# Patient Record
Sex: Female | Born: 1945
Health system: Southern US, Community
[De-identification: ages and names within clinical notes are randomized; demographics above are authoritative.]

## PROBLEM LIST (undated history)

## (undated) DIAGNOSIS — G8929 Other chronic pain: Secondary | ICD-10-CM

## (undated) DIAGNOSIS — N189 Chronic kidney disease, unspecified: Secondary | ICD-10-CM

## (undated) DIAGNOSIS — R42 Dizziness and giddiness: Secondary | ICD-10-CM

## (undated) DIAGNOSIS — K635 Polyp of colon: Secondary | ICD-10-CM

## (undated) DIAGNOSIS — K08109 Complete loss of teeth, unspecified cause, unspecified class: Secondary | ICD-10-CM

## (undated) DIAGNOSIS — Z8619 Personal history of other infectious and parasitic diseases: Secondary | ICD-10-CM

## (undated) DIAGNOSIS — M1990 Unspecified osteoarthritis, unspecified site: Secondary | ICD-10-CM

## (undated) DIAGNOSIS — M5440 Lumbago with sciatica, unspecified side: Secondary | ICD-10-CM

## (undated) DIAGNOSIS — Z87440 Personal history of urinary (tract) infections: Secondary | ICD-10-CM

## (undated) DIAGNOSIS — H9190 Unspecified hearing loss, unspecified ear: Secondary | ICD-10-CM

## (undated) DIAGNOSIS — E785 Hyperlipidemia, unspecified: Secondary | ICD-10-CM

## (undated) DIAGNOSIS — F1910 Other psychoactive substance abuse, uncomplicated: Secondary | ICD-10-CM

## (undated) DIAGNOSIS — Z972 Presence of dental prosthetic device (complete) (partial): Secondary | ICD-10-CM

## (undated) DIAGNOSIS — H269 Unspecified cataract: Secondary | ICD-10-CM

## (undated) DIAGNOSIS — IMO0002 Reserved for concepts with insufficient information to code with codable children: Secondary | ICD-10-CM

## (undated) DIAGNOSIS — K5909 Other constipation: Secondary | ICD-10-CM

## (undated) DIAGNOSIS — Z8719 Personal history of other diseases of the digestive system: Secondary | ICD-10-CM

## (undated) DIAGNOSIS — T7840XA Allergy, unspecified, initial encounter: Secondary | ICD-10-CM

## (undated) DIAGNOSIS — I10 Essential (primary) hypertension: Secondary | ICD-10-CM

## (undated) DIAGNOSIS — C801 Malignant (primary) neoplasm, unspecified: Secondary | ICD-10-CM

## (undated) DIAGNOSIS — N135 Crossing vessel and stricture of ureter without hydronephrosis: Secondary | ICD-10-CM

## (undated) DIAGNOSIS — Z8542 Personal history of malignant neoplasm of other parts of uterus: Secondary | ICD-10-CM

## (undated) DIAGNOSIS — Z973 Presence of spectacles and contact lenses: Secondary | ICD-10-CM

## (undated) HISTORY — PX: OTHER SURGICAL HISTORY: SHX169

## (undated) HISTORY — DX: Hyperlipidemia, unspecified: E78.5

## (undated) HISTORY — PX: LUMBAR SPINE SURGERY: SHX701

## (undated) HISTORY — PX: TOTAL KNEE ARTHROPLASTY: SHX125

## (undated) HISTORY — DX: Unspecified cataract: H26.9

## (undated) HISTORY — PX: ANTERIOR LUMBAR FUSION: SHX1170

## (undated) HISTORY — PX: ABDOMINAL HYSTERECTOMY: SHX81

## (undated) HISTORY — PX: KNEE ARTHROSCOPY: SUR90

## (undated) HISTORY — DX: Other psychoactive substance abuse, uncomplicated: F19.10

## (undated) HISTORY — PX: FOOT SURGERY: SHX648

## (undated) HISTORY — DX: Essential (primary) hypertension: I10

## (undated) HISTORY — DX: Reserved for concepts with insufficient information to code with codable children: IMO0002

## (undated) HISTORY — PX: EYE SURGERY: SHX253

## (undated) HISTORY — PX: CYSTOSCOPY WITH RETROGRADE PYELOGRAM, URETEROSCOPY AND STENT PLACEMENT: SHX5789

## (undated) HISTORY — PX: JOINT REPLACEMENT: SHX530

## (undated) HISTORY — DX: Unspecified osteoarthritis, unspecified site: M19.90

## (undated) HISTORY — PX: BACK SURGERY: SHX140

## (undated) HISTORY — PX: COLONOSCOPY: SHX174

---
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
## 1998-02-06 ENCOUNTER — Ambulatory Visit (HOSPITAL_COMMUNITY): Admission: RE | Admit: 1998-02-06 | Discharge: 1998-02-06 | Payer: Self-pay | Admitting: Orthopedic Surgery

## 1999-09-16 ENCOUNTER — Other Ambulatory Visit: Admission: RE | Admit: 1999-09-16 | Discharge: 1999-09-16 | Payer: Self-pay | Admitting: Family Medicine

## 2000-09-29 ENCOUNTER — Other Ambulatory Visit: Admission: RE | Admit: 2000-09-29 | Discharge: 2000-09-29 | Payer: Self-pay | Admitting: Family Medicine

## 2001-05-09 HISTORY — PX: BUNIONECTOMY: SHX129

## 2001-09-06 ENCOUNTER — Other Ambulatory Visit: Admission: RE | Admit: 2001-09-06 | Discharge: 2001-09-06 | Payer: Self-pay | Admitting: Dermatology

## 2001-10-09 ENCOUNTER — Other Ambulatory Visit: Admission: RE | Admit: 2001-10-09 | Discharge: 2001-10-09 | Payer: Self-pay | Admitting: Family Medicine

## 2001-12-06 ENCOUNTER — Ambulatory Visit (HOSPITAL_BASED_OUTPATIENT_CLINIC_OR_DEPARTMENT_OTHER): Admission: RE | Admit: 2001-12-06 | Discharge: 2001-12-06 | Payer: Self-pay | Admitting: Urology

## 2002-11-05 ENCOUNTER — Other Ambulatory Visit: Admission: RE | Admit: 2002-11-05 | Discharge: 2002-11-05 | Payer: Self-pay | Admitting: Family Medicine

## 2003-12-16 ENCOUNTER — Other Ambulatory Visit: Admission: RE | Admit: 2003-12-16 | Discharge: 2003-12-16 | Payer: Self-pay | Admitting: Family Medicine

## 2005-03-01 ENCOUNTER — Other Ambulatory Visit: Admission: RE | Admit: 2005-03-01 | Discharge: 2005-03-01 | Payer: Self-pay | Admitting: Family Medicine

## 2005-05-09 HISTORY — PX: CATARACT EXTRACTION W/ INTRAOCULAR LENS  IMPLANT, BILATERAL: SHX1307

## 2006-03-24 ENCOUNTER — Other Ambulatory Visit: Admission: RE | Admit: 2006-03-24 | Discharge: 2006-03-24 | Payer: Self-pay | Admitting: Family Medicine

## 2007-04-27 ENCOUNTER — Ambulatory Visit (HOSPITAL_BASED_OUTPATIENT_CLINIC_OR_DEPARTMENT_OTHER): Admission: RE | Admit: 2007-04-27 | Discharge: 2007-04-27 | Payer: Self-pay | Admitting: Orthopedic Surgery

## 2007-05-10 HISTORY — PX: TOTAL HIP ARTHROPLASTY: SHX124

## 2007-05-15 ENCOUNTER — Encounter: Admission: RE | Admit: 2007-05-15 | Discharge: 2007-05-22 | Payer: Self-pay | Admitting: Orthopedic Surgery

## 2008-01-28 ENCOUNTER — Ambulatory Visit: Payer: Self-pay | Admitting: *Deleted

## 2008-01-28 ENCOUNTER — Inpatient Hospital Stay (HOSPITAL_COMMUNITY): Admission: RE | Admit: 2008-01-28 | Discharge: 2008-01-31 | Payer: Self-pay | Admitting: Neurological Surgery

## 2008-02-09 ENCOUNTER — Inpatient Hospital Stay (HOSPITAL_COMMUNITY): Admission: EM | Admit: 2008-02-09 | Discharge: 2008-02-12 | Payer: Self-pay | Admitting: Emergency Medicine

## 2008-02-18 ENCOUNTER — Encounter: Payer: Self-pay | Admitting: Emergency Medicine

## 2008-02-18 ENCOUNTER — Inpatient Hospital Stay (HOSPITAL_COMMUNITY): Admission: AD | Admit: 2008-02-18 | Discharge: 2008-02-21 | Payer: Self-pay | Admitting: Urology

## 2008-03-21 ENCOUNTER — Ambulatory Visit (HOSPITAL_COMMUNITY): Admission: RE | Admit: 2008-03-21 | Discharge: 2008-03-21 | Payer: Self-pay | Admitting: Urology

## 2008-07-21 ENCOUNTER — Ambulatory Visit: Payer: Self-pay | Admitting: Gastroenterology

## 2008-07-30 ENCOUNTER — Ambulatory Visit: Payer: Self-pay | Admitting: Gastroenterology

## 2008-07-30 ENCOUNTER — Encounter: Payer: Self-pay | Admitting: Gastroenterology

## 2008-08-01 ENCOUNTER — Encounter: Payer: Self-pay | Admitting: Gastroenterology

## 2008-09-12 ENCOUNTER — Ambulatory Visit (HOSPITAL_COMMUNITY): Admission: AD | Admit: 2008-09-12 | Discharge: 2008-09-12 | Payer: Self-pay | Admitting: Urology

## 2008-12-04 ENCOUNTER — Ambulatory Visit (HOSPITAL_COMMUNITY): Admission: RE | Admit: 2008-12-04 | Discharge: 2008-12-04 | Payer: Self-pay | Admitting: Urology

## 2008-12-29 ENCOUNTER — Ambulatory Visit (HOSPITAL_BASED_OUTPATIENT_CLINIC_OR_DEPARTMENT_OTHER): Admission: RE | Admit: 2008-12-29 | Discharge: 2008-12-29 | Payer: Self-pay | Admitting: Urology

## 2009-03-11 ENCOUNTER — Inpatient Hospital Stay (HOSPITAL_COMMUNITY): Admission: RE | Admit: 2009-03-11 | Discharge: 2009-03-15 | Payer: Self-pay | Admitting: Urology

## 2009-06-03 ENCOUNTER — Ambulatory Visit (HOSPITAL_COMMUNITY): Admission: RE | Admit: 2009-06-03 | Discharge: 2009-06-03 | Payer: Self-pay | Admitting: Urology

## 2009-06-22 ENCOUNTER — Emergency Department (HOSPITAL_COMMUNITY): Admission: EM | Admit: 2009-06-22 | Discharge: 2009-06-22 | Payer: Self-pay | Admitting: Emergency Medicine

## 2009-09-15 ENCOUNTER — Inpatient Hospital Stay (HOSPITAL_COMMUNITY): Admission: RE | Admit: 2009-09-15 | Discharge: 2009-09-18 | Payer: Self-pay | Admitting: Orthopedic Surgery

## 2009-09-23 ENCOUNTER — Encounter: Admission: RE | Admit: 2009-09-23 | Discharge: 2009-12-22 | Payer: Self-pay | Admitting: Orthopedic Surgery

## 2009-10-05 ENCOUNTER — Emergency Department (HOSPITAL_COMMUNITY): Admission: EM | Admit: 2009-10-05 | Discharge: 2009-10-05 | Payer: Self-pay | Admitting: Emergency Medicine

## 2009-12-10 ENCOUNTER — Inpatient Hospital Stay (HOSPITAL_COMMUNITY): Admission: RE | Admit: 2009-12-10 | Discharge: 2009-12-15 | Payer: Self-pay | Admitting: Orthopedic Surgery

## 2010-04-06 ENCOUNTER — Ambulatory Visit (HOSPITAL_COMMUNITY)
Admission: RE | Admit: 2010-04-06 | Discharge: 2010-04-06 | Payer: Self-pay | Source: Home / Self Care | Admitting: Urology

## 2010-05-09 DIAGNOSIS — Z8542 Personal history of malignant neoplasm of other parts of uterus: Secondary | ICD-10-CM

## 2010-05-09 HISTORY — DX: Personal history of malignant neoplasm of other parts of uterus: Z85.42

## 2010-06-08 NOTE — Procedures (Signed)
Summary: Colonoscopy   Colonoscopy  Procedure date:  07/30/2008  Findings:      Location:  [facility].    Procedures Next Due Date:    Colonoscopy: 08/2018  COLONOSCOPY PROCEDURE REPORT  PATIENT:  [name], [name]  MR#:  [id number] BIRTHDATE:   Jan 20, 1946, 62 yrs. old   GENDER:   female  ENDOSCOPIST:   Vania Rea. Jarold Motto, MD, [facility] Referred by: Rudi Heap, M.D.  PROCEDURE DATE:  07/30/2008 PROCEDURE:  Colonoscopy with snare polypectomy ASA CLASS:   Class II INDICATIONS: history of polyps   MEDICATIONS:    Fentanyl 50 mcg IV, Versed 7 mg IV  DESCRIPTION OF PROCEDURE:   After the risks benefits and alternatives of the procedure were thoroughly explained, informed consent was obtained.  Digital rectal exam was performed and revealed no abnormalities.   The LB PCF-H180AL [id number] endoscope was introduced through the anus and advanced to the cecum, which was identified by both the appendix and ileocecal valve, without limitations.  The quality of the prep was excellent, using MoviPrep.  The instrument was then slowly withdrawn as the colon was fully examined. <<PROCEDUREIMAGES>>    <<OLD IMAGES>>  FINDINGS:  A diminutive polyp was found in the sigmoid colon. Multiple hyperplastic 1mm nodules in rectum c/w hyperplastic nodules. Polyp was snared without cautery. Retrieval was successful. snare polyp  This was otherwise a normal examination.   PERIANAL PARILLAE NOTED.  The scope was then withdrawn from the patient and the procedure completed.  COMPLICATIONS:   None  ENDOSCOPIC IMPRESSION:  1) Diminutive polyp in the sigmoid colon  2) Otherwise normal examination RECOMMENDATIONS:  1) Repeat colonoscopy in 5 years if polyp adenomatous; otherwise 10 years  REPEAT EXAM:   No   _______________________________ Vania Rea. Jarold Motto, MD, [facility]  CC: Rudi Heap, MD        REPORT OF SURGICAL PATHOLOGY   Case #: [id number] Patient Name: [name], [name]. Office Chart Number:  [id number]   MRN: [id number] Pathologist: Alden Server A. Delila Spence, MD DOB/Age  Sep 21, 1945 (Age: 37)    Gender: F Date Taken:  07/30/2008 Date Received: 07/30/2008   FINAL DIAGNOSIS   ***MICROSCOPIC EXAMINATION AND DIAGNOSIS***   SIGMOID COLON, POLYP:  HYPERPLASTIC POLYP.  NO ADENOMATOUS CHANGE OR MALIGNANCY IDENTIFIED.   gdt Date Reported:  07/31/2008     Alden Server A. Delila Spence, MD *** Electronically Signed Out By EAA ***      August 01, 2008 MRN: [id number]    ZEINA AKKERMAN [address] [place], [place]  [address]    Dear Ms. Hornbrook,  I am pleased to inform you that the colon polyp(s) removed during your recent colonoscopy was (were) found to be benign (no cancer detected) upon pathologic examination.  I recommend you have a repeat colonoscopy examination in 10 years to look for recurrent polyps, as having colon polyps increases your risk for having recurrent polyps or even colon cancer in the future.  Should you develop new or worsening symptoms of abdominal pain, bowel habit changes or bleeding from the rectum or bowels, please schedule an evaluation with either your primary care physician or with me.  Additional information/recommendations:  __ No further action with gastroenterology is needed at this time. Please      follow-up with your primary care physician for your other healthcare      needs.  __ Please call [phone] to schedule a return visit to review your      situation.  __ Please keep your follow-up visit as already  scheduled.  _x_ Continue treatment plan as outlined the day of your exam.  Please call us if you are having persistent problems or have questions about your condition that have not been fully answered at this time.  Sincerely,  Mardella Layman MD [facility]  This letter has been electronically signed by your physician.   This report was created from the original endoscopy report, which was reviewed and signed by the  above listed endoscopist.

## 2010-06-21 ENCOUNTER — Encounter (HOSPITAL_BASED_OUTPATIENT_CLINIC_OR_DEPARTMENT_OTHER)
Admission: RE | Admit: 2010-06-21 | Discharge: 2010-06-21 | Disposition: A | Discharge status: Home / Self Care | Payer: MEDICARE | Source: Ambulatory Visit | Attending: Orthopedic Surgery | Admitting: Orthopedic Surgery

## 2010-06-21 DIAGNOSIS — Z01812 Encounter for preprocedural laboratory examination: Secondary | ICD-10-CM | POA: Insufficient documentation

## 2010-06-21 LAB — BASIC METABOLIC PANEL
GFR calc non Af Amer: >60 mL/min (ref >60)
Potassium: 4.8 mEq/L (ref 3.5–5.1)
Sodium: 142 mEq/L (ref 135–145)

## 2010-06-23 ENCOUNTER — Ambulatory Visit (HOSPITAL_BASED_OUTPATIENT_CLINIC_OR_DEPARTMENT_OTHER)
Admission: RE | Admit: 2010-06-23 | Discharge: 2010-06-23 | Disposition: A | Discharge status: Home / Self Care | Payer: Medicare Other | Source: Ambulatory Visit | Attending: Orthopedic Surgery | Admitting: Orthopedic Surgery

## 2010-06-23 DIAGNOSIS — M6240 Contracture of muscle, unspecified site: Secondary | ICD-10-CM | POA: Insufficient documentation

## 2010-06-23 DIAGNOSIS — M19079 Primary osteoarthritis, unspecified ankle and foot: Secondary | ICD-10-CM | POA: Insufficient documentation

## 2010-06-23 DIAGNOSIS — I10 Essential (primary) hypertension: Secondary | ICD-10-CM | POA: Insufficient documentation

## 2010-06-23 DIAGNOSIS — Z01812 Encounter for preprocedural laboratory examination: Secondary | ICD-10-CM | POA: Insufficient documentation

## 2010-07-23 LAB — PROTIME-INR
INR: 1.13 (ref 0.00–1.49)
Prothrombin Time: 14.7 seconds (ref 11.6–15.2)
Prothrombin Time: 15.3 seconds — ABNORMAL HIGH (ref 11.6–15.2)
Prothrombin Time: 22.2 seconds — ABNORMAL HIGH (ref 11.6–15.2)
Prothrombin Time: 22.6 seconds — ABNORMAL HIGH (ref 11.6–15.2)

## 2010-07-23 LAB — CBC
MCH: 32.1 pg (ref 26.0–34.0)
MCH: 32.3 pg (ref 26.0–34.0)
MCH: 32.6 pg (ref 26.0–34.0)
MCHC: 34.4 g/dL (ref 30.0–36.0)
MCHC: 34.5 g/dL (ref 30.0–36.0)
MCHC: 35.1 g/dL (ref 30.0–36.0)
Platelets: 245 10*3/uL (ref 150–400)
Platelets: 249 10*3/uL (ref 150–400)
Platelets: 256 10*3/uL (ref 150–400)
RBC: 2.87 MIL/uL — ABNORMAL LOW (ref 3.87–5.11)
RBC: 2.93 MIL/uL — ABNORMAL LOW (ref 3.87–5.11)

## 2010-07-23 LAB — POCT I-STAT 4, (NA,K, GLUC, HGB,HCT)
Hemoglobin: 13.6 g/dL (ref 12.0–15.0)
Potassium: 3.9 mEq/L (ref 3.5–5.1)

## 2010-07-23 LAB — TYPE AND SCREEN: ABO/RH(D): AB POS

## 2010-07-24 LAB — BASIC METABOLIC PANEL
CO2: 29 mEq/L (ref 19–32)
GFR calc non Af Amer: >60 mL/min (ref >60)
Glucose, Bld: 112 mg/dL — ABNORMAL HIGH (ref 70–99)
Potassium: 3.9 mEq/L (ref 3.5–5.1)
Sodium: 140 mEq/L (ref 135–145)

## 2010-07-25 NOTE — Op Note (Signed)
NAMESALLIE, STARON NO.:  [id number]  MEDICAL RECORD NO.:  [id number]          PATIENT TYPE:  OUT  LOCATION:  XRAY                         FACILITY:  [facility]  PHYSICIAN:  Leonides Grills, M.D.     DATE OF BIRTH:  02-05-1946  DATE OF PROCEDURE:  06/23/2010 DATE OF DISCHARGE:  04/06/2010                              OPERATIVE REPORT   PREOPERATIVE DIAGNOSES: 1. 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 2. Left second tarsometatarsal joint arthritis. 3. Left high gastroc.  POSTOPERATIVE DIAGNOSES: 1. 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 2. Left second tarsometatarsal joint arthritis.  OPERATIONS: 1. Left first tarsometatarsal joint fusion with osteotomy. 2. Left second tarsometatarsal fusion without osteotomy. 3. Left gastroc slide.  ANESTHESIA:  Popliteal block with sedation under ultrasound guidance.  SURGEON:  Leonides Grills, MD  ASSISTANT:  Richardean Canal, PA-C.  TOURNIQUET TIME:  An hour.  COMPLICATIONS:  None.  DISPOSITION:  Stable to PR.  IMPLANT:  Synthes 3.5 mm stainless steel screw x2.  INDICATIONS:  A 65 year old female with longstanding left medial midfoot pain that was interfering with her life to the point where she could not do what she wants to do despite conservative management.  She was consented for the above procedure.  All risks of infection, vessel, injury, nonunion, malunion, hardware irritation, hardware failure, persistent pain, worse pain, prolonged recovery, stiffness, arthritis, wound healing problems, DVT, PE, and possibility that we may need to diffuse the left third TMT joint if there was arthritis evident on tangential x-rays intraoperatively were all explained.  Questions were encouraged and answered.  OPERATION:  The patient was brought to the operating room, placed in supine position.  After adequate general anesthesia was administered  as well as Ancef 1 g IV piggyback.  Left lower extremity was then prepped and draped in a sterile manner over a proximally placed thigh tourniquet.  We started the procedure with longitudinal incision over the medial aspect gastrocnemius muscle tendinous junction.  Dissection was carried down through the skin.  Hemostasis was obtained.  Fascia was opened line with the incision.  Conjoined region was then developed including the gastroc and soleus.  Soft tissue was elevated off the posterior aspect of the gastrocnemius.  Sural nerve was identified, protected posteriorly throughout the case.  Gastrocnemius then released with curved Mayo scissors.  This had an excellent release, tight gastroc.  The area was copiously irrigated with normal saline.  Subcu was closed with 3-0 Vicryl.  Skin was closed 4-0 with Monocryl subcuticular stitch.  Steri-Strips were applied.  Limb was then gravity exsanguinated.  Tourniquet was elevated to 290 mmHg.  A longitudinal incision midline between EHL and EHB was then made.  Dissection was carried down through skin.  Hemostasis was obtained.  EHL and EHB were protected within their respective sheaths.  Soft tissue was elevated off the dorsal aspect of first and second TMT joints.  Both joints were entered.  For the first TMT joint due to the amount of metatarsal primus varus, we then performed a closing wedge osteotomy through the first  TMT joint.  By using a sagittal saw, we osteotomized the distal aspect of the medial cuneiform and the base of first metatarsal.  We then took a small sliver off the lateral aspect of the base of first metatarsal as well.  Once this was done, we then reduced the first TMT joint and then fixed this with a 2.0-mm K-wire.  This held the joint in excellent position.  We then entered the second TMT joint, and again this was bone- on-bone arthritis.  We then removed the remaining cartilage with a curved quarter-inch osteotome as well  as a curette and synovectomy rongeur.  Once this was completely denuded of cartilage and soft tissue, the soft tissue and the remaining cartilage was removed, and multiple 2- mm drill holes were placed on either side of the joint.  We did not osteotomize the second TMT joint.  We also obtained a tangential x-ray of the third TMT joint, and found that there was still excellent cartilage and only a small spur dorsally.  We chose not to do a separate incision and fusion of this joint at this time.  We then used a bur and created a notch at the base of first metatarsal.  We then placed a 3.5- mm fully-threaded cortical lag screw using a 3.5 and 2.5 mm drill hole respectively.  This had an excellent compression and maintenance of the proper position of the first TMT joint.  Then with a two-point reduction clamp, we then reduced the second TMT joint by compressing this and then through a separate incision over the medial cuneiform, a 3.5-mm fully- threaded cortical set screw was placed using a 2.5-mm drill hole respectively.  This had excellent purchase and maintenance of the compression across the second TMT joint.  Stress strain relieving local bone graft obtained throughout the case.  It was then placed in the first and second TMT joint from the drill bits obtained throughout the case.  We obtained stress x-rays; AP, lateral, and oblique planes showed no gross motion fixation, proposition, and excellent alignment as well. Tourniquet was deflated.  Hemostasis was obtained.  There was no pulsatile bleeding.  Subcu was closed with 3-0 Vicryl.  Skin was closed with 4-0 nylon.  At the end of the case, there was slight extension of the great toe MTP joint, but when comparing the contralateral side, it had the exact same appearance.  When we did place pressure on the plantar aspect of foot, the toe corrected beautifully.  This is symmetric to the contralateral side.  Sterile dressing was  applied. Modified Jones dressing was applied with the ankle in neutral dorsiflexion.  The patient was stable to the PR.     Leonides Grills, M.D.     PB/MEDQ  D:  06/23/2010  T:  06/24/2010  Job:  [id number]  Electronically Signed by Leonides Grills M.D. on 07/25/2010 08:30:55 AM

## 2010-07-27 LAB — CBC
HCT: 28.2 % — ABNORMAL LOW (ref 36.0–46.0)
HCT: 32.8 % — ABNORMAL LOW (ref 36.0–46.0)
Hemoglobin: 9.5 g/dL — ABNORMAL LOW (ref 12.0–15.0)
Hemoglobin: 9.6 g/dL — ABNORMAL LOW (ref 12.0–15.0)
Platelets: 253 10*3/uL (ref 150–400)
RBC: 2.94 MIL/uL — ABNORMAL LOW (ref 3.87–5.11)
RBC: 2.95 MIL/uL — ABNORMAL LOW (ref 3.87–5.11)
WBC: 10.9 10*3/uL — ABNORMAL HIGH (ref 4.0–10.5)
WBC: 11.5 10*3/uL — ABNORMAL HIGH (ref 4.0–10.5)
WBC: 12.3 10*3/uL — ABNORMAL HIGH (ref 4.0–10.5)

## 2010-07-27 LAB — BASIC METABOLIC PANEL
BUN: 8 mg/dL (ref 6–23)
CO2: 27 mEq/L (ref 19–32)
CO2: 29 mEq/L (ref 19–32)
Calcium: 8.3 mg/dL — ABNORMAL LOW (ref 8.4–10.5)
Calcium: 8.4 mg/dL (ref 8.4–10.5)
Calcium: 9.5 mg/dL (ref 8.4–10.5)
Chloride: 102 mEq/L (ref 96–112)
GFR calc Af Amer: >60 mL/min (ref >60)
GFR calc Af Amer: >60 mL/min (ref >60)
GFR calc non Af Amer: >60 mL/min (ref >60)
GFR calc non Af Amer: >60 mL/min (ref >60)
Potassium: 3.7 mEq/L (ref 3.5–5.1)
Sodium: 138 mEq/L (ref 135–145)
Sodium: 138 mEq/L (ref 135–145)

## 2010-07-27 LAB — PROTIME-INR
INR: 1.11 (ref 0.00–1.49)
INR: 1.13 (ref 0.00–1.49)
INR: 1.45 (ref 0.00–1.49)
INR: 1.6 — ABNORMAL HIGH (ref 0.00–1.49)
Prothrombin Time: 14.2 seconds (ref 11.6–15.2)

## 2010-07-27 LAB — APTT: aPTT: 27 seconds (ref 24–37)

## 2010-07-28 LAB — CBC
HCT: 39.5 % (ref 36.0–46.0)
MCV: 91.3 fL (ref 78.0–100.0)
RBC: 4.33 MIL/uL (ref 3.87–5.11)
WBC: 15.9 10*3/uL — ABNORMAL HIGH (ref 4.0–10.5)

## 2010-07-28 LAB — URINALYSIS, ROUTINE W REFLEX MICROSCOPIC
Glucose, UA: NEGATIVE mg/dL
pH: 6.5 (ref 5.0–8.0)

## 2010-07-28 LAB — BASIC METABOLIC PANEL
Chloride: 104 mEq/L (ref 96–112)
GFR calc Af Amer: >60 mL/min (ref >60)
Potassium: 3.8 mEq/L (ref 3.5–5.1)

## 2010-07-28 LAB — DIFFERENTIAL
Eosinophils Absolute: 0 10*3/uL (ref 0.0–0.7)
Eosinophils Relative: 0 % (ref 0–5)
Lymphs Abs: 1.2 10*3/uL (ref 0.7–4.0)
Monocytes Relative: 3 % (ref 3–12)
Neutrophils Relative %: 90 % — ABNORMAL HIGH (ref 43–77)

## 2010-07-28 LAB — URINE CULTURE

## 2010-07-28 LAB — URINE MICROSCOPIC-ADD ON

## 2010-08-11 LAB — BASIC METABOLIC PANEL
BUN: 6 mg/dL (ref 6–23)
BUN: 7 mg/dL (ref 6–23)
CO2: 30 mEq/L (ref 19–32)
Chloride: 103 mEq/L (ref 96–112)
Chloride: 105 mEq/L (ref 96–112)
Creatinine, Ser: 0.68 mg/dL (ref 0.4–1.2)
GFR calc non Af Amer: >60 mL/min (ref >60)
Glucose, Bld: 128 mg/dL — ABNORMAL HIGH (ref 70–99)
Glucose, Bld: 143 mg/dL — ABNORMAL HIGH (ref 70–99)
Potassium: 4.1 mEq/L (ref 3.5–5.1)

## 2010-08-11 LAB — URINE CULTURE: Special Requests: NEGATIVE

## 2010-08-11 LAB — ABO/RH: ABO/RH(D): AB POS

## 2010-08-11 LAB — HEMOGLOBIN AND HEMATOCRIT, BLOOD
HCT: 30 % — ABNORMAL LOW (ref 36.0–46.0)
HCT: 31.6 % — ABNORMAL LOW (ref 36.0–46.0)
Hemoglobin: 10.3 g/dL — ABNORMAL LOW (ref 12.0–15.0)
Hemoglobin: 14.3 g/dL (ref 12.0–15.0)

## 2010-08-11 LAB — TYPE AND SCREEN: Antibody Screen: NEGATIVE

## 2010-08-12 LAB — BASIC METABOLIC PANEL
Calcium: 9.1 mg/dL (ref 8.4–10.5)
Creatinine, Ser: 0.71 mg/dL (ref 0.4–1.2)
GFR calc Af Amer: >60 mL/min (ref >60)
GFR calc non Af Amer: >60 mL/min (ref >60)
Sodium: 140 mEq/L (ref 135–145)

## 2010-08-12 LAB — CBC
Hemoglobin: 14.4 g/dL (ref 12.0–15.0)
RBC: 4.35 MIL/uL (ref 3.87–5.11)

## 2010-08-14 LAB — POCT HEMOGLOBIN-HEMACUE: Hemoglobin: 13.8 g/dL (ref 12.0–15.0)

## 2010-08-24 ENCOUNTER — Ambulatory Visit: Payer: MEDICARE | Admitting: Physical Therapy

## 2010-08-27 ENCOUNTER — Ambulatory Visit: Payer: Medicare Other | Attending: Orthopedic Surgery | Admitting: Physical Therapy

## 2010-08-27 DIAGNOSIS — M25676 Stiffness of unspecified foot, not elsewhere classified: Secondary | ICD-10-CM | POA: Insufficient documentation

## 2010-08-27 DIAGNOSIS — M25579 Pain in unspecified ankle and joints of unspecified foot: Secondary | ICD-10-CM | POA: Insufficient documentation

## 2010-08-27 DIAGNOSIS — R269 Unspecified abnormalities of gait and mobility: Secondary | ICD-10-CM | POA: Insufficient documentation

## 2010-08-27 DIAGNOSIS — M25673 Stiffness of unspecified ankle, not elsewhere classified: Secondary | ICD-10-CM | POA: Insufficient documentation

## 2010-08-27 DIAGNOSIS — R5381 Other malaise: Secondary | ICD-10-CM | POA: Insufficient documentation

## 2010-08-27 DIAGNOSIS — IMO0001 Reserved for inherently not codable concepts without codable children: Secondary | ICD-10-CM | POA: Insufficient documentation

## 2010-08-31 ENCOUNTER — Ambulatory Visit: Payer: Medicare Other | Admitting: Physical Therapy

## 2010-09-02 ENCOUNTER — Ambulatory Visit: Payer: Medicare Other | Admitting: Physical Therapy

## 2010-09-08 ENCOUNTER — Ambulatory Visit: Payer: Medicare Other | Attending: Orthopedic Surgery | Admitting: Physical Therapy

## 2010-09-08 DIAGNOSIS — M25579 Pain in unspecified ankle and joints of unspecified foot: Secondary | ICD-10-CM | POA: Insufficient documentation

## 2010-09-08 DIAGNOSIS — R269 Unspecified abnormalities of gait and mobility: Secondary | ICD-10-CM | POA: Insufficient documentation

## 2010-09-08 DIAGNOSIS — IMO0001 Reserved for inherently not codable concepts without codable children: Secondary | ICD-10-CM | POA: Insufficient documentation

## 2010-09-08 DIAGNOSIS — M25676 Stiffness of unspecified foot, not elsewhere classified: Secondary | ICD-10-CM | POA: Insufficient documentation

## 2010-09-08 DIAGNOSIS — R5381 Other malaise: Secondary | ICD-10-CM | POA: Insufficient documentation

## 2010-09-08 DIAGNOSIS — M25673 Stiffness of unspecified ankle, not elsewhere classified: Secondary | ICD-10-CM | POA: Insufficient documentation

## 2010-09-10 ENCOUNTER — Ambulatory Visit: Payer: Medicare Other | Admitting: *Deleted

## 2010-09-14 ENCOUNTER — Ambulatory Visit: Payer: Medicare Other | Admitting: *Deleted

## 2010-09-20 ENCOUNTER — Ambulatory Visit: Payer: Medicare Other | Admitting: Physical Therapy

## 2010-09-21 NOTE — Op Note (Signed)
[name], [name]:  [id number]   MEDICAL RECORD NO.:  [id number]          PATIENT TYPE:  INP   LOCATION:  3108                         FACILITY:  [facility]   PHYSICIAN:  Balinda Quails, M.D.    DATE OF BIRTH:  [birth year]   DATE OF PROCEDURE:  01/28/2008  DATE OF DISCHARGE:                               OPERATIVE REPORT   SURGEON:  Balinda Quails, MD   CO-SURGEON:  Stefani Dama, MD   ASSISTANT:  Larina Earthly, MD.   ANESTHETIC:  General endotracheal.   ANESTHESIOLOGIST:  Bedelia Person, MD   PREOPERATIVE DIAGNOSIS:  L2-L3, L3-L4 degenerative disk disease.   POSTOPERATIVE DIAGNOSIS:  L2-L3, L3-L4 degenerative disk disease.   PROCEDURE:  L2-L3, L3-L4 anterior lumbar interbody fusion (left).   CLINICAL NOTE:  [name] is a 65 year old female scheduled at  this time to undergo 2-level at leftward L2-L3 and L3-L4.  The patient  was seen and evaluated preoperatively in the holding area.  Dorsalis  pedis pulses 2+ bilaterally.  No history of DVT or pulmonary embolus.  No contraindications to surgery.   Details of the operative procedures were reviewed with the patient.  Potential risks were also reviewed.  These include but are not limited  to bleeding, vessel injury, limb ischemia, ureter injury, nerve injury,  DVT, pulmonary embolus, incisional hernia, or other major complications.   OPERATIVE PROCEDURE:  The patient was brought to the operating room in  stable hemodynamic condition.  Placed under general endotracheal  anesthesia.  Foley catheter, central venous catheter, and arterial line  in place.   Pulse oximetry was placed on the left foot.   A longitudinal skin incision was made at the [facility] site in the left  midabdomen along the paramedian line.  Dissection was carried through  the subcutaneous tissue with electrocautery.  The left anterior rectus  sheath was incised longitudinally.  The rectus muscle was mobilized  medially.  The  posterior rectus sheath was incised laterally and the  retroperitoneal space was entered.  The peritoneum pushed off the  posterior rectus sheath, which was incised longitudinally.  Small rents  were made in the peritoneum, which were closed with running 3-0 Vicryl  suture.   The peritoneal contents were rotated anteriorly and the left ureter was  rotated with the peritoneal contents.  The left psoas muscle and  genitofemoral nerve were preserved.   The L3-L4 disk was verified with fluoroscopy and a spinal needle.   The L4 and L3 segmental vessels were ligated with clips and divided.  The left common iliac vein was somewhat stuck to the previous posterior  fusion at L4-L5.  This was mobilized off the L4 vertebra and retracted  medially and the iliolumbar vein was ligated with clips and divided.  The left L5 nerve root was identified and preserved.   The vessels were rotated medially and the L3 segmental vessels were  divided along with the L2 segmental vessels.  This allowed full  mobilization of the common iliac artery and terminal aorta, which were  mobilized to the right.  The left common iliac  vein was also mobilized  to the right.   The L2-L3 and L3-L4 disks were fully exposed from left to right with  blunt dissection.   Using a [employer] retractor system, reverse lip blades were then  placed on the lateral margins of L2-L3 bilaterally.  Malleable  retractors were placed inferiorly and superiorly.  Dr. Danielle Dess then  completed L2-L3 on the left.   The retractors were then replaced in a similar fashion to expose L3-L4.  All L2-L4 on the left completed.   Upon completion of the 2 level on left, all retractors were removed.  X-  ray was obtained to make sure there were no retained instruments or  sponges.   Pulse oximetry returned to normal on the left foot.   The anterior rectus sheath was then closed with running 0 Vicryl suture.  Subcutaneous tissue was closed with 2  layers of running 2-0 Vicryl  suture and 1 layer of running 3-0 Vicryl suture.  Skin was closed with 4-  0 Monocryl.  Dermabond was applied.   The patient tolerated the procedure well.  No apparent complications.  Transferred to the recovery room in stable condition.      Balinda Quails, M.D.  Electronically Signed     PGH/MEDQ  D:  01/28/2008  T:  01/29/2008  Job:  [id number]

## 2010-09-21 NOTE — Op Note (Signed)
NAMEEVONA, WESTRA             ACCOUNT NO.:  [id number]   MEDICAL RECORD NO.:  [id number]          PATIENT TYPE:  INP   LOCATION:  3108                         FACILITY:  [facility]   PHYSICIAN:  Stefani Dama, M.D.  DATE OF BIRTH:  [birth year]   DATE OF PROCEDURE:  01/28/2008  DATE OF DISCHARGE:                               OPERATIVE REPORT   PREOPERATIVE DIAGNOSIS:  Lumbar spondylosis and stenosis L2-3 and L3-4,  status post arthrodesis L4-L5.   POSTOPERATIVE DIAGNOSIS:  Lumbar spondylosis and stenosis L2-3 and L3-4  status post arthrodesis L4-L5.   PROCEDURE:  Anterior lumbar decompression, L2-3 and L3-4 arthrodesis  with allograft and structural PEEK spacer and plate fixation.   SURGEON:  Stefani Dama, MD   APPROACH AND CLOSURE:  1. Balinda Quails, MD  2. Larina Earthly, MD   INDICATIONS:  [name] is a 65 year old individual who has had  significant difficulties with lumbar spondylitic disease.  Twelve years  ago, she underwent surgical decompression, arthrodesis at the level of  L4-L5 with a Ray cage technique.  She did well until the last year or  two where she started developing increasing symptoms of lumbar spinal  stenosis and radiculopathy.  She was advised regarding surgical  decompression, arthrodesis and is now being undertaken at the L2-3 and  L3-4 levels.   PROCEDURE:  The patient was brought to the operating room supine on the  stretcher.  After smooth induction of general endotracheal anesthesia,  her abdomen prepped with alcohol and DuraPrep and draped in sterile  fashion.  Fluoroscopically, L2-3 and L3-4 were localized on the surface  of the skin.  A vertical incision was made by Dr. Madilyn Fireman on the left side  of the midline.  Once the exposure was obtained in the retroperitoneal  space with retractors being placed at L2-L3, I performed an anterior  diskectomy opening anterior longitudinal ligament and removing the disk  space with series of  curettes and rongeurs, eventually removing all the  cartilaginous endplate from side to side and front to back.  In the  lateral recesses posteriorly, there was noted to be some substantial  disk protrusion.  This was cleaned in the subligamentous fashion.  Central portion of the ligament was noted to be calcified and this was  taken up with 2 mm Kerrison punch after carefully drilling the posterior  edge.  Some epidural venous bleeding was encountered and this was  controlled with some bipolar cautery and some small pledgets of Gelfoam  soaked in thrombin which were later irrigated away.  However, we did  need to leave some pledgets over the epidural veins in this area as  there was continued bleeding.  Once the space was decompressed and the  endplates were secured, the interspace was sized for appropriate size  spacer and it was felt that a 14 mm, 30 x 30 mm size spacer with 6  degrees lordosis would fit well into this interval.  This was then  filled with a combination of Vitoss bone pack which was soaked in the  patient's bone marrow that was obtained from  the endplate drilling.  This was also mixed with a half of a piece of a extra small infuse.  The  spacer was placed into the interspace under fluoroscopic guidance and  then a medium sized nail was placed into the upper portion of the spacer  and a small size nail was placed into this lower portion of spacer.  Attention was then turned to L3-L4 where similar decompression was then  undertaken at the L3-L4 space.  Here, there was noted to be substantial  subligamentous disk herniation on the left side.  The ligament was taken  up and 2-3 mm Kerrison punch was used then to clear the inferior  endplates.  Some calcium was done to the posterior longitudinal  ligament.  Once this area was decompressed, hemostasis was obtained in a  similar fashion and then the interspace was sized for a similar size  spacer measuring 30 x 33 mm in size  with 6 degrees lordosis, 14 mm in  height.  Small nail was placed into the superior endplate and the medium-  sized nail was placed in the inferior endplate.  Once this was secured,  hemostasis in the soft tissues was checked.  Retractors were removed.  Final radiographs were obtained and the procedure was then closed by Dr.  Madilyn Fireman.      Stefani Dama, M.D.  Electronically Signed     HJE/MEDQ  D:  01/28/2008  T:  01/29/2008  Job:  [id number]

## 2010-09-21 NOTE — Discharge Summary (Signed)
NAMEFLOREEN, TEEGARDEN             ACCOUNT NO.:  [id number]   MEDICAL RECORD NO.:  [id number]          PATIENT TYPE:  INP   LOCATION:  1410                         FACILITY[facility]   PHYSICIAN:  Excell Seltzer. Annabell Howells, M.D.    DATE OF BIRTH:  June 13, 1945   DATE OF ADMISSION:  02/18/2008  DATE OF DISCHARGE:  02/21/2008                               DISCHARGE SUMMARY   Briefly, Ms. Hollinsworth is a 65 year old white female who had a left  anterior diskectomy on February 05, 2008 by Dr. Madilyn Fireman and Dr. Danielle Dess.  She had presented to the emergency room twice with left lower quadrant  pain and has had CT scans on October 3 and October 12 that demonstrated  a fluid collection in the left retroperitoneum at the surgical level and  left hydronephrosis.  On her visit on October 12, she was found to be  febrile with a urinary tract infection.  It was felt that she would  require admission for drainage of her obstructed kidney with concerns  for possible ureteral injury.   PAST MEDICAL HISTORY:  Pertinent for arthritis, recent back surgery and  a prior sling procedure in 2005.   ADMISSION MEDICATIONS:  Colace, Percocet and valium.   ALLERGIES:  No drug allergies.   For additional details from the history and physical, please see the  attached office note.   ACCESSORY CLINICAL INFORMATION:  The CT results are noted above.  She  has moderate left hydronephrosis with fluid collection in the left  retroperitoneum at the surgical level with concern for possible ureteral  injury.  Her admission labs demonstrated a white count of 11,900,  hemoglobin 12.3.  Urinalysis with large leukocyte esterase and nitrite  negative, large blood, too numerous to count white cells, too numerous  to count red cells, many bacteria.  Basic metabolic profile was  remarkable only for a mild elevation in glucose of 167 __________.  Blood cultures were negative __________.  Urine culture grew an E. coli  sensitive to all attempted  antibiotics with the exception of ampicillin.   HOSPITAL COURSE:  On the day of admission, the patient was started on IV  Zosyn and was set up for percutaneous drainage of the left kidney that  was done in interventional radiology.  She spiked a temperature to  102.6.  Vancomycin was added to her antibiotic coverage at that point as  her culture sensitivity is not back.  On October 13, she was taken back  to the radiology where she underwent CT aspiration of her left  retroperitoneal fluid collection which returned 140 cc of clear fluid.  This was sent for culture and creatinine.  The creatinine was 0.6,  consistent with serum and the body fluid culture was negative.   On 10/14, the culture returned with sensitivities and her T-max is down  to 100.  She was changed to p.o. Cipro and the Zosyn and vancomycin were  stopped.  She was also sent back to interventional radiology where a  percutaneous tube was replaced with an antegrade stent.   On October 15, she was afebrile, had no further  flank pain and was felt  to be ready for discharge.  She did have a repeat CBC on October 14 that  demonstrated declining white count to 5.3, her hemoglobin was 10.5.  Comprehensive metabolic profile on that day revealed normal electrolytes  with a glucose of 120.  Her total protein was 5.5, albumin was 2.8,  calcium was 8.3, which were slightly low.   She was discharged home with a final diagnosis of a retroperitoneal  lymphocele with left ureteral obstruction and pyelonephritis.  There  were no complications during her admission.   DISCHARGE MEDICATIONS:  1. Cipro 500 mg p.o. b.i.d. #14.  2. She has Percocet and valium at home.   She will follow up with me in one week for reevaluation and will have a  repeat CT scan performed in 3-4 weeks with possible stent removal if the  lymphocele remains decompressed.   DISPOSITION:  Home.   PROGNOSIS:  Good.   CONDITION:  Improved.      Excell Seltzer.  Annabell Howells, M.D.  Electronically Signed     JJW/MEDQ  D:  02/21/2008  T:  02/21/2008  Job:  [id number]   cc:   Stefani Dama, M.D.  Fax: [phone]   P. Liliane Bade, M.D.  [address]  [place]  [place] [address]   Ernestina Penna, M.D.  Fax: [phone]

## 2010-09-21 NOTE — H&P (Signed)
NAMECLEMENCE, [name]             ACCOUNT NO.:  [id number]   MEDICAL RECORD NO.:  [id number]          PATIENT TYPE:  INP   LOCATION:  1824                         FACILITY:  [facility]   PHYSICIAN:  Coletta Memos, M.D.     DATE OF BIRTH:  1945-11-21   DATE OF ADMISSION:  02/09/2008  DATE OF DISCHARGE:                              HISTORY & PHYSICAL   ADMITTING DIAGNOSES:  1. Left flank pain.  2. Ileus.   INDICATIONS:  [name] is a 65 year old woman who is a patient of Dr.  Barnett Abu, my partner, who performed an anterior L2-3, L3-4  arthrodesis on January 28, 2008.  She had an anterior retroperitoneal  approach performed by Dr. Madilyn Fireman.  She says that she had been doing well  after discharge on January 31, 2008, until earlier this week.  She  started having pain may be Tuesday by her account, in the left side.  It  would wax and wane, but over the course of the week became more severe.  The pain is not necessarily persistent but enough that she came to the  emergency room at approximately 3 o'clock on the morning of admission.  She did not contact Dr. Danielle Dess.  She did not call our office.  She has  been taking Percocet and Valium.   She has not noticed the pain worsening with bowel movements.  Her bowel  and bladder function have been normal.  She did have an episode of  emesis prior to admission on Friday night.  She has not had a fever.  She says the last time she did vomit was at 1700 hours on February 08, 2008.  She could take Phenergan at 0145 hours on February 09, 2008.   [name] related her pain to be 10/10 at its worst.  York Spaniel it was  aggravated by movement.  She had been given IV Dilaudid prior to my  arrival in the emergency room.  The vascular surgeons have also been  contacted and have performed a consultation.   She currently is taking Phenergan, Valium, and Percocet.   She has no known drug allergies.   She has undergone a cystoscopy for urethral polyps.   She has had a right  knee arthroscopy for meniscal tear and pain and she has undergone a  posterior lumbar fusion at L4-5 with Ray cages, and she has had an ALIF  done at L2-3, L3-4.   Past medical history includes back pain and urethral polyps.   PHYSICAL EXAMINATION:  VITAL SIGNS:  Temperature 98.5, pulse 73,  respiratory rate 18, and blood pressure is 135/57.  GENERAL:  She is alert, oriented x4, and answering all questions  appropriately, lying in a stretcher in the emergency room.  NEUROLOGIC:  5/5 strength in the upper and lower extremities.  Pupils  are equal, round, and reactive to light.  She has symmetric facies and  symmetric facial movements.  Hearing is intact to voice.  Tongue  protrusion in midline.  Her uvula elevates in midline.  Shoulder shrug  is normal.  She has intact light touch.  She  also has intact  proprioception.  Muscle tone, bulk, and coordination appear to be  normal.  I did not have Ms. Pescador walk.  LUNGS:  Clear.  ABDOMEN:  Soft, mildly tender on the left side.  SKIN:  The wound is clean and dry.  No signs of infection.  EXTREMITIES:  No clubbing, cyanosis, or edema.  Pulses are strong in the  upper and lower extremities.   CT of the abdomen and pelvis was ordered.  Her hardware is in place.  She does have free fluid, retroperitoneal space on the left side along  with dilated loops of bowel.   After speaking with Dr. Darrick Penna, he felt it best that Ms. Kiernan be  admitted for IV fluids for hydration.  We will keep her n.p.o.  We will  just move her along slowly with her diet.  She will be able to take her  medications.  She actually appears to be more comfortable after the  Dilaudid.           ______________________________  Coletta Memos, M.D.     KC/MEDQ  D:  02/09/2008  T:  02/09/2008  Job:  [id number]

## 2010-09-21 NOTE — Op Note (Signed)
[name], [name]             ACCOUNT NO.:  [id number]   MEDICAL RECORD NO.:  [id number]          PATIENT TYPE:  AMB   LOCATION:  NESC                         FACILITY:  [facility]   PHYSICIAN:  Heloise Purpura, MD      DATE OF BIRTH:  [birth year]   DATE OF PROCEDURE:  12/29/2008  DATE OF DISCHARGE:                               OPERATIVE REPORT   PREOPERATIVE DIAGNOSES:  1. Left flank pain.  2. Left hydronephrosis with probable ureteral stricture.   POSTOPERATIVE DIAGNOSES:  1. Left flank pain.  2. Left ureteral stricture.   SURGEON:  Heloise Purpura, MD.   PROCEDURES:  1. Cystoscopy.  2. Left retrograde pyelography with interpretation.  3. Left ureteroscopy.  4. Balloon dilation of left ureteral stricture.  5. Left ureteral stent placement (8 x 24).   ANESTHESIA:  General.   COMPLICATIONS:  None.   INDICATIONS:  [name] is a 65 year old female who is status post a  prior anterior lumbar decompression that was performed via an anterior  retroperitoneal approach.  She presented postoperatively with  hydronephrosis and fever and was found to have a fluid collection as  well as hydronephrosis.  She was evaluated and was felt that she did  have left ureteral obstruction as a result of what appeared to be a  lymphocele.  She was initially managed with stent placement and this was  subsequently removed.  However, she did have recurrent pyelonephritis  following stent removal requiring further evaluation.  She recently  underwent a renogram which demonstrated preserved renal function and no  severe obstruction of the left kidney.  Based on the fact that she had  been asymptomatic, it was decided to proceed conservatively with  observation.  However, she recently developed recurrent left-sided flank  pain.  She therefore presents for endoscopic evaluation and possible  treatment today.  The potential risks, complications, and alternative  treatment options associated with the  above procedures were discussed in  detail and informed consent was obtained.   DESCRIPTION OF PROCEDURE:  The patient was taken to the operating room  and a general anesthetic was administered.  She was given preoperative  antibiotics, placed in the dorsal lithotomy position, and prepped and  draped in the usual sterile fashion.  Next, preoperative time-out was  performed.  Cystourethroscopy was then performed which demonstrated a  normal urethra and bladder without evidence of any bladder tumors,  stones, or other mucosal pathology.  The left ureteral orifice was  identified in its normal anatomic position and was cannulated with a 6-  [place] ureteral catheter.  Omnipaque contrast was then injected which  filled out a normal mid and distal ureter.  In the proximal ureter at  the level of L4, she was noted to have what appeared to be a possible  short ureteral stricture.  There were no filling defects within the  renal collecting system, although she did have some blunting of her  calyces.  A 0.038 Sensor guidewire was then advanced up into the renal  pelvis under fluoroscopic guidance and this passed without difficulty.  The digital flexible ureteroscope was then  advanced up to the mid ureter  without difficulty.  At the junction of L4-L5, there was noted to be  some resistance and under direct vision, there was noted to be an area  that was slightly narrowed.  The ureter also had an appearance in this  area of being possibly mildly ischemic.  Contrast was injected through  the ureteroscope and this confirmed what appeared to be a very short 1  cm ureteral stricture at the level of L4.  Therefore was decided to  proceed with balloon dilation.  Based on the fact the patient did not  appear to have any evidence for malignancy and had a known factor for  the cause of her stricture, it was decided not to proceed with a brush  biopsy at this point.  The Sensor guidewire was advanced up into  the  renal pelvis and a 15-French 4 cm ureteral balloon dilator was advanced  over the wire across the strictured area without difficulty.  It was  then inflated and all waist was seen to be removed from the balloon as  it was dilated.  This was left inflated for 5 minutes and then deflated  and removed.  Reinspection with the ureteroscope revealed that the  ureteroscope could be advanced past the stricture without difficulty.  At this point.  The entire renal collecting system was examined.  There  was no evidence of any tumors or other pathology.  The wire was then  replaced as the ureteroscope was removed.  The ureter was again examined  and there was no evidence of any other abnormal findings.  The wire was  then back loaded over cystoscope and an 8 x 24 double-J ureteral stent  was advanced over the wire using Seldinger technique and positioned  appropriately within the renal pelvis as well as in the bladder.  The  wire was then removed with good curl noted in the renal pelvis as well  as in the bladder.  The patient's bladder was emptied.  The procedure  was ended.  She tolerated the procedure well without complications.  She  was able to be transferred to the recovery unit after being awakened  from anesthesia.      Heloise Purpura, MD  Electronically Signed     LB/MEDQ  D:  12/29/2008  T:  12/29/2008  Job:  [id number]

## 2010-09-21 NOTE — Op Note (Signed)
NAMECRISTLE, JARED             ACCOUNT NO.:  [id number]   MEDICAL RECORD NO.:  [id number]          PATIENT TYPE:  AMB   LOCATION:  DAY                          FACILITY:  [facility]   PHYSICIAN:  Excell Seltzer. Annabell Howells, M.D.    DATE OF BIRTH:  [birth year]   DATE OF PROCEDURE:  09/12/2008  DATE OF DISCHARGE:  09/12/2008                               OPERATIVE REPORT   PROCEDURE:  1. Cystoscopy.  2. Left retrograde pyelogram with interpretation.  3. Insertion of left double-J stent.   PREOPERATIVE DIAGNOSIS:  Left hydronephrosis with febrile urinary tract  infection and ureteral stricture.   POSTOPERATIVE DIAGNOSIS:  Left hydronephrosis with febrile urinary tract  infection and ureteral stricture.   SURGEON:  Excell Seltzer. Annabell Howells, M.D.   ANESTHESIA:  General.   DRAINS:  6-French 24 cm double-J stent.   COMPLICATIONS:  None.   INDICATIONS:  Mrs. Foskey is a 65 year old white female who several  months ago underwent an anterior diskectomy through a left  retroperitoneal approach.  Postoperatively she developed a lymphocele  that resulted in the left ureteral obstruction structure requiring  stenting and percutaneous drainage of the lymphocele.  She returned to  the office today with a recent urinary tract infection with left flank  pain and fevers to 102.  She had been placed on Cipro.  Her irritative  voiding symptoms had resolved.  Her flank pain and fever not completely  resolved and a repeat renal ultrasound in the office followed by CT scan  revealed moderate hydronephrosis.  It was felt that stenting was  indicated in the face of infection to further her treatment.   PROCEDURE:  The patient was taken operating room where general  anesthetic was induced.  She was given Cipro.  She was placed in  lithotomy position.  Her perineum and genitalia were prepped with  Betadine solution and she was draped in the usual sterile fashion.  Time-  out was performed.   Cystoscopy was performed using  a 22-French scope and a 12-degree lens  examination revealed a normal urethra.  The bladder wall had mild  trabeculation.  No tumors or stones were noted.  The ureteral orifices  were unremarkable.   Left ureteral orifice was cannulated with a 5-French open-end catheter  and contrast was instilled.  This revealed a normal distal and mid  ureter.  However, in the proximal ureter there was a long area of  narrowing that did not allow the efflux of contrast into a moderately  dilated collecting system.   After completion of retrograde pyelogram which revealed the proximal  stricturing, the opening catheter was advanced to the kidney.  A  hydronephrotic drip was noted.  A guidewire was then passed through the  opening catheter to the kidney and the opening catheter was removed.  A  6-French 24 cm double-J stent was inserted over the wire without  difficulty under fluoroscopic guidance.  The wire was removed leaving  good coil in the kidney and  good coil in the bladder.  The bladder was drained.  The scope was  removed.  The patient was taken  down from lithotomy position.  Her  anesthetic was reversed and she was moved to the recovery room in stable  condition.  There no complications.      Excell Seltzer. Annabell Howells, M.D.  Electronically Signed     JJW/MEDQ  D:  09/14/2008  T:  09/14/2008  Job:  [id number]   cc:   Stefani Dama, M.D.  Fax: [phone]   P. Liliane Bade, M.D.  [address]  [place]  [place] [address]

## 2010-09-21 NOTE — Op Note (Signed)
NAMEMARIAH, GERSTENBERGER             ACCOUNT NO.:  [id number]   MEDICAL RECORD NO.:  [id number]          PATIENT TYPE:  AMB   LOCATION:  NESC                         FACILITY:  [facility]   PHYSICIAN:  Marlowe Kays, M.D.  DATE OF BIRTH:  05/16/1945   DATE OF PROCEDURE:  04/27/2007  DATE OF DISCHARGE:                               OPERATIVE REPORT   PREOPERATIVE DIAGNOSIS:  1. Osteoarthritis.  2. Torn lateral meniscus, right knee.   POSTOPERATIVE DIAGNOSIS:  1. Osteoarthritis.  2. Torn medial and lateral menisci, right knee.   OPERATION:  Right knee arthroscopy with  1. Partial medial and lateral meniscectomy.  2. Shaving of medial and lateral femoral condyles.  3. Shaving of patella.   SURGEON:  Marlowe Kays, M.D.   ASSISTANT:  Nurse   ANESTHESIA:  General.   PATHOLOGY AND JUSTIFICATION FOR PROCEDURE:  Because of pain and swelling  in her right knee, she had an MRI performed which demonstrated  osteoarthritis and a torn lateral meniscus.  Accordingly, she is here  today for the above mentioned surgery.   DESCRIPTION OF PROCEDURE:  Satisfactory general anesthesia, Ace wrap and  knee support to left lower extremity, pneumatic tourniquet applied to  right lower extremity with the leg wrapped out non-sterilely with an  Esmarch, and tourniquet inflated to 300 mmHg.  Then, applied a thigh  stabilizer and the right leg was prepped from stabilizer to ankle with  DuraPrep and draped in a sterile field.  Superomedial saline inflow.  First, through an anterolateral portal, the medial compartment of the  knee joint was evaluated.  She had a flap type tear of the anterior  third of the medial meniscus which I pictured and debrided down with a  3.5 shaver.  She also had grade 2-3 out of 4 wear over most of the  medial femoral condyle which I shaved down until smooth, as well.  Posteriorly, her medial meniscus appeared to be normal on probing.  Next, looking up in the medial gutter and  suprapatellar area, her  patella had a good bit of wear which I shaved down with the 3.5 shaver  with pre and post films being taken.  I then reversed portals.  The ACL  was intact.  She had tears of both the anterior third and a good bit of  the inner body of the lateral meniscus which I pictured and shaved down  until smooth and, also, shaved down the chondromalacia of the lateral  femoral condyle.  I then irrigated her knee joint until clear.  All  fluid possible was removed.  I closed the two anterior portals with 4-0  nylon and then injected 20 mL of 0.5% Marcaine with adrenalin and 4 mg  morphine  through the inflow apparatus which I removed and then closed this portal  with 4-0 nylon, as well.  Betadine, Adaptic, dry, sterile dressing were  applied.  The tourniquet was released.  She tolerated the procedure  well.  At the time of this dictation, she was on her way to the recovery  room in satisfactory condition with no complications.  ______________________________  Marlowe Kays, M.D.     JA/MEDQ  D:  04/27/2007  T:  04/28/2007  Job:  [id number]

## 2010-09-21 NOTE — Discharge Summary (Signed)
NAMEBREHANNA, Whitney             ACCOUNT NO.:  [id number]   MEDICAL RECORD NO.:  [id number]          PATIENT TYPE:  INP   LOCATION:  3014                         FACILITY:  [facility]   PHYSICIAN:  Stefani Dama, M.D.  DATE OF BIRTH:  09-22-1945   DATE OF ADMISSION:  01/28/2008  DATE OF DISCHARGE:  01/31/2008                               DISCHARGE SUMMARY   ADMITTING DIAGNOSES:  Lumbar spondylosis and degenerative disk disease  L2-3 and L3-4 with radiculopathy.   DISCHARGE DIAGNOSES:  Lumbar spondylosis and degenerative disk disease  L2-3 and L3-4 with radiculopathy.   PROCEDURES:  Anterior lumbar interbody fusion and decompression with  allograft, PEEK spacers, and plate stabilization L2-3 and L3-4.   BRIEF HISTORY:  The patient is a 65 year old female with a several-year  history of increasing low back pain and radiculopathy, failed  conservative care and elected to proceed with surgical intervention for  her lumbar spondylosis at L2-3 and L3-4.   HOSPITAL COURSE:  The patient tolerated the procedure under general  anesthesia on January 28, 2008.  She underwent an anterior lumbar  interbody fusion L2-3 and L3-4, stabilized in recovery room, placed on  the floor, placed on PCA pump for pain control, placed on Lovenox  prophylaxis against DVT.  Her diet was advanced slowly due to her  abdominal approach during the surgery.  Started with physical therapy  and occupational therapy.  First day postoperatively, the patient was  doing well.  Vital signs were stable, hemoglobin was stable, and BMET  within normal limits.  Her central line and arterial line were  discontinued.  She was transferred out of the neurosurgical ICU to 3000.  She continued to make progress with physical therapy and occupational  therapy, was ready for discharge home on January 31, 2008, eating  well, voiding well, positive bowel movement prior to discharge.  Vital  signs were stable.   DISCHARGE  CONDITION:  Stable and improved.   DISCHARGE INSTRUCTIONS:  Discharge home.  Given prescription for,  1. Percocet 1-2 p.o. q.4-6 h. p.r.n. pain.  2. Valium 5 mg 1 p.o. q.6-8 h. p.r.n. muscle spasm.  3. Lovenox 40 mg subcu daily x7 doses.   Her IV was discontinued prior to discharge home.  She is to follow up  with Dr. Danielle Dess in 3 weeks.  [phone], call for an appointment.  Lovenox  teaching prior to discharge home, back precautions instructed the  patient prior to discharge home.  Contact our office prior to follow up  if there are any questions or concerns.      [name].      Stefani Dama, M.D.  Electronically Signed    SCI/MEDQ  D:  01/31/2008  T:  01/31/2008  Job:  [id number]

## 2010-09-24 ENCOUNTER — Ambulatory Visit: Payer: Medicare Other | Admitting: Physical Therapy

## 2010-09-24 NOTE — Op Note (Signed)
TNAMEDave, Mannes.:  [id number]   MEDICAL RECORD NO.:  [id number]                    PATIENT TYPE:   LOCATION:                                       FACILITY:   PHYSICIAN:  Excell Seltzer. Annabell Howells, M.D.                 DATE OF BIRTH:   DATE OF PROCEDURE:  12/06/2001  DATE OF DISCHARGE:                                 OPERATIVE REPORT   PROCEDURE:  1. Cystoscopy and fulguration of urethral polyps.  2. SPARC sling.  3. Anterior repair.   PREOPERATIVE DIAGNOSIS:  Urethral polyps, stress urinary incontinence, and  cystocele.   POSTOPERATIVE DIAGNOSIS:  Urethral polyps, stress urinary incontinence, and  cystocele.   SURGEON:  Excell Seltzer. Annabell Howells, M.D.   ANESTHESIA:  General.   DRAIN:  Foley catheter.   COMPLICATIONS:  None.   INDICATIONS FOR PROCEDURE:  The patient is a 65 year old white female who  initially was sent for microhematuria.  Her hematuria evaluation revealed  some friable benign-appearing polyps in the proximal urethra. She was also  complaining of stress incontinence and was noted to have severe stress  incontinence on exam as well as a cystocele.  She has elected to proceed  with cysto, fulguration of polyps, SPARC sling, and anterior repair.   FINDINGS AND PROCEDURE:  The patient was given p.o. Tequin.  She was taken  to the operating room.  A general anesthetic was induced.  Her mons was  shaved.  She was placed in the lithotomy position.  Her perineum and  genitalia were prepped with Betadine solution.  She was draped in the usual  sterile fashion.  Cystoscopy was performed using the 22 [place] scope, 12-  degree lens, and a Bugbee electrode.  The polyps in the proximal urethra,  which were quite vascular, were lightly fulgurated without difficulty.  Once  this had been completed, the cystoscope was removed and a Foley catheter was  inserted.  The anterior vaginal wall was infiltrated with about 8 cc of 1%  Lidocaine with  epinephrine.  Two incisions were made just lateral to the  midline over the pubis, measuring approximately 1.5 cm in length.  The fat  was spread to the fascia under these incisions.  A moist sponge was then  placed over these incisions.  The anterior vaginal wall incision was then  made from the mid urethra back to just distal to the cervix and the vaginal  mucosa was elevated off the pubourethral and pubovesical fascia and up to  detachment to the sidewall.  Once the cystocele had been exposed it was  reduced with three horizontal mattress #2 Vicryl stitches with good result.  At this point the [facility] trocars were passed.  The right was passed first  through the right abdominal incision; the tip was passed down until it was  on top of the pubis.  The trocar was then walked behind the pubis until it  could be felt by a finger in the vaginal incision.  The finger was also used  to push the urethra to the contralateral side out of the way.  The trocar  was then brought into the vaginal incision through the pubourethral fascia.  This was then repeated on the left.  At this point, cystoscopy was performed  with the 22 [place] scope and 70-degree lenses.  The bladder was filled to  fullness and inspection revealed no evidence of bladder or urethral injury  from the trocars.  The bladder was then drained.  The [facility] mesh was then  snapped to the trocars and drawn up into the abdominal incision in  appropriate position.  The redundant ends of the sling material were cut off  with heavy scissors and the sheath was then removed from each side.  Once  the sheath was removed the position and the tension of the mesh was  evaluated and was felt to be appropriate.  At this point repeat cystoscopy  was performed and no evidence of sheath intrusion into the bladder or  urethra was noted.  Pressure on the bladder produced a small but somewhat  deviated urinary stream indicating appropriate tension.  The  Foley catheter  was then replaced, the bladder was drained.  Once again, the tension of the  mesh material was checked using a fine right-angle clamp which passed easily  beneath the material.  At this point the redundant vaginal mucosa was  excised and the anterior vaginal wall was closed with a running locked 2-0  Vicryl stitch.  The abdominal incisions were cleaned, dried, prepped with  tincture of Benzoin, and closed with Steri-Strips.  A 2-inch iodoform  vaginal pack was placed.  The Foley was placed to straight drainage.  The  patient was taken down from lithotomy position, her anesthetic was reversed,  and she was moved to the recovery room in stable condition.  There were no  complications.                                                Excell Seltzer. Annabell Howells, M.D.    JJW/MEDQ  D:  12/06/2001  T:  12/12/2001  Job:  [id number]   cc:   Ernestina Penna, M.D.

## 2010-09-24 NOTE — Discharge Summary (Signed)
[name], [name]             ACCOUNT NO.:  [id number]   MEDICAL RECORD NO.:  [id number]          PATIENT TYPE:  INP   LOCATION:  3039                         FACILITY:  [facility]   PHYSICIAN:  Stefani Dama, M.D.  DATE OF BIRTH:  01-06-1946   DATE OF ADMISSION:  02/09/2008  DATE OF DISCHARGE:  02/12/2008                               DISCHARGE SUMMARY   ADMITTING DIAGNOSES:  1. Left flank pain.  2. Postoperative ileus.  3. Status post anterior lumbar interbody fusion and decompression L2-      3, L3-4 on January 28, 2008, by Dr. Danielle Dess with retroperitoneal      approach by Dr. Madilyn Fireman.   SECONDARY DIAGNOSIS:  Postoperative ileus.   HOSPITAL COURSE:  The patient was admitted on February 09, 2008, about 12  days after she underwent anterior lumbar interbody fusion and  decompression on January 28, 2008, for severe left flank pain and  ileus.  Dr. Ezequiel Essex was on-call, and he admitted her, ordered a CT of the  abdomen and pelvis, and she was admitted for IV fluids, hydration, and  pain control.  She was placed on a Dilaudid PCA pump and this helped her  pain level tremendously.  First day post-hospitalization, the patient  was feeling much better, starting to ambulate, neurovascularly intact,  abdomen was soft, positive bowel sounds, started with clear liquids.  Second day post-hospitalization, positive flatus, no nausea or vomiting,  Dulcolax suppository was given, advancement of her diet, and increase of  her activity.  She was ready for discharge home on February 12, 2008,  eating well, voiding well, positive bowel movements, marked decrease in  her pain.  Her postop CT scan did show some fluid collection but no  obvious problems to require any repeat surgical intervention.  She was  ready for discharge home.   DISCHARGE CONDITION:  Stable, improved.   DISCHARGE INSTRUCTIONS:  Discharged home on a regular diet, still to  advance her diet slowly.  Given a prescription for  Percocet and Valium  for pain control.  Continue with a comprehensive bowel regimen, Colace,  Dulcolax suppositories, continue with mobilization.  Follow up with Dr.  Danielle Dess in 2 weeks.  Contact our office prior to follow up if she has any  question or concerns.      [name].      Stefani Dama, M.D.  Electronically Signed    SCI/MEDQ  D:  04/10/2008  T:  04/10/2008  Job:  [id number]

## 2010-09-29 ENCOUNTER — Ambulatory Visit: Payer: Medicare Other | Admitting: Physical Therapy

## 2010-10-01 ENCOUNTER — Encounter: Payer: Medicare Other | Admitting: Physical Therapy

## 2010-10-05 ENCOUNTER — Ambulatory Visit: Payer: Medicare Other | Admitting: Physical Therapy

## 2010-10-06 ENCOUNTER — Ambulatory Visit: Payer: Medicare Other | Attending: Gynecologic Oncology | Admitting: Gynecologic Oncology

## 2010-10-06 DIAGNOSIS — Z96649 Presence of unspecified artificial hip joint: Secondary | ICD-10-CM | POA: Insufficient documentation

## 2010-10-06 DIAGNOSIS — Z79899 Other long term (current) drug therapy: Secondary | ICD-10-CM | POA: Insufficient documentation

## 2010-10-06 DIAGNOSIS — Z96659 Presence of unspecified artificial knee joint: Secondary | ICD-10-CM | POA: Insufficient documentation

## 2010-10-06 DIAGNOSIS — Z800 Family history of malignant neoplasm of digestive organs: Secondary | ICD-10-CM | POA: Insufficient documentation

## 2010-10-06 DIAGNOSIS — I10 Essential (primary) hypertension: Secondary | ICD-10-CM | POA: Insufficient documentation

## 2010-10-06 DIAGNOSIS — C549 Malignant neoplasm of corpus uteri, unspecified: Secondary | ICD-10-CM | POA: Insufficient documentation

## 2010-10-07 NOTE — Consult Note (Signed)
NAMEJAVEN, [name]             ACCOUNT NO.:  [id number]  MEDICAL RECORD NO.:  [id number]           PATIENT TYPE:  O  LOCATION:  GYN                          FACILITY:  [facility]  PHYSICIAN:  Vidal Lampkins A. Duard Brady, MD    DATE OF BIRTH:  [birth year]  DATE OF CONSULTATION:  10/06/2010 DATE OF DISCHARGE:                                CONSULTATION   REFERRING PHYSICIAN:  Ernestina Penna, MD.  HISTORY OF PRESENT ILLNESS:  The patient is seen today in consultation request of Dr. Ralph Dowdy.  [name] is a 65 year old gravida 3, para 3 who is menopausal since the age of 65.  She did try hormone replacement therapy for about 2 months but had some bleeding and therefore stopped it.  She started having some postmenopausal bleeding in January of this past year, it has been light spotting, last about 1 to 2 days, it occurs every month and it has been quite predictable just like menstrual cycle. She was seen by Dr. Ralph Dowdy and underwent an endometrial biopsy that revealed a grade 1 endometrioid adenocarcinoma.  She was subsequently referred to [place] secondary to her weight and multiple comorbidities.  She otherwise has no complaints.  She comes accompanied by her granddaughter Marchelle Folks who is her caretaker.  REVIEW OF SYSTEMS:  She denies any change in bowel or bladder habits. She did have a sling placed by Dr. Annabell Howells in 2003 that helped with her stress urinary incontinence.  She is not really sure what her endurance is but she is recovering fairly consistently for some type of procedure, most recently she had left foot surgery.  Prior to that it was a total hip replacement in September of 2011, total knee replacement in May 2011.  She states her endurance is low because of her orthopedic and back surgeries and not because of any cardiovascular symptoms.  She denies any chest pain, shortness of breath, nausea, vomiting, fevers, chills, unintentional weight loss or weight gain.  She denies any headaches, visual  changes.  PAST MEDICAL HISTORY:  Significant for hypertension, morbid obesity.  PAST SURGICAL HISTORY:  She had back surgery in 1989 and 1999, most recently 2009.  The surgery in 2009 was via an abdominal approach.  She had left kidney surgery by Dr. Laverle Patter in 2010.  She had a sling in 2003. She had left foot surgery in February of 2012 at which point they rebuilt her instep and fused at the bone.  She had right bunion and hammertoe surgery on the right side.  She had a right total knee replacement in May of 2011, a right total hip replacement in September of 2011, bilateral cataract surgery in 2003.  MEDICATIONS: 1. Vitamin D. 2. Gabapentin 300 mg q.h.s. 3. Hydrochlorothiazide lisinopril 20/12.5 one q.a.m. 4. Meloxicam 15 mg daily. 5. Nitrofurantoin 15 mg q.h.s. 6. Amitriptyline 20 mg q.h.s.  ALLERGIES:  ZOFRAN which causes nausea.  CASHEWS cause hives.  FAMILY HISTORY:  Her mother had diabetes.  Her father had diabetes.  She has a sister who had colon cancer at the age of 58, was metastatic, it recurred to the liver.  She had diabetes.  She has daughter with epilepsy and seizures and another sister with diabetes.  HEALTH MAINTENANCE:  She denies use of tobacco or alcohol.  She is widowed, she has been for 10 years.  She is up-to-date on her mammograms a colonoscopy.  PHYSICAL EXAMINATION:  VITAL SIGNS:  Weight 222 pounds, height 5 feet 1, BMI 42, blood pressure 118/72, pulse 86, respirations 18, temperature 97.1 GENERAL:  Well-nourished, well-developed female, in no acute distress. NECK:  Supple.  There is no lymphadenopathy, no thyromegaly. LUNGS:  Clear to auscultation bilaterally. CARDIOVASCULAR EXAM:  Regular rate and rhythm. ABDOMEN:  Morbidly obese with significant pannus.  She has well-healed surgical incisions.  There is no evidence of incisional hernias.  She has a supraumbilical midline incision.  She has a left-sided incision over her flank.  There is no  palpable mass or hepatosplenomegaly but exam is limited by habitus.  Groins are negative for adenopathy. EXTREMITIES:  There is no edema.  She has a TED hose on her left leg. She has a well-healed incision over her right knee. PELVIC:  External genitalia is within normal limits.  The vagina is well epithelialized.  The cervix is visualized.  There is no visible lesions. Bimanual examination, corpus is slightly globular.  There are no adnexal masses.  Exam is limited by habitus.  There is an adequate pubic arch.  ASSESSMENT:  This is a 65 year old with a grade 1, clinical stage I endometrioid adenocarcinoma is referred to [place] secondary to morbid obesity and multiple medical comorbidities.  I did discuss with her and her granddaughter that we would proceed with hysterectomy.  I do think it is worth trying to do this laparoscopically/robotically though the fact that Dr. Laverle Patter had it converted to an open procedure secondary to adhesive disease somewhat concerning but secondary to her obesity I think it would be beneficial from morbidity standpoint to avoid midline incision, therefore we try it.  Discussed with her if we were able to do it robotically, she will not require  extended DVT prophylaxis but ifshe has a laparotomy she will require extended DVT prophylaxis which she is familiar to doing from orthopedic surgeries.  We discussed the risks of the procedure including bleeding, injury to surrounding organs, thromboembolic disease, need for vertical midline incision.  Her questions were elicited to her satisfaction.  We will plan on surgery for November 02, 2010.  She has my card as well as that of [place].  She will call us if she has any questions.     Shraga Custard A. Duard Brady, MD     PAG/MEDQ  D:  10/06/2010  T:  10/06/2010  Job:  [id number]  cc:   Ernestina Penna, MD Fax: [phone]  Ernestina Penna, M.D. Fax: [phone]  Telford Nab, R.N. [address] [place], [place] [address]  Heloise Purpura, MD Fax: [phone]  Electronically Signed by Cleda Mccreedy MD on 10/07/2010 09:02:54 AM

## 2010-10-11 ENCOUNTER — Ambulatory Visit: Payer: Medicare Other | Attending: Orthopedic Surgery | Admitting: Physical Therapy

## 2010-10-11 DIAGNOSIS — M25579 Pain in unspecified ankle and joints of unspecified foot: Secondary | ICD-10-CM | POA: Insufficient documentation

## 2010-10-11 DIAGNOSIS — IMO0001 Reserved for inherently not codable concepts without codable children: Secondary | ICD-10-CM | POA: Insufficient documentation

## 2010-10-11 DIAGNOSIS — M25676 Stiffness of unspecified foot, not elsewhere classified: Secondary | ICD-10-CM | POA: Insufficient documentation

## 2010-10-11 DIAGNOSIS — R269 Unspecified abnormalities of gait and mobility: Secondary | ICD-10-CM | POA: Insufficient documentation

## 2010-10-11 DIAGNOSIS — M25673 Stiffness of unspecified ankle, not elsewhere classified: Secondary | ICD-10-CM | POA: Insufficient documentation

## 2010-10-11 DIAGNOSIS — R5381 Other malaise: Secondary | ICD-10-CM | POA: Insufficient documentation

## 2010-10-15 ENCOUNTER — Ambulatory Visit: Payer: Medicare Other | Admitting: *Deleted

## 2010-10-18 ENCOUNTER — Ambulatory Visit: Payer: Medicare Other | Admitting: Physical Therapy

## 2010-10-20 ENCOUNTER — Ambulatory Visit: Payer: Medicare Other | Admitting: Physical Therapy

## 2010-10-29 ENCOUNTER — Other Ambulatory Visit: Payer: Self-pay | Admitting: Obstetrics & Gynecology

## 2010-10-29 ENCOUNTER — Encounter (HOSPITAL_COMMUNITY): Payer: Medicare Other

## 2010-10-29 ENCOUNTER — Other Ambulatory Visit: Payer: Self-pay | Admitting: Gynecologic Oncology

## 2010-10-29 LAB — COMPREHENSIVE METABOLIC PANEL
ALT: 19 U/L (ref 0–35)
AST: 19 U/L (ref 0–37)
CO2: 27 mEq/L (ref 19–32)
Calcium: 10 mg/dL (ref 8.4–10.5)
Chloride: 101 mEq/L (ref 96–112)
GFR calc Af Amer: >60 mL/min (ref >60)
GFR calc non Af Amer: >60 mL/min (ref >60)
Glucose, Bld: 82 mg/dL (ref 70–99)
Sodium: 139 mEq/L (ref 135–145)
Total Bilirubin: 0.6 mg/dL (ref 0.3–1.2)

## 2010-10-29 LAB — CBC
Hemoglobin: 14.7 g/dL (ref 12.0–15.0)
MCH: 31.7 pg (ref 26.0–34.0)
MCV: 91.6 fL (ref 78.0–100.0)
Platelets: 309 10*3/uL (ref 150–400)
RBC: 4.63 MIL/uL (ref 3.87–5.11)
WBC: 10.7 10*3/uL — ABNORMAL HIGH (ref 4.0–10.5)

## 2010-10-29 LAB — DIFFERENTIAL
Basophils Relative: 0 % (ref 0–1)
Lymphs Abs: 3.8 10*3/uL (ref 0.7–4.0)
Monocytes Relative: 8 % (ref 3–12)
Neutro Abs: 5.8 10*3/uL (ref 1.7–7.7)
Neutrophils Relative %: 54 % (ref 43–77)

## 2010-10-31 LAB — MRSA CULTURE

## 2010-11-02 ENCOUNTER — Other Ambulatory Visit: Payer: Self-pay | Admitting: Gynecologic Oncology

## 2010-11-02 ENCOUNTER — Inpatient Hospital Stay (HOSPITAL_COMMUNITY)
Admission: RE | Admit: 2010-11-02 | Discharge: 2010-11-04 | DRG: 741 | Disposition: A | Discharge status: Home / Self Care | Payer: Medicare Other | Source: Ambulatory Visit | Attending: Obstetrics & Gynecology | Admitting: Obstetrics & Gynecology

## 2010-11-02 DIAGNOSIS — K660 Peritoneal adhesions (postprocedural) (postinfection): Secondary | ICD-10-CM | POA: Diagnosis present

## 2010-11-02 DIAGNOSIS — R7309 Other abnormal glucose: Secondary | ICD-10-CM | POA: Diagnosis not present

## 2010-11-02 DIAGNOSIS — C549 Malignant neoplasm of corpus uteri, unspecified: Principal | ICD-10-CM | POA: Diagnosis present

## 2010-11-02 DIAGNOSIS — K432 Incisional hernia without obstruction or gangrene: Secondary | ICD-10-CM | POA: Diagnosis present

## 2010-11-02 DIAGNOSIS — Z01812 Encounter for preprocedural laboratory examination: Secondary | ICD-10-CM

## 2010-11-02 DIAGNOSIS — D259 Leiomyoma of uterus, unspecified: Secondary | ICD-10-CM | POA: Diagnosis present

## 2010-11-02 DIAGNOSIS — I10 Essential (primary) hypertension: Secondary | ICD-10-CM | POA: Diagnosis present

## 2010-11-02 HISTORY — PX: INCISIONAL HERNIA REPAIR: SHX193

## 2010-11-02 LAB — SAMPLE TO BLOOD BANK

## 2010-11-03 LAB — CBC
HCT: 36.6 % (ref 36.0–46.0)
Hemoglobin: 12.2 g/dL (ref 12.0–15.0)
MCV: 92.2 fL (ref 78.0–100.0)
RBC: 3.97 MIL/uL (ref 3.87–5.11)
WBC: 15.6 10*3/uL — ABNORMAL HIGH (ref 4.0–10.5)

## 2010-11-03 LAB — BASIC METABOLIC PANEL
BUN: 6 mg/dL (ref 6–23)
CO2: 26 mEq/L (ref 19–32)
Chloride: 100 mEq/L (ref 96–112)
Creatinine, Ser: 0.5 mg/dL (ref 0.50–1.10)
GFR calc Af Amer: >60 mL/min (ref >60)
Glucose, Bld: 196 mg/dL — ABNORMAL HIGH (ref 70–99)
Potassium: 4.3 mEq/L (ref 3.5–5.1)

## 2010-11-05 NOTE — Op Note (Signed)
NAMECLEO, SANTUCCI             ACCOUNT NO.:  [id number]  MEDICAL RECORD NO.:  [id number]  LOCATION:  1526                         FACILITY:  [facility]  PHYSICIAN:  Sawyer Kahan A. Duard Brady, MD    DATE OF BIRTH:  03/24/1946  DATE OF PROCEDURE:  11/02/2010 DATE OF DISCHARGE:                              OPERATIVE REPORT   PREOPERATIVE DIAGNOSIS:  Grade 1 endometrioid adenocarcinoma.  POSTOPERATIVE DIAGNOSES:  Grade 1 endometrioid adenocarcinoma, and incisional hernia.  PROCEDURES: 1. Diagnostic laparoscopy. 2. Total abdominal hysterectomy, bilateral salpingo-oophorectomy. 3. Lysis of adhesions for 20 minutes. 4. Incisional hernia repair.  SURGEONS:  Harli Engelken A. Duard Brady, MD and Roseanna Rainbow, M.D.  ASSISTANT:  Telford Nab, R.N.  ANESTHESIA:  General.  ESTIMATED BLOOD LOSS:  50 mL.  URINE OUTPUT:  175 mL.  IV FLUIDS:  2300 mL of crystalloid, 500 Hextend.  DISPOSITION OF SPECIMEN:  To pathology.  COMPLICATIONS:  None.  OPERATIVE FINDINGS:  Included significant adhesive disease of the omentum and anterior abdominal wall, small bowel to anterior abdominal wall.  Globular fibroid uterus.  Significant intra-abdominal obesity.  A 3-cm incisional hernia in the area of prior left-sided paramedian incision.  Frozen section revealed minimal myometrial invasion.  The patient was identified in preoperative holding area as self.  Risks and benefits of the procedure were discussed with the patient.  Informed consent was signed on the chart.  The patient understood the risk of laparotomy secondary to presumed adhesive disease.  PROCEDURE NOTE:  The patient was taken to the operating room, placed in supine position.  Arms were tucked to her side with all appropriate precautions.  A general anesthesia was then induced.  She was then placed in dorsal lithotomy position with appropriate precautions.  First time-out was performed to confirm the patient, the procedure, antibiotic and  allergy status.  The perineum was closed with Betadine solution. Sterile speculum was placed in the vagina.  The cervix was grasped with single-tooth tenaculum.  It was dilated without difficulty and a ZUMI with a medium KOH ring was placed.  The patient's abdomen was then prepped in usual fashion with 2 ChloraPrep sticks.  After waiting the allotted time, the patient was then draped.  A second time-out was performed to confirm the patient, the procedure, antibiotic, and allergy status; OG tube placement to suction.  A 5-mm incision was made 2 cm below the costal margin midclavicular line on the left side after anesthetizing the skin was 0.25% plain Marcaine.  Using a 5-mm Optiview, intraperitoneal cavity placement was confirmed.  The abdomen was then insufflated with CO2 gas.  At this point, it was very obvious that there was significant adhesive disease and no other port sites were going to be able to be placed.  Decision was then made to proceed with a laparotomy.  The trocar was then removed, the skin was closed using a 4- 0 Vicryl.  The.  The patient's left arm shoulder block was then removed and the arm was untucked.  A paramedian incision in the area of her paramedian incision was made with the knife.  The old scar was cut out. We dissected down to the underlying fascia using Bovie cautery.  The fascia  was identified and scored.  The fascial incision was extended superiorly and inferiorly.  The hernia was identified.  The fascial edges were then grasped with Kocher clamps for approximately 20 minutes. We dissected the omentum and small bowel off the anterior abdominal wall.  The hernia sac was then excised and the omentum was brought out of that area.  At this point, it was noted that we had to extend the incision inferiorly for visualization secondary to morbid obesity.  This was done with visualization of underlying peritoneal cavity.  The Bookwalter self-retaining retractor was  then placed on the bed with appropriate cautions.  The shoulder blade that could hold the sidewall was used.  This was the medium.  At this point and all points during the case, there was no significant pressure in the psoas bellies.  The patient was placed in Trendelenburg position.  The rectosigmoid colon was packed with moist laparotomy sponges out of the pelvic field.  There was some adhesive disease of the cecum to the right pelvic sidewall. This was taken down carefully.  The large bowel was then packed out of the way and allowed exposure to the pelvis.  The ZUMI and the KOH ring were then removed without difficulty.  The round ligament on the patient's left side was transected with monopolar cautery and the posterior leaflet of broad ligament was then opened. There was significant retroperitoneal fibrosis.  We were not able to palpate the identify the ureter retroperitoneally but transperitoneally it was palpated.  A window was made very high up on the IP and the level of the cornua and extended inferiorly.  The IP pedicle was inspected and the ureter was not within the IP pedicle.  It was clamped x2, transected and suture ligated.  The bladder flap was then created on the patient's left side.  The uterine vessels were skeletonized.  The uterine vessels were grasped with a curved mattress and transected and suture ligated. On the patient's right side, the round ligament was transected and the anterior and posterior leaflets of broad ligament were opened.  The bladder flap was completed anteriorly.  The posterior leaflet of the broad ligament was opened.  There was no retroperitoneal fibrosis on her right side.  The ureter was identified without difficulty in the retroperitoneal space.  A window was made between the IP and the ureter. The IP was clamped x2, transected, and suture ligated.  The uterine vessels were then skeletonized on her right side similarly taken with a curved  mattress and clamped, transected, and suture ligated.  We continued down the cardinal ligament without difficulty reaching the cervicovaginal junction.  We came across under the cervix to the vagina with curved mattress and clamp.  It was then transected.  The pedicles was suture ligated and held with a hemostat.  The vaginal cuff was closed using figure-of-eight sutures of 0 Vicryl.  There was noted to be adequate hemostasis.  The uterus was sent for frozen section. Paravesical and pararectal spaces were opened on the patient's right side.  There was some difficulty secondary to morbid obesity and limitations related to that.  We were about to begin to the pelvic lymph node dissection at this point.  Frozen section returned as minimal myometrial invasion and the nodal dissection was aborted.  Pedicles were all inspected and noted to be hemostatic.  Sutures were cut.  The Bookwalter retractor was then removed and all laparotomy packs were removed.  The pedicles were all inspected and noted to  be hemostatic. The abdomen and pelvis were copiously irrigated.  The fascia superiorly was grasped with Kocher clamps.  Ensuring that there was no adhesive disease and adequate pedicles for fascial closure, lysis of adhesions was continued to remove the omentum from the anterior abdominal wall.  The fascia was closed using mass closure of running #1 PDS from the superior and inferior aspects.  The hernia was repaired primarily.  The subcu tissues were irrigated and noted to be hemostatic. The skin was closed using staples.  All instrument, needle, laparotomy and Ray-Tec counts were correct x2. The patient tolerated the procedure well, was taken to the recovery room in stable condition.     Roquel Burgin A. Duard Brady, MD     PAG/MEDQ  D:  11/02/2010  T:  11/02/2010  Job:  [id number]  cc:   Ernestina Penna, MD Fax: [phone]  Ernestina Penna, M.D. Fax: [phone]  Telford Nab, R.N. [address] [place], [place] [address]  Heloise Purpura, MD Fax: [phone]  Roseanna Rainbow, M.D. Fax: [phone]  Electronically Signed by Cleda Mccreedy MD on 11/05/2010 07:41:48 AM

## 2010-11-12 NOTE — Discharge Summary (Signed)
  [name], DAPOLITO NO.:  [id number]  MEDICAL RECORD NO.:  [id number]  LOCATION:  1526                         FACILITY:  [facility]  PHYSICIAN:  Roseanna Rainbow, M.D.DATE OF BIRTH:  1945-10-17  DATE OF ADMISSION:  11/02/2010 DATE OF DISCHARGE:  11/04/2010                              DISCHARGE SUMMARY   CHIEF COMPLAINT:  The patient is a 65 year old with an endometrial adenocarcinoma who presents for operative management, please see the dictated history and physical for further details.  HOSPITAL COURSE:  The patient was admitted and underwent a diagnostic laparoscopy, total abdominal hysterectomy, bilateral salpingo- oophorectomy, lysis of adhesions, and an incisional hernia repair, please see the dictated operative summary for findings.  On postoperative day #1, a hemoglobin was 12.2 and a serum glucose was 196. Lovenox was started for thromboembolic prophylaxis.  Hemoglobin A1c was sent and was pending at the time of discharge.  An average serum glucose was 126.  She was tolerating a regular diet on the day of discharge.  DISCHARGE DIAGNOSIS:  Endometrial carcinoma, stage IA, leiomyoma incisional hernia.  PROCEDURES:  Diagnostic laparoscopy, total abdominal hysterectomy, bilateral salpingo-oophorectomy, lysis of adhesions, and incisional hernia repair.  CONDITION:  Stable.  DIET:  Regular.  ACTIVITY:  Pelvic rest, progressive activity.  DISCHARGE MEDICATIONS: 1. Lovenox 40 mg subcu to complete a 28-day course. 2. Percocet 5/325, 1-2 tablets every 6 hours as needed. 3. Amitriptyline. 4. Gabapentin. 5. Lisinopril/hydrochlorothiazide. 6. Meloxicam. 7. Vitamin D3.  DISPOSITION:  The patient was to follow up in the GYN oncology office.     Roseanna Rainbow, M.D.     LAJ/MEDQ  D:  11/11/2010  T:  11/11/2010  Job:  [id number]  cc:   Telford Nab, R.N. [address] [place], [place] [address]  Ernestina Penna, MD Fax:  [phone]  Ernestina Penna, M.D. Fax: [phone]  Heloise Purpura, MD Fax: [phone]  Electronically Signed by Antionette Char M.D. on 11/12/2010 03:43:01 PM

## 2010-12-29 ENCOUNTER — Ambulatory Visit: Payer: Medicare Other | Attending: Gynecologic Oncology | Admitting: Gynecologic Oncology

## 2010-12-29 DIAGNOSIS — C549 Malignant neoplasm of corpus uteri, unspecified: Secondary | ICD-10-CM | POA: Insufficient documentation

## 2010-12-29 DIAGNOSIS — Z9079 Acquired absence of other genital organ(s): Secondary | ICD-10-CM | POA: Insufficient documentation

## 2010-12-29 DIAGNOSIS — Z90710 Acquired absence of both cervix and uterus: Secondary | ICD-10-CM | POA: Insufficient documentation

## 2011-01-05 NOTE — Consult Note (Signed)
  [name], [name]             ACCOUNT NO.:  [id number]  MEDICAL RECORD NO.:  [id number]  LOCATION:  GYN                          FACILITY:  [facility]  PHYSICIAN:  Paola A. Duard Brady, MD    DATE OF BIRTH:  1945-10-09  DATE OF CONSULTATION:  12/29/2010 DATE OF DISCHARGE:                                CONSULTATION   [name] is a very pleasant 65 year old with postmenopausal bleeding. She was seen by Dr. Ralph Dowdy, had an endometrial biopsy performed that revealed a grade 1 endometrioid adenocarcinoma.  She was referred to [place]. After discussion of risks and benefits, she opted for surgery and on November 02, 2010 underwent diagnostic laparoscopy conversion to TAH-BSO, lysis of adhesions for 20 minutes, and incisional hernia repair.  OPERATIVE FINDINGS:  Included significant adhesive disease of the omentum and anterior abdominal wall, small bowel to the anterior abdominal wall, and a globular fibroid uterus.  She had a 3-cm incisional hernia.  Frozen section revealed minimal myometrial invasion. Fortunately, final pathology also revealed minimal myometrial invasion. She had a grade 1 endometrioid adenocarcinoma with focal superficial myometrial invasion of 0.1 cm with a myometrium of 1.8 cm or approximately 7%.  She had a 5-cm tumor.  No lymphovascular space involvement.  She comes in today for her postoperative check.  She did very well postoperatively and has no complaints.  She has no pain.  Her energy level is increasing.  She has had normal return of bowel function.  She does think that since her surgery she has little bit more difficulty with urinary urgency.  She has had a prior sling.  She is always needed to wear a pad even after her sling, but she thinks the leakage is a little bit worse.  She is following up with her urologist as scheduled in November 2012.  PHYSICAL EXAMINATION:  VITAL SIGNS:  Weight 214 pounds, which is down from 222 pounds preoperatively; height 5 feet; blood  pressure 122/80; pulse 68; respirations 18; temperature 97.8. GENERAL:  A well-nourished, well-developed, female in no acute distress. ABDOMEN:  Shows a well-healed surgical incision.  Incision is intact. There is no incisional hernia.  Abdomen is soft and nontender. PELVIC:  External genitalia is within normal limits.  The vagina is atrophic.  The vaginal cuff is visualized.  It is healed well as intact. It is nontender.  There is no masses, nodularity, tenderness, or fluctuance.  ASSESSMENT:  A 65 year old with stage IA, grade 1 endometrioid adenocarcinoma, who requires no postoperative adjuvant therapy.  PLAN:  Return to see [place] in 6 months.  She will require Pap smear on a yearly basis and that can be done at her 1-year anniversary.  After this next visit, we will begin alternating appointments with Dr. Ralph Dowdy.     [name] A. Duard Brady, MD     PAG/MEDQ  D:  12/29/2010  T:  12/29/2010  Job:  [id number]  cc:   Ernestina Penna, MD Fax: [phone]  Ernestina Penna, M.D. Fax: [phone]  Telford Nab, R.N. [address] [place], [place] [address]  Heloise Purpura, MD Fax: [phone] Electronically Signed by Cleda Mccreedy MD on 01/05/2011 01:43:14 PM

## 2011-02-07 LAB — CBC
Hemoglobin: 11.6 — ABNORMAL LOW
MCHC: 34.1
MCHC: 34.3
MCHC: 33.6
MCHC: 34.1
MCV: 94.9
MCV: 94.4
Platelets: 318
RDW: 12.5
RDW: 12.9
RDW: 12.7
RDW: 13

## 2011-02-07 LAB — POCT I-STAT 7, (LYTES, BLD GAS, ICA,H+H)
HCT: 29 — ABNORMAL LOW
Hemoglobin: 9.9 — ABNORMAL LOW
Potassium: 4.4
Sodium: 139
pH, Arterial: 7.411 — ABNORMAL HIGH

## 2011-02-07 LAB — URINE CULTURE: Special Requests: NEGATIVE

## 2011-02-07 LAB — ABO/RH: ABO/RH(D): AB POS

## 2011-02-07 LAB — COMPREHENSIVE METABOLIC PANEL
ALT: 13
AST: 19
Albumin: 2.8 — ABNORMAL LOW
BUN: 6
BUN: 7
Calcium: 8.3 — ABNORMAL LOW
Calcium: 8.9
Creatinine, Ser: 0.67
Creatinine, Ser: 0.68
GFR calc Af Amer: >60
Glucose, Bld: 123 — ABNORMAL HIGH
Sodium: 139
Total Protein: 6.3

## 2011-02-07 LAB — TYPE AND SCREEN
ABO/RH(D): AB POS
Antibody Screen: NEGATIVE

## 2011-02-07 LAB — BASIC METABOLIC PANEL
BUN: 7
BUN: 7
CO2: 27
CO2: 28
Calcium: 7.5 — ABNORMAL LOW
Calcium: 9.3
Chloride: 103
Creatinine, Ser: 0.55
Creatinine, Ser: 0.59
Glucose, Bld: 127 — ABNORMAL HIGH
Glucose, Bld: 169 — ABNORMAL HIGH

## 2011-02-07 LAB — CULTURE, BLOOD (ROUTINE X 2)
Culture: NO GROWTH
Culture: NO GROWTH

## 2011-02-07 LAB — BODY FLUID CULTURE
Culture: NO GROWTH
Gram Stain: NONE SEEN

## 2011-02-08 LAB — URINALYSIS, ROUTINE W REFLEX MICROSCOPIC
Bilirubin Urine: NEGATIVE
Hgb urine dipstick: NEGATIVE
Nitrite: NEGATIVE
Nitrite: NEGATIVE
Protein, ur: 100 — AB
Specific Gravity, Urine: 1.013
Specific Gravity, Urine: 1.02
Urobilinogen, UA: 0.2
pH: 6.5

## 2011-02-08 LAB — DIFFERENTIAL
Basophils Absolute: 0
Basophils Relative: 0
Eosinophils Absolute: 0.1
Eosinophils Relative: 2
Lymphocytes Relative: 14
Lymphs Abs: 1.7
Monocytes Relative: 6
Monocytes Relative: 6
Neutro Abs: 10.5 — ABNORMAL HIGH
Neutrophils Relative %: 77
Neutrophils Relative %: 82 — ABNORMAL HIGH

## 2011-02-08 LAB — BASIC METABOLIC PANEL
BUN: 10
Chloride: 103
GFR calc Af Amer: >60
GFR calc non Af Amer: >60
Potassium: 5.1

## 2011-02-08 LAB — URINE MICROSCOPIC-ADD ON

## 2011-02-08 LAB — POCT I-STAT, CHEM 8
BUN: 8
Calcium, Ion: 1.1 — ABNORMAL LOW
Creatinine, Ser: 0.8
Hemoglobin: 12.9
Sodium: 137
TCO2: 29

## 2011-02-08 LAB — CBC
HCT: 35.7 — ABNORMAL LOW
MCHC: 34.2
MCV: 92.7
MCV: 94.5
Platelets: 545 — ABNORMAL HIGH
Platelets: 658 — ABNORMAL HIGH
RBC: 3.86 — ABNORMAL LOW
RBC: 3.98
RDW: 13.3
WBC: 11.9 — ABNORMAL HIGH

## 2011-02-08 LAB — URINE CULTURE: Colony Count: NO GROWTH

## 2011-02-11 LAB — HEMOGLOBIN AND HEMATOCRIT, BLOOD: HCT: 37.3

## 2011-02-11 LAB — POCT HEMOGLOBIN-HEMACUE
Hemoglobin: 8.1 — ABNORMAL LOW
Operator id: 280881

## 2011-02-25 ENCOUNTER — Encounter (HOSPITAL_BASED_OUTPATIENT_CLINIC_OR_DEPARTMENT_OTHER)
Admission: RE | Admit: 2011-02-25 | Discharge: 2011-02-25 | Disposition: A | Discharge status: Home / Self Care | Payer: Medicare Other | Source: Ambulatory Visit | Attending: Orthopedic Surgery | Admitting: Orthopedic Surgery

## 2011-02-25 LAB — BASIC METABOLIC PANEL
Calcium: 9.7 mg/dL (ref 8.4–10.5)
GFR calc non Af Amer: >90 mL/min (ref >90)
Potassium: 4 mEq/L (ref 3.5–5.1)
Sodium: 139 mEq/L (ref 135–145)

## 2011-03-02 ENCOUNTER — Ambulatory Visit (HOSPITAL_BASED_OUTPATIENT_CLINIC_OR_DEPARTMENT_OTHER)
Admission: RE | Admit: 2011-03-02 | Discharge: 2011-03-02 | Disposition: A | Discharge status: Home / Self Care | Payer: Medicare Other | Source: Ambulatory Visit | Attending: Orthopedic Surgery | Admitting: Orthopedic Surgery

## 2011-03-02 DIAGNOSIS — IMO0002 Reserved for concepts with insufficient information to code with codable children: Secondary | ICD-10-CM | POA: Insufficient documentation

## 2011-03-02 DIAGNOSIS — E669 Obesity, unspecified: Secondary | ICD-10-CM | POA: Insufficient documentation

## 2011-03-02 DIAGNOSIS — M24573 Contracture, unspecified ankle: Secondary | ICD-10-CM | POA: Insufficient documentation

## 2011-03-02 DIAGNOSIS — I10 Essential (primary) hypertension: Secondary | ICD-10-CM | POA: Insufficient documentation

## 2011-03-02 DIAGNOSIS — Z01812 Encounter for preprocedural laboratory examination: Secondary | ICD-10-CM | POA: Insufficient documentation

## 2011-03-02 DIAGNOSIS — Y838 Other surgical procedures as the cause of abnormal reaction of the patient, or of later complication, without mention of misadventure at the time of the procedure: Secondary | ICD-10-CM | POA: Insufficient documentation

## 2011-03-02 DIAGNOSIS — M6240 Contracture of muscle, unspecified site: Secondary | ICD-10-CM | POA: Insufficient documentation

## 2011-03-02 LAB — POCT HEMOGLOBIN-HEMACUE: Hemoglobin: 14.9 g/dL (ref 12.0–15.0)

## 2011-03-28 ENCOUNTER — Other Ambulatory Visit (HOSPITAL_COMMUNITY): Payer: Self-pay | Admitting: Urology

## 2011-03-28 DIAGNOSIS — N135 Crossing vessel and stricture of ureter without hydronephrosis: Secondary | ICD-10-CM

## 2011-05-05 ENCOUNTER — Ambulatory Visit: Payer: Medicare Other | Attending: Orthopedic Surgery | Admitting: Physical Therapy

## 2011-05-05 DIAGNOSIS — IMO0001 Reserved for inherently not codable concepts without codable children: Secondary | ICD-10-CM | POA: Insufficient documentation

## 2011-05-05 DIAGNOSIS — R262 Difficulty in walking, not elsewhere classified: Secondary | ICD-10-CM | POA: Insufficient documentation

## 2011-05-05 DIAGNOSIS — R5381 Other malaise: Secondary | ICD-10-CM | POA: Insufficient documentation

## 2011-05-05 DIAGNOSIS — M25579 Pain in unspecified ankle and joints of unspecified foot: Secondary | ICD-10-CM | POA: Insufficient documentation

## 2011-05-09 ENCOUNTER — Ambulatory Visit: Payer: Medicare Other | Admitting: Physical Therapy

## 2011-05-12 ENCOUNTER — Ambulatory Visit: Payer: Medicare Other | Attending: Orthopedic Surgery | Admitting: *Deleted

## 2011-05-12 DIAGNOSIS — M25579 Pain in unspecified ankle and joints of unspecified foot: Secondary | ICD-10-CM | POA: Insufficient documentation

## 2011-05-12 DIAGNOSIS — R262 Difficulty in walking, not elsewhere classified: Secondary | ICD-10-CM | POA: Insufficient documentation

## 2011-05-12 DIAGNOSIS — R5381 Other malaise: Secondary | ICD-10-CM | POA: Insufficient documentation

## 2011-05-12 DIAGNOSIS — IMO0001 Reserved for inherently not codable concepts without codable children: Secondary | ICD-10-CM | POA: Insufficient documentation

## 2011-05-16 ENCOUNTER — Ambulatory Visit: Payer: Medicare Other | Admitting: Physical Therapy

## 2011-05-18 ENCOUNTER — Ambulatory Visit: Payer: Medicare Other | Admitting: Physical Therapy

## 2011-05-19 ENCOUNTER — Ambulatory Visit: Payer: Medicare Other | Admitting: Physical Therapy

## 2011-05-23 ENCOUNTER — Ambulatory Visit: Payer: Medicare Other | Admitting: Physical Therapy

## 2011-05-25 ENCOUNTER — Ambulatory Visit: Payer: Medicare Other | Admitting: Physical Therapy

## 2011-05-27 ENCOUNTER — Encounter: Payer: Medicare Other | Admitting: Physical Therapy

## 2011-05-30 ENCOUNTER — Encounter: Payer: Medicare Other | Admitting: Physical Therapy

## 2011-06-01 ENCOUNTER — Encounter: Payer: Medicare Other | Admitting: Physical Therapy

## 2011-06-03 ENCOUNTER — Encounter: Payer: Medicare Other | Admitting: Physical Therapy

## 2011-06-06 ENCOUNTER — Encounter (HOSPITAL_COMMUNITY)
Admission: RE | Admit: 2011-06-06 | Discharge: 2011-06-06 | Disposition: A | Discharge status: Home / Self Care | Payer: Medicare Other | Source: Ambulatory Visit | Attending: Urology | Admitting: Urology

## 2011-06-06 DIAGNOSIS — N35919 Unspecified urethral stricture, male, unspecified site: Secondary | ICD-10-CM | POA: Insufficient documentation

## 2011-06-06 MED ORDER — FUROSEMIDE 10 MG/ML IJ SOLN
40.0000 mg | Freq: Once | INTRAMUSCULAR | Status: DC
  Filled 2011-06-06: qty 4

## 2011-06-06 MED ORDER — TECHNETIUM TC 99M MERTIATIDE
16.0000 | Freq: Once | INTRAVENOUS | Status: AC | PRN
  Administered 2011-06-06: 16 via INTRAVENOUS

## 2011-06-14 ENCOUNTER — Encounter: Payer: Self-pay | Admitting: Gynecologic Oncology

## 2011-06-15 ENCOUNTER — Ambulatory Visit: Payer: Medicare Other | Attending: Gynecologic Oncology | Admitting: Gynecologic Oncology

## 2011-06-15 ENCOUNTER — Encounter: Payer: Self-pay | Admitting: Gynecologic Oncology

## 2011-06-15 VITALS — BP 148/78 | HR 70 | Temp 97.4°F | Resp 18 | Ht 60.0 in | Wt 215.3 lb

## 2011-06-15 DIAGNOSIS — IMO0002 Reserved for concepts with insufficient information to code with codable children: Secondary | ICD-10-CM

## 2011-06-15 DIAGNOSIS — Z96659 Presence of unspecified artificial knee joint: Secondary | ICD-10-CM | POA: Insufficient documentation

## 2011-06-15 DIAGNOSIS — Z800 Family history of malignant neoplasm of digestive organs: Secondary | ICD-10-CM | POA: Insufficient documentation

## 2011-06-15 DIAGNOSIS — Z85828 Personal history of other malignant neoplasm of skin: Secondary | ICD-10-CM | POA: Insufficient documentation

## 2011-06-15 DIAGNOSIS — C549 Malignant neoplasm of corpus uteri, unspecified: Secondary | ICD-10-CM | POA: Insufficient documentation

## 2011-06-15 DIAGNOSIS — Z79899 Other long term (current) drug therapy: Secondary | ICD-10-CM | POA: Insufficient documentation

## 2011-06-15 DIAGNOSIS — I10 Essential (primary) hypertension: Secondary | ICD-10-CM | POA: Insufficient documentation

## 2011-06-15 DIAGNOSIS — Z96649 Presence of unspecified artificial hip joint: Secondary | ICD-10-CM | POA: Insufficient documentation

## 2011-06-15 DIAGNOSIS — C541 Malignant neoplasm of endometrium: Secondary | ICD-10-CM

## 2011-06-15 DIAGNOSIS — Z8542 Personal history of malignant neoplasm of other parts of uterus: Secondary | ICD-10-CM | POA: Insufficient documentation

## 2011-06-15 NOTE — Patient Instructions (Signed)
Return to clinic in 6 months.

## 2011-06-15 NOTE — Progress Notes (Signed)
Consult Note: Gyn-Onc  [name] 66 y.o. female  CC:  Chief Complaint  Patient presents with  . Endometrioid Adenocarcinoma    Follow up    HPI:[name] is a very pleasant 66 year old with postmenopausal bleeding. She was seen by Dr. Ralph Dowdy, had an endometrial biopsy performed that revealed a grade 1 endometrioid adenocarcinoma. She was referred to [place].  After discussion of risks and benefits, she opted for surgery and on November 02, 2010 underwent diagnostic laparoscopy conversion to TAH-BSO,  lysis of adhesions for 20 minutes, and incisional hernia repair.   OPERATIVE FINDINGS: Included significant adhesive disease of the omentum and anterior abdominal wall, small bowel to the anterior abdominal wall, and a globular fibroid uterus. She had a 3-cm incisional hernia. Frozen section revealed minimal myometrial invasion. Fortunately, final pathology also revealed minimal myometrial invasion. She had a grade 1 endometrioid adenocarcinoma with focal superficial myometrial invasion of 0.1 cm with a myometrium of 1.8 cm or approximately 7%. She had a 5-cm tumor. No lymphovascular space  Involvement.   Interval History:  She comes in today for followup. Gynecologic stable she has no complaints. She is a skin cancer removed from her face last week. She still awaiting pathology. In addition, she had foot surgery about 4 months ago. She was unable to ambulate at all for 2 months, she had to wear a boot for 2 months. She's currently in physical therapy. She is pleased that she's only gained a pound and she is essentially been immobile for a 4 month period of time,  Review of Systems: 10 point review of systems is negative. She denies any abdominal or pelvic pain, vaginal bleeding, change in her bowel bladder habits.  She denies any chest pain, shortness of breath, nausea, vomiting, fevers, chills, headaches, or visual changes.  Current Meds:  Outpatient Encounter Prescriptions as of 06/15/2011    Medication Sig Dispense Refill  . AMITRIPTYLINE HCL PO Take 20 mg by mouth at bedtime.      . gabapentin (NEURONTIN) 300 MG capsule Take 300 mg by mouth at bedtime.      Marland Kitchen lisinopril-hydrochlorothiazide (PRINZIDE,ZESTORETIC) 20-12.5 MG per tablet Take 1 tablet by mouth daily.      Marland Kitchen VITAMIN D, ERGOCALCIFEROL, PO Take 2,000 mg by mouth daily.       . meloxicam (MOBIC) 15 MG tablet Take 15 mg by mouth daily.      Marland Kitchen NITROFURANTOIN PO Take 15 mg by mouth at bedtime.        Allergy:  Allergies  Allergen Reactions  . Cashew Nut Oil Hives  . Zofran Nausea Only    Social Hx:   History   Social History  . Marital Status: Widowed    Spouse Name: N/A    Number of Children: N/A  . Years of Education: N/A   Occupational History  . Not on file.   Social History Main Topics  . Smoking status: Never Smoker   . Smokeless tobacco: Not on file  . Alcohol Use: No  . Drug Use: No  . Sexually Active:    Other Topics Concern  . Not on file   Social History Narrative  . No narrative on file    Past Surgical Hx:  Past Surgical History  Procedure Date  . Abdominal hysterectomy 11/02/10    TAH, BSO, lysis of adhesions  . Incisional hernia repair 11/02/10  . Back surgery 1989, 1999, 2009  . Kidney surgery 2010    Left kidney  . Bladder  suspension 2003  . Foot surgery 06/2010    left foot surgery  . Joint replacement 09/2009    Right total knee  . Total hip arthroplasty 01/2010    Right total hip  . Cataract extraction, bilateral 2003    Past Medical Hx:  Past Medical History  Diagnosis Date  . Endometrioid adenocarcinoma   . Hypertension   . Morbid obesity     Family Hx:  Family History  Problem Relation Age of Onset  . Diabetes Mother   . Diabetes Father   . Colon cancer Sister   . Diabetes Sister     Vitals:  Blood pressure 148/78, pulse 70, temperature 97.4 F (36.3 C), temperature source Oral, resp. rate 18, height 5' (1.524 m), weight 215 lb 4.8 oz (97.659  kg).  Physical Exam: Well-nourished well-developed female in no acute distress.  HEENT: Well-healing lesions on left lower chin and right upper nose.  Neck: Supple no lymphadenopathy no thyromegaly.  Lungs: Clear to auscultation bilaterally.  Cardiovascular exam: Regular rate and rhythm.  Abdomen: Obese well-healed vertical midline incision. No appreciable hernia. No masses or nodularity. Nontender.   Extremities: Mild edema left lower extremity.  Pelvic: External genitalia within normal limits. The vagina is atrophic. Vaginal cuff is visualized. No visible lesions. Bimanual examination reveals no masses or nodularity rectal confirms to Assessment/Plan: ASSESSMENT: A 66 year old with stage IA, grade 1 endometrioid adenocarcinoma, who requires no postoperative adjuvant therapy.  PLAN: Return to see [place] in 6 months. She will require Pap smear on a yearly basis and that can be done at her 1-year anniversary.  Kelseigh Diver A., MD 06/15/2011, 10:55 AM

## 2011-12-28 ENCOUNTER — Encounter: Payer: Self-pay | Admitting: Gynecologic Oncology

## 2011-12-28 ENCOUNTER — Other Ambulatory Visit (HOSPITAL_COMMUNITY)
Admission: RE | Admit: 2011-12-28 | Discharge: 2011-12-28 | Disposition: A | Discharge status: Home / Self Care | Payer: Medicare Other | Source: Ambulatory Visit | Attending: Gynecologic Oncology | Admitting: Gynecologic Oncology

## 2011-12-28 ENCOUNTER — Ambulatory Visit: Payer: Medicare Other | Attending: Gynecologic Oncology | Admitting: Gynecologic Oncology

## 2011-12-28 VITALS — BP 130/64 | HR 62 | Temp 98.1°F | Resp 16 | Ht 60.0 in | Wt 211.4 lb

## 2011-12-28 DIAGNOSIS — Z85828 Personal history of other malignant neoplasm of skin: Secondary | ICD-10-CM | POA: Insufficient documentation

## 2011-12-28 DIAGNOSIS — C541 Malignant neoplasm of endometrium: Secondary | ICD-10-CM

## 2011-12-28 DIAGNOSIS — C549 Malignant neoplasm of corpus uteri, unspecified: Secondary | ICD-10-CM | POA: Insufficient documentation

## 2011-12-28 DIAGNOSIS — Z9079 Acquired absence of other genital organ(s): Secondary | ICD-10-CM | POA: Insufficient documentation

## 2011-12-28 DIAGNOSIS — I10 Essential (primary) hypertension: Secondary | ICD-10-CM | POA: Insufficient documentation

## 2011-12-28 DIAGNOSIS — Z96659 Presence of unspecified artificial knee joint: Secondary | ICD-10-CM | POA: Insufficient documentation

## 2011-12-28 DIAGNOSIS — Z124 Encounter for screening for malignant neoplasm of cervix: Secondary | ICD-10-CM | POA: Insufficient documentation

## 2011-12-28 DIAGNOSIS — Z90710 Acquired absence of both cervix and uterus: Secondary | ICD-10-CM | POA: Insufficient documentation

## 2011-12-28 DIAGNOSIS — Z96649 Presence of unspecified artificial hip joint: Secondary | ICD-10-CM | POA: Insufficient documentation

## 2011-12-28 DIAGNOSIS — Z79899 Other long term (current) drug therapy: Secondary | ICD-10-CM | POA: Insufficient documentation

## 2011-12-28 NOTE — Patient Instructions (Signed)
RTC 6 months

## 2011-12-28 NOTE — Progress Notes (Signed)
Consult Note: Gyn-Onc  [name] 66 y.o. female  CC:  Chief Complaint  Patient presents with  . Endometrioid Adenocarcinoma    Follow up    HPI:[name] is a very pleasant 67 year old with postmenopausal bleeding. She was seen by Dr. Ralph Dowdy, had an endometrial biopsy performed that revealed a grade 1 endometrioid adenocarcinoma. She was referred to [place]. After discussion of risks and benefits, she opted for surgery and on November 02, 2010 underwent diagnostic laparoscopy conversion to TAH-BSO,  lysis of adhesions for 20 minutes, and incisional hernia repair.   OPERATIVE FINDINGS: Included significant adhesive disease of the omentum and anterior abdominal wall, small bowel to the anterior abdominal wall, and a globular fibroid uterus. She had a 3-cm incisional hernia. Frozen section revealed minimal myometrial invasion. Fortunately, final pathology also revealed minimal myometrial invasion. She had a grade 1 endometrioid adenocarcinoma with focal superficial myometrial invasion of 0.1 cm with a myometrium of 1.8 cm or approximately 7%. She had a 5-cm tumor. No lymphovascular space Involvement.   Interval History:  She comes in today for followup. From a gynecologic standpoint she has been stable she has no complaints. She had a skin cancer removed from her face 2/13 and has not had any further issues with that. She has lost some weight and has been more active due to recovering from her orthopedic issues.  She is feeling very well. She had a MMG in 2/13 that was normal.  There is no interval change in her medical history, family history or medications.  Review of Systems: 10 point review of systems is negative. She denies any abdominal or pelvic pain, vaginal bleeding, change in her bowel bladder habits.  She denies any chest pain, shortness of breath, nausea, vomiting, fevers, chills, headaches, or visual changes.  Current Meds:  Outpatient Encounter Prescriptions as of 06/15/2011    Medication Sig Dispense Refill  . AMITRIPTYLINE HCL PO Take 20 mg by mouth at bedtime.      . gabapentin (NEURONTIN) 300 MG capsule Take 300 mg by mouth at bedtime.      Marland Kitchen lisinopril-hydrochlorothiazide (PRINZIDE,ZESTORETIC) 20-12.5 MG per tablet Take 1 tablet by mouth daily.      Marland Kitchen VITAMIN D, ERGOCALCIFEROL, PO Take 2,000 mg by mouth daily.       . meloxicam (MOBIC) 15 MG tablet Take 15 mg by mouth daily.      Marland Kitchen NITROFURANTOIN PO Take 15 mg by mouth at bedtime.        Allergy:  Allergies  Allergen Reactions  . Cashew Nut Oil Hives  . Zofran Nausea Only    Social Hx:   History   Social History  . Marital Status: Widowed    Spouse Name: N/A    Number of Children: N/A  . Years of Education: N/A   Occupational History  . Not on file.   Social History Main Topics  . Smoking status: Never Smoker   . Smokeless tobacco: Not on file  . Alcohol Use: No  . Drug Use: No  . Sexually Active:    Other Topics Concern  . Not on file   Social History Narrative  . No narrative on file    Past Surgical Hx:  Past Surgical History  Procedure Date  . Abdominal hysterectomy 11/02/10    TAH, BSO, lysis of adhesions  . Incisional hernia repair 11/02/10  . Back surgery 1989, 1999, 2009  . Kidney surgery 2010    Left kidney  . Bladder suspension 2003  .  Foot surgery 06/2010    left foot surgery  . Joint replacement 09/2009    Right total knee  . Total hip arthroplasty 01/2010    Right total hip  . Cataract extraction, bilateral 2003    Past Medical Hx:  Past Medical History  Diagnosis Date  . Endometrioid adenocarcinoma   . Hypertension   . Morbid obesity     Family Hx:  Family History  Problem Relation Age of Onset  . Diabetes Mother   . Diabetes Father   . Colon cancer Sister   . Diabetes Sister     Vitals:  Blood pressure 130/64, pulse 62, temperature 98.1, temperature source Oral, resp. rate 16, height 5' (1.524 m), weight 211  Physical Exam: Well-nourished  well-developed female in no acute distress.  HEENT:No lesions  Neck: Supple no lymphadenopathy no thyromegaly.  Lungs: Clear to auscultation bilaterally.  Cardiovascular exam: Regular rate and rhythm.  Abdomen: Obese well-healed vertical midline incision. No appreciable hernia. No masses or nodularity. Nontender.  Extremities: Mild edema left lower extremity.  Pelvic: External genitalia within normal limits. The vagina is atrophic. Vaginal cuff is visualized. No visible lesions. Pap smear submitted without difficulty. Bimanual examination reveals no masses or nodularity. Rectal confirms.  Assessment/Plan: ASSESSMENT: A 66 year old with stage IA, grade 1 endometrioid adenocarcinoma, who requires no postoperative adjuvant therapy.  PLAN: Return to see [place] in 6 months. I will follow up on the results of her pap smear from today.  Marcia Hartwell A., MD 06/15/2011, 10:55 AM

## 2012-01-03 ENCOUNTER — Telehealth: Payer: Self-pay | Admitting: Gynecologic Oncology

## 2012-01-03 NOTE — Telephone Encounter (Signed)
Pt notified about pap results: negative.  No questions or concerns voiced. 

## 2012-04-26 NOTE — ED Notes (Signed)
Pt. Reports nausea and vomiting since 6pm today.. Pt. Reports she has vomited approx. 4 times.  Pt. Reports last vomiting at 2225 tonight.  No distress noted in Pt.

## 2012-04-27 ENCOUNTER — Emergency Department (HOSPITAL_BASED_OUTPATIENT_CLINIC_OR_DEPARTMENT_OTHER): Payer: Medicare Other

## 2012-04-27 ENCOUNTER — Encounter (HOSPITAL_COMMUNITY): Payer: Self-pay | Admitting: Surgery

## 2012-04-27 ENCOUNTER — Inpatient Hospital Stay (HOSPITAL_BASED_OUTPATIENT_CLINIC_OR_DEPARTMENT_OTHER)
Admission: EM | Admit: 2012-04-27 | Discharge: 2012-05-01 | DRG: 390 | Disposition: A | Discharge status: Home / Self Care | Payer: Medicare Other | Attending: Surgery | Admitting: Surgery

## 2012-04-27 DIAGNOSIS — M546 Pain in thoracic spine: Secondary | ICD-10-CM | POA: Diagnosis present

## 2012-04-27 DIAGNOSIS — E876 Hypokalemia: Secondary | ICD-10-CM | POA: Diagnosis present

## 2012-04-27 DIAGNOSIS — K635 Polyp of colon: Secondary | ICD-10-CM

## 2012-04-27 DIAGNOSIS — E669 Obesity, unspecified: Secondary | ICD-10-CM | POA: Diagnosis present

## 2012-04-27 DIAGNOSIS — K432 Incisional hernia without obstruction or gangrene: Secondary | ICD-10-CM | POA: Diagnosis present

## 2012-04-27 DIAGNOSIS — I10 Essential (primary) hypertension: Secondary | ICD-10-CM | POA: Diagnosis present

## 2012-04-27 DIAGNOSIS — G8929 Other chronic pain: Secondary | ICD-10-CM | POA: Diagnosis present

## 2012-04-27 DIAGNOSIS — R109 Unspecified abdominal pain: Secondary | ICD-10-CM

## 2012-04-27 DIAGNOSIS — K56609 Unspecified intestinal obstruction, unspecified as to partial versus complete obstruction: Secondary | ICD-10-CM

## 2012-04-27 DIAGNOSIS — Z8542 Personal history of malignant neoplasm of other parts of uterus: Secondary | ICD-10-CM

## 2012-04-27 DIAGNOSIS — D126 Benign neoplasm of colon, unspecified: Secondary | ICD-10-CM | POA: Diagnosis present

## 2012-04-27 DIAGNOSIS — R112 Nausea with vomiting, unspecified: Secondary | ICD-10-CM

## 2012-04-27 LAB — CREATININE, SERUM
Creatinine, Ser: 0.6 mg/dL (ref 0.50–1.10)
GFR calc Af Amer: >90 mL/min (ref >90)
GFR calc non Af Amer: >90 mL/min (ref >90)

## 2012-04-27 LAB — CBC WITH DIFFERENTIAL/PLATELET
Basophils Relative: 0 % (ref 0–1)
Hemoglobin: 15.9 g/dL — ABNORMAL HIGH (ref 12.0–15.0)
Lymphs Abs: 3.4 10*3/uL (ref 0.7–4.0)
Monocytes Relative: 7 % (ref 3–12)
Neutro Abs: 12.1 10*3/uL — ABNORMAL HIGH (ref 1.7–7.7)
Neutrophils Relative %: 72 % (ref 43–77)
RBC: 4.95 MIL/uL (ref 3.87–5.11)
WBC: 16.7 10*3/uL — ABNORMAL HIGH (ref 4.0–10.5)

## 2012-04-27 LAB — COMPREHENSIVE METABOLIC PANEL
ALT: 19 U/L (ref 0–35)
Albumin: 4.5 g/dL (ref 3.5–5.2)
Alkaline Phosphatase: 57 U/L (ref 39–117)
BUN: 13 mg/dL (ref 6–23)
Chloride: 95 mEq/L — ABNORMAL LOW (ref 96–112)
Glucose, Bld: 146 mg/dL — ABNORMAL HIGH (ref 70–99)
Potassium: 3.5 mEq/L (ref 3.5–5.1)
Sodium: 139 mEq/L (ref 135–145)
Total Bilirubin: 1.1 mg/dL (ref 0.3–1.2)

## 2012-04-27 LAB — CBC
HCT: 40.4 % (ref 36.0–46.0)
Hemoglobin: 14 g/dL (ref 12.0–15.0)
MCHC: 34.7 g/dL (ref 30.0–36.0)
RDW: 12.7 % (ref 11.5–15.5)
WBC: 13.7 10*3/uL — ABNORMAL HIGH (ref 4.0–10.5)

## 2012-04-27 LAB — URINALYSIS, ROUTINE W REFLEX MICROSCOPIC
Leukocytes, UA: NEGATIVE
Nitrite: NEGATIVE
Specific Gravity, Urine: 1.022 (ref 1.005–1.030)
pH: 7.5 (ref 5.0–8.0)

## 2012-04-27 LAB — LIPASE, BLOOD: Lipase: 47 U/L (ref 11–59)

## 2012-04-27 MED ORDER — MAGIC MOUTHWASH
15.0000 mL | Freq: Four times a day (QID) | ORAL | Status: DC | PRN
  Filled 2012-04-27: qty 15

## 2012-04-27 MED ORDER — MENTHOL 3 MG MT LOZG
1.0000 | LOZENGE | OROMUCOSAL | Status: DC | PRN
  Filled 2012-04-27: qty 9

## 2012-04-27 MED ORDER — ONDANSETRON HCL 4 MG/2ML IJ SOLN: 4.0000 mg | Freq: Four times a day (QID) | INTRAMUSCULAR | Status: DC | PRN

## 2012-04-27 MED ORDER — ACETAMINOPHEN 650 MG RE SUPP: 650.0000 mg | Freq: Four times a day (QID) | RECTAL | Status: DC | PRN

## 2012-04-27 MED ORDER — FENTANYL CITRATE 0.05 MG/ML IJ SOLN
25.0000 ug | INTRAMUSCULAR | Status: DC | PRN
  Administered 2012-04-27 – 2012-04-28 (×5): 50 ug via INTRAVENOUS
  Administered 2012-04-29 – 2012-04-30 (×2): 25 ug via INTRAVENOUS
  Filled 2012-04-27 (×7): qty 2

## 2012-04-27 MED ORDER — IOHEXOL 300 MG/ML  SOLN
50.0000 mL | Freq: Once | INTRAMUSCULAR | Status: AC | PRN
  Administered 2012-04-27: 50 mL via ORAL

## 2012-04-27 MED ORDER — LIP MEDEX EX OINT
1.0000 "application " | TOPICAL_OINTMENT | Freq: Two times a day (BID) | CUTANEOUS | Status: DC
  Administered 2012-04-27 – 2012-05-01 (×8): 1 via TOPICAL
  Filled 2012-04-27 (×3): qty 7

## 2012-04-27 MED ORDER — PHENOL 1.4 % MT LIQD
2.0000 | OROMUCOSAL | Status: DC | PRN
  Filled 2012-04-27: qty 177

## 2012-04-27 MED ORDER — PROMETHAZINE HCL 25 MG RE SUPP
25.0000 mg | Freq: Four times a day (QID) | RECTAL | Status: DC | PRN
  Filled 2012-04-27: qty 1

## 2012-04-27 MED ORDER — LACTATED RINGERS IV BOLUS (SEPSIS)
1000.0000 mL | Freq: Once | INTRAVENOUS | Status: AC
  Administered 2012-04-27: 1000 mL via INTRAVENOUS

## 2012-04-27 MED ORDER — PROMETHAZINE HCL 25 MG/ML IJ SOLN
12.5000 mg | Freq: Four times a day (QID) | INTRAMUSCULAR | Status: DC | PRN
  Administered 2012-04-27 (×2): 25 mg via INTRAVENOUS
  Administered 2012-04-28: 12.5 mg via INTRAVENOUS
  Filled 2012-04-27 (×4): qty 1

## 2012-04-27 MED ORDER — BISACODYL 10 MG RE SUPP: 10.0000 mg | Freq: Two times a day (BID) | RECTAL | Status: DC | PRN

## 2012-04-27 MED ORDER — MORPHINE SULFATE 4 MG/ML IJ SOLN
4.0000 mg | Freq: Once | INTRAMUSCULAR | Status: AC
  Administered 2012-04-27: 4 mg via INTRAVENOUS
  Filled 2012-04-27: qty 1

## 2012-04-27 MED ORDER — METOCLOPRAMIDE HCL 5 MG/ML IJ SOLN: 5.0000 mg | Freq: Four times a day (QID) | INTRAMUSCULAR | Status: DC | PRN

## 2012-04-27 MED ORDER — MORPHINE SULFATE 4 MG/ML IJ SOLN
4.0000 mg | Freq: Once | INTRAMUSCULAR | Status: AC
  Administered 2012-04-27: 4 mg via INTRAVENOUS

## 2012-04-27 MED ORDER — SODIUM CHLORIDE 0.9 % IV BOLUS (SEPSIS)
500.0000 mL | Freq: Once | INTRAVENOUS | Status: AC
  Administered 2012-04-27: 01:00:00 via INTRAVENOUS

## 2012-04-27 MED ORDER — LACTATED RINGERS IV BOLUS (SEPSIS): 1000.0000 mL | Freq: Three times a day (TID) | INTRAVENOUS | Status: AC | PRN

## 2012-04-27 MED ORDER — HEPARIN SODIUM (PORCINE) 5000 UNIT/ML IJ SOLN
5000.0000 [IU] | Freq: Three times a day (TID) | INTRAMUSCULAR | Status: DC
Start: 2012-04-27 — End: 2012-05-01
  Administered 2012-04-27 – 2012-05-01 (×12): 5000 [IU] via SUBCUTANEOUS
  Filled 2012-04-27 (×16): qty 1

## 2012-04-27 MED ORDER — ALUM & MAG HYDROXIDE-SIMETH 200-200-20 MG/5ML PO SUSP: 30.0000 mL | Freq: Four times a day (QID) | ORAL | Status: DC | PRN

## 2012-04-27 MED ORDER — KCL IN DEXTROSE-NACL 40-5-0.9 MEQ/L-%-% IV SOLN
INTRAVENOUS | Status: DC
  Administered 2012-04-27 (×3): via INTRAVENOUS
  Administered 2012-04-28: 125 mL/h via INTRAVENOUS
  Administered 2012-04-28 – 2012-05-01 (×7): via INTRAVENOUS
  Filled 2012-04-27 (×15): qty 1000

## 2012-04-27 MED ORDER — ACETAMINOPHEN 10 MG/ML IV SOLN
1000.0000 mg | Freq: Four times a day (QID) | INTRAVENOUS | Status: AC | PRN
  Filled 2012-04-27: qty 100

## 2012-04-27 MED ORDER — IOHEXOL 300 MG/ML  SOLN
100.0000 mL | Freq: Once | INTRAMUSCULAR | Status: AC | PRN
  Administered 2012-04-27: 100 mL via INTRAVENOUS

## 2012-04-27 MED ORDER — MORPHINE SULFATE 4 MG/ML IJ SOLN
INTRAMUSCULAR | Status: AC
  Filled 2012-04-27: qty 1

## 2012-04-27 MED ORDER — PROMETHAZINE HCL 25 MG/ML IJ SOLN
12.5000 mg | Freq: Once | INTRAMUSCULAR | Status: AC
  Administered 2012-04-27: 12.5 mg via INTRAVENOUS
  Filled 2012-04-27: qty 1

## 2012-04-27 MED ORDER — DIPHENHYDRAMINE HCL 50 MG/ML IJ SOLN: 12.5000 mg | Freq: Four times a day (QID) | INTRAMUSCULAR | Status: DC | PRN

## 2012-04-27 NOTE — Progress Notes (Signed)
INITIAL NUTRITION ASSESSMENT  Pt meets criteria for severe MALNUTRITION in the context of chronic illness as evidenced by <75% estimated energy intake in the past few months with 11.1% weight loss in the past 3 months per pt report.  DOCUMENTATION CODES Per approved criteria  -Severe malnutrition in the context of chronic illness -Obesity Unspecified   INTERVENTION: - Diet advancement per MD - Will monitor  NUTRITION DIAGNOSIS: Inadequate oral intake related to small bowel obstruction as evidenced by CT of abdomen/pelvis on 04/27/12.   Goal: Advance diet as tolerated to regular diet.   Monitor:  Weights, labs, diet advancement, BM  Reason for Assessment: Nutrition risk   66 y.o. female  Admitting Dx: SBO (small bowel obstruction)  ASSESSMENT: Pt admitted with nausea, vomiting, and abdominal pain that started last night. Pt reports before then she was eating 2-3 meals/day. Pt reports 24 pound unintended weight loss in the past 3 months r/t getting new teeth that have not fit properly so she has been eating less. Pt with NGT in place and denies any nausea. Pt with output so far today. Pt reports last BM was Tuesday PTA. Pt with bloating. Pt with history of constipation. CT of abdomen/pelvis this morning showed small bowel obstruction.   Height: Ht Readings from Last 1 Encounters:  04/27/12 5' (1.524 m)    Weight: Wt Readings from Last 1 Encounters:  04/27/12 192 lb (87.091 kg)    Ideal Body Weight: 100 lb  % Ideal Body Weight: 192  Wt Readings from Last 10 Encounters:  04/27/12 192 lb (87.091 kg)  12/28/11 211 lb 6.4 oz (95.89 kg)  06/15/11 215 lb 4.8 oz (97.659 kg)    Usual Body Weight: 216 lb  % Usual Body Weight: 89  BMI:  Body mass index is 37.50 kg/(m^2).  Estimated Nutritional Needs: Kcal: 1350-1600 Protein: 45-55g Fluid: 1.3-1.6L   Diet Order: Clear Liquid  EDUCATION NEEDS: -No education needs identified at this time   Intake/Output  Summary (Last 24 hours) at 04/27/12 1121 Last data filed at 04/27/12 1000  Gross per 24 hour  Intake      0 ml  Output      0 ml  Net      0 ml    Last BM: 12/17 per pt  Labs:   Lab 04/27/12 0815 04/27/12 0051  NA -- 139  K -- 3.5  CL -- 95*  CO2 -- 31  BUN -- 13  CREATININE 0.60 0.70  CALCIUM -- 10.3  MG -- --  PHOS -- --  GLUCOSE -- 146*    CBG (last 3)  No results found for this basename: GLUCAP:3 in the last 72 hours  Scheduled Meds:   . heparin  5,000 Units Subcutaneous Q8H  . lip balm  1 application Topical BID    Continuous Infusions:   . dextrose 5 % and 0.9 % NaCl with KCl 40 mEq/L 125 mL/hr at 04/27/12 1610    Past Medical History  Diagnosis Date  . Endometrioid adenocarcinoma   . Hypertension   . Obesity (BMI 30-39.9) 04/27/2012    Past Surgical History  Procedure Date  . Abdominal hysterectomy 11/02/10    TAH, BSO, lysis of adhesions for endometrial cancer  . Incisional hernia repair 11/02/10  . Back surgery 1989, 1999, 2009    anterior L2-3, L3-4 arthrodesis  . Kidney surgery 2010    Left kidney  . Bladder suspension 2003  . Foot surgery 06/2010    left foot  surgery  . Joint replacement 09/2009    Right total knee  . Total hip arthroplasty 01/2010    Right total hip  . Cataract extraction, bilateral [address] [place], [place] [phone] Pager [phone] After Hours Pager

## 2012-04-27 NOTE — ED Notes (Signed)
ZOX:[id number]<AV> Expected date:04/27/12<BR> Expected time: 2:58 AM<BR> Means of arrival:Ambulance<BR> Comments:<BR> sbo from [facility]

## 2012-04-27 NOTE — ED Provider Notes (Signed)
Patient sent from [facility] to be seen by Dr. Michaell Cowing for small bowel obstruction. Her nausea and pain have been relieved by medications and nasogastric tube. She is in no acute distress. Her abdomen is soft and minimally tender at this time. Dr. gross has been contacted.  Hanley Seamen, MD 04/27/12 [phone]

## 2012-04-27 NOTE — H&P (Signed)
[name]  July 31, 1945 [id number]   This patient is a 66 y.o.female who presents today for surgical evaluation at the request of Dr. Leanne Chang, [facility] ED.   Reason for evaluation: Nausea vomiting abdominal pain.  Probable bowel obstruction  Pleasant obese female with prior abdominal surgeries.  Had endometrial cancer that required hysterectomy and bilateral salpingo-oophorectomy June 2012.  No post adjuvant chemotherapy needed.  Last seen in August with normal Pap smear and no new surprises.Struggles with intermittent constipation.  This month, has had episodes of crampy abdominal pain and nausea vomiting.  At least three times.  Last event was most intense.  Throwing up constantly.  No flatus or bowel movements.  Became more intense.  Her daughter, whom I have operated on, was concerned and brought her into the emergency room.  No history of prior bowel obstructions or admissions for nausea or vomiting.No personal nor family history of GI/colon cancer, inflammatory bowel disease, irritable bowel syndrome, allergy such as Celiac Sprue, dietary/dairy problems, colitis, ulcers nor gastritis.  No recent sick contacts/gastroenteritis.  No travel outside the country.  No changes in diet.Has had endoscopies with normal colonoscopies in the past by Dr. Jarold Motto with [facility] gastro[facility]ology.  Has a lot of chronic pain with numerous back surgeries.  Controlled with neuropathic medicines at this point.  The patient can walk about 10 minutes with a cane without difficulty.  Workup was concerning for bowel obstruction with transition zone in the pelvis.  Surgical consultation was requested.   Past Medical History  Diagnosis Date  . Endometrioid adenocarcinoma   . Hypertension   . Morbid obesity     Past Surgical History  Procedure Date  . Abdominal hysterectomy 11/02/10    TAH, BSO, lysis of adhesions  . Incisional hernia repair 11/02/10  . Back surgery 1989, 1999, 2009  . Kidney surgery 2010     Left kidney  . Bladder suspension 2003  . Foot surgery 06/2010    left foot surgery  . Joint replacement 09/2009    Right total knee  . Total hip arthroplasty 01/2010    Right total hip  . Cataract extraction, bilateral 2003    History   Social History  . Marital Status: Widowed    Spouse Name: N/A    Number of Children: N/A  . Years of Education: N/A   Occupational History  . Not on file.   Social History Main Topics  . Smoking status: Never Smoker   . Smokeless tobacco: Not on file  . Alcohol Use: No  . Drug Use: No  . Sexually Active:    Other Topics Concern  . Not on file   Social History Narrative  . No narrative on file    Family History  Problem Relation Age of Onset  . Diabetes Mother   . Diabetes Father   . Colon cancer Sister   . Diabetes Sister     Current Facility-Administered Medications  Medication Dose Route Frequency Provider Last Rate Last Dose  . acetaminophen (OFIRMEV) IV 1,000 mg  1,000 mg Intravenous Q6H PRN Ardeth Sportsman, MD      . acetaminophen (TYLENOL) suppository 650 mg  650 mg Rectal Q6H PRN Ardeth Sportsman, MD      . alum & mag hydroxide-simeth (MAALOX/MYLANTA) 200-200-20 MG/5ML suspension 30 mL  30 mL Oral Q6H PRN Ardeth Sportsman, MD      . bisacodyl (DULCOLAX) suppository 10 mg  10 mg Rectal Q12H PRN Ardeth Sportsman, MD      .  diphenhydrAMINE (BENADRYL) injection 12.5-25 mg  12.5-25 mg Intravenous Q6H PRN Ardeth Sportsman, MD      . fentaNYL (SUBLIMAZE) injection 25-50 mcg  25-50 mcg Intravenous Q1H PRN Ardeth Sportsman, MD      . lactated ringers bolus 1,000 mL  1,000 mL Intravenous Once Ardeth Sportsman, MD      . lactated ringers bolus 1,000 mL  1,000 mL Intravenous Q8H PRN Ardeth Sportsman, MD      . lip balm (CARMEX) ointment 1 application  1 application Topical BID Ardeth Sportsman, MD      . magic mouthwash  15 mL Oral QID PRN Ardeth Sportsman, MD      . menthol-cetylpyridinium (CEPACOL) lozenge 3 mg  1 lozenge Oral PRN Ardeth Sportsman, MD      . metoCLOPramide (REGLAN) injection 5-10 mg  5-10 mg Intravenous Q6H PRN Ardeth Sportsman, MD      . ondansetron (ZOFRAN) injection 4-8 mg  4-8 mg Intravenous Q6H PRN Ardeth Sportsman, MD      . phenol (CHLORASEPTIC) mouth spray 2 spray  2 spray Mouth/Throat PRN Ardeth Sportsman, MD      . promethazine (PHENERGAN) injection 12.5-25 mg  12.5-25 mg Intravenous Q6H PRN Ardeth Sportsman, MD      . promethazine (PHENERGAN) suppository 25 mg  25 mg Rectal Q6H PRN Ardeth Sportsman, MD       Current Outpatient Prescriptions  Medication Sig Dispense Refill  . amitriptyline (ELAVIL) 25 MG tablet Take 25 mg by mouth at bedtime.      . gabapentin (NEURONTIN) 300 MG capsule Take 300 mg by mouth at bedtime.      Marland Kitchen lisinopril-hydrochlorothiazide (PRINZIDE,ZESTORETIC) 20-12.5 MG per tablet Take 1 tablet by mouth every morning.       . meloxicam (MOBIC) 15 MG tablet Take 15 mg by mouth every morning.       . simvastatin (ZOCOR) 10 MG tablet Take 10 mg by mouth at bedtime.         Allergies  Allergen Reactions  . Cashew Nut Oil Hives  . Zofran Hives and Nausea Only    ROS: Constitutional:  No fevers, chills, sweats.  Weight loss 20 lbs this past month with poor PO intake Eyes:  No vision changes, No discharge HENT:  No sore throats, nasal drainage Lymph: No neck swelling, No bruising easily Pulmonary:  No cough, productive sputum CV: No orthopnea, PND  Patient walks 10 minutes for about 1/2 miles without difficulty.  No exertional chest/neck/shoulder/arm pain. GI: No personal nor family history of GI/colon cancer, inflammatory bowel disease, irritable bowel syndrome, allergy such as Celiac Sprue, dietary/dairy problems, colitis, ulcers nor gastritis.  No recent sick contacts/gastroenteritis.  No travel outside the country.  No changes in diet. Renal: No UTIs, No hematuria Genital:  No drainage, bleeding, masses Musculoskeletal: No severe joint pain.  Good ROM major joints Skin:  No sores  or lesions.  No rashes Heme/Lymph:  No easy bleeding.  No swollen lymph nodes Neuro: No focal weakness/numbness.  No seizures Psych: No suicidal ideation.  No hallucinations  BP 131/62  Pulse 63  Temp 98.7 F (37.1 C) (Oral)  Resp 14  Ht 5' (1.524 m)  Wt 192 lb (87.091 kg)  BMI 37.50 kg/m2  SpO2 92%  Physical Exam: General: Pt awake/alert/oriented x4 in no major acute distress Eyes: PERRL, normal EOM. Sclera nonicteric Neuro: CN II-XII intact w/o focal sensory/motor deficits. Lymph: No head/neck/groin lymphadenopathy  Psych:  No delerium/psychosis/paranoia HENT: Normocephalic, Mucus membranes moist.  No thrush.  NGT in place Neck: Supple, No tracheal deviation Chest: No pain.  Good respiratory excursion. CV:  Pulses intact.  Regular rhythm Abdomen: Soft, Obese with clean panniculus.  Mildly distended.  Mildly tender LLQ - no peritonitis.  Much better after NGT placed per pt.  No incarcerated hernias. Ext:  SCDs BLE.  No significant edema.  No cyanosis Skin: No petechiae / purpurea.  No major sores Musculoskeletal: No severe joint pain.  Good ROM major joints   Results:   Labs: Results for orders placed during the hospital encounter of 04/27/12 (from the past 48 hour(s))  URINALYSIS, ROUTINE W REFLEX MICROSCOPIC     Status: Abnormal   Collection Time   04/27/12 12:07 AM      Component Value Range Comment   Color, Urine YELLOW  YELLOW    APPearance CLEAR  CLEAR    Specific Gravity, Urine 1.022  1.005 - 1.030    pH 7.5  5.0 - 8.0    Glucose, UA NEGATIVE  NEGATIVE mg/dL    Hgb urine dipstick NEGATIVE  NEGATIVE    Bilirubin Urine NEGATIVE  NEGATIVE    Ketones, ur 15 (*) NEGATIVE mg/dL    Protein, ur NEGATIVE  NEGATIVE mg/dL    Urobilinogen, UA 1.0  0.0 - 1.0 mg/dL    Nitrite NEGATIVE  NEGATIVE    Leukocytes, UA NEGATIVE  NEGATIVE MICROSCOPIC NOT DONE ON URINES WITH NEGATIVE PROTEIN, BLOOD, LEUKOCYTES, NITRITE, OR GLUCOSE <1000 mg/dL.  CBC WITH DIFFERENTIAL     Status:  Abnormal   Collection Time   04/27/12 12:51 AM      Component Value Range Comment   WBC 16.7 (*) 4.0 - 10.5 K/uL    RBC 4.95  3.87 - 5.11 MIL/uL    Hemoglobin 15.9 (*) 12.0 - 15.0 g/dL    HCT 40.9  81.1 - 91.4 %    MCV 90.9  78.0 - 100.0 fL    MCH 32.1  26.0 - 34.0 pg    MCHC 35.3  30.0 - 36.0 g/dL    RDW 78.2  95.6 - 21.3 %    Platelets 341  150 - 400 K/uL    Neutrophils Relative 72  43 - 77 %    Neutro Abs 12.1 (*) 1.7 - 7.7 K/uL    Lymphocytes Relative 20  12 - 46 %    Lymphs Abs 3.4  0.7 - 4.0 K/uL    Monocytes Relative 7  3 - 12 %    Monocytes Absolute 1.2 (*) 0.1 - 1.0 K/uL    Eosinophils Relative 0  0 - 5 %    Eosinophils Absolute 0.0  0.0 - 0.7 K/uL    Basophils Relative 0  0 - 1 %    Basophils Absolute 0.0  0.0 - 0.1 K/uL   COMPREHENSIVE METABOLIC PANEL     Status: Abnormal   Collection Time   04/27/12 12:51 AM      Component Value Range Comment   Sodium 139  135 - 145 mEq/L    Potassium 3.5  3.5 - 5.1 mEq/L    Chloride 95 (*) 96 - 112 mEq/L    CO2 31  19 - 32 mEq/L    Glucose, Bld 146 (*) 70 - 99 mg/dL    BUN 13  6 - 23 mg/dL    Creatinine, Ser 0.86  0.50 - 1.10 mg/dL    Calcium 57.8  8.4 - 10.5  mg/dL    Total Protein 7.8  6.0 - 8.3 g/dL    Albumin 4.5  3.5 - 5.2 g/dL    AST 15  0 - 37 U/L    ALT 19  0 - 35 U/L    Alkaline Phosphatase 57  39 - 117 U/L    Total Bilirubin 1.1  0.3 - 1.2 mg/dL    GFR calc non Af Amer 88 (*) >90 mL/min    GFR calc Af Amer >90  >90 mL/min   LIPASE, BLOOD     Status: Normal   Collection Time   04/27/12 12:51 AM      Component Value Range Comment   Lipase 47  11 - 59 U/L     Imaging / Studies: Dg Chest 1 View  04/27/2012  *RADIOLOGY REPORT*  Clinical Data: Nausea/vomiting  CHEST - 1 VIEW  Comparison: 03/05/2009  Findings: Lungs are essentially clear. No pleural effusion or pneumothorax.  Cardiomediastinal silhouette is within normal limits.  IMPRESSION: No evidence of acute cardiopulmonary disease.   Original Report  Authenticated By: Charline Bills, M.D.    Ct Abdomen Pelvis W Contrast  04/27/2012  *RADIOLOGY REPORT*  Clinical Data: Left lower quadrant pain  CT ABDOMEN AND PELVIS WITH CONTRAST  Technique:  Multidetector CT imaging of the abdomen and pelvis was performed following the standard protocol during bolus administration of intravenous contrast.  Contrast: 50mL OMNIPAQUE IOHEXOL 300 MG/ML  SOLN, OMNIPAQUE IOHEXOL 300 MG/ML  SOLN  Comparison: 12/08/2008  Findings: Mild dependent atelectasis.  Heart size within normal limits.  Mild aortic valve calcification and coronary artery calcification.  Low attenuation of the liver can be seen with fatty infiltration. Focal hypoattenuation adjacent to the falciform ligament may reflect an area of more focal fat.  Unremarkable spleen, pancreas, adrenal glands.  Lobular renal contours.  Left renal pelvis fullness is similar to prior.  No hydroureter.  Symmetric renal enhancement.  Colon is decompressed as are distal small bowel loops.  Appendix within normal limits.  There are dilated loops of small bowel in the lower abdomen and pelvis. Measuring up to 3.4 cm.  Transition point left lower quadrant.  Small amount of fluid into the left pericolic gutter. No free intraperitoneal air. Fat containing periumbilical/incisional hernia is along the anterior abdominal wall.  Thin-walled bladder.  Absent uterus.  Trace free fluid within the pelvis.  No adnexal mass.  There is scattered atherosclerotic calcification of the aorta and its branches. No aneurysmal dilatation.  Right hip arthroplasty.  Lumbar hardware.  Multilevel degenerative change.  IMPRESSION: Small bowel obstruction pattern with dilated small bowel loops up to 3.4 cm. Transition point left lower quadrant.  Small amount of intraperitoneal fluid may be reactive.  Recommend surgical consultation.   Original Report Authenticated By: Jearld Lesch, M.D.     Medications / Allergies: per  chart  Antibiotics: Anti-infectives    None      Assessment  Priscila Bean Barra  66 y.o. female       Problem List:  Principal Problem:  *SBO (small bowel obstruction) Active Problems:  Incisional hernia  Hypertension  Obesity (BMI 30-39.9)  Chronic pain  Hypokalemia  Colon polyp, hyperplastic   SBO most likely due to adhesions  Plan:  Admit  IV fluid resuscitation  Correct hypokalemia  Nasogastric tube decompression  We will follow and see she does.  Hopefully this can resolve nonoperatively.  I concerned that may fail given her history of worsening nausea vomiting over the past  month.  There is no evidence of ischemia or pneumatosis or perforation on CT scan.  Her exam is not concerning for surgical emergency.  She is not in shock.  Thickened is reasonable to observe closely and see if she will resolve nonoperatively.  At this recommendation the patient and her daughter.  Questions were answered.  They expressed understanding and appreciation.  Control hypertension.  When necessary beta blocker for now  Follow chronic pain which may be more a challenge without oral meds  -VTE prophylaxis- SCDs, etc -mobilize as tolerated to help recovery    Ardeth Sportsman, M.D., F.A.C.S. Gastrointestinal and Minimally Invasive Surgery [facility] [address], [address] [place], [place] [address] [phone] Main / Paging [phone] Voice Mail   04/27/2012  CARE TEAM:  PCP: Rudi Heap, MD  Outpatient Care Team: Patient Care Team: Ernestina Penna, MD as PCP - General (Family Medicine)  Inpatient Treatment Team: Treatment Team: Attending Provider: April Smitty Cords, MD; Registered Nurse: Heber [place], RN; Technician: Richardean Chimera, EMT; Consulting Physician: Bishop Limbo, MD

## 2012-04-27 NOTE — ED Notes (Signed)
Report given to [facility] and Geophysicist/field seismologist at [employer].  Discussed POC with pt

## 2012-04-27 NOTE — ED Provider Notes (Signed)
History     CSN: [id number]  Arrival date & time 04/26/12  2351   First MD Initiated Contact with Patient 04/27/12 0014      Chief Complaint  Patient presents with  . Emesis    (Consider location/radiation/quality/duration/timing/severity/associated sxs/prior treatment) Patient is a 66 y.o. female presenting with vomiting and abdominal pain. The history is provided by the patient.  Emesis  This is a new problem. The current episode started 6 to 12 hours ago. The problem occurs 2 to 4 times per day. The problem has not changed since onset.The emesis has an appearance of stomach contents. There has been no fever. Associated symptoms include abdominal pain. Pertinent negatives include no chills, no diarrhea, no fever and no sweats.  Abdominal Pain The primary symptoms of the illness include abdominal pain, nausea and vomiting. The primary symptoms of the illness do not include fever or diarrhea. The current episode started 6 to 12 hours ago. The onset of the illness was sudden. The problem has not changed since onset. The abdominal pain began 6 to 12 hours ago. The pain came on suddenly. The abdominal pain has been unchanged since its onset. The abdominal pain is located in the LLQ and LUQ. The abdominal pain does not radiate. The severity of the abdominal pain is 10/10. The abdominal pain is relieved by nothing. The abdominal pain is exacerbated by vomiting.  Nausea began yesterday.  The vomiting began yesterday. Vomiting occurs 2 to 5 times per day.  The patient has not had a change in bowel habit. Risk factors for an acute abdominal problem include being elderly and a history of abdominal surgery. Additional symptoms associated with the illness include constipation. Symptoms associated with the illness do not include chills or anorexia. Significant associated medical issues do not include inflammatory bowel disease.    Past Medical History  Diagnosis Date  . Endometrioid adenocarcinoma    . Hypertension   . Morbid obesity     Past Surgical History  Procedure Date  . Abdominal hysterectomy 11/02/10    TAH, BSO, lysis of adhesions  . Incisional hernia repair 11/02/10  . Back surgery 1989, 1999, 2009  . Kidney surgery 2010    Left kidney  . Bladder suspension 2003  . Foot surgery 06/2010    left foot surgery  . Joint replacement 09/2009    Right total knee  . Total hip arthroplasty 01/2010    Right total hip  . Cataract extraction, bilateral 2003    Family History  Problem Relation Age of Onset  . Diabetes Mother   . Diabetes Father   . Colon cancer Sister   . Diabetes Sister     History  Substance Use Topics  . Smoking status: Never Smoker   . Smokeless tobacco: Not on file  . Alcohol Use: No    OB History    Grav Para Term Preterm Abortions TAB SAB Ect Mult Living                  Review of Systems  Constitutional: Negative for fever and chills.  Gastrointestinal: Positive for nausea, vomiting, abdominal pain and constipation. Negative for diarrhea and anorexia.  All other systems reviewed and are negative.    Allergies  Cashew nut oil and Zofran  Home Medications   Current Outpatient Rx  Name  Route  Sig  Dispense  Refill  . AMITRIPTYLINE HCL PO   Oral   Take 20 mg by mouth at bedtime.         Marland Kitchen  GABAPENTIN 300 MG PO CAPS   Oral   Take 300 mg by mouth at bedtime.         Marland Kitchen LISINOPRIL-HYDROCHLOROTHIAZIDE 20-12.5 MG PO TABS   Oral   Take 1 tablet by mouth daily.         . MELOXICAM 15 MG PO TABS   Oral   Take 15 mg by mouth daily.         Marland Kitchen NITROFURANTOIN PO   Oral   Take 15 mg by mouth at bedtime.         Marland Kitchen VITAMIN D (ERGOCALCIFEROL) PO   Oral   Take 2,000 mg by mouth daily.            BP 156/74  Pulse 65  Temp 98.9 F (37.2 C) (Oral)  Resp 18  Ht 5' (1.524 m)  Wt 192 lb (87.091 kg)  BMI 37.50 kg/m2  SpO2 99%  Physical Exam  Constitutional: She is oriented to person, place, and time. She appears  well-developed and well-nourished. No distress.  HENT:  Head: Normocephalic and atraumatic.  Mouth/Throat: Oropharynx is clear and moist.  Eyes: Conjunctivae normal are normal. Pupils are equal, round, and reactive to light.  Neck: Normal range of motion. Neck supple.  Cardiovascular: Normal rate and regular rhythm.   Pulmonary/Chest: Effort normal and breath sounds normal. She has no wheezes. She has no rales.  Abdominal: Soft. She exhibits no fluid wave and no mass. Bowel sounds are decreased. There is tenderness. There is no rebound.  Musculoskeletal: Normal range of motion.  Neurological: She is alert and oriented to person, place, and time.  Skin: Skin is warm and dry.  Psychiatric: She has a normal mood and affect.    ED Course  Procedures (including critical care time)  Labs Reviewed  URINALYSIS, ROUTINE W REFLEX MICROSCOPIC - Abnormal; Notable for the following:    Ketones, ur 15 (*)     All other components within normal limits  CBC WITH DIFFERENTIAL - Abnormal; Notable for the following:    WBC 16.7 (*)     Hemoglobin 15.9 (*)     Neutro Abs 12.1 (*)     Monocytes Absolute 1.2 (*)     All other components within normal limits  COMPREHENSIVE METABOLIC PANEL  LIPASE, BLOOD   No results found.   No diagnosis found.    MDM  NGT for SBO  NPO  Will redose pain medication  Case d/w Dr. Michaell Cowing of general surgery, please transfer to the [facility] ED for evaluation by [facility]       Chantell Kunkler K Aviyon Hocevar-Rasch, MD 04/27/12 [phone]

## 2012-04-27 NOTE — ED Notes (Signed)
Patient received from [facility], with 20g Left AC with NS infusing, NG tube to Left nare.

## 2012-04-27 NOTE — ED Notes (Signed)
Dr. Michaell Cowing notified of pt's arrival from [facility]

## 2012-04-27 NOTE — ED Notes (Signed)
Report given to [employer] on 5w

## 2012-04-28 NOTE — Progress Notes (Signed)
Subjective: Feels better. No flatus. Minimal belly pain  Objective: Vital signs in last 24 hours: Temp:  [97.9 F (36.6 C)-98.4 F (36.9 C)] 97.9 F (36.6 C) (12/21 0615) Pulse Rate:  [52-63] 61  (12/21 0615) Resp:  [16-20] 18  (12/21 0615) BP: (134-163)/(72-92) 153/77 mmHg (12/21 0615) SpO2:  [93 %-97 %] 94 % (12/21 0615) Weight:  [192 lb (87.091 kg)] 192 lb (87.091 kg) (12/20 0755)    Intake/Output from previous day: 12/20 0701 - 12/21 0700 In: 2695.8 [I.V.:2695.8] Out: 1600 [Urine:1050; Emesis/NG output:550] Intake/Output this shift:    Alert, nad Obese, soft, nontender. No bowel sounds Ng - clear/red  Lab Results:   Basename 04/27/12 0815 04/27/12 0051  WBC 13.7* 16.7*  HGB 14.0 15.9*  HCT 40.4 45.0  PLT 322 341   BMET  Basename 04/27/12 0815 04/27/12 0051  NA -- 139  K -- 3.5  CL -- 95*  CO2 -- 31  GLUCOSE -- 146*  BUN -- 13  CREATININE 0.60 0.70  CALCIUM -- 10.3   PT/INR No results found for this basename: LABPROT:2,INR:2 in the last 72 hours ABG No results found for this basename: PHART:2,PCO2:2,PO2:2,HCO3:2 in the last 72 hours  Studies/Results: Dg Chest 1 View  04/27/2012  *RADIOLOGY REPORT*  Clinical Data: Nausea/vomiting  CHEST - 1 VIEW  Comparison: 03/05/2009  Findings: Lungs are essentially clear. No pleural effusion or pneumothorax.  Cardiomediastinal silhouette is within normal limits.  IMPRESSION: No evidence of acute cardiopulmonary disease.   Original Report Authenticated By: Charline Bills, M.D.    Ct Abdomen Pelvis W Contrast  04/27/2012  *RADIOLOGY REPORT*  Clinical Data: Left lower quadrant pain  CT ABDOMEN AND PELVIS WITH CONTRAST  Technique:  Multidetector CT imaging of the abdomen and pelvis was performed following the standard protocol during bolus administration of intravenous contrast.  Contrast: 50mL OMNIPAQUE IOHEXOL 300 MG/ML  SOLN, OMNIPAQUE IOHEXOL 300 MG/ML  SOLN  Comparison: 12/08/2008  Findings: Mild dependent  atelectasis.  Heart size within normal limits.  Mild aortic valve calcification and coronary artery calcification.  Low attenuation of the liver can be seen with fatty infiltration. Focal hypoattenuation adjacent to the falciform ligament may reflect an area of more focal fat.  Unremarkable spleen, pancreas, adrenal glands.  Lobular renal contours.  Left renal pelvis fullness is similar to prior.  No hydroureter.  Symmetric renal enhancement.  Colon is decompressed as are distal small bowel loops.  Appendix within normal limits.  There are dilated loops of small bowel in the lower abdomen and pelvis. Measuring up to 3.4 cm.  Transition point left lower quadrant.  Small amount of fluid into the left pericolic gutter. No free intraperitoneal air. Fat containing periumbilical/incisional hernia is along the anterior abdominal wall.  Thin-walled bladder.  Absent uterus.  Trace free fluid within the pelvis.  No adnexal mass.  There is scattered atherosclerotic calcification of the aorta and its branches. No aneurysmal dilatation.  Right hip arthroplasty.  Lumbar hardware.  Multilevel degenerative change.  IMPRESSION: Small bowel obstruction pattern with dilated small bowel loops up to 3.4 cm. Transition point left lower quadrant.  Small amount of intraperitoneal fluid may be reactive.  Recommend surgical consultation.   Original Report Authenticated By: Jearld Lesch, M.D.     Anti-infectives: Anti-infectives    None      Assessment/Plan: pSBO  Majority of NG output is what pt has been drinking  Still obstipated. Would leave NG today, can take sips Repeat abd xrays in AM to  evaluate bowel gas pattern; hopefully can take ng out in am Ambulate   LOS: 1 day    Atilano Ina 04/28/2012

## 2012-04-29 ENCOUNTER — Inpatient Hospital Stay (HOSPITAL_COMMUNITY): Payer: Medicare Other

## 2012-04-29 LAB — BASIC METABOLIC PANEL
BUN: 4 mg/dL — ABNORMAL LOW (ref 6–23)
Chloride: 104 mEq/L (ref 96–112)
GFR calc Af Amer: >90 mL/min (ref >90)
GFR calc non Af Amer: 89 mL/min — ABNORMAL LOW (ref >90)
Potassium: 4.3 mEq/L (ref 3.5–5.1)
Sodium: 138 mEq/L (ref 135–145)

## 2012-04-29 LAB — CBC
Hemoglobin: 13.9 g/dL (ref 12.0–15.0)
MCH: 31.4 pg (ref 26.0–34.0)
Platelets: 284 10*3/uL (ref 150–400)
RBC: 4.42 MIL/uL (ref 3.87–5.11)

## 2012-04-29 LAB — MAGNESIUM: Magnesium: 2 mg/dL (ref 1.5–2.5)

## 2012-04-29 MED ORDER — LISINOPRIL-HYDROCHLOROTHIAZIDE 20-12.5 MG PO TABS: 1.0000 | ORAL_TABLET | Freq: Every morning | ORAL | Status: DC

## 2012-04-29 MED ORDER — LISINOPRIL 20 MG PO TABS
20.0000 mg | ORAL_TABLET | Freq: Every day | ORAL | Status: DC
  Administered 2012-04-29 – 2012-05-01 (×3): 20 mg via ORAL
  Filled 2012-04-29 (×4): qty 1

## 2012-04-29 MED ORDER — HYDROCHLOROTHIAZIDE 12.5 MG PO CAPS
12.5000 mg | ORAL_CAPSULE | Freq: Every day | ORAL | Status: DC
  Administered 2012-04-29 – 2012-05-01 (×2): 12.5 mg via ORAL
  Filled 2012-04-29 (×4): qty 1

## 2012-04-29 NOTE — Progress Notes (Signed)
  Subjective: Feels good. No flatus. No n/v. Ng still in on LIWS but been drinking clears. No abd pain except on occasion  Objective: Vital signs in last 24 hours: Temp:  [97.9 F (36.6 C)-98.1 F (36.7 C)] 97.9 F (36.6 C) (12/22 0559) Pulse Rate:  [55-60] 55  (12/22 0559) Resp:  [18-20] 18  (12/22 0559) BP: (131-146)/(55-87) 146/78 mmHg (12/22 0559) SpO2:  [94 %-98 %] 96 % (12/22 0559) Last BM Date: 04/24/12  Intake/Output from previous day: 12/21 0701 - 12/22 0700 In: 3720 [P.O.:720; I.V.:3000] Out: 3700 [Urine:3050; Emesis/NG output:650] Intake/Output this shift: Total I/O In: -  Out: 550 [Urine:550]  Alert, nad cta b/l Reg Soft, obese, hypoBS. nontender No edema  Lab Results:   Basename 04/27/12 0815 04/27/12 0051  WBC 13.7* 16.7*  HGB 14.0 15.9*  HCT 40.4 45.0  PLT 322 341   BMET  Basename 04/27/12 0815 04/27/12 0051  NA -- 139  K -- 3.5  CL -- 95*  CO2 -- 31  GLUCOSE -- 146*  BUN -- 13  CREATININE 0.60 0.70  CALCIUM -- 10.3   PT/INR No results found for this basename: LABPROT:2,INR:2 in the last 72 hours ABG No results found for this basename: PHART:2,PCO2:2,PO2:2,HCO3:2 in the last 72 hours  Studies/Results: No results found.  Anti-infectives: Anti-infectives    None      Assessment/Plan: HTN - blood pressure ok VTE prophylaxis - cont subcu heparin pSBO - reviewed plain xray from today. Sort of gasless abdomen. Air in colon. Contrast now in colon. Believe pSBO is slowly getting better. No fever, no tachy, no abd pain. Will cont non-operative mgmt for now. Will clamp NG tube intermittently today and see how pt does.  F/E/N - check labs  Mary Sella. Andrey Campanile, MD, FACS General, Bariatric, & Minimally Invasive Surgery [facility], [place]   LOS: 2 days    Atilano Ina 04/29/2012

## 2012-04-30 ENCOUNTER — Inpatient Hospital Stay (HOSPITAL_COMMUNITY): Payer: Medicare Other

## 2012-04-30 LAB — BASIC METABOLIC PANEL
CO2: 24 mEq/L (ref 19–32)
Calcium: 8.5 mg/dL (ref 8.4–10.5)
GFR calc non Af Amer: 89 mL/min — ABNORMAL LOW (ref >90)
Glucose, Bld: 142 mg/dL — ABNORMAL HIGH (ref 70–99)
Potassium: 4.2 mEq/L (ref 3.5–5.1)
Sodium: 137 mEq/L (ref 135–145)

## 2012-04-30 LAB — CBC
Hemoglobin: 13 g/dL (ref 12.0–15.0)
MCV: 93.5 fL (ref 78.0–100.0)
Platelets: 254 10*3/uL (ref 150–400)
RBC: 4.14 MIL/uL (ref 3.87–5.11)
WBC: 8.3 10*3/uL (ref 4.0–10.5)

## 2012-04-30 MED ORDER — GABAPENTIN 300 MG PO CAPS
300.0000 mg | ORAL_CAPSULE | Freq: Every day | ORAL | Status: DC
  Administered 2012-04-30: 300 mg via ORAL
  Filled 2012-04-30 (×2): qty 1

## 2012-04-30 MED ORDER — MELOXICAM 15 MG PO TABS
15.0000 mg | ORAL_TABLET | Freq: Every morning | ORAL | Status: DC
Start: 2012-04-30 — End: 2012-05-01
  Administered 2012-04-30 – 2012-05-01 (×2): 15 mg via ORAL
  Filled 2012-04-30 (×2): qty 1

## 2012-04-30 MED ORDER — AMITRIPTYLINE HCL 25 MG PO TABS
25.0000 mg | ORAL_TABLET | Freq: Every day | ORAL | Status: DC
  Administered 2012-04-30: 25 mg via ORAL
  Filled 2012-04-30 (×2): qty 1

## 2012-04-30 MED ORDER — BISACODYL 10 MG RE SUPP
10.0000 mg | Freq: Once | RECTAL | Status: AC
  Administered 2012-04-30: 10 mg via RECTAL
  Filled 2012-04-30: qty 1

## 2012-04-30 NOTE — Progress Notes (Signed)
Subjective: Abdomen is soft, and few BS, some flatus.  Tolerating clears, NG clamped 4 hours and drain one, 50 ml recorded last time she went back on suction.  Objective: Vital signs in last 24 hours: Temp:  [97.5 F (36.4 C)-98.7 F (37.1 C)] 97.5 F (36.4 C) 16-May-2023 0626) Pulse Rate:  [54-59] 54  2023/05/16 0626) Resp:  [16-20] 16  May 16, 2023 0626) BP: (101-150)/(58-81) 101/65 mmHg 16-May-2023 0626) SpO2:  [95 %-97 %] 95 % May 16, 2023 0626) Last BM Date: 04/24/12  600 ml PO recorded Clear liquid diet, no stools recorded, afebrile, VSS, labs OK, film today shows no obstruction, contrast in decompressed colon  Intake/Output from previous day: 12/22 0701 May 16, 2023 0700 In: 3100 [P.O.:600; I.V.:2500] Out: 4000 [Urine:3650; Emesis/NG output:350] Intake/Output this shift:    General appearance: alert, cooperative and no distress GI: soft, non-tender; bowel sounds present; no masses,  no organomegaly and moves well.  Lab Results:   Basename 2012/05/15 0418 04/29/12 0828  WBC 8.3 9.3  HGB 13.0 13.9  HCT 38.7 41.4  PLT 254 284    BMET  Basename 05-15-2012 0418 04/29/12 0828  NA 137 138  K 4.2 4.3  CL 105 104  CO2 24 26  GLUCOSE 142* 102*  BUN 4* 4*  CREATININE 0.67 0.66  CALCIUM 8.5 8.8   PT/INR No results found for this basename: LABPROT:2,INR:2 in the last 72 hours   Lab 04/27/12 0051  AST 15  ALT 19  ALKPHOS 57  BILITOT 1.1  PROT 7.8  ALBUMIN 4.5     Lipase     Component Value Date/Time   LIPASE 47 04/27/2012 0051     Studies/Results: Dg Abd 2 Views  05-15-12  *RADIOLOGY REPORT*  Clinical Data: Partial small bowel obstruction, follow-up  ABDOMEN - 2 VIEW  Comparison: Abdomen films of 04/29/2012  Findings: Supine and erect views of the abdomen show no bowel obstruction.  No free air is seen.  An NG tube is noted with the tip in the distal antrum of the stomach.  Some contrast is noted in the decompressed colon and air is seen to the rectum.  A right total hip replacement  is noted and there is ray cage fusion at L5- S1.  IMPRESSION: No evidence of small bowel obstruction.  NG tube tip in distal antrum of the stomach.   Original Report Authenticated By: Dwyane Dee, M.D.    Dg Abd 2 Views  04/29/2012  *RADIOLOGY REPORT*  Clinical Data: Follow up small bowel obstruction  ABDOMEN - 2 VIEW  Comparison: CT scan 04/27/2012  Findings: NG tube in place is noted.  There is nonspecific nonobstructive bowel gas pattern.  Contrast material from recent CT scan noted in proximal colon.  No free abdominal air.  IMPRESSION:  NG tube in place.  Nonspecific nonobstructive bowel gas pattern. Contrast material from recent CT scan noted in proximal colon.   Original Report Authenticated By: Natasha Mead, M.D.     Medications:    . heparin  5,000 Units Subcutaneous Q8H  . hydrochlorothiazide  12.5 mg Oral Daily  . lip balm  1 application Topical BID  . lisinopril  20 mg Oral Daily    Assessment/Plan SBO Endometrioid adenocarcinoma; s/p TAH,BSO, lysis of adhesions, hernia repair.  11/02/11  Hypertension Body mass index is 37.5   Plan:  Pull NG, try some full liquids, and dulcolax supp. Restarting home meds too.  LOS: 3 days    JENNINGS,WILLARD May 15, 2012  Agree with above. NGT out, but still  has some left sided abdominal pain. Note:  I took a cyst off her abdomen a long time ago.  Ovidio Kin, MD, [facility] Pager: [phone] Office phone:  [phone]

## 2012-05-01 NOTE — Progress Notes (Signed)
Patient ID: [name], female   DOB: 1945/10/25, 66 y.o.   MRN: [id number]    Subjective: Pt feels well.  No nausea. Tolerating full liquids without difficulty.  Says she still hasn't passed flatus, but had a BM yesterday.  Objective: Vital signs in last 24 hours: Temp:  [97.8 F (36.6 C)-98.8 F (37.1 C)] 97.8 F (36.6 C) (12/24 0535) Pulse Rate:  [54-60] 60  (12/24 0535) Resp:  [18-20] 20  (12/24 0535) BP: (123-146)/(74-81) 129/81 mmHg (12/24 0535) SpO2:  [94 %-96 %] 94 % (12/24 0535) Last BM Date: 05-10-2012  Intake/Output from previous day: May 11, 2023 0701 - 12/24 0700 In: 3800 [P.O.:300; I.V.:3500] Out: 2250 [Urine:2250] Intake/Output this shift:    PE: Abd: soft, NT, ND, +BS  Lab Results:   [facility] 05/10/2012 0418 04/29/12 0828  WBC 8.3 9.3  HGB 13.0 13.9  HCT 38.7 41.4  PLT 254 284   BMET  Basename May 10, 2012 0418 04/29/12 0828  NA 137 138  K 4.2 4.3  CL 105 104  CO2 24 26  GLUCOSE 142* 102*  BUN 4* 4*  CREATININE 0.67 0.66  CALCIUM 8.5 8.8   PT/INR No results found for this basename: LABPROT:2,INR:2 in the last 72 hours CMP     Component Value Date/Time   NA 137 2012/05/10 0418   K 4.2 05/10/12 0418   CL 105 05/10/2012 0418   CO2 24 05/10/12 0418   GLUCOSE 142* 05-10-12 0418   BUN 4* 2012/05/10 0418   CREATININE 0.67 10-May-2012 0418   CALCIUM 8.5 2012-05-10 0418   PROT 7.8 04/27/2012 0051   ALBUMIN 4.5 04/27/2012 0051   AST 15 04/27/2012 0051   ALT 19 04/27/2012 0051   ALKPHOS 57 04/27/2012 0051   BILITOT 1.1 04/27/2012 0051   GFRNONAA 89* 05-10-12 0418   GFRAA >90 05-10-12 0418   Lipase     Component Value Date/Time   LIPASE 47 04/27/2012 0051       Studies/Results: Dg Abd 2 Views  May 10, 2012  *RADIOLOGY REPORT*  Clinical Data: Partial small bowel obstruction, follow-up  ABDOMEN - 2 VIEW  Comparison: Abdomen films of 04/29/2012  Findings: Supine and erect views of the abdomen show no bowel obstruction.  No free air is  seen.  An NG tube is noted with the tip in the distal antrum of the stomach.  Some contrast is noted in the decompressed colon and air is seen to the rectum.  A right total hip replacement is noted and there is ray cage fusion at L5- S1.  IMPRESSION: No evidence of small bowel obstruction.  NG tube tip in distal antrum of the stomach.   Original Report Authenticated By: Dwyane Dee, M.D.     Anti-infectives: Anti-infectives    None       Assessment/Plan  1. SBO, resolved  Plan: 1. Will advance diet today.  If tolerates then suspect can go home later this afternoon.   LOS: 4 days    OSBORNE,KELLY E 05/01/2012, 9:27 AM Pager: [phone]  Has done well and wants to go home. Discharge home today.  Ovidio Kin, MD, [facility] Pager: [phone] Office phone:  [phone]

## 2012-05-14 NOTE — Discharge Summary (Signed)
Patient ID: [name] MRN: [id number] DOB/AGE: 68-Mar-1947 68 y.o.  Admit date: 04/27/2012 Discharge date: [id number]  Procedures: none  Consults: None  Reason for Admission: Pleasant obese female with prior abdominal surgeries. Had endometrial cancer that required hysterectomy and bilateral salpingo-oophorectomy June 2012. No post adjuvant chemotherapy needed. Last seen in August with normal Pap smear and no new surprises.Struggles with intermittent constipation. This month, has had episodes of crampy abdominal pain and nausea vomiting. At least three times. Last event was most intense. Throwing up constantly. No flatus or bowel movements. Became more intense. Her daughter, whom I have operated on, was concerned and brought her into the emergency room.   No history of prior bowel obstructions or admissions for nausea or vomiting.No personal nor family history of GI/colon cancer, inflammatory bowel disease, irritable bowel syndrome, allergy such as Celiac Sprue, dietary/dairy problems, colitis, ulcers nor gastritis. No recent sick contacts/gastroenteritis. No travel outside the country. No changes in diet.Has had endoscopies with normal colonoscopies in the past by Dr. Jarold Motto with [facility] gastroenterology. Has a lot of chronic pain with numerous back surgeries. Controlled with neuropathic medicines at this point. The patient can walk about 10 minutes with a cane without difficulty.  Workup was concerning for bowel obstruction with transition zone in the pelvis. Surgical consultation was requested.  Admission Diagnoses:  1. SBO 2. HTN  Hospital Course: The patient was admitted and an NGT was placed for decompression.  Her tube was able to be clamped on HD# 2, as she was improving.  She tolerated this and her NGT was able to be removed the following day.  Her diet was then able to be advanced as tolerated.  On HD#4, she was stable for dc home.  Discharge Diagnoses:  Principal Problem:  *SBO (small bowel obstruction) Active Problems:  Incisional hernia  Hypertension  Obesity (BMI 30-39.9)  Chronic pain  Hypokalemia  Colon polyp, hyperplastic   Discharge Medications:   Medication List     As of 05/14/2012  1:58 PM    TAKE these medications         amitriptyline 25 MG tablet   Commonly known as: ELAVIL   Take 25 mg by mouth at bedtime.      gabapentin 300 MG capsule   Commonly known as: NEURONTIN   Take 300 mg by mouth at bedtime.      lisinopril-hydrochlorothiazide 20-12.5 MG per tablet   Commonly known as: PRINZIDE,ZESTORETIC   Take 1 tablet by mouth every morning.      meloxicam 15 MG tablet   Commonly known as: MOBIC   Take 15 mg by mouth every morning.      simvastatin 10 MG tablet   Commonly known as: ZOCOR   Take 10 mg by mouth at bedtime.        Discharge Instructions:     Follow-up Information    Follow up with Rudi Heap, MD. (As needed)    Contact information:   [address] [place] [address] [phone]          Signed: Letha Cape 05/14/2012, 1:58 PM  Agree with above.  Ovidio Kin, M.D. [facility] [phone]

## 2012-05-28 ENCOUNTER — Encounter (INDEPENDENT_AMBULATORY_CARE_PROVIDER_SITE_OTHER): Payer: Medicare Other | Admitting: Surgery

## 2012-06-06 ENCOUNTER — Encounter (INDEPENDENT_AMBULATORY_CARE_PROVIDER_SITE_OTHER): Payer: Medicare Other | Admitting: Surgery

## 2012-06-11 ENCOUNTER — Other Ambulatory Visit (HOSPITAL_COMMUNITY): Payer: Self-pay | Admitting: Urology

## 2012-06-11 DIAGNOSIS — N135 Crossing vessel and stricture of ureter without hydronephrosis: Secondary | ICD-10-CM

## 2012-06-13 ENCOUNTER — Ambulatory Visit: Payer: Medicare Other | Admitting: Gynecologic Oncology

## 2012-06-20 ENCOUNTER — Encounter (INDEPENDENT_AMBULATORY_CARE_PROVIDER_SITE_OTHER): Payer: Self-pay | Admitting: Surgery

## 2012-06-20 ENCOUNTER — Ambulatory Visit (INDEPENDENT_AMBULATORY_CARE_PROVIDER_SITE_OTHER): Payer: Medicare Other | Admitting: Surgery

## 2012-06-20 VITALS — BP 132/86 | HR 76 | Temp 98.2°F | Resp 18 | Ht 60.0 in | Wt 204.2 lb

## 2012-06-20 DIAGNOSIS — K56609 Unspecified intestinal obstruction, unspecified as to partial versus complete obstruction: Secondary | ICD-10-CM

## 2012-06-20 DIAGNOSIS — K682 Retroperitoneal fibrosis: Secondary | ICD-10-CM

## 2012-06-20 DIAGNOSIS — N135 Crossing vessel and stricture of ureter without hydronephrosis: Secondary | ICD-10-CM

## 2012-06-20 DIAGNOSIS — Z8542 Personal history of malignant neoplasm of other parts of uterus: Secondary | ICD-10-CM

## 2012-06-20 DIAGNOSIS — K432 Incisional hernia without obstruction or gangrene: Secondary | ICD-10-CM

## 2012-06-20 HISTORY — DX: Retroperitoneal fibrosis: K68.2

## 2012-06-20 HISTORY — DX: Crossing vessel and stricture of ureter without hydronephrosis: N13.5

## 2012-06-20 MED ORDER — METRONIDAZOLE 500 MG PO TABS: 500.0000 mg | ORAL_TABLET | ORAL | Status: DC

## 2012-06-20 MED ORDER — NEOMYCIN SULFATE 500 MG PO TABS: 1000.0000 mg | ORAL_TABLET | ORAL | Status: DC

## 2012-06-20 NOTE — Patient Instructions (Addendum)
See the Handout(s) we gave you.  Consider surgery to lyse adhesions/scar tissue causing recurrent bowel obstructions.  Also fix recurrent hernia near incision around bellybutton..  Please call our office at [phone] if you wish to schedule surgery or if you have further questions / concerns.   Small Bowel Obstruction A small bowel obstruction is a blockage (obstruction) of the small intestine (small bowel). The small bowel is a long, slender tube that connects the stomach to the colon. Its job is to absorb nutrients from the fluids and foods you consume into the bloodstream.  CAUSES  There are many causes of intestinal blockage. The most common ones include:  Hernias. This is a more common cause in children than adults.  Inflammatory bowel disease (enteritis and colitis).  Twisting of the bowel (volvulus).  Tumors.  Scar tissue (adhesions) from previous surgery or radiation treatment.  Recent surgery. This may cause an acute small bowel obstruction called an ileus. SYMPTOMS   Abdominal pain. This may be dull cramps or sharp pain. It may occur in one area or may be present in the entire abdomen. Pain can range from mild to severe, depending on the degree of obstruction.  Nausea and vomiting. Vomit may be greenish or yellow bile color.  Distended or swollen stomach. Abdominal bloating is a common symptom.  Constipation.  Lack of passing gas.  Frequent belching.  Diarrhea. This may occur if runny stool is able to leak around the obstruction. DIAGNOSIS  Your caregiver can usually diagnose small bowel obstruction by taking a history, doing a physical exam, and taking X-rays. If the cause is unclear, a CT scan (computerized tomography) of your abdomen and pelvis may be needed. TREATMENT  Treatment of the blockage depends on the cause and how bad the problem is.   Sometimes, the obstruction improves with bed rest and intravenous (IV) fluids.  Resting the bowel is very  important. This means following a simple diet. Sometimes, a clear liquid diet may be required for several days.  Sometimes, a small tube (nasogastric tube) is placed into the stomach to decompress the bowel. When the bowel is blocked, it usually swells up like a balloon filled with air and fluids. Decompression means that the air and fluids are removed by suction through that tube. This can help with pain, discomfort, and nausea. It can also help the obstruction resolve faster.  Surgery may be required if other treatments do not work. Bowel obstruction from a hernia may require early surgery and can be an emergency procedure. Adhesions that cause frequent or severe obstructions may also require surgery. HOME CARE INSTRUCTIONS If your bowel obstruction is only partial or incomplete, you may be allowed to go home.  Get plenty of rest.  Follow your diet as directed by your caregiver.  Only consume clear liquids until your condition improves.  Avoid solid foods as instructed. SEEK IMMEDIATE MEDICAL CARE IF:  You have increased pain or cramping.  You vomit blood.  You have uncontrolled vomiting or nausea.  You cannot drink fluids due to vomiting or pain.  You develop confusion.  You begin feeling very dry or thirsty (dehydrated).  You have severe bloating.  You have chills.  You have a fever.  You feel extremely weak or you faint. MAKE SURE YOU:  Understand these instructions.  Will watch your condition.  Will get help right away if you are not doing well or get worse. Document Released: 07/12/2005 Document Revised: 07/18/2011 Document Reviewed: 07/09/2010 [employer] Patient Information  [address], [place].  Hernia A hernia occurs when an internal organ pushes out through a weak spot in the abdominal wall. Hernias most commonly occur in the groin and around the navel. Hernias often can be pushed back into place (reduced). Most hernias tend to get worse over time. Some  abdominal hernias can get stuck in the opening (irreducible or incarcerated hernia) and cannot be reduced. An irreducible abdominal hernia which is tightly squeezed into the opening is at risk for impaired blood supply (strangulated hernia). A strangulated hernia is a medical emergency. Because of the risk for an irreducible or strangulated hernia, surgery may be recommended to repair a hernia. CAUSES   Heavy lifting.  Prolonged coughing.  Straining to have a bowel movement.  A cut (incision) made during an abdominal surgery. HOME CARE INSTRUCTIONS   Bed rest is not required. You may continue your normal activities.  Avoid lifting more than 10 pounds (4.5 kg) or straining.  Cough gently. If you are a smoker it is best to stop. Even the best hernia repair can break down with the continual strain of coughing. Even if you do not have your hernia repaired, a cough will continue to aggravate the problem.  Do not wear anything tight over your hernia. Do not try to keep it in with an outside bandage or truss. These can damage abdominal contents if they are trapped within the hernia sac.  Eat a normal diet.  Avoid constipation. Straining over long periods of time will increase hernia size and encourage breakdown of repairs. If you cannot do this with diet alone, stool softeners may be used. SEEK IMMEDIATE MEDICAL CARE IF:   You have a fever.  You develop increasing abdominal pain.  You feel nauseous or vomit.  Your hernia is stuck outside the abdomen, looks discolored, feels hard, or is tender.  You have any changes in your bowel habits or in the hernia that are unusual for you.  You have increased pain or swelling around the hernia.  You cannot push the hernia back in place by applying gentle pressure while lying down. MAKE SURE YOU:   Understand these instructions.  Will watch your condition.  Will get help right away if you are not doing well or get worse. Document Released:  04/25/2005 Document Revised: 07/18/2011 Document Reviewed: 12/13/2007 [facility] Patient Information 2013 [place], [place].  GETTING TO GOOD BOWEL HEALTH. Irregular bowel habits such as constipation and diarrhea can lead to many problems over time.  Having one soft bowel movement a day is the most important way to prevent further problems.  The anorectal canal is designed to handle stretching and feces to safely manage our ability to get rid of solid waste (feces, poop, stool) out of our body.  BUT, hard constipated stools can act like ripping concrete bricks and diarrhea can be a burning fire to this very sensitive area of our body, causing inflamed hemorrhoids, anal fissures, increasing risk is perirectal abscesses, abdominal pain/bloating, an making irritable bowel worse.     The goal: ONE SOFT BOWEL MOVEMENT A DAY!  To have soft, regular bowel movements:    Drink at least 8 tall glasses of water a day.     Take plenty of fiber.  Fiber is the undigested part of plant food that passes into the colon, acting s "natures broom" to encourage bowel motility and movement.  Fiber can absorb and hold large amounts of water. This results in a larger, bulkier stool, which is soft and  easier to pass. Work gradually over several weeks up to 6 servings a day of fiber (25g a day even more if needed) in the form of: o Vegetables -- Root (potatoes, carrots, turnips), leafy green (lettuce, salad greens, celery, spinach), or cooked high residue (cabbage, broccoli, etc) o Fruit -- Fresh (unpeeled skin & pulp), Dried (prunes, apricots, cherries, etc ),  or stewed ( applesauce)  o Whole grain breads, pasta, etc (whole wheat)  o Bran cereals    Bulking Agents -- This type of water-retaining fiber generally is easily obtained each day by one of the following:  o Psyllium bran -- The psyllium plant is remarkable because its ground seeds can retain so much water. This product is available as Metamucil, Konsyl, Effersyllium, Per  Diem Fiber, or the less expensive generic preparation in drug and health food stores. Although labeled a laxative, it really is not a laxative.  o Methylcellulose -- This is another fiber derived from wood which also retains water. It is available as Citrucel. o Polyethylene Glycol - and "artificial" fiber commonly called Miralax or Glycolax.  It is helpful for people with gassy or bloated feelings with regular fiber o Flax Seed - a less gassy fiber than psyllium   No reading or other relaxing activity while on the toilet. If bowel movements take longer than 5 minutes, you are too constipated   AVOID CONSTIPATION.  High fiber and water intake usually takes care of this.  Sometimes a laxative is needed to stimulate more frequent bowel movements, but    Laxatives are not a good long-term solution as it can wear the colon out. o Osmotics (Milk of Magnesia, Fleets phosphosoda, Magnesium citrate, MiraLax, GoLytely) are safer than  o Stimulants (Senokot, Castor Oil, Dulcolax, Ex Lax)    o Do not take laxatives for more than 7days in a row.    IF SEVERELY CONSTIPATED, try a Bowel Retraining Program: o Do not use laxatives.  o Eat a diet high in roughage, such as bran cereals and leafy vegetables.  o Drink six (6) ounces of prune or apricot juice each morning.  o Eat two (2) large servings of stewed fruit each day.  o Take one (1) heaping tablespoon of a psyllium-based bulking agent twice a day. Use sugar-free sweetener when possible to avoid excessive calories.  o Eat a normal breakfast.  o Set aside 15 minutes after breakfast to sit on the toilet, but do not strain to have a bowel movement.  o If you do not have a bowel movement by the third day, use an enema and repeat the above steps.    Controlling diarrhea o Switch to liquids and simpler foods for a few days to avoid stressing your intestines further. o Avoid dairy products (especially milk & ice cream) for a short time.  The intestines often can  lose the ability to digest lactose when stressed. o Avoid foods that cause gassiness or bloating.  Typical foods include beans and other legumes, cabbage, broccoli, and dairy foods.  Every person has some sensitivity to other foods, so listen to our body and avoid those foods that trigger problems for you. o Adding fiber (Citrucel, Metamucil, psyllium, Miralax) gradually can help thicken stools by absorbing excess fluid and retrain the intestines to act more normally.  Slowly increase the dose over a few weeks.  Too much fiber too soon can backfire and cause cramping & bloating. o Probiotics (such as active yogurt, Align, etc) may help repopulate the intestines  and colon with normal bacteria and calm down a sensitive digestive tract.  Most studies show it to be of mild help, though, and such products can be costly. o Medicines:   Bismuth subsalicylate (ex. Kayopectate, Pepto Bismol) every 30 minutes for up to 6 doses can help control diarrhea.  Avoid if pregnant.   Loperamide (Immodium) can slow down diarrhea.  Start with two tablets (4mg  total) first and then try one tablet every 6 hours.  Avoid if you are having fevers or severe pain.  If you are not better or start feeling worse, stop all medicines and call your doctor for advice o Call your doctor if you are getting worse or not better.  Sometimes further testing (cultures, endoscopy, X-ray studies, bloodwork, etc) may be needed to help diagnose and treat the cause of the diarrhea. o

## 2012-06-20 NOTE — Progress Notes (Signed)
Subjective:     Patient ID: [name], female   DOB: 10/30/1945, 67 y.o.   MRN: [id number]  HPI  [name]  05/03/1946 [id number]  Patient Care Team: Donald W Moore, MD as PCP - General (Family Medicine) Paola A. Gehrig, MD as Consulting Physician (Gynecologic Oncology) David R Patterson, MD as Consulting Physician (Gastroenterology) Les Borden, MD as Consulting Physician (Urology)  This patient is a 67 y.o.female who presents today for surgical evaluation at the request of self.   Reason for visit: Persistent episodes of nausea vomiting pain.  Recent admission for bowel obstruction.  Pleasant morbidly obese female.  Has had endometrial cancer excise with hysterectomy and salpingo-oophorectomy.  Also had chronic left pyelonephritis requiring a lap converted to open the ureteral lysis and patching of chronic ureteral abscess.  Eventually recovered.  A few months ago has had episodes of intermittent nausea vomiting and cramping.  Severe enough to be admitted to [facility] around Christmas.  CT scan suspicious for bowel structure with classic transition point in the left flank.  It improved when home.  Has had a few more episodes, one almost severe enough to come to the emergency room.  She was concerned and wished to be seen.  She struggles with chronic constipation.  Uses stool softeners as a laxative to have a bowel movement once or twice a week.  Has had hip surgery and arthritis but can walk 15 minutes without much difficulty using a cane for balance.  No evidence of recurrence of endometrial cancer followed by Dr. Gerhig with [facility].  No history of infections.  Patient Active Problem List  Diagnosis  . History of endometrial cancer s/p TAH BSO June2013  . Incisional hernia  . SBO (small bowel obstruction)  . Hypertension  . Obesity (BMI 30-39.9)  . Chronic pain  . Hypokalemia  . Colon polyp, hyperplastic    Past Medical History  Diagnosis Date  . Endometrioid  adenocarcinoma   . Hypertension   . Obesity (BMI 30-39.9) 04/27/2012  . Arthritis   . Hyperlipidemia   . Ureteral adhesion, left 2010    s/p ureterolysis & stenting Dr. Borden    Past Surgical History  Procedure Laterality Date  . Abdominal hysterectomy  11/02/10    TAH, BSO, lysis of adhesions for endometrial cancer  . Incisional hernia repair  11/02/10  . Back surgery  1989, 1999, 2009    anterior L2-3, L3-4 arthrodesis  . Kidney surgery  2010    Left kidney  . Bladder suspension  2003  . Foot surgery  06/2010    left foot surgery  . Joint replacement  09/2009    Right total knee  . Total hip arthroplasty  01/2010    Right total hip  . Cataract extraction, bilateral  2003  . Total knee arthroplasty      right  . Ureterolysis  2010    lap/open left ureterolysis & omental flap, ureter stent    History   Social History  . Marital Status: Widowed    Spouse Name: N/A    Number of Children: N/A  . Years of Education: N/A   Occupational History  . Not on file.   Social History Main Topics  . Smoking status: Never Smoker   . Smokeless tobacco: Not on file  . Alcohol Use: No  . Drug Use: No  . Sexually Active: No   Other Topics Concern  . Not on file   Social History Narrative  . No   narrative on file    Family History  Problem Relation Age of Onset  . Diabetes Mother   . Cancer Mother     lung  . Diabetes Father   . Cancer Father     liver  . Colon cancer Sister   . Diabetes Sister   . Cancer Sister     colon    Current Outpatient Prescriptions  Medication Sig Dispense Refill  . amitriptyline (ELAVIL) 25 MG tablet Take 25 mg by mouth at bedtime.      . gabapentin (NEURONTIN) 300 MG capsule Take 300 mg by mouth at bedtime.      . lisinopril-hydrochlorothiazide (PRINZIDE,ZESTORETIC) 20-12.5 MG per tablet Take 1 tablet by mouth every morning.       . meloxicam (MOBIC) 15 MG tablet Take 15 mg by mouth every morning.       . simvastatin (ZOCOR) 10 MG tablet  Take 10 mg by mouth at bedtime.       No current facility-administered medications for this visit.     Allergies  Allergen Reactions  . Cashew Nut Oil Hives  . Zofran Hives and Nausea Only    BP 132/86  Pulse 76  Temp(Src) 98.2 F (36.8 C) (Temporal)  Resp 18  Ht 5' (1.524 m)  Wt 204 lb 3.2 oz (92.625 kg)  BMI 39.88 kg/m2  No results found.   Review of Systems  Constitutional: Negative for fever, chills, diaphoresis, appetite change and fatigue.  HENT: Negative for ear pain, sore throat, trouble swallowing, neck pain and ear discharge.   Eyes: Negative for photophobia, discharge and visual disturbance.  Respiratory: Negative for cough, choking, chest tightness and shortness of breath.   Cardiovascular: Negative for chest pain and palpitations.  Gastrointestinal: Positive for nausea, vomiting, abdominal pain, constipation and abdominal distention. Negative for diarrhea, blood in stool, anal bleeding and rectal pain.  Genitourinary: Negative for dysuria, urgency, frequency, decreased urine volume, difficulty urinating and pelvic pain.  Musculoskeletal: Negative for myalgias and gait problem.  Skin: Negative for color change, pallor and rash.  Neurological: Negative for dizziness, speech difficulty, weakness and numbness.  Hematological: Negative for adenopathy.  Psychiatric/Behavioral: Negative for confusion and agitation. The patient is not nervous/anxious.        Objective:   Physical Exam  Constitutional: She is oriented to person, place, and time. She appears well-developed and well-nourished. No distress.  HENT:  Head: Normocephalic.  Mouth/Throat: Oropharynx is clear and moist. No oropharyngeal exudate.  Eyes: Conjunctivae and EOM are normal. Pupils are equal, round, and reactive to light. No scleral icterus.  Neck: Normal range of motion. Neck supple. No tracheal deviation present.  Cardiovascular: Normal rate, regular rhythm and intact distal pulses.     Pulmonary/Chest: Effort normal and breath sounds normal. No respiratory distress. She exhibits no tenderness.  Abdominal: Soft. Bowel sounds are normal. She exhibits no distension and no mass. There is no tenderness. There is no rigidity, no guarding, no CVA tenderness, no tenderness at McBurney's point and negative Murphy's sign. A hernia is present. Hernia confirmed positive in the ventral area. Hernia confirmed negative in the right inguinal area and confirmed negative in the left inguinal area.    Genitourinary: No vaginal discharge found.  Musculoskeletal: Normal range of motion. She exhibits no tenderness.  Lymphadenopathy:    She has no cervical adenopathy.       Right: No inguinal adenopathy present.       Left: No inguinal adenopathy present.  Neurological: She is alert   and oriented to person, place, and time. No cranial nerve deficit. She exhibits normal muscle tone. Coordination normal.  Skin: Skin is warm and dry. No rash noted. She is not diaphoretic. No erythema.  Psychiatric: She has a normal mood and affect. Her speech is normal and behavior is normal. Judgment and thought content normal. Cognition and memory are normal.  Very pleasant and affable.  Laughs a lot       Assessment:     Recurrent episodes of nausea vomiting and abdominal pain with documented bowel obstruction the past.  Concerning for persistent adhesion disease.  History of endometrial cancer status post hysterectomy with no evidence of recurrence on CT scan.  History of left ureteral abscess/pyelonephritis in status post repair.  No evidence of abscess.  Ventral wall incisional hernia periumbilical.  Not massive.  No evidence of hernias elsewhere.     Plan:     I think that she would benefit from surgery to address her persistent area of obstruction.  It started out laparoscopically.  Possible conversion to open.  There was concern of retroperitoneal fibrosis but I have a feeling that was related to her  chronic infection and is not as much of an issue.  While and there, I would repair her incisional hernias periumbilical he.  She her daughter are very interested in proceeding with surgery since she starting had three or four episodes in only a few months, one requiring admission.  The anatomy & physiology of the digestive tract was discussed.  The pathophysiology of intestinal obstruction was discussed.  Natural history risks without surgery was discussed.   I feel the patient has failed non-operative therapies.  The risks of no intervention will lead to serious problems such as necrosis, perforation, dehydration, etc. that outweigh the operative risks; therefore, I recommended abdominal exploration to diagnose & treat the source of the problem.  Laparoscopic & open techniques were discussed.   I expressed a good likelihood that surgery will treat the problem.  The anatomy & physiology of the abdominal wall was discussed.  The pathophysiology of hernias was discussed.  Natural history risks without surgery including progeressive enlargement, pain, incarceration & strangulation was discussed.   Contributors to complications such as smoking, obesity, diabetes, prior surgery, etc were discussed.   I feel the risks of no intervention will lead to serious problems that outweigh the operative risks; therefore, I recommended surgery to reduce and repair the hernia.  I explained laparoscopic techniques with possible need for an open approach.  I noted the probable use of mesh to patch and/or buttress the hernia repair.  Risks such as bleeding, infection, abscess, need for further treatment, heart attack, death, and other risks were discussed.  I noted a good likelihood this will help address the problem.   Goals of post-operative recovery were discussed as well.  Possibility that this will not correct all symptoms was explained.  I stressed the importance of low-impact activity, aggressive pain control, avoiding  constipation, & not pushing through pain to minimize risk of post-operative chronic pain or injury. Possibility of reherniation especially with smoking, obesity, diabetes, immunosuppression, and other health conditions was discussed.  We will work to minimize complications.     An educational handout further explaining the pathology & treatment options was given as well.  Questions were answered.  The patient expresses understanding & wishes to proceed with surgery.        

## 2012-06-21 ENCOUNTER — Ambulatory Visit: Payer: Medicare Other | Admitting: Gynecologic Oncology

## 2012-06-27 ENCOUNTER — Encounter: Payer: Self-pay | Admitting: Gynecologic Oncology

## 2012-06-27 ENCOUNTER — Ambulatory Visit: Payer: Medicare Other | Attending: Gynecologic Oncology | Admitting: Gynecologic Oncology

## 2012-06-27 VITALS — BP 142/70 | HR 80 | Temp 98.6°F | Resp 20 | Ht 60.0 in | Wt 198.5 lb

## 2012-06-27 DIAGNOSIS — E785 Hyperlipidemia, unspecified: Secondary | ICD-10-CM | POA: Insufficient documentation

## 2012-06-27 DIAGNOSIS — K1370 Unspecified lesions of oral mucosa: Secondary | ICD-10-CM | POA: Insufficient documentation

## 2012-06-27 DIAGNOSIS — C541 Malignant neoplasm of endometrium: Secondary | ICD-10-CM

## 2012-06-27 DIAGNOSIS — I10 Essential (primary) hypertension: Secondary | ICD-10-CM | POA: Insufficient documentation

## 2012-06-27 DIAGNOSIS — K1379 Other lesions of oral mucosa: Secondary | ICD-10-CM

## 2012-06-27 DIAGNOSIS — Z79899 Other long term (current) drug therapy: Secondary | ICD-10-CM | POA: Insufficient documentation

## 2012-06-27 DIAGNOSIS — C549 Malignant neoplasm of corpus uteri, unspecified: Secondary | ICD-10-CM | POA: Insufficient documentation

## 2012-06-27 DIAGNOSIS — Z90710 Acquired absence of both cervix and uterus: Secondary | ICD-10-CM | POA: Insufficient documentation

## 2012-06-27 MED ORDER — MAGIC MOUTHWASH: 15.0000 mL | Freq: Three times a day (TID) | ORAL | Status: DC

## 2012-06-27 NOTE — Progress Notes (Signed)
Follow Up Note: Gyn-Onc  [name] 67 y.o. female  CC:  Chief Complaint  Patient presents with  . Endometrial cancer    Follow up    HPI:  [name] is a very pleasant 67 year old who initially presented with postmenopausal bleeding. She was seen by Dr. Ralph Dowdy and an endometrial biopsy was performed that revealed a grade 1 endometrioid adenocarcinoma. She was referred to GYN Oncology.  After discussion of risks and benefits, she opted for surgery and on November 02, 2010 underwent diagnostic laparoscopy conversion to TAH-BSO, lysis of adhesions for 20 minutes, and incisional hernia repair.  OPERATIVE FINDINGS: Included significant adhesive disease of the omentum and anterior abdominal wall, small bowel to the anterior abdominal wall, and a globular fibroid uterus. She had a 3-cm incisional hernia. Frozen section revealed minimal myometrial invasion. Fortunately, final pathology also revealed minimal myometrial invasion. She had a grade 1 endometrioid adenocarcinoma with focal superficial myometrial invasion of 0.1 cm with a myometrium of 1.8 cm or approximately 7%. She had a 5-cm tumor. No lymphovascular space Involvement.   Interval History:  She presents for continued follow up.  She reports that she has "been through a lot in the past several months."  She reports that she was recently admitted to the hospital for 4 days in December 2013 for abdominal pain, nausea, and persistent emesis.  It was determined that she had a SBO and she had an NG tube for 3 days.  She reports one episode of nausea and vomiting two weeks after discharge that she did not seek medical attention for and that resolved after one day.  She reports that she is scheduled to have an exploratory laparotomy with possible hernia repair by Dr. Gordy Savers on July 19, 2012.  She further reports that her "left hip went out."  In the past, it was  recommended that she have bilateral hip replacements but she ended up having the right hip replaced since "it was worse."  She denies falling and is able to bear weight on the left hip.  She uses a walking stick for support and she states that she is planning to see Dr. Leslee Home in the future for possible replacement of the left hip.  She thinks that washing dishes has exacerbated her left hip symptoms.  She reports intermittent mouth soreness on the right side of her tongue and the roof of her mouth.  She states that the soreness begins intermittently and lasts two days.  She has had two episodes of this and states that her uvula looks bright red during these times.  She uses oragel to the sore on the right tongue and mouthwash during the episodes of soreness.  She continues her weight loss efforts with her weight down to 198.5 lbs today from 211.4 lbs in August of 2013.  When asked about her family, she reports her son in law has leukemia and is currently at [facility].    Review of Systems 10 point review of systems is negative. She denies any abdominal or pelvic pain, vaginal bleeding, change in her bowel or bladder habits.  She denies any chest pain, shortness of breath, nausea, vomiting, fevers, chills, headaches, or visual changes.  Current Meds:  Outpatient Encounter Prescriptions as of 06/27/2012  Medication Sig Dispense Refill  . amitriptyline (ELAVIL) 25 MG tablet Take 25 mg by mouth at bedtime.      . gabapentin (NEURONTIN) 300 MG capsule Take 300 mg by mouth at bedtime.      Marland Kitchen  lisinopril-hydrochlorothiazide (PRINZIDE,ZESTORETIC) 20-12.5 MG per tablet Take 1 tablet by mouth every morning.       . meloxicam (MOBIC) 15 MG tablet Take 15 mg by mouth every morning.       . metroNIDAZOLE (FLAGYL) 500 MG tablet Take 1 tablet (500 mg total) by mouth as directed. Take 2 pills (=1000mg ) by mouth at 1pm, 3pm, and 10pm the day before your colorectal operation  6 tablet  0  . neomycin  (MYCIFRADIN) 500 MG tablet Take 2 tablets (1,000 mg total) by mouth as directed. Take 2 pills (=1000mg ) by mouth at 1pm, 3pm, and 10pm the day before your colorectal operation  6 tablet  0  . simvastatin (ZOCOR) 10 MG tablet Take 10 mg by mouth at bedtime.       No facility-administered encounter medications on file as of 06/27/2012.    Allergy:  Allergies  Allergen Reactions  . Cashew Nut Oil Hives  . Zofran Hives and Nausea Only    Social Hx:   History   Social History  . Marital Status: Widowed    Spouse Name: N/A    Number of Children: N/A  . Years of Education: N/A   Occupational History  . Not on file.   Social History Main Topics  . Smoking status: Never Smoker   . Smokeless tobacco: Not on file  . Alcohol Use: No  . Drug Use: No  . Sexually Active: No   Other Topics Concern  . Not on file   Social History Narrative  . No narrative on file    Past Surgical Hx:  Past Surgical History  Procedure Laterality Date  . Abdominal hysterectomy  11/02/10    TAH, BSO, lysis of adhesions for endometrial cancer  . Incisional hernia repair  11/02/10  . Back surgery  1989, 1999, 2009    anterior L2-3, L3-4 arthrodesis  . Kidney surgery  2010    Left kidney  . Bladder suspension  2003  . Foot surgery  06/2010    left foot surgery  . Joint replacement  09/2009    Right total knee  . Total hip arthroplasty  01/2010    Right total hip  . Cataract extraction, bilateral  2003  . Total knee arthroplasty      right  . Ureterolysis  2010    lap/open left ureterolysis & omental flap, ureter stent    Past Medical Hx:  Past Medical History  Diagnosis Date  . Endometrioid adenocarcinoma   . Hypertension   . Obesity (BMI 30-39.9) 04/27/2012  . Arthritis   . Hyperlipidemia   . Ureteral adhesion, left 2010    s/p ureterolysis & stenting Dr. Laverle Patter    Family Hx:  Family History  Problem Relation Age of Onset  . Diabetes Mother   . Cancer Mother     lung  . Diabetes  Father   . Cancer Father     liver  . Colon cancer Sister   . Diabetes Sister   . Cancer Sister     colon    Vitals:  Blood pressure 142/70, pulse 80, temperature 98.6 F (37 C), resp. rate 20, height 5' (1.524 m), weight 198 lb 8 oz (90.039 kg).  Physical Exam:  General: Well developed, well nourished female in no acute distress. Alert and oriented x 3.  Using walking stick for support.  Head/ Neck: Oropharynx with light pink sore noted to the right lateral tongue.  Sclerae anicteric.  Supple without any enlargements.  Lymph node survey: No cervical, supraclavicular, or inguinal adenopathy.  Cardiovascular: Regular rate and rhythm. S1 and S2 normal.  Lungs: Clear to auscultation bilaterally. No wheezes/crackles/rhonchi noted.  Skin: No rashes or lesions present. Back: No CVA tenderness.  Abdomen: Abdomen soft, non-tender and obese.  Active bowel sounds in all quadrants. No evidence of a fluid wave or abdominal masses.  No distension, tenderness, rigidity, or guarding.  Ventral midline incision well healed with herniation present.  Genitourinary:    Vulva/vagina: Normal external female genitalia. No lesions.    Urethra: No lesions or masses    Vagina: Atrophic without any lesions. No palpable masses. No vaginal bleeding or drainage noted.  Rectal: Good tone, no masses no cul de sac nodularity.  Extremities: No bilateral cyanosis or clubbing.  Mild, non-pitting edema noted with the left ankle.   Assessment/Plan:  [name] is a 67 year old with stage IA, grade 1 endometrioid adenocarcinoma, who requires no postoperative adjuvant therapy.  She has been without evidence of recurrence since 2012.  She is to follow up with Dr. Duard Brady in 6 months with a pap at that time.  The upcoming surgery with Dr. Michaell Cowing discussed with the patient and Dr. Duard Brady including surgical intervention vs. expectant management.  She verbalized understanding of her options.  She is instructed to call the office  for any questions or concerns.   CROSS, MELISSA DEAL, NP 06/27/2012, 10:34 AM

## 2012-06-27 NOTE — Patient Instructions (Addendum)
Use the magic mouthwash three times daily as needed for mouth soreness.  Plan to follow up in 6 months with Dr. Duard Brady with a pap smear at that time.    Thank you for coming to see me today.  I appreciate your confidence in choosing [facility] for your medical care.  If you have any questions about your visit today, please call our office and we will get back to you as soon as possible.  Warner Mccreedy, NP Gynecologic Oncology

## 2012-07-06 ENCOUNTER — Encounter (HOSPITAL_COMMUNITY): Payer: Self-pay | Admitting: Pharmacy Technician

## 2012-07-10 NOTE — Patient Instructions (Addendum)
20 [name]  07/10/2012   Your procedure is scheduled on:  3-13 -2014  Report to [facility] at     0930   AM.  Call this number if you have problems the morning of surgery: [phone]  Or Presurgical Testing [phone]([name])   Remember: Follow any bowel prep instructions per MD office.    Do not eat food:After Midnight.  Drink clear 2 glasses Clear liquid at 0600 AM- then nothing after.  Take these medicines the morning of surgery with A SIP OF WATER: none- do not take lisinopril Am of .   Do not wear jewelry, make-up or nail polish.  Do not wear lotions, powders, or perfumes. You may wear deodorant.  Do not shave 12 hours prior to first CHG shower(legs and under arms).(face and neck okay.)  Do not bring valuables to the hospital.  Contacts, dentures or bridgework,body piercing,  may not be worn into surgery.  Leave suitcase in the car. After surgery it may be brought to your room.  For patients admitted to the hospital, checkout time is 11:00 AM the day of discharge.   Patients discharged the day of surgery will not be allowed to drive home. Must have responsible person with you x 24 hours once discharged.  Name and phone number of your driver: [name] [phone]  Special Instructions: CHG(Chlorhedine 4%-"Hibiclens","Betasept","Aplicare") Shower Use Special Wash: see special instructions.(avoid face and genitals)   Please read over the following fact sheets that you were given: MRSA Information.    Failure to follow these instructions may result in Cancellation of your surgery.   Patient signature_______________________________________________________

## 2012-07-11 ENCOUNTER — Encounter (HOSPITAL_COMMUNITY)
Admission: RE | Admit: 2012-07-11 | Discharge: 2012-07-11 | Disposition: A | Discharge status: Home / Self Care | Payer: Medicare Other | Source: Ambulatory Visit | Attending: Surgery | Admitting: Surgery

## 2012-07-11 ENCOUNTER — Encounter (HOSPITAL_COMMUNITY): Payer: Self-pay

## 2012-07-11 ENCOUNTER — Other Ambulatory Visit: Payer: Self-pay

## 2012-07-11 LAB — CBC
HCT: 42.7 % (ref 36.0–46.0)
Hemoglobin: 14.5 g/dL (ref 12.0–15.0)
MCV: 93.2 fL (ref 78.0–100.0)
WBC: 9.8 10*3/uL (ref 4.0–10.5)

## 2012-07-11 LAB — BASIC METABOLIC PANEL
CO2: 26 mEq/L (ref 19–32)
Chloride: 98 mEq/L (ref 96–112)
GFR calc Af Amer: >90 mL/min (ref >90)
Potassium: 3.7 mEq/L (ref 3.5–5.1)

## 2012-07-11 LAB — SURGICAL PCR SCREEN: Staphylococcus aureus: POSITIVE — AB

## 2012-07-11 NOTE — Pre-Procedure Instructions (Addendum)
07-11-12 1 view CXR 12'13 -Epic. EKG done today. 07-12-12 0825 Patient notified of Positive PCR screen -Staph aureus- will use Mupirocin as directed. W. Kennon Portela

## 2012-07-16 ENCOUNTER — Encounter (HOSPITAL_COMMUNITY): Payer: Self-pay

## 2012-07-16 ENCOUNTER — Encounter (HOSPITAL_COMMUNITY)
Admission: RE | Admit: 2012-07-16 | Discharge: 2012-07-16 | Disposition: A | Discharge status: Home / Self Care | Payer: Medicare Other | Source: Ambulatory Visit | Attending: Urology | Admitting: Urology

## 2012-07-16 DIAGNOSIS — N135 Crossing vessel and stricture of ureter without hydronephrosis: Secondary | ICD-10-CM | POA: Insufficient documentation

## 2012-07-16 MED ORDER — FUROSEMIDE 10 MG/ML IJ SOLN
40.0000 mg | Freq: Once | INTRAMUSCULAR | Status: AC
  Administered 2012-07-16: 40 mg via INTRAVENOUS
  Filled 2012-07-16: qty 4

## 2012-07-16 MED ORDER — TECHNETIUM TC 99M MERTIATIDE
15.0000 | Freq: Once | INTRAVENOUS | Status: AC | PRN
  Administered 2012-07-16: 15 via INTRAVENOUS

## 2012-07-19 ENCOUNTER — Inpatient Hospital Stay (HOSPITAL_COMMUNITY)
Admission: RE | Admit: 2012-07-19 | Discharge: 2012-07-23 | DRG: 331 | Disposition: A | Discharge status: Home / Self Care | Payer: Medicare Other | Source: Ambulatory Visit | Attending: Surgery | Admitting: Surgery

## 2012-07-19 ENCOUNTER — Ambulatory Visit (HOSPITAL_COMMUNITY): Payer: Medicare Other | Admitting: Certified Registered Nurse Anesthetist

## 2012-07-19 ENCOUNTER — Encounter (HOSPITAL_COMMUNITY): Payer: Self-pay | Admitting: Certified Registered Nurse Anesthetist

## 2012-07-19 ENCOUNTER — Encounter (HOSPITAL_COMMUNITY)
Admission: RE | Disposition: A | Discharge status: Home / Self Care | Payer: Self-pay | Source: Ambulatory Visit | Attending: Surgery

## 2012-07-19 ENCOUNTER — Encounter (HOSPITAL_COMMUNITY): Payer: Self-pay | Admitting: *Deleted

## 2012-07-19 DIAGNOSIS — K56609 Unspecified intestinal obstruction, unspecified as to partial versus complete obstruction: Secondary | ICD-10-CM | POA: Diagnosis present

## 2012-07-19 DIAGNOSIS — K660 Peritoneal adhesions (postprocedural) (postinfection): Secondary | ICD-10-CM | POA: Diagnosis present

## 2012-07-19 DIAGNOSIS — I10 Essential (primary) hypertension: Secondary | ICD-10-CM | POA: Diagnosis present

## 2012-07-19 DIAGNOSIS — R112 Nausea with vomiting, unspecified: Secondary | ICD-10-CM | POA: Diagnosis present

## 2012-07-19 DIAGNOSIS — K439 Ventral hernia without obstruction or gangrene: Secondary | ICD-10-CM

## 2012-07-19 DIAGNOSIS — K430 Incisional hernia with obstruction, without gangrene: Principal | ICD-10-CM | POA: Diagnosis present

## 2012-07-19 DIAGNOSIS — K5900 Constipation, unspecified: Secondary | ICD-10-CM | POA: Diagnosis present

## 2012-07-19 DIAGNOSIS — G8929 Other chronic pain: Secondary | ICD-10-CM | POA: Diagnosis present

## 2012-07-19 DIAGNOSIS — Z8542 Personal history of malignant neoplasm of other parts of uterus: Secondary | ICD-10-CM

## 2012-07-19 DIAGNOSIS — Z5331 Laparoscopic surgical procedure converted to open procedure: Secondary | ICD-10-CM

## 2012-07-19 DIAGNOSIS — K6389 Other specified diseases of intestine: Secondary | ICD-10-CM

## 2012-07-19 DIAGNOSIS — E669 Obesity, unspecified: Secondary | ICD-10-CM | POA: Diagnosis present

## 2012-07-19 DIAGNOSIS — K432 Incisional hernia without obstruction or gangrene: Secondary | ICD-10-CM | POA: Diagnosis present

## 2012-07-19 HISTORY — PX: VENTRAL HERNIA REPAIR: SHX424

## 2012-07-19 HISTORY — DX: Personal history of malignant neoplasm of other parts of uterus: Z85.42

## 2012-07-19 HISTORY — PX: HERNIA REPAIR: SHX51

## 2012-07-19 HISTORY — DX: Crossing vessel and stricture of ureter without hydronephrosis: N13.5

## 2012-07-19 LAB — CREATININE, SERUM
Creatinine, Ser: 0.64 mg/dL (ref 0.50–1.10)
GFR calc Af Amer: >90 mL/min (ref >90)
GFR calc non Af Amer: >90 mL/min (ref >90)

## 2012-07-19 LAB — CBC
Hemoglobin: 13.2 g/dL (ref 12.0–15.0)
MCH: 32.2 pg (ref 26.0–34.0)
RBC: 4.1 MIL/uL (ref 3.87–5.11)
WBC: 16.8 10*3/uL — ABNORMAL HIGH (ref 4.0–10.5)

## 2012-07-19 SURGERY — REPAIR, HERNIA, VENTRAL, LAPAROSCOPIC
Anesthesia: General | Site: Abdomen | Wound class: Contaminated

## 2012-07-19 MED ORDER — HYDROMORPHONE HCL PF 1 MG/ML IJ SOLN
INTRAMUSCULAR | Status: AC
  Filled 2012-07-19: qty 1

## 2012-07-19 MED ORDER — BUPIVACAINE-EPINEPHRINE 0.25% -1:200000 IJ SOLN
INTRAMUSCULAR | Status: DC | PRN
  Administered 2012-07-19: 90 mL

## 2012-07-19 MED ORDER — ACETAMINOPHEN 10 MG/ML IV SOLN
INTRAVENOUS | Status: DC | PRN
  Administered 2012-07-19: 1000 mg via INTRAVENOUS

## 2012-07-19 MED ORDER — SIMVASTATIN 10 MG PO TABS
10.0000 mg | ORAL_TABLET | Freq: Every day | ORAL | Status: DC
  Administered 2012-07-19 – 2012-07-22 (×4): 10 mg via ORAL
  Filled 2012-07-19 (×5): qty 1

## 2012-07-19 MED ORDER — METOPROLOL TARTRATE 12.5 MG HALF TABLET
12.5000 mg | ORAL_TABLET | Freq: Two times a day (BID) | ORAL | Status: DC | PRN
  Filled 2012-07-19: qty 1

## 2012-07-19 MED ORDER — ALUM & MAG HYDROXIDE-SIMETH 200-200-20 MG/5ML PO SUSP: 30.0000 mL | Freq: Four times a day (QID) | ORAL | Status: DC | PRN

## 2012-07-19 MED ORDER — SODIUM CHLORIDE 0.9 % IV SOLN: 250.0000 mL | INTRAVENOUS | Status: DC | PRN

## 2012-07-19 MED ORDER — SODIUM CHLORIDE 0.9 % IJ SOLN
3.0000 mL | Freq: Two times a day (BID) | INTRAMUSCULAR | Status: DC
  Administered 2012-07-20 – 2012-07-22 (×3): 3 mL via INTRAVENOUS

## 2012-07-19 MED ORDER — ROCURONIUM BROMIDE 100 MG/10ML IV SOLN
INTRAVENOUS | Status: DC | PRN
  Administered 2012-07-19: 10 mg via INTRAVENOUS
  Administered 2012-07-19: 45 mg via INTRAVENOUS
  Administered 2012-07-19: 5 mg via INTRAVENOUS
  Administered 2012-07-19: 10 mg via INTRAVENOUS

## 2012-07-19 MED ORDER — CHLORHEXIDINE GLUCONATE 4 % EX LIQD: 1.0000 "application " | Freq: Once | CUTANEOUS | Status: DC

## 2012-07-19 MED ORDER — METRONIDAZOLE IN NACL 5-0.79 MG/ML-% IV SOLN
500.0000 mg | Freq: Once | INTRAVENOUS | Status: AC
  Administered 2012-07-19: 500 mg via INTRAVENOUS

## 2012-07-19 MED ORDER — CEFAZOLIN SODIUM-DEXTROSE 2-3 GM-% IV SOLR
INTRAVENOUS | Status: AC
  Filled 2012-07-19: qty 50

## 2012-07-19 MED ORDER — EPHEDRINE SULFATE 50 MG/ML IJ SOLN
INTRAMUSCULAR | Status: DC | PRN
  Administered 2012-07-19: 10 mg via INTRAVENOUS
  Administered 2012-07-19: 5 mg via INTRAVENOUS

## 2012-07-19 MED ORDER — NEOSTIGMINE METHYLSULFATE 1 MG/ML IJ SOLN
INTRAMUSCULAR | Status: DC | PRN
  Administered 2012-07-19: 5 mg via INTRAVENOUS

## 2012-07-19 MED ORDER — HEPARIN SODIUM (PORCINE) 5000 UNIT/ML IJ SOLN
5000.0000 [IU] | Freq: Three times a day (TID) | INTRAMUSCULAR | Status: DC
  Administered 2012-07-20 – 2012-07-23 (×10): 5000 [IU] via SUBCUTANEOUS
  Filled 2012-07-19 (×13): qty 1

## 2012-07-19 MED ORDER — LACTATED RINGERS IV BOLUS (SEPSIS): 1000.0000 mL | Freq: Three times a day (TID) | INTRAVENOUS | Status: AC | PRN

## 2012-07-19 MED ORDER — PROMETHAZINE HCL 25 MG/ML IJ SOLN
INTRAMUSCULAR | Status: AC
  Filled 2012-07-19: qty 1

## 2012-07-19 MED ORDER — PSYLLIUM 95 % PO PACK
1.0000 | PACK | Freq: Two times a day (BID) | ORAL | Status: DC
  Administered 2012-07-20 – 2012-07-22 (×6): 1 via ORAL
  Filled 2012-07-19 (×9): qty 1

## 2012-07-19 MED ORDER — MAGIC MOUTHWASH
15.0000 mL | Freq: Four times a day (QID) | ORAL | Status: DC | PRN
  Filled 2012-07-19: qty 15

## 2012-07-19 MED ORDER — MELOXICAM 15 MG PO TABS
15.0000 mg | ORAL_TABLET | Freq: Every morning | ORAL | Status: DC
  Administered 2012-07-20 – 2012-07-22 (×3): 15 mg via ORAL
  Filled 2012-07-19 (×4): qty 1

## 2012-07-19 MED ORDER — FENTANYL CITRATE 0.05 MG/ML IJ SOLN
INTRAMUSCULAR | Status: DC | PRN
  Administered 2012-07-19 (×3): 50 ug via INTRAVENOUS
  Administered 2012-07-19: 100 ug via INTRAVENOUS

## 2012-07-19 MED ORDER — SACCHAROMYCES BOULARDII 250 MG PO CAPS
250.0000 mg | ORAL_CAPSULE | Freq: Two times a day (BID) | ORAL | Status: DC
  Administered 2012-07-19 – 2012-07-22 (×7): 250 mg via ORAL
  Filled 2012-07-19 (×9): qty 1

## 2012-07-19 MED ORDER — HYDROMORPHONE HCL PF 1 MG/ML IJ SOLN
0.5000 mg | INTRAMUSCULAR | Status: DC | PRN
  Administered 2012-07-19 – 2012-07-22 (×11): 1 mg via INTRAVENOUS
  Filled 2012-07-19 (×11): qty 1

## 2012-07-19 MED ORDER — LIDOCAINE HCL (CARDIAC) 20 MG/ML IV SOLN
INTRAVENOUS | Status: DC | PRN
  Administered 2012-07-19: 50 mg via INTRAVENOUS

## 2012-07-19 MED ORDER — LACTATED RINGERS IV SOLN: INTRAVENOUS | Status: DC

## 2012-07-19 MED ORDER — SUCCINYLCHOLINE CHLORIDE 20 MG/ML IJ SOLN
INTRAMUSCULAR | Status: DC | PRN
  Administered 2012-07-19: 100 mg via INTRAVENOUS

## 2012-07-19 MED ORDER — BUPIVACAINE-EPINEPHRINE 0.25% -1:200000 IJ SOLN
INTRAMUSCULAR | Status: AC
  Filled 2012-07-19: qty 2

## 2012-07-19 MED ORDER — HYDROMORPHONE HCL PF 1 MG/ML IJ SOLN
0.2500 mg | INTRAMUSCULAR | Status: DC | PRN
  Administered 2012-07-19 (×4): 0.5 mg via INTRAVENOUS

## 2012-07-19 MED ORDER — PROMETHAZINE HCL 25 MG/ML IJ SOLN
6.2500 mg | INTRAMUSCULAR | Status: AC | PRN
  Administered 2012-07-19 (×2): 6.25 mg via INTRAVENOUS

## 2012-07-19 MED ORDER — GABAPENTIN 300 MG PO CAPS
300.0000 mg | ORAL_CAPSULE | Freq: Every day | ORAL | Status: DC
  Administered 2012-07-19 – 2012-07-22 (×4): 300 mg via ORAL
  Filled 2012-07-19 (×5): qty 1

## 2012-07-19 MED ORDER — HYDROCHLOROTHIAZIDE 12.5 MG PO CAPS
12.5000 mg | ORAL_CAPSULE | Freq: Every day | ORAL | Status: DC
  Administered 2012-07-20 – 2012-07-21 (×2): 12.5 mg via ORAL
  Filled 2012-07-19 (×4): qty 1

## 2012-07-19 MED ORDER — ACETAMINOPHEN 500 MG PO TABS
1000.0000 mg | ORAL_TABLET | Freq: Three times a day (TID) | ORAL | Status: DC
  Administered 2012-07-19 – 2012-07-22 (×10): 1000 mg via ORAL
  Filled 2012-07-19 (×2): qty 2
  Filled 2012-07-19: qty 1
  Filled 2012-07-19 (×12): qty 2

## 2012-07-19 MED ORDER — KETOROLAC TROMETHAMINE 30 MG/ML IJ SOLN
INTRAMUSCULAR | Status: DC | PRN
  Administered 2012-07-19: 30 mg via INTRAVENOUS

## 2012-07-19 MED ORDER — LIP MEDEX EX OINT
1.0000 "application " | TOPICAL_OINTMENT | Freq: Two times a day (BID) | CUTANEOUS | Status: DC
  Administered 2012-07-19 – 2012-07-22 (×6): 1 via TOPICAL
  Filled 2012-07-19: qty 7

## 2012-07-19 MED ORDER — CEFAZOLIN SODIUM-DEXTROSE 2-3 GM-% IV SOLR
2.0000 g | INTRAVENOUS | Status: AC
  Administered 2012-07-19: 2 g via INTRAVENOUS

## 2012-07-19 MED ORDER — STERILE WATER FOR IRRIGATION IR SOLN
Status: DC | PRN
  Administered 2012-07-19: 1500 mL via INTRAVESICAL

## 2012-07-19 MED ORDER — AMITRIPTYLINE HCL 25 MG PO TABS
25.0000 mg | ORAL_TABLET | Freq: Every day | ORAL | Status: DC
  Administered 2012-07-19 – 2012-07-22 (×4): 25 mg via ORAL
  Filled 2012-07-19 (×5): qty 1

## 2012-07-19 MED ORDER — METRONIDAZOLE IN NACL 5-0.79 MG/ML-% IV SOLN
INTRAVENOUS | Status: DC | PRN
  Administered 2012-07-19: 500 mg via INTRAVENOUS

## 2012-07-19 MED ORDER — ACETAMINOPHEN 10 MG/ML IV SOLN
INTRAVENOUS | Status: AC
  Filled 2012-07-19: qty 100

## 2012-07-19 MED ORDER — GLYCOPYRROLATE 0.2 MG/ML IJ SOLN
INTRAMUSCULAR | Status: DC | PRN
  Administered 2012-07-19: .8 mg via INTRAVENOUS

## 2012-07-19 MED ORDER — LACTATED RINGERS IV SOLN
INTRAVENOUS | Status: DC | PRN
  Administered 2012-07-19 (×3): via INTRAVENOUS

## 2012-07-19 MED ORDER — SODIUM CHLORIDE 0.9 % IJ SOLN: 3.0000 mL | INTRAMUSCULAR | Status: DC | PRN

## 2012-07-19 MED ORDER — LISINOPRIL-HYDROCHLOROTHIAZIDE 20-12.5 MG PO TABS: 1.0000 | ORAL_TABLET | Freq: Every morning | ORAL | Status: DC

## 2012-07-19 MED ORDER — PROPOFOL 10 MG/ML IV BOLUS
INTRAVENOUS | Status: DC | PRN
  Administered 2012-07-19: 160 mg via INTRAVENOUS

## 2012-07-19 MED ORDER — MIDAZOLAM HCL 5 MG/5ML IJ SOLN
INTRAMUSCULAR | Status: DC | PRN
  Administered 2012-07-19 (×2): 1 mg via INTRAVENOUS

## 2012-07-19 MED ORDER — OXYCODONE HCL 5 MG PO TABS: 5.0000 mg | ORAL_TABLET | ORAL | Status: DC | PRN

## 2012-07-19 MED ORDER — METRONIDAZOLE IN NACL 5-0.79 MG/ML-% IV SOLN
INTRAVENOUS | Status: AC
  Filled 2012-07-19: qty 100

## 2012-07-19 MED ORDER — DIPHENHYDRAMINE HCL 50 MG/ML IJ SOLN
12.5000 mg | Freq: Four times a day (QID) | INTRAMUSCULAR | Status: DC | PRN
  Administered 2012-07-19 – 2012-07-20 (×2): 25 mg via INTRAVENOUS
  Filled 2012-07-19 (×2): qty 1

## 2012-07-19 MED ORDER — LISINOPRIL 20 MG PO TABS
20.0000 mg | ORAL_TABLET | Freq: Every day | ORAL | Status: DC
  Administered 2012-07-20 – 2012-07-21 (×2): 20 mg via ORAL
  Filled 2012-07-19 (×4): qty 1

## 2012-07-19 MED ORDER — ONDANSETRON HCL 4 MG/2ML IJ SOLN
4.0000 mg | Freq: Four times a day (QID) | INTRAMUSCULAR | Status: DC | PRN
  Administered 2012-07-20: 4 mg via INTRAVENOUS
  Filled 2012-07-19: qty 2

## 2012-07-19 MED ORDER — LACTATED RINGERS IV SOLN
INTRAVENOUS | Status: DC
  Administered 2012-07-19: 1000 mL via INTRAVENOUS
  Administered 2012-07-20 – 2012-07-21 (×2): via INTRAVENOUS

## 2012-07-19 MED ORDER — PROMETHAZINE HCL 25 MG/ML IJ SOLN: 12.5000 mg | Freq: Four times a day (QID) | INTRAMUSCULAR | Status: DC | PRN

## 2012-07-19 SURGICAL SUPPLY — 68 items
APPLIER CLIP 5 13 M/L LIGAMAX5 (MISCELLANEOUS)
APR CLP MED LRG 5 ANG JAW (MISCELLANEOUS)
BINDER ABD UNIV 12 45-62 (WOUND CARE) ×1 IMPLANT
BINDER ABDOMINAL 46IN 62IN (WOUND CARE) ×3
BLADE HEX COATED 2.75 (ELECTRODE) ×2 IMPLANT
CANISTER SUCTION 2500CC (MISCELLANEOUS) ×3 IMPLANT
CATH KIT ON-Q SILVERSOAK 7.5 (CATHETERS) IMPLANT
CATH KIT ON-Q SILVERSOAK 7.5IN (CATHETERS) IMPLANT
CELLS DAT CNTRL 66122 CELL SVR (MISCELLANEOUS) ×2 IMPLANT
CLIP APPLIE 5 13 M/L LIGAMAX5 (MISCELLANEOUS) IMPLANT
CLOTH BEACON ORANGE TIMEOUT ST (SAFETY) ×3 IMPLANT
COVER MAYO STAND STRL (DRAPES) ×4 IMPLANT
DECANTER SPIKE VIAL GLASS SM (MISCELLANEOUS) ×3 IMPLANT
DEVICE SECURE STRAP 25 ABSORB (INSTRUMENTS) IMPLANT
DEVICE TROCAR PUNCTURE CLOSURE (ENDOMECHANICALS) ×2 IMPLANT
DRAPE LAPAROSCOPIC ABDOMINAL (DRAPES) ×3 IMPLANT
DRSG TEGADERM 2-3/8X2-3/4 SM (GAUZE/BANDAGES/DRESSINGS) ×3 IMPLANT
ELECT REM PT RETURN 9FT ADLT (ELECTROSURGICAL) ×3
ELECTRODE REM PT RTRN 9FT ADLT (ELECTROSURGICAL) ×2 IMPLANT
FILTER SMOKE EVAC LAPAROSHD (FILTER) IMPLANT
GAUZE SPONGE 2X2 8PLY STRL LF (GAUZE/BANDAGES/DRESSINGS) IMPLANT
GLOVE ECLIPSE 8.0 STRL XLNG CF (GLOVE) ×3 IMPLANT
GLOVE INDICATOR 8.0 STRL GRN (GLOVE) ×6 IMPLANT
GOWN STRL NON-REIN LRG LVL3 (GOWN DISPOSABLE) ×1 IMPLANT
GOWN STRL REIN XL XLG (GOWN DISPOSABLE) ×10 IMPLANT
HAND ACTIVATED (MISCELLANEOUS) ×2 IMPLANT
IV LACTATED RINGERS 1000ML (IV SOLUTION) ×2 IMPLANT
KIT BASIN OR (CUSTOM PROCEDURE TRAY) ×3 IMPLANT
MARKER SKIN DUAL TIP RULER LAB (MISCELLANEOUS) ×3 IMPLANT
NDL SPNL 22GX3.5 QUINCKE BK (NEEDLE) IMPLANT
NEEDLE SPNL 22GX3.5 QUINCKE BK (NEEDLE) ×3 IMPLANT
NS IRRIG 1000ML POUR BTL (IV SOLUTION) ×3 IMPLANT
PENCIL BUTTON HOLSTER BLD 10FT (ELECTRODE) ×2 IMPLANT
RELOAD PROXIMATE 75MM BLUE (ENDOMECHANICALS) ×3 IMPLANT
RELOAD STAPLE 75 3.8 BLU REG (ENDOMECHANICALS) IMPLANT
RETRACTOR WND ALEXIS 18 MED (MISCELLANEOUS) IMPLANT
RTRCTR WOUND ALEXIS 18CM MED (MISCELLANEOUS) ×3
SCISSORS LAP 5X35 DISP (ENDOMECHANICALS) ×3 IMPLANT
SET IRRIG TUBING LAPAROSCOPIC (IRRIGATION / IRRIGATOR) ×2 IMPLANT
SLEEVE XCEL OPT CAN 5 100 (ENDOMECHANICALS) ×8 IMPLANT
SLEEVE Z-THREAD 5X100MM (TROCAR) ×2 IMPLANT
SPONGE GAUZE 2X2 STER 10/PKG (GAUZE/BANDAGES/DRESSINGS)
SPONGE LAP 18X18 X RAY DECT (DISPOSABLE) ×2 IMPLANT
STAPLER GUN LINEAR PROX 60 (STAPLE) ×2 IMPLANT
STAPLER PROXIMATE 75MM BLUE (STAPLE) ×2 IMPLANT
STRIP CLOSURE SKIN 1/2X4 (GAUZE/BANDAGES/DRESSINGS) IMPLANT
SUCTION POOLE TIP (SUCTIONS) ×2 IMPLANT
SUT MNCRL AB 4-0 PS2 18 (SUTURE) ×3 IMPLANT
SUT PDS AB 1 CTX 36 (SUTURE) ×8 IMPLANT
SUT PROLENE 1 CT 1 30 (SUTURE) ×12 IMPLANT
SUT SILK 2 0 (SUTURE) ×3
SUT SILK 2 0 SH CR/8 (SUTURE) ×4 IMPLANT
SUT SILK 2-0 18XBRD TIE 12 (SUTURE) ×1 IMPLANT
SUT SILK 3 0 (SUTURE) ×3
SUT SILK 3 0 SH 30 (SUTURE) ×4 IMPLANT
SUT SILK 3-0 18XBRD TIE 12 (SUTURE) ×1 IMPLANT
SUT VIC AB 2-0 UR6 27 (SUTURE) IMPLANT
TOWEL OR 17X26 10 PK STRL BLUE (TOWEL DISPOSABLE) ×3 IMPLANT
TRAY FOLEY CATH 14FRSI W/METER (CATHETERS) ×2 IMPLANT
TRAY LAP CHOLE (CUSTOM PROCEDURE TRAY) ×3 IMPLANT
TROCAR BLADELESS OPT 5 100 (ENDOMECHANICALS) ×2 IMPLANT
TROCAR XCEL BLADELESS 5X75MML (TROCAR) ×3 IMPLANT
TROCAR Z-THREAD FIOS 11X100 BL (TROCAR) ×1 IMPLANT
TROCAR Z-THREAD FIOS 5X100MM (TROCAR) ×1 IMPLANT
TUBING INSUFFLATION 10FT LAP (TUBING) ×3 IMPLANT
TUNNELER SHEATH ON-Q 16GX12 DP (PAIN MANAGEMENT) ×1 IMPLANT
YANKAUER SUCT BULB TIP 10FT TU (MISCELLANEOUS) ×2 IMPLANT
YANKAUER SUCT BULB TIP NO VENT (SUCTIONS) ×2 IMPLANT

## 2012-07-19 NOTE — Interval H&P Note (Signed)
History and Physical Interval Note:  07/19/2012 11:30 AM  [name]  has presented today for surgery, with the diagnosis of recurrent SBO reccurent ventral wall hernia  The various methods of treatment have been discussed with the patient and family. After consideration of risks, benefits and other options for treatment, the patient has consented to  Procedure(s): LAPAROSCOPIC LYSIS OF ADHESIONS, VENTRAL WALL HERNIA REPAIR WITH MESH (N/A) INSERTION OF MESH (N/A) as a surgical intervention .  The patient's history has been reviewed, patient examined, no change in status, stable for surgery.  I have reviewed the patient's chart and labs.  Questions were answered to the patient's satisfaction.     GROSS,STEVEN C.

## 2012-07-19 NOTE — Transfer of Care (Signed)
Immediate Anesthesia Transfer of Care Note  Patient: [name]  Procedure(s) Performed: Procedure(s): LAPAROSCOPIC LYSIS OF ADHESIONS, SMALL BOWEL RESECTION, SEROSAL REPAIR, PRIMARY VENTRAL HERNIA REPAIR (N/A)  Patient Location: PACU  Anesthesia Type:General  Level of Consciousness: awake, alert  and oriented  Airway & Oxygen Therapy: Patient Spontanous Breathing and Patient connected to face mask oxygen  Post-op Assessment: Report given to PACU RN and Post -op Vital signs reviewed and stable  Post vital signs: Reviewed and stable  Complications: No apparent anesthesia complications

## 2012-07-19 NOTE — Anesthesia Preprocedure Evaluation (Signed)
Anesthesia Evaluation  Patient identified by MRN, date of birth, ID band Patient awake    Reviewed: Allergy & Precautions, H&P , NPO status , Patient's Chart, lab work & pertinent test results  Airway Mallampati: II TM Distance: >3 FB Neck ROM: Full    Dental  (+) Teeth Intact   Pulmonary neg pulmonary ROS,  breath sounds clear to auscultation  Pulmonary exam normal       Cardiovascular hypertension, Rhythm:Regular Rate:Normal     Neuro/Psych Chronic pain negative neurological ROS  negative psych ROS   GI/Hepatic negative GI ROS, Neg liver ROS,   Endo/Other  Morbid obesity  Renal/GU negative Renal ROS   Hx of endometrial cancer    Musculoskeletal negative musculoskeletal ROS (+)   Abdominal   Peds  Hematology negative hematology ROS (+)   Anesthesia Other Findings   Reproductive/Obstetrics negative OB ROS                           Anesthesia Physical Anesthesia Plan  ASA: II  Anesthesia Plan: General   Post-op Pain Management:    Induction: Intravenous  Airway Management Planned: Oral ETT  Additional Equipment:   Intra-op Plan:   Post-operative Plan: Extubation in OR  Informed Consent: I have reviewed the patients History and Physical, chart, labs and discussed the procedure including the risks, benefits and alternatives for the proposed anesthesia with the patient or authorized representative who has indicated his/her understanding and acceptance.   Dental advisory given  Plan Discussed with: CRNA  Anesthesia Plan Comments:         Anesthesia Quick Evaluation

## 2012-07-19 NOTE — Op Note (Signed)
07/19/2012  3:00 PM  PATIENT:  [name]  67 y.o. female  Patient Care Team: Ernestina Penna, MD as PCP - General (Family Medicine) Bernita Buffy. Duard Brady, MD as Consulting Physician (Gynecologic Oncology) Mardella Layman, MD as Consulting Physician (Gastroenterology) Crecencio Mc, MD as Consulting Physician (Urology)  PRE-OPERATIVE DIAGNOSIS:  recurrent SBO reccurent ventral wall hernia  POST-OPERATIVE DIAGNOSIS:    Jejunal mass / adhesion recurrent ventral wall hernia  PROCEDURE:  Procedure(s): LAPAROSCOPIC LYSIS OF ADHESIONS JEJUNAL SMALL BOWEL RESECTION COLON SEROSAL REPAIR PRIMARY VENTRAL HERNIA REPAIR  SURGEON:  Surgeon(s): Ardeth Sportsman, MD  ASSISTANT: Romie Levee   ANESTHESIA:   local and general  EBL:  Total I/O In: 2000 [I.V.:2000] Out: 150 [Urine:50; Blood:100]  Delay start of Pharmacological VTE agent (>24hrs) due to surgical blood loss or risk of bleeding:  no  DRAINS: none   SPECIMEN:  Source of Specimen:  Jejunum   DISPOSITION OF SPECIMEN:  PATHOLOGY  COUNTS:  YES  PLAN OF CARE: Admit to inpatient   PATIENT DISPOSITION:  PACU - hemodynamically stable.  INDICATION:  Pleasant morbidly obese female with intermittent bouts of nausea vomiting.  Having to go to emergency room.  History prior abdominal surgeries including anterior spinal repair, ureter lysis with ureter repair, hysterectomy.  Also with recurrent periumbilical ventral incisional hernia.  I recommended surgery to address her issues of recurrent partial obstructions and hernia:  The anatomy & physiology of the digestive tract was discussed.  The pathophysiology of perforation was discussed.  Differential diagnosis such as perforated ulcer or colon, etc was discussed.   Natural history risks without surgery were discussed.  I recommended abdominal exploration to diagnose & treat the source of the problem.  Laparoscopic & open techniques were discussed.   Risks such as bleeding, infection,  abscess, leak, reoperation, bowel resection, possible ostomy, hernia, heart attack, death, and other risks were discussed.   The risks of no intervention will lead to serious problems including death.   I expressed a good likelihood that surgery will address the problem.    Goals of post-operative recovery were discussed as well.  We will work to minimize complications.   Questions were answered.  The patient expressed understanding & wishes to proceed with surgery.      OR FINDINGS: She is moderately density lesions of greater omentum and colon to the anterior abdominal wall.  She had no interloop adhesions of small bowel.  In her distal jejunum, she did have a densely folded region x 10cm.  A stellate scar is well.  Suspicious for possible early carcinoid vs. severe adhesion vs. Trapped Meckel's diverticulum.    Type of repair - primary repair with internal retention suture.  No mesh  (choices - primary suture, mesh, or component)   DESCRIPTION:   Informed consent was confirmed. The patient underwent general anaesthesia without difficulty. The patient was positioned appropriately. VTE prevention in place. The patient's abdomen was clipped, prepped, & draped in a sterile fashion. Surgical timeout confirmed our plan.  The patient was positioned in reverse Trendelenburg. Abdominal entry was gained using optical entry technique in the right upper abdomen. Entry was clean. I induced carbon dioxide insufflation. Camera inspection revealed no injury. Extra ports were carefully placed under direct laparoscopic visualization.   I could see A moderate volume of adhesions centrally towards the left upper quadrant.  We did laparoscopic lysis of adhesions to expose the entire anterior abdominal wall.  I initially used and focused cold scissors And the  avascular areas.  However, there were some more denser thick band that required ultrasonic dissection with a harmonic scalpel.  I made sure hemostasis was good.   Inspecting the area we noticed a serosa tear in the distal transverse colon.  We repaired it with interrupted silk stitches to good result.  We were able to easily locate the ileocecal region.  We ran the small bowel proximally towards the ligament of Treitz.  There were no interloop adhesions.  Near the junction of the jejunum and ileum, there was a dense S-shaped type fold/mass of the small intestine.  This seemed to be the only abnormality.  Rest of the colon seemed well decompressed.  The bowel is not dilated.  We could see periumbilical ventral hernias.  One supraumbilical 3 x 3 cm.  It incarcerated with omentum.  A smaller umbilical one 2 x 2 centimeters.  Also with omentum within it.  This had been released down.  Since that was the only abnormality along the digestive tract, we decided to see if we could open it up.  We did cold lysis of adhesions with scissors to release some of the folds in the mass.  However it became apparent that there was a stellate-shaped scar on the antimesenteric side.  Mesentery densely adherent as well.  Could not seen planes well.  We were skeptical that this was going to resolve with just lysis of adhesions only.  We therefore decided to proceed with more definitive exploration.    We placed a wound protector through the hernias, connecting them together for a 6 cm fascial defect.  We eviscerated the small bowel.  We could fail some scar and see a stellate pattern.  We decided to resect that jejunal section.  We did a side-to-side stapled anastomosis using a 75 GIA stapler in stapled off the common staple defect using a TA 60.  15cm removed.  Mesentery were resected with clamps and ties and mesenteric defect closed using interrupted silk stitches.  The anastomosis had good patency.  The exposed TA staple line had been tucked under silk stitches.  We returned the bowel the abdomen.  We did copious irrigation of over 2 L.  Hemostasis was good.  We changed gloves.  We  clamped off the wound protector.  We did diagnostic laparoscopy.  They will more irrigation.  Serosal repair intact on the colon.  Anastomosis fine.  No other maladies.  Evacuated carbon dioxide.  We decided to primarily repair the ventral hernia since that a bowel resection.  Not a good idea for permanent mesh.  I released some subcutaneous tissues off the anterior fascia.  I reapproximated the hernia defect using #1 PDS in a running fashion.  Also placed a couple of large #1 PDS internal retention stitches as well.  We closed skin using 4 Monocryl centrally with a couple Betadine soaked umbilical tape wicks at the ventral wall hernia closure.  Steri-Strips applied.  The port sites closed with sterile dressings.  Patient being extubated.  She will need to stay at least overnight, probably several days.

## 2012-07-19 NOTE — H&P (View-Only) (Signed)
Subjective:     Patient ID: [name], female   DOB: June 20, 1945, 67 y.o.   MRN: [id number]  HPI  [name]  16-May-1945 [id number]  Patient Care Team: Ernestina Penna, MD as PCP - General (Family Medicine) Bernita Buffy. Duard Brady, MD as Consulting Physician (Gynecologic Oncology) Mardella Layman, MD as Consulting Physician (Gastroenterology) Crecencio Mc, MD as Consulting Physician (Urology)  This patient is a 67 y.o.female who presents today for surgical evaluation at the request of self.   Reason for visit: Persistent episodes of nausea vomiting pain.  Recent admission for bowel obstruction.  Pleasant morbidly obese female.  Has had endometrial cancer excise with hysterectomy and salpingo-oophorectomy.  Also had chronic left pyelonephritis requiring a lap converted to open the ureteral lysis and patching of chronic ureteral abscess.  Eventually recovered.  A few months ago has had episodes of intermittent nausea vomiting and cramping.  Severe enough to be admitted to [facility] around Christmas.  CT scan suspicious for bowel structure with classic transition point in the left flank.  It improved when home.  Has had a few more episodes, one almost severe enough to come to the emergency room.  She was concerned and wished to be seen.  She struggles with chronic constipation.  Uses stool softeners as a laxative to have a bowel movement once or twice a week.  Has had hip surgery and arthritis but can walk 15 minutes without much difficulty using a cane for balance.  No evidence of recurrence of endometrial cancer followed by Dr. Lester [place] with [facility].  No history of infections.  Patient Active Problem List  Diagnosis  . History of endometrial cancer s/p TAH BSO June2013  . Incisional hernia  . SBO (small bowel obstruction)  . Hypertension  . Obesity (BMI 30-39.9)  . Chronic pain  . Hypokalemia  . Colon polyp, hyperplastic    Past Medical History  Diagnosis Date  . Endometrioid  adenocarcinoma   . Hypertension   . Obesity (BMI 30-39.9) 04/27/2012  . Arthritis   . Hyperlipidemia   . Ureteral adhesion, left 2010    s/p ureterolysis & stenting Dr. Laverle Patter    Past Surgical History  Procedure Laterality Date  . Abdominal hysterectomy  11/02/10    TAH, BSO, lysis of adhesions for endometrial cancer  . Incisional hernia repair  11/02/10  . Back surgery  1989, 1999, 2009    anterior L2-3, L3-4 arthrodesis  . Kidney surgery  2010    Left kidney  . Bladder suspension  2003  . Foot surgery  06/2010    left foot surgery  . Joint replacement  09/2009    Right total knee  . Total hip arthroplasty  01/2010    Right total hip  . Cataract extraction, bilateral  2003  . Total knee arthroplasty      right  . Ureterolysis  2010    lap/open left ureterolysis & omental flap, ureter stent    History   Social History  . Marital Status: Widowed    Spouse Name: N/A    Number of Children: N/A  . Years of Education: N/A   Occupational History  . Not on file.   Social History Main Topics  . Smoking status: Never Smoker   . Smokeless tobacco: Not on file  . Alcohol Use: No  . Drug Use: No  . Sexually Active: No   Other Topics Concern  . Not on file   Social History Narrative  . No  narrative on file    Family History  Problem Relation Age of Onset  . Diabetes Mother   . Cancer Mother     lung  . Diabetes Father   . Cancer Father     liver  . Colon cancer Sister   . Diabetes Sister   . Cancer Sister     colon    Current Outpatient Prescriptions  Medication Sig Dispense Refill  . amitriptyline (ELAVIL) 25 MG tablet Take 25 mg by mouth at bedtime.      . gabapentin (NEURONTIN) 300 MG capsule Take 300 mg by mouth at bedtime.      Marland Kitchen lisinopril-hydrochlorothiazide (PRINZIDE,ZESTORETIC) 20-12.5 MG per tablet Take 1 tablet by mouth every morning.       . meloxicam (MOBIC) 15 MG tablet Take 15 mg by mouth every morning.       . simvastatin (ZOCOR) 10 MG tablet  Take 10 mg by mouth at bedtime.       No current facility-administered medications for this visit.     Allergies  Allergen Reactions  . Cashew Nut Oil Hives  . Zofran Hives and Nausea Only    BP 132/86  Pulse 76  Temp(Src) 98.2 F (36.8 C) (Temporal)  Resp 18  Ht 5' (1.524 m)  Wt 204 lb 3.2 oz (92.625 kg)  BMI 39.88 kg/m2  No results found.   Review of Systems  Constitutional: Negative for fever, chills, diaphoresis, appetite change and fatigue.  HENT: Negative for ear pain, sore throat, trouble swallowing, neck pain and ear discharge.   Eyes: Negative for photophobia, discharge and visual disturbance.  Respiratory: Negative for cough, choking, chest tightness and shortness of breath.   Cardiovascular: Negative for chest pain and palpitations.  Gastrointestinal: Positive for nausea, vomiting, abdominal pain, constipation and abdominal distention. Negative for diarrhea, blood in stool, anal bleeding and rectal pain.  Genitourinary: Negative for dysuria, urgency, frequency, decreased urine volume, difficulty urinating and pelvic pain.  Musculoskeletal: Negative for myalgias and gait problem.  Skin: Negative for color change, pallor and rash.  Neurological: Negative for dizziness, speech difficulty, weakness and numbness.  Hematological: Negative for adenopathy.  Psychiatric/Behavioral: Negative for confusion and agitation. The patient is not nervous/anxious.        Objective:   Physical Exam  Constitutional: She is oriented to person, place, and time. She appears well-developed and well-nourished. No distress.  HENT:  Head: Normocephalic.  Mouth/Throat: Oropharynx is clear and moist. No oropharyngeal exudate.  Eyes: Conjunctivae and EOM are normal. Pupils are equal, round, and reactive to light. No scleral icterus.  Neck: Normal range of motion. Neck supple. No tracheal deviation present.  Cardiovascular: Normal rate, regular rhythm and intact distal pulses.     Pulmonary/Chest: Effort normal and breath sounds normal. No respiratory distress. She exhibits no tenderness.  Abdominal: Soft. Bowel sounds are normal. She exhibits no distension and no mass. There is no tenderness. There is no rigidity, no guarding, no CVA tenderness, no tenderness at McBurney's point and negative Murphy's sign. A hernia is present. Hernia confirmed positive in the ventral area. Hernia confirmed negative in the right inguinal area and confirmed negative in the left inguinal area.    Genitourinary: No vaginal discharge found.  Musculoskeletal: Normal range of motion. She exhibits no tenderness.  Lymphadenopathy:    She has no cervical adenopathy.       Right: No inguinal adenopathy present.       Left: No inguinal adenopathy present.  Neurological: She is alert  and oriented to person, place, and time. No cranial nerve deficit. She exhibits normal muscle tone. Coordination normal.  Skin: Skin is warm and dry. No rash noted. She is not diaphoretic. No erythema.  Psychiatric: She has a normal mood and affect. Her speech is normal and behavior is normal. Judgment and thought content normal. Cognition and memory are normal.  Very pleasant and affable.  Laughs a lot       Assessment:     Recurrent episodes of nausea vomiting and abdominal pain with documented bowel obstruction the past.  Concerning for persistent adhesion disease.  History of endometrial cancer status post hysterectomy with no evidence of recurrence on CT scan.  History of left ureteral abscess/pyelonephritis in status post repair.  No evidence of abscess.  Ventral wall incisional hernia periumbilical.  Not massive.  No evidence of hernias elsewhere.     Plan:     I think that she would benefit from surgery to address her persistent area of obstruction.  It started out laparoscopically.  Possible conversion to open.  There was concern of retroperitoneal fibrosis but I have a feeling that was related to her  chronic infection and is not as much of an issue.  While and there, I would repair her incisional hernias periumbilical he.  She her daughter are very interested in proceeding with surgery since she starting had three or four episodes in only a few months, one requiring admission.  The anatomy & physiology of the digestive tract was discussed.  The pathophysiology of intestinal obstruction was discussed.  Natural history risks without surgery was discussed.   I feel the patient has failed non-operative therapies.  The risks of no intervention will lead to serious problems such as necrosis, perforation, dehydration, etc. that outweigh the operative risks; therefore, I recommended abdominal exploration to diagnose & treat the source of the problem.  Laparoscopic & open techniques were discussed.   I expressed a good likelihood that surgery will treat the problem.  The anatomy & physiology of the abdominal wall was discussed.  The pathophysiology of hernias was discussed.  Natural history risks without surgery including progeressive enlargement, pain, incarceration & strangulation was discussed.   Contributors to complications such as smoking, obesity, diabetes, prior surgery, etc were discussed.   I feel the risks of no intervention will lead to serious problems that outweigh the operative risks; therefore, I recommended surgery to reduce and repair the hernia.  I explained laparoscopic techniques with possible need for an open approach.  I noted the probable use of mesh to patch and/or buttress the hernia repair.  Risks such as bleeding, infection, abscess, need for further treatment, heart attack, death, and other risks were discussed.  I noted a good likelihood this will help address the problem.   Goals of post-operative recovery were discussed as well.  Possibility that this will not correct all symptoms was explained.  I stressed the importance of low-impact activity, aggressive pain control, avoiding  constipation, & not pushing through pain to minimize risk of post-operative chronic pain or injury. Possibility of reherniation especially with smoking, obesity, diabetes, immunosuppression, and other health conditions was discussed.  We will work to minimize complications.     An educational handout further explaining the pathology & treatment options was given as well.  Questions were answered.  The patient expresses understanding & wishes to proceed with surgery.

## 2012-07-19 NOTE — Anesthesia Postprocedure Evaluation (Signed)
  Anesthesia Post-op Note  Patient: [name]  Procedure(s) Performed: Procedure(s) (LRB): LAPAROSCOPIC LYSIS OF ADHESIONS, SMALL BOWEL RESECTION, SEROSAL REPAIR, PRIMARY VENTRAL HERNIA REPAIR (N/A)  Patient Location: PACU  Anesthesia Type: General  Level of Consciousness: awake and alert   Airway and Oxygen Therapy: Patient Spontanous Breathing  Post-op Pain: mild  Post-op Assessment: Post-op Vital signs reviewed, Patient's Cardiovascular Status Stable, Respiratory Function Stable, Patent Airway and No signs of Nausea or vomiting  Last Vitals:  Filed Vitals:   07/19/12 1510  BP: 147/72  Pulse: 72  Temp: 36.8 C  Resp: 17    Post-op Vital Signs: stable   Complications: No apparent anesthesia complications

## 2012-07-20 ENCOUNTER — Encounter (HOSPITAL_COMMUNITY): Payer: Self-pay | Admitting: Surgery

## 2012-07-20 LAB — CBC
HCT: 37 % (ref 36.0–46.0)
MCV: 93.2 fL (ref 78.0–100.0)
Platelets: 257 10*3/uL (ref 150–400)
RBC: 3.97 MIL/uL (ref 3.87–5.11)
WBC: 10.2 10*3/uL (ref 4.0–10.5)

## 2012-07-20 LAB — BASIC METABOLIC PANEL
Calcium: 8.3 mg/dL — ABNORMAL LOW (ref 8.4–10.5)
GFR calc Af Amer: >90 mL/min (ref >90)
GFR calc non Af Amer: >90 mL/min (ref >90)
Potassium: 3.3 mEq/L — ABNORMAL LOW (ref 3.5–5.1)
Sodium: 136 mEq/L (ref 135–145)

## 2012-07-20 NOTE — Progress Notes (Signed)
[name] [id number] March 20, 1946  CARE TEAM:  PCP: Rudi Heap, MD  Outpatient Care Team: Patient Care Team: Ernestina Penna, MD as PCP - General (Family Medicine) Bernita Buffy. Duard Brady, MD as Consulting Physician (Gynecologic Oncology) Mardella Layman, MD as Consulting Physician (Gastroenterology) Crecencio Mc, MD as Consulting Physician (Urology)  Inpatient Treatment Team: Treatment Team: Attending Varick Keys: Ardeth Sportsman, MD; Registered Nurse: Leonie Douglas, RN   Subjective:  Mild nausea Pain less than before surgery per pt Walked in hallways Foley out  Objective:  Vital signs:  Filed Vitals:   07/19/12 1830 07/19/12 1930 07/19/12 2208 07/20/12 0218  BP: 128/57 138/65 109/67 110/69  Pulse: 81 79 77 68  Temp: 98 F (36.7 C) 98.1 F (36.7 C) 97.8 F (36.6 C) 98 F (36.7 C)  TempSrc: Oral Oral Oral Oral  Resp: 20 18 18 18   Height:      Weight:      SpO2: 96% 97% 97% 99%    Last BM Date: 07/19/12  Intake/Output   Yesterday:  03/13 0701 - 03/14 0700 In: 4287.5 [P.O.:600; I.V.:3687.5] Out: 990 [Urine:890; Blood:100] This shift:  Total I/O In: 1197.5 [P.O.:600; I.V.:597.5] Out: 750 [Urine:750]  Bowel function:  Flatus: n  BM: n  Drain: n/a  Physical Exam:  General: Pt awake/alert/oriented x4 in no acute distress Eyes: PERRL, normal EOM.  Sclera clear.  No icterus Neuro: CN II-XII intact w/o focal sensory/motor deficits. Lymph: No head/neck/groin lymphadenopathy Psych:  No delerium/psychosis/paranoia HENT: Normocephalic, Mucus membranes moist.  No thrush Neck: Supple, No tracheal deviation Chest: No chest wall pain w good excursion CV:  Pulses intact.  Regular rhythm MS: Normal AROM mjr joints.  No obvious deformity Abdomen: Soft.  Nondistended.  Obese.  Mildly tender at incisions only.  No incarcerated hernias.  Dressings clean Ext:  SCDs BLE.  No mjr edema.  No cyanosis Skin: No petechiae / purpurae]  Results:   Labs: Results for  orders placed during the hospital encounter of 07/19/12 (from the past 48 hour(s))  CBC     Status: Abnormal   Collection Time    07/19/12  5:01 PM      Result Value Range   WBC 16.8 (*) 4.0 - 10.5 K/uL   RBC 4.10  3.87 - 5.11 MIL/uL   Hemoglobin 13.2  12.0 - 15.0 g/dL   HCT 30.8  65.7 - 84.6 %   MCV 93.2  78.0 - 100.0 fL   MCH 32.2  26.0 - 34.0 pg   MCHC 34.6  30.0 - 36.0 g/dL   RDW 96.2  95.2 - 84.1 %   Platelets 266  150 - 400 K/uL  CREATININE, SERUM     Status: None   Collection Time    07/19/12  5:01 PM      Result Value Range   Creatinine, Ser 0.64  0.50 - 1.10 mg/dL   GFR calc non Af Amer >90  >90 mL/min   GFR calc Af Amer >90  >90 mL/min   Comment:            The eGFR has been calculated     using the CKD EPI equation.     This calculation has not been     validated in all clinical     situations.     eGFR's persistently     <90 mL/min signify     possible Chronic Kidney Disease.  BASIC METABOLIC PANEL     Status: Abnormal   Collection  Time    07/20/12  4:45 AM      Result Value Range   Sodium 136  135 - 145 mEq/L   Potassium 3.3 (*) 3.5 - 5.1 mEq/L   Chloride 99  96 - 112 mEq/L   CO2 31  19 - 32 mEq/L   Glucose, Bld 117 (*) 70 - 99 mg/dL   BUN 8  6 - 23 mg/dL   Creatinine, Ser 1.61  0.50 - 1.10 mg/dL   Calcium 8.3 (*) 8.4 - 10.5 mg/dL   GFR calc non Af Amer >90  >90 mL/min   GFR calc Af Amer >90  >90 mL/min   Comment:            The eGFR has been calculated     using the CKD EPI equation.     This calculation has not been     validated in all clinical     situations.     eGFR's persistently     <90 mL/min signify     possible Chronic Kidney Disease.  CBC     Status: None   Collection Time    07/20/12  4:45 AM      Result Value Range   WBC 10.2  4.0 - 10.5 K/uL   RBC 3.97  3.87 - 5.11 MIL/uL   Hemoglobin 12.6  12.0 - 15.0 g/dL   HCT 09.6  04.5 - 40.9 %   MCV 93.2  78.0 - 100.0 fL   MCH 31.7  26.0 - 34.0 pg   MCHC 34.1  30.0 - 36.0 g/dL   RDW  81.1  91.4 - 78.2 %   Platelets 257  150 - 400 K/uL    Imaging / Studies: No results found.  Medications / Allergies: per chart  Antibiotics: Anti-infectives   Start     Dose/Rate Route Frequency Ordered Stop   07/19/12 1405  metroNIDAZOLE (FLAGYL) IVPB 500 mg     500 mg 100 mL/hr over 60 Minutes Intravenous  Once 07/19/12 1413 07/19/12 1405   07/19/12 0948  ceFAZolin (ANCEF) IVPB 2 g/50 mL premix    Comments:  Dose decreased to 2g per P&T policy for weight < 120 kg.   2 g 100 mL/hr over 30 Minutes Intravenous 60 min pre-op 07/19/12 0948 07/19/12 1224      Problem List:   Principal Problem:   Mass of in fold of jejunum s/p SB resection 07/19/2012 Active Problems:   Recurrent ventral incisional hernia s/p primary repair 07/19/2012   SBO (small bowel obstruction) - recurrent   Hypertension   Obesity (BMI 30-39.9)   Chronic pain   Assessment  [name]  67 y.o. female  1 Day Post-Op  Procedure(s): LAPAROSCOPIC LYSIS OF ADHESIONS, SMALL BOWEL RESECTION, SEROSAL REPAIR, PRIMARY VENTRAL HERNIA REPAIR  Stabilizing with ileus  Plan:  -liquids.  Adv to solids post nausea -check labs -HTN controlled  -chronic pain regimen -bowel regimen -VTE prophylaxis- SCDs, etc -mobilize as tolerated to help recovery  D/C patient from hospital when patient meets criteria:  Tolerating oral intake well Ambulating in walkways Adequate pain control without IV medications Urinating  Having flatus   Ardeth Sportsman, M.D., F.A.C.S. Gastrointestinal and Minimally Invasive Surgery [facility] [address] [place], [place] [address] [phone] Main / Paging [phone] Voice Mail   07/20/2012

## 2012-07-21 NOTE — Progress Notes (Signed)
2 Days Post-Op  Subjective: Pt states she did well yesterday. Had some mild nausea around noon - none since then. No flatus. Just ordered solids for lunch. Pain ok. Some discomfort in LLQ  Objective: Vital signs in last 24 hours: Temp:  [98 F (36.7 C)-98.3 F (36.8 C)] 98.2 F (36.8 C) (03/15 0555) Pulse Rate:  [62-93] 93 (03/15 1027) Resp:  [16-18] 16 (03/15 0555) BP: (99-143)/(61-82) 99/65 mmHg (03/15 1027) SpO2:  [93 %-97 %] 93 % (03/15 0555) Last BM Date: 07/19/12  Intake/Output from previous day: 03/14 0701 - 03/15 0700 In: 1420 [P.O.:240; I.V.:1180] Out: 2200 [Urine:2200] Intake/Output this shift: Total I/O In: 120 [P.O.:120] Out: 350 [Urine:350]  Alert, nad cta Reg Soft, obese. Incision c/d/i. hypoBS  Lab Results:   Recent Labs  07/19/12 1701 07/20/12 0445  WBC 16.8* 10.2  HGB 13.2 12.6  HCT 38.2 37.0  PLT 266 257   BMET  Recent Labs  07/19/12 1701 07/20/12 0445  NA  --  136  K  --  3.3*  CL  --  99  CO2  --  31  GLUCOSE  --  117*  BUN  --  8  CREATININE 0.64 0.64  CALCIUM  --  8.3*   PT/INR No results found for this basename: LABPROT, INR,  in the last 72 hours ABG No results found for this basename: PHART, PCO2, PO2, HCO3,  in the last 72 hours  Studies/Results: No results found.  Anti-infectives: Anti-infectives   Start     Dose/Rate Route Frequency Ordered Stop   07/19/12 1405  metroNIDAZOLE (FLAGYL) IVPB 500 mg     500 mg 100 mL/hr over 60 Minutes Intravenous  Once 07/19/12 1413 07/19/12 1405   07/19/12 0948  ceFAZolin (ANCEF) IVPB 2 g/50 mL premix    Comments:  Dose decreased to 2g per P&T policy for weight < 120 kg.   2 g 100 mL/hr over 30 Minutes Intravenous 60 min pre-op 07/19/12 0948 07/19/12 1224      Assessment/Plan: s/p Procedure(s): LAPAROSCOPIC LYSIS OF ADHESIONS, SMALL BOWEL RESECTION, SEROSAL REPAIR, PRIMARY VENTRAL HERNIA REPAIR (N/A)  Advised pt to take it easy with solids. If get nauseous she was advised to  back off po Ambulate, IS Repeat BMET in am to f/u hypokalemia  Mary Sella. Andrey Campanile, MD, FACS General, Bariatric, & Minimally Invasive Surgery [facility], [place]   LOS: 2 days    Atilano Ina 07/21/2012

## 2012-07-22 LAB — BASIC METABOLIC PANEL
CO2: 31 mEq/L (ref 19–32)
Chloride: 101 mEq/L (ref 96–112)
Creatinine, Ser: 0.58 mg/dL (ref 0.50–1.10)
Potassium: 3.4 mEq/L — ABNORMAL LOW (ref 3.5–5.1)

## 2012-07-22 LAB — MAGNESIUM: Magnesium: 1.9 mg/dL (ref 1.5–2.5)

## 2012-07-22 MED ORDER — HYDROCODONE-ACETAMINOPHEN 5-325 MG PO TABS
1.0000 | ORAL_TABLET | ORAL | Status: DC | PRN
  Administered 2012-07-23: 2 via ORAL
  Filled 2012-07-22: qty 2

## 2012-07-22 NOTE — Progress Notes (Signed)
General Surgery Note  LOS: 3 days  POD -   3 Days Post-Op  Assessment/Plan: 1.  LAPAROSCOPIC LYSIS OF ADHESIONS, SMALL BOWEL RESECTION, SEROSAL REPAIR, PRIMARY VENTRAL HERNIA REPAIR - 07/19/2012 - S. Gross  Looks good.  Tolerating reg diet.  No BM yet - will keep at least one more day.  2. DVT prophylaxis - SQ Heparin 3.  Endometrioid adenocarcinoma; s/p TAH,BSO, lysis of adhesions, hernia repair. 11/02/11  4.  Hypertension  5.  Body mass index is 37.5  Subjective:  Feels good.  Minimal pain.  Some flatus, but no BM. Objective:   Filed Vitals:   07/22/12 0433  BP: 116/61  Pulse: 63  Temp: 98.1 F (36.7 C)  Resp:      Intake/Output from previous day:  03/15 0701 - 03/16 0700 In: 1269 [P.O.:540; I.V.:729] Out: 1600 [Urine:1600]  Intake/Output this shift:      Physical Exam:   General: WN obese WF who is alert and oriented.    HEENT: Normal. Pupils equal. .   Lungs: Clear   Abdomen: Soft.  Has bowel sounds.   Wound: Clean.   Lab Results:    Recent Labs  07/19/12 1701 07/20/12 0445  WBC 16.8* 10.2  HGB 13.2 12.6  HCT 38.2 37.0  PLT 266 257    BMET   Recent Labs  07/20/12 0445 07/22/12 0420  NA 136 138  K 3.3* 3.4*  CL 99 101  CO2 31 31  GLUCOSE 117* 104*  BUN 8 7  CREATININE 0.64 0.58  CALCIUM 8.3* 8.7    PT/INR  No results found for this basename: LABPROT, INR,  in the last 72 hours  ABG  No results found for this basename: PHART, PCO2, PO2, HCO3,  in the last 72 hours   Studies/Results:  No results found.   Anti-infectives:   Anti-infectives   Start     Dose/Rate Route Frequency Ordered Stop   07/19/12 1405  metroNIDAZOLE (FLAGYL) IVPB 500 mg     500 mg 100 mL/hr over 60 Minutes Intravenous  Once 07/19/12 1413 07/19/12 1405   07/19/12 0948  ceFAZolin (ANCEF) IVPB 2 g/50 mL premix    Comments:  Dose decreased to 2g per P&T policy for weight < 120 kg.   2 g 100 mL/hr over 30 Minutes Intravenous 60 min pre-op 07/19/12 0948 07/19/12 1224      Ovidio Kin, MD, FACS Pager: [phone],   [facility] Office: [phone] 07/22/2012

## 2012-07-23 ENCOUNTER — Other Ambulatory Visit (HOSPITAL_COMMUNITY): Payer: Medicare Other

## 2012-07-23 NOTE — Care Management Note (Signed)
    Page 1 of 1   07/23/2012     8:35:54 AM   CARE MANAGEMENT NOTE 07/23/2012  Patient:  [name], [name]   Account Number:  [id number]  Date Initiated:  07/20/2012  Documentation initiated by:  Lorenda Ishihara  Subjective/Objective Assessment:   67 yo female admitted s/p lysis of adhesions, SBR, hernia repair. PTA lived at home with family.     Action/Plan:   Home when stable, active with [facility]   Anticipated DC Date:  07/24/2012   Anticipated DC Plan:  HOME W HOME HEALTH SERVICES      DC Planning Services  CM consult      [facility] Choice  Resumption Of Svcs/PTA Provider   Choice offered to / List presented to:             Status of service:  Completed, signed off Medicare Important Message given?   (If response is "NO", the following Medicare IM given date fields will be blank) Date Medicare IM given:   Date Additional Medicare IM given:    Discharge Disposition:  HOME W HOME HEALTH SERVICES  Per UR Regulation:  Reviewed for med. necessity/level of care/duration of stay  If discussed at Long Length of Stay Meetings, dates discussed:    Comments:

## 2012-07-23 NOTE — Discharge Summary (Signed)
Physician Discharge Summary  Patient ID: [name] MRN: [id number] DOB/AGE: [birth year] 67 y.o.  Admit date: 07/19/2012 Discharge date: 07/23/2012  Admission Diagnoses: Principal Problem:   Mass of in fold of jejunum s/p SB resection 07/19/2012 Active Problems:   Recurrent ventral incisional hernia s/p primary repair 07/19/2012   SBO (small bowel obstruction) - recurrent   Hypertension   Obesity (BMI 30-39.9)   Chronic pain  Discharge Diagnoses:  Principal Problem:   Mass of in fold of jejunum s/p SB resection 07/19/2012 Active Problems:   Recurrent ventral incisional hernia s/p primary repair 07/19/2012   SBO (small bowel obstruction) - recurrent   Hypertension   Obesity (BMI 30-39.9)   Chronic pain   Discharged Condition: good  Hospital Course:  The patient underwent surgery on the day of admission.   Postoperatively, the patient mobilized and advanced to a solid diet gradually.  Pain was well-controlled and transitioned off IV medications.    By the time of discharge, the patient was walking well the hallways, eating food well, having flatus.  Pain was-controlled on an oral regimen.  Based on meeting DC criteria and recovering well, I felt it was safe for the patient to be discharged home with close followup.  Instructions were discussed in detail.  They are written as well.     Consults: None  Significant Diagnostic Studies:   POST-OPERATIVE DIAGNOSIS:  Jejunal mass / adhesion  recurrent ventral wall hernia   PROCEDURE: Procedure(s):  LAPAROSCOPIC LYSIS OF ADHESIONS  JEJUNAL SMALL BOWEL RESECTION  COLON SEROSAL REPAIR  PRIMARY VENTRAL HERNIA REPAIR      Discharge Exam: Blood pressure 134/59, pulse 64, temperature 98.3 F (36.8 C), temperature source Oral, resp. rate 18, height 5\' 1"  (1.549 m), weight 200 lb (90.719 kg), SpO2 95.00%.  General: Pt awake/alert/oriented x4 in no major acute distress Eyes: PERRL, normal EOM. Sclera nonicteric Neuro: CN  II-XII intact w/o focal sensory/motor deficits. Lymph: No head/neck/groin lymphadenopathy Psych:  No delerium/psychosis/paranoia HENT: Normocephalic, Mucus membranes moist.  No thrush Neck: Supple, No tracheal deviation Chest: No pain.  Good respiratory excursion. CV:  Pulses intact.  Regular rhythm MS: Normal AROM mjr joints.  No obvious deformity Abdomen: Soft, Nondistended.  Obese.  Dressings/incisions c/d/i/.  Nontender.  No incarcerated hernias. Ext:  SCDs BLE.  No significant edema.  No cyanosis Skin: No petechiae / purpurae   Disposition: 01-Home or Self Care  Discharge Orders   Future Appointments Provider Department Dept Phone   08/01/2012 10:00 AM Ardeth Sportsman, MD [facility], [place] [phone]   Future Orders Complete By Expires     Call MD for:  extreme fatigue  As directed     Call MD for:  hives  As directed     Call MD for:  persistant nausea and vomiting  As directed     Call MD for:  redness, tenderness, or signs of infection (pain, swelling, redness, odor or green/yellow discharge around incision site)  As directed     Call MD for:  severe uncontrolled pain  As directed     Call MD for:  As directed     Comments:      Temperature > 101.83F    Diet - low sodium heart healthy  As directed     Discharge instructions  As directed     Comments:      Please see discharge instruction sheets.  Also refer to handout given an office.  Please call our office if you have  any questions or concerns [phone]    Discharge wound care:  As directed     Comments:      Remove all surgical dressings before you leave the hospital.  You do not need to replace dressings over the closed incisions unless you feel more comfortable with a Band-Aid covering it.   Shower/wash over your incisions/body every day   Please call our office [phone] if you have further questions.    Driving Restrictions  As directed     Comments:      No driving until off narcotics and can  safely swerve away without pain during an emergency    Increase activity slowly  As directed     Comments:      Walk an hour a day.  Use 20-30 minute walks.  When you can walk 30 minutes without difficulty, increase to low impact/moderate activities such as biking, jogging, swimming, sexual activity..  Eventually can increase to unrestricted activity when not feeling pain.  If you feel pain: STOP!Marland Kitchen   Let pain protect you from overdoing it.  Use ice/heat/over-the-counter pain medications to help minimize his soreness.  Use pain prescriptions as needed to remain active.  It is better to take extra pain medications and be more active than to stay bedridden to avoid all pain medications.    Lifting restrictions  As directed     Comments:      Avoid heavy lifting initially.  Do not push through pain.  You have no specific weight limit.  Coughing and sneezing or four more stressful to your incision than any lifting you will do. Pain will protect you from injury.  Therefore, avoid intense activity until off all narcotic pain medications.  Coughing and sneezing or four more stressful to your incision than any lifting he will do.    May shower / Bathe  As directed     May walk up steps  As directed     Sexual Activity Restrictions  As directed     Comments:      Sexual activity as tolerated.  Do not push through pain.  Pain will protect you from injury.    Walk with assistance  As directed     Comments:      Walk over an hour a day.  May use a walker/cane/companion to help with balance and stamina.        Medication List    STOP taking these medications       neomycin 500 MG tablet  Commonly known as:  MYCIFRADIN      TAKE these medications       amitriptyline 25 MG tablet  Commonly known as:  ELAVIL  Take 25 mg by mouth at bedtime.     b complex vitamins tablet  Take 1 tablet by mouth daily.     docusate sodium 100 MG capsule  Commonly known as:  COLACE  Take 100 mg by mouth at bedtime.      gabapentin 300 MG capsule  Commonly known as:  NEURONTIN  Take 300 mg by mouth at bedtime.     lisinopril-hydrochlorothiazide 20-12.5 MG per tablet  Commonly known as:  PRINZIDE,ZESTORETIC  Take 1 tablet by mouth every morning.     meloxicam 15 MG tablet  Commonly known as:  MOBIC  Take 15 mg by mouth every morning.     oxyCODONE 5 MG immediate release tablet  Commonly known as:  Oxy IR/ROXICODONE  Take 1-2 tablets (5-10 mg  total) by mouth every 4 (four) hours as needed for pain.     simvastatin 10 MG tablet  Commonly known as:  ZOCOR  Take 10 mg by mouth at bedtime.           Follow-up Information   Follow up with Shields Pautz C., MD. Schedule an appointment as soon as possible for a visit in 3 weeks.   Contact information:   [address] [place] [address] [phone]       Signed: Ardeth Sportsman. 07/23/2012, 6:53 AM

## 2012-07-24 ENCOUNTER — Telehealth (INDEPENDENT_AMBULATORY_CARE_PROVIDER_SITE_OTHER): Payer: Self-pay

## 2012-07-24 NOTE — Telephone Encounter (Signed)
LM w/pt's daughter that pt's path report is good news with no cancer just adhesives in the intestine per Dr Michaell Cowing.

## 2012-08-01 ENCOUNTER — Ambulatory Visit (INDEPENDENT_AMBULATORY_CARE_PROVIDER_SITE_OTHER): Payer: Medicare Other | Admitting: Surgery

## 2012-08-01 ENCOUNTER — Encounter (INDEPENDENT_AMBULATORY_CARE_PROVIDER_SITE_OTHER): Payer: Self-pay | Admitting: Surgery

## 2012-08-01 VITALS — BP 134/78 | HR 92 | Temp 98.4°F | Resp 16 | Ht 61.0 in | Wt 194.6 lb

## 2012-08-01 DIAGNOSIS — K432 Incisional hernia without obstruction or gangrene: Secondary | ICD-10-CM

## 2012-08-01 DIAGNOSIS — E669 Obesity, unspecified: Secondary | ICD-10-CM

## 2012-08-01 DIAGNOSIS — K6389 Other specified diseases of intestine: Secondary | ICD-10-CM

## 2012-08-01 DIAGNOSIS — K56609 Unspecified intestinal obstruction, unspecified as to partial versus complete obstruction: Secondary | ICD-10-CM

## 2012-08-01 NOTE — Progress Notes (Signed)
Subjective:     Patient ID: BURMA KETCHER, female   DOB: 1945/06/24, 67 y.o.   MRN: [id number]  HPI  MAGARET JUSTO  03-21-1946 [id number]  Patient Care Team: Ernestina Penna, MD as PCP - General (Family Medicine) Bernita Buffy. Duard Brady, MD as Consulting Physician (Gynecologic Oncology) Mardella Layman, MD as Consulting Physician (Gastroenterology) Crecencio Mc, MD as Consulting Physician (Urology)  This patient is a 67 y.o.female who presents today for surgical evaluation   POST-OPERATIVE DIAGNOSIS:  Jejunal mass / adhesion  recurrent ventral wall hernia   PROCEDURE: 07/19/2012 LAPAROSCOPIC LYSIS OF ADHESIONS  JEJUNAL SMALL BOWEL RESECTION  COLON SEROSAL REPAIR  PRIMARY VENTRAL HERNIA REPAIR  SURGEON: Surgeon(s):  Ardeth Sportsman, MD  Diagnosis Small intestine, resection SMALL BOWEL WITH SEROSAL ADHESIONS AND HEMORRHAGE, NO ATYPIA OR MALIGNANCY. RESECTION MARGINS VIABLE.  The patient comes in today with her family feeling relatively well.  Some episodes of nausea but no emesis.  Moving bowels okay.  Using prune juice and stool softeners.  No fevers or chills.  Appetite fair.  Eating okay.  Energy level gradually coming back  Patient Active Problem List  Diagnosis  . History of endometrial cancer s/p TAH BSO June2013  . Recurrent ventral incisional hernia s/p primary repair 07/19/2012  . SBO (small bowel obstruction) - recurrent  . Hypertension  . Obesity (BMI 30-39.9)  . Chronic pain  . Hypokalemia  . Mass of in fold of jejunum s/p SB resection 07/19/2012    Past Medical History  Diagnosis Date  . Endometrioid adenocarcinoma   . Hypertension   . Obesity (BMI 30-39.9) 04/27/2012  . Arthritis   . Hyperlipidemia   . Ureteral adhesion, left 2010    s/p ureterolysis & stenting Dr. Dolores Lory now  . 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 Colon polyp, hyperplastic 04/27/2012    Colonoscopy 2010.  No adenomatous  polyps   . History of endometrial cancer s/p TAH BSO June2013 06/15/2011    Grade 1 endometrioid adenocarcinoma with focal superficial myometrial invasion of 0.1 cm with a myometrium of 1.8 cm or approximately 7%. She had a 5-cm tumor. No lymphovascular space Involvement.      Past Surgical History  Procedure Laterality Date  . Abdominal hysterectomy  11/02/10    TAH, BSO, lysis of adhesions for endometrial cancer  . Incisional hernia repair  11/02/10  . Back surgery  1989, 1999, 2009    anterior L2-3, L3-4 arthrodesis  . Kidney surgery  2010    Left kidney  . Bladder suspension  2003  . Foot surgery  06/2010    left foot surgery  . Joint replacement  09/2009    Right total knee  . Total hip arthroplasty  01/2010    Right total hip  . Cataract extraction, bilateral  2003  . Total knee arthroplasty      right  . Ureterolysis  2010    lap/open left ureterolysis & omental flap, ureter stent  . Ventral hernia repair N/A 07/19/2012    Procedure: LAPAROSCOPIC LYSIS OF ADHESIONS, SMALL BOWEL RESECTION, SEROSAL REPAIR, PRIMARY VENTRAL HERNIA REPAIR;  Surgeon: Ardeth Sportsman, MD;  Location: [facility];  Service: General;  Laterality: N/A;  . Hernia repair  07/19/12    lap VWH repair    History   Social History  . Marital Status: Widowed    Spouse Name: N/A    Number of Children: N/A  . Years of Education: N/A  Occupational History  . Not on file.   Social History Main Topics  . Smoking status: Never Smoker   . Smokeless tobacco: Not on file  . Alcohol Use: No  . Drug Use: No  . Sexually Active: No   Other Topics Concern  . Not on file   Social History Narrative  . No narrative on file    Family History  Problem Relation Age of Onset  . Diabetes Mother   . Cancer Mother     lung  . Diabetes Father   . Cancer Father     liver  . Colon cancer Sister   . Diabetes Sister   . Cancer Sister     colon    Current Outpatient Prescriptions  Medication Sig Dispense Refill  .  amitriptyline (ELAVIL) 25 MG tablet Take 25 mg by mouth at bedtime.      Marland Kitchen b complex vitamins tablet Take 1 tablet by mouth daily.      Marland Kitchen docusate sodium (COLACE) 100 MG capsule Take 100 mg by mouth at bedtime.      . gabapentin (NEURONTIN) 300 MG capsule Take 300 mg by mouth at bedtime.      Marland Kitchen lisinopril-hydrochlorothiazide (PRINZIDE,ZESTORETIC) 20-12.5 MG per tablet Take 1 tablet by mouth every morning.       . meloxicam (MOBIC) 15 MG tablet Take 15 mg by mouth every morning.       Marland Kitchen oxyCODONE (OXY IR/ROXICODONE) 5 MG immediate release tablet Take 1-2 tablets (5-10 mg total) by mouth every 4 (four) hours as needed for pain.  50 tablet  0  . simvastatin (ZOCOR) 10 MG tablet Take 10 mg by mouth at bedtime.       No current facility-administered medications for this visit.     Allergies  Allergen Reactions  . Cashew Nut Oil Hives  . Zofran Hives and Nausea Only    BP 134/78  Pulse 92  Temp(Src) 98.4 F (36.9 C) (Temporal)  Resp 16  Ht 5\' 1"  (1.549 m)  Wt 194 lb 9.6 oz (88.27 kg)  BMI 36.79 kg/m2  SpO2 97%  Nm Renal Imaging Flow W/pharm  07/16/2012  *RADIOLOGY REPORT*  Clinical Data: Left renal stricture/ureteral stricture.  NUCLEAR MEDICINE RENAL SCINTIANGIOGRAPHY WITH FLOW AND FUNCTION AND PHARMACOLOGIC AUGMENTATION  Technique:  Radionuclide angiographic and sequential renal images were obtained after intravenous injection of radiopharmaceutical. Imaging was continued during slow intravenous injection of Lasix approximately 20-30 minutes after the start of the examination.  Radiopharmaceutical: 15.0 mCi MAG III  Comparison: Renal scan 05/29/2011  Findings:  Flow:  Prompt symmetric flow to the left and right kidney  Renogram:  Left kidney:  There is prompt cortical uptake.  Some delay in excretion into the left renal collecting system.  There is incomplete clearance of counts from the left renal pelvis following Lasix administration. Moderate postvoid residual.  Right Kidney:  Normal  uptake.  Prompt excretion in the collecting system.  Near complete clearance of counts from the collecting system following Lasix.  Differential:  Left kidney:  43.1% (previous 42.8%)  Right Kidney:  56.9% (previously 57.2%  T1/2 post Lasix:  Left kidney = 30 minutes Right kidney = 9 minutes  IMPRESSION: 1.  Mild obstructive hydronephrosis of the left kidney. 2.  Mild post  void residual in the left. 3.  Findings similar to comparison exam.   Original Report Authenticated By: Genevive Bi, M.D.      Review of Systems  Constitutional: Negative for fever, chills and  diaphoresis.  HENT: Negative for ear pain, sore throat and trouble swallowing.   Eyes: Negative for photophobia and visual disturbance.  Respiratory: Negative for cough and choking.   Cardiovascular: Negative for chest pain and palpitations.  Gastrointestinal: Positive for nausea. Negative for vomiting, abdominal pain, diarrhea, constipation, blood in stool, abdominal distention, anal bleeding and rectal pain.  Genitourinary: Negative for dysuria, frequency and difficulty urinating.  Musculoskeletal: Negative for myalgias and gait problem.  Skin: Negative for color change, pallor and rash.  Neurological: Negative for dizziness, speech difficulty, weakness and numbness.  Hematological: Negative for adenopathy.  Psychiatric/Behavioral: Negative for confusion and agitation. The patient is not nervous/anxious.        Objective:   Physical Exam  Constitutional: She is oriented to person, place, and time. She appears well-developed and well-nourished. No distress.  HENT:  Head: Normocephalic.  Mouth/Throat: Oropharynx is clear and moist. No oropharyngeal exudate.  Eyes: Conjunctivae and EOM are normal. Pupils are equal, round, and reactive to light. No scleral icterus.  Neck: Normal range of motion. No tracheal deviation present.  Cardiovascular: Normal rate and intact distal pulses.   Pulmonary/Chest: Effort normal. No  respiratory distress. She exhibits no tenderness.  Abdominal: Soft. She exhibits no distension. There is no tenderness. There is no rigidity, no guarding and no CVA tenderness. No hernia. Hernia confirmed negative in the ventral area, confirmed negative in the right inguinal area and confirmed negative in the left inguinal area.    Incisions clean with normal healing ridges.  No hernias  Genitourinary: No vaginal discharge found.  Musculoskeletal: Normal range of motion. She exhibits no tenderness.  Lymphadenopathy:       Right: No inguinal adenopathy present.       Left: No inguinal adenopathy present.  Neurological: She is alert and oriented to person, place, and time. No cranial nerve deficit. She exhibits normal muscle tone. Coordination normal.  Skin: Skin is warm and dry. No rash noted. She is not diaphoretic.  Psychiatric: She has a normal mood and affect. Her behavior is normal.  Smiling, pleasant       Assessment:     Less than two weeks out from laparoscopic lysis of adhesions and small bowel resection for chronic strictured segment of intestine with intermittent abdominal pain, recovering OK  Status post primary repair of recurrent incisional hernia.     Plan:     Increase activity as tolerated to regular activity.  Do not push through pain.  Diet as tolerated. Bowel regimen to avoid problems.  Such a high fiber diet with fiber regimen.  Overall, I think she is doing quite well for only two weeks out.  She is in good spirits.  I am hopeful that For her mild nausea and pains should gradually fade away with time.  Return to clinic 3 weeks.   Instructions discussed.  Followup with primary care physician for other health issues as would normally be done.  Questions answered.  The patient expressed understanding and appreciation

## 2012-08-01 NOTE — Patient Instructions (Signed)
GETTING TO GOOD BOWEL HEALTH. Irregular bowel habits such as constipation and diarrhea can lead to many problems over time.  Having one soft bowel movement a day is the most important way to prevent further problems.  The anorectal canal is designed to handle stretching and feces to safely manage our ability to get rid of solid waste (feces, poop, stool) out of our body.  BUT, hard constipated stools can act like ripping concrete bricks and diarrhea can be a burning fire to this very sensitive area of our body, causing inflamed hemorrhoids, anal fissures, increasing risk is perirectal abscesses, abdominal pain/bloating, an making irritable bowel worse.     The goal: ONE SOFT BOWEL MOVEMENT A DAY!  To have soft, regular bowel movements:    Drink at least 8 tall glasses of water a day.     Take plenty of fiber.  Fiber is the undigested part of plant food that passes into the colon, acting s "natures broom" to encourage bowel motility and movement.  Fiber can absorb and hold large amounts of water. This results in a larger, bulkier stool, which is soft and easier to pass. Work gradually over several weeks up to 6 servings a day of fiber (25g a day even more if needed) in the form of: o Vegetables -- Root (potatoes, carrots, turnips), leafy green (lettuce, salad greens, celery, spinach), or cooked high residue (cabbage, broccoli, etc) o Fruit -- Fresh (unpeeled skin & pulp), Dried (prunes, apricots, cherries, etc ),  or stewed ( applesauce)  o Whole grain breads, pasta, etc (whole wheat)  o Bran cereals    Bulking Agents -- This type of water-retaining fiber generally is easily obtained each day by one of the following:  o Psyllium bran -- The psyllium plant is remarkable because its ground seeds can retain so much water. This product is available as Metamucil, Konsyl, Effersyllium, Per Diem Fiber, or the less expensive generic preparation in drug and health food stores. Although labeled a laxative, it really  is not a laxative.  o Methylcellulose -- This is another fiber derived from wood which also retains water. It is available as Citrucel. o Polyethylene Glycol - and "artificial" fiber commonly called Miralax or Glycolax.  It is helpful for people with gassy or bloated feelings with regular fiber o Flax Seed - a less gassy fiber than psyllium   No reading or other relaxing activity while on the toilet. If bowel movements take longer than 5 minutes, you are too constipated   AVOID CONSTIPATION.  High fiber and water intake usually takes care of this.  Sometimes a laxative is needed to stimulate more frequent bowel movements, but    Laxatives are not a good long-term solution as it can wear the colon out. o Osmotics (Milk of Magnesia, Fleets phosphosoda, Magnesium citrate, MiraLax, GoLytely) are safer than  o Stimulants (Senokot, Castor Oil, Dulcolax, Ex Lax)    o Do not take laxatives for more than 7days in a row.    IF SEVERELY CONSTIPATED, try a Bowel Retraining Program: o Do not use laxatives.  o Eat a diet high in roughage, such as bran cereals and leafy vegetables.  o Drink six (6) ounces of prune or apricot juice each morning.  o Eat two (2) large servings of stewed fruit each day.  o Take one (1) heaping tablespoon of a psyllium-based bulking agent twice a day. Use sugar-free sweetener when possible to avoid excessive calories.  o Eat a normal breakfast.  o   Set aside 15 minutes after breakfast to sit on the toilet, but do not strain to have a bowel movement.  o If you do not have a bowel movement by the third day, use an enema and repeat the above steps.    Controlling diarrhea o Switch to liquids and simpler foods for a few days to avoid stressing your intestines further. o Avoid dairy products (especially milk & ice cream) for a short time.  The intestines often can lose the ability to digest lactose when stressed. o Avoid foods that cause gassiness or bloating.  Typical foods include  beans and other legumes, cabbage, broccoli, and dairy foods.  Every person has some sensitivity to other foods, so listen to our body and avoid those foods that trigger problems for you. o Adding fiber (Citrucel, Metamucil, psyllium, Miralax) gradually can help thicken stools by absorbing excess fluid and retrain the intestines to act more normally.  Slowly increase the dose over a few weeks.  Too much fiber too soon can backfire and cause cramping & bloating. o Probiotics (such as active yogurt, Align, etc) may help repopulate the intestines and colon with normal bacteria and calm down a sensitive digestive tract.  Most studies show it to be of mild help, though, and such products can be costly. o Medicines:   Bismuth subsalicylate (ex. Kayopectate, Pepto Bismol) every 30 minutes for up to 6 doses can help control diarrhea.  Avoid if pregnant.   Loperamide (Immodium) can slow down diarrhea.  Start with two tablets (4mg total) first and then try one tablet every 6 hours.  Avoid if you are having fevers or severe pain.  If you are not better or start feeling worse, stop all medicines and call your doctor for advice o Call your doctor if you are getting worse or not better.  Sometimes further testing (cultures, endoscopy, X-ray studies, bloodwork, etc) may be needed to help diagnose and treat the cause of the diarrhea.   Managing Pain  Pain after surgery or related to activity is often due to strain/injury to muscle, tendon, nerves and/or incisions.  This pain is usually short-term and will improve in a few months.   Many people find it helpful to do the following things TOGETHER to help speed the process of healing and to get back to regular activity more quickly:  1. Avoid heavy physical activity a.  no lifting greater than 20 pounds b. Do not "push through" the pain.  Listen to your body and avoid positions and maneuvers than reproduce the pain c. Walking is okay as tolerated, but go slowly and  stop when getting sore.  d. Remember: If it hurts to do it, then don't do it! 2. Take Anti-inflammatory medication  a. Take with food/snack around the clock for 1-2 weeks i. This helps the muscle and nerve tissues become less irritable and calm down faster b. Choose ONE of the following over-the-counter medications: i. Naproxen 220mg tabs (ex. Aleve) 1-2 pills twice a day  ii. Ibuprofen 200mg tabs (ex. Advil, Motrin) 3-4 pills with every meal and just before bedtime iii. Acetaminophen 500mg tabs (Tylenol) 1-2 pills with every meal and just before bedtime 3. Use a Heating pad or Ice/Cold Pack a. 4-6 times a day b. May use warm bath/hottub  or showers 4. Try Gentle Massage and/or Stretching  a. at the area of pain many times a day b. stop if you feel pain - do not overdo it  Try these steps together to   help you body heal faster and avoid making things get worse.  Doing just one of these things may not be enough.    If you are not getting better after two weeks or are noticing you are getting worse, contact our office for further advice; we may need to re-evaluate you & see what other things we can do to help.   

## 2012-08-10 ENCOUNTER — Encounter: Payer: Self-pay | Admitting: Family Medicine

## 2012-08-10 ENCOUNTER — Ambulatory Visit (INDEPENDENT_AMBULATORY_CARE_PROVIDER_SITE_OTHER): Payer: Medicare Other | Admitting: Family Medicine

## 2012-08-10 VITALS — BP 120/61 | HR 63 | Temp 98.7°F | Ht 62.0 in | Wt 195.6 lb

## 2012-08-10 DIAGNOSIS — I10 Essential (primary) hypertension: Secondary | ICD-10-CM

## 2012-08-10 DIAGNOSIS — G8929 Other chronic pain: Secondary | ICD-10-CM

## 2012-08-10 DIAGNOSIS — Z8542 Personal history of malignant neoplasm of other parts of uterus: Secondary | ICD-10-CM

## 2012-08-10 DIAGNOSIS — E669 Obesity, unspecified: Secondary | ICD-10-CM

## 2012-08-10 DIAGNOSIS — E785 Hyperlipidemia, unspecified: Secondary | ICD-10-CM | POA: Insufficient documentation

## 2012-08-10 DIAGNOSIS — K56609 Unspecified intestinal obstruction, unspecified as to partial versus complete obstruction: Secondary | ICD-10-CM

## 2012-08-10 DIAGNOSIS — K432 Incisional hernia without obstruction or gangrene: Secondary | ICD-10-CM

## 2012-08-10 LAB — HEPATIC FUNCTION PANEL
ALT: 13 U/L (ref 0–35)
AST: 13 U/L (ref 0–37)
Albumin: 4.6 g/dL (ref 3.5–5.2)
Alkaline Phosphatase: 49 U/L (ref 39–117)
Bilirubin, Direct: 0.1 mg/dL (ref 0.0–0.3)
Indirect Bilirubin: 0.4 mg/dL (ref 0.0–0.9)
Total Bilirubin: 0.5 mg/dL (ref 0.3–1.2)
Total Protein: 7.1 g/dL (ref 6.0–8.3)

## 2012-08-10 LAB — BASIC METABOLIC PANEL WITH GFR
BUN: 11 mg/dL (ref 6–23)
CO2: 32 mEq/L (ref 19–32)
Calcium: 9.8 mg/dL (ref 8.4–10.5)
Chloride: 102 mEq/L (ref 96–112)
Creat: 0.71 mg/dL (ref 0.50–1.10)
GFR, Est African American: >89 mL/min
GFR, Est Non African American: 88 mL/min
Glucose, Bld: 99 mg/dL (ref 70–99)
Potassium: 4.2 mEq/L (ref 3.5–5.3)
Sodium: 141 mEq/L (ref 135–145)

## 2012-08-10 MED ORDER — GABAPENTIN 300 MG PO CAPS: 300.0000 mg | ORAL_CAPSULE | Freq: Every day | ORAL | Status: DC

## 2012-08-10 MED ORDER — MELOXICAM 15 MG PO TABS: 15.0000 mg | ORAL_TABLET | Freq: Every morning | ORAL | Status: DC

## 2012-08-10 MED ORDER — LISINOPRIL-HYDROCHLOROTHIAZIDE 20-12.5 MG PO TABS: 1.0000 | ORAL_TABLET | Freq: Every morning | ORAL | Status: DC

## 2012-08-10 MED ORDER — AMITRIPTYLINE HCL 25 MG PO TABS: 25.0000 mg | ORAL_TABLET | Freq: Every day | ORAL | Status: DC

## 2012-08-10 MED ORDER — SIMVASTATIN 10 MG PO TABS: 10.0000 mg | ORAL_TABLET | Freq: Every day | ORAL | Status: DC

## 2012-08-10 NOTE — Assessment & Plan Note (Signed)
Doing well,  Had hernia repair last month.

## 2012-08-10 NOTE — Assessment & Plan Note (Signed)
Postop doing well

## 2012-08-10 NOTE — Assessment & Plan Note (Signed)
Will give info in AVS on nutrition and exercise.

## 2012-08-10 NOTE — Patient Instructions (Signed)
Hypertension As your heart beats, it forces blood through your arteries. This force is your blood pressure. If the pressure is too high, it is called hypertension (HTN) or high blood pressure. HTN is dangerous because you may have it and not know it. High blood pressure may mean that your heart has to work harder to pump blood. Your arteries may be narrow or stiff. The extra work puts you at risk for heart disease, stroke, and other problems.  Blood pressure consists of two numbers, a higher number over a lower, 110/72, for example. It is stated as "110 over 72." The ideal is below 120 for the top number (systolic) and under 80 for the bottom (diastolic). Write down your blood pressure today. You should pay close attention to your blood pressure if you have certain conditions such as:  Heart failure.  Prior heart attack.  Diabetes  Chronic kidney disease.  Prior stroke.  Multiple risk factors for heart disease. To see if you have HTN, your blood pressure should be measured while you are seated with your arm held at the level of the heart. It should be measured at least twice. A one-time elevated blood pressure reading (especially in the Emergency Department) does not mean that you need treatment. There may be conditions in which the blood pressure is different between your right and left arms. It is important to see your caregiver soon for a recheck. Most people have essential hypertension which means that there is not a specific cause. This type of high blood pressure may be lowered by changing lifestyle factors such as:  Stress.  Smoking.  Lack of exercise.  Excessive weight.  Drug/tobacco/alcohol use.  Eating less salt. Most people do not have symptoms from high blood pressure until it has caused damage to the body. Effective treatment can often prevent, delay or reduce that damage. TREATMENT  When a cause has been identified, treatment for high blood pressure is directed at the  cause. There are a large number of medications to treat HTN. These fall into several categories, and your caregiver will help you select the medicines that are best for you. Medications may have side effects. You should review side effects with your caregiver. If your blood pressure stays high after you have made lifestyle changes or started on medicines,   Your medication(s) may need to be changed.  Other problems may need to be addressed.  Be certain you understand your prescriptions, and know how and when to take your medicine.  Be sure to follow up with your caregiver within the time frame advised (usually within two weeks) to have your blood pressure rechecked and to review your medications.  If you are taking more than one medicine to lower your blood pressure, make sure you know how and at what times they should be taken. Taking two medicines at the same time can result in blood pressure that is too low. SEEK IMMEDIATE MEDICAL CARE IF:  You develop a severe headache, blurred or changing vision, or confusion.  You have unusual weakness or numbness, or a faint feeling.  You have severe chest or abdominal pain, vomiting, or breathing problems. MAKE SURE YOU:   Understand these instructions.  Will watch your condition.  Will get help right away if you are not doing well or get worse. Document Released: 04/25/2005 Document Revised: 07/18/2011 Document Reviewed: 12/14/2007 [facility] Patient Information 2013 [place], [place]. Hypercholesterolemia High Blood Cholesterol Cholesterol is a white, waxy, fat-like protein needed by your body in  small amounts. The liver makes all the cholesterol you need. It is carried from the liver by the blood through the blood vessels. Deposits (plaque) may build up on blood vessel walls. This makes the arteries narrower and stiffer. Plaque increases the risk for heart attack and stroke. You cannot feel your cholesterol level even if it is very high. The  only way to know is by a blood test to check your lipid (fats) levels. Once you know your cholesterol levels, you should keep a record of the test results. Work with your caregiver to to keep your levels in the desired range. WHAT THE RESULTS MEAN:  Total cholesterol is a rough measure of all the cholesterol in your blood.  LDL is the so-called bad cholesterol. This is the type that deposits cholesterol in the walls of the arteries. You want this level to be low.  HDL is the good cholesterol because it cleans the arteries and carries the LDL away. You want this level to be high.  Triglycerides are fat that the body can either burn for energy or store. High levels are closely linked to heart disease. DESIRED LEVELS:  Total cholesterol below 200.  LDL below 100 for people at risk, below 70 for very high risk.  HDL above 50 is good, above 60 is best.  Triglycerides below 150. HOW TO LOWER YOUR CHOLESTEROL:  Diet.  Choose fish or white meat chicken and [place], roasted or baked. Limit fatty cuts of red meat, fried foods, and processed meats, such as sausage and lunch meat.  Eat lots of fresh fruits and vegetables. Choose whole grains, beans, pasta, potatoes and cereals.  Use only small amounts of olive, corn or canola oils. Avoid butter, mayonnaise, shortening or palm kernel oils. Avoid foods with trans-fats.  Use skim/nonfat milk and low-fat/nonfat yogurt and cheeses. Avoid whole milk, cream, ice cream, egg yolks and cheeses. Healthy desserts include angel food cake, gingersnaps, animal crackers, hard candy, popsicles, and low-fat/nonfat frozen yogurt. Avoid pastries, cakes, pies and cookies.  Exercise.  A regular program helps decrease LDL and raises HDL.  Helps with weight control.  Do things that increase your activity level like gardening, walking, or taking the stairs.  Medication.  May be prescribed by your caregiver to help lowering cholesterol and the risk for heart  disease.  You may need medicine even if your levels are normal if you have several risk factors. HOME CARE INSTRUCTIONS   Follow your diet and exercise programs as suggested by your caregiver.  Take medications as directed.  Have blood work done when your caregiver feels it is necessary. MAKE SURE YOU:   Understand these instructions.  Will watch your condition.  Will get help right away if you are not doing well or get worse. Document Released: 04/25/2005 Document Revised: 07/18/2011 Document Reviewed: 10/11/2006 [facility] Patient Information 2013 [place], [place].   Diet and Exercise discussed with patient. For nutrition information, I recommend books: Eat to Live by Dr Monico Hoar. Prevent and Reverse Heart Disease by Dr Suzzette Righter.  Exercise recommendations are:  If unable to walk, then the patient can exercise in a chair 3 times a day. By flapping arms like a bird gently and raising legs outwards to the front.  If ambulatory, the patient can go for walks for 30 minutes 3 times a week. Then increase the intensity and duration as tolerated. Goal is to try to attain exercise frequency to 5 times a week. Best to perform resistance exercises 2 days  a week and cardio type exercises 3 days per week.

## 2012-08-10 NOTE — Assessment & Plan Note (Signed)
Repaired last month. Doing well.

## 2012-08-10 NOTE — Assessment & Plan Note (Signed)
BP stable. Well control.

## 2012-08-10 NOTE — Progress Notes (Signed)
Patient ID: [name], female   DOB: 1945/10/14, 67 y.o.   MRN: [id number] SUBJECTIVE:   HPI: Patient is here for follow up of hypertension, hypercholesterolemia, Obesity, and chronic pain: deniesHeadache;deniesChest Pain;deniesweakness;deniesShortness of Breath or Orthopnea;deniesVisual changes;deniespalpitations;deniescough;deniespedal edema;deniessymptoms of TIA or stroke; admits toCompliance with medications. deniesProblems with medications.   PMH/PSH: reviewed/updated in Epic  SH/FH: reviewed/updated in Epic  Allergies: reviewed/updated in Epic  Medications: reviewed/updated in Epic  Immunizations: reviewed/updated in Epic  ROS: No new complaints today   OBJECTIVE:     APPEARANCE:  Short stature, obese Patient in no acute distress.The patient appeared well nourished and normally developed. Acyanotic.  Waist: not performed  VITAL SIGNS:BP 120/61  Pulse 63  Temp(Src) 98.7 F (37.1 C) (Oral)  Ht 5\' 2"  (1.575 m)  Wt 195 lb 9.6 oz (88.724 kg)  BMI 35.77 kg/m2   SKIN: warm and  Dry without overt rashes,& tattoos. Surgical  Scars of abdomen. Midline abdominal scar.  HEAD and Neck: without JVD, Normal No scleral icterus. Carotids: no bruits.  CHEST & LUNGS: Clear  CVS: Reveals the PMI to be normally located. Regular rhythm, First and Second Heart sounds are normal, and absence of murmurs, rubs or gallops.  ABDOMEN:  Benign,, no organomegaly, no masses, no Abdominal Aortic enlargement. No Guarding , no rebound. No Bruits.  RECTAL:N/A  GU:N/A  EXTREMETIES: nonedematous. Both Femoral and Pedal pulses are normal.  MUSCULOSKELETAL:  Spine: decreased ROM Ambulates with a cane and has a limp.  NEUROLOGIC: oriented to time,place and person; nonfocal.   ASSESSMENT:  History of endometrial cancer s/p TAH BSO [phone] Doing well,  Had hernia repair last month.  SBO (small bowel obstruction) - recurrent Post op doing well.  Recurrent ventral  incisional hernia s/p primary repair 07/19/2012 Repaired last month. Doing well.  Hypertension BP stable. Well control.  HLD (hyperlipidemia) Tolerating the simvastatin.  Chronic pain Stable on meloxicam  Obesity (BMI 30-39.9) Will give info in AVS on nutrition and exercise.    PLAN:  Orders Placed This Encounter  Procedures  . BASIC METABOLIC PANEL WITH GFR  . Hepatic function panel  . NMR Lipoprofile with Lipids   No results found for this or any previous visit (from the past 24 hour(s)).  Meds ordered this encounter  Medications  . amitriptyline (ELAVIL) 25 MG tablet    Sig: Take 1 tablet (25 mg total) by mouth at bedtime.    Dispense:  90 tablet    Refill:  3  . gabapentin (NEURONTIN) 300 MG capsule    Sig: Take 1 capsule (300 mg total) by mouth at bedtime.    Dispense:  90 capsule    Refill:  3  . lisinopril-hydrochlorothiazide (PRINZIDE,ZESTORETIC) 20-12.5 MG per tablet    Sig: Take 1 tablet by mouth every morning.    Dispense:  90 tablet    Refill:  3  . meloxicam (MOBIC) 15 MG tablet    Sig: Take 1 tablet (15 mg total) by mouth every morning.    Dispense:  90 tablet    Refill:  3  . simvastatin (ZOCOR) 10 MG tablet    Sig: Take 1 tablet (10 mg total) by mouth at bedtime.    Dispense:  90 tablet    Refill:  3  Diet and Exercise discussed with patient. For nutrition information, I recommend books: Eat to Live by Dr Monico Hoar. Prevent and Reverse Heart Disease by Dr Suzzette Righter.  Exercise recommendations are:  If unable to walk, then the patient  can exercise in a chair 3 times a day. By flapping arms like a bird gently and raising legs outwards to the front.  If ambulatory, the patient can go for walks for 30 minutes 3 times a week. Then increase the intensity and duration as tolerated. Goal is to try to attain exercise frequency to 5 times a week. Best to perform resistance exercises 2 days a week and cardio type exercises 3 days per  week.  RTC 3 months. Await labs.  Sanela Evola P. [place]esto Charon, M.D.

## 2012-08-10 NOTE — Assessment & Plan Note (Signed)
Stable on meloxicam  

## 2012-08-10 NOTE — Assessment & Plan Note (Signed)
Tolerating the simvastatin.

## 2012-08-13 LAB — NMR LIPOPROFILE WITH LIPIDS
Cholesterol, Total: 206 mg/dL — ABNORMAL HIGH (ref <200)
HDL Particle Number: 30 umol/L — ABNORMAL LOW (ref >=30.5)
HDL Size: 9 nm — ABNORMAL LOW (ref >=9.2)
HDL-C: 47 mg/dL (ref >=40)
LDL (calc): 120 mg/dL — ABNORMAL HIGH (ref <100)
LDL Particle Number: 1531 nmol/L — ABNORMAL HIGH (ref <1000)
LDL Size: 20.7 nm (ref >20.5)
LP-IR Score: 63 — ABNORMAL HIGH (ref <=45)
Large HDL-P: 6.4 umol/L (ref >=4.8)
Large VLDL-P: 10.2 nmol/L — ABNORMAL HIGH (ref <=2.7)
Small LDL Particle Number: 595 nmol/L — ABNORMAL HIGH (ref <=527)
Triglycerides: 195 mg/dL — ABNORMAL HIGH (ref <150)
VLDL Size: 52.1 nm — ABNORMAL HIGH (ref <=46.6)

## 2012-08-15 ENCOUNTER — Other Ambulatory Visit: Payer: Self-pay | Admitting: Family Medicine

## 2012-08-15 DIAGNOSIS — E785 Hyperlipidemia, unspecified: Secondary | ICD-10-CM

## 2012-08-15 MED ORDER — SIMVASTATIN 20 MG PO TABS: 20.0000 mg | ORAL_TABLET | Freq: Every day | ORAL | Status: DC

## 2012-08-15 NOTE — Progress Notes (Signed)
Quick Note:  Labs abnormal. Lipids are not at goal. We'll need to increase his simvastatin. We'll order in Epic. Increase simvastatin to 20 mg daily ______

## 2012-08-16 ENCOUNTER — Telehealth: Payer: Self-pay | Admitting: Family Medicine

## 2012-08-16 NOTE — Telephone Encounter (Signed)
LAB RESULTS GIVEN  

## 2012-08-16 NOTE — Progress Notes (Signed)
PT AWARE OF LAB RESULTS 

## 2012-08-29 ENCOUNTER — Encounter (INDEPENDENT_AMBULATORY_CARE_PROVIDER_SITE_OTHER): Payer: Self-pay | Admitting: Surgery

## 2012-08-29 ENCOUNTER — Ambulatory Visit (INDEPENDENT_AMBULATORY_CARE_PROVIDER_SITE_OTHER): Payer: Medicare Other | Admitting: Surgery

## 2012-08-29 VITALS — BP 132/80 | HR 78 | Resp 18 | Ht 61.0 in | Wt 198.0 lb

## 2012-08-29 DIAGNOSIS — K432 Incisional hernia without obstruction or gangrene: Secondary | ICD-10-CM

## 2012-08-29 DIAGNOSIS — K56609 Unspecified intestinal obstruction, unspecified as to partial versus complete obstruction: Secondary | ICD-10-CM

## 2012-08-29 DIAGNOSIS — K6389 Other specified diseases of intestine: Secondary | ICD-10-CM

## 2012-08-29 NOTE — Progress Notes (Signed)
Subjective:     Patient ID: [name], female   DOB: November 03, 1945, 67 y.o.   MRN: [id number]  HPI   [name]  12-04-1945 [id number]  Patient Care Team: Ernestina Penna, MD as PCP - General (Family Medicine) Bernita Buffy. Duard Brady, MD as Consulting Physician (Gynecologic Oncology) Mardella Layman, MD as Consulting Physician (Gastroenterology) Crecencio Mc, MD as Consulting Physician (Urology)  This patient is a 67 y.o.female who presents today for surgical evaluation s/p surgery 07/19/2012  POST-OPERATIVE DIAGNOSIS:  Jejunal mass / adhesion  recurrent ventral wall hernia   PROCEDURE: 07/19/2012 LAPAROSCOPIC LYSIS OF ADHESIONS  JEJUNAL SMALL BOWEL RESECTION  COLON SEROSAL REPAIR  PRIMARY VENTRAL HERNIA REPAIR  SURGEON: Surgeon(s):  Ardeth Sportsman, MD  Diagnosis Small intestine, resection SMALL BOWEL WITH SEROSAL ADHESIONS AND HEMORRHAGE, NO ATYPIA OR MALIGNANCY. RESECTION MARGINS VIABLE.  The patient comes in today with her daughter feeling better.  No nausea but no emesis.  Moving bowels well.  Using prunes & prune juice.  No fevers or chills.  Appetite better.  Energy level good  Patient Active Problem List  Diagnosis  . History of endometrial cancer s/p TAH BSO June2013  . Recurrent ventral incisional hernia s/p primary repair 07/19/2012  . SBO (small bowel obstruction) - recurrent  . Hypertension  . Obesity (BMI 30-39.9)  . Chronic pain  . Hypokalemia  . Mass of in fold of jejunum s/p SB resection 07/19/2012  . HLD (hyperlipidemia)    Past Medical History  Diagnosis Date  . Endometrioid adenocarcinoma   . Hypertension   . Obesity (BMI 30-39.9) 04/27/2012  . Arthritis   . Hyperlipidemia   . Ureteral adhesion, left 2010    s/p ureterolysis & stenting Dr. Dolores Lory now  . 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 Colon polyp, hyperplastic 04/27/2012    Colonoscopy 2010.  No adenomatous polyps   .  History of endometrial cancer s/p TAH BSO June2013 06/15/2011    Grade 1 endometrioid adenocarcinoma with focal superficial myometrial invasion of 0.1 cm with a myometrium of 1.8 cm or approximately 7%. She had a 5-cm tumor. No lymphovascular space Involvement.    . Mass of in fold of jejunum s/p SB resection 07/19/2012 07/20/2012    Past Surgical History  Procedure Laterality Date  . Abdominal hysterectomy  11/02/10    TAH, BSO, lysis of adhesions for endometrial cancer  . Incisional hernia repair  11/02/10  . Back surgery  1989, 1999, 2009    anterior L2-3, L3-4 arthrodesis  . Kidney surgery  2010    Left kidney  . Bladder suspension  2003  . Foot surgery  06/2010    left foot surgery  . Joint replacement  09/2009    Right total knee  . Total hip arthroplasty  01/2010    Right total hip  . Cataract extraction, bilateral  2003  . Total knee arthroplasty      right  . Ureterolysis  2010    lap/open left ureterolysis & omental flap, ureter stent  . Ventral hernia repair N/A 07/19/2012    Procedure: LAPAROSCOPIC LYSIS OF ADHESIONS, SMALL BOWEL RESECTION, SEROSAL REPAIR, PRIMARY VENTRAL HERNIA REPAIR;  Surgeon: Ardeth Sportsman, MD;  Location: [facility];  Service: General;  Laterality: N/A;  . Hernia repair  07/19/12    lap VWH repair    History   Social History  . Marital Status: Widowed    Spouse Name: N/A  Number of Children: N/A  . Years of Education: N/A   Occupational History  . Not on file.   Social History Main Topics  . Smoking status: Never Smoker   . Smokeless tobacco: Not on file  . Alcohol Use: No  . Drug Use: No  . Sexually Active: No   Other Topics Concern  . Not on file   Social History Narrative  . No narrative on file    Family History  Problem Relation Age of Onset  . Diabetes Mother   . Cancer Mother     lung  . Diabetes Father   . Cancer Father     liver  . Colon cancer Sister   . Diabetes Sister   . Cancer Sister     colon    Current  Outpatient Prescriptions  Medication Sig Dispense Refill  . amitriptyline (ELAVIL) 25 MG tablet Take 1 tablet (25 mg total) by mouth at bedtime.  90 tablet  3  . b complex vitamins tablet Take 1 tablet by mouth daily.      Marland Kitchen docusate sodium (COLACE) 100 MG capsule Take 100 mg by mouth at bedtime.      . gabapentin (NEURONTIN) 300 MG capsule Take 1 capsule (300 mg total) by mouth at bedtime.  90 capsule  3  . lisinopril-hydrochlorothiazide (PRINZIDE,ZESTORETIC) 20-12.5 MG per tablet Take 1 tablet by mouth every morning.  90 tablet  3  . meloxicam (MOBIC) 15 MG tablet Take 1 tablet (15 mg total) by mouth every morning.  90 tablet  3  . simvastatin (ZOCOR) 20 MG tablet Take 1 tablet (20 mg total) by mouth at bedtime.  90 tablet  3   No current facility-administered medications for this visit.     Allergies  Allergen Reactions  . Cashew Nut Oil Hives  . Zofran Hives and Nausea Only    BP 132/80  Pulse 78  Resp 18  Ht 5\' 1"  (1.549 m)  Wt 198 lb (89.812 kg)  BMI 37.43 kg/m2  Nm Renal Imaging Flow W/pharm  07/16/2012  *RADIOLOGY REPORT*  Clinical Data: Left renal stricture/ureteral stricture.  NUCLEAR MEDICINE RENAL SCINTIANGIOGRAPHY WITH FLOW AND FUNCTION AND PHARMACOLOGIC AUGMENTATION  Technique:  Radionuclide angiographic and sequential renal images were obtained after intravenous injection of radiopharmaceutical. Imaging was continued during slow intravenous injection of Lasix approximately 20-30 minutes after the start of the examination.  Radiopharmaceutical: 15.0 mCi MAG III  Comparison: Renal scan 05/29/2011  Findings:  Flow:  Prompt symmetric flow to the left and right kidney  Renogram:  Left kidney:  There is prompt cortical uptake.  Some delay in excretion into the left renal collecting system.  There is incomplete clearance of counts from the left renal pelvis following Lasix administration. Moderate postvoid residual.  Right Kidney:  Normal uptake.  Prompt excretion in the collecting  system.  Near complete clearance of counts from the collecting system following Lasix.  Differential:  Left kidney:  43.1% (previous 42.8%)  Right Kidney:  56.9% (previously 57.2%  T1/2 post Lasix:  Left kidney = 30 minutes Right kidney = 9 minutes  IMPRESSION: 1.  Mild obstructive hydronephrosis of the left kidney. 2.  Mild post  void residual in the left. 3.  Findings similar to comparison exam.   Original Report Authenticated By: Genevive Bi, M.D.      Review of Systems  Constitutional: Negative for fever, chills and diaphoresis.  HENT: Negative for ear pain, sore throat and trouble swallowing.   Eyes: Negative  for photophobia and visual disturbance.  Respiratory: Negative for cough and choking.   Cardiovascular: Negative for chest pain and palpitations.  Gastrointestinal: Positive for nausea. Negative for vomiting, abdominal pain, diarrhea, constipation, blood in stool, abdominal distention, anal bleeding and rectal pain.  Genitourinary: Negative for dysuria, frequency and difficulty urinating.  Musculoskeletal: Negative for myalgias and gait problem.  Skin: Negative for color change, pallor and rash.  Neurological: Negative for dizziness, speech difficulty, weakness and numbness.  Hematological: Negative for adenopathy.  Psychiatric/Behavioral: Negative for confusion and agitation. The patient is not nervous/anxious.        Objective:   Physical Exam  Constitutional: She is oriented to person, place, and time. She appears well-developed and well-nourished. No distress.  HENT:  Head: Normocephalic.  Mouth/Throat: Oropharynx is clear and moist. No oropharyngeal exudate.  Eyes: Conjunctivae and EOM are normal. Pupils are equal, round, and reactive to light. No scleral icterus.  Neck: Normal range of motion. No tracheal deviation present.  Cardiovascular: Normal rate and intact distal pulses.   Pulmonary/Chest: Effort normal. No respiratory distress. She exhibits no tenderness.    Abdominal: Soft. She exhibits no distension. There is no tenderness. There is no rigidity, no guarding and no CVA tenderness. No hernia. Hernia confirmed negative in the ventral area, confirmed negative in the right inguinal area and confirmed negative in the left inguinal area.    Obese but soft.  Incisions clean with normal healing ridges.  No hernias  Genitourinary: No vaginal discharge found.  Musculoskeletal: Normal range of motion. She exhibits no tenderness.  Walks w a cane  Lymphadenopathy:       Right: No inguinal adenopathy present.       Left: No inguinal adenopathy present.  Neurological: She is alert and oriented to person, place, and time. No cranial nerve deficit. She exhibits normal muscle tone. Coordination normal.  Skin: Skin is warm and dry. No rash noted. She is not diaphoretic.  Psychiatric: She has a normal mood and affect. Her behavior is normal.  Smiling, pleasant       Assessment:     5weeks out from laparoscopic lysis of adhesions and small bowel resection for chronic strictured segment of intestine with intermittent abdominal pain, recovering well  Status post primary repair of recurrent incisional hernia - no evid of recurrence    Plan:     Increase activity as tolerated to regular activity.  Do not push through pain.  Diet as tolerated. Bowel regimen to avoid problems.   High fiber diet with fiber regimen.  I did note she is at risk for recurrent incisional hernia given as only a primary repair.  We discussed signs and symptoms to watch out for such as bulging and pain.  Weight loss will help minimize recurrence.  She understands.  Return to clinic PRN.  Instructions discussed.  Followup with primary care physician for other health issues as would normally be done.  Questions answered.  The patient expressed understanding and appreciation

## 2012-08-29 NOTE — Patient Instructions (Signed)
High-Fiber Diet Fiber is found in fruits, vegetables, and grains. A high-fiber diet encourages the addition of more whole grains, legumes, fruits, and vegetables in your diet. The recommended amount of fiber for adult males is 38 g per day. For adult females, it is 25 g per day. Pregnant and lactating women should get 28 g of fiber per day. If you have a digestive or bowel problem, ask your caregiver for advice before adding high-fiber foods to your diet. Eat a variety of high-fiber foods instead of only a select few type of foods.  PURPOSE  To increase stool bulk.  To make bowel movements more regular to prevent constipation.  To lower cholesterol.  To prevent overeating. WHEN IS THIS DIET USED?  It may be used if you have constipation and hemorrhoids.  It may be used if you have uncomplicated diverticulosis (intestine condition) and irritable bowel syndrome.  It may be used if you need help with weight management.  It may be used if you want to add it to your diet as a protective measure against atherosclerosis, diabetes, and cancer. SOURCES OF FIBER  Whole-grain breads and cereals.  Fruits, such as apples, oranges, bananas, berries, prunes, and pears.  Vegetables, such as green peas, carrots, sweet potatoes, beets, broccoli, cabbage, spinach, and artichokes.  Legumes, such split peas, soy, lentils.  Almonds. FIBER CONTENT IN FOODS Starches and Grains / Dietary Fiber (g)  Cheerios, 1 cup / 3 g  Corn Flakes cereal, 1 cup / 0.7 g  Rice crispy treat cereal, 1 cup / 0.3 g  Instant oatmeal (cooked),  cup / 2 g  Frosted wheat cereal, 1 cup / 5.1 g  Brown, long-grain rice (cooked), 1 cup / 3.5 g  White, long-grain rice (cooked), 1 cup / 0.6 g  Enriched macaroni (cooked), 1 cup / 2.5 g Legumes / Dietary Fiber (g)  Baked beans (canned, plain, or vegetarian),  cup / 5.2 g  Kidney beans (canned),  cup / 6.8 g  Pinto beans (cooked),  cup / 5.5 g Breads and Crackers  / Dietary Fiber (g)  Plain or honey graham crackers, 2 squares / 0.7 g  Saltine crackers, 3 squares / 0.3 g  Plain, salted pretzels, 10 pieces / 1.8 g  Whole-wheat bread, 1 slice / 1.9 g  White bread, 1 slice / 0.7 g  Raisin bread, 1 slice / 1.2 g  Plain bagel, 3 oz / 2 g  Flour tortilla, 1 oz / 0.9 g  Corn tortilla, 1 small / 1.5 g  Hamburger or hotdog bun, 1 small / 0.9 g Fruits / Dietary Fiber (g)  Apple with skin, 1 medium / 4.4 g  Sweetened applesauce,  cup / 1.5 g  Banana,  medium / 1.5 g  Grapes, 10 grapes / 0.4 g  Orange, 1 small / 2.3 g  Raisin, 1.5 oz / 1.6 g  Melon, 1 cup / 1.4 g Vegetables / Dietary Fiber (g)  Green beans (canned),  cup / 1.3 g  Carrots (cooked),  cup / 2.3 g  Broccoli (cooked),  cup / 2.8 g  Peas (cooked),  cup / 4.4 g  Mashed potatoes,  cup / 1.6 g  Lettuce, 1 cup / 0.5 g  Corn (canned),  cup / 1.6 g  Tomato,  cup / 1.1 g Document Released: 04/25/2005 Document Revised: 10/25/2011 Document Reviewed: 07/28/2011 [facility] Patient Information 2013 [place], [place].  Exercise to Lose Weight Exercise and a healthy diet may help you lose weight. Your  doctor may suggest specific exercises. EXERCISE IDEAS AND TIPS  Choose low-cost things you enjoy doing, such as walking, bicycling, or exercising to workout videos.  Take stairs instead of the elevator.  Walk during your lunch break.  Park your car further away from work or school.  Go to a gym or an exercise class.  Start with 5 to 10 minutes of exercise each day. Build up to 30 minutes of exercise 4 to 6 days a week.  Wear shoes with good support and comfortable clothes.  Stretch before and after working out.  Work out until you breathe harder and your heart beats faster.  Drink extra water when you exercise.  Do not do so much that you hurt yourself, feel dizzy, or get very short of breath. Exercises that burn about 150 calories:  Running 1  miles in 15  minutes.  Playing volleyball for 45 to 60 minutes.  Washing and waxing a car for 45 to 60 minutes.  Playing touch football for 45 minutes.  Walking 1  miles in 35 minutes.  Pushing a stroller 1  miles in 30 minutes.  Playing basketball for 30 minutes.  Raking leaves for 30 minutes.  Bicycling 5 miles in 30 minutes.  Walking 2 miles in 30 minutes.  Dancing for 30 minutes.  Shoveling snow for 15 minutes.  Swimming laps for 20 minutes.  Walking up stairs for 15 minutes.  Bicycling 4 miles in 15 minutes.  Gardening for 30 to 45 minutes.  Jumping rope for 15 minutes.  Washing windows or floors for 45 to 60 minutes. Document Released: 05/28/2010 Document Revised: 07/18/2011 Document Reviewed: 05/28/2010 [facility] Patient Information 2013 [place], [place].

## 2012-10-04 ENCOUNTER — Telehealth: Payer: Self-pay | Admitting: Family Medicine

## 2012-10-04 NOTE — Telephone Encounter (Signed)
Pt stated she had a mammogram 2 wks ago at the [facility]  Advised pt to call them to see if she needs to sch another mammogram as we do not have the last mammogram in her records. Requested for pt to have them send [place] a copy of recent mammogram.

## 2012-10-04 NOTE — Telephone Encounter (Signed)
Spoke with pt she stated received letter from [facility] center saying it was time to "redo mammogram"  Informed pt she could call the number they provided and sch appt. Pt verbalized understanding and to call us prn.

## 2012-10-05 ENCOUNTER — Telehealth: Payer: Self-pay | Admitting: Family Medicine

## 2012-10-08 ENCOUNTER — Telehealth: Payer: Self-pay | Admitting: Family Medicine

## 2012-10-08 DIAGNOSIS — R928 Other abnormal and inconclusive findings on diagnostic imaging of breast: Secondary | ICD-10-CM

## 2012-10-08 NOTE — Telephone Encounter (Signed)
Spoke with pt --she wants to do the additional imaging at the breast center in [place]  Does not want to drive to [place].   See mammogram 10-05-12

## 2012-10-08 NOTE — Telephone Encounter (Signed)
Spoke with pt and according to last mammogram performed on 10-05-12 letter sent to pt and additional imaging recommended. Advised pt to notify the "[facility]" . Pt verbalized understanding.

## 2012-10-11 ENCOUNTER — Other Ambulatory Visit: Payer: Self-pay | Admitting: Family Medicine

## 2012-10-11 ENCOUNTER — Telehealth: Payer: Self-pay | Admitting: Family Medicine

## 2012-10-11 DIAGNOSIS — R922 Inconclusive mammogram: Secondary | ICD-10-CM

## 2012-10-11 DIAGNOSIS — N63 Unspecified lump in unspecified breast: Secondary | ICD-10-CM

## 2012-10-16 NOTE — Telephone Encounter (Signed)
Error

## 2012-10-25 ENCOUNTER — Ambulatory Visit
Admission: RE | Admit: 2012-10-25 | Discharge: 2012-10-25 | Disposition: A | Discharge status: Home / Self Care | Payer: Medicare Other | Source: Ambulatory Visit | Attending: Family Medicine | Admitting: Family Medicine

## 2012-10-25 ENCOUNTER — Other Ambulatory Visit: Payer: Self-pay | Admitting: Neurological Surgery

## 2012-10-25 ENCOUNTER — Other Ambulatory Visit (HOSPITAL_COMMUNITY): Payer: Self-pay | Admitting: Neurological Surgery

## 2012-10-25 DIAGNOSIS — R922 Inconclusive mammogram: Secondary | ICD-10-CM

## 2012-10-25 DIAGNOSIS — N63 Unspecified lump in unspecified breast: Secondary | ICD-10-CM

## 2012-10-25 DIAGNOSIS — M48061 Spinal stenosis, lumbar region without neurogenic claudication: Secondary | ICD-10-CM

## 2012-10-26 ENCOUNTER — Encounter (HOSPITAL_COMMUNITY): Payer: Self-pay | Admitting: Pharmacy Technician

## 2012-10-31 ENCOUNTER — Ambulatory Visit (HOSPITAL_COMMUNITY)
Admission: RE | Admit: 2012-10-31 | Discharge: 2012-10-31 | Disposition: A | Discharge status: Home / Self Care | Payer: Medicare Other | Source: Ambulatory Visit | Attending: Neurological Surgery | Admitting: Neurological Surgery

## 2012-10-31 VITALS — BP 141/72 | HR 57 | Temp 98.1°F | Resp 20 | Ht 61.0 in | Wt 200.0 lb

## 2012-10-31 DIAGNOSIS — I700 Atherosclerosis of aorta: Secondary | ICD-10-CM | POA: Insufficient documentation

## 2012-10-31 DIAGNOSIS — Z981 Arthrodesis status: Secondary | ICD-10-CM | POA: Insufficient documentation

## 2012-10-31 DIAGNOSIS — M48061 Spinal stenosis, lumbar region without neurogenic claudication: Secondary | ICD-10-CM

## 2012-10-31 DIAGNOSIS — M5126 Other intervertebral disc displacement, lumbar region: Secondary | ICD-10-CM | POA: Insufficient documentation

## 2012-10-31 MED ORDER — DIAZEPAM 5 MG PO TABS
ORAL_TABLET | ORAL | Status: AC
  Filled 2012-10-31: qty 2

## 2012-10-31 MED ORDER — DIAZEPAM 5 MG PO TABS
10.0000 mg | ORAL_TABLET | Freq: Once | ORAL | Status: AC
  Administered 2012-10-31: 10 mg via ORAL

## 2012-10-31 MED ORDER — IOHEXOL 180 MG/ML  SOLN
20.0000 mL | Freq: Once | INTRAMUSCULAR | Status: AC | PRN
  Administered 2012-10-31: 20 mL via INTRATHECAL

## 2012-10-31 MED ORDER — ONDANSETRON HCL 4 MG/2ML IJ SOLN: 4.0000 mg | Freq: Four times a day (QID) | INTRAMUSCULAR | Status: DC | PRN

## 2012-10-31 MED ORDER — DIAZEPAM 5 MG PO TABS: 5.0000 mg | ORAL_TABLET | Freq: Once | ORAL | Status: DC

## 2012-10-31 MED ORDER — HYDROCODONE-ACETAMINOPHEN 5-325 MG PO TABS: 1.0000 | ORAL_TABLET | ORAL | Status: DC | PRN

## 2012-10-31 NOTE — Procedures (Signed)
Mrs. [name] is a 67 year old individual who a number of years ago underwent a two-level anterior lumbar interbody arthrodesis. She does have further spondylosis developed in her spine and now has symptoms of back pain and neurogenic claudication. A new myelogram is being performed to see if she has stenosis that is worsening and in need of surgical relief.  Pre op Dx: Lumbar spondylosis and stenosis status post anterior lumbar interbody arthrodesis L3-4 L4-5 Post op Dx: Lumbar spondylosis and stenosis status post anterior lumbar interbody arthrodesis L3-4 L4-5 Procedure: Lumbar myelogram Surgeon: Juliene Kirsh Puncture level: L3-L4 Fluid color: Clear colorless Injection: 15 cc iohexol 180 Findings: Diffuse spondylosis with mild stenosis above and below previous arthrodesis.

## 2012-11-02 ENCOUNTER — Emergency Department (HOSPITAL_COMMUNITY): Payer: Medicare Other

## 2012-11-02 ENCOUNTER — Encounter (HOSPITAL_COMMUNITY): Payer: Self-pay | Admitting: *Deleted

## 2012-11-02 ENCOUNTER — Emergency Department (HOSPITAL_COMMUNITY)
Admission: EM | Admit: 2012-11-02 | Discharge: 2012-11-02 | Disposition: A | Discharge status: Home / Self Care | Payer: Medicare Other | Attending: Emergency Medicine | Admitting: Emergency Medicine

## 2012-11-02 DIAGNOSIS — Z87440 Personal history of urinary (tract) infections: Secondary | ICD-10-CM | POA: Insufficient documentation

## 2012-11-02 DIAGNOSIS — K1379 Other lesions of oral mucosa: Secondary | ICD-10-CM

## 2012-11-02 DIAGNOSIS — R300 Dysuria: Secondary | ICD-10-CM | POA: Insufficient documentation

## 2012-11-02 DIAGNOSIS — E669 Obesity, unspecified: Secondary | ICD-10-CM | POA: Insufficient documentation

## 2012-11-02 DIAGNOSIS — Z8739 Personal history of other diseases of the musculoskeletal system and connective tissue: Secondary | ICD-10-CM | POA: Insufficient documentation

## 2012-11-02 DIAGNOSIS — R109 Unspecified abdominal pain: Secondary | ICD-10-CM

## 2012-11-02 DIAGNOSIS — Z862 Personal history of diseases of the blood and blood-forming organs and certain disorders involving the immune mechanism: Secondary | ICD-10-CM | POA: Insufficient documentation

## 2012-11-02 DIAGNOSIS — I10 Essential (primary) hypertension: Secondary | ICD-10-CM | POA: Insufficient documentation

## 2012-11-02 DIAGNOSIS — Z8639 Personal history of other endocrine, nutritional and metabolic disease: Secondary | ICD-10-CM | POA: Insufficient documentation

## 2012-11-02 DIAGNOSIS — K1370 Unspecified lesions of oral mucosa: Secondary | ICD-10-CM | POA: Insufficient documentation

## 2012-11-02 DIAGNOSIS — Z8742 Personal history of other diseases of the female genital tract: Secondary | ICD-10-CM | POA: Insufficient documentation

## 2012-11-02 DIAGNOSIS — Z8542 Personal history of malignant neoplasm of other parts of uterus: Secondary | ICD-10-CM | POA: Insufficient documentation

## 2012-11-02 DIAGNOSIS — Z85038 Personal history of other malignant neoplasm of large intestine: Secondary | ICD-10-CM | POA: Insufficient documentation

## 2012-11-02 DIAGNOSIS — Z8719 Personal history of other diseases of the digestive system: Secondary | ICD-10-CM | POA: Insufficient documentation

## 2012-11-02 DIAGNOSIS — Z860100 Personal history of colon polyps, unspecified: Secondary | ICD-10-CM | POA: Insufficient documentation

## 2012-11-02 DIAGNOSIS — Z90710 Acquired absence of both cervix and uterus: Secondary | ICD-10-CM | POA: Insufficient documentation

## 2012-11-02 LAB — URINALYSIS, ROUTINE W REFLEX MICROSCOPIC
Bilirubin Urine: NEGATIVE
Glucose, UA: NEGATIVE mg/dL
Hgb urine dipstick: NEGATIVE
Ketones, ur: NEGATIVE mg/dL
Leukocytes, UA: NEGATIVE
Nitrite: NEGATIVE
Protein, ur: NEGATIVE mg/dL
Specific Gravity, Urine: 1.01 (ref 1.005–1.030)
Urobilinogen, UA: 0.2 mg/dL (ref 0.0–1.0)
pH: 6 (ref 5.0–8.0)

## 2012-11-02 LAB — CBC WITH DIFFERENTIAL/PLATELET
Basophils Absolute: 0 10*3/uL (ref 0.0–0.1)
Basophils Relative: 0 % (ref 0–1)
Eosinophils Absolute: 0.1 10*3/uL (ref 0.0–0.7)
Eosinophils Relative: 1 % (ref 0–5)
HCT: 39.3 % (ref 36.0–46.0)
Hemoglobin: 13.5 g/dL (ref 12.0–15.0)
Lymphocytes Relative: 33 % (ref 12–46)
Lymphs Abs: 3.1 10*3/uL (ref 0.7–4.0)
MCH: 31.6 pg (ref 26.0–34.0)
MCHC: 34.4 g/dL (ref 30.0–36.0)
MCV: 92 fL (ref 78.0–100.0)
Monocytes Absolute: 0.8 10*3/uL (ref 0.1–1.0)
Monocytes Relative: 8 % (ref 3–12)
Neutro Abs: 5.5 10*3/uL (ref 1.7–7.7)
Neutrophils Relative %: 58 % (ref 43–77)
Platelets: 368 10*3/uL (ref 150–400)
RBC: 4.27 MIL/uL (ref 3.87–5.11)
RDW: 13.4 % (ref 11.5–15.5)
WBC: 9.5 10*3/uL (ref 4.0–10.5)

## 2012-11-02 LAB — COMPREHENSIVE METABOLIC PANEL
ALT: 63 U/L — ABNORMAL HIGH (ref 0–35)
AST: 15 U/L (ref 0–37)
Albumin: 3.3 g/dL — ABNORMAL LOW (ref 3.5–5.2)
Alkaline Phosphatase: 174 U/L — ABNORMAL HIGH (ref 39–117)
BUN: 11 mg/dL (ref 6–23)
CO2: 28 mEq/L (ref 19–32)
Calcium: 9.7 mg/dL (ref 8.4–10.5)
Chloride: 101 mEq/L (ref 96–112)
Creatinine, Ser: 0.83 mg/dL (ref 0.50–1.10)
GFR calc Af Amer: 83 mL/min — ABNORMAL LOW (ref >90)
GFR calc non Af Amer: 71 mL/min — ABNORMAL LOW (ref >90)
Glucose, Bld: 91 mg/dL (ref 70–99)
Potassium: 3.8 mEq/L (ref 3.5–5.1)
Sodium: 138 mEq/L (ref 135–145)
Total Bilirubin: 0.4 mg/dL (ref 0.3–1.2)
Total Protein: 7.3 g/dL (ref 6.0–8.3)

## 2012-11-02 LAB — LIPASE, BLOOD: Lipase: 59 U/L (ref 11–59)

## 2012-11-02 MED ORDER — LIDOCAINE VISCOUS 2 % MT SOLN
10.0000 mL | Freq: Once | OROMUCOSAL | Status: AC
  Administered 2012-11-02: 10 mL via OROMUCOSAL
  Filled 2012-11-02: qty 10

## 2012-11-02 MED ORDER — IOHEXOL 300 MG/ML  SOLN
125.0000 mL | Freq: Once | INTRAMUSCULAR | Status: AC | PRN
  Administered 2012-11-02: 125 mL via INTRAVENOUS

## 2012-11-02 MED ORDER — SODIUM CHLORIDE 0.9 % IV BOLUS (SEPSIS)
1000.0000 mL | Freq: Once | INTRAVENOUS | Status: AC
  Administered 2012-11-02: 1000 mL via INTRAVENOUS

## 2012-11-02 MED ORDER — MORPHINE SULFATE 4 MG/ML IJ SOLN
4.0000 mg | Freq: Once | INTRAMUSCULAR | Status: AC
  Administered 2012-11-02: 4 mg via INTRAVENOUS
  Filled 2012-11-02: qty 1

## 2012-11-02 MED ORDER — IOHEXOL 300 MG/ML  SOLN
50.0000 mL | Freq: Once | INTRAMUSCULAR | Status: AC | PRN
  Administered 2012-11-02: 50 mL via ORAL

## 2012-11-02 MED ORDER — TRAMADOL HCL 50 MG PO TABS: 50.0000 mg | ORAL_TABLET | Freq: Four times a day (QID) | ORAL | Status: DC | PRN

## 2012-11-02 NOTE — ED Notes (Signed)
Three people have attempted to gain IV access on pt and have all been unsuccessful. MD notifed IV team has been paged.

## 2012-11-02 NOTE — ED Provider Notes (Signed)
I was asked to place US guided IV. Procedure note below.   Angiocath insertion Performed by: Chaney Malling  Consent: Verbal consent obtained. Risks and benefits: risks, benefits and alternatives were discussed Time out: Immediately prior to procedure a "time out" was called to verify the correct patient, procedure, equipment, support staff and site/side marked as required.  Preparation: Patient was prepped and draped in the usual sterile fashion.  Vein Location: L brachial  Ultrasound Guided  Gauge: 20 gauge long angiocath   Normal blood return and flush without difficulty Patient tolerance: Patient tolerated the procedure well with no immediate complications.     Richardean Canal, MD 11/02/12 1145

## 2012-11-02 NOTE — ED Provider Notes (Signed)
History    67 year old female with left-sided abdominal pain. Gradual onset about a week ago. This was associated with some dysuria. She was prescribed a course of Bactrim for reported urinary tract infection. She finished a three-day course on Monday.Two days after she started, she noticed some sores in her mouth. She's had several new sores on her face and mouth since then. Very painful and will occasionally bleed. No other new exposures aside from the trimethoprim-sulfamethoxazole. She does not think she's ever been on this medication before. No fevers or chills. The sores are painful prohibiting her from wearing her dentures. She reports being able to eat and drink adequately though. Her urinary symptoms have resolved, but she states that her abdominal pain has persisted without any appreciable change.  CSN: [id number] Arrival date & time 11/02/12  0808  First MD Initiated Contact with Patient 11/02/12 [phone]     Chief Complaint  Patient presents with  . Abdominal Pain  . Flank Pain   (Consider location/radiation/quality/duration/timing/severity/associated sxs/prior Treatment) HPI Past Medical History  Diagnosis Date  . Endometrioid adenocarcinoma   . Hypertension   . Obesity (BMI 30-39.9) 04/27/2012  . Arthritis   . Hyperlipidemia   . Ureteral adhesion, left 2010    s/p ureterolysis & stenting Dr. Dolores Lory now  . 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 Colon polyp, hyperplastic 04/27/2012    Colonoscopy 2010.  No adenomatous polyps   . History of endometrial cancer s/p TAH BSO June2013 06/15/2011    Grade 1 endometrioid adenocarcinoma with focal superficial myometrial invasion of 0.1 cm with a myometrium of 1.8 cm or approximately 7%. She had a 5-cm tumor. No lymphovascular space Involvement.    . Mass of in fold of jejunum s/p SB resection 07/19/2012 07/20/2012   Past Surgical History  Procedure Laterality Date  .  Abdominal hysterectomy  11/02/10    TAH, BSO, lysis of adhesions for endometrial cancer  . Incisional hernia repair  11/02/10  . Back surgery  1989, 1999, 2009    anterior L2-3, L3-4 arthrodesis  . Kidney surgery  2010    Left kidney  . Bladder suspension  2003  . Foot surgery  06/2010    left foot surgery  . Joint replacement  09/2009    Right total knee  . Total hip arthroplasty  01/2010    Right total hip  . Cataract extraction, bilateral  2003  . Total knee arthroplasty      right  . Ureterolysis  2010    lap/open left ureterolysis & omental flap, ureter stent  . Ventral hernia repair N/A 07/19/2012    Procedure: LAPAROSCOPIC LYSIS OF ADHESIONS, SMALL BOWEL RESECTION, SEROSAL REPAIR, PRIMARY VENTRAL HERNIA REPAIR;  Surgeon: Ardeth Sportsman, MD;  Location: [facility];  Service: General;  Laterality: N/A;  . Hernia repair  07/19/12    lap VWH repair   Family History  Problem Relation Age of Onset  . Diabetes Mother   . Cancer Mother     lung  . Diabetes Father   . Cancer Father     liver  . Colon cancer Sister   . Diabetes Sister   . Cancer Sister     colon   History  Substance Use Topics  . Smoking status: Never Smoker   . Smokeless tobacco: Not on file  . Alcohol Use: No   OB History   Grav Para Term Preterm Abortions TAB SAB Ect Mult Living  Review of Systems  All systems reviewed and negative, other than as noted in HPI.   Allergies  Cashew nut oil; Sulfamethoxazole; and Zofran  Home Medications   Current Outpatient Rx  Name  Route  Sig  Dispense  Refill  . amitriptyline (ELAVIL) 25 MG tablet   Oral   Take 1 tablet (25 mg total) by mouth at bedtime.   90 tablet   3   . docusate sodium (COLACE) 100 MG capsule   Oral   Take 100 mg by mouth at bedtime.         . gabapentin (NEURONTIN) 300 MG capsule   Oral   Take 1 capsule (300 mg total) by mouth at bedtime.   90 capsule   3   . lisinopril-hydrochlorothiazide (PRINZIDE,ZESTORETIC)  20-12.5 MG per tablet   Oral   Take 1 tablet by mouth every morning.   90 tablet   3   . meloxicam (MOBIC) 15 MG tablet   Oral   Take 1 tablet (15 mg total) by mouth every morning.   90 tablet   3   . simvastatin (ZOCOR) 20 MG tablet   Oral   Take 1 tablet (20 mg total) by mouth at bedtime.   90 tablet   3    BP 150/79  Pulse 66  Temp(Src) 97.6 F (36.4 C) (Oral)  Resp 16  SpO2 100% Physical Exam  Nursing note and vitals reviewed. Constitutional: She appears well-developed and well-nourished. No distress.  HENT:  Head: Normocephalic.  Mouth/Throat: No oropharyngeal exudate.  Several sores of differing morphology to face/mouth. Nonspecific cracked/scabbed lesions on upper and lower lips. Small areas of denudation mucosal surface of lower lip, along mandibular/maxillary ridges and anterior hard palate. Minimal bleeding noted from some areas. Erythematous macule to lower chin as swell. Posterior pharynx clear. Handling secretions.   Eyes: Conjunctivae are normal. Pupils are equal, round, and reactive to light. Right eye exhibits no discharge. Left eye exhibits no discharge.  Neck: Normal range of motion. Neck supple.  Cardiovascular: Normal rate, regular rhythm and normal heart sounds.  Exam reveals no gallop and no friction rub.   No murmur heard. Pulmonary/Chest: Effort normal and breath sounds normal. No stridor. No respiratory distress.  Abdominal: Soft. She exhibits no distension. There is tenderness.  Mild left-sided abdominal tenderness without rebound or guarding. No distention.  Genitourinary:  No costovertebral angle tenderness.  Musculoskeletal: She exhibits no edema and no tenderness.  Lymphadenopathy:    She has no cervical adenopathy.  Neurological: She is alert.  Skin: Skin is warm and dry.  Skin lesions as noted on HENT. No other rash noted, specifically palms/soles or ocular involvement.   Psychiatric: She has a normal mood and affect. Her behavior is  normal. Thought content normal.    ED Course  Procedures (including critical care time) Labs Reviewed  COMPREHENSIVE METABOLIC PANEL - Abnormal; Notable for the following:    Albumin 3.3 (*)    ALT 63 (*)    Alkaline Phosphatase 174 (*)    GFR calc non Af Amer 71 (*)    GFR calc Af Amer 83 (*)    All other components within normal limits  CBC WITH DIFFERENTIAL  URINALYSIS, ROUTINE W REFLEX MICROSCOPIC  LIPASE, BLOOD   Dg Chest 2 View  11/02/2012   *RADIOLOGY REPORT*  Clinical Data: Lower chest pain  CHEST - 2 VIEW  Comparison: April 27, 2012.  Findings: Cardiomediastinal silhouette appears normal.  No acute pulmonary disease is noted.  Bony thorax is intact.  IMPRESSION: No acute cardiopulmonary abnormality seen.   Original Report Authenticated By: Lupita Raider.,  M.D.   1. Left sided abdominal pain   2. Mouth sores     MDM  67 year old female with oral and facial sores. Concerning for possible Stevens-Johnson syndrome. Clinically relatively mild. Luckily only a three-day course of Bactrim. She has been off this medication for several days now as well. Although symptoms concerning, only scattered lesions noted. Clinically well hydrated. Not wearing dentures because of location of some of the sores, but still seems to be obtaining adequate nutrition at this time.  Will further w/u abdominal pain.   CT w/ no acute abnormality. W/u fairly unremarkable. Plan symptomatic tx at this time. Pt given strict return precautions in terms of both abdominal pain and also oral/facial sores. Outpt FU.  Raeford Razor, MD 11/03/12 1121

## 2012-11-02 NOTE — ED Notes (Signed)
IV team at bedside 

## 2012-11-02 NOTE — ED Notes (Signed)
Pt states that she has been having left sided pain and went to see Dr.Bordon her urologist her prescribed her SMZ/TMP 800-160 for a kidney infection. After taking the medication for two days she started to develop blisters and sores in her mouth and around the outside of her mouth and lips. She is still experiencing the ABD pain at this time. She has not taken anything else at home to try to relieve the pain. Alert and oriented x4, VSS ambulatory, NAD.

## 2012-11-02 NOTE — ED Notes (Signed)
Patient transported to CT 

## 2012-11-13 ENCOUNTER — Encounter: Payer: Self-pay | Admitting: Family Medicine

## 2012-11-13 ENCOUNTER — Ambulatory Visit (INDEPENDENT_AMBULATORY_CARE_PROVIDER_SITE_OTHER): Payer: Medicare Other | Admitting: Family Medicine

## 2012-11-13 VITALS — BP 133/71 | HR 67 | Temp 97.7°F | Wt 196.2 lb

## 2012-11-13 DIAGNOSIS — I10 Essential (primary) hypertension: Secondary | ICD-10-CM

## 2012-11-13 DIAGNOSIS — G8929 Other chronic pain: Secondary | ICD-10-CM

## 2012-11-13 DIAGNOSIS — R635 Abnormal weight gain: Secondary | ICD-10-CM

## 2012-11-13 DIAGNOSIS — E669 Obesity, unspecified: Secondary | ICD-10-CM

## 2012-11-13 DIAGNOSIS — Z8542 Personal history of malignant neoplasm of other parts of uterus: Secondary | ICD-10-CM

## 2012-11-13 DIAGNOSIS — E785 Hyperlipidemia, unspecified: Secondary | ICD-10-CM

## 2012-11-13 DIAGNOSIS — G4762 Sleep related leg cramps: Secondary | ICD-10-CM | POA: Insufficient documentation

## 2012-11-13 LAB — COMPLETE METABOLIC PANEL WITH GFR
ALT: 16 U/L (ref 0–35)
AST: 13 U/L (ref 0–37)
Albumin: 3.9 g/dL (ref 3.5–5.2)
Alkaline Phosphatase: 68 U/L (ref 39–117)
BUN: 12 mg/dL (ref 6–23)
CO2: 30 mEq/L (ref 19–32)
Calcium: 9.6 mg/dL (ref 8.4–10.5)
Chloride: 101 mEq/L (ref 96–112)
Creat: 0.73 mg/dL (ref 0.50–1.10)
GFR, Est African American: >89 mL/min
GFR, Est Non African American: 85 mL/min
Glucose, Bld: 106 mg/dL — ABNORMAL HIGH (ref 70–99)
Potassium: 5 mEq/L (ref 3.5–5.3)
Sodium: 138 mEq/L (ref 135–145)
Total Bilirubin: 0.6 mg/dL (ref 0.3–1.2)
Total Protein: 6.3 g/dL (ref 6.0–8.3)

## 2012-11-13 NOTE — Patient Instructions (Addendum)
      Dr Crestina Strike's Recommendations  Diet and Exercise discussed with patient.  For nutrition information, I recommend books:  1).Eat to Live by Dr Joel Fuhrman. 2).Prevent and Reverse Heart Disease by Dr Caldwell Esselstyn. 3) Dr Neal Barnard's Book:  Program to Reverse Diabetes  Exercise recommendations are:  If unable to walk, then the patient can exercise in a chair 3 times a day. By flapping arms like a bird gently and raising legs outwards to the front.  If ambulatory, the patient can go for walks for 30 minutes 3 times a week. Then increase the intensity and duration as tolerated.  Goal is to try to attain exercise frequency to 5 times a week.  If applicable: Best to perform resistance exercises (machines or weights) 2 days a week and cardio type exercises 3 days per week.  

## 2012-11-13 NOTE — Progress Notes (Signed)
Patient ID: [name], female   DOB: 1946-03-07, 67 y.o.   MRN: [id number] SUBJECTIVE: CC: Chief Complaint  Patient presents with  . Follow-up    3 month follow up  refill simvstatin 90 w/refills feet and legs cramping    HPI: Patient is here for follow up of hyperlipidemia/htn/obesity: denies Headache;denies Chest Pain;denies weakness;denies Shortness of Breath and orthopnea;denies Visual changes;denies palpitations;denies cough;denies pedal edema;denies symptoms of TIA or stroke;deniesClaudication symptoms. admits to Compliance with medications; denies Problems with medications.  Having nocturnal cramps and has to get up and then it goes away.  Past Medical History  Diagnosis Date  . Endometrioid adenocarcinoma   . Hypertension   . Obesity (BMI 30-39.9) 04/27/2012  . Arthritis   . Hyperlipidemia   . Ureteral adhesion, left 2010    s/p ureterolysis & stenting Dr. Dolores Lory now  . 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 Colon polyp, hyperplastic 04/27/2012    Colonoscopy 2010.  No adenomatous polyps   . History of endometrial cancer s/p TAH BSO June2013 06/15/2011    Grade 1 endometrioid adenocarcinoma with focal superficial myometrial invasion of 0.1 cm with a myometrium of 1.8 cm or approximately 7%. She had a 5-cm tumor. No lymphovascular space Involvement.    . Mass of in fold of jejunum s/p SB resection 07/19/2012 07/20/2012   Past Surgical History  Procedure Laterality Date  . Abdominal hysterectomy  11/02/10    TAH, BSO, lysis of adhesions for endometrial cancer  . Incisional hernia repair  11/02/10  . Back surgery  1989, 1999, 2009    anterior L2-3, L3-4 arthrodesis  . Kidney surgery  2010    Left kidney  . Bladder suspension  2003  . Foot surgery  06/2010    left foot surgery  . Joint replacement  09/2009    Right total knee  . Total hip arthroplasty  01/2010    Right total hip  . Cataract extraction,  bilateral  2003  . Total knee arthroplasty      right  . Ureterolysis  2010    lap/open left ureterolysis & omental flap, ureter stent  . Ventral hernia repair N/A 07/19/2012    Procedure: LAPAROSCOPIC LYSIS OF ADHESIONS, SMALL BOWEL RESECTION, SEROSAL REPAIR, PRIMARY VENTRAL HERNIA REPAIR;  Surgeon: Ardeth Sportsman, MD;  Location: [facility];  Service: General;  Laterality: N/A;  . Hernia repair  07/19/12    lap VWH repair   History   Social History  . Marital Status: Widowed    Spouse Name: N/A    Number of Children: N/A  . Years of Education: N/A   Occupational History  . Not on file.   Social History Main Topics  . Smoking status: Never Smoker   . Smokeless tobacco: Not on file  . Alcohol Use: No  . Drug Use: No  . Sexually Active: No   Other Topics Concern  . Not on file   Social History Narrative  . No narrative on file   Family History  Problem Relation Age of Onset  . Diabetes Mother   . Cancer Mother     lung  . Diabetes Father   . Cancer Father     liver  . Colon cancer Sister   . Diabetes Sister   . Cancer Sister     colon   Current Outpatient Prescriptions on File Prior to Visit  Medication Sig Dispense Refill  . amitriptyline (ELAVIL) 25 MG tablet  Take 1 tablet (25 mg total) by mouth at bedtime.  90 tablet  3  . docusate sodium (COLACE) 100 MG capsule Take 100 mg by mouth at bedtime.      . gabapentin (NEURONTIN) 300 MG capsule Take 1 capsule (300 mg total) by mouth at bedtime.  90 capsule  3  . lisinopril-hydrochlorothiazide (PRINZIDE,ZESTORETIC) 20-12.5 MG per tablet Take 1 tablet by mouth every morning.  90 tablet  3  . meloxicam (MOBIC) 15 MG tablet Take 1 tablet (15 mg total) by mouth every morning.  90 tablet  3  . simvastatin (ZOCOR) 20 MG tablet Take 1 tablet (20 mg total) by mouth at bedtime.  90 tablet  3  . traMADol (ULTRAM) 50 MG tablet Take 1 tablet (50 mg total) by mouth every 6 (six) hours as needed for pain.  15 tablet  0   No current  facility-administered medications on file prior to visit.   Allergies  Allergen Reactions  . Cashew Nut Oil Hives  . Sulfamethoxazole     Mouth sores  . Zofran Hives and Nausea Only   Immunization History  Administered Date(s) Administered  . Pneumococcal Polysaccharide 05/09/2010  . Tdap 03/09/2012  . Zoster 05/09/2010   Prior to Admission medications   Medication Sig Start Date End Date Taking? Authorizing Provider  amitriptyline (ELAVIL) 25 MG tablet Take 1 tablet (25 mg total) by mouth at bedtime. 08/10/12  Yes Ileana Ladd, MD  docusate sodium (COLACE) 100 MG capsule Take 100 mg by mouth at bedtime.   Yes Historical Provider, MD  fish oil-omega-3 fatty acids 1000 MG capsule Take 1 g by mouth daily.   Yes Historical Provider, MD  gabapentin (NEURONTIN) 300 MG capsule Take 1 capsule (300 mg total) by mouth at bedtime. 08/10/12  Yes Ileana Ladd, MD  lisinopril-hydrochlorothiazide (PRINZIDE,ZESTORETIC) 20-12.5 MG per tablet Take 1 tablet by mouth every morning. 08/10/12  Yes Ileana Ladd, MD  meloxicam (MOBIC) 15 MG tablet Take 1 tablet (15 mg total) by mouth every morning. 08/10/12  Yes Ileana Ladd, MD  simvastatin (ZOCOR) 20 MG tablet Take 1 tablet (20 mg total) by mouth at bedtime. 08/15/12  Yes Ileana Ladd, MD  traMADol (ULTRAM) 50 MG tablet Take 1 tablet (50 mg total) by mouth every 6 (six) hours as needed for pain. 11/02/12   Raeford Razor, MD     ROS: As above in the HPI. All other systems are stable or negative.  OBJECTIVE: APPEARANCE:  Patient in no acute distress.The patient appeared well nourished and normally developed. Acyanotic. Waist: VITAL SIGNS:BP 133/71  Pulse 67  Temp(Src) 97.7 F (36.5 C) (Oral)  Wt 196 lb 3.2 oz (88.996 kg)  BMI 37.09 kg/m2 WF obese  SKIN: warm and  Dry without overt rashes, tattoos and scars  HEAD and Neck: without JVD, Head and scalp: normal Eyes:No scleral icterus. Fundi normal, eye movements normal. Ears: Auricle normal,  canal normal, Tympanic membranes normal, insufflation normal. Nose: normal Throat: normal Neck & thyroid: normal  CHEST & LUNGS: Chest wall: normal Lungs: Clear  CVS: Reveals the PMI to be normally located. Regular rhythm, First and Second Heart sounds are normal,  absence of murmurs, rubs or gallops. Peripheral vasculature: Radial pulses: normal Dorsal pedis pulses: normal Posterior pulses: normal  ABDOMEN:  Appearance: Obese Benign, no organomegaly, no masses, no Abdominal Aortic enlargement. No Guarding , no rebound. No Bruits. Bowel sounds: normal  RECTAL: N/A GU: N/A  EXTREMETIES: nonedematous. Pulses 1+ in DP.  NEUROLOGIC: oriented to time,place and person; nonfocal. Strength is normal Sensory is normal  ASSESSMENT:  Obesity (BMI 30-39.9)  Hypertension - Plan: COMPLETE METABOLIC PANEL WITH GFR  HLD (hyperlipidemia) - Plan: COMPLETE METABOLIC PANEL WITH GFR, NMR Lipoprofile with Lipids  History of endometrial cancer s/p TAH BSO [id number]  Chronic pain  Nocturnal leg cramps   PLAN: Trial of mustard in the  Evening to see if this will alleviate the cramps.  Orders Placed This Encounter  Procedures  . COMPLETE METABOLIC PANEL WITH GFR  . NMR Lipoprofile with Lipids   Meds ordered this encounter  Medications  . fish oil-omega-3 fatty acids 1000 MG capsule    Sig: Take 1 g by mouth daily.        Dr Woodroe Mode Recommendations  Diet and Exercise discussed with patient.  For nutrition information, I recommend books:  1).Eat to Live by Dr Monico Hoar. 2).Prevent and Reverse Heart Disease by Dr Suzzette Righter. 3) Dr Katherina Right Book:  Program to Reverse Diabetes  Exercise recommendations are:  If unable to walk, then the patient can exercise in a chair 3 times a day. By flapping arms like a bird gently and raising legs outwards to the front.  If ambulatory, the patient can go for walks for 30 minutes 3 times a week. Then increase the  intensity and duration as tolerated.  Goal is to try to attain exercise frequency to 5 times a week.  If applicable: Best to perform resistance exercises (machines or weights) 2 days a week and cardio type exercises 3 days per week.  Return in about 3 months (around 02/13/2013) for Recheck medical problems.  Charlesia Canaday P. Modesto Charon, M.D.

## 2012-11-14 LAB — NMR LIPOPROFILE WITH LIPIDS
Cholesterol, Total: 154 mg/dL (ref <200)
HDL Particle Number: 36.3 umol/L (ref >=30.5)
HDL Size: 9.5 nm (ref >=9.2)
HDL-C: 57 mg/dL (ref >=40)
LDL (calc): 80 mg/dL (ref <100)
LDL Particle Number: 1011 nmol/L — ABNORMAL HIGH (ref <1000)
LDL Size: 20.7 nm (ref >20.5)
LP-IR Score: 35 (ref <=45)
Large HDL-P: 12 umol/L (ref >=4.8)
Large VLDL-P: 2.4 nmol/L (ref <=2.7)
Small LDL Particle Number: 333 nmol/L (ref <=527)
Triglycerides: 86 mg/dL (ref <150)
VLDL Size: 49.4 nm — ABNORMAL HIGH (ref <=46.6)

## 2012-11-18 NOTE — Progress Notes (Signed)
Quick Note:  Lab result at goal. No change in Medications for now. No Change in plans and follow up. ______ 

## 2012-11-20 ENCOUNTER — Telehealth: Payer: Self-pay | Admitting: Family Medicine

## 2012-11-20 NOTE — Telephone Encounter (Signed)
Pt aware of lab results 

## 2012-11-23 ENCOUNTER — Other Ambulatory Visit: Payer: Self-pay | Admitting: Neurological Surgery

## 2012-12-04 ENCOUNTER — Encounter (HOSPITAL_COMMUNITY): Payer: Self-pay | Admitting: Pharmacy Technician

## 2012-12-06 NOTE — Pre-Procedure Instructions (Signed)
[name]  12/06/2012   Your procedure is scheduled on:  12-17-2012   Monday   Report to [facility] at 8:45 AM.  Call this number if you have problems the morning of surgery: [phone]   Remember:   Do not eat food or drink liquids after midnight.    Take these medicines the morning of surgery with A SIP OF WATER: none              Discontinue Aspirin,coumadin,plavix and all herbal supplements      Do not wear jewelry, make-up or nail polish.  Do not wear lotions, powders, or perfumes. You may not wear deodorant.  Do not shave 48 hours prior to surgery.   Do not bring valuables to the hospital.  [facility] is not responsible  for any belongings or valuables.  Contacts, dentures or bridgework may not be worn into surgery.  Leave suitcase in the car. After surgery it may be brought to your room.   For patients admitted to the hospital, checkout time is 11:00 AM the day of discharge.   Patients discharged the day of surgery will not be allowed to drive home.     Special Instructions: Shower using CHG 2 nights before surgery and the night before surgery.  If you shower the day of surgery use CHG.  Use special wash - you have one bottle of CHG for all showers.  You should use approximately 1/3 of the bottle for each shower.   Please read over the following fact sheets that you were given: Pain Booklet, Coughing and Deep Breathing, Blood Transfusion Information and Surgical Site Infection Prevention

## 2012-12-07 ENCOUNTER — Encounter (HOSPITAL_COMMUNITY): Payer: Self-pay

## 2012-12-07 ENCOUNTER — Encounter (HOSPITAL_COMMUNITY)
Admission: RE | Admit: 2012-12-07 | Discharge: 2012-12-07 | Disposition: A | Discharge status: Home / Self Care | Payer: Medicare Other | Source: Ambulatory Visit | Attending: Neurological Surgery | Admitting: Neurological Surgery

## 2012-12-07 DIAGNOSIS — Z01812 Encounter for preprocedural laboratory examination: Secondary | ICD-10-CM | POA: Insufficient documentation

## 2012-12-07 LAB — BASIC METABOLIC PANEL
CO2: 28 mEq/L (ref 19–32)
Calcium: 9.3 mg/dL (ref 8.4–10.5)
Glucose, Bld: 121 mg/dL — ABNORMAL HIGH (ref 70–99)
Potassium: 4.2 mEq/L (ref 3.5–5.1)
Sodium: 140 mEq/L (ref 135–145)

## 2012-12-07 LAB — CBC
HCT: 39.8 % (ref 36.0–46.0)
Hemoglobin: 13.8 g/dL (ref 12.0–15.0)
MCH: 32 pg (ref 26.0–34.0)
MCV: 92.3 fL (ref 78.0–100.0)
RBC: 4.31 MIL/uL (ref 3.87–5.11)

## 2012-12-07 NOTE — Pre-Procedure Instructions (Signed)
[name]  12/07/2012   Your procedure is scheduled on:  12-17-2012   Monday   Report to [facility] Stay [facility] at 8:45 AM.  Call this number if you have problems the morning of surgery: [phone]   Remember:   Do not eat food or drink liquids after midnight.    Take these medicines the morning of surgery with A SIP OF WATER: none              Discontinue Aspirin,coumadin,plavix and all herbal supplements including fish oil and meloxicam on 12/12/12      Do not wear jewelry, make-up or nail polish.  Do not wear lotions, powders, or perfumes. You may not wear deodorant.  Do not shave 48 hours prior to surgery.   Do not bring valuables to the hospital.  [facility] is not responsible  for any belongings or valuables.  Contacts, dentures or bridgework may not be worn into surgery.  Leave suitcase in the car. After surgery it may be brought to your room.   For patients admitted to the hospital, checkout time is 11:00 AM the day of discharge.   Patients discharged the day of surgery will not be allowed to drive home.     Special Instructions: Shower using CHG 2 nights before surgery and the night before surgery.  If you shower the day of surgery use CHG.  Use special wash - you have one bottle of CHG for all showers.  You should use approximately 1/3 of the bottle for each shower.   Please read over the following fact sheets that you were given: Pain Booklet, Coughing and Deep Breathing, Blood Transfusion Information and Surgical Site Infection Prevention

## 2012-12-10 NOTE — Progress Notes (Signed)
Anesthesia chart review: Patient is a 67 year old female scheduled for L3-4, L4-5 laminectomy/foraminotomy, possible posterior segmental fixation and fusion, L3-L5 on 12/17/2012 by Dr. Danielle Dess. History includes obesity, nonsmoker, hypertension, hyperlipidemia, endometrial cancer status post TAH/BSO 10/2010, LOA with jejunal small bowel resection, colon serosal repair, and primary ventral hernia repair for recurrent small bowel obstruction secondary to ventral hernia 07/19/2012, right THA '11, right TKR '11, L2-4 anterior lumbar decompression '09, left double J-stent/ureteral stent for left hydronephrosis with UTI/ureteral stricture '10. I believe her PCP is Dr. Leodis Sias with [facility].  EKG on 07/11/12 showed NSR, minimal voltage criteria for LVH, cannot rule out anterior infarct (age undetermined).  I was not felt significantly changed when compared to her prior EKG on 09/10/09.  CXR on 11/02/12 showed no acute cardiopulmonary abnormality seen.  Preoperative labs noted.  Patient tolerated a surgical procedure earlier this year.  She will be evaluated by her assigned anesthesiologist on the day of surgery.  If no acute changes then I would anticipate that she could proceed as planned.  Velna Ochs [facility] Stay [facility]/Anesthesiology Phone [phone] 12/10/2012 10:21 AM

## 2012-12-16 MED ORDER — CEFAZOLIN SODIUM-DEXTROSE 2-3 GM-% IV SOLR
2.0000 g | INTRAVENOUS | Status: AC
  Administered 2012-12-17: 2 g via INTRAVENOUS
  Filled 2012-12-16: qty 50

## 2012-12-17 ENCOUNTER — Observation Stay (HOSPITAL_COMMUNITY)
Admission: RE | Admit: 2012-12-17 | Discharge: 2012-12-19 | Disposition: A | Discharge status: Home / Self Care | Payer: Medicare Other | Source: Ambulatory Visit | Attending: Neurological Surgery | Admitting: Neurological Surgery

## 2012-12-17 ENCOUNTER — Ambulatory Visit (HOSPITAL_COMMUNITY): Payer: Medicare Other

## 2012-12-17 ENCOUNTER — Encounter (HOSPITAL_COMMUNITY)
Admission: RE | Disposition: A | Discharge status: Home / Self Care | Payer: Self-pay | Source: Ambulatory Visit | Attending: Neurological Surgery

## 2012-12-17 ENCOUNTER — Encounter (HOSPITAL_COMMUNITY): Payer: Self-pay | Admitting: *Deleted

## 2012-12-17 ENCOUNTER — Ambulatory Visit (HOSPITAL_COMMUNITY): Payer: Medicare Other | Admitting: Certified Registered"

## 2012-12-17 ENCOUNTER — Encounter (HOSPITAL_COMMUNITY): Payer: Self-pay | Admitting: Vascular Surgery

## 2012-12-17 DIAGNOSIS — S32009A Unspecified fracture of unspecified lumbar vertebra, initial encounter for closed fracture: Secondary | ICD-10-CM

## 2012-12-17 DIAGNOSIS — M48061 Spinal stenosis, lumbar region without neurogenic claudication: Secondary | ICD-10-CM | POA: Insufficient documentation

## 2012-12-17 DIAGNOSIS — T84498A Other mechanical complication of other internal orthopedic devices, implants and grafts, initial encounter: Principal | ICD-10-CM | POA: Insufficient documentation

## 2012-12-17 DIAGNOSIS — Y832 Surgical operation with anastomosis, bypass or graft as the cause of abnormal reaction of the patient, or of later complication, without mention of misadventure at the time of the procedure: Secondary | ICD-10-CM | POA: Insufficient documentation

## 2012-12-17 DIAGNOSIS — Z981 Arthrodesis status: Secondary | ICD-10-CM | POA: Insufficient documentation

## 2012-12-17 DIAGNOSIS — Z79899 Other long term (current) drug therapy: Secondary | ICD-10-CM | POA: Insufficient documentation

## 2012-12-17 DIAGNOSIS — S32009K Unspecified fracture of unspecified lumbar vertebra, subsequent encounter for fracture with nonunion: Secondary | ICD-10-CM

## 2012-12-17 DIAGNOSIS — I10 Essential (primary) hypertension: Secondary | ICD-10-CM | POA: Insufficient documentation

## 2012-12-17 HISTORY — PX: POSTERIOR LUMBAR FUSION: SHX6036

## 2012-12-17 HISTORY — PX: BACK SURGERY: SHX140

## 2012-12-17 SURGERY — POSTERIOR LUMBAR FUSION 2 LEVEL
Anesthesia: General | Site: Spine Lumbar | Wound class: Clean

## 2012-12-17 MED ORDER — DEXTROSE 5 % IV SOLN
500.0000 mg | INTRAVENOUS | Status: DC
  Filled 2012-12-17: qty 5

## 2012-12-17 MED ORDER — LISINOPRIL-HYDROCHLOROTHIAZIDE 20-12.5 MG PO TABS: 1.0000 | ORAL_TABLET | Freq: Every morning | ORAL | Status: DC

## 2012-12-17 MED ORDER — SODIUM CHLORIDE 0.9 % IJ SOLN
3.0000 mL | Freq: Two times a day (BID) | INTRAMUSCULAR | Status: DC
  Administered 2012-12-17 – 2012-12-18 (×2): 3 mL via INTRAVENOUS

## 2012-12-17 MED ORDER — PHENYLEPHRINE HCL 10 MG/ML IJ SOLN
10.0000 mg | INTRAVENOUS | Status: DC | PRN
  Administered 2012-12-17: 25 ug/min via INTRAVENOUS

## 2012-12-17 MED ORDER — SODIUM CHLORIDE 0.9 % IR SOLN
Status: DC | PRN
  Administered 2012-12-17: 12:00:00

## 2012-12-17 MED ORDER — GABAPENTIN 300 MG PO CAPS
300.0000 mg | ORAL_CAPSULE | Freq: Every day | ORAL | Status: DC
  Administered 2012-12-17 – 2012-12-18 (×2): 300 mg via ORAL
  Filled 2012-12-17 (×3): qty 1

## 2012-12-17 MED ORDER — PROMETHAZINE HCL 25 MG/ML IJ SOLN
6.2500 mg | INTRAMUSCULAR | Status: AC | PRN
  Administered 2012-12-17 (×2): 6.25 mg via INTRAVENOUS

## 2012-12-17 MED ORDER — OXYCODONE HCL 5 MG PO TABS
5.0000 mg | ORAL_TABLET | Freq: Once | ORAL | Status: AC | PRN
  Administered 2012-12-17: 5 mg via ORAL

## 2012-12-17 MED ORDER — OXYCODONE HCL 5 MG/5ML PO SOLN: 5.0000 mg | Freq: Once | ORAL | Status: AC | PRN

## 2012-12-17 MED ORDER — DEXTROSE 5 % IV SOLN
500.0000 mg | Freq: Four times a day (QID) | INTRAVENOUS | Status: DC | PRN
  Filled 2012-12-17: qty 5

## 2012-12-17 MED ORDER — FENTANYL CITRATE 0.05 MG/ML IJ SOLN
INTRAMUSCULAR | Status: DC | PRN
  Administered 2012-12-17 (×2): 50 ug via INTRAVENOUS
  Administered 2012-12-17: 150 ug via INTRAVENOUS
  Administered 2012-12-17: 50 ug via INTRAVENOUS

## 2012-12-17 MED ORDER — 0.9 % SODIUM CHLORIDE (POUR BTL) OPTIME
TOPICAL | Status: DC | PRN
  Administered 2012-12-17: 1000 mL

## 2012-12-17 MED ORDER — ROCURONIUM BROMIDE 100 MG/10ML IV SOLN
INTRAVENOUS | Status: DC | PRN
  Administered 2012-12-17 (×2): 10 mg via INTRAVENOUS
  Administered 2012-12-17: 50 mg via INTRAVENOUS

## 2012-12-17 MED ORDER — PROMETHAZINE HCL 25 MG/ML IJ SOLN
INTRAMUSCULAR | Status: AC
  Filled 2012-12-17: qty 1

## 2012-12-17 MED ORDER — SODIUM CHLORIDE 0.9 % IJ SOLN: 3.0000 mL | INTRAMUSCULAR | Status: DC | PRN

## 2012-12-17 MED ORDER — LIDOCAINE-EPINEPHRINE 1 %-1:100000 IJ SOLN
INTRAMUSCULAR | Status: DC | PRN
  Administered 2012-12-17: 5 mL

## 2012-12-17 MED ORDER — MORPHINE SULFATE 2 MG/ML IJ SOLN
1.0000 mg | INTRAMUSCULAR | Status: DC | PRN
Start: 2012-12-17 — End: 2012-12-19

## 2012-12-17 MED ORDER — ACETAMINOPHEN 325 MG PO TABS: 650.0000 mg | ORAL_TABLET | ORAL | Status: DC | PRN

## 2012-12-17 MED ORDER — LIDOCAINE HCL (CARDIAC) 20 MG/ML IV SOLN
INTRAVENOUS | Status: DC | PRN
  Administered 2012-12-17: 60 mg via INTRAVENOUS

## 2012-12-17 MED ORDER — POLYETHYLENE GLYCOL 3350 17 G PO PACK
17.0000 g | PACK | Freq: Every day | ORAL | Status: DC | PRN
  Filled 2012-12-17: qty 1

## 2012-12-17 MED ORDER — OXYCODONE HCL 5 MG PO TABS
ORAL_TABLET | ORAL | Status: AC
  Filled 2012-12-17: qty 1

## 2012-12-17 MED ORDER — PHENYLEPHRINE HCL 10 MG/ML IJ SOLN
INTRAMUSCULAR | Status: DC | PRN
  Administered 2012-12-17 (×3): 40 ug via INTRAVENOUS
  Administered 2012-12-17: 80 ug via INTRAVENOUS

## 2012-12-17 MED ORDER — PROMETHAZINE HCL 25 MG/ML IJ SOLN
INTRAMUSCULAR | Status: AC
  Administered 2012-12-17: 6.25 mg via INTRAVENOUS
  Filled 2012-12-17: qty 1

## 2012-12-17 MED ORDER — MENTHOL 3 MG MT LOZG: 1.0000 | LOZENGE | OROMUCOSAL | Status: DC | PRN

## 2012-12-17 MED ORDER — PHENOL 1.4 % MT LIQD: 1.0000 | OROMUCOSAL | Status: DC | PRN

## 2012-12-17 MED ORDER — DOCUSATE SODIUM 100 MG PO CAPS
100.0000 mg | ORAL_CAPSULE | Freq: Two times a day (BID) | ORAL | Status: DC
  Administered 2012-12-17 – 2012-12-18 (×3): 100 mg via ORAL
  Filled 2012-12-17 (×3): qty 1

## 2012-12-17 MED ORDER — SENNA 8.6 MG PO TABS
1.0000 | ORAL_TABLET | Freq: Two times a day (BID) | ORAL | Status: DC
  Administered 2012-12-17 – 2012-12-18 (×3): 8.6 mg via ORAL
  Filled 2012-12-17 (×5): qty 1

## 2012-12-17 MED ORDER — AMITRIPTYLINE HCL 25 MG PO TABS
25.0000 mg | ORAL_TABLET | Freq: Every day | ORAL | Status: DC
  Administered 2012-12-17 – 2012-12-18 (×2): 25 mg via ORAL
  Filled 2012-12-17 (×3): qty 1

## 2012-12-17 MED ORDER — PROPOFOL 10 MG/ML IV BOLUS
INTRAVENOUS | Status: DC | PRN
  Administered 2012-12-17: 180 mg via INTRAVENOUS

## 2012-12-17 MED ORDER — HYDROMORPHONE HCL PF 1 MG/ML IJ SOLN
INTRAMUSCULAR | Status: AC
  Filled 2012-12-17: qty 1

## 2012-12-17 MED ORDER — LISINOPRIL 20 MG PO TABS
20.0000 mg | ORAL_TABLET | Freq: Every day | ORAL | Status: DC
  Administered 2012-12-18: 20 mg via ORAL
  Filled 2012-12-17 (×2): qty 1

## 2012-12-17 MED ORDER — EPHEDRINE SULFATE 50 MG/ML IJ SOLN
INTRAMUSCULAR | Status: DC | PRN
  Administered 2012-12-17 (×2): 10 mg via INTRAVENOUS
  Administered 2012-12-17 (×3): 5 mg via INTRAVENOUS

## 2012-12-17 MED ORDER — ARTIFICIAL TEARS OP OINT
TOPICAL_OINTMENT | OPHTHALMIC | Status: DC | PRN
  Administered 2012-12-17: 1 via OPHTHALMIC

## 2012-12-17 MED ORDER — SODIUM CHLORIDE 0.9 % IV SOLN: INTRAVENOUS | Status: DC

## 2012-12-17 MED ORDER — SODIUM CHLORIDE 0.9 % IV SOLN
INTRAVENOUS | Status: AC
  Filled 2012-12-17: qty 500

## 2012-12-17 MED ORDER — SODIUM CHLORIDE 0.9 % IV SOLN: 250.0000 mL | INTRAVENOUS | Status: DC

## 2012-12-17 MED ORDER — MAGNESIUM CITRATE PO SOLN
1.0000 | Freq: Once | ORAL | Status: AC | PRN
  Filled 2012-12-17: qty 296

## 2012-12-17 MED ORDER — THROMBIN 20000 UNITS EX SOLR
CUTANEOUS | Status: DC | PRN
  Administered 2012-12-17: 12:00:00 via TOPICAL

## 2012-12-17 MED ORDER — BUPIVACAINE HCL (PF) 0.5 % IJ SOLN
INTRAMUSCULAR | Status: DC | PRN
  Administered 2012-12-17: 5 mL

## 2012-12-17 MED ORDER — CEFAZOLIN SODIUM 1-5 GM-% IV SOLN
1.0000 g | Freq: Three times a day (TID) | INTRAVENOUS | Status: AC
  Administered 2012-12-17 – 2012-12-18 (×2): 1 g via INTRAVENOUS
  Filled 2012-12-17 (×2): qty 50

## 2012-12-17 MED ORDER — GLYCOPYRROLATE 0.2 MG/ML IJ SOLN
INTRAMUSCULAR | Status: DC | PRN
  Administered 2012-12-17: 0.2 mg via INTRAVENOUS
  Administered 2012-12-17: 0.6 mg via INTRAVENOUS

## 2012-12-17 MED ORDER — METHOCARBAMOL 500 MG PO TABS
500.0000 mg | ORAL_TABLET | Freq: Four times a day (QID) | ORAL | Status: DC | PRN
  Administered 2012-12-19 (×2): 500 mg via ORAL
  Filled 2012-12-17 (×2): qty 1

## 2012-12-17 MED ORDER — ONDANSETRON HCL 4 MG/2ML IJ SOLN: 4.0000 mg | INTRAMUSCULAR | Status: DC | PRN

## 2012-12-17 MED ORDER — SIMVASTATIN 20 MG PO TABS
20.0000 mg | ORAL_TABLET | Freq: Every day | ORAL | Status: DC
Start: 2012-12-17 — End: 2012-12-19
  Administered 2012-12-17 – 2012-12-18 (×2): 20 mg via ORAL
  Filled 2012-12-17 (×3): qty 1

## 2012-12-17 MED ORDER — OXYCODONE-ACETAMINOPHEN 5-325 MG PO TABS
1.0000 | ORAL_TABLET | ORAL | Status: DC | PRN
  Administered 2012-12-17 – 2012-12-19 (×7): 2 via ORAL
  Filled 2012-12-17 (×7): qty 2

## 2012-12-17 MED ORDER — ACETAMINOPHEN 650 MG RE SUPP: 650.0000 mg | RECTAL | Status: DC | PRN

## 2012-12-17 MED ORDER — HYDROCHLOROTHIAZIDE 12.5 MG PO CAPS
12.5000 mg | ORAL_CAPSULE | Freq: Every day | ORAL | Status: DC
  Administered 2012-12-18: 12.5 mg via ORAL
  Filled 2012-12-17 (×2): qty 1

## 2012-12-17 MED ORDER — BACITRACIN 50000 UNITS IM SOLR
INTRAMUSCULAR | Status: AC
  Filled 2012-12-17: qty 1

## 2012-12-17 MED ORDER — NEOSTIGMINE METHYLSULFATE 1 MG/ML IJ SOLN
INTRAMUSCULAR | Status: DC | PRN
  Administered 2012-12-17: 4 mg via INTRAVENOUS

## 2012-12-17 MED ORDER — HYDROMORPHONE HCL PF 1 MG/ML IJ SOLN
0.2500 mg | INTRAMUSCULAR | Status: DC | PRN
  Administered 2012-12-17 (×4): 0.5 mg via INTRAVENOUS

## 2012-12-17 MED ORDER — DEXAMETHASONE SODIUM PHOSPHATE 10 MG/ML IJ SOLN
INTRAMUSCULAR | Status: DC | PRN
  Administered 2012-12-17: 10 mg via INTRAVENOUS

## 2012-12-17 MED ORDER — SORBITOL 70 % SOLN
30.0000 mL | Freq: Every day | Status: DC | PRN
  Filled 2012-12-17: qty 30

## 2012-12-17 MED ORDER — MIDAZOLAM HCL 5 MG/5ML IJ SOLN
INTRAMUSCULAR | Status: DC | PRN
  Administered 2012-12-17 (×2): 1 mg via INTRAVENOUS

## 2012-12-17 MED ORDER — LACTATED RINGERS IV SOLN
INTRAVENOUS | Status: DC
  Administered 2012-12-17: 10:00:00 via INTRAVENOUS

## 2012-12-17 MED ORDER — LACTATED RINGERS IV SOLN
INTRAVENOUS | Status: DC | PRN
  Administered 2012-12-17 (×3): via INTRAVENOUS

## 2012-12-17 SURGICAL SUPPLY — 70 items
ADH SKN CLS APL DERMABOND .7 (GAUZE/BANDAGES/DRESSINGS)
ADH SKN CLS LQ APL DERMABOND (GAUZE/BANDAGES/DRESSINGS) ×1
BAG DECANTER FOR FLEXI CONT (MISCELLANEOUS) ×2 IMPLANT
BLADE SURG ROTATE 9660 (MISCELLANEOUS) IMPLANT
BUR MATCHSTICK NEURO 3.0 LAGG (BURR) ×2 IMPLANT
CANISTER SUCTION 2500CC (MISCELLANEOUS) ×2 IMPLANT
CLOTH BEACON ORANGE TIMEOUT ST (SAFETY) ×2 IMPLANT
CONT SPEC 4OZ CLIKSEAL STRL BL (MISCELLANEOUS) ×4 IMPLANT
COVER BACK TABLE 24X17X13 BIG (DRAPES) IMPLANT
COVER TABLE BACK 60X90 (DRAPES) ×2 IMPLANT
DECANTER SPIKE VIAL GLASS SM (MISCELLANEOUS) ×2 IMPLANT
DERMABOND ADHESIVE PROPEN (GAUZE/BANDAGES/DRESSINGS) ×1
DERMABOND ADVANCED (GAUZE/BANDAGES/DRESSINGS)
DERMABOND ADVANCED .7 DNX12 (GAUZE/BANDAGES/DRESSINGS) ×1 IMPLANT
DERMABOND ADVANCED .7 DNX6 (GAUZE/BANDAGES/DRESSINGS) IMPLANT
DRAPE C-ARM 42X72 X-RAY (DRAPES) ×4 IMPLANT
DRAPE LAPAROTOMY 100X72X124 (DRAPES) ×2 IMPLANT
DRAPE POUCH INSTRU U-SHP 10X18 (DRAPES) ×2 IMPLANT
DRAPE PROXIMA HALF (DRAPES) ×1 IMPLANT
DRSG OPSITE POSTOP 4X10 (GAUZE/BANDAGES/DRESSINGS) ×1 IMPLANT
DURAPREP 26ML APPLICATOR (WOUND CARE) ×2 IMPLANT
ELECT REM PT RETURN 9FT ADLT (ELECTROSURGICAL) ×2
ELECTRODE REM PT RTRN 9FT ADLT (ELECTROSURGICAL) ×1 IMPLANT
GAUZE SPONGE 4X4 16PLY XRAY LF (GAUZE/BANDAGES/DRESSINGS) ×1 IMPLANT
GLOVE BIOGEL PI IND STRL 7.0 (GLOVE) IMPLANT
GLOVE BIOGEL PI IND STRL 7.5 (GLOVE) IMPLANT
GLOVE BIOGEL PI IND STRL 8.5 (GLOVE) ×2 IMPLANT
GLOVE BIOGEL PI INDICATOR 7.0 (GLOVE) ×1
GLOVE BIOGEL PI INDICATOR 7.5 (GLOVE) ×3
GLOVE BIOGEL PI INDICATOR 8.5 (GLOVE) ×2
GLOVE ECLIPSE 6.5 STRL STRAW (GLOVE) ×1 IMPLANT
GLOVE ECLIPSE 7.5 STRL STRAW (GLOVE) ×3 IMPLANT
GLOVE ECLIPSE 8.5 STRL (GLOVE) ×4 IMPLANT
GLOVE EXAM NITRILE LRG STRL (GLOVE) IMPLANT
GLOVE EXAM NITRILE MD LF STRL (GLOVE) IMPLANT
GLOVE EXAM NITRILE XL STR (GLOVE) IMPLANT
GLOVE EXAM NITRILE XS STR PU (GLOVE) IMPLANT
GLOVE SURG SS PI 7.0 STRL IVOR (GLOVE) ×3 IMPLANT
GOWN BRE IMP SLV AUR LG STRL (GOWN DISPOSABLE) ×1 IMPLANT
GOWN BRE IMP SLV AUR XL STRL (GOWN DISPOSABLE) ×5 IMPLANT
GOWN STRL REIN 2XL LVL4 (GOWN DISPOSABLE) ×2 IMPLANT
KIT BASIN OR (CUSTOM PROCEDURE TRAY) ×2 IMPLANT
KIT ROOM TURNOVER OR (KITS) ×2 IMPLANT
NDL SPNL 18GX3.5 QUINCKE PK (NEEDLE) IMPLANT
NEEDLE HYPO 22GX1.5 SAFETY (NEEDLE) ×2 IMPLANT
NEEDLE SPNL 18GX3.5 QUINCKE PK (NEEDLE) IMPLANT
NS IRRIG 1000ML POUR BTL (IV SOLUTION) ×2 IMPLANT
PACK FOAM VITOSS 10CC (Orthopedic Implant) ×1 IMPLANT
PACK LAMINECTOMY NEURO (CUSTOM PROCEDURE TRAY) ×2 IMPLANT
PAD ARMBOARD 7.5X6 YLW CONV (MISCELLANEOUS) ×8 IMPLANT
PATTIES SURGICAL .5 X1 (DISPOSABLE) ×1 IMPLANT
ROD TI 5.5MM 6CM (Rod) ×2 IMPLANT
SCREW 35MM (Screw) ×4 IMPLANT
SCREW 40MM (Screw) ×2 IMPLANT
SCREW SET SPINAL STD HEXALOBE (Screw) ×6 IMPLANT
SPONGE GAUZE 4X4 12PLY (GAUZE/BANDAGES/DRESSINGS) ×1 IMPLANT
SPONGE LAP 4X18 X RAY DECT (DISPOSABLE) IMPLANT
SPONGE SURGIFOAM ABS GEL 100 (HEMOSTASIS) ×2 IMPLANT
SUT VIC AB 1 CT1 18XBRD ANBCTR (SUTURE) ×1 IMPLANT
SUT VIC AB 1 CT1 8-18 (SUTURE) ×4
SUT VIC AB 2-0 CP2 18 (SUTURE) ×3 IMPLANT
SUT VIC AB 3-0 SH 8-18 (SUTURE) ×2 IMPLANT
SYR 20ML ECCENTRIC (SYRINGE) ×2 IMPLANT
SYR 3ML LL SCALE MARK (SYRINGE) ×8 IMPLANT
SYR 5ML LL (SYRINGE) ×1 IMPLANT
TOWEL OR 17X24 6PK STRL BLUE (TOWEL DISPOSABLE) ×2 IMPLANT
TOWEL OR 17X26 10 PK STRL BLUE (TOWEL DISPOSABLE) ×2 IMPLANT
TRAP SPECIMEN MUCOUS 40CC (MISCELLANEOUS) ×2 IMPLANT
TRAY FOLEY CATH 14FRSI W/METER (CATHETERS) ×2 IMPLANT
WATER STERILE IRR 1000ML POUR (IV SOLUTION) ×2 IMPLANT

## 2012-12-17 NOTE — Op Note (Signed)
Date of surgery: 12/17/2012 Preoperative diagnosis: Lumbar stenosis L3-4 L4-5 status post anterior lumbar interbody arthrodesis with pseudoarthrosis L3-4 and L4-5, lumbar radiculopathy, status post arthrodesis L5-S1 Postoperative diagnosis: Lumbar stenosis L3-4 L4-5 status post anterior lumbar interbody arthrodesis with pseudoarthrosis L3-4 L4-5, lumbar radiculopathy, status post arthrodesis L5-S1 Procedure: Laminectomy L3-L4 and partial L5, decompression of L3-L4 and L5 nerve roots from lateral recess stenosis, posterior lateral arthrodesis with autograft and allograft L3-L5 segmental fixation with pedicle screws L3 L4-L5. Surgeon: Barnett Abu Assistant: Barbaraann Barthel Anesthesia: Gen. endotracheal Indications: The patient is a 67 year old individual is had a previous posterior interbody arthrodesis using a ray cage technique she tolerated that well but then developed adjacent level disease at L3-4 and L4-5. She underwent anterior interbody arthrodesis however she showed poor signs of any healing of these interbody grafts. She's developed some additional spondylitic stenosis in the lateral recesses particularly affecting the L3-L4 and L5 nerve roots she's been advised regarding surgical decompression posteriorly via laminectomy and foraminotomies and then posterior lateral arthrodesis with segmental fixation from L3-L5.  Procedure: Patient was brought to the operating room supine on a stretcher. After the smooth induction of general endotracheal anesthesia she was turned prone. The back was prepped with alcohol and DuraPrep. Is draped sterilely. A midline incision was created in the lumbar spine just above her previous laminectomy incision. This was carried down to the lumbar dorsal fascia, this was opened on either side of the midline. Localizing radiographs identified the spinous processes of L3 and L4 positively. Then by dissecting over the transverse processes as able to ascertain that there was gross  motion in the interspaces of L3-4 and L4-5. This confirmed the presence of a pseudoarthrosis at each of these levels. Then by dissecting over the facet joints the intertransverse spaces at L3-4 and L4-5 were identified and a transverse processes of L3-L4 and L5 were decorticated these were packed off.  Laminectomy was then performed removing the entire laminar arch of L4 and removing most of the laminar arch of L3 including the entirety of the inferior facet of L3. Thick and redundant yellow ligament was encountered under this. This causes significant stenosis for the exiting L3 nerve root superiorly. This was dissected with a combination of a high-speed drill and 2 and 3 mm Kerrison punches. Entire path of the L3 nerve root into the intertransverse space was decompressed. L4 nerve root was similarly encountered with significant stenosis and again a decompression using combination of 2 and 3 mm punches and a high-speed drill was used. Next the L5 nerve roots were encountered and these had significant epidural fibrosis in addition to the bony stenosis at the top of the foramen. This was decompressed in a similar fashion. Once all decompressions were completed Gelfoam patties were placed along the epidural space to maintain hemostasis and also to protect the paths of the nerve roots. With the intertransverse spaces being decorticated I used the autograft from the laminectomy mixed with Vitoss packed into the intertransverse spaces.  Pedicle screws were then placed in L3-L4 and L5 using fluoroscopic guidance. 6.5 x 40 mm screws were placed in L3, 6.5 x 35 mm screws were placed in L4 and L5. Precontoured rods were then placed between the screw caps and tightened into a neutral construct. Final radiographs are obtained and identified good placement of the screws and straight alignment in the neutral position of the lumbar spine with a adequate lordosis.  The wound was then closed with #1 Vicryl in the lumbar dorsal  fascia  2-0 Vicryl subcutaneous tissues and 3-0 Vicryl subcuticularly. Prior to closure the paths of the L3 the L4 and L5 nerve roots read sounded and found to be free and clear Gelfoam was again laid over them. 20 cc of half percent Marcaine was injected into the paraspinous fascia. Dermabond was placed on the skin. A 300 cc blood loss was encountered. No Cell Saver blood was returned to the patient. She tolerated procedure well returned to recovery room in stable condition.

## 2012-12-17 NOTE — Progress Notes (Signed)
Patient ID: [name], female   DOB: 1945-05-25, 67 y.o.   MRN: [id number] Feels well. Incision is clean and dry. Motor function is good. Has not been out of bed yet. Ear to mobilize.

## 2012-12-17 NOTE — Anesthesia Procedure Notes (Signed)
Procedure Name: Intubation Date/Time: 12/17/2012 11:03 AM Performed by: Ellin Goodie Pre-anesthesia Checklist: Patient identified, Emergency Drugs available, Suction available, Patient being monitored and Timeout performed Patient Re-evaluated:Patient Re-evaluated prior to inductionOxygen Delivery Method: Circle system utilized Preoxygenation: Pre-oxygenation with 100% oxygen Intubation Type: IV induction Ventilation: Mask ventilation without difficulty Laryngoscope Size: Mac and 3 Grade View: Grade I Tube type: Oral Tube size: 7.5 mm Number of attempts: 1 Airway Equipment and Method: Stylet Placement Confirmation: ETT inserted through vocal cords under direct vision,  positive ETCO2 and breath sounds checked- equal and bilateral Secured at: 22 cm Tube secured with: Tape Dental Injury: Teeth and Oropharynx as per pre-operative assessment  Comments: Easy atraumatic induction and intubation with MAC 3 blade.  Dr. Krista Blue verified placement of ETT.  Carlynn Herald,  CRNA

## 2012-12-17 NOTE — H&P (Addendum)
[name] is an 67 y.o. female.   Chief Complaint: Back and leg pain. HPI: 67 year old individual who in the late 1990s underwent decompression and fusion at L5-S1 secondary to severe spondylitic stenosis. She has transitional anatomy with a fully formed S1-S2 segment. The patient subsequently developed problems with spondylitic stenosis and degenerative changes at L3-4 and L4-5 above her fusion. She underwent an anterior lumbar interbody arthrodesis approximately 5 years ago. For the past year or more she's had increasing back pain and leg pain. A recent myelogram demonstrates that the patient likely has pseudoarthrosis at L3-4 L4-5 and she has developed increasing stenosis particularly at L4-5 but to a significant degree and lateral recesses at L3-L4. She is now admitted to undergo surgical revision and decompression posteriorly exploration of the fusion of a pseudoarthrosis is noted then she undergoes additional fixation.  Past Medical History  Diagnosis Date  . Endometrioid adenocarcinoma   . Hypertension   . Obesity (BMI 30-39.9) 04/27/2012  . Arthritis   . Hyperlipidemia   . Ureteral adhesion, left 2010    s/p ureterolysis & stenting Dr. Dolores Lory now  . 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 Colon polyp, hyperplastic 04/27/2012    Colonoscopy 2010.  No adenomatous polyps   . History of endometrial cancer s/p TAH BSO June2013 06/15/2011    Grade 1 endometrioid adenocarcinoma with focal superficial myometrial invasion of 0.1 cm with a myometrium of 1.8 cm or approximately 7%. She had a 5-cm tumor. No lymphovascular space Involvement.    . Mass of in fold of jejunum s/p SB resection 07/19/2012 07/20/2012  . UTI (urinary tract infection)     Past Surgical History  Procedure Laterality Date  . Abdominal hysterectomy  11/02/10    TAH, BSO, lysis of adhesions for endometrial cancer  . Incisional hernia repair  11/02/10  . Back  surgery  1989, 1999, 2009    anterior L2-3, L3-4 arthrodesis  . Kidney surgery  2010    Left kidney  . Bladder suspension  2003  . Foot surgery  06/2010    left foot surgery  . Joint replacement  09/2009    Right total knee  . Total hip arthroplasty  01/2010    Right total hip  . Cataract extraction, bilateral  2003  . Total knee arthroplasty      right  . Ureterolysis  2010    lap/open left ureterolysis & omental flap, ureter stent  . Ventral hernia repair N/A 07/19/2012    Procedure: LAPAROSCOPIC LYSIS OF ADHESIONS, SMALL BOWEL RESECTION, SEROSAL REPAIR, PRIMARY VENTRAL HERNIA REPAIR;  Surgeon: Ardeth Sportsman, MD;  Location: [facility];  Service: General;  Laterality: N/A;  . Hernia repair  07/19/12    lap VWH repair  . Eye surgery      Family History  Problem Relation Age of Onset  . Diabetes Mother   . Cancer Mother     lung  . Diabetes Father   . Cancer Father     liver  . Colon cancer Sister   . Diabetes Sister   . Cancer Sister     colon   Social History:  reports that she has never smoked. She does not have any smokeless tobacco history on file. She reports that she does not drink alcohol or use illicit drugs.  Allergies:  Allergies  Allergen Reactions  . Cashew Nut Oil Hives  . Sulfamethoxazole     Mouth sores  . Zofran  Hives and Nausea Only    No prescriptions prior to admission    No results found for this or any previous visit (from the past 48 hour(s)). No results found.  Review of Systems  Constitutional: Positive for malaise/fatigue.  HENT: Negative.   Eyes: Negative.   Respiratory: Negative.   Cardiovascular: Negative.   Gastrointestinal: Negative.   Genitourinary: Negative.        History of a dilated ureter on the left status post previous anterior decompression and arthrodesis L3-4 L4-5  Musculoskeletal: Positive for back pain.  Skin: Negative.   Neurological: Positive for tingling, focal weakness and weakness.  Psychiatric/Behavioral:  Negative.     There were no vitals taken for this visit. Physical Exam  Constitutional: She is oriented to person, place, and time. She appears well-developed and well-nourished.  Obese  HENT:  Head: Normocephalic and atraumatic.  Eyes: EOM are normal. Pupils are equal, round, and reactive to light.  Neck: Normal range of motion. Neck supple.  Cardiovascular: Normal rate.   Respiratory: Breath sounds normal.  GI: Soft. Bowel sounds are normal.  Musculoskeletal:  Significant centralized low back pain to palpation and percussion and inspection.  Neurological: She is alert and oriented to person, place, and time.  Reflexes in patella and Achilles both. Trace reflexes in biceps and triceps. Cranial nerve examination is within the limits of normal.  Skin: Skin is warm and dry.  Psychiatric: She has a normal mood and affect. Her behavior is normal. Judgment and thought content normal.     Assessment/Plan  A recent lumbar myelogram demonstrates that though the interbody grafts are in good position at L3-4 and L4-5, she has developed progressive spondylotic stenosis in the lateral recesses at L3-4 and L4-5.  L4-5 appears to be much more involved particularly on the left side than on the right side and L3-4.  Nonetheless, all these levels are involved.  [name] has been plagued by some significant radicular pain, but mostly centralized back pain.  This leads me to believe that perhaps her arthrodesis is not as solid as is presumed on the basis of plain x-rays and even the CT scan would suggest that she has formed solid bony fusion. Again transitional anatomy with a fully formed S1-S2 segment in the previous interbody arthrodesis using a ray cage technique L5-S1 is demonstrated.  IMPRESSION/PLAN:    I am concerned that because of the nature of her back pain and the nature of these bone spurs that her fusion may not be completely solid.  If that is the case and we decompress L3-4 and L4-5, we will  explore the fusion. She may ultimately need to have some posterior fixation to stabilize her back.  This would help prevent any recurrence of the foraminal stenosis, which would also help with the back pain significantly.  In that regard, I have advised [name] that we should schedule her for posterior lumbar decompression via laminotomies and foraminotomies at L3-4 and L4-5 and possibly segmental fixation at L3-L5 with posterolateral arthrodesis also. She is admitted for surgery.  [name] 12/17/2012, 6:47 AM

## 2012-12-17 NOTE — Transfer of Care (Signed)
Immediate Anesthesia Transfer of Care Note  Patient: [name]  Procedure(s) Performed: Procedure(s): Laminectomy Lumbar three-four,partial Lumbar five,Decompression lumbar three-four,Lumbar five nerve roots;Posterolateral Arthrodesis segmental fixation Lumbar three-Lumbar five. (N/A)  Patient Location: PACU  Anesthesia Type:General  Level of Consciousness: awake, alert  and oriented  Airway & Oxygen Therapy: Patient connected to face mask oxygen  Post-op Assessment: Report given to PACU RN  Post vital signs: stable  Complications: No apparent anesthesia complications

## 2012-12-17 NOTE — Anesthesia Preprocedure Evaluation (Addendum)
Anesthesia Evaluation  Patient identified by MRN, date of birth, ID band Patient awake    Reviewed: Allergy & Precautions, H&P , NPO status , Patient's Chart, lab work & pertinent test results, reviewed documented beta blocker date and time   Airway Mallampati: II TM Distance: >3 FB Neck ROM: Full    Dental  (+) Edentulous Upper, Edentulous Lower and Dental Advisory Given   Pulmonary neg pulmonary ROS,  breath sounds clear to auscultation        Cardiovascular hypertension, Pt. on medications Rhythm:Regular     Neuro/Psych negative neurological ROS     GI/Hepatic negative GI ROS, Neg liver ROS,   Endo/Other  negative endocrine ROSMorbid obesity  Renal/GU negative Renal ROS     Musculoskeletal negative musculoskeletal ROS (+)   Abdominal (+)  Abdomen: soft. Bowel sounds: normal.  Peds  Hematology negative hematology ROS (+)   Anesthesia Other Findings   Reproductive/Obstetrics negative OB ROS                        Anesthesia Physical Anesthesia Plan  ASA: III  Anesthesia Plan: General   Post-op Pain Management:    Induction: Intravenous  Airway Management Planned: Oral ETT  Additional Equipment:   Intra-op Plan:   Post-operative Plan: Extubation in OR  Informed Consent: I have reviewed the patients History and Physical, chart, labs and discussed the procedure including the risks, benefits and alternatives for the proposed anesthesia with the patient or authorized representative who has indicated his/her understanding and acceptance.   Dental advisory given  Plan Discussed with: CRNA, Anesthesiologist and Surgeon  Anesthesia Plan Comments:         Anesthesia Quick Evaluation

## 2012-12-17 NOTE — Preoperative (Signed)
Beta Blockers   Reason not to administer Beta Blockers:Not Applicable 

## 2012-12-18 LAB — CBC
HCT: 29.9 % — ABNORMAL LOW (ref 36.0–46.0)
MCV: 92 fL (ref 78.0–100.0)
Platelets: 279 10*3/uL (ref 150–400)
RBC: 3.25 MIL/uL — ABNORMAL LOW (ref 3.87–5.11)
RDW: 13.6 % (ref 11.5–15.5)
WBC: 14.4 10*3/uL — ABNORMAL HIGH (ref 4.0–10.5)

## 2012-12-18 LAB — BASIC METABOLIC PANEL
CO2: 26 mEq/L (ref 19–32)
Chloride: 102 mEq/L (ref 96–112)
GFR calc Af Amer: >90 mL/min (ref >90)
Potassium: 3.9 mEq/L (ref 3.5–5.1)

## 2012-12-18 MED FILL — Heparin Sodium (Porcine) Inj 1000 Unit/ML: INTRAMUSCULAR | Qty: 30 | Status: AC

## 2012-12-18 MED FILL — Sodium Chloride IV Soln 0.9%: INTRAVENOUS | Qty: 1000 | Status: AC

## 2012-12-18 NOTE — Progress Notes (Signed)
Subjective: Patient reports Moving about quite well  Objective: Vital signs in last 24 hours: Temp:  [97.6 F (36.4 C)-99 F (37.2 C)] 99 F (37.2 C) (08/12 1658) Pulse Rate:  [64-97] 74 (08/12 1658) Resp:  [16-18] 16 (08/12 1658) BP: (107-148)/(71-82) 119/78 mmHg (08/12 1658) SpO2:  [92 %-99 %] 92 % (08/12 1658)  Intake/Output from previous day: 08/11 0701 - 08/12 0700 In: 2000 [I.V.:2000] Out: 1350 [Urine:1050; Blood:300] Intake/Output this shift:    Incision is clean and dry motor function intact  Lab Results:  Recent Labs  12/18/12 0430  WBC 14.4*  HGB 10.5*  HCT 29.9*  PLT 279   BMET  Recent Labs  12/18/12 0430  NA 138  K 3.9  CL 102  CO2 26  GLUCOSE 189*  BUN 8  CREATININE 0.55  CALCIUM 8.7    Studies/Results: Dg Lumbar Spine 2-3 Views  12/17/2012   *RADIOLOGY REPORT*  Clinical Data: Two level lumbar usual.  DG C-ARM 1-60 MIN,LUMBAR SPINE - 2-3 VIEW  Technique: AP and lateral intraoperative fluoroscopic spot films.  Comparison:  10/31/2012.  Findings: Two intraoperative fluoroscopic spot films demonstrate posterior rod and pedicle screw fixation from L3-L5.  L5-S1 ray cage fusion noted.  Anterior fusion apparatus appears similar to the myelogram.  IMPRESSION: P L I F L3-L5.   Original Report Authenticated By: Andreas Newport, M.D.   Dg Lumbar Spine 2-3 Views  12/17/2012   *RADIOLOGY REPORT*  Clinical Data: L3-L4 and L4-L5 laminectomy and PLIF.  LUMBAR SPINE - 2-3 VIEW  Comparison: Lumbar myelogram CT 10/31/2012.  Findings: Prior studies demonstrate transitional lumbosacral anatomy with a partially lumbarized S1 segment.  Ray cages are present at L5-S1.  Anterior fusion has been performed at L3-L4 and L4-L5.  Cross-table lateral view labeled #1 at 1145 hours demonstrates skin spreaders posteriorly with blunt surgical instruments overlapping the L2, L3 and L4 spinous processes.  Image #2 at 1152 hours demonstrates skin spreaders posteriorly at L2-L3.  There are  blunt surgical instruments overlying the L3 and L4 spinous processes.  IMPRESSION: Intraoperative localization views as described.   Original Report Authenticated By: Carey Bullocks, M.D.   Dg C-arm 1-60 Min  12/17/2012   *RADIOLOGY REPORT*  Clinical Data: Two level lumbar usual.  DG C-ARM 1-60 MIN,LUMBAR SPINE - 2-3 VIEW  Technique: AP and lateral intraoperative fluoroscopic spot films.  Comparison:  10/31/2012.  Findings: Two intraoperative fluoroscopic spot films demonstrate posterior rod and pedicle screw fixation from L3-L5.  L5-S1 ray cage fusion noted.  Anterior fusion apparatus appears similar to the myelogram.  IMPRESSION: P L I F L3-L5.   Original Report Authenticated By: Andreas Newport, M.D.    Assessment/Plan: Stable postop  LOS: 1 day  Continue to mobilize plan discharge in a.m.   Jonanthan Bolender J 12/18/2012, 7:50 PM

## 2012-12-18 NOTE — Evaluation (Signed)
Occupational Therapy Evaluation Patient Details Name: [name] MRN: [id number] DOB: 1945-10-26 Today's Date: 12/18/2012 Time: 5621-3086 OT Time Calculation (min): 12 min  OT Assessment / Plan / Recommendation History of present illness Lumbar stenosis L3-4 L4-5 status post anterior lumbar interbody arthrodesis with pseudoarthrosis L3-4 L4-5, lumbar radiculopathy, status post arthrodesis L5-S1   Clinical Impression   Pt presents to OT s/p back surgery. All education completed regarding ADL activity post back surgery   OT Assessment  Patient does not need any further OT services;All further OT needs can be met in the next venue of care    Follow Up Recommendations  Home health OT                Precautions / Restrictions Restrictions Weight Bearing Restrictions: No       ADL  Grooming: Performed Where Assessed - Grooming: Unsupported standing Upper Body Dressing: Performed;Set up Where Assessed - Upper Body Dressing: Unsupported sitting Lower Body Dressing: Performed;Minimal assistance Where Assessed - Lower Body Dressing: Unsupported sit to stand Toilet Transfer: Performed;Supervision/safety Toilet Transfer Method: Sit to Barista: Comfort height toilet Toileting - Clothing Manipulation and Hygiene: Supervision/safety Where Assessed - Toileting Clothing Manipulation and Hygiene: Standing Transfers/Ambulation Related to ADLs: Educated pt on back precautions and ADL activity . Pt has a Sports administrator and family that will A with ADL activity        Visit Information  Last OT Received On: 12/18/12 Assistance Needed: +1 History of Present Illness: Lumbar stenosis L3-4 L4-5 status post anterior lumbar interbody arthrodesis with pseudoarthrosis L3-4 L4-5, lumbar radiculopathy, status post arthrodesis L5-S1       Prior Functioning     Home Living Family/patient expects to be discharged to:: Private residence Living Arrangements:  Children Available Help at Discharge: Family Type of Home: House Home Layout: Two level Alternate Level Stairs-Number of Steps: 17 steps Home Equipment: Walker - 2 wheels;Cane - single point Additional Comments: reacher Prior Function Level of Independence: Independent Communication Communication: No difficulties         Vision/Perception Vision - History Patient Visual Report: No change from baseline   Cognition  Cognition Arousal/Alertness: Awake/alert Behavior During Therapy: WFL for tasks assessed/performed Overall Cognitive Status: Within Functional Limits for tasks assessed    Extremity/Trunk Assessment Upper Extremity Assessment Upper Extremity Assessment: Overall WFL for tasks assessed     Mobility Bed Mobility Bed Mobility: Left Sidelying to Sit Left Sidelying to Sit: 5: Supervision;With rails;HOB flat Transfers Transfers: Sit to Stand;Stand to Sit Sit to Stand: 5: Supervision;From bed;From chair/3-in-1;From toilet;With upper extremity assist Stand to Sit: 5: Supervision;To toilet;To chair/3-in-1;With upper extremity assist           End of Session OT - End of Session Equipment Utilized During Treatment: Back brace Activity Tolerance: Patient tolerated treatment well Patient left: in chair  GO     Evalene Vath, [place] Kung 12/18/2012, 11:45 AM

## 2012-12-18 NOTE — Anesthesia Postprocedure Evaluation (Signed)
Anesthesia Post Note  Patient: [name]  Procedure(s) Performed: Procedure(s) (LRB): Laminectomy Lumbar three-four,partial Lumbar five,Decompression lumbar three-four,Lumbar five nerve roots;Posterolateral Arthrodesis segmental fixation Lumbar three-Lumbar five. (N/A)  Anesthesia type: general  Patient location: PACU  Post pain: Pain level controlled  Post assessment: Patient's Cardiovascular Status Stable  Post vital signs: Reviewed and stable  Level of consciousness: sedated  Complications: No apparent anesthesia complications

## 2012-12-18 NOTE — Evaluation (Signed)
Physical Therapy Evaluation Patient Details Name: [name] MRN: [id number] DOB: 09/28/1945 Today's Date: 12/18/2012 Time: 4098-1191 PT Time Calculation (min): 27 min  PT Assessment / Plan / Recommendation History of Present Illness  Lumbar stenosis L3-4 L4-5 status post anterior lumbar interbody arthrodesis with pseudoarthrosis L3-4 L4-5, lumbar radiculopathy, status post arthrodesis L5-S1  Clinical Impression  Pt very motivated to return to PLOF and is very nearly at baseline.  Pt indicates multiple back surgeries in the past and is able to verbalize back precautions and demo safe technique for mobility.  No further PT needs at this time.  Will sign off.      PT Assessment  Patent does not need any further PT services    Follow Up Recommendations  No PT follow up;Supervision - Intermittent    Does the patient have the potential to tolerate intense rehabilitation      Barriers to Discharge        Equipment Recommendations  None recommended by PT    Recommendations for Other Services     Frequency      Precautions / Restrictions Precautions Precautions: Back Precaution Booklet Issued: Yes (comment) Precaution Comments: pt has had previous back surgeries and able to verbalize back precautions.   Required Braces or Orthoses: Spinal Brace Spinal Brace: Lumbar corset;Applied in sitting position Restrictions Weight Bearing Restrictions: No   Pertinent Vitals/Pain "Not bad"      Mobility  Bed Mobility Bed Mobility: Rolling Right;Right Sidelying to Sit;Sitting - Scoot to Edge of Bed Rolling Right: 6: Modified independent (Device/Increase time) Right Sidelying to Sit: 6: Modified independent (Device/Increase time) Left Sidelying to Sit: 5: Supervision;With rails;HOB flat Sitting - Scoot to Edge of Bed: 6: Modified independent (Device/Increase time) Details for Bed Mobility Assistance: pt demos good technique.   Transfers Transfers: Sit to Stand;Stand to Sit Sit to  Stand: 6: Modified independent (Device/Increase time);With upper extremity assist;From bed Stand to Sit: 6: Modified independent (Device/Increase time);With upper extremity assist;To bed Details for Transfer Assistance: demos good technique.   Ambulation/Gait Ambulation/Gait Assistance: 5: Supervision Ambulation Distance (Feet): 500 Feet Assistive device: Straight cane Ambulation/Gait Assistance Details: pt with leg length discrepancy and normally wears built up L shoe, otherwise moving well.   Gait Pattern: Step-through pattern;Decreased stride length Stairs: No Wheelchair Mobility Wheelchair Mobility: No    Exercises     PT Diagnosis:    PT Problem List:   PT Treatment Interventions:       PT Goals(Current goals can be found in the care plan section) Acute Rehab PT Goals Patient Stated Goal: Home  Visit Information  Last PT Received On: 12/18/12 Assistance Needed: +1 History of Present Illness: Lumbar stenosis L3-4 L4-5 status post anterior lumbar interbody arthrodesis with pseudoarthrosis L3-4 L4-5, lumbar radiculopathy, status post arthrodesis L5-S1       Prior Functioning  Home Living Family/patient expects to be discharged to:: Private residence Living Arrangements: Children Available Help at Discharge: Family;Available 24 hours/day Type of Home: House Home Access: Ramped entrance Home Layout: Two level;Able to live on main level with bedroom/bathroom Alternate Level Stairs-Number of Steps: 17 steps Home Equipment: Walker - 2 wheels;Cane - single point Additional Comments: reacher Prior Function Level of Independence: Independent Communication Communication: No difficulties    Cognition  Cognition Arousal/Alertness: Awake/alert Behavior During Therapy: WFL for tasks assessed/performed Overall Cognitive Status: Within Functional Limits for tasks assessed    Extremity/Trunk Assessment Upper Extremity Assessment Upper Extremity Assessment: Defer to OT  evaluation Lower Extremity Assessment Lower Extremity  Assessment: Overall WFL for tasks assessed Cervical / Trunk Assessment Cervical / Trunk Assessment: Normal   Balance Balance Balance Assessed: No  End of Session PT - End of Session Equipment Utilized During Treatment: Back brace Activity Tolerance: Patient tolerated treatment well Patient left: in bed;with call bell/phone within reach Nurse Communication: Mobility status  GP     Sunny Schlein, [place] [phone] 12/18/2012, 2:06 PM

## 2012-12-18 NOTE — Progress Notes (Signed)
Orthopedic Tech Progress Note Patient Details:  [name] [birth year] [id number] Brace completed by [employer] vendor Patient ID: [name], female   DOB: [birth year], 67 y.o.   MRN: [id number]   Jennye Moccasin 12/18/2012, 7:16 PM

## 2012-12-18 NOTE — Progress Notes (Signed)
Orthopedic Tech Progress Note Patient Details:  [name] Apr 21, 1946 [id number] Brace order placed with [employer] Patient ID: [name], female   DOB: [birth year], 67 y.o.   MRN: [id number]   [name] 12/18/2012, 9:23 AM

## 2012-12-19 MED ORDER — METHOCARBAMOL 500 MG PO TABS: 500.0000 mg | ORAL_TABLET | Freq: Four times a day (QID) | ORAL | Status: DC | PRN

## 2012-12-19 MED ORDER — OXYCODONE-ACETAMINOPHEN 5-325 MG PO TABS: 1.0000 | ORAL_TABLET | ORAL | Status: DC | PRN

## 2012-12-19 NOTE — Progress Notes (Signed)
Pt doing well. Pt given D/C instructions with Rx's, verbal understanding of teaching given. All questions were answered prior to D/C. Pt D/C'd home via wheelchair @ 1055 per MD order. Rema Fendt, RN

## 2012-12-19 NOTE — Discharge Summary (Signed)
Physician Discharge Summary  Patient ID: [name] MRN: [id number] DOB/AGE: Feb 02, 1946 67 y.o.  Admit date: 12/17/2012 Discharge date: 12/19/2012  Admission Diagnoses: Pseudoarthrosis of anterior lumbar interbody arthrodesis L3-4 L4-5, lumbar stenosis, lumbar radiculopathy  Discharge Diagnoses:  Pseudoarthrosis of anterior lumbar interbody arthrodesis L3-4 L4-5, lumbar stenosis, lumbar radiculopathy Principal Problem:   Pseudoarthrosis L3- L5   Discharged Condition: good  Hospital Course: Patient was omitted to undergo posterior surgical decompression and arthrodesis L3-L5. She tolerated the surgery well  Consults: None  Significant Diagnostic Studies: None  Treatments: surgery: Posterior decompression L3-L5 via laminectomy segmental fixation L3-L5 posterior lateral arthrodesis with local autograft and allograft  Discharge Exam: Blood pressure 110/65, pulse 94, temperature 98.9 F (37.2 C), temperature source Oral, resp. rate 18, SpO2 93.00%. Incision is clean and dry motor function is intact in lower extreme  Disposition: 01-Home or Self Care  Discharge Orders   Future Appointments Provider Department Dept Phone   02/14/2013 10:00 AM Ileana Ladd, MD [facility] [phone]   02/14/2013 10:30 AM Paola A. Duard Brady, MD [facility] GYNECOLOGICAL ON[facility] [phone]   Future Orders Complete By Expires   Call MD for:  redness, tenderness, or signs of infection (pain, swelling, redness, odor or green/yellow discharge around incision site)  As directed    Call MD for:  severe uncontrolled pain  As directed    Call MD for:  temperature >100.4  As directed    Diet - low sodium heart healthy  As directed    Discharge instructions  As directed    Comments:     Okay to shower. Do not apply salves or appointments to incision. No heavy lifting with the upper extremities greater than 15 pounds. May resume driving when not requiring pain  medication and patient feels comfortable with doing so.   Increase activity slowly  As directed        Medication List         amitriptyline 25 MG tablet  Commonly known as:  ELAVIL  Take 1 tablet (25 mg total) by mouth at bedtime.     docusate sodium 100 MG capsule  Commonly known as:  COLACE  Take 100 mg by mouth at bedtime.     fish oil-omega-3 fatty acids 1000 MG capsule  Take 1 g by mouth daily.     gabapentin 300 MG capsule  Commonly known as:  NEURONTIN  Take 1 capsule (300 mg total) by mouth at bedtime.     lisinopril-hydrochlorothiazide 20-12.5 MG per tablet  Commonly known as:  PRINZIDE,ZESTORETIC  Take 1 tablet by mouth every morning.     meloxicam 15 MG tablet  Commonly known as:  MOBIC  Take 1 tablet (15 mg total) by mouth every morning.     methocarbamol 500 MG tablet  Commonly known as:  ROBAXIN  Take 1 tablet (500 mg total) by mouth every 6 (six) hours as needed (Muscle spasm).     oxyCODONE-acetaminophen 5-325 MG per tablet  Commonly known as:  PERCOCET/ROXICET  Take 1-2 tablets by mouth every 4 (four) hours as needed for pain.     simvastatin 20 MG tablet  Commonly known as:  ZOCOR  Take 1 tablet (20 mg total) by mouth at bedtime.         SignedStefani Dama 12/19/2012, 8:37 AM

## 2012-12-20 NOTE — Progress Notes (Signed)
Utilization review completed. Anona Giovannini, RN, BSN. 

## 2012-12-21 NOTE — Progress Notes (Signed)
Late entry for missed g-code.     01/08/13 1405  PT G-Codes **NOT FOR INPATIENT CLASS**  Functional Assessment Tool Used Clinical Judgement  Functional Limitation Mobility: Walking and moving around  Mobility: Walking and Moving Around Current Status (W0981) [facility]  Mobility: Walking and Moving Around Goal Status [phone]) [facility]  Mobility: Walking and Moving Around Discharge Status [phone]) [facility]   Marshea Wisher, PT [phone]

## 2012-12-26 ENCOUNTER — Ambulatory Visit: Payer: Medicare Other | Admitting: Gynecologic Oncology

## 2013-02-14 ENCOUNTER — Ambulatory Visit: Payer: Medicare Other | Attending: Gynecologic Oncology | Admitting: Gynecologic Oncology

## 2013-02-14 ENCOUNTER — Ambulatory Visit: Payer: Medicare Other | Admitting: Family Medicine

## 2013-02-14 ENCOUNTER — Encounter: Payer: Self-pay | Admitting: Gynecologic Oncology

## 2013-02-14 ENCOUNTER — Other Ambulatory Visit (HOSPITAL_COMMUNITY)
Admission: RE | Admit: 2013-02-14 | Discharge: 2013-02-14 | Disposition: A | Discharge status: Home / Self Care | Payer: Medicare Other | Source: Ambulatory Visit | Attending: Gynecologic Oncology | Admitting: Gynecologic Oncology

## 2013-02-14 VITALS — BP 158/80 | HR 52 | Temp 97.7°F | Resp 18 | Ht 60.0 in | Wt 196.7 lb

## 2013-02-14 DIAGNOSIS — Z124 Encounter for screening for malignant neoplasm of cervix: Secondary | ICD-10-CM | POA: Insufficient documentation

## 2013-02-14 DIAGNOSIS — I10 Essential (primary) hypertension: Secondary | ICD-10-CM | POA: Insufficient documentation

## 2013-02-14 DIAGNOSIS — Z96659 Presence of unspecified artificial knee joint: Secondary | ICD-10-CM | POA: Insufficient documentation

## 2013-02-14 DIAGNOSIS — Z96649 Presence of unspecified artificial hip joint: Secondary | ICD-10-CM | POA: Insufficient documentation

## 2013-02-14 DIAGNOSIS — Z90710 Acquired absence of both cervix and uterus: Secondary | ICD-10-CM | POA: Insufficient documentation

## 2013-02-14 DIAGNOSIS — K5900 Constipation, unspecified: Secondary | ICD-10-CM | POA: Insufficient documentation

## 2013-02-14 DIAGNOSIS — C541 Malignant neoplasm of endometrium: Secondary | ICD-10-CM

## 2013-02-14 DIAGNOSIS — Z9079 Acquired absence of other genital organ(s): Secondary | ICD-10-CM | POA: Insufficient documentation

## 2013-02-14 DIAGNOSIS — C549 Malignant neoplasm of corpus uteri, unspecified: Secondary | ICD-10-CM | POA: Insufficient documentation

## 2013-02-14 DIAGNOSIS — E785 Hyperlipidemia, unspecified: Secondary | ICD-10-CM | POA: Insufficient documentation

## 2013-02-14 DIAGNOSIS — Z8542 Personal history of malignant neoplasm of other parts of uterus: Secondary | ICD-10-CM

## 2013-02-14 DIAGNOSIS — E669 Obesity, unspecified: Secondary | ICD-10-CM | POA: Insufficient documentation

## 2013-02-14 NOTE — Progress Notes (Signed)
Follow Up Note: Gyn-Onc  [name] 67 y.o. female  CC:  Chief Complaint  Patient presents with  . Endomtrial cancer    Follow up    HPI:  [name] is a very pleasant 67 year old who initially presented with postmenopausal bleeding. She was seen by Dr. Ralph Dowdy and an endometrial biopsy was performed that revealed a grade 1 endometrioid adenocarcinoma. She was referred to GYN Oncology.  After discussion of risks and benefits, she opted for surgery and on November 02, 2010 underwent diagnostic laparoscopy conversion to TAH-BSO, lysis of adhesions for 20 minutes, and incisional hernia repair.  OPERATIVE FINDINGS: Included significant adhesive disease of the omentum and anterior abdominal wall, small bowel to the anterior abdominal wall, and a globular fibroid uterus. She had a 3-cm incisional hernia. Frozen section revealed minimal myometrial invasion. Fortunately, final pathology also revealed minimal myometrial invasion. She had a grade 1 endometrioid adenocarcinoma with focal superficial myometrial invasion of 0.1 cm with a myometrium of 1.8 cm or approximately 7%. She had a 5-cm tumor. No lymphovascular space Involvement.   Interval History:  She presents for continued follow up.  She had surgery in March of this year with Dr. Domingo Pulse. Pathology was benign. She had significant adhesive disease and a ventral hernia that was repaired. The surgery was in March and she feels that in May she might have a new bed. She has not seen Dr. gross again. She's not sure with a recurrent hernia. She also had a back fusion done 12/17/2012. Her pain is significantly better from that perspective. Should a urinary tract infection in June. Other than taking in about antibiotics for that, she has not been on long-term antibiotics. She developed a rash under her breast about 6 weeks ago. We had given her nystatin powder in the  past that helps. She states she has been using it twice a day and has not really benefiting her now. She's also used Tremeka Helbling creams Neosporin and D. ointment without any benefit. She's not seen her primary care physician for this. She stay she's a similar area in her right groin. She denies any vaginal bleeding. She does suffer from constipation for which she takes laxatives. She is up-to-date on her mammograms and is due for a followup 6 month imaging on the left breast in December. That is at rescheduled. We have not seen her since February 2014 and I last saw her in August of 2013 at which time her exam and Pap smear were negative. She comes in today for followup.   Review of Systems  Constitutional: Denies fever. Skin: No rash, sores, jaundice, itching, or dryness.  Cardiovascular: No chest pain, shortness of breath, or edema  Pulmonary: No cough or wheeze.  Gastro Intestinal:  No nausea, vomiting, constipation, or diarrhea reported. No bright red blood per rectum or change in bowel movement.  Genitourinary: No frequency, urgency, or dysuria.  Denies vaginal bleeding and discharge.  Musculoskeletal: No myalgia, arthralgia, joint swelling or pain.  Neurologic: No weakness, numbness, or change in gait.  Psychology: No changes.   Current Meds:  Outpatient Encounter Prescriptions as of 02/14/2013  Medication Sig Dispense Refill  . amitriptyline (ELAVIL) 25 MG tablet Take 1 tablet (25 mg total) by mouth at bedtime.  90 tablet  3  . docusate sodium (COLACE) 100 MG capsule Take 100 mg by mouth at bedtime.      . fish oil-omega-3 fatty acids 1000 MG capsule Take 1 g by mouth daily.      Marland Kitchen  gabapentin (NEURONTIN) 300 MG capsule Take 1 capsule (300 mg total) by mouth at bedtime.  90 capsule  3  . HYDROcodone-acetaminophen (NORCO/VICODIN) 5-325 MG per tablet Take 1 tablet by mouth every 6 (six) hours as needed for pain. For recent back surgery (12/17/2012)      . lisinopril-hydrochlorothiazide  (PRINZIDE,ZESTORETIC) 20-12.5 MG per tablet Take 1 tablet by mouth every morning.  90 tablet  3  . meloxicam (MOBIC) 15 MG tablet Take 1 tablet (15 mg total) by mouth every morning.  90 tablet  3  . simvastatin (ZOCOR) 20 MG tablet Take 1 tablet (20 mg total) by mouth at bedtime.  90 tablet  3  . methocarbamol (ROBAXIN) 500 MG tablet Take 1 tablet (500 mg total) by mouth every 6 (six) hours as needed (Muscle spasm).  60 tablet  3  . oxyCODONE-acetaminophen (PERCOCET/ROXICET) 5-325 MG per tablet Take 1-2 tablets by mouth every 4 (four) hours as needed for pain.  60 tablet  0   No facility-administered encounter medications on file as of 02/14/2013.    Allergy:  Allergies  Allergen Reactions  . Cashew Nut Oil Hives  . Sulfamethoxazole     Mouth sores  . Zofran Hives and Nausea Only    Social Hx:   History   Social History  . Marital Status: Widowed    Spouse Name: N/A    Number of Children: N/A  . Years of Education: N/A   Occupational History  . Not on file.   Social History Main Topics  . Smoking status: Never Smoker   . Smokeless tobacco: Not on file  . Alcohol Use: No  . Drug Use: No  . Sexual Activity: No   Other Topics Concern  . Not on file   Social History Narrative  . No narrative on file    Past Surgical Hx:  Past Surgical History  Procedure Laterality Date  . Abdominal hysterectomy  11/02/10    TAH, BSO, lysis of adhesions for endometrial cancer  . Incisional hernia repair  11/02/10  . Back surgery  1989, 1999, 2009    anterior L2-3, L3-4 arthrodesis  . Kidney surgery  2010    Left kidney  . Bladder suspension  2003  . Foot surgery  06/2010    left foot surgery  . Joint replacement  09/2009    Right total knee  . Total hip arthroplasty  01/2010    Right total hip  . Cataract extraction, bilateral  2003  . Total knee arthroplasty      right  . Ureterolysis  2010    lap/open left ureterolysis & omental flap, ureter stent  . Ventral hernia repair N/A  07/19/2012    Procedure: LAPAROSCOPIC LYSIS OF ADHESIONS, SMALL BOWEL RESECTION, SEROSAL REPAIR, PRIMARY VENTRAL HERNIA REPAIR;  Surgeon: Ardeth Sportsman, MD;  Location: [facility];  Service: General;  Laterality: N/A;  . Hernia repair  07/19/12    lap VWH repair  . Eye surgery    . Back surgery  12/17/2012    Spinal fusion and repair     Past Medical Hx:  Past Medical History  Diagnosis Date  . Endometrioid adenocarcinoma   . Hypertension   . Obesity (BMI 30-39.9) 04/27/2012  . Arthritis   . Hyperlipidemia   . Ureteral adhesion, left 2010    s/p ureterolysis & stenting Dr. Dolores Lory now  . 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 Colon polyp, hyperplastic 04/27/2012  Colonoscopy 2010.  No adenomatous polyps   . History of endometrial cancer s/p TAH BSO June2013 06/15/2011    Grade 1 endometrioid adenocarcinoma with focal superficial myometrial invasion of 0.1 cm with a myometrium of 1.8 cm or approximately 7%. She had a 5-cm tumor. No lymphovascular space Involvement.    . Mass of in fold of jejunum s/p SB resection 07/19/2012 07/20/2012  . UTI (urinary tract infection)     Family Hx:  Family History  Problem Relation Age of Onset  . Diabetes Mother   . Cancer Mother     lung  . Diabetes Father   . Cancer Father     liver  . Colon cancer Sister   . Diabetes Sister   . Cancer Sister     colon    Vitals:  Blood pressure 158/80, pulse 52, temperature 97.7 F (36.5 C), temperature source Oral, resp. rate 18, height 5' (1.524 m), weight 196 lb 11.2 oz (89.223 kg).  Physical Exam:  General: Well developed, well nourished female in no acute distress. Alert and oriented x 3.  Using walking stick for support.   Head/ Neck: Oropharynx with light pink sore noted to the right lateral tongue.  Sclerae anicteric.  Supple without any enlargements.   Lymph node survey: No cervical, supraclavicular, or inguinal adenopathy.    Cardiovascular: Regular rate and rhythm.  Lungs: Clear to auscultation bilaterally.   Skin: No rashes or lesions present. Patches under bilateral breasts and in right groin consistent with Candida.  Back: No CVA tenderness.   Abdomen: Abdomen soft, non-tender and obese.  Active bowel sounds in all quadrants. No evidence of a fluid wave or abdominal masses.  No distension, tenderness, rigidity, or guarding. With Valsalva there is a prominence of the left anterior abdominal wall. It is difficult to ascertain if this is due to her diastasis and laxity versus a true hernia. There is a questionable small herniation on the left side of the incision near the umbilicus. It is difficult to determine based on the body habitus.  Genitourinary:    Vulva/vagina: Normal external female genitalia. No lesions.    Urethra: No lesions or masses    Vagina: Atrophic without any lesions. Pap smear submitted. No palpable masses. No vaginal bleeding or drainage noted.  Rectal: Good tone, no masses no cul de sac nodularity.   Extremities: No bilateral cyanosis or clubbing.  Mild, non-pitting edema noted with the left ankle.   Assessment/Plan:  [name] is a 67 year old with stage IA, grade 1 endometrioid adenocarcinoma, who requires no postoperative adjuvant therapy.  She has been without evidence of recurrence since 2012.  We'll notify her of the results of her Pap smear. She was given a prescription for Lotrisone to apply under the breasts as well as in her bilateral groins twice daily. She states she's not had diabetes and has had A1c checked in the past that have shown her to not be diabetic. She has followup with her primary care physician in a few weeks. We also encourage her to contact Dr. Michaell Cowing to schedule followup appointment for evaluation of a questionable recurrent hernia versus a diastasis She is to follow up with [place] in 6 months.  Bisma Klett A., MD 02/14/2013, 10:27 AM

## 2013-02-14 NOTE — Patient Instructions (Signed)
Return to clinic in 6 months.

## 2013-02-15 NOTE — Progress Notes (Signed)
Please check and see if this patient has an appointment with me for evaluation

## 2013-02-18 ENCOUNTER — Telehealth: Payer: Self-pay | Admitting: Gynecologic Oncology

## 2013-02-18 NOTE — Telephone Encounter (Signed)
Pt notified about pap results: negative.  No questions or concerns voiced. 

## 2013-02-21 ENCOUNTER — Ambulatory Visit (INDEPENDENT_AMBULATORY_CARE_PROVIDER_SITE_OTHER): Payer: Medicare Other

## 2013-02-21 ENCOUNTER — Ambulatory Visit (INDEPENDENT_AMBULATORY_CARE_PROVIDER_SITE_OTHER): Payer: Medicare Other | Admitting: Family Medicine

## 2013-02-21 ENCOUNTER — Encounter: Payer: Self-pay | Admitting: Family Medicine

## 2013-02-21 ENCOUNTER — Encounter (INDEPENDENT_AMBULATORY_CARE_PROVIDER_SITE_OTHER): Payer: Self-pay

## 2013-02-21 VITALS — BP 131/72 | HR 61 | Temp 97.8°F | Ht 61.0 in | Wt 196.4 lb

## 2013-02-21 DIAGNOSIS — K6389 Other specified diseases of intestine: Secondary | ICD-10-CM

## 2013-02-21 DIAGNOSIS — Z8542 Personal history of malignant neoplasm of other parts of uterus: Secondary | ICD-10-CM

## 2013-02-21 DIAGNOSIS — E669 Obesity, unspecified: Secondary | ICD-10-CM

## 2013-02-21 DIAGNOSIS — K432 Incisional hernia without obstruction or gangrene: Secondary | ICD-10-CM

## 2013-02-21 DIAGNOSIS — G8929 Other chronic pain: Secondary | ICD-10-CM

## 2013-02-21 DIAGNOSIS — S32009S Unspecified fracture of unspecified lumbar vertebra, sequela: Secondary | ICD-10-CM

## 2013-02-21 DIAGNOSIS — K635 Polyp of colon: Secondary | ICD-10-CM | POA: Insufficient documentation

## 2013-02-21 DIAGNOSIS — K56609 Unspecified intestinal obstruction, unspecified as to partial versus complete obstruction: Secondary | ICD-10-CM

## 2013-02-21 DIAGNOSIS — D126 Benign neoplasm of colon, unspecified: Secondary | ICD-10-CM

## 2013-02-21 DIAGNOSIS — S40012A Contusion of left shoulder, initial encounter: Secondary | ICD-10-CM

## 2013-02-21 DIAGNOSIS — IMO0002 Reserved for concepts with insufficient information to code with codable children: Secondary | ICD-10-CM

## 2013-02-21 DIAGNOSIS — S40029A Contusion of unspecified upper arm, initial encounter: Secondary | ICD-10-CM | POA: Insufficient documentation

## 2013-02-21 DIAGNOSIS — S40019A Contusion of unspecified shoulder, initial encounter: Secondary | ICD-10-CM

## 2013-02-21 DIAGNOSIS — K682 Retroperitoneal fibrosis: Secondary | ICD-10-CM | POA: Insufficient documentation

## 2013-02-21 DIAGNOSIS — I10 Essential (primary) hypertension: Secondary | ICD-10-CM

## 2013-02-21 DIAGNOSIS — R739 Hyperglycemia, unspecified: Secondary | ICD-10-CM

## 2013-02-21 DIAGNOSIS — G4762 Sleep related leg cramps: Secondary | ICD-10-CM

## 2013-02-21 DIAGNOSIS — N135 Crossing vessel and stricture of ureter without hydronephrosis: Secondary | ICD-10-CM

## 2013-02-21 DIAGNOSIS — E785 Hyperlipidemia, unspecified: Secondary | ICD-10-CM

## 2013-02-21 DIAGNOSIS — Z23 Encounter for immunization: Secondary | ICD-10-CM

## 2013-02-21 MED ORDER — AMITRIPTYLINE HCL 50 MG PO TABS: 50.0000 mg | ORAL_TABLET | Freq: Every day | ORAL | Status: DC

## 2013-02-21 NOTE — Progress Notes (Signed)
Patient ID: [name], female   DOB: [birth year], 67 y.o.   MRN: [id number] SUBJECTIVE: CC: Chief Complaint  Patient presents with  . Follow-up    refill amtriptyline states fell 6 weeks ago on left shoulder and verbalizes difficulty in raising arm.     HPI: Breakfast: grits, oatmeal, fried eggs and fat back Lunch: left over from supper. Supper: pinto beans BBQ ribs/stew beef, potatoes.  Patient is here for follow up of hyperlipidemia/HTN/endometrial cancer/obesity/ poor dietary habits.: denies Headache;denies Chest Pain;denies weakness;denies Shortness of Breath and orthopnea;denies Visual changes;denies palpitations;denies cough;denies pedal edema;denies symptoms of TIA or stroke;deniesClaudication symptoms. admits to Compliance with medications; denies Problems with medications.  Fell on the left shoulder 6 weeks ago. Did not have it evaluated.sore to lift the left arm. She can only raise it just above horizontal.no swelling. No neurologic  Deficits.  Bought the books on nutrition.   Past Medical History  Diagnosis Date  . Endometrioid adenocarcinoma   . Hypertension   . Obesity (BMI 30-39.9) 04/27/2012  . Arthritis   . Hyperlipidemia   . Ureteral adhesion, left 2010    s/p ureterolysis & stenting Dr. Dolores Lory now  . 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 Colon polyp, hyperplastic 04/27/2012    Colonoscopy 2010.  No adenomatous polyps   . History of endometrial cancer s/p TAH BSO June2013 06/15/2011    Grade 1 endometrioid adenocarcinoma with focal superficial myometrial invasion of 0.1 cm with a myometrium of 1.8 cm or approximately 7%. She had a 5-cm tumor. No lymphovascular space Involvement.    . Mass of in fold of jejunum s/p SB resection 07/19/2012 07/20/2012  . UTI (urinary tract infection)    Past Surgical History  Procedure Laterality Date  . Abdominal hysterectomy  11/02/10    TAH, BSO, lysis of  adhesions for endometrial cancer  . Incisional hernia repair  11/02/10  . Back surgery  1989, 1999, 2009    anterior L2-3, L3-4 arthrodesis  . Kidney surgery  2010    Left kidney  . Bladder suspension  2003  . Foot surgery  06/2010    left foot surgery  . Joint replacement  09/2009    Right total knee  . Total hip arthroplasty  01/2010    Right total hip  . Cataract extraction, bilateral  2003  . Total knee arthroplasty      right  . Ureterolysis  2010    lap/open left ureterolysis & omental flap, ureter stent  . Ventral hernia repair N/A 07/19/2012    Procedure: LAPAROSCOPIC LYSIS OF ADHESIONS, SMALL BOWEL RESECTION, SEROSAL REPAIR, PRIMARY VENTRAL HERNIA REPAIR;  Surgeon: Ardeth Sportsman, MD;  Location: [facility];  Service: General;  Laterality: N/A;  . Hernia repair  07/19/12    lap VWH repair  . Eye surgery    . Back surgery  12/17/2012    Spinal fusion and repair    History   Social History  . Marital Status: Widowed    Spouse Name: N/A    Number of Children: N/A  . Years of Education: N/A   Occupational History  . Not on file.   Social History Main Topics  . Smoking status: Never Smoker   . Smokeless tobacco: Not on file  . Alcohol Use: No  . Drug Use: No  . Sexual Activity: No   Other Topics Concern  . Not on file   Social History Narrative  . No narrative on  file   Family History  Problem Relation Age of Onset  . Diabetes Mother   . Cancer Mother     lung  . Diabetes Father   . Cancer Father     liver  . Colon cancer Sister   . Diabetes Sister   . Cancer Sister     colon   Current Outpatient Prescriptions on File Prior to Visit  Medication Sig Dispense Refill  . docusate sodium (COLACE) 100 MG capsule Take 100 mg by mouth at bedtime.      . fish oil-omega-3 fatty acids 1000 MG capsule Take 1 g by mouth daily.      Marland Kitchen gabapentin (NEURONTIN) 300 MG capsule Take 1 capsule (300 mg total) by mouth at bedtime.  90 capsule  3  . HYDROcodone-acetaminophen  (NORCO/VICODIN) 5-325 MG per tablet Take 1 tablet by mouth every 6 (six) hours as needed for pain. For recent back surgery (12/17/2012)      . lisinopril-hydrochlorothiazide (PRINZIDE,ZESTORETIC) 20-12.5 MG per tablet Take 1 tablet by mouth every morning.  90 tablet  3  . meloxicam (MOBIC) 15 MG tablet Take 1 tablet (15 mg total) by mouth every morning.  90 tablet  3  . simvastatin (ZOCOR) 20 MG tablet Take 1 tablet (20 mg total) by mouth at bedtime.  90 tablet  3   No current facility-administered medications on file prior to visit.   Allergies  Allergen Reactions  . Cashew Nut Oil Hives  . Sulfamethoxazole     Mouth sores  . Zofran Hives and Nausea Only  . Zofran [Ondansetron Hcl]    Immunization History  Administered Date(s) Administered  . Influenza,inj,Quad PF,36+ Mos 02/21/2013  . Pneumococcal Polysaccharide 05/09/2010  . Tdap 03/09/2012  . Zoster 05/09/2010   Prior to Admission medications   Medication Sig Start Date End Date Taking? Authorizing Provider  amitriptyline (ELAVIL) 25 MG tablet Take 1 tablet (25 mg total) by mouth at bedtime. 08/10/12  Yes Ileana Ladd, MD  docusate sodium (COLACE) 100 MG capsule Take 100 mg by mouth at bedtime.   Yes Historical Provider, MD  fish oil-omega-3 fatty acids 1000 MG capsule Take 1 g by mouth daily.   Yes Historical Provider, MD  gabapentin (NEURONTIN) 300 MG capsule Take 1 capsule (300 mg total) by mouth at bedtime. 08/10/12  Yes Ileana Ladd, MD  HYDROcodone-acetaminophen (NORCO/VICODIN) 5-325 MG per tablet Take 1 tablet by mouth every 6 (six) hours as needed for pain. For recent back surgery (12/17/2012)   Yes Historical Provider, MD  lisinopril-hydrochlorothiazide (PRINZIDE,ZESTORETIC) 20-12.5 MG per tablet Take 1 tablet by mouth every morning. 08/10/12  Yes Ileana Ladd, MD  meloxicam (MOBIC) 15 MG tablet Take 1 tablet (15 mg total) by mouth every morning. 08/10/12  Yes Ileana Ladd, MD  simvastatin (ZOCOR) 20 MG tablet Take 1 tablet  (20 mg total) by mouth at bedtime. 08/15/12  Yes Ileana Ladd, MD     ROS: As above in the HPI. All other systems are stable or negative.  OBJECTIVE: APPEARANCE:  Patient in no acute distress.The patient appeared well nourished.. Acyanotic. Waist: VITAL SIGNS:BP 131/72  Pulse 61  Temp(Src) 97.8 F (36.6 C) (Oral)  Ht 5\' 1"  (1.549 m)  Wt 196 lb 6.4 oz (89.086 kg)  BMI 37.13 kg/m2 Obese WF  SKIN: warm and  Dry without overt rashes, tattoos.  HEAD and Neck: without JVD, Head and scalp: normal Eyes:No scleral icterus. Fundi normal, eye movements normal. Ears: Auricle normal, canal  normal, Tympanic membranes normal, insufflation normal. Nose: normal Throat: normal Neck & thyroid: normal  CHEST & LUNGS: Chest wall: normal Lungs: Clear  CVS: Reveals the PMI to be normally located. Regular rhythm, First and Second Heart sounds are normal,  absence of murmurs, rubs or gallops. Peripheral vasculature: Radial pulses: normal Dorsal pedis pulses: normal Posterior pulses: normal  ABDOMEN:  Appearance: obese, scarred from previous abdominal surgeries. Benign, no organomegaly, no masses, no Abdominal Aortic enlargement. No Guarding , no rebound. No Bruits. Bowel sounds: normal  RECTAL: N/A GU: N/A  EXTREMETIES: nonedematous.  MUSCULOSKELETAL:  Spine: normal Joints: left shoulder abducts to 110 degrees. Pain on initiating abduction. Pain on internal rotation. No pain with external rotation. No deformity. No specific point tenderness.  NEUROLOGIC: oriented to time,place and person; nonfocal. Strength is normal Sensory is normal Reflexes are normal Cranial Nerves are normal.  ASSESSMENT: HLD (hyperlipidemia) - Plan: CMP14+EGFR, Lipid panel  Obesity (BMI 30-39.9)  Hypertension - Plan: CMP14+EGFR  Need for prophylactic vaccination and inoculation against influenza  Recurrent ventral incisional hernia s/p primary repair 07/19/2012  SBO (small bowel obstruction) -  recurrent  History of endometrial cancer s/p TAH BSO June2013  Chronic pain - Plan: amitriptyline (ELAVIL) 50 MG tablet  Pseudoarthrosis of lumbar spine, sequela  Nocturnal leg cramps  Mass of in fold of jejunum s/p SB resection 07/19/2012  Colon polyp, hyperplastic  Retroperitoneal fibrosis in setting of chronic abscess at ureter 2010  Contusion shoulder/arm, left, initial encounter - Plan: DG Shoulder Left  PLAN:  Orders Placed This Encounter  Procedures  . DG Shoulder Left    Standing Status: Future     Number of Occurrences: 1     Standing Expiration Date: 04/23/2014    Order Specific Question:  Reason for Exam (SYMPTOM  OR DIAGNOSIS REQUIRED)    Answer:  fell on shoulder ,    Order Specific Question:  Preferred imaging location?    Answer:  Internal  . CMP14+EGFR  . Lipid panel   [facility] reading (PRIMARY) by  Dr. Modesto Charon: calcific nidus at the supraspinatus tendon area. Possible supraspinatus calcific tendonitis? Await radiology reading. No fractures seen.                                 Meds ordered this encounter  Medications  . amitriptyline (ELAVIL) 50 MG tablet    Sig: Take 1 tablet (50 mg total) by mouth at bedtime.    Dispense:  90 tablet    Refill:  3   Medications Discontinued During This Encounter  Medication Reason  . methocarbamol (ROBAXIN) 500 MG tablet Completed Course  . oxyCODONE-acetaminophen (PERCOCET/ROXICET) 5-325 MG per tablet Completed Course  . amitriptyline (ELAVIL) 25 MG tablet Reorder  reinforced  Diet and exercise and need to obtain ideal weight.  Discuss the nutrition advice/recommendations. Demonstrated rotator cuff / shoulder exercises. Call if she would like to attend PT on th eleft shoulder. Ice after exercises. And icy hot/ben gays application. Discuss the doppler studies/screens that she plans to pay for and have on her carotids and aorta and legs. The recommendations that are current.  Return in about 4 months (around 06/24/2013)  for Recheck medical problems.  Ezio Wieck P. Modesto Charon, M.D.

## 2013-02-22 LAB — LIPID PANEL
Chol/HDL Ratio: 1.9 ratio units (ref 0.0–4.4)
Cholesterol, Total: 183 mg/dL (ref 100–199)
HDL: 98 mg/dL (ref >39)
LDL Calculated: 71 mg/dL (ref 0–99)
Triglycerides: 72 mg/dL (ref 0–149)
VLDL Cholesterol Cal: 14 mg/dL (ref 5–40)

## 2013-02-22 LAB — CMP14+EGFR
ALT: 11 IU/L (ref 0–32)
AST: 15 IU/L (ref 0–40)
Albumin/Globulin Ratio: 1.9 (ref 1.1–2.5)
Albumin: 4.7 g/dL (ref 3.6–4.8)
Alkaline Phosphatase: 76 IU/L (ref 39–117)
BUN/Creatinine Ratio: 20 (ref 11–26)
BUN: 16 mg/dL (ref 8–27)
CO2: 24 mmol/L (ref 18–29)
Calcium: 10.2 mg/dL (ref 8.6–10.2)
Chloride: 97 mmol/L (ref 97–108)
Creatinine, Ser: 0.8 mg/dL (ref 0.57–1.00)
GFR calc Af Amer: 88 mL/min/{1.73_m2} (ref >59)
GFR calc non Af Amer: 76 mL/min/{1.73_m2} (ref >59)
Globulin, Total: 2.5 g/dL (ref 1.5–4.5)
Glucose: 114 mg/dL — ABNORMAL HIGH (ref 65–99)
Potassium: 4.6 mmol/L (ref 3.5–5.2)
Sodium: 139 mmol/L (ref 134–144)
Total Bilirubin: 0.4 mg/dL (ref 0.0–1.2)
Total Protein: 7.2 g/dL (ref 6.0–8.5)

## 2013-02-23 NOTE — Progress Notes (Signed)
Quick Note:  Call Patient Labs abnormal: Sugar high Recommendations: Will do a HGBA1C to check for DM The rest of the labs was good. No other changes for now.   ______

## 2013-02-23 NOTE — Addendum Note (Signed)
Addended by: Ileana Ladd on: 02/23/2013 10:25 AM   Modules accepted: Orders

## 2013-02-27 ENCOUNTER — Telehealth: Payer: Self-pay | Admitting: Family Medicine

## 2013-03-05 ENCOUNTER — Ambulatory Visit (INDEPENDENT_AMBULATORY_CARE_PROVIDER_SITE_OTHER): Payer: Medicare Other | Admitting: Surgery

## 2013-03-05 ENCOUNTER — Telehealth (INDEPENDENT_AMBULATORY_CARE_PROVIDER_SITE_OTHER): Payer: Self-pay | Admitting: *Deleted

## 2013-03-05 ENCOUNTER — Encounter (INDEPENDENT_AMBULATORY_CARE_PROVIDER_SITE_OTHER): Payer: Self-pay | Admitting: Surgery

## 2013-03-05 VITALS — BP 138/92 | HR 60 | Temp 98.9°F | Resp 14 | Ht 60.0 in | Wt 196.8 lb

## 2013-03-05 DIAGNOSIS — E669 Obesity, unspecified: Secondary | ICD-10-CM

## 2013-03-05 DIAGNOSIS — K432 Incisional hernia without obstruction or gangrene: Secondary | ICD-10-CM

## 2013-03-05 NOTE — Patient Instructions (Signed)
See the Handout(s) we gave you.  Consider surgery To fix the hernia recurrent near her bellybutton.  Also do diagnostic laparoscopy to rule out a new hernia in your left upper side near your old subcostal incision .  Please call our office at [phone] if you wish to schedule surgery or if you have further questions / concerns.   Hernia A hernia occurs when an internal organ pushes out through a weak spot in the abdominal wall. Hernias most commonly occur in the groin and around the navel. Hernias often can be pushed back into place (reduced). Most hernias tend to get worse over time. Some abdominal hernias can get stuck in the opening (irreducible or incarcerated hernia) and cannot be reduced. An irreducible abdominal hernia which is tightly squeezed into the opening is at risk for impaired blood supply (strangulated hernia). A strangulated hernia is a medical emergency. Because of the risk for an irreducible or strangulated hernia, surgery may be recommended to repair a hernia. CAUSES   Heavy lifting.  Prolonged coughing.  Straining to have a bowel movement.  A cut (incision) made during an abdominal surgery. HOME CARE INSTRUCTIONS   Bed rest is not required. You may continue your normal activities.  Avoid lifting more than 10 pounds (4.5 kg) or straining.  Cough gently. If you are a smoker it is best to stop. Even the best hernia repair can break down with the continual strain of coughing. Even if you do not have your hernia repaired, a cough will continue to aggravate the problem.  Do not wear anything tight over your hernia. Do not try to keep it in with an outside bandage or truss. These can damage abdominal contents if they are trapped within the hernia sac.  Eat a normal diet.  Avoid constipation. Straining over long periods of time will increase hernia size and encourage breakdown of repairs. If you cannot do this with diet alone, stool softeners may be used. SEEK IMMEDIATE  MEDICAL CARE IF:   You have a fever.  You develop increasing abdominal pain.  You feel nauseous or vomit.  Your hernia is stuck outside the abdomen, looks discolored, feels hard, or is tender.  You have any changes in your bowel habits or in the hernia that are unusual for you.  You have increased pain or swelling around the hernia.  You cannot push the hernia back in place by applying gentle pressure while lying down. MAKE SURE YOU:   Understand these instructions.  Will watch your condition.  Will get help right away if you are not doing well or get worse. Document Released: 04/25/2005 Document Revised: 07/18/2011 Document Reviewed: 12/13/2007 [facility] Patient Information 2014 [place], [place].  HERNIA REPAIR: POST OP INSTRUCTIONS  1. DIET: Follow a light bland diet the first 24 hours after arrival home, such as soup, liquids, crackers, etc.  Be sure to include lots of fluids daily.  Avoid fast food or heavy meals as your are more likely to get nauseated.  Eat a low fat the next few days after surgery. 2. Take your usually prescribed home medications unless otherwise directed. 3. PAIN CONTROL: a. Pain is best controlled by a usual combination of three different methods TOGETHER: i. Ice/Heat ii. Over the counter pain medication iii. Prescription pain medication b. Most patients will experience some swelling and bruising around the hernia(s) such as the bellybutton, groins, or old incisions.  Ice packs or heating pads (30-60 minutes up to 6 times a day) will help.  Use ice for the first few days to help decrease swelling and bruising, then switch to heat to help relax tight/sore spots and speed recovery.  Some people prefer to use ice alone, heat alone, alternating between ice & heat.  Experiment to what works for you.  Swelling and bruising can take several weeks to resolve.   c. It is helpful to take an over-the-counter pain medication regularly for the first few weeks.  Choose one  of the following that works best for you: i. Naproxen (Aleve, etc)  Two 220mg  tabs twice a day ii. Ibuprofen (Advil, etc) Three 200mg  tabs four times a day (every meal & bedtime) iii. Acetaminophen (Tylenol, etc) 325-650mg  four times a day (every meal & bedtime) d. A  prescription for pain medication should be given to you upon discharge.  Take your pain medication as prescribed.  i. If you are having problems/concerns with the prescription medicine (does not control pain, nausea, vomiting, rash, itching, etc), please call us [phone] to see if we need to switch you to a different pain medicine that will work better for you and/or control your side effect better. ii. If you need a refill on your pain medication, please contact your pharmacy.  They will contact our office to request authorization. Prescriptions will not be filled after 5 pm or on week-ends. 4. Avoid getting constipated.  Between the surgery and the pain medications, it is common to experience some constipation.  Increasing fluid intake and taking a fiber supplement (such as Metamucil, Citrucel, FiberCon, MiraLax, etc) 1-2 times a day regularly will usually help prevent this problem from occurring.  A mild laxative (prune juice, Milk of Magnesia, MiraLax, etc) should be taken according to package directions if there are no bowel movements after 48 hours.   5. Wash / shower every day.  You may shower over the dressings as they are waterproof.   6. Remove your waterproof bandages 5 days after surgery.  You may leave the incision open to air.  You may replace a dressing/Band-Aid to cover the incision for comfort if you wish.  Continue to shower over incision(s) after the dressing is off.    7. ACTIVITIES as tolerated:   a. You may resume regular (light) daily activities beginning the next day-such as daily self-care, walking, climbing stairs-gradually increasing activities as tolerated.  If you can walk 30 minutes without difficulty,  it is safe to try more intense activity such as jogging, treadmill, bicycling, low-impact aerobics, swimming, etc. b. Save the most intensive and strenuous activity for last such as sit-ups, heavy lifting, contact sports, etc  Refrain from any heavy lifting or straining until you are off narcotics for pain control.   c. DO NOT PUSH THROUGH PAIN.  Let pain be your guide: If it hurts to do something, don't do it.  Pain is your body warning you to avoid that activity for another week until the pain goes down. d. You may drive when you are no longer taking prescription pain medication, you can comfortably wear a seatbelt, and you can safely maneuver your car and apply brakes. e. Bonita Quin may have sexual intercourse when it is comfortable.  8. FOLLOW UP in our office a. Please call [facility] at [phone] to set up an appointment to see your surgeon in the office for a follow-up appointment approximately 2-3 weeks after your surgery. b. Make sure that you call for this appointment the day you arrive home to insure a convenient appointment time.  9.  IF YOU HAVE DISABILITY OR FAMILY LEAVE FORMS, BRING THEM TO THE OFFICE FOR PROCESSING.  DO NOT GIVE THEM TO YOUR DOCTOR.  WHEN TO CALL us [phone]: 1. Poor pain control 2. Reactions / problems with new medications (rash/itching, nausea, etc)  3. Fever over 101.5 F (38.5 C) 4. Inability to urinate 5. Nausea and/or vomiting 6. Worsening swelling or bruising 7. Continued bleeding from incision. 8. Increased pain, redness, or drainage from the incision   The clinic staff is available to answer your questions during regular business hours (8:30am-5pm).  Please don't hesitate to call and ask to speak to one of our nurses for clinical concerns.   If you have a medical emergency, go to the nearest emergency room or call 911.  A surgeon from [facility] is always on call at the hospitals in [facility], [place] [address], [address], [place], [place]  [address] ?  [address] [address], [place], [place]   [address] MAIN: [phone] ? TOLL FREE: [phone] ? FAX: [phone] [email or web address]  Exercise to Stay Healthy Exercise helps you become and stay healthy. EXERCISE IDEAS AND TIPS Choose exercises that:  You enjoy.  Fit into your day. You do not need to exercise really hard to be healthy. You can do exercises at a slow or medium level and stay healthy. You can:  Stretch before and after working out.  Try yoga, Pilates, or tai chi.  Lift weights.  Walk fast, swim, jog, run, climb stairs, bicycle, dance, or rollerskate.  Take aerobic classes. Exercises that burn about 150 calories:  Running 1  miles in 15 minutes.  Playing volleyball for 45 to 60 minutes.  Washing and waxing a car for 45 to 60 minutes.  Playing touch football for 45 minutes.  Walking 1  miles in 35 minutes.  Pushing a stroller 1  miles in 30 minutes.  Playing basketball for 30 minutes.  Raking leaves for 30 minutes.  Bicycling 5 miles in 30 minutes.  Walking 2 miles in 30 minutes.  Dancing for 30 minutes.  Shoveling snow for 15 minutes.  Swimming laps for 20 minutes.  Walking up stairs for 15 minutes.  Bicycling 4 miles in 15 minutes.  Gardening for 30 to 45 minutes.  Jumping rope for 15 minutes.  Washing windows or floors for 45 to 60 minutes. Document Released: 05/28/2010 Document Revised: 07/18/2011 Document Reviewed: 05/28/2010 [facility] Patient Information 2014 [place], [place].

## 2013-03-05 NOTE — Progress Notes (Signed)
Subjective:     Patient ID: [name], female   DOB: 1946/02/03, 67 y.o.   MRN: [id number]  HPI  [facility]  10/19/1945 [id number]  Patient Care Team: Barnett Abu, MD as PCP - General (Neurosurgery) Bernita Buffy. Duard Brady, MD as Consulting Physician (Gynecologic Oncology) Mardella Layman, MD as Consulting Physician (Gastroenterology) Crecencio Mc, MD as Consulting Physician (Urology)  This patient is a 67 y.o.female who presents today for surgical evaluation at the request of Dr. Duard Brady.   Reason for visit: Possible recurrent incisional hernia  Pleasant woman.  Survivor of endometrial cancer.  Underwent laparoscopic lysis lesions and small bowel resection for chronic jejunal diverticulum/stricture.  No longer has any nausea vomiting or bowel obstruction-like symptoms.  She has been disease-free on her endometrial cancer surgery.  However, bulging was noted in the left abdomen.  Concern for recurrent hernia.  Surgical consultation requested.  She comes today with her daughter.  Eating well.  Moving her bowels well.  In good spirits.  Denies really any abdominal pain.  No fevers chills or sweats.  Having daily bowel movements.  No history of infections.  Walking with a cane.  Patient Active Problem List   Diagnosis Date Noted  . Need for prophylactic vaccination and inoculation against influenza 02/21/2013  . Colon polyp, hyperplastic 02/21/2013  . 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 Contusion shoulder/arm 02/21/2013  . Pseudoarthrosis L3- L5 12/17/2012  . Nocturnal leg cramps 11/13/2012  . HLD (hyperlipidemia) 08/10/2012  . Recurrent ventral incisional hernia s/p primary repair 07/19/2012 04/27/2012  . SBO (small bowel obstruction) - recurrent 04/27/2012  . Obesity (BMI 30-39.9) 04/27/2012  . Chronic pain 04/27/2012  . Hypokalemia 04/27/2012  . Hypertension   . History of endometrial cancer s/p TAH BSO June2013 06/15/2011     Past Medical History  Diagnosis Date  . Endometrioid adenocarcinoma   . Hypertension   . Obesity (BMI 30-39.9) 04/27/2012  . Arthritis   . Hyperlipidemia   . Ureteral adhesion, left 2010    s/p ureterolysis & stenting Dr. Dolores Lory now  . 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 Colon polyp, hyperplastic 04/27/2012    Colonoscopy 2010.  No adenomatous polyps   . History of endometrial cancer s/p TAH BSO June2013 06/15/2011    Grade 1 endometrioid adenocarcinoma with focal superficial myometrial invasion of 0.1 cm with a myometrium of 1.8 cm or approximately 7%. She had a 5-cm tumor. No lymphovascular space Involvement.    . Mass of in fold of jejunum s/p SB resection 07/19/2012 07/20/2012  . UTI (urinary tract infection)     Past Surgical History  Procedure Laterality Date  . Abdominal hysterectomy  11/02/10    TAH, BSO, lysis of adhesions for endometrial cancer  . Incisional hernia repair  11/02/10  . Back surgery  1989, 1999, 2009    anterior L2-3, L3-4 arthrodesis  . Kidney surgery  2010    Left kidney  . Bladder suspension  2003  . Foot surgery  06/2010    left foot surgery  . Joint replacement  09/2009    Right total knee  . Total hip arthroplasty  01/2010    Right total hip  . Cataract extraction, bilateral  2003  . Total knee arthroplasty      right  . Ureterolysis  2010    lap/open left ureterolysis & omental flap, ureter stent  . Ventral hernia repair N/A  07/19/2012    Procedure: LAPAROSCOPIC LYSIS OF ADHESIONS, SMALL BOWEL RESECTION, SEROSAL REPAIR, PRIMARY VENTRAL HERNIA REPAIR;  Surgeon: Ardeth Sportsman, MD;  Location: [facility];  Service: General;  Laterality: N/A;  . Hernia repair  07/19/12    lap VWH repair  . Eye surgery    . Back surgery  12/17/2012    Spinal fusion and repair     History   Social History  . Marital Status: Widowed    Spouse Name: N/A    Number of Children: N/A  . Years of  Education: N/A   Occupational History  . Not on file.   Social History Main Topics  . Smoking status: Never Smoker   . Smokeless tobacco: Not on file  . Alcohol Use: No  . Drug Use: No  . Sexual Activity: No   Other Topics Concern  . Not on file   Social History Narrative  . No narrative on file    Family History  Problem Relation Age of Onset  . Diabetes Mother   . Cancer Mother     lung  . Diabetes Father   . Cancer Father     liver  . Colon cancer Sister   . Diabetes Sister   . Cancer Sister     colon    Current Outpatient Prescriptions  Medication Sig Dispense Refill  . amitriptyline (ELAVIL) 50 MG tablet Take 1 tablet (50 mg total) by mouth at bedtime.  90 tablet  3  . docusate sodium (COLACE) 100 MG capsule Take 100 mg by mouth at bedtime.      . fish oil-omega-3 fatty acids 1000 MG capsule Take 1 g by mouth daily.      Marland Kitchen gabapentin (NEURONTIN) 300 MG capsule Take 1 capsule (300 mg total) by mouth at bedtime.  90 capsule  3  . HYDROcodone-acetaminophen (NORCO/VICODIN) 5-325 MG per tablet Take 1 tablet by mouth every 6 (six) hours as needed for pain. For recent back surgery (12/17/2012)      . lisinopril-hydrochlorothiazide (PRINZIDE,ZESTORETIC) 20-12.5 MG per tablet Take 1 tablet by mouth every morning.  90 tablet  3  . meloxicam (MOBIC) 15 MG tablet Take 1 tablet (15 mg total) by mouth every morning.  90 tablet  3  . simvastatin (ZOCOR) 20 MG tablet Take 1 tablet (20 mg total) by mouth at bedtime.  90 tablet  3   No current facility-administered medications for this visit.     Allergies  Allergen Reactions  . Cashew Nut Oil Hives  . Sulfamethoxazole     Mouth sores  . Zofran Hives and Nausea Only  . Zofran [Ondansetron Hcl]     BP 138/92  Pulse 60  Temp(Src) 98.9 F (37.2 C) (Temporal)  Resp 14  Ht 5' (1.524 m)  Wt 196 lb 12.8 oz (89.268 kg)  BMI 38.43 kg/m2  Dg Shoulder Left  02/21/2013   CLINICAL DATA:  Status post fall. Left shoulder pain.   EXAM: LEFT SHOULDER - 2+ VIEW  COMPARISON:  None.  FINDINGS: No acute bony or joint abnormality is identified. A small calcification is seen in the rotator cuff consistent with calcific tendinopathy. Moderate acromioclavicular and glenohumeral degenerative change is seen. Imaged left lung and ribs are clear.  IMPRESSION: No acute finding.  Calcific rotator cuff tendinopathy.  Acromioclavicular and glenohumeral degenerative disease.   Electronically Signed   By: Drusilla Kanner M.D.   On: 02/21/2013 09:29     Review of Systems  Constitutional: Negative for  fever, chills, diaphoresis, appetite change and fatigue.  HENT: Negative for ear discharge, ear pain, sore throat and trouble swallowing.   Eyes: Negative for photophobia, discharge and visual disturbance.  Respiratory: Negative for cough, choking, chest tightness and shortness of breath.   Cardiovascular: Negative for chest pain and palpitations.  Gastrointestinal: Negative for nausea, vomiting, abdominal pain, diarrhea, constipation, blood in stool, abdominal distention, anal bleeding and rectal pain.  Endocrine: Negative for cold intolerance and heat intolerance.  Genitourinary: Negative for dysuria, frequency and difficulty urinating.  Musculoskeletal: Negative for gait problem, myalgias and neck pain.  Skin: Negative for color change, pallor and rash.  Allergic/Immunologic: Negative for environmental allergies, food allergies and immunocompromised state.  Neurological: Negative for dizziness, speech difficulty, weakness and numbness.  Hematological: Negative for adenopathy.  Psychiatric/Behavioral: Negative for confusion and agitation. The patient is not nervous/anxious.        Objective:   Physical Exam  Constitutional: She is oriented to person, place, and time. She appears well-developed and well-nourished. No distress.  HENT:  Head: Normocephalic.  Mouth/Throat: Oropharynx is clear and moist. No oropharyngeal exudate.  Eyes:  Conjunctivae and EOM are normal. Pupils are equal, round, and reactive to light. No scleral icterus.  Neck: Normal range of motion. Neck supple. No tracheal deviation present.  Cardiovascular: Normal rate, regular rhythm and intact distal pulses.   Pulmonary/Chest: Effort normal and breath sounds normal. No stridor. No respiratory distress. She exhibits no tenderness.  Abdominal: Soft. She exhibits no distension and no mass. There is no tenderness. There is no rigidity, no rebound, no guarding, no CVA tenderness and negative Murphy's sign. A hernia is present. Hernia confirmed positive in the ventral area. Hernia confirmed negative in the right inguinal area and confirmed negative in the left inguinal area.    Genitourinary: No vaginal discharge found.  Musculoskeletal: Normal range of motion. She exhibits no tenderness.       Right elbow: She exhibits normal range of motion.       Left elbow: She exhibits normal range of motion.       Right wrist: She exhibits normal range of motion.       Left wrist: She exhibits normal range of motion.       Right hand: Normal strength noted.       Left hand: Normal strength noted.  Lymphadenopathy:       Head (right side): No posterior auricular adenopathy present.       Head (left side): No posterior auricular adenopathy present.    She has no cervical adenopathy.    She has no axillary adenopathy.       Right: No inguinal adenopathy present.       Left: No inguinal adenopathy present.  Neurological: She is alert and oriented to person, place, and time. No cranial nerve deficit. She exhibits normal muscle tone. Coordination normal.  Skin: Skin is warm and dry. No rash noted. She is not diaphoretic. No erythema.  Psychiatric: She has a normal mood and affect. Her behavior is normal. Judgment and thought content normal.       Assessment:     Recurrent periumbilical ventral hernia.  Second recurrence.  Laxity of left abdominal wall more likely to  chronic nerve damage from prior left subcostal incision.     Plan:     Standard of care would to be providing a repair of that hernia to avoid recurrence.  She is concerned about possibility of incarceration/strangulation and wishes to have repair.  This would allow me to do diagnostic laparoscopy in the left abdomen as well.  I do not see any evidence of hernia on that side was able to see her incision while over there, so I am skeptical if there is recurrence there.  I did caution her she has a risk of bowel injury pain and recurrence.  However, she tolerated the lap LOA & SB resection last time is feeling better.  Hopefully her adhesions will be less this time around.  She failed primary repair x2 periumbilically, so I do not see a way to avoid mesh.  Since no need for bowel resection and nonemergency, good likelihood that can use mesh to help decrease the chance of recurrence.  The anatomy & physiology of the abdominal wall was discussed.  The pathophysiology of hernias was discussed.  Natural history risks without surgery including progeressive enlargement, pain, incarceration & strangulation was discussed.   Contributors to complications such as smoking, obesity, diabetes, prior surgery, etc were discussed.   I feel the risks of no intervention will lead to serious problems that outweigh the operative risks; therefore, I recommended surgery to reduce and repair the hernia.  I explained laparoscopic techniques with possible need for an open approach.  I noted the probable use of mesh to patch and/or buttress the hernia repair  Risks such as bleeding, infection, abscess, need for further treatment, heart attack, death, and other risks were discussed.  I noted a good likelihood this will help address the problem.   Goals of post-operative recovery were discussed as well.  Possibility that this will not correct all symptoms was explained.  I stressed the importance of low-impact activity, aggressive pain  control, avoiding constipation, & not pushing through pain to minimize risk of post-operative chronic pain or injury. Possibility of reherniation especially with smoking, obesity, diabetes, immunosuppression, and other health conditions was discussed.  We will work to minimize complications.     An educational handout further explaining the pathology & treatment options was given as well.  Questions were answered.  The patient expresses understanding & wishes to proceed with surgery.

## 2013-03-08 ENCOUNTER — Ambulatory Visit (INDEPENDENT_AMBULATORY_CARE_PROVIDER_SITE_OTHER): Payer: Medicare Other | Admitting: *Deleted

## 2013-03-08 DIAGNOSIS — R7309 Other abnormal glucose: Secondary | ICD-10-CM

## 2013-03-08 DIAGNOSIS — M25512 Pain in left shoulder: Secondary | ICD-10-CM

## 2013-03-08 LAB — POCT GLYCOSYLATED HEMOGLOBIN (HGB A1C): Hemoglobin A1C: 5.5

## 2013-03-08 MED ORDER — ACETAMINOPHEN-CODEINE #2 300-15 MG PO TABS: 1.0000 | ORAL_TABLET | ORAL | Status: DC | PRN

## 2013-03-08 NOTE — Progress Notes (Signed)
Patient ID: [name], female   DOB: [birth year], 67 y.o.   MRN: [id number] Pt having persistent L shoulder pain Has been exercising at home and taking Mobic and using Bio freeze with no relief Refuses PT referral Per [facility] pt can try Tylenol #3 x 2 weeks  If no better will do Ortho referral

## 2013-03-08 NOTE — Progress Notes (Signed)
Quick Note:  Call patient. Labs normal. No change in plan. ______ 

## 2013-03-08 NOTE — Addendum Note (Signed)
Addended by: Orma Render F on: 03/08/2013 12:05 PM   Modules accepted: Orders

## 2013-03-25 ENCOUNTER — Other Ambulatory Visit: Payer: Self-pay | Admitting: Family Medicine

## 2013-03-25 DIAGNOSIS — N63 Unspecified lump in unspecified breast: Secondary | ICD-10-CM

## 2013-03-25 NOTE — Progress Notes (Signed)
Dr. Michaell Cowing - Please enter preop orders in Epic  - Thanks.

## 2013-03-26 ENCOUNTER — Other Ambulatory Visit (INDEPENDENT_AMBULATORY_CARE_PROVIDER_SITE_OTHER): Payer: Self-pay | Admitting: Surgery

## 2013-04-02 ENCOUNTER — Encounter (HOSPITAL_COMMUNITY): Payer: Self-pay | Admitting: Pharmacy Technician

## 2013-04-08 ENCOUNTER — Encounter (HOSPITAL_COMMUNITY): Payer: Self-pay

## 2013-04-08 ENCOUNTER — Encounter (INDEPENDENT_AMBULATORY_CARE_PROVIDER_SITE_OTHER): Payer: Self-pay

## 2013-04-08 ENCOUNTER — Encounter (HOSPITAL_COMMUNITY)
Admission: RE | Admit: 2013-04-08 | Discharge: 2013-04-08 | Disposition: A | Discharge status: Home / Self Care | Payer: Medicare Other | Source: Ambulatory Visit | Attending: Surgery | Admitting: Surgery

## 2013-04-08 DIAGNOSIS — Z01818 Encounter for other preprocedural examination: Secondary | ICD-10-CM | POA: Insufficient documentation

## 2013-04-08 DIAGNOSIS — Z01812 Encounter for preprocedural laboratory examination: Secondary | ICD-10-CM | POA: Insufficient documentation

## 2013-04-08 LAB — CBC
HCT: 38 % (ref 36.0–46.0)
Hemoglobin: 12.9 g/dL (ref 12.0–15.0)
MCH: 30.5 pg (ref 26.0–34.0)
MCHC: 33.9 g/dL (ref 30.0–36.0)
MCV: 89.8 fL (ref 78.0–100.0)
RBC: 4.23 MIL/uL (ref 3.87–5.11)
WBC: 7.6 10*3/uL (ref 4.0–10.5)

## 2013-04-08 LAB — BASIC METABOLIC PANEL
BUN: 16 mg/dL (ref 6–23)
CO2: 26 mEq/L (ref 19–32)
Calcium: 9.1 mg/dL (ref 8.4–10.5)
Creatinine, Ser: 0.67 mg/dL (ref 0.50–1.10)
GFR calc non Af Amer: 88 mL/min — ABNORMAL LOW (ref >90)
Glucose, Bld: 162 mg/dL — ABNORMAL HIGH (ref 70–99)
Sodium: 138 mEq/L (ref 135–145)

## 2013-04-08 MED ORDER — CEFAZOLIN SODIUM-DEXTROSE 2-3 GM-% IV SOLR: 2.0000 g | INTRAVENOUS | Status: DC

## 2013-04-08 NOTE — Pre-Procedure Instructions (Signed)
04-08-13 EKG 3'14/ CXR 6'14 -Epic.

## 2013-04-08 NOTE — Patient Instructions (Signed)
20 [name]  04/08/2013   Your procedure is scheduled on:   12-16--2014  Report to [facility] at  0930     AM.  Call this number if you have problems the morning of surgery: [phone]  Or Presurgical Testing [phone](Caliana Spires)   . Do not eat food:After Midnight.  .  Take these medicines the morning of surgery with A SIP OF WATER: Hydrocodone   Do not wear jewelry, make-up or nail polish.  Do not wear lotions, powders, or perfumes. You may wear deodorant.  Do not shave 12 hours prior to first CHG shower(legs and under arms).(face and neck okay.)  Do not bring valuables to the hospital.  Contacts, dentures or removable bridgework, body piercing, hair pins may not be worn into surgery.  Leave suitcase in the car. After surgery it may be brought to your room.  For patients admitted to the hospital, checkout time is 11:00 AM the day of discharge.   Patients discharged the day of surgery will not be allowed to drive home. Must have responsible person with you x 24 hours once discharged.  Name and phone number of your driver: [phone] cell [name]  Special Instructions: CHG(Chlorhedine 4%-"Hibiclens","Betasept","Aplicare") Shower Use Special Wash: see special instructions.(avoid face and genitals)   Failure to follow these instructions may result in Cancellation of your surgery.   Patient signature_______________________________________________________

## 2013-04-17 ENCOUNTER — Other Ambulatory Visit (HOSPITAL_COMMUNITY): Payer: Medicare Other

## 2013-04-22 NOTE — Progress Notes (Signed)
Message left on patient's phone to report to short stay at 0845 04/23/13 due to time change

## 2013-04-23 ENCOUNTER — Observation Stay (HOSPITAL_COMMUNITY)
Admission: RE | Admit: 2013-04-23 | Discharge: 2013-04-24 | Disposition: A | Discharge status: Home / Self Care | Payer: Medicare Other | Source: Ambulatory Visit | Attending: Surgery | Admitting: Surgery

## 2013-04-23 ENCOUNTER — Encounter (HOSPITAL_COMMUNITY): Payer: Self-pay | Admitting: *Deleted

## 2013-04-23 ENCOUNTER — Encounter (HOSPITAL_COMMUNITY): Payer: Medicare Other | Admitting: Anesthesiology

## 2013-04-23 ENCOUNTER — Ambulatory Visit (HOSPITAL_COMMUNITY): Payer: Medicare Other | Admitting: Anesthesiology

## 2013-04-23 ENCOUNTER — Encounter (HOSPITAL_COMMUNITY)
Admission: RE | Disposition: A | Discharge status: Home / Self Care | Payer: Self-pay | Source: Ambulatory Visit | Attending: Surgery

## 2013-04-23 DIAGNOSIS — K432 Incisional hernia without obstruction or gangrene: Secondary | ICD-10-CM

## 2013-04-23 DIAGNOSIS — Z981 Arthrodesis status: Secondary | ICD-10-CM | POA: Insufficient documentation

## 2013-04-23 DIAGNOSIS — S32009K Unspecified fracture of unspecified lumbar vertebra, subsequent encounter for fracture with nonunion: Secondary | ICD-10-CM

## 2013-04-23 DIAGNOSIS — Z96659 Presence of unspecified artificial knee joint: Secondary | ICD-10-CM | POA: Insufficient documentation

## 2013-04-23 DIAGNOSIS — Z90710 Acquired absence of both cervix and uterus: Secondary | ICD-10-CM | POA: Insufficient documentation

## 2013-04-23 DIAGNOSIS — K660 Peritoneal adhesions (postprocedural) (postinfection): Secondary | ICD-10-CM

## 2013-04-23 DIAGNOSIS — E669 Obesity, unspecified: Secondary | ICD-10-CM | POA: Diagnosis present

## 2013-04-23 DIAGNOSIS — G8929 Other chronic pain: Secondary | ICD-10-CM | POA: Diagnosis present

## 2013-04-23 DIAGNOSIS — Z859 Personal history of malignant neoplasm, unspecified: Secondary | ICD-10-CM | POA: Insufficient documentation

## 2013-04-23 DIAGNOSIS — G4762 Sleep related leg cramps: Secondary | ICD-10-CM | POA: Diagnosis present

## 2013-04-23 DIAGNOSIS — Z6839 Body mass index (BMI) 39.0-39.9, adult: Secondary | ICD-10-CM | POA: Insufficient documentation

## 2013-04-23 DIAGNOSIS — E785 Hyperlipidemia, unspecified: Secondary | ICD-10-CM | POA: Insufficient documentation

## 2013-04-23 DIAGNOSIS — Z96649 Presence of unspecified artificial hip joint: Secondary | ICD-10-CM | POA: Insufficient documentation

## 2013-04-23 DIAGNOSIS — R252 Cramp and spasm: Secondary | ICD-10-CM | POA: Insufficient documentation

## 2013-04-23 DIAGNOSIS — Z8542 Personal history of malignant neoplasm of other parts of uterus: Secondary | ICD-10-CM

## 2013-04-23 DIAGNOSIS — I10 Essential (primary) hypertension: Secondary | ICD-10-CM | POA: Diagnosis present

## 2013-04-23 HISTORY — PX: VENTRAL HERNIA REPAIR: SHX424

## 2013-04-23 HISTORY — PX: LAPAROSCOPIC LYSIS OF ADHESIONS: SHX5905

## 2013-04-23 HISTORY — PX: INSERTION OF MESH: SHX5868

## 2013-04-23 SURGERY — REPAIR, HERNIA, VENTRAL, LAPAROSCOPIC
Anesthesia: General | Site: Abdomen

## 2013-04-23 MED ORDER — LACTATED RINGERS IV SOLN
INTRAVENOUS | Status: DC | PRN
  Administered 2013-04-23: 10:00:00 via INTRAVENOUS

## 2013-04-23 MED ORDER — HYDROMORPHONE HCL PF 1 MG/ML IJ SOLN
INTRAMUSCULAR | Status: AC
  Filled 2013-04-23: qty 1

## 2013-04-23 MED ORDER — BUPIVACAINE 0.25 % ON-Q PUMP DUAL CATH 300 ML
300.0000 mL | INJECTION | Status: DC
  Administered 2013-04-23: 300 mL
  Filled 2013-04-23: qty 300

## 2013-04-23 MED ORDER — NEOSTIGMINE METHYLSULFATE 1 MG/ML IJ SOLN
INTRAMUSCULAR | Status: DC | PRN
  Administered 2013-04-23: 4 mg via INTRAVENOUS

## 2013-04-23 MED ORDER — ZOLPIDEM TARTRATE 5 MG PO TABS
5.0000 mg | ORAL_TABLET | Freq: Every evening | ORAL | Status: DC | PRN
  Administered 2013-04-23: 5 mg via ORAL
  Filled 2013-04-23: qty 1

## 2013-04-23 MED ORDER — GLYCOPYRROLATE 0.2 MG/ML IJ SOLN
INTRAMUSCULAR | Status: AC
  Filled 2013-04-23: qty 1

## 2013-04-23 MED ORDER — 0.9 % SODIUM CHLORIDE (POUR BTL) OPTIME
TOPICAL | Status: DC | PRN
  Administered 2013-04-23: 1000 mL

## 2013-04-23 MED ORDER — LIP MEDEX EX OINT
1.0000 "application " | TOPICAL_OINTMENT | Freq: Two times a day (BID) | CUTANEOUS | Status: DC
  Administered 2013-04-24: 1 via TOPICAL
  Filled 2013-04-23: qty 7

## 2013-04-23 MED ORDER — GLYCOPYRROLATE 0.2 MG/ML IJ SOLN
INTRAMUSCULAR | Status: DC | PRN
  Administered 2013-04-23: 0.6 mg via INTRAVENOUS

## 2013-04-23 MED ORDER — CEFAZOLIN SODIUM-DEXTROSE 2-3 GM-% IV SOLR
2.0000 g | Freq: Four times a day (QID) | INTRAVENOUS | Status: AC
  Administered 2013-04-23 – 2013-04-24 (×3): 2 g via INTRAVENOUS
  Filled 2013-04-23 (×3): qty 50

## 2013-04-23 MED ORDER — PROPOFOL 10 MG/ML IV BOLUS
INTRAVENOUS | Status: AC
  Filled 2013-04-23: qty 20

## 2013-04-23 MED ORDER — ROCURONIUM BROMIDE 100 MG/10ML IV SOLN
INTRAVENOUS | Status: DC | PRN
  Administered 2013-04-23 (×2): 20 mg via INTRAVENOUS
  Administered 2013-04-23 (×2): 10 mg via INTRAVENOUS
  Administered 2013-04-23: 50 mg via INTRAVENOUS

## 2013-04-23 MED ORDER — HYDROCODONE-ACETAMINOPHEN 10-325 MG PO TABS: 1.0000 | ORAL_TABLET | Freq: Four times a day (QID) | ORAL | Status: DC | PRN

## 2013-04-23 MED ORDER — FENTANYL CITRATE 0.05 MG/ML IJ SOLN
INTRAMUSCULAR | Status: AC
  Filled 2013-04-23: qty 2

## 2013-04-23 MED ORDER — METRONIDAZOLE IN NACL 5-0.79 MG/ML-% IV SOLN
INTRAVENOUS | Status: AC
  Filled 2013-04-23: qty 100

## 2013-04-23 MED ORDER — CHLORHEXIDINE GLUCONATE 4 % EX LIQD: 1.0000 "application " | Freq: Once | CUTANEOUS | Status: DC

## 2013-04-23 MED ORDER — FENTANYL CITRATE 0.05 MG/ML IJ SOLN
INTRAMUSCULAR | Status: AC
  Filled 2013-04-23: qty 5

## 2013-04-23 MED ORDER — STERILE WATER FOR IRRIGATION IR SOLN
Status: DC | PRN
  Administered 2013-04-23: 1500 mL

## 2013-04-23 MED ORDER — HYDROMORPHONE HCL PF 1 MG/ML IJ SOLN: 0.5000 mg | INTRAMUSCULAR | Status: DC | PRN

## 2013-04-23 MED ORDER — PROMETHAZINE HCL 25 MG/ML IJ SOLN: 6.2500 mg | INTRAMUSCULAR | Status: DC | PRN

## 2013-04-23 MED ORDER — GABAPENTIN 300 MG PO CAPS
300.0000 mg | ORAL_CAPSULE | Freq: Two times a day (BID) | ORAL | Status: DC
  Administered 2013-04-23 – 2013-04-24 (×2): 300 mg via ORAL
  Filled 2013-04-23 (×3): qty 1

## 2013-04-23 MED ORDER — GLYCOPYRROLATE 0.2 MG/ML IJ SOLN
INTRAMUSCULAR | Status: AC
  Filled 2013-04-23: qty 2

## 2013-04-23 MED ORDER — DOCUSATE SODIUM 100 MG PO CAPS
100.0000 mg | ORAL_CAPSULE | Freq: Every day | ORAL | Status: DC
  Administered 2013-04-23: 100 mg via ORAL
  Filled 2013-04-23 (×2): qty 1

## 2013-04-23 MED ORDER — MAGIC MOUTHWASH
15.0000 mL | Freq: Four times a day (QID) | ORAL | Status: DC | PRN
  Filled 2013-04-23: qty 15

## 2013-04-23 MED ORDER — FENTANYL CITRATE 0.05 MG/ML IJ SOLN: 25.0000 ug | INTRAMUSCULAR | Status: DC | PRN

## 2013-04-23 MED ORDER — MELOXICAM 15 MG PO TABS
15.0000 mg | ORAL_TABLET | Freq: Every morning | ORAL | Status: DC
  Administered 2013-04-24: 15 mg via ORAL
  Filled 2013-04-23: qty 1

## 2013-04-23 MED ORDER — BUPIVACAINE 0.25 % ON-Q PUMP DUAL CATH 300 ML
300.0000 mL | INJECTION | Status: DC
  Filled 2013-04-23: qty 300

## 2013-04-23 MED ORDER — ALUM & MAG HYDROXIDE-SIMETH 200-200-20 MG/5ML PO SUSP: 30.0000 mL | Freq: Four times a day (QID) | ORAL | Status: DC | PRN

## 2013-04-23 MED ORDER — DEXAMETHASONE SODIUM PHOSPHATE 10 MG/ML IJ SOLN
INTRAMUSCULAR | Status: DC | PRN
  Administered 2013-04-23: 5 mg via INTRAVENOUS

## 2013-04-23 MED ORDER — PROPOFOL 10 MG/ML IV BOLUS
INTRAVENOUS | Status: DC | PRN
  Administered 2013-04-23: 150 mg via INTRAVENOUS

## 2013-04-23 MED ORDER — BUPIVACAINE-EPINEPHRINE PF 0.25-1:200000 % IJ SOLN
INTRAMUSCULAR | Status: AC
  Filled 2013-04-23: qty 90

## 2013-04-23 MED ORDER — CEFAZOLIN SODIUM-DEXTROSE 2-3 GM-% IV SOLR
INTRAVENOUS | Status: AC
  Filled 2013-04-23: qty 50

## 2013-04-23 MED ORDER — CEFAZOLIN SODIUM-DEXTROSE 2-3 GM-% IV SOLR
2.0000 g | INTRAVENOUS | Status: AC
  Administered 2013-04-23: 2 g via INTRAVENOUS
  Filled 2013-04-23: qty 50

## 2013-04-23 MED ORDER — KETOROLAC TROMETHAMINE 30 MG/ML IJ SOLN
INTRAMUSCULAR | Status: AC
  Filled 2013-04-23: qty 1

## 2013-04-23 MED ORDER — DEXAMETHASONE SODIUM PHOSPHATE 10 MG/ML IJ SOLN
INTRAMUSCULAR | Status: AC
  Filled 2013-04-23: qty 1

## 2013-04-23 MED ORDER — PHENYLEPHRINE HCL 10 MG/ML IJ SOLN
INTRAMUSCULAR | Status: DC | PRN
  Administered 2013-04-23: 80 ug via INTRAVENOUS

## 2013-04-23 MED ORDER — PROMETHAZINE HCL 25 MG/ML IJ SOLN: 12.5000 mg | Freq: Four times a day (QID) | INTRAMUSCULAR | Status: DC | PRN

## 2013-04-23 MED ORDER — KETOROLAC TROMETHAMINE 30 MG/ML IJ SOLN
INTRAMUSCULAR | Status: DC | PRN
  Administered 2013-04-23: 30 mg via INTRAVENOUS

## 2013-04-23 MED ORDER — LISINOPRIL-HYDROCHLOROTHIAZIDE 20-12.5 MG PO TABS: 1.0000 | ORAL_TABLET | Freq: Every morning | ORAL | Status: DC

## 2013-04-23 MED ORDER — BISACODYL 10 MG RE SUPP: 10.0000 mg | Freq: Two times a day (BID) | RECTAL | Status: DC | PRN

## 2013-04-23 MED ORDER — HYDROMORPHONE HCL PF 1 MG/ML IJ SOLN
0.2500 mg | INTRAMUSCULAR | Status: DC | PRN
  Administered 2013-04-23 (×2): 0.5 mg via INTRAVENOUS

## 2013-04-23 MED ORDER — ROCURONIUM BROMIDE 100 MG/10ML IV SOLN
INTRAVENOUS | Status: AC
  Filled 2013-04-23: qty 1

## 2013-04-23 MED ORDER — EPHEDRINE SULFATE 50 MG/ML IJ SOLN
INTRAMUSCULAR | Status: DC | PRN
  Administered 2013-04-23 (×2): 10 mg via INTRAVENOUS

## 2013-04-23 MED ORDER — DIPHENHYDRAMINE HCL 50 MG/ML IJ SOLN: 12.5000 mg | Freq: Four times a day (QID) | INTRAMUSCULAR | Status: DC | PRN

## 2013-04-23 MED ORDER — METOPROLOL TARTRATE 1 MG/ML IV SOLN
5.0000 mg | Freq: Four times a day (QID) | INTRAVENOUS | Status: DC | PRN
  Filled 2013-04-23: qty 5

## 2013-04-23 MED ORDER — FENTANYL CITRATE 0.05 MG/ML IJ SOLN
INTRAMUSCULAR | Status: DC | PRN
  Administered 2013-04-23: 50 ug via INTRAVENOUS
  Administered 2013-04-23 (×4): 100 ug via INTRAVENOUS

## 2013-04-23 MED ORDER — HYDROCHLOROTHIAZIDE 12.5 MG PO CAPS
12.5000 mg | ORAL_CAPSULE | Freq: Every day | ORAL | Status: DC
  Administered 2013-04-24: 12.5 mg via ORAL
  Filled 2013-04-23: qty 1

## 2013-04-23 MED ORDER — LIDOCAINE HCL (CARDIAC) 20 MG/ML IV SOLN
INTRAVENOUS | Status: AC
  Filled 2013-04-23: qty 5

## 2013-04-23 MED ORDER — HYDROCODONE-ACETAMINOPHEN 10-325 MG PO TABS
1.0000 | ORAL_TABLET | Freq: Four times a day (QID) | ORAL | Status: DC | PRN
  Administered 2013-04-23 – 2013-04-24 (×2): 2 via ORAL
  Filled 2013-04-23 (×2): qty 2

## 2013-04-23 MED ORDER — LIDOCAINE HCL (PF) 2 % IJ SOLN
INTRAMUSCULAR | Status: DC | PRN
  Administered 2013-04-23: 75 mg via INTRADERMAL

## 2013-04-23 MED ORDER — PHENYLEPHRINE 40 MCG/ML (10ML) SYRINGE FOR IV PUSH (FOR BLOOD PRESSURE SUPPORT)
PREFILLED_SYRINGE | INTRAVENOUS | Status: AC
  Filled 2013-04-23: qty 10

## 2013-04-23 MED ORDER — METRONIDAZOLE IN NACL 5-0.79 MG/ML-% IV SOLN
500.0000 mg | INTRAVENOUS | Status: AC
  Administered 2013-04-23: 500 mg via INTRAVENOUS
  Filled 2013-04-23: qty 100

## 2013-04-23 MED ORDER — SIMVASTATIN 20 MG PO TABS
20.0000 mg | ORAL_TABLET | Freq: Every day | ORAL | Status: DC
  Filled 2013-04-23: qty 1

## 2013-04-23 MED ORDER — AMITRIPTYLINE HCL 50 MG PO TABS
50.0000 mg | ORAL_TABLET | Freq: Every day | ORAL | Status: DC
  Administered 2013-04-23: 50 mg via ORAL
  Filled 2013-04-23 (×2): qty 1

## 2013-04-23 MED ORDER — BUPIVACAINE-EPINEPHRINE 0.25% -1:200000 IJ SOLN
INTRAMUSCULAR | Status: DC | PRN
  Administered 2013-04-23 (×3): 30 mL

## 2013-04-23 MED ORDER — GLYCOPYRROLATE 0.2 MG/ML IJ SOLN
INTRAMUSCULAR | Status: AC
  Filled 2013-04-23: qty 3

## 2013-04-23 MED ORDER — MAGNESIUM HYDROXIDE 400 MG/5ML PO SUSP: 30.0000 mL | Freq: Two times a day (BID) | ORAL | Status: DC | PRN

## 2013-04-23 MED ORDER — HEPARIN SODIUM (PORCINE) 5000 UNIT/ML IJ SOLN
5000.0000 [IU] | Freq: Three times a day (TID) | INTRAMUSCULAR | Status: DC
  Administered 2013-04-24: 5000 [IU] via SUBCUTANEOUS
  Filled 2013-04-23 (×4): qty 1

## 2013-04-23 MED ORDER — LISINOPRIL 20 MG PO TABS
20.0000 mg | ORAL_TABLET | Freq: Every day | ORAL | Status: DC
  Administered 2013-04-24: 20 mg via ORAL
  Filled 2013-04-23: qty 1

## 2013-04-23 MED ORDER — LACTATED RINGERS IV SOLN
INTRAVENOUS | Status: DC
  Administered 2013-04-23: 18:00:00 via INTRAVENOUS

## 2013-04-23 SURGICAL SUPPLY — 47 items
APPLIER CLIP 5 13 M/L LIGAMAX5 (MISCELLANEOUS)
APR CLP MED LRG 5 ANG JAW (MISCELLANEOUS)
BINDER ABD UNIV 12 45-62 (WOUND CARE) IMPLANT
BINDER ABDOMINAL 46IN 62IN (WOUND CARE) ×2
BLADE HEX COATED 2.75 (ELECTRODE) IMPLANT
CABLE HI FREQUENCY MONOPOLAR (ELECTROSURGICAL) ×2 IMPLANT
CANISTER SUCTION 2500CC (MISCELLANEOUS) ×2 IMPLANT
CATH KIT ON Q 7.5IN SLV (PAIN MANAGEMENT) ×2 IMPLANT
CHLORAPREP W/TINT 26ML (MISCELLANEOUS) ×2 IMPLANT
CLIP APPLIE 5 13 M/L LIGAMAX5 (MISCELLANEOUS) IMPLANT
DECANTER SPIKE VIAL GLASS SM (MISCELLANEOUS) ×2 IMPLANT
DEVICE SECURE STRAP 25 ABSORB (INSTRUMENTS) ×2 IMPLANT
DEVICE TROCAR PUNCTURE CLOSURE (ENDOMECHANICALS) ×2 IMPLANT
DRAPE LAPAROSCOPIC ABDOMINAL (DRAPES) ×2 IMPLANT
DRAPE UTILITY XL STRL (DRAPES) ×2 IMPLANT
DRAPE WARM FLUID 44X44 (DRAPE) ×2 IMPLANT
DRSG TEGADERM 2-3/8X2-3/4 SM (GAUZE/BANDAGES/DRESSINGS) ×6 IMPLANT
DRSG TEGADERM 4X4.75 (GAUZE/BANDAGES/DRESSINGS) ×1 IMPLANT
ELECT REM PT RETURN 9FT ADLT (ELECTROSURGICAL) ×2
ELECTRODE REM PT RTRN 9FT ADLT (ELECTROSURGICAL) ×1 IMPLANT
GAUZE SPONGE 2X2 8PLY STRL LF (GAUZE/BANDAGES/DRESSINGS) IMPLANT
GLOVE ECLIPSE 8.0 STRL XLNG CF (GLOVE) ×2 IMPLANT
GLOVE INDICATOR 8.0 STRL GRN (GLOVE) ×2 IMPLANT
GOWN STRL REIN XL XLG (GOWN DISPOSABLE) ×4 IMPLANT
KIT BASIN OR (CUSTOM PROCEDURE TRAY) ×2 IMPLANT
MARKER SKIN DUAL TIP RULER LAB (MISCELLANEOUS) ×2 IMPLANT
MESH VENTRALIGHT ST 10X13IN (Mesh General) ×1 IMPLANT
NDL SPNL 22GX3.5 QUINCKE BK (NEEDLE) IMPLANT
NEEDLE SPNL 22GX3.5 QUINCKE BK (NEEDLE) ×2 IMPLANT
PENCIL BUTTON HOLSTER BLD 10FT (ELECTRODE) IMPLANT
SCALPEL HARMONIC ACE (MISCELLANEOUS) IMPLANT
SCISSORS LAP 5X35 DISP (ENDOMECHANICALS) ×2 IMPLANT
SET IRRIG TUBING LAPAROSCOPIC (IRRIGATION / IRRIGATOR) IMPLANT
SLEEVE XCEL OPT CAN 5 100 (ENDOMECHANICALS) ×4 IMPLANT
SPONGE GAUZE 2X2 STER 10/PKG (GAUZE/BANDAGES/DRESSINGS) ×1
STRIP CLOSURE SKIN 1/2X4 (GAUZE/BANDAGES/DRESSINGS) ×4 IMPLANT
SUT MNCRL AB 4-0 PS2 18 (SUTURE) ×2 IMPLANT
SUT PDS AB 1 CT1 27 (SUTURE) ×2 IMPLANT
SUT PROLENE 1 CT 1 30 (SUTURE) ×15 IMPLANT
TOWEL OR 17X26 10 PK STRL BLUE (TOWEL DISPOSABLE) ×2 IMPLANT
TOWEL OR NON WOVEN STRL DISP B (DISPOSABLE) ×2 IMPLANT
TRAY FOLEY CATH 14FRSI W/METER (CATHETERS) IMPLANT
TRAY LAP CHOLE (CUSTOM PROCEDURE TRAY) ×2 IMPLANT
TROCAR BLADELESS OPT 5 100 (ENDOMECHANICALS) ×2 IMPLANT
TROCAR XCEL NON-BLD 11X100MML (ENDOMECHANICALS) IMPLANT
TUBING INSUFFLATION 10FT LAP (TUBING) ×2 IMPLANT
TUNNELER SHEATH ON-Q 16GX12 DP (PAIN MANAGEMENT) ×1 IMPLANT

## 2013-04-23 NOTE — Anesthesia Preprocedure Evaluation (Addendum)
Anesthesia Evaluation  Patient identified by MRN, date of birth, ID band Patient awake    Reviewed: Allergy & Precautions, H&P , NPO status , Patient's Chart, lab work & pertinent test results  Airway Mallampati: II TM Distance: >3 FB Neck ROM: Full    Dental  (+) Edentulous Upper and Edentulous Lower   Pulmonary neg pulmonary ROS,  breath sounds clear to auscultation  Pulmonary exam normal       Cardiovascular Exercise Tolerance: Good hypertension, Pt. on medications Rhythm:Regular Rate:Normal     Neuro/Psych negative neurological ROS  negative psych ROS   GI/Hepatic negative GI ROS, Neg liver ROS,   Endo/Other  Morbid obesity  Renal/GU negative Renal ROS  negative genitourinary   Musculoskeletal negative musculoskeletal ROS (+)   Abdominal (+) + obese,   Peds negative pediatric ROS (+)  Hematology negative hematology ROS (+)   Anesthesia Other Findings   Reproductive/Obstetrics negative OB ROS                          Anesthesia Physical Anesthesia Plan  ASA: III  Anesthesia Plan: General   Post-op Pain Management:    Induction: Intravenous  Airway Management Planned: Oral ETT  Additional Equipment:   Intra-op Plan:   Post-operative Plan: Extubation in OR  Informed Consent: I have reviewed the patients History and Physical, chart, labs and discussed the procedure including the risks, benefits and alternatives for the proposed anesthesia with the patient or authorized representative who has indicated his/her understanding and acceptance.   Dental advisory given  Plan Discussed with: CRNA  Anesthesia Plan Comments:         Anesthesia Quick Evaluation

## 2013-04-23 NOTE — H&P (Signed)
[name]  [birth year] [id number]  CARE TEAM:  PCP: Rudi Heap, MD  Outpatient Care Team: Patient Care Team: Ernestina Penna, MD as PCP - General (Family Medicine) Bernita Buffy. Duard Brady, MD as Consulting Physician (Gynecologic Oncology) Mardella Layman, MD as Consulting Physician (Gastroenterology) Crecencio Mc, MD as Consulting Physician (Urology)  Inpatient Treatment Team: Treatment Team: Attending Provider: Ardeth Sportsman, MD  This patient is a 67 y.o.female who presents today for surgical evaluation at the request of Dr. Duard Brady.   Reason for visit: Possible recurrent incisional hernia   Pleasant woman. Survivor of endometrial cancer. Underwent laparoscopic lysis lesions and small bowel resection for chronic jejunal diverticulum/stricture. No longer has any nausea vomiting or bowel obstruction-like symptoms. She has been disease-free on her endometrial cancer surgery. However, bulging was noted in the left abdomen. Concern for recurrent hernia. Surgical consultation requested.   She comes today with her daughter. Eating well. Moving her bowels well. In good spirits. Denies really any abdominal pain. No fevers chills or sweats. Having daily bowel movements. No history of infections. Walking with a cane.  She felt discomfort at the hernia site and wishes surgical repair   Past Medical History  Diagnosis Date  . Endometrioid adenocarcinoma   . Hypertension   . Obesity (BMI 30-39.9) 04/27/2012  . Arthritis   . Hyperlipidemia   . Ureteral adhesion, left 2010    s/p ureterolysis & stenting Dr. Dolores Lory now  . 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 Colon polyp, hyperplastic 04/27/2012    Colonoscopy 2010.  No adenomatous polyps   . History of endometrial cancer s/p TAH BSO June2013 06/15/2011    Grade 1 endometrioid adenocarcinoma with focal superficial myometrial invasion of 0.1 cm with a myometrium of 1.8 cm or  approximately 7%. She had a 5-cm tumor. No lymphovascular space Involvement.    . Mass of in fold of jejunum s/p SB resection 07/19/2012 07/20/2012  . UTI (urinary tract infection)     Past Surgical History  Procedure Laterality Date  . Abdominal hysterectomy  11/02/10    TAH, BSO, lysis of adhesions for endometrial cancer  . Incisional hernia repair  11/02/10  . Back surgery  1989, 1999, 2009    anterior L2-3, L3-4 arthrodesis  . Kidney surgery  2010    Left kidney  . Bladder suspension  2003  . Foot surgery  06/2010    left foot surgery  . Joint replacement  09/2009    Right total knee  . Total hip arthroplasty  01/2010    Right total hip  . Cataract extraction, bilateral  2003  . Total knee arthroplasty      right  . Ureterolysis  2010    lap/open left ureterolysis & omental flap, ureter stent  . Ventral hernia repair N/A 07/19/2012    Procedure: LAPAROSCOPIC LYSIS OF ADHESIONS, SMALL BOWEL RESECTION, SEROSAL REPAIR, PRIMARY VENTRAL HERNIA REPAIR;  Surgeon: Ardeth Sportsman, MD;  Location: [facility];  Service: General;  Laterality: N/A;  . Hernia repair  07/19/12    lap VWH repair  . Eye surgery    . Back surgery  12/17/2012    Spinal fusion and repair     History   Social History  . Marital Status: Widowed    Spouse Name: N/A    Number of Children: N/A  . Years of Education: N/A   Occupational History  . Not on file.   Social History Main Topics  .  Smoking status: Never Smoker   . Smokeless tobacco: Not on file  . Alcohol Use: No  . Drug Use: No  . Sexual Activity: No   Other Topics Concern  . Not on file   Social History Narrative  . No narrative on file    Family History  Problem Relation Age of Onset  . Diabetes Mother   . Cancer Mother     lung  . Diabetes Father   . Cancer Father     liver  . Colon cancer Sister   . Diabetes Sister   . Cancer Sister     colon    Current Facility-Administered Medications  Medication Dose Route Frequency Provider  Last Rate Last Dose  . [START ON 04/24/2013] chlorhexidine (HIBICLENS) 4 % liquid 1 application  1 application Topical Once Ardeth Sportsman, MD      . metroNIDAZOLE (FLAGYL) IVPB 500 mg  500 mg Intravenous On Call to OR Ardeth Sportsman, MD         Allergies  Allergen Reactions  . Cashew Nut Oil Hives  . Sulfamethoxazole     Mouth sores  . Zofran Hives and Nausea Only    ROS: Constitutional:  No fevers, chills, sweats.  Weight stable Eyes:  No vision changes, No discharge HENT:  No sore throats, nasal drainage Lymph: No neck swelling, No bruising easily Pulmonary:  No cough, productive sputum CV: No orthopnea, PND  Patient walks 20 minutes for about 1/2 miles without difficulty.  No exertional chest/neck/shoulder/arm pain. GI: No personal nor family history of GI/colon cancer, inflammatory bowel disease, irritable bowel syndrome, allergy such as Celiac Sprue, dietary/dairy problems, colitis, ulcers nor gastritis.  No recent sick contacts/gastroenteritis.  No travel outside the country.  No changes in diet. Renal: No UTIs, No hematuria Genital:  No drainage, bleeding, masses Musculoskeletal: No severe joint pain.  Good ROM major joints Skin:  No sores or lesions.  No rashes Heme/Lymph:  No easy bleeding.  No swollen lymph nodes Neuro: No focal weakness/numbness.  No seizures Psych: No suicidal ideation.  No hallucinations  BP 146/62  Pulse 53  Temp(Src) 97.6 F (36.4 C) (Oral)  Resp 18  SpO2 100%  Physical Exam: General: Pt awake/alert/oriented x4 in no major acute distress Eyes: PERRL, normal EOM. Sclera nonicteric Neuro: CN II-XII intact w/o focal sensory/motor deficits. Lymph: No head/neck/groin lymphadenopathy Psych:  No delerium/psychosis/paranoia HENT: Normocephalic, Mucus membranes moist.  No thrush Neck: Supple, No tracheal deviation Chest: No pain.  Good respiratory excursion. CV:  Pulses intact.  Regular rhythm Abdomen: Soft, Nondistended.  Periumbilical hernia  reducible.  Mild bulging on left subcostal incision.  Most likely due to diastasis/eventration and not true hernia. Nontender.  No incarcerated hernias. Ext:  SCDs BLE.  No significant edema.  No cyanosis Skin: No petechiae / purpurea.  No major sores Musculoskeletal: No severe joint pain.  Good ROM major joints   Results:   Labs: No results found for this or any previous visit (from the past 48 hour(s)).  Imaging / Studies: No results found.  Medications / Allergies: per chart  Antibiotics: Anti-infectives   Start     Dose/Rate Route Frequency Ordered Stop   04/23/13 0900  metroNIDAZOLE (FLAGYL) IVPB 500 mg    Comments:  Pharmacy may adjust dosing strength, interval, or rate of medication as needed for optimal therapy for the patient Send with patient on call to the OR.  Anesthesia to complete antibiotic administration <9min prior to [facility].  500 mg 100 mL/hr over 60 Minutes Intravenous On call to O.R. 04/23/13 0838 04/24/13 0559      Assessment  [name]  67 y.o. female  Day of Surgery  Procedure(s): LAPAROSCOPIC exploration and repair of hernia in abdominal VENTRAL wall  HERNIA INSERTION OF MESH  Problem List:  Active Problems:   * No active hospital problems. *  Recurrent periumbilical ventral hernia. Second recurrence.   Laxity of left abdominal wall more likely to chronic nerve damage from prior left subcostal incision.   Plan:   Standard of care would to be providing a repair of that hernia to avoid recurrence. She is concerned about possibility of incarceration/strangulation and wishes to have repair. This would allow me to do diagnostic laparoscopy in the left abdomen as well. I do not see any evidence of hernia on that side was able to see her incision while over there, so I am skeptical if there is recurrence there. I did caution her she has a risk of bowel injury pain and recurrence. However, she tolerated the lap LOA & SB  resection last time is feeling better. Hopefully her adhesions will be less this time around. She failed primary repair x2 periumbilically, so I do not see a way to avoid mesh. Since no need for bowel resection and nonemergency, good likelihood that can use mesh to help decrease the chance of recurrence.   The anatomy & physiology of the abdominal wall was discussed. The pathophysiology of hernias was discussed. Natural history risks without surgery including progeressive enlargement, pain, incarceration & strangulation was discussed. Contributors to complications such as smoking, obesity, diabetes, prior surgery, etc were discussed.   I feel the risks of no intervention will lead to serious problems that outweigh the operative risks; therefore, I recommended surgery to reduce and repair the hernia. I explained laparoscopic techniques with possible need for an open approach. I noted the probable use of mesh to patch and/or buttress the hernia repair   Risks such as bleeding, infection, abscess, need for further treatment, heart attack, death, and other risks were discussed. I noted a good likelihood this will help address the problem. Goals of post-operative recovery were discussed as well. Possibility that this will not correct all symptoms was explained. I stressed the importance of low-impact activity, aggressive pain control, avoiding constipation, & not pushing through pain to minimize risk of post-operative chronic pain or injury. Possibility of reherniation especially with smoking, obesity, diabetes, immunosuppression, and other health conditions was discussed. We will work to minimize complications.   An educational handout further explaining the pathology & treatment options has been given as well. Questions were answered. The patient expresses understanding & wishes to proceed with surgery.      Ardeth Sportsman, M.D., F.A.C.S. Gastrointestinal and Minimally Invasive Surgery [facility] [address], [address] [place], [place] [address] [phone] Main / Paging   04/23/2013

## 2013-04-23 NOTE — Transfer of Care (Signed)
Immediate Anesthesia Transfer of Care Note  Patient: [name]  Procedure(s) Performed: Procedure(s): LAPAROSCOPIC exploration and repair of hernia in abdominal VENTRAL wall  HERNIA (N/A) INSERTION OF MESH (N/A) LAPAROSCOPIC LYSIS OF ADHESIONS (N/A)  Patient Location: PACU  Anesthesia Type:General  Level of Consciousness: awake, alert , oriented and patient cooperative  Airway & Oxygen Therapy: Patient Spontanous Breathing and Patient connected to face mask oxygen  Post-op Assessment: Report given to PACU RN and Post -op Vital signs reviewed and stable  Post vital signs: Reviewed and stable  Complications: No apparent anesthesia complications

## 2013-04-23 NOTE — Anesthesia Postprocedure Evaluation (Signed)
  Anesthesia Post-op Note  Patient: [name]  Procedure(s) Performed: Procedure(s) (LRB): LAPAROSCOPIC exploration and repair of hernia in abdominal VENTRAL wall  HERNIA (N/A) INSERTION OF MESH (N/A) LAPAROSCOPIC LYSIS OF ADHESIONS (N/A)  Patient Location: PACU  Anesthesia Type: General  Level of Consciousness: awake and alert   Airway and Oxygen Therapy: Patient Spontanous Breathing  Post-op Pain: mild  Post-op Assessment: Post-op Vital signs reviewed, Patient's Cardiovascular Status Stable, Respiratory Function Stable, Patent Airway and No signs of Nausea or vomiting  Last Vitals:  Filed Vitals:   04/23/13 1543  BP: 108/64  Pulse: 75  Temp: 36.9 C  Resp: 18    Post-op Vital Signs: stable   Complications: No apparent anesthesia complications

## 2013-04-23 NOTE — Op Note (Signed)
04/23/2013  1:59 PM  PATIENT:  [name]  67 y.o. female  Patient Care Team: Ernestina Penna, MD as PCP - General (Family Medicine) Bernita Buffy. Duard Brady, MD as Consulting Physician (Gynecologic Oncology) Mardella Layman, MD as Consulting Physician (Gastroenterology) Crecencio Mc, MD as Consulting Physician (Urology)  PRE-OPERATIVE DIAGNOSIS:  Recurrent incisional ventral wall hernia   POST-OPERATIVE DIAGNOSIS:  Recurrent incisional ventral wall hernia   PROCEDURE:  Procedure(s): LAPAROSCOPIC LYSIS OF ADHESIONS x 45 min (1/3 case) LAPAROSCOPIC repair of Recurrent incisional ventral wall hernia Primary repair of supraumbilical incisional ventral hernia INSERTION OF MESH  Surgeon(s): Ardeth Sportsman, MD  ASSISTANT: RN   ANESTHESIA:   local and general  EBL:     Delay start of Pharmacological VTE agent (>24hrs) due to surgical blood loss or risk of bleeding:  no  DRAINS: none   SPECIMEN:  No Specimen  DISPOSITION OF SPECIMEN:  N/A  COUNTS:  YES  PLAN OF CARE: Admit for overnight observation  PATIENT DISPOSITION:  PACU - hemodynamically stable.  INDICATION: Pleasant patient has developed a ventral wall abdominal hernia.   Recommendation was made for surgical repair:  The anatomy & physiology of the abdominal wall was discussed. The pathophysiology of hernias was discussed. Natural history risks without surgery including progeressive enlargement, pain, incarceration & strangulation was discussed. Contributors to complications such as smoking, obesity, diabetes, prior surgery, etc were discussed.  I feel the risks of no intervention will lead to serious problems that outweigh the operative risks; therefore, I recommended surgery to reduce and repair the hernia. I explained laparoscopic techniques with possible need for an open approach. I noted the probable use of mesh to patch and/or buttress the hernia repair  Risks such as bleeding, infection, abscess, need for further  treatment, heart attack, death, and other risks were discussed. I noted a good likelihood this will help address the problem. Goals of post-operative recovery were discussed as well. Possibility that this will not correct all symptoms was explained. I stressed the importance of low-impact activity, aggressive pain control, avoiding constipation, & not pushing through pain to minimize risk of post-operative chronic pain or injury. Possibility of reherniation especially with smoking, obesity, diabetes, immunosuppression, and other health conditions was discussed. We will work to minimize complications.  An educational handout further explaining the pathology & treatment options was given as well. Questions were answered. The patient expresses understanding & wishes to proceed with surgery.   OR FINDINGS: 13 x 7 cm Swiss cheese region of hernias along supraumbilical midline ventral hernia.  Old left flank incision with adhesions but no evidence of hernia there.  No evidence of carcinomatosis/peritoneal disease/metastatic disease   Type of repair - Laparoscopic underlay repair   Name of mesh - Bard Ventralight dual sided (polypropylene / Seprafilm)  Size of mesh - Length 33 cm, Width 25 cm  Mesh overlap - 5-7 cm  Placement of mesh - Intraperitoneal underlay repair   DESCRIPTION:   Informed consent was confirmed. The patient underwent general anaesthesia without difficulty. The patient was positioned appropriately. VTE prevention in place. The patient's abdomen was clipped, prepped, & draped in a sterile fashion. Surgical timeout confirmed our plan.  The patient was positioned in reverse Trendelenburg. Abdominal entry was gained using optical entry technique in the left upper abdomen. Entry was clean. I induced carbon dioxide insufflation. Camera inspection revealed no injury. Extra ports were carefully placed under direct laparoscopic visualization.   Patient had moderately dense adhesions of  primarily greater  omentum and the central abdomen, especially supraumbilically.  I did a careful sharp lysis of adhesions to expose the entire anterior abdominal wall.  I primarily used and focused cold scissors.    I could see the obvious periumbilical ventral hernia.  There are a few smaller Swiss cheese hernias petting towards the apex of the incision.  I also freed adhesions off the left anterior flank incision.  There are no evidence of hernias along the old incision there   I made sure hemostasis was good.  I mapped out the region using a needle passer.   To ensure that I would have at least 5 cm radial coverage outside of the hernia defect, I chose a 33x25 cm dual sided mesh.  I placed #1 Prolene stitches around its edge about every 5 cm = 18 total.  I rolled the mesh & placed into the peritoneal cavity through the most cephalad supraumbilical ventral incisional hernia 2 cm fascial defect.  I unrolled the mesh and positioned it appropriately.  I secured the mesh to cover up the hernia defect using a laparoscopic suture passer to pass the tails of the Prolene through the abdominal wall & tagged them with clamps.  I started out in four corners to make sure I had the mesh centered over the hernia defect appropriately, and then proceeded to work in quadrants.  We evacuated CO2 & desufflated the abdomen.  I tied the fascial stitches down.  I reinsufflated the abdomen.  The mesh provided at least 5-10 cm circumferential coverage around the entire region of hernia defects.   I tacked the edges & central part of the mesh to the peritoneum/posterior rectus fascia with SecureStrap absorbable tacks.   Hemostasis was excellent.  I closed the fascia port site on the 10 mm port using a 0 Vicryl stitch using laparoscopic intracorporeal suture passer. I then placed On-Q catheter sheaths in the preperitoneal plane under direct laparoscopic guidance from the subxiphoid region down to the posterior iliac crests in the lower  flanks. I did reinspection. Hemostasis was good. Mesh laid well. Capnoperitoneum was evacuated. Ports were removed. The skin was closed with Monocryl at the port sites and Steri-Strips on the fascial stitch puncture sites. OnQ catheters were placed and the sheathes peeled away. On-Q pump was secured. Patient is being extubated to go to the recovery room. I'm about discussed operative findings with the patient's family.

## 2013-04-23 NOTE — Progress Notes (Signed)
Pt arrived to floor from PACU in stable condition.  Pt walked to BR to void, tolerated clear liquid dinner, and ambulated in hallway approx 50 ft.  PO pain meds provided to pt, tolerated well.

## 2013-04-24 ENCOUNTER — Encounter (HOSPITAL_COMMUNITY): Payer: Self-pay | Admitting: Surgery

## 2013-04-24 MED ORDER — DOCUSATE SODIUM 100 MG PO CAPS
100.0000 mg | ORAL_CAPSULE | Freq: Two times a day (BID) | ORAL | Status: DC
  Administered 2013-04-24: 100 mg via ORAL
  Filled 2013-04-24 (×2): qty 1

## 2013-04-24 MED ORDER — LACTATED RINGERS IV BOLUS (SEPSIS): 1000.0000 mL | Freq: Three times a day (TID) | INTRAVENOUS | Status: DC | PRN

## 2013-04-24 MED ORDER — SODIUM CHLORIDE 0.9 % IJ SOLN: 3.0000 mL | INTRAMUSCULAR | Status: DC | PRN

## 2013-04-24 MED ORDER — PSYLLIUM 95 % PO PACK
1.0000 | PACK | Freq: Two times a day (BID) | ORAL | Status: DC
  Administered 2013-04-24: 1 via ORAL
  Filled 2013-04-24 (×2): qty 1

## 2013-04-24 MED ORDER — SODIUM CHLORIDE 0.9 % IJ SOLN
3.0000 mL | Freq: Two times a day (BID) | INTRAMUSCULAR | Status: DC
  Administered 2013-04-24: 3 mL via INTRAVENOUS

## 2013-04-24 NOTE — Discharge Summary (Signed)
Physician Discharge Summary  Patient ID: [name] MRN: [id number] DOB/AGE: Nov 08, 1945 67 y.o.  Admit date: 04/23/2013 Discharge date: 04/24/2013  Admission Diagnoses:  Discharge Diagnoses:  Principal Problem:   Recurrent ventral incisional hernia s/p primary repair 07/19/2012 Active Problems:   History of endometrial cancer s/p TAH BSO June2013   Hypertension   Obesity (BMI 30-39.9)   Chronic pain   Nocturnal leg cramps   Pseudoarthrosis L3- L5   Discharged Condition: good  Hospital Course:  Pleasant obese female with recurrent incisional hernia causing pain.  Concern of prior obstruction and then another one would occur.  She wished surgery done.  This was done laparoscopically with moderate laparoscopic lysis of adhesions.Postoperatively, the patient mobilized in the hallways and advanced to a solid diet gradually.  Pain was well-controlled and transitioned off IV medications.    By the time of discharge, the patient was walking well the hallways, eating food well, having flatus.  Pain was-controlled on an oral regimen.  Based on meeting DC criteria and recovering well, I felt it was safe for the patient to be discharged home with close followup.  Instructions were discussed in detail.  They are written as well.     Consults: None  Significant Diagnostic Studies:   Treatments: surgery: POST-OPERATIVE DIAGNOSIS: Recurrent incisional ventral wall hernia  PROCEDURE: Procedure(s):  LAPAROSCOPIC LYSIS OF ADHESIONS x 45 min (1/3 case)  LAPAROSCOPIC repair of Recurrent incisional ventral wall hernia  Primary repair of supraumbilical incisional ventral hernia  INSERTION OF MESH  Surgeon(s):  Ardeth Sportsman, MD   Discharge Exam: Blood pressure 115/47, pulse 55, temperature 98.1 F (36.7 C), temperature source Oral, resp. rate 18, height 5' (1.524 m), weight 204 lb (92.534 kg), SpO2 95.00%.  General: Pt awake/alert/oriented x4 in no major acute distress Eyes: PERRL,  normal EOM. Sclera nonicteric Neuro: CN II-XII intact w/o focal sensory/motor deficits. Lymph: No head/neck/groin lymphadenopathy Psych:  No delerium/psychosis/paranoia HENT: Normocephalic, Mucus membranes moist.  No thrush Neck: Supple, No tracheal deviation Chest: No pain.  Good respiratory excursion. CV:  Pulses intact.  Regular rhythm MS: Normal AROM mjr joints.  No obvious deformity Abdomen: Soft, Nondistended.  Min tender at lap incisions.  No incarcerated hernias. Ext:  SCDs BLE.  No significant edema.  No cyanosis Skin: No petechiae / purpura   Disposition: 01-Home or Self Care  Discharge Orders   Future Appointments Provider Department Dept Phone   05/07/2013 10:00 AM [facility] [place]  IMAGING [phone]   Please wear two piece clothing and wear no powder or deodorant. Please arrive 15 minutes early prior to your appointment time.   06/25/2013 9:00 AM Ileana Ladd, MD [facility] [phone]   Future Orders Complete By Expires   Call MD for:  extreme fatigue  As directed    Call MD for:  hives  As directed    Call MD for:  persistant nausea and vomiting  As directed    Call MD for:  redness, tenderness, or signs of infection (pain, swelling, redness, odor or green/yellow discharge around incision site)  As directed    Call MD for:  severe uncontrolled pain  As directed    Call MD for:  As directed    Comments:     Temperature > 101.55F   Diet - low sodium heart healthy  As directed    Discharge instructions  As directed    Comments:     Please see discharge instruction  sheets.  Also refer to handout given an office.  Please call our office if you have any questions or concerns [phone]   Discharge wound care:  As directed    Comments:     If you have closed incisions, shower and bathe over these incisions with soap and water every day.  Remove all surgical dressings on postoperative day #3.  You do not need to  replace dressings over the closed incisions unless you feel more comfortable with a Band-Aid covering it.   If you have an open wound that requires packing, please see wound care instructions.  In general, remove all dressings, wash wound with soap and water and then replace with saline moistened gauze.  Do the dressing change at least every day.  Please call our office [phone] if you have further questions.   Driving Restrictions  As directed    Comments:     No driving until off narcotics and can safely swerve away without pain during an emergency   Increase activity slowly  As directed    Comments:     Walk an hour a day.  Use 20-30 minute walks.  When you can walk 30 minutes without difficulty, increase to low impact/moderate activities such as biking, jogging, swimming, sexual activity..  Eventually can increase to unrestricted activity when not feeling pain.  If you feel pain: STOP!Marland Kitchen   Let pain protect you from overdoing it.  Use ice/heat/over-the-counter pain medications to help minimize his soreness.  Use pain prescriptions as needed to remain active.  It is better to take extra pain medications and be more active than to stay bedridden to avoid all pain medications.   Lifting restrictions  As directed    Comments:     Avoid heavy lifting initially.  Do not push through pain.  You have no specific weight limit.  Coughing and sneezing or four more stressful to your incision than any lifting you will do. Pain will protect you from injury.  Therefore, avoid intense activity until off all narcotic pain medications.  Coughing and sneezing or four more stressful to your incision than any lifting he will do.   May shower / Bathe  As directed    May walk up steps  As directed    Sexual Activity Restrictions  As directed    Comments:     Sexual activity as tolerated.  Do not push through pain.  Pain will protect you from injury.   Walk with assistance  As directed    Comments:     Walk over  an hour a day.  May use a walker/cane/companion to help with balance and stamina.       Medication List    STOP taking these medications       acetaminophen-codeine 300-15 MG per tablet  Commonly known as:  TYLENOL #2     HYDROcodone-acetaminophen 5-325 MG per tablet  Commonly known as:  NORCO/VICODIN  Replaced by:  HYDROcodone-acetaminophen 10-325 MG per tablet      TAKE these medications       amitriptyline 50 MG tablet  Commonly known as:  ELAVIL  Take 50 mg by mouth at bedtime.     docusate sodium 100 MG capsule  Commonly known as:  COLACE  Take 100 mg by mouth at bedtime.     fish oil-omega-3 fatty acids 1000 MG capsule  Take 1 g by mouth daily.     gabapentin 300 MG capsule  Commonly known as:  NEURONTIN  Take 1 capsule (300 mg total) by mouth at bedtime.     HYDROcodone-acetaminophen 10-325 MG per tablet  Commonly known as:  NORCO  Take 1-2 tablets by mouth every 6 (six) hours as needed.     lisinopril-hydrochlorothiazide 20-12.5 MG per tablet  Commonly known as:  PRINZIDE,ZESTORETIC  Take 1 tablet by mouth every morning.     meloxicam 15 MG tablet  Commonly known as:  MOBIC  Take 1 tablet (15 mg total) by mouth every morning.     simvastatin 20 MG tablet  Commonly known as:  ZOCOR  Take 1 tablet (20 mg total) by mouth at bedtime.           Follow-up Information   Follow up with Annikah Lovins C., MD. Schedule an appointment as soon as possible for a visit in 3 weeks.   Specialty:  General Surgery   Contact information:   [address] [place] [address] [phone]       Signed: Ardeth Sportsman. 04/24/2013, 9:28 AM

## 2013-04-24 NOTE — Progress Notes (Signed)
[name] [id number] 07-Sep-1945  CARE TEAM:  PCP: Rudi Heap, MD  Outpatient Care Team: Patient Care Team: Ernestina Penna, MD as PCP - General (Family Medicine) Bernita Buffy. Duard Brady, MD as Consulting Physician (Gynecologic Oncology) Mardella Layman, MD as Consulting Physician (Gastroenterology) Crecencio Mc, MD as Consulting Physician (Urology)  Inpatient Treatment Team: Treatment Team: Attending Provider: Ardeth Sportsman, MD; Registered Nurse: Bobbye Morton, RN; Technician: Betsey Holiday, NT; Registered Nurse: Hartley Barefoot, RN   Subjective:  Sore.  Pain medicine helps.  Not really mobilize yet.  Tolerating liquids.  Objective:  Vital signs:  Filed Vitals:   04/23/13 1530 04/23/13 1543 04/23/13 2205 04/24/13 0449  BP: 121/57 108/64 121/54 115/47  Pulse: 63 75 59 55  Temp: 98 F (36.7 C) 98.4 F (36.9 C) 98 F (36.7 C) 98.1 F (36.7 C)  TempSrc:   Oral Oral  Resp: 15 18 16 18   Height:      Weight:      SpO2: 97% 98% 97% 95%    Last BM Date: 04/22/13  Intake/Output   Yesterday:  12/16 0701 - 12/17 0700 In: 1450 [P.O.:600; I.V.:850] Out: 1200 [Urine:1200] This shift:  Total I/O In: 240 [P.O.:240] Out: -   Bowel function:  Flatus: n  BM: n  Drain: n/a  Physical Exam:  General: Pt awake/alert/oriented x4 in no acute distress Eyes: PERRL, normal EOM.  Sclera clear.  No icterus Neuro: CN II-XII intact w/o focal sensory/motor deficits. Lymph: No head/neck/groin lymphadenopathy Psych:  No delerium/psychosis/paranoia HENT: Normocephalic, Mucus membranes moist.  No thrush Neck: Supple, No tracheal deviation Chest: No chest wall pain w good excursion CV:  Pulses intact.  Regular rhythm MS: Normal AROM mjr joints.  No obvious deformity Abdomen: Soft.  Nondistended.  Mildly tender at incisions only.  No evidence of peritonitis.  No incarcerated hernias. Ext:  SCDs BLE.  No mjr edema.  No cyanosis Skin: No petechiae / purpura   Problem  List:   Principal Problem:   Recurrent ventral incisional hernia s/p primary repair 07/19/2012 Active Problems:   History of endometrial cancer s/p TAH BSO June2013   Obesity (BMI 30-39.9)   Chronic pain   Assessment  [name]  67 y.o. female  1 Day Post-Op  Procedure(s): LAPAROSCOPIC exploration and repair of hernia in abdominal VENTRAL wall  HERNIA INSERTION OF MESH LAPAROSCOPIC LYSIS OF ADHESIONS  Recovering status post laparoscopic repair of recurrent incisional hernias with giant mesh  Plan:  -adv diet -inc pain control including chronic pain regimen. -bowel regimen -control HTN -VTE prophylaxis- SCDs, etc -mobilize as tolerated to help recovery  D/C patient from hospital when patient meets criteria (anticipate in 1-2 day(s)):  Tolerating oral intake well Ambulating in walkways Adequate pain control without IV medications Urinating  Having flatus   Ardeth Sportsman, M.D., F.A.C.S. Gastrointestinal and Minimally Invasive Surgery [facility] [address] [place], [place] [address] [phone] Main / Paging   04/24/2013   Results:   Labs: No results found for this or any previous visit (from the past 48 hour(s)).  Imaging / Studies: No results found.  Medications / Allergies: per chart  Antibiotics: Anti-infectives   Start     Dose/Rate Route Frequency Ordered Stop   04/23/13 1700  ceFAZolin (ANCEF) IVPB 2 g/50 mL premix     2 g 100 mL/hr over 30 Minutes Intravenous Every 6 hours 04/23/13 1619 04/24/13 0544   04/23/13 1045  ceFAZolin (ANCEF)  IVPB 2 g/50 mL premix    Comments:  Pharmacy may adjust dosing strength, interval, or rate of medication as needed for optimal therapy for the patient  Send with patient on call to the OR.  Anesthesia to complete antibiotic administration <60min prior to incision per [facility].   2 g 100 mL/hr over 30 Minutes Intravenous On call to O.R. 04/23/13 1040 04/23/13  1054   04/23/13 0900  metroNIDAZOLE (FLAGYL) IVPB 500 mg    Comments:  Pharmacy may adjust dosing strength, interval, or rate of medication as needed for optimal therapy for the patient Send with patient on call to the OR.  Anesthesia to complete antibiotic administration <71min prior to incision per [facility].   500 mg 100 mL/hr over 60 Minutes Intravenous On call to O.R. 04/23/13 1478 04/23/13 1130

## 2013-05-03 ENCOUNTER — Ambulatory Visit (INDEPENDENT_AMBULATORY_CARE_PROVIDER_SITE_OTHER): Payer: Medicare Other | Admitting: Nurse Practitioner

## 2013-05-03 ENCOUNTER — Encounter: Payer: Self-pay | Admitting: Nurse Practitioner

## 2013-05-03 VITALS — BP 138/90 | HR 101 | Temp 100.1°F | Ht 60.0 in | Wt 203.0 lb

## 2013-05-03 DIAGNOSIS — N390 Urinary tract infection, site not specified: Secondary | ICD-10-CM

## 2013-05-03 LAB — POCT URINALYSIS DIPSTICK
Bilirubin, UA: NEGATIVE
Ketones, UA: NEGATIVE
Protein, UA: NEGATIVE
Urobilinogen, UA: NEGATIVE

## 2013-05-03 LAB — POCT UA - MICROSCOPIC ONLY
Casts, Ur, LPF, POC: NEGATIVE
Crystals, Ur, HPF, POC: NEGATIVE

## 2013-05-03 MED ORDER — CIPROFLOXACIN HCL 500 MG PO TABS: 500.0000 mg | ORAL_TABLET | Freq: Two times a day (BID) | ORAL | Status: DC

## 2013-05-03 NOTE — Patient Instructions (Signed)
Urinary Tract Infection  Urinary tract infections (UTIs) can develop anywhere along your urinary tract. Your urinary tract is your body's drainage system for removing wastes and extra water. Your urinary tract includes two kidneys, two ureters, a bladder, and a urethra. Your kidneys are a pair of bean-shaped organs. Each kidney is about the size of your fist. They are located below your ribs, one on each side of your spine.  CAUSES  Infections are caused by microbes, which are microscopic organisms, including fungi, viruses, and bacteria. These organisms are so small that they can only be seen through a microscope. Bacteria are the microbes that most commonly cause UTIs.  SYMPTOMS   Symptoms of UTIs may vary by age and gender of the patient and by the location of the infection. Symptoms in young women typically include a frequent and intense urge to urinate and a painful, burning feeling in the bladder or urethra during urination. Older women and men are more likely to be tired, shaky, and weak and have muscle aches and abdominal pain. A fever may mean the infection is in your kidneys. Other symptoms of a kidney infection include pain in your back or sides below the ribs, nausea, and vomiting.  DIAGNOSIS  To diagnose a UTI, your caregiver will ask you about your symptoms. Your caregiver also will ask to provide a urine sample. The urine sample will be tested for bacteria and white blood cells. White blood cells are made by your body to help fight infection.  TREATMENT   Typically, UTIs can be treated with medication. Because most UTIs are caused by a bacterial infection, they usually can be treated with the use of antibiotics. The choice of antibiotic and length of treatment depend on your symptoms and the type of bacteria causing your infection.  HOME CARE INSTRUCTIONS   If you were prescribed antibiotics, take them exactly as your caregiver instructs you. Finish the medication even if you feel better after you  have only taken some of the medication.   Drink enough water and fluids to keep your urine clear or pale yellow.   Avoid caffeine, tea, and carbonated beverages. They tend to irritate your bladder.   Empty your bladder often. Avoid holding urine for long periods of time.   Empty your bladder before and after sexual intercourse.   After a bowel movement, women should cleanse from front to back. Use each tissue only once.  SEEK MEDICAL CARE IF:    You have back pain.   You develop a fever.   Your symptoms do not begin to resolve within 3 days.  SEEK IMMEDIATE MEDICAL CARE IF:    You have severe back pain or lower abdominal pain.   You develop chills.   You have nausea or vomiting.   You have continued burning or discomfort with urination.  MAKE SURE YOU:    Understand these instructions.   Will watch your condition.   Will get help right away if you are not doing well or get worse.  Document Released: 02/02/2005 Document Revised: 10/25/2011 Document Reviewed: 06/03/2011  Exit[employer] Patient Information 2014 [employer].

## 2013-05-03 NOTE — Progress Notes (Signed)
   Subjective:    Patient ID: [name], female    DOB: 07-May-1946, 68 y.o.   MRN: [id number]  HPI Patient in c/o dysuria and frequency- started about 2 days ago- no OTC meds    Review of Systems  Constitutional: Positive for fever.  Eyes: Negative.   Respiratory: Negative.   Genitourinary: Positive for dysuria, urgency and frequency.       Objective:   Physical Exam  Constitutional: She appears well-developed and well-nourished.  Cardiovascular: Normal rate, regular rhythm and normal heart sounds.   Pulmonary/Chest: Effort normal and breath sounds normal.  Abdominal: Soft. Bowel sounds are normal.  Genitourinary:  No CVA tenderness   BP 138/90  Pulse 101  Temp(Src) 100.1 F (37.8 C) (Oral)  Ht 5' (1.524 m)  Wt 203 lb (92.08 kg)  BMI 39.65 kg/m2 Results for orders placed in visit on 05/03/13  POCT UA - MICROSCOPIC ONLY      Result Value Range   WBC, Ur, HPF, POC TNTC     RBC, urine, microscopic TNTC     Bacteria, U Microscopic OCC     Mucus, UA NEG     Epithelial cells, urine per micros OCC     Crystals, Ur, HPF, POC NEG     Casts, Ur, LPF, POC NEG     Yeast, UA NEG    POCT URINALYSIS DIPSTICK      Result Value Range   Color, UA YELLOW     Clarity, UA CLOUDY     Glucose, UA NEG     Bilirubin, UA NEG     Ketones, UA NEG     Spec Grav, UA <=1.005     Blood, UA MOD     pH, UA 7.5     Protein, UA NEG     Urobilinogen, UA negative     Nitrite, UA NEG     Leukocytes, UA large (3+)            Assessment & Plan:   1. Urinary tract infection, site not specified    Meds ordered this encounter  Medications  . ciprofloxacin (CIPRO) 500 MG tablet    Sig: Take 1 tablet (500 mg total) by mouth 2 (two) times daily.    Dispense:  20 tablet    Refill:  0    Order Specific Question:  Supervising Provider    Answer:  Deborra Medina   Force fluids Rest AZO OTC for dysuria RTO prn  Mary-Margaret Daphine Deutscher, FNP

## 2013-05-06 LAB — URINE CULTURE

## 2013-05-14 ENCOUNTER — Ambulatory Visit
Admission: RE | Admit: 2013-05-14 | Discharge: 2013-05-14 | Disposition: A | Discharge status: Home / Self Care | Payer: Medicare Other | Source: Ambulatory Visit | Attending: Family Medicine | Admitting: Family Medicine

## 2013-05-14 ENCOUNTER — Encounter (INDEPENDENT_AMBULATORY_CARE_PROVIDER_SITE_OTHER): Payer: Self-pay | Admitting: Surgery

## 2013-05-14 ENCOUNTER — Ambulatory Visit (INDEPENDENT_AMBULATORY_CARE_PROVIDER_SITE_OTHER): Payer: Medicare Other | Admitting: Surgery

## 2013-05-14 VITALS — BP 130/78 | HR 100 | Temp 98.3°F | Resp 15 | Ht 60.0 in | Wt 191.8 lb

## 2013-05-14 DIAGNOSIS — K432 Incisional hernia without obstruction or gangrene: Secondary | ICD-10-CM

## 2013-05-14 DIAGNOSIS — N63 Unspecified lump in unspecified breast: Secondary | ICD-10-CM

## 2013-05-14 MED ORDER — HYDROCODONE-ACETAMINOPHEN 10-325 MG PO TABS: 1.0000 | ORAL_TABLET | Freq: Four times a day (QID) | ORAL | Status: DC | PRN

## 2013-05-14 NOTE — Progress Notes (Signed)
Subjective:     Patient ID: [name], female   DOB: April 26, 1946, 68 y.o.   MRN: [id number]  HPI  Note: This dictation was prepared with Dragon/digital dictation along with [facility] technology. Any transcriptional errors that result from this process are unintentional.       [name]  1945-06-21 [id number]  Patient Care Team: Chipper Herb, MD as PCP - General (Family Medicine) Lucita Lora. Alycia Rossetti, MD as Consulting Physician (Gynecologic Oncology) Sable Feil, MD as Consulting Physician (Gastroenterology) Dutch Gray, MD as Consulting Physician (Urology)  Procedure (Date: 04/23/2013):  POST-OPERATIVE DIAGNOSIS: Recurrent incisional ventral wall hernia   PROCEDURE: Procedure(s):  LAPAROSCOPIC LYSIS OF ADHESIONS x 45 min (1/3 case)  LAPAROSCOPIC repair of Recurrent incisional ventral wall hernia  Primary repair of supraumbilical incisional ventral hernia  INSERTION OF MESH   Surgeon(s):  Adin Hector, MD   OR FINDINGS: 13 x 7 cm Swiss cheese region of hernias along supraumbilical midline ventral hernia.  Old left flank incision with adhesions but no evidence of hernia there.  No evidence of carcinomatosis/peritoneal disease/metastatic disease  Type of repair - Laparoscopic underlay repair  Name of mesh - Bard Ventralight dual sided (polypropylene / Seprafilm)  Size of mesh - Length 33 cm, Width 25 cm  Mesh overlap - 5-7 cm  Placement of mesh - Intraperitoneal underlay repair   This patient returns for surgical re-evaluation.  She comes in today with her daughter.  She is doing well.  She finished her narcotics.  She wonders if she did have a few more.  However, she feels like her pain is relatively mild and manageable.  In good spirits.  She did have a couple days where she felt like she had the flu with a decreased appetite.  No nausea or vomiting.  No diarrhea.  Eating better.  Did have some bladder discomfort.  History of recurrent urinary tract  infections.  She suspected a recurrent UTI came back.  Was evaluated.  Started on Cipro.  No real improvement.  Due to visit her urologist later today.  Patient Active Problem List   Diagnosis Date Noted  . Colon polyp, hyperplastic 02/21/2013  . 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 Contusion shoulder/arm 02/21/2013  . Pseudoarthrosis L3- L5 12/17/2012  . Nocturnal leg cramps 11/13/2012  . HLD (hyperlipidemia) 08/10/2012  . Recurrent ventral incisional hernia s/p lap repair 04/23/2013 04/27/2012  . Obesity (BMI 30-39.9) 04/27/2012  . Chronic pain 04/27/2012  . Hypokalemia 04/27/2012  . Hypertension   . History of endometrial cancer s/p TAH BSO June2013 06/15/2011    Past Medical History  Diagnosis Date  . Endometrioid adenocarcinoma   . Hypertension   . Obesity (BMI 30-39.9) 04/27/2012  . Arthritis   . Hyperlipidemia   . Ureteral adhesion, left 2010    s/p ureterolysis & stenting Dr. Larwance Sachs now  . 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 Colon polyp, hyperplastic 04/27/2012    Colonoscopy 2010.  No adenomatous polyps   . History of endometrial cancer s/p TAH BSO June2013 06/15/2011    Grade 1 endometrioid adenocarcinoma with focal superficial myometrial invasion of 0.1 cm with a myometrium of 1.8 cm or approximately 7%. She had a 5-cm tumor. No lymphovascular space Involvement.    . Mass of in fold of jejunum s/p SB resection 07/19/2012 07/20/2012  . UTI (urinary tract infection)   . SBO (small bowel obstruction) -  recurrent 04/27/2012    Past Surgical History  Procedure Laterality Date  . Abdominal hysterectomy  11/02/10    TAH, BSO, lysis of adhesions for endometrial cancer  . Incisional hernia repair  11/02/10  . Back surgery  1989, 1999, 2009    anterior L2-3, L3-4 arthrodesis  . Kidney surgery  2010    Left kidney  . Bladder suspension  2003  . Foot surgery   06/2010    left foot surgery  . Joint replacement  09/2009    Right total knee  . Total hip arthroplasty  01/2010    Right total hip  . Cataract extraction, bilateral  2003  . Total knee arthroplasty      right  . Ureterolysis  2010    lap/open left ureterolysis & omental flap, ureter stent  . Ventral hernia repair N/A 07/19/2012    Procedure: LAPAROSCOPIC LYSIS OF ADHESIONS, SMALL BOWEL RESECTION, SEROSAL REPAIR, PRIMARY VENTRAL HERNIA REPAIR;  Surgeon: Adin Hector, MD;  Location: [facility];  Service: General;  Laterality: N/A;  . Hernia repair  07/19/12    lap Callaway repair  . Eye surgery    . Back surgery  12/17/2012    Spinal fusion and repair   . Ventral hernia repair N/A 04/23/2013    Procedure: LAPAROSCOPIC exploration and repair of hernia in abdominal VENTRAL wall  HERNIA;  Surgeon: Adin Hector, MD;  Location: [facility];  Service: General;  Laterality: N/A;  . Insertion of mesh N/A 04/23/2013    Procedure: INSERTION OF MESH;  Surgeon: Adin Hector, MD;  Location: [facility];  Service: General;  Laterality: N/A;  . Laparoscopic lysis of adhesions N/A 04/23/2013    Procedure: LAPAROSCOPIC LYSIS OF ADHESIONS;  Surgeon: Adin Hector, MD;  Location: [facility];  Service: General;  Laterality: N/A;    History   Social History  . Marital Status: Widowed    Spouse Name: N/A    Number of Children: N/A  . Years of Education: N/A   Occupational History  . Not on file.   Social History Main Topics  . Smoking status: Never Smoker   . Smokeless tobacco: Not on file  . Alcohol Use: No  . Drug Use: No  . Sexual Activity: No   Other Topics Concern  . Not on file   Social History Narrative  . No narrative on file    Family History  Problem Relation Age of Onset  . Diabetes Mother   . Cancer Mother     lung  . Diabetes Father   . Cancer Father     liver  . Colon cancer Sister   . Diabetes Sister   . Cancer Sister     colon    Current Outpatient Prescriptions  Medication  Sig Dispense Refill  . amitriptyline (ELAVIL) 50 MG tablet Take 50 mg by mouth at bedtime.      . ciprofloxacin (CIPRO) 500 MG tablet Take 1 tablet (500 mg total) by mouth 2 (two) times daily.  20 tablet  0  . docusate sodium (COLACE) 100 MG capsule Take 100 mg by mouth at bedtime.      . fish oil-omega-3 fatty acids 1000 MG capsule Take 1 g by mouth daily.      Marland Kitchen gabapentin (NEURONTIN) 300 MG capsule Take 1 capsule (300 mg total) by mouth at bedtime.  90 capsule  3  . HYDROcodone-acetaminophen (NORCO) 10-325 MG per tablet Take 1-2 tablets by mouth every 6 (six) hours  as needed.  50 tablet  0  . lisinopril-hydrochlorothiazide (PRINZIDE,ZESTORETIC) 20-12.5 MG per tablet Take 1 tablet by mouth every morning.  90 tablet  3  . meloxicam (MOBIC) 15 MG tablet Take 1 tablet (15 mg total) by mouth every morning.  90 tablet  3  . simvastatin (ZOCOR) 20 MG tablet Take 1 tablet (20 mg total) by mouth at bedtime.  90 tablet  3   No current facility-administered medications for this visit.     Allergies  Allergen Reactions  . Cashew Nut Oil Hives  . Sulfamethoxazole     Mouth sores  . Zofran Hives and Nausea Only    BP 130/78  Pulse 100  Temp(Src) 98.3 F (36.8 C) (Temporal)  Resp 15  Ht 5' (1.524 m)  Wt 191 lb 12.8 oz (87 kg)  BMI 37.46 kg/m2  Mm Digital Diagnostic Unilat L  05/14/2013   CLINICAL DATA:  Followup left breast nodule.  EXAM: DIGITAL DIAGNOSTIC  LEFT MAMMOGRAM WITH CAD  COMPARISON:  Multiple priors  ACR Breast Density Category b: There are scattered areas of fibroglandular density.  FINDINGS: The previously described left breast nodule is unchanged in appearance. No suspicious microcalcifications or architectural distortion.  Mammographic images were processed with CAD.  IMPRESSION: No change in left breast nodule for 6 months.  RECOMMENDATION: Bilateral diagnostic mammogram in May 2015.  I have discussed the findings and recommendations with the patient. Results were also provided in  writing at the conclusion of the visit. If applicable, a reminder letter will be sent to the patient regarding the next appointment.  BI-RADS CATEGORY  3: Probably benign finding(s) - short interval follow-up suggested.   Electronically Signed   By: Donavan Burnet M.D.   On: 05/14/2013 09:17     Review of Systems  Constitutional: Negative for fever, chills and diaphoresis.  HENT: Negative for ear pain, sore throat and trouble swallowing.   Eyes: Negative for photophobia and visual disturbance.  Respiratory: Negative for cough and choking.   Cardiovascular: Negative for chest pain and palpitations.  Gastrointestinal: Negative for nausea, vomiting, abdominal pain, diarrhea, constipation, anal bleeding and rectal pain.  Genitourinary: Negative for dysuria, frequency and difficulty urinating.  Musculoskeletal: Negative for gait problem and myalgias.  Skin: Negative for color change, pallor and rash.  Neurological: Negative for dizziness, speech difficulty, weakness and numbness.  Hematological: Negative for adenopathy.  Psychiatric/Behavioral: Negative for confusion and agitation. The patient is not nervous/anxious.        Objective:   Physical Exam  Constitutional: She is oriented to person, place, and time. She appears well-developed and well-nourished. No distress.  HENT:  Head: Normocephalic.  Mouth/Throat: Oropharynx is clear and moist. No oropharyngeal exudate.  Eyes: Conjunctivae and EOM are normal. Pupils are equal, round, and reactive to light. No scleral icterus.  Neck: Normal range of motion. No tracheal deviation present.  Cardiovascular: Normal rate and intact distal pulses.   Pulmonary/Chest: Effort normal. No respiratory distress. She exhibits no tenderness.  Abdominal: Soft. She exhibits no distension. There is no tenderness. Hernia confirmed negative in the right inguinal area and confirmed negative in the left inguinal area.  Incisions clean with normal healing  ridges.  No hernias  Genitourinary: No vaginal discharge found.  Musculoskeletal: Normal range of motion. She exhibits no tenderness.  Lymphadenopathy:       Right: No inguinal adenopathy present.       Left: No inguinal adenopathy present.  Neurological: She is alert and oriented to person, place,  and time. No cranial nerve deficit. She exhibits normal muscle tone. Coordination normal.  Skin: Skin is warm and dry. No rash noted. She is not diaphoretic.  Psychiatric: She has a normal mood and affect. Her behavior is normal.       Assessment:     Recovering well only 3 weeks out from large ventral repair with mesh.  Recurrent urinary symptoms of uncertain etiology ?UTI     Plan:     Think that she is doing rather well.  She and her daughter agreed.  Glad she is recovered smoothly so far aside from the urinary symptoms  Increase activity as tolerated to regular activity.  Low impact exercise such as walking an hour a day at least ideal.  Do not push through pain.  Increase Aleve for better muscular skeletal pain control.  I reviewed hydrocodone one more time to help her out.,  If she does not improve.  Followup with urology for urinary symptoms.  She has an appointment and he see Dr. Alinda Money later today  Diet as tolerated.  Low fat high fiber diet ideal.  Bowel regimen with 30 g fiber a day and fiber supplement as needed to avoid problems.  Return to clinic as needed.   Instructions discussed.  Followup with primary care physician for other health issues as would normally be done.  Questions answered.  The patient expressed understanding and appreciation

## 2013-05-14 NOTE — Patient Instructions (Signed)
Managing Pain  Pain after surgery or related to activity is often due to strain/injury to muscle, tendon, nerves and/or incisions.  This pain is usually short-term and will improve in a few months.   Many people find it helpful to do the following things TOGETHER to help speed the process of healing and to get back to regular activity more quickly:  1. Avoid heavy physical activity a.  no lifting greater than 20 pounds b. Do not "push through" the pain.  Listen to your body and avoid positions and maneuvers than reproduce the pain c. Walking is okay as tolerated, but go slowly and stop when getting sore.  d. Remember: If it hurts to do it, then don't do it! 2. Take Anti-inflammatory medication  a. Take with food/snack around the clock for 1-2 weeks i. This helps the muscle and nerve tissues become less irritable and calm down faster b. Choose ONE of the following over-the-counter medications: i. Naproxen 220mg  tabs (ex. Aleve) 1-2 pills twice a day  ii. Ibuprofen 200mg  tabs (ex. Advil, Motrin) 3-4 pills with every meal and just before bedtime iii. Acetaminophen 500mg  tabs (Tylenol) 1-2 pills with every meal and just before bedtime 3. Use a Heating pad or Ice/Cold Pack a. 4-6 times a day b. May use warm bath/hottub  or showers 4. Try Gentle Massage and/or Stretching  a. at the area of pain many times a day b. stop if you feel pain - do not overdo it  Try these steps together to help you body heal faster and avoid making things get worse.  Doing just one of these things may not be enough.    If you are not getting better after two weeks or are noticing you are getting worse, contact our office for further advice; we may need to re-evaluate you & see what other things we can do to help.  HERNIA REPAIR: POST OP INSTRUCTIONS  1. DIET: Follow a light bland diet the first 24 hours after arrival home, such as soup, liquids, crackers, etc.  Be sure to include lots of fluids daily.  Avoid fast  food or heavy meals as your are more likely to get nauseated.  Eat a low fat the next few days after surgery. 2. Take your usually prescribed home medications unless otherwise directed. 3. PAIN CONTROL: a. Pain is best controlled by a usual combination of three different methods TOGETHER: i. Ice/Heat ii. Over the counter pain medication iii. Prescription pain medication b. Most patients will experience some swelling and bruising around the hernia(s) such as the bellybutton, groins, or old incisions.  Ice packs or heating pads (30-60 minutes up to 6 times a day) will help. Use ice for the first few days to help decrease swelling and bruising, then switch to heat to help relax tight/sore spots and speed recovery.  Some people prefer to use ice alone, heat alone, alternating between ice & heat.  Experiment to what works for you.  Swelling and bruising can take several weeks to resolve.   c. It is helpful to take an over-the-counter pain medication regularly for the first few weeks.  Choose one of the following that works best for you: i. Naproxen (Aleve, etc)  Two 220mg  tabs twice a day ii. Ibuprofen (Advil, etc) Three 200mg  tabs four times a day (every meal & bedtime) iii. Acetaminophen (Tylenol, etc) 325-650mg  four times a day (every meal & bedtime) d. A  prescription for pain medication should be given to you upon discharge.  Take your pain medication as prescribed.  i. If you are having problems/concerns with the prescription medicine (does not control pain, nausea, vomiting, rash, itching, etc), please call us [phone] to see if we need to switch you to a different pain medicine that will work better for you and/or control your side effect better. ii. If you need a refill on your pain medication, please contact your pharmacy.  They will contact our office to request authorization. Prescriptions will not be filled after 5 pm or on week-ends. 4. Avoid getting constipated.  Between the surgery  and the pain medications, it is common to experience some constipation.  Increasing fluid intake and taking a fiber supplement (such as Metamucil, Citrucel, FiberCon, MiraLax, etc) 1-2 times a day regularly will usually help prevent this problem from occurring.  A mild laxative (prune juice, Milk of Magnesia, MiraLax, etc) should be taken according to package directions if there are no bowel movements after 48 hours.   5. Wash / shower every day.  You may shower over the dressings as they are waterproof.   6. Remove your waterproof bandages 5 days after surgery.  You may leave the incision open to air.  You may replace a dressing/Band-Aid to cover the incision for comfort if you wish.  Continue to shower over incision(s) after the dressing is off.    7. ACTIVITIES as tolerated:   a. You may resume regular (light) daily activities beginning the next day-such as daily self-care, walking, climbing stairs-gradually increasing activities as tolerated.  If you can walk 30 minutes without difficulty, it is safe to try more intense activity such as jogging, treadmill, bicycling, low-impact aerobics, swimming, etc. b. Save the most intensive and strenuous activity for last such as sit-ups, heavy lifting, contact sports, etc  Refrain from any heavy lifting or straining until you are off narcotics for pain control.   c. DO NOT PUSH THROUGH PAIN.  Let pain be your guide: If it hurts to do something, don't do it.  Pain is your body warning you to avoid that activity for another week until the pain goes down. d. You may drive when you are no longer taking prescription pain medication, you can comfortably wear a seatbelt, and you can safely maneuver your car and apply brakes. e. Dennis Bast may have sexual intercourse when it is comfortable.  8. FOLLOW UP in our office a. Please call [facility] at [phone] to set up an appointment to see your surgeon in the office for a follow-up appointment approximately 2-3 weeks after your  surgery. b. Make sure that you call for this appointment the day you arrive home to insure a convenient appointment time. 9.  IF YOU HAVE DISABILITY OR FAMILY LEAVE FORMS, BRING THEM TO THE OFFICE FOR PROCESSING.  DO NOT GIVE THEM TO YOUR DOCTOR.  WHEN TO CALL us [phone]: 1. Poor pain control 2. Reactions / problems with new medications (rash/itching, nausea, etc)  3. Fever over 101.5 F (38.5 C) 4. Inability to urinate 5. Nausea and/or vomiting 6. Worsening swelling or bruising 7. Continued bleeding from incision. 8. Increased pain, redness, or drainage from the incision   The clinic staff is available to answer your questions during regular business hours (8:30am-5pm).  Please don't hesitate to call and ask to speak to one of our nurses for clinical concerns.   If you have a medical emergency, go to the nearest emergency room or call 911.  A surgeon from [facility] is  always on call at the hospitals in [facility], [place], [place], [place], [place]  [address] ?  [address] [address], [place], [place]   [address] MAIN: [phone] ? TOLL FREE: [phone] ? FAX: [phone] [email or web address]

## 2013-06-19 ENCOUNTER — Other Ambulatory Visit (HOSPITAL_COMMUNITY): Payer: Self-pay | Admitting: Urology

## 2013-06-19 DIAGNOSIS — N135 Crossing vessel and stricture of ureter without hydronephrosis: Secondary | ICD-10-CM

## 2013-06-25 ENCOUNTER — Ambulatory Visit: Payer: Medicare Other | Admitting: Family Medicine

## 2013-07-05 ENCOUNTER — Ambulatory Visit: Payer: Medicare Other | Admitting: Family Medicine

## 2013-07-10 ENCOUNTER — Ambulatory Visit (INDEPENDENT_AMBULATORY_CARE_PROVIDER_SITE_OTHER): Payer: Medicare Other

## 2013-07-10 ENCOUNTER — Encounter: Payer: Self-pay | Admitting: Family Medicine

## 2013-07-10 ENCOUNTER — Ambulatory Visit (INDEPENDENT_AMBULATORY_CARE_PROVIDER_SITE_OTHER): Payer: Medicare Other | Admitting: Family Medicine

## 2013-07-10 VITALS — BP 149/80 | HR 86 | Temp 98.2°F | Wt 197.6 lb

## 2013-07-10 DIAGNOSIS — M25569 Pain in unspecified knee: Secondary | ICD-10-CM

## 2013-07-10 DIAGNOSIS — N390 Urinary tract infection, site not specified: Secondary | ICD-10-CM

## 2013-07-10 DIAGNOSIS — M25559 Pain in unspecified hip: Secondary | ICD-10-CM

## 2013-07-10 DIAGNOSIS — R350 Frequency of micturition: Secondary | ICD-10-CM

## 2013-07-10 DIAGNOSIS — L719 Rosacea, unspecified: Secondary | ICD-10-CM

## 2013-07-10 LAB — POCT URINALYSIS DIPSTICK
Bilirubin, UA: NEGATIVE
Blood, UA: NEGATIVE
Glucose, UA: NEGATIVE
Ketones, UA: NEGATIVE
Nitrite, UA: NEGATIVE
Protein, UA: NEGATIVE
Spec Grav, UA: 1.015
Urobilinogen, UA: NEGATIVE
pH, UA: 6

## 2013-07-10 LAB — POCT UA - MICROSCOPIC ONLY
Bacteria, U Microscopic: NEGATIVE
Casts, Ur, LPF, POC: NEGATIVE
Crystals, Ur, HPF, POC: NEGATIVE
Mucus, UA: NEGATIVE
RBC, urine, microscopic: NEGATIVE
Yeast, UA: NEGATIVE

## 2013-07-10 MED ORDER — NITROFURANTOIN MONOHYD MACRO 100 MG PO CAPS: 100.0000 mg | ORAL_CAPSULE | Freq: Two times a day (BID) | ORAL | Status: DC

## 2013-07-10 MED ORDER — METRONIDAZOLE 1 % EX GEL: Freq: Every day | CUTANEOUS | Status: DC

## 2013-07-10 NOTE — Progress Notes (Signed)
   Subjective:    Patient ID: [name], female    DOB: 02-05-1946, 68 y.o.   MRN: [id number]  HPI This 68 y.o. female presents for evaluation of left hip check and follow up on UTI She has chronic hip pain due to her left leg being smaller in height than the right and She is wearing some shoes with taller soles but she still gets intermittant pain.  She had An injection on the right hip by her pain doctor and it didn't help and she wants an xray. She was on cipro in December for UTI and the cx showed resistance and she was changed To macrobid and she has been taking macrobid 100mg  po qd for suppression of uti.  She is having UTI sx's.  She has a rash on her face     Review of Systems C/o urinary frequency and rash No chest pain, SOB, HA, dizziness, vision change, N/V, diarrhea, constipation,  myalgias, arthralgias or rash.     Objective:   Physical Exam  Vital signs noted  Well developed well nourished female.  HEENT - Head atraumatic Normocephalic                Eyes - PERRLA, Conjuctiva - clear Sclera- Clear EOMI                Ears - EAC's Wnl TM's Wnl Gross Hearing WNL                Nose - Nares patent                 Throat - oropharanx wnl Respiratory - Lungs CTA bilateral Cardiac - RRR S1 and S2 without murmur GI - Abdomen soft Nontender and bowel sounds active x 4 MS - TTP left hip at the greater trochanter.  Left leg smaller than right Results for orders placed in visit on 07/10/13  POCT URINALYSIS DIPSTICK      Result Value Ref Range   Color, UA YELLOW     Clarity, UA CLEAR     Glucose, UA NEG     Bilirubin, UA NEG     Ketones, UA NEG     Spec Grav, UA 1.015     Blood, UA NEG     pH, UA 6.0     Protein, UA NEG     Urobilinogen, UA negative     Nitrite, UA NEG     Leukocytes, UA Trace    POCT UA - MICROSCOPIC ONLY      Result Value Ref Range   WBC, Ur, HPF, POC 5-10     RBC, urine, microscopic NEG     Bacteria, U Microscopic NEG     Mucus, UA  NEG     Epithelial cells, urine per micros OCC     Crystals, Ur, HPF, POC NEG     Casts, Ur, LPF, POC NEG     Yeast, UA NEG         Assessment & Plan:  Urinary frequency - Plan: POCT urinalysis dipstick, POCT UA - Microscopic Only, DG Hip Complete Left, Urine culture, nitrofurantoin, macrocrystal-monohydrate, (MACROBID) 100 MG capsule  UTI (urinary tract infection) - Plan: DG Hip Complete Left, Urine culture, nitrofurantoin, macrocrystal-monohydrate, (MACROBID) 100 MG capsule  Rosacea - Plan: metroNIDAZOLE (METROGEL) 1 % gel  Lysbeth Penner FNP

## 2013-07-12 LAB — URINE CULTURE: Organism ID, Bacteria: NO GROWTH

## 2013-07-26 ENCOUNTER — Ambulatory Visit (HOSPITAL_COMMUNITY)
Admission: RE | Admit: 2013-07-26 | Discharge: 2013-07-26 | Disposition: A | Discharge status: Home / Self Care | Payer: Medicare Other | Source: Ambulatory Visit | Attending: Urology | Admitting: Urology

## 2013-07-26 DIAGNOSIS — N135 Crossing vessel and stricture of ureter without hydronephrosis: Secondary | ICD-10-CM | POA: Insufficient documentation

## 2013-07-26 MED ORDER — FUROSEMIDE 10 MG/ML IJ SOLN
60.0000 mg | Freq: Once | INTRAMUSCULAR | Status: AC
  Administered 2013-07-26: 44 mg via INTRAVENOUS
  Filled 2013-07-26: qty 6

## 2013-07-26 MED ORDER — TECHNETIUM TC 99M MERTIATIDE
14.5000 | Freq: Once | INTRAVENOUS | Status: AC | PRN
  Administered 2013-07-26: 15 via INTRAVENOUS

## 2013-08-08 ENCOUNTER — Ambulatory Visit (INDEPENDENT_AMBULATORY_CARE_PROVIDER_SITE_OTHER): Payer: Medicare Other | Admitting: Family Medicine

## 2013-08-08 ENCOUNTER — Encounter: Payer: Self-pay | Admitting: Family Medicine

## 2013-08-08 VITALS — BP 120/61 | HR 69 | Temp 97.4°F | Ht 60.0 in | Wt 195.8 lb

## 2013-08-08 DIAGNOSIS — G4700 Insomnia, unspecified: Secondary | ICD-10-CM

## 2013-08-08 DIAGNOSIS — R739 Hyperglycemia, unspecified: Secondary | ICD-10-CM

## 2013-08-08 DIAGNOSIS — E669 Obesity, unspecified: Secondary | ICD-10-CM

## 2013-08-08 DIAGNOSIS — Z8542 Personal history of malignant neoplasm of other parts of uterus: Secondary | ICD-10-CM

## 2013-08-08 DIAGNOSIS — IMO0002 Reserved for concepts with insufficient information to code with codable children: Secondary | ICD-10-CM

## 2013-08-08 DIAGNOSIS — K635 Polyp of colon: Secondary | ICD-10-CM

## 2013-08-08 DIAGNOSIS — E785 Hyperlipidemia, unspecified: Secondary | ICD-10-CM

## 2013-08-08 DIAGNOSIS — S32009K Unspecified fracture of unspecified lumbar vertebra, subsequent encounter for fracture with nonunion: Secondary | ICD-10-CM

## 2013-08-08 DIAGNOSIS — I10 Essential (primary) hypertension: Secondary | ICD-10-CM

## 2013-08-08 DIAGNOSIS — L719 Rosacea, unspecified: Secondary | ICD-10-CM

## 2013-08-08 DIAGNOSIS — D126 Benign neoplasm of colon, unspecified: Secondary | ICD-10-CM

## 2013-08-08 DIAGNOSIS — R7309 Other abnormal glucose: Secondary | ICD-10-CM

## 2013-08-08 DIAGNOSIS — K432 Incisional hernia without obstruction or gangrene: Secondary | ICD-10-CM

## 2013-08-08 DIAGNOSIS — E559 Vitamin D deficiency, unspecified: Secondary | ICD-10-CM

## 2013-08-08 DIAGNOSIS — G8929 Other chronic pain: Secondary | ICD-10-CM

## 2013-08-08 LAB — POCT GLYCOSYLATED HEMOGLOBIN (HGB A1C): Hemoglobin A1C: 5.6

## 2013-08-08 MED ORDER — LISINOPRIL-HYDROCHLOROTHIAZIDE 20-12.5 MG PO TABS: 1.0000 | ORAL_TABLET | Freq: Every morning | ORAL | Status: DC

## 2013-08-08 MED ORDER — GABAPENTIN 300 MG PO CAPS: 300.0000 mg | ORAL_CAPSULE | Freq: Every day | ORAL | Status: DC

## 2013-08-08 MED ORDER — SIMVASTATIN 20 MG PO TABS: 20.0000 mg | ORAL_TABLET | Freq: Every day | ORAL | Status: DC

## 2013-08-08 MED ORDER — AMITRIPTYLINE HCL 50 MG PO TABS: 50.0000 mg | ORAL_TABLET | Freq: Every day | ORAL | Status: DC

## 2013-08-08 NOTE — Patient Instructions (Signed)
      Dr Jowanna Loeffler's Recommendations  For nutrition information, I recommend books:  1).Eat to Live by Dr Joel Fuhrman. 2).Prevent and Reverse Heart Disease by Dr Caldwell Esselstyn. 3) Dr Neal Barnard's Book:  Program to Reverse Diabetes  Exercise recommendations are:  If unable to walk, then the patient can exercise in a chair 3 times a day. By flapping arms like a bird gently and raising legs outwards to the front.  If ambulatory, the patient can go for walks for 30 minutes 3 times a week. Then increase the intensity and duration as tolerated.  Goal is to try to attain exercise frequency to 5 times a week.  If applicable: Best to perform resistance exercises (machines or weights) 2 days a week and cardio type exercises 3 days per week.  

## 2013-08-08 NOTE — Progress Notes (Signed)
Patient ID: [name], female   DOB: October 30, 1945, 68 y.o.   MRN: [id number] SUBJECTIVE: CC: Chief Complaint  Patient presents with  . Hyperlipidemia    4 month recheck  . Hypertension    HPI: Patient is here for follow up of hyperlipidemia/HTN denies Headache;denies Chest Pain;denies weakness;denies Shortness of Breath and orthopnea;denies Visual changes;denies palpitations;denies cough;denies pedal edema;denies symptoms of TIA or stroke;deniesClaudication symptoms. admits to Compliance with medications; denies Problems with medications.   Past Medical History  Diagnosis Date  . Endometrioid adenocarcinoma   . Hypertension   . Obesity (BMI 30-39.9) 04/27/2012  . Arthritis   . Hyperlipidemia   . Ureteral adhesion, left 2010    s/p ureterolysis & stenting Dr. Larwance Sachs now  . 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 Colon polyp, hyperplastic 04/27/2012    Colonoscopy 2010.  No adenomatous polyps   . History of endometrial cancer s/p TAH BSO June2013 06/15/2011    Grade 1 endometrioid adenocarcinoma with focal superficial myometrial invasion of 0.1 cm with a myometrium of 1.8 cm or approximately 7%. She had a 5-cm tumor. No lymphovascular space Involvement.    . Mass of in fold of jejunum s/p SB resection 07/19/2012 07/20/2012  . UTI (urinary tract infection)   . SBO (small bowel obstruction) - recurrent 04/27/2012   Past Surgical History  Procedure Laterality Date  . Abdominal hysterectomy  11/02/10    TAH, BSO, lysis of adhesions for endometrial cancer  . Incisional hernia repair  11/02/10  . Back surgery  1989, 1999, 2009    anterior L2-3, L3-4 arthrodesis  . Kidney surgery  2010    Left kidney  . Bladder suspension  2003  . Foot surgery  06/2010    left foot surgery  . Joint replacement  09/2009    Right total knee  . Total hip arthroplasty  01/2010    Right total hip  . Cataract extraction, bilateral  2003  .  Total knee arthroplasty      right  . Ureterolysis  2010    lap/open left ureterolysis & omental flap, ureter stent  . Ventral hernia repair N/A 07/19/2012    Procedure: LAPAROSCOPIC LYSIS OF ADHESIONS, SMALL BOWEL RESECTION, SEROSAL REPAIR, PRIMARY VENTRAL HERNIA REPAIR;  Surgeon: Adin Hector, MD;  Location: [facility];  Service: General;  Laterality: N/A;  . Hernia repair  07/19/12    lap [place] repair  . Eye surgery    . Back surgery  12/17/2012    Spinal fusion and repair   . Ventral hernia repair N/A 04/23/2013    Procedure: LAPAROSCOPIC exploration and repair of hernia in abdominal VENTRAL wall  HERNIA;  Surgeon: Adin Hector, MD;  Location: [facility];  Service: General;  Laterality: N/A;  . Insertion of mesh N/A 04/23/2013    Procedure: INSERTION OF MESH;  Surgeon: Adin Hector, MD;  Location: [facility];  Service: General;  Laterality: N/A;  . Laparoscopic lysis of adhesions N/A 04/23/2013    Procedure: LAPAROSCOPIC LYSIS OF ADHESIONS;  Surgeon: Adin Hector, MD;  Location: [facility];  Service: General;  Laterality: N/A;   History   Social History  . Marital Status: Widowed    Spouse Name: N/A    Number of Children: N/A  . Years of Education: N/A   Occupational History  . Not on file.   Social History Main Topics  . Smoking status: Never Smoker   . Smokeless tobacco: Not on  file  . Alcohol Use: No  . Drug Use: No  . Sexual Activity: No   Other Topics Concern  . Not on file   Social History Narrative  . No narrative on file   Family History  Problem Relation Age of Onset  . Diabetes Mother   . Cancer Mother     lung  . Diabetes Father   . Cancer Father     liver  . Colon cancer Sister   . Diabetes Sister   . Cancer Sister     colon   Current Outpatient Prescriptions on File Prior to Visit  Medication Sig Dispense Refill  . docusate sodium (COLACE) 100 MG capsule Take 100 mg by mouth at bedtime.      . fish oil-omega-3 fatty acids 1000 MG capsule Take 1 g by  mouth daily.      Marland Kitchen HYDROcodone-acetaminophen (NORCO) 10-325 MG per tablet Take 1-2 tablets by mouth every 6 (six) hours as needed.  30 tablet  0  . metroNIDAZOLE (METROGEL) 1 % gel Apply topically daily.  45 g  0   No current facility-administered medications on file prior to visit.   Allergies  Allergen Reactions  . Cashew Nut Oil Hives  . Sulfamethoxazole     Mouth sores  . Zofran Hives and Nausea Only   Immunization History  Administered Date(s) Administered  . Influenza,inj,Quad PF,36+ Mos 02/21/2013  . Pneumococcal Polysaccharide-23 05/09/2010  . Tdap 03/09/2012  . Zoster 05/09/2010   Prior to Admission medications   Medication Sig Start Date End Date Taking? Authorizing Provider  amitriptyline (ELAVIL) 50 MG tablet Take 50 mg by mouth at bedtime. 02/21/13  Yes Vernie Shanks, MD  docusate sodium (COLACE) 100 MG capsule Take 100 mg by mouth at bedtime.   Yes Historical Provider, MD  fish oil-omega-3 fatty acids 1000 MG capsule Take 1 g by mouth daily.   Yes Historical Provider, MD  gabapentin (NEURONTIN) 300 MG capsule Take 1 capsule (300 mg total) by mouth at bedtime. 08/10/12  Yes Vernie Shanks, MD  HYDROcodone-acetaminophen (NORCO) 10-325 MG per tablet Take 1-2 tablets by mouth every 6 (six) hours as needed. 05/14/13 05/14/14 Yes Adin Hector, MD  lisinopril-hydrochlorothiazide (PRINZIDE,ZESTORETIC) 20-12.5 MG per tablet Take 1 tablet by mouth every morning. 08/10/12  Yes Vernie Shanks, MD  meloxicam (MOBIC) 15 MG tablet Take 1 tablet (15 mg total) by mouth every morning. 08/10/12  Yes Vernie Shanks, MD  metroNIDAZOLE (METROGEL) 1 % gel Apply topically daily. 07/10/13  Yes Lysbeth Penner, FNP  simvastatin (ZOCOR) 20 MG tablet Take 1 tablet (20 mg total) by mouth at bedtime. 08/15/12  Yes Vernie Shanks, MD     ROS: As above in the HPI. All other systems are stable or negative.  OBJECTIVE: APPEARANCE:  Patient in no acute distress.The patient appeared well nourished and  normally developed. Acyanotic. Waist: VITAL SIGNS:BP 120/61  Pulse 69  Temp(Src) 97.4 F (36.3 C) (Oral)  Ht 5' (1.524 m)  Wt 195 lb 12.8 oz (88.814 kg)  BMI 38.24 kg/m2 WF Obese  SKIN: warm and  Dry without overt rashes, tattoos and scars  HEAD and Neck: without JVD, Head and scalp: normal Eyes:No scleral icterus. Fundi normal, eye movements normal. Ears: Auricle normal, canal normal, Tympanic membranes normal, insufflation normal. Nose: normal Throat: normal Neck & thyroid: normal  CHEST & LUNGS: Chest wall: normal Lungs: Clear  CVS: Reveals the PMI to be normally located. Regular rhythm, First and  Second Heart sounds are normal,  absence of murmurs, rubs or gallops. Peripheral vasculature: Radial pulses: normal Dorsal pedis pulses: normal Posterior pulses: normal  ABDOMEN:  Appearance: Obese Benign, no organomegaly, no masses, no Abdominal Aortic enlargement. No Guarding , no rebound. No Bruits. Bowel sounds: normal  RECTAL: N/A GU: N/A  EXTREMETIES: nonedematous.  MUSCULOSKELETAL:  Spine: normal Joints: intact  NEUROLOGIC: oriented to time,place and person; nonfocal. Strength is normal Sensory is normal Reflexes are normal Cranial Nerves are normal.  Results for orders placed in visit on 08/08/13  POCT GLYCOSYLATED HEMOGLOBIN (HGB A1C)      Result Value Ref Range   Hemoglobin A1C 5.6      ASSESSMENT: Obesity (BMI 30-39.9)  Hypertension - Plan: lisinopril-hydrochlorothiazide (PRINZIDE,ZESTORETIC) 20-12.5 MG per tablet, CMP14+EGFR  History of endometrial cancer s/p TAH BSO June2013  HLD (hyperlipidemia) - Plan: Lipid panel  Recurrent ventral incisional hernia s/p lap repair 04/23/2013  Colon polyp, hyperplastic  Chronic pain - Plan: gabapentin (NEURONTIN) 300 MG capsule, amitriptyline (ELAVIL) 50 MG tablet  Pseudoarthrosis L3- L5  Other and unspecified hyperlipidemia - Plan: simvastatin (ZOCOR) 20 MG tablet  Rosacea  Insomnia -  Plan: amitriptyline (ELAVIL) 50 MG tablet  Vitamin D deficiency - Plan: Vit D  25 hydroxy (rtn osteoporosis monitoring)  Hyperglycemia - Plan: POCT glycosylated hemoglobin (Hb A1C)  PLAN:      Dr Paula Libra Recommendations  For nutrition information, I recommend books:  1).Eat to Live by Dr Excell Seltzer. 2).Prevent and Reverse Heart Disease by Dr Karl Luke. 3) Dr Janene Harvey Book:  Program to Reverse Diabetes  Exercise recommendations are:  If unable to walk, then the patient can exercise in a chair 3 times a day. By flapping arms like a bird gently and raising legs outwards to the front.  If ambulatory, the patient can go for walks for 30 minutes 3 times a week. Then increase the intensity and duration as tolerated.  Goal is to try to attain exercise frequency to 5 times a week.  If applicable: Best to perform resistance exercises (machines or weights) 2 days a week and cardio type exercises 3 days per week.  Orders Placed This Encounter  Procedures  . CMP14+EGFR  . Lipid panel  . Vit D  25 hydroxy (rtn osteoporosis monitoring)  . POCT glycosylated hemoglobin (Hb A1C)   Meds ordered this encounter  Medications  . gabapentin (NEURONTIN) 300 MG capsule    Sig: Take 1 capsule (300 mg total) by mouth at bedtime.    Dispense:  90 capsule    Refill:  3  . lisinopril-hydrochlorothiazide (PRINZIDE,ZESTORETIC) 20-12.5 MG per tablet    Sig: Take 1 tablet by mouth every morning.    Dispense:  90 tablet    Refill:  3  . simvastatin (ZOCOR) 20 MG tablet    Sig: Take 1 tablet (20 mg total) by mouth at bedtime.    Dispense:  90 tablet    Refill:  3  . amitriptyline (ELAVIL) 50 MG tablet    Sig: Take 1 tablet (50 mg total) by mouth at bedtime.    Dispense:  90 tablet    Refill:  3   Medications Discontinued During This Encounter  Medication Reason  . nitrofurantoin, macrocrystal-monohydrate, (MACROBID) 100 MG capsule Error  . nitrofurantoin,  macrocrystal-monohydrate, (MACROBID) 100 MG capsule Error  . meloxicam (MOBIC) 15 MG tablet Change in therapy  . gabapentin (NEURONTIN) 300 MG capsule Reorder  . lisinopril-hydrochlorothiazide (PRINZIDE,ZESTORETIC) 20-12.5 MG per tablet Reorder  .  simvastatin (ZOCOR) 20 MG tablet Reorder  . amitriptyline (ELAVIL) 50 MG tablet Reorder   Return in about 4 months (around 12/08/2013) for Recheck medical problems, with [employer].Dub Mikes P. Jacelyn Grip, M.D.

## 2013-08-09 LAB — CMP14+EGFR
ALT: 13 IU/L (ref 0–32)
AST: 16 IU/L (ref 0–40)
Albumin/Globulin Ratio: 1.8 (ref 1.1–2.5)
Albumin: 4.5 g/dL (ref 3.6–4.8)
Alkaline Phosphatase: 68 IU/L (ref 39–117)
BUN/Creatinine Ratio: 17 (ref 11–26)
BUN: 15 mg/dL (ref 8–27)
CO2: 22 mmol/L (ref 18–29)
Calcium: 9.5 mg/dL (ref 8.7–10.3)
Chloride: 101 mmol/L (ref 97–108)
Creatinine, Ser: 0.86 mg/dL (ref 0.57–1.00)
GFR calc Af Amer: 80 mL/min/{1.73_m2} (ref >59)
GFR calc non Af Amer: 70 mL/min/{1.73_m2} (ref >59)
Globulin, Total: 2.5 g/dL (ref 1.5–4.5)
Glucose: 86 mg/dL (ref 65–99)
Potassium: 5.1 mmol/L (ref 3.5–5.2)
Sodium: 143 mmol/L (ref 134–144)
Total Bilirubin: 0.5 mg/dL (ref 0.0–1.2)
Total Protein: 7 g/dL (ref 6.0–8.5)

## 2013-08-09 LAB — LIPID PANEL
Chol/HDL Ratio: 2.6 ratio units (ref 0.0–4.4)
Cholesterol, Total: 225 mg/dL — ABNORMAL HIGH (ref 100–199)
HDL: 85 mg/dL (ref >39)
LDL Calculated: 127 mg/dL — ABNORMAL HIGH (ref 0–99)
Triglycerides: 63 mg/dL (ref 0–149)
VLDL Cholesterol Cal: 13 mg/dL (ref 5–40)

## 2013-08-09 LAB — VITAMIN D 25 HYDROXY (VIT D DEFICIENCY, FRACTURES): Vit D, 25-Hydroxy: 19 ng/mL — ABNORMAL LOW (ref 30.0–100.0)

## 2013-08-11 NOTE — Progress Notes (Signed)
Quick Note:  Call Patient Labs that are abnormal: Vitamin D is too low  The rest are at or close to goal: the high HDL counterbalances the less than optimal LDLc. Usually we like to see the LDLc at 100. But the HDLc is very high. No changes.  Recommendations: Start on Vitamin D 2,000 units daily. Will need to recheck at next visit.   ______

## 2013-08-21 ENCOUNTER — Ambulatory Visit: Payer: Medicare Other | Admitting: Gynecologic Oncology

## 2013-08-28 ENCOUNTER — Encounter: Payer: Self-pay | Admitting: Gynecologic Oncology

## 2013-08-28 ENCOUNTER — Ambulatory Visit: Payer: Medicare Other | Attending: Gynecologic Oncology | Admitting: Gynecologic Oncology

## 2013-08-28 VITALS — BP 147/65 | HR 74 | Temp 97.9°F | Resp 18 | Ht 60.08 in | Wt 194.4 lb

## 2013-08-28 DIAGNOSIS — C541 Malignant neoplasm of endometrium: Secondary | ICD-10-CM | POA: Insufficient documentation

## 2013-08-28 DIAGNOSIS — C549 Malignant neoplasm of corpus uteri, unspecified: Secondary | ICD-10-CM | POA: Insufficient documentation

## 2013-08-28 NOTE — Progress Notes (Signed)
Follow Up Note: Gyn-Onc  [name] 68 y.o. female  CC:  Chief Complaint  Patient presents with  . Endo ca    Follow up    HPI:  [name] is a very pleasant 68 year old who initially presented with postmenopausal bleeding. She was seen by Dr. Evie Lacks and an endometrial biopsy was performed that revealed a grade 1 endometrioid adenocarcinoma. She was referred to GYN Oncology.  After discussion of risks and benefits, she opted for surgery and on November 02, 2010 underwent diagnostic laparoscopy conversion to TAH-BSO, lysis of adhesions for 20 minutes, and incisional hernia repair.   OPERATIVE FINDINGS: Included significant adhesive disease of the omentum and anterior abdominal wall, small bowel to the anterior abdominal wall, and a globular fibroid uterus. She had a 3-cm incisional hernia. Frozen section revealed minimal myometrial invasion. Fortunately, final pathology also revealed minimal myometrial invasion. She had a grade 1 endometrioid adenocarcinoma with focal superficial myometrial invasion of 0.1 cm with a myometrium of 1.8 cm or approximately 7%. She had a 5-cm tumor. No lymphovascular space Involvement.   We last saw her October 9. Exam at that time was negative for cancer recurrence and her Pap smear was negative  Interval History:   04/23/2012 she underwent a laparoscopic lysis of adhesions for 45 minutes and repair of a recurrent incisional hernia with mesh. She's overall doing very well. She states that from December to April she was having issues of urinary tract infections. She's been followed by Dr. Alinda Money. She is down to 20% function of her left kidney. She does have evaluation with urine flow exams every 6 months. She states that today she is "good". She had a followup mammogram in December of the left breast that was benign. She scheduled for bilateral mammograms. Her colonoscopy is not due  to the age of 61. Her primary physician is Dr. Redge Gainer. There are no new medical problems her family.  Review of Systems  Constitutional: Denies fever. Skin: No rash, sores, jaundice, itching, or dryness.  Cardiovascular: No chest pain, shortness of breath, or edema  Pulmonary: No cough or wheeze.  Gastro Intestinal:  No nausea, vomiting, constipation, or diarrhea reported. No bright red blood per rectum or change in bowel movement.  Genitourinary: No frequency, urgency, or dysuria.  Denies vaginal bleeding and discharge.  Musculoskeletal: No myalgia, arthralgia, joint swelling or pain.  Neurologic: No weakness, numbness, or change in gait.  Psychology: No changes.   Current Meds:  Outpatient Encounter Prescriptions as of 08/28/2013  Medication Sig  . amitriptyline (ELAVIL) 50 MG tablet Take 1 tablet (50 mg total) by mouth at bedtime.  . docusate sodium (COLACE) 100 MG capsule Take 100 mg by mouth at bedtime.  . fish oil-omega-3 fatty acids 1000 MG capsule Take 1 g by mouth daily.  Marland Kitchen gabapentin (NEURONTIN) 300 MG capsule Take 1 capsule (300 mg total) by mouth at bedtime.  Marland Kitchen HYDROcodone-acetaminophen (NORCO) 10-325 MG per tablet Take 1-2 tablets by mouth every 6 (six) hours as needed.  Marland Kitchen lisinopril-hydrochlorothiazide (PRINZIDE,ZESTORETIC) 20-12.5 MG per tablet Take 1 tablet by mouth every morning.  . meloxicam (MOBIC) 15 MG tablet Take 15 mg by mouth daily.  . metroNIDAZOLE (METROGEL) 1 % gel Apply topically daily.  . nitrofurantoin (MACRODANTIN) 100 MG capsule Take 100 mg by mouth at bedtime.  . simvastatin (ZOCOR) 20 MG tablet Take 1 tablet (20 mg total) by mouth at bedtime.    Allergy:  Allergies  Allergen Reactions  . Cashew Nut Oil  Hives  . Sulfamethoxazole     Mouth sores  . Zofran Hives and Nausea Only    Social Hx:   History   Social History  . Marital Status: Widowed    Spouse Name: N/A    Number of Children: N/A  . Years of Education: N/A   Occupational  History  . Not on file.   Social History Main Topics  . Smoking status: Never Smoker   . Smokeless tobacco: Not on file  . Alcohol Use: No  . Drug Use: No  . Sexual Activity: No   Other Topics Concern  . Not on file   Social History Narrative  . No narrative on file    Past Surgical Hx:  Past Surgical History  Procedure Laterality Date  . Abdominal hysterectomy  11/02/10    TAH, BSO, lysis of adhesions for endometrial cancer  . Incisional hernia repair  11/02/10  . Back surgery  1989, 1999, 2009    anterior L2-3, L3-4 arthrodesis  . Kidney surgery  2010    Left kidney  . Bladder suspension  2003  . Foot surgery  06/2010    left foot surgery  . Joint replacement  09/2009    Right total knee  . Total hip arthroplasty  01/2010    Right total hip  . Cataract extraction, bilateral  2003  . Total knee arthroplasty      right  . Ureterolysis  2010    lap/open left ureterolysis & omental flap, ureter stent  . Ventral hernia repair N/A 07/19/2012    Procedure: LAPAROSCOPIC LYSIS OF ADHESIONS, SMALL BOWEL RESECTION, SEROSAL REPAIR, PRIMARY VENTRAL HERNIA REPAIR;  Surgeon: Adin Hector, MD;  Location: [facility];  Service: General;  Laterality: N/A;  . Hernia repair  07/19/12    lap [place] repair  . Eye surgery    . Back surgery  12/17/2012    Spinal fusion and repair   . Ventral hernia repair N/A 04/23/2013    Procedure: LAPAROSCOPIC exploration and repair of hernia in abdominal VENTRAL wall  HERNIA;  Surgeon: Adin Hector, MD;  Location: [facility];  Service: General;  Laterality: N/A;  . Insertion of mesh N/A 04/23/2013    Procedure: INSERTION OF MESH;  Surgeon: Adin Hector, MD;  Location: [facility];  Service: General;  Laterality: N/A;  . Laparoscopic lysis of adhesions N/A 04/23/2013    Procedure: LAPAROSCOPIC LYSIS OF ADHESIONS;  Surgeon: Adin Hector, MD;  Location: [facility];  Service: General;  Laterality: N/A;    Past Medical Hx:  Past Medical History  Diagnosis Date  .  Endometrioid adenocarcinoma   . Hypertension   . Obesity (BMI 30-39.9) 04/27/2012  . Arthritis   . Hyperlipidemia   . Ureteral adhesion, left 2010    s/p ureterolysis & stenting Dr. Larwance Sachs now  . 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 Colon polyp, hyperplastic 04/27/2012    Colonoscopy 2010.  No adenomatous polyps   . History of endometrial cancer s/p TAH BSO June2013 06/15/2011    Grade 1 endometrioid adenocarcinoma with focal superficial myometrial invasion of 0.1 cm with a myometrium of 1.8 cm or approximately 7%. She had a 5-cm tumor. No lymphovascular space Involvement.    . Mass of in fold of jejunum s/p SB resection 07/19/2012 07/20/2012  . UTI (urinary tract infection)   . SBO (small bowel obstruction) - recurrent 04/27/2012    Family Hx:  Family History  Problem Relation Age of Onset  . Diabetes Mother   . Cancer Mother     lung  . Diabetes Father   . Cancer Father     liver  . Colon cancer Sister   . Diabetes Sister   . Cancer Sister     colon    Vitals:  Blood pressure 147/65, pulse 74, temperature 97.9 F (36.6 C), resp. rate 18, height 5' 0.08" (1.526 m), weight 194 lb 6.4 oz (88.179 kg).  Physical Exam:  General: Well developed, well nourished female in no acute distress. Alert and oriented x 3.    Lymph node survey: No cervical, supraclavicular, or inguinal adenopathy.   Cardiovascular: Regular rate and rhythm.  Lungs: Clear to auscultation bilaterally.   Skin: No rashes or lesions present.   Back: No CVA tenderness.   Abdomen: Abdomen soft, non-tender and obese.  Active bowel sounds in all quadrants. No evidence of a fluid wave or abdominal masses.  No distension, tenderness, rigidity, or guarding. No appreciable hernias.  Genitourinary:    Vulva/vagina: Normal external female genitalia. No lesions.    Urethra: No lesions or masses    Vagina: Atrophic without any lesions. No palpable  masses. No vaginal bleeding or drainage noted.  Rectal: Good tone, no masses no cul de sac nodularity.   Extremities: No bilateral cyanosis or clubbing.     Assessment/Plan:  Konya Fauble is a 68 year old with stage IA, grade 1 endometrioid adenocarcinoma, who requires no postoperative adjuvant therapy.  She has been without evidence of recurrence since 2012.   She is to follow up with [place] in 6 months.  Lugene Beougher A. Alycia Rossetti, MD 08/28/2013, 11:34 AM

## 2013-08-28 NOTE — Patient Instructions (Signed)
Return to clinic in 6 months.

## 2013-08-29 ENCOUNTER — Other Ambulatory Visit: Payer: Self-pay

## 2013-08-29 ENCOUNTER — Other Ambulatory Visit: Payer: Self-pay | Admitting: Family Medicine

## 2013-08-29 DIAGNOSIS — N63 Unspecified lump in unspecified breast: Secondary | ICD-10-CM

## 2013-09-27 ENCOUNTER — Encounter (INDEPENDENT_AMBULATORY_CARE_PROVIDER_SITE_OTHER): Payer: Self-pay

## 2013-09-27 ENCOUNTER — Ambulatory Visit
Admission: RE | Admit: 2013-09-27 | Discharge: 2013-09-27 | Disposition: A | Discharge status: Home / Self Care | Payer: Medicare Other | Source: Ambulatory Visit | Attending: Family Medicine | Admitting: Family Medicine

## 2013-09-27 DIAGNOSIS — N63 Unspecified lump in unspecified breast: Secondary | ICD-10-CM

## 2013-12-09 ENCOUNTER — Ambulatory Visit (INDEPENDENT_AMBULATORY_CARE_PROVIDER_SITE_OTHER): Payer: Medicare Other | Admitting: Family Medicine

## 2013-12-09 ENCOUNTER — Encounter: Payer: Self-pay | Admitting: Family Medicine

## 2013-12-09 VITALS — BP 108/64 | HR 77 | Temp 97.2°F | Ht 60.0 in | Wt 192.8 lb

## 2013-12-09 DIAGNOSIS — E785 Hyperlipidemia, unspecified: Secondary | ICD-10-CM

## 2013-12-09 DIAGNOSIS — I10 Essential (primary) hypertension: Secondary | ICD-10-CM

## 2013-12-09 DIAGNOSIS — E559 Vitamin D deficiency, unspecified: Secondary | ICD-10-CM

## 2013-12-09 DIAGNOSIS — R350 Frequency of micturition: Secondary | ICD-10-CM

## 2013-12-09 LAB — POCT CBC
Granulocyte percent: 66.8 %G (ref 37–80)
HCT, POC: 42.9 % (ref 37.7–47.9)
Hemoglobin: 14.3 g/dL (ref 12.2–16.2)
Lymph, poc: 2.9 (ref 0.6–3.4)
MCH, POC: 31.5 pg — AB (ref 27–31.2)
MCHC: 33.3 g/dL (ref 31.8–35.4)
MCV: 94.6 fL (ref 80–97)
MPV: 7.9 fL (ref 0–99.8)
POC Granulocyte: 6 (ref 2–6.9)
POC LYMPH PERCENT: 32.5 %L (ref 10–50)
Platelet Count, POC: 318 10*3/uL (ref 142–424)
RBC: 4.5 M/uL (ref 4.04–5.48)
RDW, POC: 12.7 %
WBC: 9 10*3/uL (ref 4.6–10.2)

## 2013-12-09 LAB — POCT URINALYSIS DIPSTICK
Bilirubin, UA: NEGATIVE
Glucose, UA: NEGATIVE
Ketones, UA: NEGATIVE
Leukocytes, UA: NEGATIVE
Nitrite, UA: NEGATIVE
Protein, UA: NEGATIVE
Spec Grav, UA: 1.01
Urobilinogen, UA: NEGATIVE
pH, UA: 6

## 2013-12-09 LAB — POCT UA - MICROSCOPIC ONLY
Casts, Ur, LPF, POC: NEGATIVE
Crystals, Ur, HPF, POC: NEGATIVE
Mucus, UA: NEGATIVE
Yeast, UA: NEGATIVE

## 2013-12-09 MED ORDER — LISINOPRIL 20 MG PO TABS: 20.0000 mg | ORAL_TABLET | Freq: Every day | ORAL | Status: DC

## 2013-12-09 NOTE — Progress Notes (Signed)
   Subjective:    Patient ID: HARJIT DOUDS, female    DOB: 07/24/1945, 68 y.o.   MRN: [id number]  HPI This 68 y.o. female presents for evaluation of routine visit.  She c/o weakness and orthostasis. She has hx of hypertension and vitamin D deficiency.   Review of Systems No chest pain, SOB, HA, dizziness, vision change, N/V, diarrhea, constipation, dysuria, urinary urgency or frequency, myalgias, arthralgias or rash.     Objective:   Physical Exam  Vital signs noted  Well developed well nourished female.  HEENT - Head atraumatic Normocephalic                Eyes - PERRLA, Conjuctiva - clear Sclera- Clear EOMI                Ears - EAC's Wnl TM's Wnl Gross Hearing WNL                Nose - Nares patent                 Throat - oropharanx wnl Respiratory - Lungs CTA bilateral Cardiac - RRR S1 and S2 without murmur GI - Abdomen soft Nontender and bowel sounds active x 4 Extremities - No edema. Neuro - Grossly intact.      Assessment & Plan:  Urinary frequency - Plan: POCT UA - Microscopic Only, POCT urinalysis dipstick  Essential hypertension, benign - Plan: POCT CBC, CMP14+EGFR, lisinopril (PRINIVIL,ZESTRIL) 20 MG tablet  Hyperlipemia - Plan: CMP14+EGFR, Lipid panel  Vitamin D def - Vitamin D level  Follow up in 3 months  [place] FNP

## 2013-12-10 LAB — CMP14+EGFR
ALT: 13 IU/L (ref 0–32)
AST: 18 IU/L (ref 0–40)
Albumin/Globulin Ratio: 2 (ref 1.1–2.5)
Albumin: 4.6 g/dL (ref 3.6–4.8)
Alkaline Phosphatase: 64 IU/L (ref 39–117)
BUN/Creatinine Ratio: 11 (ref 11–26)
BUN: 10 mg/dL (ref 8–27)
CO2: 26 mmol/L (ref 18–29)
Calcium: 9.9 mg/dL (ref 8.7–10.3)
Chloride: 96 mmol/L — ABNORMAL LOW (ref 97–108)
Creatinine, Ser: 0.92 mg/dL (ref 0.57–1.00)
GFR calc Af Amer: 73 mL/min/{1.73_m2} (ref >59)
GFR calc non Af Amer: 64 mL/min/{1.73_m2} (ref >59)
Globulin, Total: 2.3 g/dL (ref 1.5–4.5)
Glucose: 93 mg/dL (ref 65–99)
Potassium: 4.2 mmol/L (ref 3.5–5.2)
Sodium: 140 mmol/L (ref 134–144)
Total Bilirubin: 0.8 mg/dL (ref 0.0–1.2)
Total Protein: 6.9 g/dL (ref 6.0–8.5)

## 2013-12-10 LAB — LIPID PANEL
Chol/HDL Ratio: 2.8 ratio units (ref 0.0–4.4)
Cholesterol, Total: 234 mg/dL — ABNORMAL HIGH (ref 100–199)
HDL: 83 mg/dL (ref >39)
LDL Calculated: 132 mg/dL — ABNORMAL HIGH (ref 0–99)
Triglycerides: 94 mg/dL (ref 0–149)
VLDL Cholesterol Cal: 19 mg/dL (ref 5–40)

## 2013-12-10 LAB — VITAMIN D 25 HYDROXY (VIT D DEFICIENCY, FRACTURES): Vit D, 25-Hydroxy: 31.1 ng/mL (ref 30.0–100.0)

## 2013-12-12 ENCOUNTER — Telehealth: Payer: Self-pay | Admitting: Family Medicine

## 2013-12-13 NOTE — Telephone Encounter (Signed)
What recommendations do you have for recent labs?

## 2014-01-24 ENCOUNTER — Other Ambulatory Visit (HOSPITAL_COMMUNITY): Payer: Self-pay | Admitting: Urology

## 2014-01-24 DIAGNOSIS — N3020 Other chronic cystitis without hematuria: Secondary | ICD-10-CM

## 2014-02-07 ENCOUNTER — Ambulatory Visit (HOSPITAL_COMMUNITY)
Admission: RE | Admit: 2014-02-07 | Discharge: 2014-02-07 | Disposition: A | Discharge status: Home / Self Care | Payer: Medicare Other | Source: Ambulatory Visit | Attending: Urology | Admitting: Urology

## 2014-02-07 ENCOUNTER — Encounter (HOSPITAL_COMMUNITY): Payer: Self-pay

## 2014-02-07 DIAGNOSIS — N135 Crossing vessel and stricture of ureter without hydronephrosis: Secondary | ICD-10-CM | POA: Diagnosis not present

## 2014-02-07 MED ORDER — TECHNETIUM TC 99M MERTIATIDE
16.0000 | Freq: Once | INTRAVENOUS | Status: AC | PRN
  Administered 2014-02-07: 16 via INTRAVENOUS

## 2014-02-07 MED ORDER — FUROSEMIDE 10 MG/ML IJ SOLN
40.0000 mg | Freq: Once | INTRAMUSCULAR | Status: AC
  Administered 2014-02-07: 40 mg via INTRAVENOUS
  Filled 2014-02-07: qty 4

## 2014-02-27 ENCOUNTER — Other Ambulatory Visit (HOSPITAL_COMMUNITY)
Admission: RE | Admit: 2014-02-27 | Discharge: 2014-02-27 | Disposition: A | Discharge status: Home / Self Care | Payer: Medicare Other | Source: Ambulatory Visit | Attending: Gynecologic Oncology | Admitting: Gynecologic Oncology

## 2014-02-27 ENCOUNTER — Ambulatory Visit: Payer: Medicare Other | Attending: Gynecologic Oncology | Admitting: Gynecologic Oncology

## 2014-02-27 ENCOUNTER — Encounter: Payer: Self-pay | Admitting: Gynecologic Oncology

## 2014-02-27 VITALS — BP 125/55 | HR 67 | Temp 98.2°F | Resp 16 | Ht 60.0 in | Wt 195.4 lb

## 2014-02-27 DIAGNOSIS — Z6830 Body mass index (BMI) 30.0-30.9, adult: Secondary | ICD-10-CM | POA: Insufficient documentation

## 2014-02-27 DIAGNOSIS — M1990 Unspecified osteoarthritis, unspecified site: Secondary | ICD-10-CM | POA: Diagnosis not present

## 2014-02-27 DIAGNOSIS — Z90710 Acquired absence of both cervix and uterus: Secondary | ICD-10-CM | POA: Diagnosis not present

## 2014-02-27 DIAGNOSIS — I10 Essential (primary) hypertension: Secondary | ICD-10-CM | POA: Insufficient documentation

## 2014-02-27 DIAGNOSIS — Z01411 Encounter for gynecological examination (general) (routine) with abnormal findings: Secondary | ICD-10-CM | POA: Diagnosis present

## 2014-02-27 DIAGNOSIS — Z9079 Acquired absence of other genital organ(s): Secondary | ICD-10-CM | POA: Insufficient documentation

## 2014-02-27 DIAGNOSIS — Z79891 Long term (current) use of opiate analgesic: Secondary | ICD-10-CM | POA: Diagnosis not present

## 2014-02-27 DIAGNOSIS — Z90722 Acquired absence of ovaries, bilateral: Secondary | ICD-10-CM | POA: Insufficient documentation

## 2014-02-27 DIAGNOSIS — E785 Hyperlipidemia, unspecified: Secondary | ICD-10-CM | POA: Diagnosis not present

## 2014-02-27 DIAGNOSIS — Z79899 Other long term (current) drug therapy: Secondary | ICD-10-CM | POA: Diagnosis not present

## 2014-02-27 DIAGNOSIS — E669 Obesity, unspecified: Secondary | ICD-10-CM | POA: Diagnosis not present

## 2014-02-27 DIAGNOSIS — Z8542 Personal history of malignant neoplasm of other parts of uterus: Secondary | ICD-10-CM

## 2014-02-27 DIAGNOSIS — C569 Malignant neoplasm of unspecified ovary: Secondary | ICD-10-CM | POA: Diagnosis present

## 2014-02-27 DIAGNOSIS — C541 Malignant neoplasm of endometrium: Secondary | ICD-10-CM

## 2014-02-27 MED ORDER — NYSTATIN 100000 UNIT/GM EX CREA: 1.0000 "application " | TOPICAL_CREAM | Freq: Two times a day (BID) | CUTANEOUS | Status: DC

## 2014-02-27 NOTE — Patient Instructions (Signed)
We will notify you of the results of your Pap smear. Please return to see [place] in 6 months.

## 2014-02-27 NOTE — Progress Notes (Signed)
Follow Up Note: Gyn-Onc  [name] 68 y.o. female  CC:  Chief Complaint  Patient presents with  . Endo ca    Follow up    HPI:  [name] is a very pleasant 68 year old who initially presented with postmenopausal bleeding. She was seen by Dr. Evie Lacks and an endometrial biopsy was performed that revealed a grade 1 endometrioid adenocarcinoma. She was referred to GYN Oncology.  After discussion of risks and benefits, she opted for surgery and on November 02, 2010 underwent diagnostic laparoscopy conversion to TAH-BSO, lysis of adhesions for 20 minutes, and incisional hernia repair.   OPERATIVE FINDINGS: Included significant adhesive disease of the omentum and anterior abdominal wall, small bowel to the anterior abdominal wall, and a globular fibroid uterus. She had a 3-cm incisional hernia. Frozen section revealed minimal myometrial invasion. Fortunately, final pathology also revealed minimal myometrial invasion. She had a grade 1 endometrioid adenocarcinoma with focal superficial myometrial invasion of 0.1 cm with a myometrium of 1.8 cm or approximately 7%. She had a 5-cm tumor. No lymphovascular space Involvement.   We last saw her October 9. Exam at that time was negative for cancer recurrence and her Pap smear was negative  Interval History:   04/23/2012 she underwent a laparoscopic lysis of adhesions for 45 minutes and repair of a recurrent incisional hernia with mesh. She's overall doing very well. Her colonoscopy is not due to the age of 68. Her primary physician is Dr. Redge Gainer. She's overall doing quite well. She is complaining of arthritis pain all over. She stopped her meloxicam about 6 months ago secondary to worsening renal function with 40% decrease in her left kidney. She saw Dr. Alinda Money last week. Her kidney function has stabilized and he does not need to see her back for 1 year. She also  complains of some other associated with her incontinence that she has with activity. We discussed different strategies for this. She is up-to-date on her mammograms. There are no new medical problems in her family.  Review of Systems  Constitutional: Denies fever. Skin: No rash, sores, jaundice, itching, or dryness.  Cardiovascular: No chest pain, shortness of breath, or edema  Pulmonary: No cough or wheeze.  Gastro Intestinal:  No nausea, vomiting, constipation, or diarrhea reported. No bright red blood per rectum or change in bowel movement.  Genitourinary: No frequency, urgency, or dysuria. + urinary incontinence with activity. Denies vaginal bleeding and discharge.  Musculoskeletal: + diffuse arthritis pain Neurologic: No weakness, numbness, or change in gait.  Psychology: No changes.   Current Meds:  Outpatient Encounter Prescriptions as of 02/27/2014  Medication Sig  . amitriptyline (ELAVIL) 50 MG tablet Take 1 tablet (50 mg total) by mouth at bedtime.  . docusate sodium (COLACE) 100 MG capsule Take 100 mg by mouth at bedtime.  . fish oil-omega-3 fatty acids 1000 MG capsule Take 1 g by mouth daily.  Marland Kitchen gabapentin (NEURONTIN) 300 MG capsule Take 1 capsule (300 mg total) by mouth at bedtime.  Marland Kitchen HYDROcodone-acetaminophen (NORCO) 10-325 MG per tablet Take 1-2 tablets by mouth every 6 (six) hours as needed.  Marland Kitchen lisinopril (PRINIVIL,ZESTRIL) 20 MG tablet Take 1 tablet (20 mg total) by mouth daily.  . metroNIDAZOLE (METROGEL) 1 % gel Apply topically daily.  . nitrofurantoin (MACRODANTIN) 100 MG capsule Take 100 mg by mouth at bedtime.    Allergy:  Allergies  Allergen Reactions  . Cashew Nut Oil Hives  . Sulfamethoxazole     Mouth sores  . Zofran  Hives and Nausea Only    Social Hx:   History   Social History  . Marital Status: Widowed    Spouse Name: N/A    Number of Children: N/A  . Years of Education: N/A   Occupational History  . Not on file.   Social History Main  Topics  . Smoking status: Never Smoker   . Smokeless tobacco: Not on file  . Alcohol Use: No  . Drug Use: No  . Sexual Activity: No   Other Topics Concern  . Not on file   Social History Narrative  . No narrative on file    Past Surgical Hx:  Past Surgical History  Procedure Laterality Date  . Abdominal hysterectomy  11/02/10    TAH, BSO, lysis of adhesions for endometrial cancer  . Incisional hernia repair  11/02/10  . Back surgery  1989, 1999, 2009    anterior L2-3, L3-4 arthrodesis  . Kidney surgery  2010    Left kidney  . Bladder suspension  2003  . Foot surgery  06/2010    left foot surgery  . Joint replacement  09/2009    Right total knee  . Total hip arthroplasty  01/2010    Right total hip  . Cataract extraction, bilateral  2003  . Total knee arthroplasty      right  . Ureterolysis  2010    lap/open left ureterolysis & omental flap, ureter stent  . Ventral hernia repair N/A 07/19/2012    Procedure: LAPAROSCOPIC LYSIS OF ADHESIONS, SMALL BOWEL RESECTION, SEROSAL REPAIR, PRIMARY VENTRAL HERNIA REPAIR;  Surgeon: Adin Hector, MD;  Location: [facility];  Service: General;  Laterality: N/A;  . Hernia repair  07/19/12    lap [place] repair  . Eye surgery    . Back surgery  12/17/2012    Spinal fusion and repair   . Ventral hernia repair N/A 04/23/2013    Procedure: LAPAROSCOPIC exploration and repair of hernia in abdominal VENTRAL wall  HERNIA;  Surgeon: Adin Hector, MD;  Location: [facility];  Service: General;  Laterality: N/A;  . Insertion of mesh N/A 04/23/2013    Procedure: INSERTION OF MESH;  Surgeon: Adin Hector, MD;  Location: [facility];  Service: General;  Laterality: N/A;  . Laparoscopic lysis of adhesions N/A 04/23/2013    Procedure: LAPAROSCOPIC LYSIS OF ADHESIONS;  Surgeon: Adin Hector, MD;  Location: [facility];  Service: General;  Laterality: N/A;    Past Medical Hx:  Past Medical History  Diagnosis Date  . Endometrioid adenocarcinoma   . Hypertension   .  Obesity (BMI 30-39.9) 04/27/2012  . Arthritis   . Hyperlipidemia   . Ureteral adhesion, left 2010    s/p ureterolysis & stenting Dr. Larwance Sachs now  . 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 Colon polyp, hyperplastic 04/27/2012    Colonoscopy 2010.  No adenomatous polyps   . History of endometrial cancer s/p TAH BSO June2013 06/15/2011    Grade 1 endometrioid adenocarcinoma with focal superficial myometrial invasion of 0.1 cm with a myometrium of 1.8 cm or approximately 7%. She had a 5-cm tumor. No lymphovascular space Involvement.    . Mass of in fold of jejunum s/p SB resection 07/19/2012 07/20/2012  . UTI (urinary tract infection)   . SBO (small bowel obstruction) - recurrent 04/27/2012    Family Hx:  Family History  Problem Relation Age of Onset  . Diabetes Mother   . Cancer  Mother     lung  . Diabetes Father   . Cancer Father     liver  . Colon cancer Sister   . Diabetes Sister   . Cancer Sister     colon    Vitals:  Blood pressure 125/55, pulse 67, temperature 98.2 F (36.8 C), temperature source Oral, resp. rate 16, height 5' (1.524 m), weight 195 lb 6.4 oz (88.633 kg).  Physical Exam:  General: Well developed, well nourished female in no acute distress. Alert and oriented x 3.    Lymph node survey: No cervical, supraclavicular, or inguinal adenopathy.   Cardiovascular: Regular rate and rhythm.  Lungs: Clear to auscultation bilaterally.   Skin: No rashes or lesions present.   Back: No CVA tenderness.   Abdomen: Abdomen soft, non-tender and obese.  Active bowel sounds in all quadrants. No evidence of a fluid wave or abdominal masses.  No distension, tenderness, rigidity, or guarding. No appreciable hernias.  Genitourinary:    Vulva/vagina: Normal external female genitalia. No lesions.    Urethra: No lesions or masses    Vagina: Atrophic without any lesions. No palpable masses. No vaginal bleeding or  drainage noted.  Pap smear submitted without difficulty. Rectal: Good tone, no masses no cul de sac nodularity.   Extremities: No bilateral cyanosis or clubbing.     Assessment/Plan:  Grizel Vesely is a 68 year old with stage IA, grade 1 endometrioid adenocarcinoma, who requires no postoperative adjuvant therapy.  She has been without evidence of recurrence since 2012.   She is to follow up with [place] in 6 months and we will follow up on the results of her pap smear from today.  Jorge Amparo A., MD 02/27/2014, 10:22 AM

## 2014-03-05 LAB — CYTOLOGY - PAP

## 2014-03-10 ENCOUNTER — Telehealth: Payer: Self-pay | Admitting: *Deleted

## 2014-03-10 NOTE — Telephone Encounter (Signed)
-----   Message from Dorothyann Gibbs, NP sent at 03/06/2014  9:53 AM EDT ----- Please let her know that her pap smear was normal.  Thank you.   ----- Message -----    From: Lab [facility] Interface    Sent: 03/05/2014   5:24 PM      To: Dorothyann Gibbs, NP

## 2014-03-10 NOTE — Telephone Encounter (Signed)
Notified pt Pap smear normal

## 2014-03-17 ENCOUNTER — Encounter (INDEPENDENT_AMBULATORY_CARE_PROVIDER_SITE_OTHER): Payer: Self-pay

## 2014-03-17 ENCOUNTER — Encounter: Payer: Self-pay | Admitting: Family Medicine

## 2014-03-17 ENCOUNTER — Ambulatory Visit (INDEPENDENT_AMBULATORY_CARE_PROVIDER_SITE_OTHER): Payer: Medicare Other | Admitting: Family Medicine

## 2014-03-17 VITALS — BP 114/71 | HR 96 | Temp 98.6°F | Ht 60.0 in | Wt 193.4 lb

## 2014-03-17 DIAGNOSIS — J069 Acute upper respiratory infection, unspecified: Secondary | ICD-10-CM

## 2014-03-17 DIAGNOSIS — I10 Essential (primary) hypertension: Secondary | ICD-10-CM

## 2014-03-17 DIAGNOSIS — R5383 Other fatigue: Secondary | ICD-10-CM

## 2014-03-17 DIAGNOSIS — E559 Vitamin D deficiency, unspecified: Secondary | ICD-10-CM

## 2014-03-17 DIAGNOSIS — E785 Hyperlipidemia, unspecified: Secondary | ICD-10-CM

## 2014-03-17 LAB — POCT CBC
Granulocyte percent: 66.8 %G (ref 37–80)
HCT, POC: 40.5 % (ref 37.7–47.9)
Hemoglobin: 13.4 g/dL (ref 12.2–16.2)
Lymph, poc: 2.7 (ref 0.6–3.4)
MCH, POC: 30.8 pg (ref 27–31.2)
MCHC: 33 g/dL (ref 31.8–35.4)
MCV: 93.4 fL (ref 80–97)
MPV: 7.9 fL (ref 0–99.8)
POC Granulocyte: 6.2 (ref 2–6.9)
POC LYMPH PERCENT: 29.4 %L (ref 10–50)
Platelet Count, POC: 350 10*3/uL (ref 142–424)
RBC: 4.3 M/uL (ref 4.04–5.48)
RDW, POC: 13.4 %
WBC: 9.3 10*3/uL (ref 4.6–10.2)

## 2014-03-17 MED ORDER — AZITHROMYCIN 250 MG PO TABS: ORAL_TABLET | ORAL | Status: DC

## 2014-03-17 NOTE — Progress Notes (Signed)
   Subjective:    Patient ID: [name], female    DOB: [birth year], 68 y.o.   MRN: [id number]  HPI Patient is here for routine follow up.  She has hx of hypertension. She is due for med refills.  Patient is c/o uri sx's  Review of Systems  Constitutional: Negative for fever.  HENT: Negative for ear pain.   Eyes: Negative for discharge.  Respiratory: Negative for cough.   Cardiovascular: Negative for chest pain.  Gastrointestinal: Negative for abdominal distention.  Endocrine: Negative for polyuria.  Genitourinary: Negative for difficulty urinating.  Musculoskeletal: Negative for gait problem and neck pain.  Skin: Negative for color change and rash.  Neurological: Negative for speech difficulty and headaches.  Psychiatric/Behavioral: Negative for agitation.       Objective:    BP 114/71 mmHg  Pulse 96  Temp(Src) 98.6 F (37 C) (Oral)  Ht 5' (1.524 m)  Wt 193 lb 6.4 oz (87.726 kg)  BMI 37.77 kg/m2 Physical Exam  Constitutional: She is oriented to person, place, and time. She appears well-developed and well-nourished.  HENT:  Head: Normocephalic and atraumatic.  Mouth/Throat: Oropharynx is clear and moist.  Eyes: Pupils are equal, round, and reactive to light.  Neck: Normal range of motion. Neck supple.  Cardiovascular: Normal rate and regular rhythm.   No murmur heard. Pulmonary/Chest: Effort normal and breath sounds normal.  Abdominal: Soft. Bowel sounds are normal. There is no tenderness.  Neurological: She is alert and oriented to person, place, and time.  Skin: Skin is warm and dry.  Psychiatric: She has a normal mood and affect.          Assessment & Plan:     ICD-9-CM ICD-10-CM   1. Vitamin D deficiency 268.9 E55.9 Vit D  25 hydroxy (rtn osteoporosis monitoring)  2. Hyperlipemia 272.4 E78.5 CMP14+EGFR     Lipid panel  3. Essential hypertension, benign 401.1 I10 CMP14+EGFR  4. Other fatigue 780.79 R53.83 POCT CBC   URI - Zpak as directed.  No  Follow-up on file.  Lysbeth Penner FNP

## 2014-03-18 ENCOUNTER — Other Ambulatory Visit: Payer: Self-pay | Admitting: Family Medicine

## 2014-03-18 LAB — CMP14+EGFR
ALT: 13 IU/L (ref 0–32)
AST: 16 IU/L (ref 0–40)
Albumin/Globulin Ratio: 1.9 (ref 1.1–2.5)
Albumin: 4.5 g/dL (ref 3.6–4.8)
Alkaline Phosphatase: 73 IU/L (ref 39–117)
BUN/Creatinine Ratio: 12 (ref 11–26)
BUN: 11 mg/dL (ref 8–27)
CO2: 27 mmol/L (ref 18–29)
Calcium: 9.4 mg/dL (ref 8.7–10.3)
Chloride: 98 mmol/L (ref 97–108)
Creatinine, Ser: 0.94 mg/dL (ref 0.57–1.00)
GFR calc Af Amer: 72 mL/min/{1.73_m2} (ref >59)
GFR calc non Af Amer: 62 mL/min/{1.73_m2} (ref >59)
Globulin, Total: 2.4 g/dL (ref 1.5–4.5)
Glucose: 108 mg/dL — ABNORMAL HIGH (ref 65–99)
Potassium: 4.6 mmol/L (ref 3.5–5.2)
Sodium: 139 mmol/L (ref 134–144)
Total Bilirubin: 0.6 mg/dL (ref 0.0–1.2)
Total Protein: 6.9 g/dL (ref 6.0–8.5)

## 2014-03-18 LAB — VITAMIN D 25 HYDROXY (VIT D DEFICIENCY, FRACTURES): Vit D, 25-Hydroxy: 47.4 ng/mL (ref 30.0–100.0)

## 2014-03-18 LAB — LIPID PANEL
Chol/HDL Ratio: 3.5 ratio units (ref 0.0–4.4)
Cholesterol, Total: 215 mg/dL — ABNORMAL HIGH (ref 100–199)
HDL: 62 mg/dL (ref >39)
LDL Calculated: 130 mg/dL — ABNORMAL HIGH (ref 0–99)
Triglycerides: 116 mg/dL (ref 0–149)
VLDL Cholesterol Cal: 23 mg/dL (ref 5–40)

## 2014-03-19 ENCOUNTER — Ambulatory Visit: Payer: Medicare Other | Admitting: Family Medicine

## 2014-03-28 ENCOUNTER — Ambulatory Visit (INDEPENDENT_AMBULATORY_CARE_PROVIDER_SITE_OTHER): Payer: Medicare Other | Admitting: *Deleted

## 2014-03-28 ENCOUNTER — Ambulatory Visit: Payer: Medicare Other

## 2014-03-28 DIAGNOSIS — Z23 Encounter for immunization: Secondary | ICD-10-CM

## 2014-04-01 ENCOUNTER — Encounter: Payer: Self-pay | Admitting: *Deleted

## 2014-05-27 ENCOUNTER — Encounter: Payer: Self-pay | Admitting: *Deleted

## 2014-06-13 ENCOUNTER — Ambulatory Visit: Payer: Self-pay | Admitting: Orthopedic Surgery

## 2014-06-13 NOTE — H&P (Signed)
[name] L. Joy DOB: Oct 14, 1945 Single / Language: English / Race: White Female Date of Admission:  07/04/2014 CC: Left Hip Pain History of Present Illness  The patient is a 69 year old female who comes in for a preoperative History and Physical. The patient is scheduled for a left total hip arthroplasty (anterior approach) to be performed by Dr. Dione Plover. Aluisio, MD at [facility] on 07-04-2014. The patient is a 69 year old female who presents with a hip problem. The patient reports left posterior hip problems including pain and burning symptoms that have been present for year(s). Symptoms reported include pain with weightbearing and difficulty ambulating The patient describes the hip problem as dull, aching and burning. Onset of symptoms was with symptoms now occuring constantly.The symptoms are described as severe.The patient feels as if their symptoms are does feel they are worsening. Note for "Hip problem": She states that she was told years ago she needed both hips replaced. She did have the right hip replaced in 2009. Dr Shellia Carwin did the surgery. She did have an MRI of the hip. Unfortunately, her left hip pain is getting progressively worse over time. It started in the posterior hip but now it is hurting all throughout the groin and thigh area. It is worse with weight bearing, but she is having some discomfort at night also. She had a total hip done by Dr. Shellia Carwin on the right side in 2009, and the symptoms with this one are very similar to what she experienced prior to surgery. She has done very well with her right hip, but says it took a long time to recover. She states that she has lost a lot of motion in the left hip. She has had significant functional limitations because of it. She is ready to proceed with the left hip replacement at this time. They have been treated conservatively in the past for the above stated problem and despite conservative measures, they continue to  have progressive pain and severe functional limitations and dysfunction. They have failed non-operative management including home exercise, medications. It is felt that they would benefit from undergoing total joint replacement. Risks and benefits of the procedure have been discussed with the patient and they elect to proceed with surgery. There are no active contraindications to surgery such as ongoing infection or rapidly progressive neurological disease.  Problem List/Past Medical Primary osteoarthritis of left hip (M16.12) Left hip pain (M25.552) Chronic Pain Cancer Ovarian High blood pressure Hypercholesterolemia Impaired Vision Impaired Hearing Urinary Incontinence Urinary Tract Infection Measles Mumps  Allergies ZOFRAN Rash.  Family History  Cancer First Degree Relatives, Mother, Sister. mother, father and sister Diabetes Mellitus Father, Mother, Sister. child mother, father and sister Hypertension Father, Mother. mother and father First Degree Relatives reported  Social History Marital status widowed single Living situation live with caregiver Never consumed alcohol 11/25/2013: Never consumed alcohol Number of flights of stairs before winded greater than 5 2-3 No history of drug/alcohol rehab Children 3 Exercise Exercises rarely; does other Exercises never; does running / walking Current work status disabled retired Pain Contract no Illicit drug use no Under pain contract Tobacco use Never smoker. 11/25/2013 never smoker Alcohol use never consumed alcohol Drug/Alcohol Rehab (Previously) no Drug/Alcohol Rehab (Currently) no Post-Surgical Plans [place]  Medication History  Lisinopril (Oral) Specific dose unknown - Active. Amitriptyline HCl (Oral) Specific dose unknown - Active. Gabapentin (Oral) Specific dose unknown - Active. Hydrocodone-Acetaminophen (10-325MG  Tablet, Oral) Active. Lisinopril-Hydrochlorothiazide  (20-12.5MG  Tablet, Oral) Active. MetroNIDAZOLE (1%  Gel, External) Active. Nitrofurantoin Macrocrystal (50MG  Capsule, Oral) Active.  Past Surgical History Spinal Fusion lower back Hysterectomy complete (cancerous) Spinal Surgery Total Knee Replacement right Total Hip Replacement right Arthroscopy of Knee bilateral Carpal Tunnel Repair bilateral Cataract Surgery bilateral Hemorrhoidectomy Foot Surgery bilateral left Tubal Ligation  Review of Systems (Burnie Hank L. Aianna Fahs III PA-C; 06/13/2014 10:21 AM) General Not Present- Chills, Fatigue, Fever, Memory Loss, Night Sweats, Weight Gain and Weight Loss. Skin Not Present- Eczema, Hives, Itching, Lesions and Rash. HEENT Not Present- Dentures, Double Vision, Headache, Hearing Loss, Tinnitus and Visual Loss. Respiratory Not Present- Allergies, Chronic Cough, Coughing up blood, Shortness of breath at rest and Shortness of breath with exertion. Cardiovascular Not Present- Chest Pain, Difficulty Breathing Lying Down, Murmur, Palpitations, Racing/skipping heartbeats and Swelling. Gastrointestinal Not Present- Abdominal Pain, Bloody Stool, Constipation, Diarrhea, Difficulty Swallowing, Heartburn, Jaundice, Loss of appetitie, Nausea and Vomiting. Female Genitourinary Not Present- Blood in Urine, Discharge, Flank Pain, Incontinence, Painful Urination, Urgency, Urinary frequency, Urinary Retention, Urinating at Night and Weak urinary stream. Musculoskeletal Present- Back Pain, Joint Pain, Joint Swelling, Morning Stiffness and Muscle Pain. Not Present- Muscle Weakness and Spasms. Neurological Not Present- Blackout spells, Difficulty with balance, Dizziness, Paralysis, Tremor and Weakness. Psychiatric Not Present- Insomnia.  Vitals Weight: 190 lb Height: 62in Body Surface Area: 1.87 m Body Mass Index: 34.75 kg/m  BP: 130/76 (Sitting, Right Arm, Standard)  Physical Exam (Bessy Reaney L. Annie Roseboom III PA-C; 06/13/2014 10:32  AM) General Mental Status -Alert, cooperative and good historian. General Appearance-pleasant, Not in acute distress. Orientation-Oriented X3. Build & Nutrition-Well nourished and Well developed.  Head and Neck Head-normocephalic, atraumatic . Neck Global Assessment - supple, no bruit auscultated on the right, no bruit auscultated on the left.  Eye Vision-Wears corrective lenses. Pupil - Bilateral-Regular and Round. Motion - Bilateral-EOMI.  Chest and Lung Exam Auscultation Breath sounds - clear at anterior chest wall and clear at posterior chest wall. Adventitious sounds - No Adventitious sounds.  Cardiovascular Auscultation Rhythm - Regular rate and rhythm. Heart Sounds - S1 WNL and S2 WNL. Murmurs & Other Heart Sounds - Auscultation of the heart reveals - No Murmurs.  Abdomen Inspection Contour - Generalized mild distention. Palpation/Percussion Tenderness - Abdomen is non-tender to palpation. Rigidity (guarding) - Abdomen is soft. Auscultation Auscultation of the abdomen reveals - Bowel sounds normal.  Female Genitourinary Note: Not done, not pertinent to present illness   Musculoskeletal Note: On examination, she is a well-developed female, alert and oriented in no apparent distress. Her left hip can be flexed to 90 with no internal rotation and about 20 degrees of external rotation and 10 degrees of abduction. The right hip flexed to 110, rotate in 30, out 40 and abduct 40 without pain on range of motion. Knee examination on the left is unremarkable. Pulses are intact with intact motor.  RADIOGRAPHS: AP pelvis and lateral of the left hip show bone-on-bone arthritis in the left hip. She has some subchondral cystic formation also.   Assessment & Plan Primary osteoarthritis of left hip (M16.12) Note:Surgical Plans: Left Total Hip Repalcement - Anterior Approach  Disposition: [place] for inpatinet rehab  PCP: Dr. Laurance Flatten - Patient  has been seen preoperatively and felt to be stable for surgery.  Topical TXA - Ovarian Cancer  Anesthesia Issues: None  Signed electronically by Joelene Millin, III PA-C

## 2014-06-13 NOTE — Progress Notes (Signed)
Preoperative surgical orders have been place into the Epic hospital system for [name] on 06/13/2014, 10:45 AM  by Mickel Crow for surgery on 07-04-2014.  Preop Total Hip - Anterior Approach orders including Experel Injecion, IV Tylenol, and IV Decadron as long as there are no contraindications to the above medications. [name], PA-C

## 2014-06-18 ENCOUNTER — Ambulatory Visit (INDEPENDENT_AMBULATORY_CARE_PROVIDER_SITE_OTHER): Payer: Medicare Other | Admitting: Family Medicine

## 2014-06-18 ENCOUNTER — Encounter: Payer: Self-pay | Admitting: Family Medicine

## 2014-06-18 VITALS — BP 140/75 | HR 76 | Temp 97.5°F | Ht 60.0 in | Wt 190.6 lb

## 2014-06-18 DIAGNOSIS — R300 Dysuria: Secondary | ICD-10-CM

## 2014-06-18 DIAGNOSIS — N390 Urinary tract infection, site not specified: Secondary | ICD-10-CM

## 2014-06-18 LAB — POCT UA - MICROSCOPIC ONLY
Casts, Ur, LPF, POC: NEGATIVE
Crystals, Ur, HPF, POC: NEGATIVE
Epithelial cells, urine per micros: NEGATIVE
RBC, urine, microscopic: NEGATIVE
Yeast, UA: NEGATIVE

## 2014-06-18 LAB — POCT URINALYSIS DIPSTICK
Bilirubin, UA: NEGATIVE
Glucose, UA: NEGATIVE
Ketones, UA: NEGATIVE
Nitrite, UA: NEGATIVE
Protein, UA: NEGATIVE
Spec Grav, UA: 1.01
Urobilinogen, UA: NEGATIVE
pH, UA: 6

## 2014-06-18 MED ORDER — CIPROFLOXACIN HCL 500 MG PO TABS: 500.0000 mg | ORAL_TABLET | Freq: Two times a day (BID) | ORAL | Status: DC

## 2014-06-18 NOTE — Progress Notes (Signed)
   Subjective:    Patient ID: [name], female    DOB: 12-30-1945, 69 y.o.   MRN: [id number]  HPI Patient c/o dysuria and urinary frequency.  Review of Systems  Constitutional: Negative for fever.  HENT: Negative for ear pain.   Eyes: Negative for discharge.  Respiratory: Negative for cough.   Cardiovascular: Negative for chest pain.  Gastrointestinal: Negative for abdominal distention.  Endocrine: Negative for polyuria.  Genitourinary: Negative for difficulty urinating.  Musculoskeletal: Negative for gait problem and neck pain.  Skin: Negative for color change and rash.  Neurological: Negative for speech difficulty and headaches.  Psychiatric/Behavioral: Negative for agitation.       Objective:    BP 140/75 mmHg  Pulse 76  Temp(Src) 97.5 F (36.4 C) (Oral)  Ht 5' (1.524 m)  Wt 190 lb 9.6 oz (86.456 kg)  BMI 37.22 kg/m2 Physical Exam  Constitutional: She is oriented to person, place, and time. She appears well-developed and well-nourished.  HENT:  Head: Normocephalic and atraumatic.  Mouth/Throat: Oropharynx is clear and moist.  Eyes: Pupils are equal, round, and reactive to light.  Neck: Normal range of motion. Neck supple.  Cardiovascular: Normal rate and regular rhythm.   No murmur heard. Pulmonary/Chest: Effort normal and breath sounds normal.  Abdominal: Soft. Bowel sounds are normal. There is no tenderness.  Neurological: She is alert and oriented to person, place, and time.  Skin: Skin is warm and dry.  Psychiatric: She has a normal mood and affect.          Assessment & Plan:     ICD-9-CM ICD-10-CM   1. Dysuria 788.1 R30.0 POCT UA - Microscopic Only     POCT urinalysis dipstick     No Follow-up on file.  Lysbeth Penner FNP

## 2014-06-18 NOTE — Addendum Note (Signed)
Addended by: Jamelle Haring on: 06/18/2014 02:38 PM   Modules accepted: Miquel Dunn

## 2014-06-20 ENCOUNTER — Other Ambulatory Visit: Payer: Self-pay | Admitting: Family Medicine

## 2014-06-20 LAB — URINE CULTURE

## 2014-06-20 MED ORDER — AMOXICILLIN 875 MG PO TABS: 875.0000 mg | ORAL_TABLET | Freq: Two times a day (BID) | ORAL | Status: DC

## 2014-06-21 NOTE — Pre-Procedure Instructions (Signed)
[name]  06/21/2014   Your procedure is scheduled on:  February 26  Report to [facility] Admitting at 10:10 AM.  Call this number if you have problems the morning of surgery: [phone]   Remember:   Do not eat food or drink liquids after midnight.   Take these medicines the morning of surgery with A SIP OF WATER: Amoxicillin, Cipro, Hydrocodone (if needed), Nitrofurantoin, Nystatin   STOP Fish Oil February 19   STOP/ Do not take Aspirin, Aleve, Naproxen, Advil, Ibuprofen, Motrin, Vitamins, Herbs, or Supplements starting February 19   Do not wear jewelry, make-up or nail polish.  Do not wear lotions, powders, or perfumes. You may wear deodorant.  Do not shave 48 hours prior to surgery. Men may shave face and neck.  Do not bring valuables to the hospital.  [facility] is not responsible for any belongings or valuables.               Contacts, dentures or bridgework may not be worn into surgery.  Leave suitcase in the car. After surgery it may be brought to your room.  For patients admitted to the hospital, discharge time is determined by your treatment team.               Special Instructions: [place] - Preparing for Surgery  Before surgery, you can play an important role.  Because skin is not sterile, your skin needs to be as free of germs as possible.  You can reduce the number of germs on you skin by washing with CHG (chlorahexidine gluconate) soap before surgery.  CHG is an antiseptic cleaner which kills germs and bonds with the skin to continue killing germs even after washing.  Please DO NOT use if you have an allergy to CHG or antibacterial soaps.  If your skin becomes reddened/irritated stop using the CHG and inform your nurse when you arrive at Short Stay.  Do not shave (including legs and underarms) for at least 48 hours prior to the first CHG shower.  You may shave your face.  Please follow these instructions carefully:   1.  Shower with CHG  Soap the night before surgery and the morning of Surgery.  2.  If you choose to wash your hair, wash your hair first as usual with your normal shampoo.  3.  After you shampoo, rinse your hair and body thoroughly to remove the shampoo.  4.  Use CHG as you would any other liquid soap.  You can apply CHG directly to the skin and wash gently with scrungie or a clean washcloth.  5.  Apply the CHG Soap to your body ONLY FROM THE NECK DOWN.  Do not use on open wounds or open sores.  Avoid contact with your eyes, ears, mouth and genitals (private parts).  Wash genitals (private parts) with your normal soap.  6.  Wash thoroughly, paying special attention to the area where your surgery will be performed.  7.  Thoroughly rinse your body with warm water from the neck down.  8.  DO NOT shower/wash with your normal soap after using and rinsing off the CHG Soap.  9.  Pat yourself dry with a clean towel.            10.  Wear clean pajamas.            11.  Place clean sheets on your bed the night of your first shower and do not sleep with  pets.  Day of Surgery  Do not apply any lotions the morning of surgery.  Please wear clean clothes to the hospital/surgery center.     Please read over the following fact sheets that you were given: Pain Booklet, Coughing and Deep Breathing, Blood Transfusion Information, Total Joint Packet and Surgical Site Infection Prevention

## 2014-06-23 ENCOUNTER — Other Ambulatory Visit: Payer: Self-pay

## 2014-06-23 ENCOUNTER — Encounter (HOSPITAL_COMMUNITY)
Admission: RE | Admit: 2014-06-23 | Discharge: 2014-06-23 | Disposition: A | Discharge status: Home / Self Care | Payer: Medicare Other | Source: Ambulatory Visit | Attending: Anesthesiology | Admitting: Anesthesiology

## 2014-06-23 ENCOUNTER — Encounter (HOSPITAL_COMMUNITY)
Admission: RE | Admit: 2014-06-23 | Discharge: 2014-06-23 | Disposition: A | Discharge status: Home / Self Care | Payer: Medicare Other | Source: Ambulatory Visit | Attending: Orthopedic Surgery | Admitting: Orthopedic Surgery

## 2014-06-23 ENCOUNTER — Encounter (HOSPITAL_COMMUNITY): Payer: Self-pay

## 2014-06-23 DIAGNOSIS — Z01818 Encounter for other preprocedural examination: Secondary | ICD-10-CM | POA: Diagnosis not present

## 2014-06-23 DIAGNOSIS — M1612 Unilateral primary osteoarthritis, left hip: Secondary | ICD-10-CM | POA: Insufficient documentation

## 2014-06-23 LAB — COMPREHENSIVE METABOLIC PANEL
ALK PHOS: 58 U/L (ref 39–117)
ALT: 15 U/L (ref 0–35)
AST: 20 U/L (ref 0–37)
Albumin: 3.6 g/dL (ref 3.5–5.2)
Anion gap: 5 (ref 5–15)
BUN: 9 mg/dL (ref 6–23)
CALCIUM: 8.9 mg/dL (ref 8.4–10.5)
CO2: 26 mmol/L (ref 19–32)
CREATININE: 0.82 mg/dL (ref 0.50–1.10)
Chloride: 107 mmol/L (ref 96–112)
GFR calc Af Amer: 83 mL/min — ABNORMAL LOW (ref >90)
GFR, EST NON AFRICAN AMERICAN: 71 mL/min — AB (ref >90)
Glucose, Bld: 93 mg/dL (ref 70–99)
Potassium: 4.1 mmol/L (ref 3.5–5.1)
Sodium: 138 mmol/L (ref 135–145)
Total Bilirubin: 0.5 mg/dL (ref 0.3–1.2)
Total Protein: 6.6 g/dL (ref 6.0–8.3)

## 2014-06-23 LAB — CBC
HEMATOCRIT: 38.6 % (ref 36.0–46.0)
Hemoglobin: 13 g/dL (ref 12.0–15.0)
MCH: 30.4 pg (ref 26.0–34.0)
MCHC: 33.7 g/dL (ref 30.0–36.0)
MCV: 90.2 fL (ref 78.0–100.0)
PLATELETS: 282 10*3/uL (ref 150–400)
RBC: 4.28 MIL/uL (ref 3.87–5.11)
RDW: 12.6 % (ref 11.5–15.5)
WBC: 7.9 10*3/uL (ref 4.0–10.5)

## 2014-06-23 LAB — SURGICAL PCR SCREEN
MRSA, PCR: NEGATIVE
Staphylococcus aureus: POSITIVE — AB

## 2014-06-23 LAB — URINALYSIS, ROUTINE W REFLEX MICROSCOPIC
Bilirubin Urine: NEGATIVE
Glucose, UA: NEGATIVE mg/dL
HGB URINE DIPSTICK: NEGATIVE
KETONES UR: NEGATIVE mg/dL
Leukocytes, UA: NEGATIVE
NITRITE: NEGATIVE
PROTEIN: NEGATIVE mg/dL
Specific Gravity, Urine: 1.006 (ref 1.005–1.030)
Urobilinogen, UA: 0.2 mg/dL (ref 0.0–1.0)
pH: 6 (ref 5.0–8.0)

## 2014-06-23 LAB — PROTIME-INR
INR: 1.05 (ref 0.00–1.49)
Prothrombin Time: 13.8 seconds (ref 11.6–15.2)

## 2014-06-23 LAB — TYPE AND SCREEN
ABO/RH(D): AB POS
ANTIBODY SCREEN: NEGATIVE

## 2014-06-23 LAB — APTT: aPTT: 30 seconds (ref 24–37)

## 2014-06-23 NOTE — Progress Notes (Signed)
Nurse called prescription to [place], and then Nurse called patient and informed her of positive PCR results.

## 2014-06-23 NOTE — Progress Notes (Addendum)
With past med. Hx of urology problems, pt had gone to see her PCP, Dr. Sabra Heck @ [facility] [phone]). Please fax copy of patients UA result to Dr. Sabra Heck.   She was given antibiotics for ?/ UTI that started last Wednesday.  Otherwise, PMH is unchanged from last visit. Lab results faxed to [facility].

## 2014-07-03 MED ORDER — DEXAMETHASONE SODIUM PHOSPHATE 10 MG/ML IJ SOLN
10.0000 mg | Freq: Once | INTRAMUSCULAR | Status: AC
  Administered 2014-07-04: 10 mg via INTRAVENOUS
  Filled 2014-07-03: qty 1

## 2014-07-03 MED ORDER — BUPIVACAINE LIPOSOME 1.3 % IJ SUSP
20.0000 mL | Freq: Once | INTRAMUSCULAR | Status: DC
Start: 2014-07-03 — End: 2014-07-04
  Filled 2014-07-03: qty 20

## 2014-07-03 MED ORDER — SODIUM CHLORIDE 0.9 % IV SOLN: INTRAVENOUS | Status: DC

## 2014-07-03 MED ORDER — CHLORHEXIDINE GLUCONATE 4 % EX LIQD
60.0000 mL | Freq: Once | CUTANEOUS | Status: DC
  Filled 2014-07-03: qty 60

## 2014-07-03 MED ORDER — CEFAZOLIN SODIUM-DEXTROSE 2-3 GM-% IV SOLR
2.0000 g | INTRAVENOUS | Status: AC
  Administered 2014-07-04: 2 g via INTRAVENOUS
  Filled 2014-07-03: qty 50

## 2014-07-03 MED ORDER — ACETAMINOPHEN 10 MG/ML IV SOLN: 1000.0000 mg | Freq: Once | INTRAVENOUS | Status: DC

## 2014-07-03 MED ORDER — TRANEXAMIC ACID 100 MG/ML IV SOLN
2000.0000 mg | Freq: Once | INTRAVENOUS | Status: DC
  Filled 2014-07-03: qty 20

## 2014-07-04 ENCOUNTER — Inpatient Hospital Stay (HOSPITAL_COMMUNITY): Payer: Medicare Other

## 2014-07-04 ENCOUNTER — Inpatient Hospital Stay (HOSPITAL_COMMUNITY)
Admission: RE | Admit: 2014-07-04 | Discharge: 2014-07-07 | DRG: 470 | Disposition: A | Discharge status: Skilled Nursing Facility | Payer: Medicare Other | Source: Ambulatory Visit | Attending: Orthopedic Surgery | Admitting: Orthopedic Surgery

## 2014-07-04 ENCOUNTER — Encounter (HOSPITAL_COMMUNITY): Payer: Self-pay | Admitting: Anesthesiology

## 2014-07-04 ENCOUNTER — Inpatient Hospital Stay (HOSPITAL_COMMUNITY): Payer: Medicare Other | Admitting: Anesthesiology

## 2014-07-04 ENCOUNTER — Encounter (HOSPITAL_COMMUNITY)
Admission: RE | Disposition: A | Discharge status: Skilled Nursing Facility | Payer: Self-pay | Source: Ambulatory Visit | Attending: Orthopedic Surgery

## 2014-07-04 DIAGNOSIS — M169 Osteoarthritis of hip, unspecified: Secondary | ICD-10-CM

## 2014-07-04 DIAGNOSIS — Z419 Encounter for procedure for purposes other than remedying health state, unspecified: Secondary | ICD-10-CM

## 2014-07-04 DIAGNOSIS — E780 Pure hypercholesterolemia: Secondary | ICD-10-CM | POA: Diagnosis present

## 2014-07-04 DIAGNOSIS — Z96659 Presence of unspecified artificial knee joint: Secondary | ICD-10-CM | POA: Diagnosis present

## 2014-07-04 DIAGNOSIS — H547 Unspecified visual loss: Secondary | ICD-10-CM | POA: Diagnosis present

## 2014-07-04 DIAGNOSIS — M1612 Unilateral primary osteoarthritis, left hip: Secondary | ICD-10-CM | POA: Diagnosis present

## 2014-07-04 DIAGNOSIS — I10 Essential (primary) hypertension: Secondary | ICD-10-CM | POA: Diagnosis present

## 2014-07-04 DIAGNOSIS — Z6837 Body mass index (BMI) 37.0-37.9, adult: Secondary | ICD-10-CM | POA: Diagnosis not present

## 2014-07-04 DIAGNOSIS — E669 Obesity, unspecified: Secondary | ICD-10-CM | POA: Diagnosis present

## 2014-07-04 DIAGNOSIS — Z981 Arthrodesis status: Secondary | ICD-10-CM | POA: Diagnosis not present

## 2014-07-04 DIAGNOSIS — M25552 Pain in left hip: Secondary | ICD-10-CM | POA: Diagnosis present

## 2014-07-04 DIAGNOSIS — N390 Urinary tract infection, site not specified: Secondary | ICD-10-CM

## 2014-07-04 DIAGNOSIS — R300 Dysuria: Secondary | ICD-10-CM

## 2014-07-04 DIAGNOSIS — Z96649 Presence of unspecified artificial hip joint: Secondary | ICD-10-CM

## 2014-07-04 DIAGNOSIS — Z96651 Presence of right artificial knee joint: Secondary | ICD-10-CM | POA: Diagnosis present

## 2014-07-04 DIAGNOSIS — H9190 Unspecified hearing loss, unspecified ear: Secondary | ICD-10-CM | POA: Diagnosis present

## 2014-07-04 HISTORY — PX: TOTAL HIP ARTHROPLASTY: SHX124

## 2014-07-04 SURGERY — ARTHROPLASTY, HIP, TOTAL, ANTERIOR APPROACH
Anesthesia: General | Site: Hip | Laterality: Left

## 2014-07-04 MED ORDER — GLYCOPYRROLATE 0.2 MG/ML IJ SOLN
INTRAMUSCULAR | Status: DC | PRN
  Administered 2014-07-04: 0.6 mg via INTRAVENOUS

## 2014-07-04 MED ORDER — GLYCOPYRROLATE 0.2 MG/ML IJ SOLN
INTRAMUSCULAR | Status: AC
  Filled 2014-07-04: qty 3

## 2014-07-04 MED ORDER — HYDROMORPHONE HCL 1 MG/ML IJ SOLN
0.5000 mg | INTRAMUSCULAR | Status: AC | PRN
  Administered 2014-07-04 (×2): 0.5 mg via INTRAVENOUS

## 2014-07-04 MED ORDER — POLYETHYLENE GLYCOL 3350 17 G PO PACK
17.0000 g | PACK | Freq: Every day | ORAL | Status: DC | PRN
  Administered 2014-07-05: 17 g via ORAL
  Filled 2014-07-04: qty 1

## 2014-07-04 MED ORDER — FENTANYL CITRATE 0.05 MG/ML IJ SOLN
INTRAMUSCULAR | Status: DC | PRN
  Administered 2014-07-04: 50 ug via INTRAVENOUS
  Administered 2014-07-04: 150 ug via INTRAVENOUS
  Administered 2014-07-04: 50 ug via INTRAVENOUS

## 2014-07-04 MED ORDER — SODIUM CHLORIDE 0.9 % IJ SOLN
INTRAMUSCULAR | Status: DC | PRN
  Administered 2014-07-04: 30 mL via INTRAVENOUS

## 2014-07-04 MED ORDER — SUCCINYLCHOLINE CHLORIDE 20 MG/ML IJ SOLN
INTRAMUSCULAR | Status: AC
  Filled 2014-07-04: qty 1

## 2014-07-04 MED ORDER — HYDROMORPHONE HCL 1 MG/ML IJ SOLN
INTRAMUSCULAR | Status: AC
Start: 2014-07-04 — End: 2014-07-05
  Filled 2014-07-04: qty 1

## 2014-07-04 MED ORDER — TRAMADOL HCL 50 MG PO TABS
50.0000 mg | ORAL_TABLET | Freq: Four times a day (QID) | ORAL | Status: DC | PRN
Start: 2014-07-04 — End: 2014-07-07
  Administered 2014-07-04: 50 mg via ORAL
  Filled 2014-07-04: qty 1

## 2014-07-04 MED ORDER — METOCLOPRAMIDE HCL 10 MG PO TABS: 5.0000 mg | ORAL_TABLET | Freq: Three times a day (TID) | ORAL | Status: DC | PRN

## 2014-07-04 MED ORDER — ACETAMINOPHEN 500 MG PO TABS
1000.0000 mg | ORAL_TABLET | Freq: Four times a day (QID) | ORAL | Status: AC
  Administered 2014-07-04 – 2014-07-05 (×4): 1000 mg via ORAL
  Filled 2014-07-04 (×4): qty 2

## 2014-07-04 MED ORDER — NEOSTIGMINE METHYLSULFATE 10 MG/10ML IV SOLN
INTRAVENOUS | Status: AC
  Filled 2014-07-04: qty 1

## 2014-07-04 MED ORDER — VECURONIUM BROMIDE 10 MG IV SOLR
INTRAVENOUS | Status: AC
  Filled 2014-07-04: qty 10

## 2014-07-04 MED ORDER — LIDOCAINE HCL (CARDIAC) 20 MG/ML IV SOLN
INTRAVENOUS | Status: DC | PRN
  Administered 2014-07-04: 100 mg via INTRAVENOUS

## 2014-07-04 MED ORDER — MENTHOL 3 MG MT LOZG: 1.0000 | LOZENGE | OROMUCOSAL | Status: DC | PRN

## 2014-07-04 MED ORDER — MORPHINE SULFATE 2 MG/ML IJ SOLN: 1.0000 mg | INTRAMUSCULAR | Status: DC | PRN

## 2014-07-04 MED ORDER — TRANEXAMIC ACID 100 MG/ML IV SOLN
2000.0000 mg | INTRAVENOUS | Status: DC | PRN
  Administered 2014-07-04: 2000 mg via INTRAVENOUS

## 2014-07-04 MED ORDER — ARTIFICIAL TEARS OP OINT
TOPICAL_OINTMENT | OPHTHALMIC | Status: AC
  Filled 2014-07-04: qty 3.5

## 2014-07-04 MED ORDER — BUPIVACAINE LIPOSOME 1.3 % IJ SUSP
INTRAMUSCULAR | Status: DC | PRN
  Administered 2014-07-04: 20 mL

## 2014-07-04 MED ORDER — STERILE WATER FOR INJECTION IJ SOLN
INTRAMUSCULAR | Status: AC
  Filled 2014-07-04: qty 10

## 2014-07-04 MED ORDER — METOCLOPRAMIDE HCL 5 MG/ML IJ SOLN: 5.0000 mg | Freq: Three times a day (TID) | INTRAMUSCULAR | Status: DC | PRN

## 2014-07-04 MED ORDER — PROMETHAZINE HCL 25 MG RE SUPP: 25.0000 mg | Freq: Four times a day (QID) | RECTAL | Status: DC | PRN

## 2014-07-04 MED ORDER — GABAPENTIN 300 MG PO CAPS
300.0000 mg | ORAL_CAPSULE | Freq: Every day | ORAL | Status: DC
  Administered 2014-07-04 – 2014-07-06 (×3): 300 mg via ORAL
  Filled 2014-07-04 (×3): qty 1

## 2014-07-04 MED ORDER — PROMETHAZINE HCL 25 MG/ML IJ SOLN: 12.5000 mg | Freq: Four times a day (QID) | INTRAMUSCULAR | Status: DC | PRN

## 2014-07-04 MED ORDER — DIPHENHYDRAMINE HCL 12.5 MG/5ML PO ELIX: 12.5000 mg | ORAL_SOLUTION | ORAL | Status: DC | PRN

## 2014-07-04 MED ORDER — SODIUM CHLORIDE 0.9 % IJ SOLN
INTRAMUSCULAR | Status: AC
  Filled 2014-07-04: qty 10

## 2014-07-04 MED ORDER — AMITRIPTYLINE HCL 50 MG PO TABS
50.0000 mg | ORAL_TABLET | Freq: Every day | ORAL | Status: DC
  Administered 2014-07-04 – 2014-07-06 (×3): 50 mg via ORAL
  Filled 2014-07-04 (×3): qty 1

## 2014-07-04 MED ORDER — VECURONIUM BROMIDE 10 MG IV SOLR
INTRAVENOUS | Status: DC | PRN
  Administered 2014-07-04: 2 mg via INTRAVENOUS

## 2014-07-04 MED ORDER — DEXTROSE-NACL 5-0.9 % IV SOLN
INTRAVENOUS | Status: DC
  Administered 2014-07-05: via INTRAVENOUS

## 2014-07-04 MED ORDER — PROPOFOL 10 MG/ML IV BOLUS
INTRAVENOUS | Status: AC
  Filled 2014-07-04: qty 20

## 2014-07-04 MED ORDER — ACETAMINOPHEN 650 MG RE SUPP
650.0000 mg | Freq: Four times a day (QID) | RECTAL | Status: DC | PRN
Start: 2014-07-05 — End: 2014-07-07

## 2014-07-04 MED ORDER — LIDOCAINE HCL (CARDIAC) 20 MG/ML IV SOLN
INTRAVENOUS | Status: AC
  Filled 2014-07-04: qty 5

## 2014-07-04 MED ORDER — HYDROMORPHONE HCL 1 MG/ML IJ SOLN
INTRAMUSCULAR | Status: AC
  Filled 2014-07-04: qty 1

## 2014-07-04 MED ORDER — MEPERIDINE HCL 25 MG/ML IJ SOLN: 6.2500 mg | INTRAMUSCULAR | Status: DC | PRN

## 2014-07-04 MED ORDER — BISACODYL 10 MG RE SUPP: 10.0000 mg | Freq: Every day | RECTAL | Status: DC | PRN

## 2014-07-04 MED ORDER — ONDANSETRON HCL 4 MG/2ML IJ SOLN: 4.0000 mg | Freq: Once | INTRAMUSCULAR | Status: DC | PRN

## 2014-07-04 MED ORDER — LACTATED RINGERS IV SOLN
INTRAVENOUS | Status: DC | PRN
  Administered 2014-07-04 (×2): via INTRAVENOUS

## 2014-07-04 MED ORDER — ONDANSETRON HCL 4 MG/2ML IJ SOLN
INTRAMUSCULAR | Status: AC
  Filled 2014-07-04: qty 2

## 2014-07-04 MED ORDER — METHOCARBAMOL 1000 MG/10ML IJ SOLN
500.0000 mg | INTRAVENOUS | Status: AC
  Administered 2014-07-04: 500 mg via INTRAVENOUS
  Filled 2014-07-04: qty 5

## 2014-07-04 MED ORDER — BUPIVACAINE HCL (PF) 0.25 % IJ SOLN
INTRAMUSCULAR | Status: AC
  Filled 2014-07-04: qty 30

## 2014-07-04 MED ORDER — 0.9 % SODIUM CHLORIDE (POUR BTL) OPTIME
TOPICAL | Status: DC | PRN
  Administered 2014-07-04: 1000 mL

## 2014-07-04 MED ORDER — DOCUSATE SODIUM 100 MG PO CAPS
100.0000 mg | ORAL_CAPSULE | Freq: Two times a day (BID) | ORAL | Status: DC
  Administered 2014-07-04 – 2014-07-07 (×6): 100 mg via ORAL
  Filled 2014-07-04 (×6): qty 1

## 2014-07-04 MED ORDER — NEOSTIGMINE METHYLSULFATE 10 MG/10ML IV SOLN
INTRAVENOUS | Status: DC | PRN
  Administered 2014-07-04: 4 mg via INTRAVENOUS

## 2014-07-04 MED ORDER — EPHEDRINE SULFATE 50 MG/ML IJ SOLN
INTRAMUSCULAR | Status: AC
  Filled 2014-07-04: qty 1

## 2014-07-04 MED ORDER — LACTATED RINGERS IV SOLN
INTRAVENOUS | Status: DC
  Administered 2014-07-04: 50 mL/h via INTRAVENOUS

## 2014-07-04 MED ORDER — PHENOL 1.4 % MT LIQD: 1.0000 | OROMUCOSAL | Status: DC | PRN

## 2014-07-04 MED ORDER — SODIUM CHLORIDE 0.9 % IV SOLN
10.0000 mg | INTRAVENOUS | Status: DC | PRN
  Administered 2014-07-04: 50 ug/min via INTRAVENOUS

## 2014-07-04 MED ORDER — EPHEDRINE SULFATE 50 MG/ML IJ SOLN
INTRAMUSCULAR | Status: DC | PRN
  Administered 2014-07-04 (×3): 10 mg via INTRAVENOUS

## 2014-07-04 MED ORDER — MIDAZOLAM HCL 5 MG/5ML IJ SOLN
INTRAMUSCULAR | Status: DC | PRN
  Administered 2014-07-04: 2 mg via INTRAVENOUS

## 2014-07-04 MED ORDER — FENTANYL CITRATE 0.05 MG/ML IJ SOLN
INTRAMUSCULAR | Status: AC
  Filled 2014-07-04: qty 5

## 2014-07-04 MED ORDER — PHENYLEPHRINE 40 MCG/ML (10ML) SYRINGE FOR IV PUSH (FOR BLOOD PRESSURE SUPPORT)
PREFILLED_SYRINGE | INTRAVENOUS | Status: AC
  Filled 2014-07-04: qty 10

## 2014-07-04 MED ORDER — HYDROMORPHONE HCL 1 MG/ML IJ SOLN
0.2500 mg | INTRAMUSCULAR | Status: DC | PRN
  Administered 2014-07-04 (×4): 0.5 mg via INTRAVENOUS

## 2014-07-04 MED ORDER — ROCURONIUM BROMIDE 50 MG/5ML IV SOLN
INTRAVENOUS | Status: AC
  Filled 2014-07-04: qty 1

## 2014-07-04 MED ORDER — MIDAZOLAM HCL 2 MG/2ML IJ SOLN
INTRAMUSCULAR | Status: AC
  Filled 2014-07-04: qty 2

## 2014-07-04 MED ORDER — PROPOFOL 10 MG/ML IV BOLUS
INTRAVENOUS | Status: DC | PRN
  Administered 2014-07-04: 125 mg via INTRAVENOUS

## 2014-07-04 MED ORDER — BUPIVACAINE HCL (PF) 0.25 % IJ SOLN
INTRAMUSCULAR | Status: DC | PRN
  Administered 2014-07-04: 8 mL

## 2014-07-04 MED ORDER — ROCURONIUM BROMIDE 100 MG/10ML IV SOLN
INTRAVENOUS | Status: DC | PRN
  Administered 2014-07-04: 50 mg via INTRAVENOUS

## 2014-07-04 MED ORDER — CEFAZOLIN SODIUM-DEXTROSE 2-3 GM-% IV SOLR
2.0000 g | Freq: Four times a day (QID) | INTRAVENOUS | Status: AC
  Administered 2014-07-04 (×2): 2 g via INTRAVENOUS
  Filled 2014-07-04 (×2): qty 50

## 2014-07-04 MED ORDER — FLEET ENEMA 7-19 GM/118ML RE ENEM: 1.0000 | ENEMA | Freq: Once | RECTAL | Status: AC | PRN

## 2014-07-04 MED ORDER — PROMETHAZINE HCL 25 MG PO TABS: 25.0000 mg | ORAL_TABLET | Freq: Four times a day (QID) | ORAL | Status: DC | PRN

## 2014-07-04 MED ORDER — DEXAMETHASONE SODIUM PHOSPHATE 10 MG/ML IJ SOLN
10.0000 mg | Freq: Once | INTRAMUSCULAR | Status: AC
  Administered 2014-07-05: 10 mg via INTRAVENOUS
  Filled 2014-07-04: qty 1

## 2014-07-04 MED ORDER — METHOCARBAMOL 500 MG PO TABS
500.0000 mg | ORAL_TABLET | Freq: Four times a day (QID) | ORAL | Status: DC | PRN
  Administered 2014-07-05 – 2014-07-07 (×5): 500 mg via ORAL
  Filled 2014-07-04 (×6): qty 1

## 2014-07-04 MED ORDER — METHOCARBAMOL 1000 MG/10ML IJ SOLN
500.0000 mg | Freq: Four times a day (QID) | INTRAVENOUS | Status: DC | PRN
  Filled 2014-07-04: qty 5

## 2014-07-04 MED ORDER — OXYCODONE HCL 5 MG PO TABS
5.0000 mg | ORAL_TABLET | ORAL | Status: DC | PRN
  Administered 2014-07-05 – 2014-07-07 (×13): 10 mg via ORAL
  Filled 2014-07-04 (×13): qty 2

## 2014-07-04 MED ORDER — GLYCOPYRROLATE 0.2 MG/ML IJ SOLN
INTRAMUSCULAR | Status: AC
  Filled 2014-07-04: qty 1

## 2014-07-04 MED ORDER — ACETAMINOPHEN 325 MG PO TABS
650.0000 mg | ORAL_TABLET | Freq: Four times a day (QID) | ORAL | Status: DC | PRN
  Administered 2014-07-06: 650 mg via ORAL
  Filled 2014-07-04: qty 2

## 2014-07-04 MED ORDER — RIVAROXABAN 10 MG PO TABS
10.0000 mg | ORAL_TABLET | Freq: Every day | ORAL | Status: DC
  Administered 2014-07-05 – 2014-07-07 (×3): 10 mg via ORAL
  Filled 2014-07-04 (×3): qty 1

## 2014-07-04 SURGICAL SUPPLY — 42 items
BLADE SAW SGTL 18X1.27X75 (BLADE) ×2 IMPLANT
CAPT HIP TOTAL 2 ×1 IMPLANT
CLSR STERI-STRIP ANTIMIC 1/2X4 (GAUZE/BANDAGES/DRESSINGS) ×3 IMPLANT
DECANTER SPIKE VIAL GLASS SM (MISCELLANEOUS) ×1 IMPLANT
DRAPE C-ARM 42X72 X-RAY (DRAPES) ×2 IMPLANT
DRAPE IMP U-DRAPE 54X76 (DRAPES) ×2 IMPLANT
DRAPE STERI IOBAN 125X83 (DRAPES) ×2 IMPLANT
DRAPE U-SHAPE 47X51 STRL (DRAPES) ×5 IMPLANT
DRSG MEPILEX BORDER 4X4 (GAUZE/BANDAGES/DRESSINGS) ×1 IMPLANT
DRSG MEPILEX BORDER 4X8 (GAUZE/BANDAGES/DRESSINGS) ×2 IMPLANT
DURAPREP 26ML APPLICATOR (WOUND CARE) ×2 IMPLANT
ELECT BLADE 6.5 EXT (BLADE) ×2 IMPLANT
ELECT REM PT RETURN 9FT ADLT (ELECTROSURGICAL) ×2
ELECTRODE REM PT RTRN 9FT ADLT (ELECTROSURGICAL) ×1 IMPLANT
EVACUATOR 1/8 PVC DRAIN (DRAIN) ×2 IMPLANT
FACESHIELD WRAPAROUND (MASK) ×4 IMPLANT
FACESHIELD WRAPAROUND OR TEAM (MASK) ×2 IMPLANT
GLOVE BIO SURGEON STRL SZ7.5 (GLOVE) ×2 IMPLANT
GLOVE BIO SURGEON STRL SZ8 (GLOVE) ×4 IMPLANT
GLOVE BIOGEL PI IND STRL 8 (GLOVE) ×2 IMPLANT
GLOVE BIOGEL PI INDICATOR 8 (GLOVE) ×2
GLOVE ECLIPSE 8.0 STRL XLNG CF (GLOVE) ×2 IMPLANT
GOWN STRL REUS W/ TWL LRG LVL3 (GOWN DISPOSABLE) ×1 IMPLANT
GOWN STRL REUS W/ TWL XL LVL3 (GOWN DISPOSABLE) ×1 IMPLANT
GOWN STRL REUS W/TWL LRG LVL3 (GOWN DISPOSABLE) ×2
GOWN STRL REUS W/TWL XL LVL3 (GOWN DISPOSABLE) ×4
KIT BASIN OR (CUSTOM PROCEDURE TRAY) ×2 IMPLANT
NDL SAFETY ECLIPSE 18X1.5 (NEEDLE) ×1 IMPLANT
NEEDLE HYPO 18GX1.5 SHARP (NEEDLE) ×2
PACK TOTAL JOINT (CUSTOM PROCEDURE TRAY) ×2 IMPLANT
PACK UNIVERSAL I (CUSTOM PROCEDURE TRAY) ×2 IMPLANT
SUT ETHIBOND NAB CT1 #1 30IN (SUTURE) ×2 IMPLANT
SUT MNCRL AB 4-0 PS2 18 (SUTURE) ×1 IMPLANT
SUT VIC AB 1 CT1 27 (SUTURE) ×2
SUT VIC AB 1 CT1 27XBRD ANTBC (SUTURE) ×1 IMPLANT
SUT VIC AB 2-0 CT1 27 (SUTURE) ×4
SUT VIC AB 2-0 CT1 TAPERPNT 27 (SUTURE) ×2 IMPLANT
SUT VLOC 180 0 24IN GS25 (SUTURE) ×2 IMPLANT
SYR 20CC LL (SYRINGE) ×2 IMPLANT
SYR 50ML LL SCALE MARK (SYRINGE) ×2 IMPLANT
TOWEL OR 17X26 10 PK STRL BLUE (TOWEL DISPOSABLE) ×4 IMPLANT
TRAY FOLEY CATH 16FR SILVER (SET/KITS/TRAYS/PACK) ×2 IMPLANT

## 2014-07-04 NOTE — H&P (View-Only) (Signed)
[name] DOB: 1945/08/04 Single / Language: English / Race: White Female Date of Admission:  07/04/2014 CC: Left Hip Pain History of Present Illness  The patient is a 69 year old female who comes in for a preoperative History and Physical. The patient is scheduled for a left total hip arthroplasty (anterior approach) to be performed by Dr. Dione Plover. Aluisio, MD at [facility] on 07-04-2014. The patient is a 69 year old female who presents with a hip problem. The patient reports left posterior hip problems including pain and burning symptoms that have been present for year(s). Symptoms reported include pain with weightbearing and difficulty ambulating The patient describes the hip problem as dull, aching and burning. Onset of symptoms was with symptoms now occuring constantly.The symptoms are described as severe.The patient feels as if their symptoms are does feel they are worsening. Note for "Hip problem": She states that she was told years ago she needed both hips replaced. She did have the right hip replaced in 2009. Dr Shellia Carwin did the surgery. She did have an MRI of the hip. Unfortunately, her left hip pain is getting progressively worse over time. It started in the posterior hip but now it is hurting all throughout the groin and thigh area. It is worse with weight bearing, but she is having some discomfort at night also. She had a total hip done by Dr. Shellia Carwin on the right side in 2009, and the symptoms with this one are very similar to what she experienced prior to surgery. She has done very well with her right hip, but says it took a long time to recover. She states that she has lost a lot of motion in the left hip. She has had significant functional limitations because of it. She is ready to proceed with the left hip replacement at this time. They have been treated conservatively in the past for the above stated problem and despite conservative measures, they continue to  have progressive pain and severe functional limitations and dysfunction. They have failed non-operative management including home exercise, medications. It is felt that they would benefit from undergoing total joint replacement. Risks and benefits of the procedure have been discussed with the patient and they elect to proceed with surgery. There are no active contraindications to surgery such as ongoing infection or rapidly progressive neurological disease.  Problem List/Past Medical Primary osteoarthritis of left hip (M16.12) Left hip pain (M25.552) Chronic Pain Cancer Ovarian High blood pressure Hypercholesterolemia Impaired Vision Impaired Hearing Urinary Incontinence Urinary Tract Infection Measles Mumps  Allergies ZOFRAN Rash.  Family History  Cancer First Degree Relatives, Mother, Sister. mother, father and sister Diabetes Mellitus Father, Mother, Sister. child mother, father and sister Hypertension Father, Mother. mother and father First Degree Relatives reported  Social History Marital status widowed single Living situation live with caregiver Never consumed alcohol 11/25/2013: Never consumed alcohol Number of flights of stairs before winded greater than 5 2-3 No history of drug/alcohol rehab Children 3 Exercise Exercises rarely; does other Exercises never; does running / walking Current work status disabled retired Pain Contract no Illicit drug use no Under pain contract Tobacco use Never smoker. 11/25/2013 never smoker Alcohol use never consumed alcohol Drug/Alcohol Rehab (Previously) no Drug/Alcohol Rehab (Currently) no Post-Surgical Plans [place]  Medication History  Lisinopril (Oral) Specific dose unknown - Active. Amitriptyline HCl (Oral) Specific dose unknown - Active. Gabapentin (Oral) Specific dose unknown - Active. Hydrocodone-Acetaminophen (10-325MG  Tablet, Oral) Active. Lisinopril-Hydrochlorothiazide  (20-12.5MG  Tablet, Oral) Active. MetroNIDAZOLE (1%  Gel, External) Active. Nitrofurantoin Macrocrystal (50MG  Capsule, Oral) Active.  Past Surgical History Spinal Fusion lower back Hysterectomy complete (cancerous) Spinal Surgery Total Knee Replacement right Total Hip Replacement right Arthroscopy of Knee bilateral Carpal Tunnel Repair bilateral Cataract Surgery bilateral Hemorrhoidectomy Foot Surgery bilateral left Tubal Ligation  Review of Systems (Lydie Stammen L. Samanth Mirkin III PA-C; 06/13/2014 10:21 AM) General Not Present- Chills, Fatigue, Fever, Memory Loss, Night Sweats, Weight Gain and Weight Loss. Skin Not Present- Eczema, Hives, Itching, Lesions and Rash. HEENT Not Present- Dentures, Double Vision, Headache, Hearing Loss, Tinnitus and Visual Loss. Respiratory Not Present- Allergies, Chronic Cough, Coughing up blood, Shortness of breath at rest and Shortness of breath with exertion. Cardiovascular Not Present- Chest Pain, Difficulty Breathing Lying Down, Murmur, Palpitations, Racing/skipping heartbeats and Swelling. Gastrointestinal Not Present- Abdominal Pain, Bloody Stool, Constipation, Diarrhea, Difficulty Swallowing, Heartburn, Jaundice, Loss of appetitie, Nausea and Vomiting. Female Genitourinary Not Present- Blood in Urine, Discharge, Flank Pain, Incontinence, Painful Urination, Urgency, Urinary frequency, Urinary Retention, Urinating at Night and Weak urinary stream. Musculoskeletal Present- Back Pain, Joint Pain, Joint Swelling, Morning Stiffness and Muscle Pain. Not Present- Muscle Weakness and Spasms. Neurological Not Present- Blackout spells, Difficulty with balance, Dizziness, Paralysis, Tremor and Weakness. Psychiatric Not Present- Insomnia.  Vitals Weight: 190 lb Height: 62in Body Surface Area: 1.87 m Body Mass Index: 34.75 kg/m  BP: 130/76 (Sitting, Right Arm, Standard)  Physical Exam (Verlie Hellenbrand L. Andrewjames Weirauch III PA-C; 06/13/2014 10:32  AM) General Mental Status -Alert, cooperative and good historian. General Appearance-pleasant, Not in acute distress. Orientation-Oriented X3. Build & Nutrition-Well nourished and Well developed.  Head and Neck Head-normocephalic, atraumatic . Neck Global Assessment - supple, no bruit auscultated on the right, no bruit auscultated on the left.  Eye Vision-Wears corrective lenses. Pupil - Bilateral-Regular and Round. Motion - Bilateral-EOMI.  Chest and Lung Exam Auscultation Breath sounds - clear at anterior chest wall and clear at posterior chest wall. Adventitious sounds - No Adventitious sounds.  Cardiovascular Auscultation Rhythm - Regular rate and rhythm. Heart Sounds - S1 WNL and S2 WNL. Murmurs & Other Heart Sounds - Auscultation of the heart reveals - No Murmurs.  Abdomen Inspection Contour - Generalized mild distention. Palpation/Percussion Tenderness - Abdomen is non-tender to palpation. Rigidity (guarding) - Abdomen is soft. Auscultation Auscultation of the abdomen reveals - Bowel sounds normal.  Female Genitourinary Note: Not done, not pertinent to present illness   Musculoskeletal Note: On examination, she is a well-developed female, alert and oriented in no apparent distress. Her left hip can be flexed to 90 with no internal rotation and about 20 degrees of external rotation and 10 degrees of abduction. The right hip flexed to 110, rotate in 30, out 40 and abduct 40 without pain on range of motion. Knee examination on the left is unremarkable. Pulses are intact with intact motor.  RADIOGRAPHS: AP pelvis and lateral of the left hip show bone-on-bone arthritis in the left hip. She has some subchondral cystic formation also.   Assessment & Plan Primary osteoarthritis of left hip (M16.12) Note:Surgical Plans: Left Total Hip Repalcement - Anterior Approach  Disposition: [place] for inpatinet rehab  PCP: Dr. Laurance Flatten - Patient  has been seen preoperatively and felt to be stable for surgery.  Topical TXA - Ovarian Cancer  Anesthesia Issues: None  Signed electronically by Joelene Millin, III PA-C

## 2014-07-04 NOTE — Anesthesia Preprocedure Evaluation (Signed)
Anesthesia Evaluation  Patient identified by MRN, date of birth, ID band Patient awake    Reviewed: Allergy & Precautions, NPO status , Patient's Chart, lab work & pertinent test results  Airway Mallampati: I  TM Distance: >3 FB Neck ROM: Full    Dental   Pulmonary          Cardiovascular hypertension, Pt. on medications     Neuro/Psych    GI/Hepatic   Endo/Other    Renal/GU      Musculoskeletal   Abdominal   Peds  Hematology   Anesthesia Other Findings   Reproductive/Obstetrics                             Anesthesia Physical Anesthesia Plan  ASA: II  Anesthesia Plan: General   Post-op Pain Management:    Induction: Intravenous  Airway Management Planned: Oral ETT  Additional Equipment:   Intra-op Plan:   Post-operative Plan: Extubation in OR  Informed Consent: I have reviewed the patients History and Physical, chart, labs and discussed the procedure including the risks, benefits and alternatives for the proposed anesthesia with the patient or authorized representative who has indicated his/her understanding and acceptance.     Plan Discussed with: CRNA and Surgeon  Anesthesia Plan Comments:         Anesthesia Quick Evaluation

## 2014-07-04 NOTE — Interval H&P Note (Signed)
History and Physical Interval Note:  07/04/2014 9:37 AM  [name]  has presented today for surgery, with the diagnosis of OA LEFT HIP  The various methods of treatment have been discussed with the patient and family. After consideration of risks, benefits and other options for treatment, the patient has consented to  Procedure(s): LEFT TOTAL HIP ARTHROPLASTY ANTERIOR APPROACH (Left) as a surgical intervention .  The patient's history has been reviewed, patient examined, no change in status, stable for surgery.  I have reviewed the patient's chart and labs.  Questions were answered to the patient's satisfaction.     Gearlean Alf

## 2014-07-04 NOTE — Evaluation (Signed)
Physical Therapy Evaluation Patient Details Name: [name] MRN: [id number] DOB: 06-30-1945 Today's Date: 07/04/2014   History of Present Illness  Patient is a 69 yo female admitted 07/04/14 now s/p Lt THA - direct anterior approach.  PMH:  OA, chronic pain, HTN, HLD, back surgery, Rt TKA, Rt THA, HOH  Clinical Impression  Patient presents with problems listed below.  Will benefit from acute PT to maximize independence prior to discharge.  Recommend ST-SNF at discharge for continued therapy.    Follow Up Recommendations SNF;Supervision/Assistance - 24 hour    Equipment Recommendations  None recommended by PT    Recommendations for Other Services       Precautions / Restrictions Precautions Precautions: Fall Restrictions Weight Bearing Restrictions: Yes LLE Weight Bearing: Weight bearing as tolerated      Mobility  Bed Mobility Overal bed mobility: Needs Assistance Bed Mobility: Supine to Sit;Sit to Supine     Supine to sit: Mod assist Sit to supine: Min assist   General bed mobility comments: Verbal cues for technique.  Assist to bring LLE off of bed, and to raise trunk to sitting position.  Once upright, patient with good sitting balance.  Patient sat EOB x 8 minutes.  Required assist to bring LE's onto bed to return to supine.  Transfers                    Ambulation/Gait                Stairs            Wheelchair Mobility    Modified Rankin (Stroke Patients Only)       Balance                                             Pertinent Vitals/Pain Pain Assessment: 0-10 Pain Score: 4  Pain Location: Lt hip Pain Descriptors / Indicators: Aching Pain Intervention(s): Limited activity within patient's tolerance;Repositioned    Home Living Family/patient expects to be discharged to:: Skilled nursing facility Living Arrangements: Children (Son and daughter-in-law)             Home Equipment: Environmental consultant - 2  wheels;Shower seat;Grab bars - tub/shower;Cane - single point (Tall toilet)      Prior Function Level of Independence: Independent with assistive device(s);Needs assistance   Gait / Transfers Assistance Needed: Uses cane for ambulation outside of home.  ADL's / Homemaking Assistance Needed: Assist for meal prep        Hand Dominance        Extremity/Trunk Assessment   Upper Extremity Assessment: Generalized weakness           Lower Extremity Assessment: LLE deficits/detail   LLE Deficits / Details: Decreased strength and ROM post-op     Communication   Communication: HOH  Cognition Arousal/Alertness: Awake/alert Behavior During Therapy: WFL for tasks assessed/performed Overall Cognitive Status: Within Functional Limits for tasks assessed                      General Comments      Exercises Total Joint Exercises Ankle Circles/Pumps: AROM;Both;10 reps;Seated      Assessment/Plan    PT Assessment Patient needs continued PT services  PT Diagnosis Difficulty walking;Acute pain   PT Problem List Decreased strength;Decreased range of motion;Decreased activity tolerance;Decreased balance;Decreased mobility;Decreased knowledge of use of  DME;Pain  PT Treatment Interventions DME instruction;Gait training;Functional mobility training;Therapeutic activities;Therapeutic exercise;Patient/family education   PT Goals (Current goals can be found in the Care Plan section) Acute Rehab PT Goals Patient Stated Goal: To get stronger at the Rehab center PT Goal Formulation: With patient Time For Goal Achievement: 07/11/14 Potential to Achieve Goals: Good    Frequency 7X/week   Barriers to discharge        Co-evaluation               End of Session Equipment Utilized During Treatment: Oxygen Activity Tolerance: Patient tolerated treatment well Patient left: in bed;with call bell/phone within reach Nurse Communication: Mobility status         Time:  2353-6144 PT Time Calculation (min) (ACUTE ONLY): 12 min   Charges:   PT Evaluation $Initial PT Evaluation Tier I: 1 Procedure     PT G CodesDespina Pole 07/19/14, 5:35 PM Carita Pian. Sanjuana Kava, [place] Pager [phone]

## 2014-07-04 NOTE — Anesthesia Procedure Notes (Signed)
Procedure Name: Intubation Date/Time: 07/04/2014 11:33 AM Performed by: Neldon Newport Pre-anesthesia Checklist: Patient being monitored, Suction available, Emergency Drugs available, Patient identified and Timeout performed Patient Re-evaluated:Patient Re-evaluated prior to inductionOxygen Delivery Method: Circle system utilized Preoxygenation: Pre-oxygenation with 100% oxygen Intubation Type: IV induction Ventilation: Mask ventilation without difficulty Laryngoscope Size: Mac and 3 Grade View: Grade I Tube type: Oral Tube size: 7.0 (intubation by Lowella Fairy) mm Number of attempts: 1 Placement Confirmation: positive ETCO2,  ETT inserted through vocal cords under direct vision and breath sounds checked- equal and bilateral Secured at: 21 cm Tube secured with: Tape Dental Injury: Teeth and Oropharynx as per pre-operative assessment

## 2014-07-04 NOTE — Op Note (Signed)
OPERATIVE REPORT  PREOPERATIVE DIAGNOSIS: Osteoarthritis of the Left hip.   POSTOPERATIVE DIAGNOSIS: Osteoarthritis of the Left  hip.   PROCEDURE: Left total hip arthroplasty, anterior approach.   SURGEON: Gaynelle Arabian, MD   ASSISTANT: [name], PA-C  ANESTHESIA:  Spinal  ESTIMATED BLOOD LOSS:-250 ml   DRAINS: Hemovac x1.   COMPLICATIONS: None   CONDITION: PACU - hemodynamically stable.   BRIEF CLINICAL NOTE: [name] is a 69 y.o. female who has advanced end-  stage arthritis of her Left  hip with progressively worsening pain and  dysfunction.The patient has failed nonoperative management and presents for  total hip arthroplasty.   PROCEDURE IN DETAIL: After successful administration of spinal  anesthetic, the traction boots for the [facility] bed were placed on both  feet and the patient was placed onto the [facility] bed, boots placed into the leg  holders. The Left hip was then isolated from the perineum with plastic  drapes and prepped and draped in the usual sterile fashion. ASIS and  greater trochanter were marked and a oblique incision was made, starting  at about 1 cm lateral and 2 cm distal to the ASIS and coursing towards  the anterior cortex of the femur. The skin was cut with a 10 blade  through subcutaneous tissue to the level of the fascia overlying the  tensor fascia lata muscle. The fascia was then incised in line with the  incision at the junction of the anterior third and posterior 2/3rd. The  muscle was teased off the fascia and then the interval between the TFL  and the rectus was developed. The Hohmann retractor was then placed at  the top of the femoral neck over the capsule. The vessels overlying the  capsule were cauterized and the fat on top of the capsule was removed.  A Hohmann retractor was then placed anterior underneath the rectus  femoris to give exposure to the entire anterior capsule. A T-shaped  capsulotomy was performed. The  edges were tagged and the femoral head  was identified.       Osteophytes are removed off the superior acetabulum.  The femoral neck was then cut in situ with an oscillating saw. Traction  was then applied to the left lower extremity utilizing the [facility]  traction. The femoral head was then removed. Retractors were placed  around the acetabulum and then circumferential removal of the labrum was  performed. Osteophytes were also removed. Reaming starts at 43 mm to  medialize and  Increased in 2 mm increments to 47 mm. We reamed in  approximately 40 degrees of abduction, 20 degrees anteversion. A 48 mm  pinnacle acetabular shell was then impacted in anatomic position under  fluoroscopic guidance with excellent purchase. We did not need to place  any additional dome screws. A 28 mm neutral + 4 marathon liner was then  placed into the acetabular shell.       The femoral lift was then placed along the lateral aspect of the femur  just distal to the vastus ridge. The leg was  externally rotated and capsule  was stripped off the inferior aspect of the femoral neck down to the  level of the lesser trochanter, this was done with electrocautery. The femur was lifted after this was performed. The  leg was then placed and extended in adducted position to essentially delivering the femur. We also removed the capsule superiorly and the  piriformis from the piriformis  fossa to gain excellent exposure of the  proximal femur. Rongeur was used to remove some cancellous bone to get  into the lateral portion of the proximal femur for placement of the  initial starter reamer. The starter broaches was placed  the starter broach  and was shown to go down the center of the canal. Broaching  with the  Corail system was then performed starting at size 8, coursing  Up to size 9. A size 9 had excellent torsional and rotational  and axial stability. The trial high offset neck was then placed  with a 28 + 1.5 trial head.  The hip was then reduced. We confirmed that  the stem was in the canal both on AP and lateral x-rays. It also has excellent sizing. The hip was reduced with outstanding stability through full extension, full external rotation,  and then flexion in adduction internal rotation. AP pelvis was taken  and the leg lengths were measured and found to be exactly equal. Hip  was then dislocated again and the femoral head and neck removed. The  femoral broach was removed. Size 9 Corail stem with a high offset  neck was then impacted into the femur following native anteversion. Has  excellent purchase in the canal. Excellent torsional and rotational and  axial stability. It is confirmed to be in the canal on AP and lateral  fluoroscopic views. The 28 + 1.5 ceramic head was placed and the hip  reduced with outstanding stability. Again AP pelvis was taken and it  confirmed that the leg lengths were equal. The wound was then copiously  irrigated with saline solution and the capsule reattached and repaired  with Ethibond suture.  20 mL of Exparel mixed with 50 mL of saline then additional 20 ml of .25% Bupivicaine injected into the capsule and into the edge of the tensor fascia lata as well as subcutaneous tissue. The fascia overlying the tensor fascia lata was  then closed with a running #1 V-Loc. Subcu was closed with interrupted  2-0 Vicryl and subcuticular running 4-0 Monocryl. Incision was cleaned  and dried. Steri-Strips and a bulky sterile dressing applied. Hemovac  drain was hooked to suction and then he was awakened and transported to  recovery in stable condition.        Please note that a surgical assistant was a medical necessity for this procedure to perform it in a safe and expeditious manner. Assistant was necessary to provide appropriate retraction of vital neurovascular structures and to prevent femoral fracture and allow for anatomic placement of the prosthesis.  Gaynelle Arabian, M.D.

## 2014-07-04 NOTE — Anesthesia Postprocedure Evaluation (Signed)
Anesthesia Post Note  Patient: [name]  Procedure(s) Performed: Procedure(s) (LRB): LEFT TOTAL HIP ARTHROPLASTY ANTERIOR APPROACH (Left)  Anesthesia type: general  Patient location: PACU  Post pain: Pain level controlled  Post assessment: Patient's Cardiovascular Status Stable  Last Vitals:  Filed Vitals:   07/04/14 1540  BP: 110/48  Pulse: 75  Temp: 36.2 C  Resp: 18    Post vital signs: Reviewed and stable  Level of consciousness: sedated  Complications: No apparent anesthesia complications

## 2014-07-04 NOTE — Progress Notes (Signed)
Orthopedic Tech Progress Note Patient Details:  [name] 06/11/1945 [id number]  Ortho Devices Ortho Device/Splint Location: trapeze bar patient helper Ortho Device/Splint Interventions: Application Viewed order from doctor's order list  Hildred Priest 07/04/2014, 4:23 PM

## 2014-07-04 NOTE — Transfer of Care (Signed)
Immediate Anesthesia Transfer of Care Note  Patient: [name]  Procedure(s) Performed: Procedure(s): LEFT TOTAL HIP ARTHROPLASTY ANTERIOR APPROACH (Left)  Patient Location: PACU  Anesthesia Type:General  Level of Consciousness: awake, alert  and oriented  Airway & Oxygen Therapy: Patient Spontanous Breathing and Patient connected to nasal cannula oxygen  Post-op Assessment: Report given to RN, Post -op Vital signs reviewed and stable and Patient moving all extremities X 4  Post vital signs: Reviewed and stable  Last Vitals:  Filed Vitals:   07/04/14 0930  BP: 118/68  Pulse: 63  Temp: 36.3 C  Resp: 20    Complications: No apparent anesthesia complications

## 2014-07-05 LAB — CBC
HCT: 36.3 % (ref 36.0–46.0)
Hemoglobin: 12.3 g/dL (ref 12.0–15.0)
MCH: 31.5 pg (ref 26.0–34.0)
MCHC: 33.9 g/dL (ref 30.0–36.0)
MCV: 93.1 fL (ref 78.0–100.0)
PLATELETS: 166 10*3/uL (ref 150–400)
RBC: 3.9 MIL/uL (ref 3.87–5.11)
RDW: 13 % (ref 11.5–15.5)
WBC: 17.1 10*3/uL — AB (ref 4.0–10.5)

## 2014-07-05 LAB — BASIC METABOLIC PANEL
ANION GAP: 10 (ref 5–15)
BUN: 8 mg/dL (ref 6–23)
CALCIUM: 8.8 mg/dL (ref 8.4–10.5)
CHLORIDE: 106 mmol/L (ref 96–112)
CO2: 21 mmol/L (ref 19–32)
CREATININE: 0.82 mg/dL (ref 0.50–1.10)
GFR calc non Af Amer: 71 mL/min — ABNORMAL LOW (ref >90)
GFR, EST AFRICAN AMERICAN: 83 mL/min — AB (ref >90)
Glucose, Bld: 119 mg/dL — ABNORMAL HIGH (ref 70–99)
Potassium: 3.8 mmol/L (ref 3.5–5.1)
Sodium: 137 mmol/L (ref 135–145)

## 2014-07-05 NOTE — Evaluation (Signed)
Occupational Therapy Evaluation and Discharge Patient Details Name: [name] MRN: [id number] DOB: 01/16/1946 Today's Date: 07/05/2014    History of Present Illness Patient is a 69 yo female admitted 07/04/14 now s/p Lt THA - direct anterior approach.  PMH:  OA, chronic pain, HTN, HLD, back surgery, Rt TKA, Rt THA, HOH   Clinical Impression   This 69 yo female admitted and underwent above presents to acute OT with decreased balance, decreased mobility, increased pain all affecting her ability to care for herself at home at an Independent/Mod I level as she was pta. She will benefit from continued OT at SNF, acute OT will sign off.    Follow Up Recommendations  SNF    Equipment Recommendations  None recommended by OT       Precautions / Restrictions Precautions Precautions: Fall Restrictions Weight Bearing Restrictions: Yes LLE Weight Bearing: Weight bearing as tolerated      Mobility Bed Mobility Overal bed mobility: Needs Assistance Bed Mobility: Supine to Sit;Sit to Supine     Supine to sit: Min assist Sit to supine: Min assist      Transfers Overall transfer level: Needs assistance Equipment used: Rolling walker (2 wheeled) Transfers: Sit to/from Stand Sit to Stand: Min assist                   ADL Overall ADL's : Needs assistance/impaired Eating/Feeding: Independent;Sitting   Grooming: Wash/dry hands;Minimal assistance;Standing   Upper Body Bathing: Set up;Sitting   Lower Body Bathing: Moderate assistance (with min A sit<>stand)   Upper Body Dressing : Set up;Sitting   Lower Body Dressing: Moderate assistance (with min A sit<>stand)   Toilet Transfer: Minimal assistance;Ambulation;RW;BSC (over toilet)   Toileting- Clothing Manipulation and Hygiene: Minimal assistance;Sit to/from stand                         Pertinent Vitals/Pain Pain Assessment: 0-10 Pain Score: 2  Pain Location: left hip Pain Descriptors / Indicators:  Aching;Sore Pain Intervention(s): Monitored during session;Repositioned     Hand Dominance  right   Extremity/Trunk Assessment Upper Extremity Assessment Upper Extremity Assessment: Overall WFL for tasks assessed              Cognition Arousal/Alertness: Awake/alert Behavior During Therapy: WFL for tasks assessed/performed Overall Cognitive Status: Within Functional Limits for tasks assessed                                Home Living Family/patient expects to be discharged to:: Skilled nursing facility                                             OT Diagnosis: Generalized weakness;Acute pain   OT Problem List: Decreased strength;Decreased range of motion;Impaired balance (sitting and/or standing);Pain;Obesity;Decreased knowledge of use of DME or AE      OT Goals(Current goals can be found in the care plan section) Acute Rehab OT Goals Patient Stated Goal: to rehab then home  OT Frequency:                End of Session Equipment Utilized During Treatment: Gait belt;Rolling walker  Activity Tolerance: Patient tolerated treatment well Patient left: in bed;with call bell/phone within reach;with family/visitor present   Time: 1430-1459 OT Time Calculation (min): 29 min  Charges:  OT General Charges $OT Visit: 1 Procedure OT Evaluation $Initial OT Evaluation Tier I: 1 Procedure OT Treatments $Self Care/Home Management : 8-22 mins  Almon Register [phone] 07/05/2014, 3:54 PM

## 2014-07-05 NOTE — Progress Notes (Addendum)
Clinical Social Work Department CLINICAL SOCIAL WORK PLACEMENT NOTE 07/05/2014  Patient:  [name], [name]  Account Number:  [id number] Admit date:  07/04/2014  Clinical Social Worker:  Maryln Manuel  Date/time:  07/05/2014 02:54 PM  Clinical Social Work is seeking post-discharge placement for this patient at the following level of care:   SKILLED NURSING   (*CSW will update this form in Epic as items are completed)   07/05/2014  Patient/family provided with [place] Department of Clinical Social Work's list of facilities offering this level of care within the geographic area requested by the patient (or if unable, by the patient's family).  07/05/2014  Patient/family informed of their freedom to choose among providers that offer the needed level of care, that participate in Medicare, Medicaid or managed care program needed by the patient, have an available bed and are willing to accept the patient.  07/05/2014  Patient/family informed of [facility]' ownership interest in [facility], as well as of the fact that they are under no obligation to receive care at this facility.  PASARR submitted to EDS on 07/05/2014 PASARR number received on 07/05/2014  FL2 transmitted to all facilities in geographic area requested by pt/family on  07/05/2014 FL2 transmitted to all facilities within larger geographic area on   Patient informed that his/her managed care company has contracts with or will negotiate with  certain facilities, including the following:     Patient/family informed of bed offers received:  07/06/2014 Patient chooses bed at [facility] Physician recommends and patient chooses bed at  n/a  Patient to be transferred to  [facility] on  07/07/2014 Patient to be transferred to facility by [facility] Patient and family notified of transfer on 07/07/2014 Name of family member notified:  Cloyde Reams Zenaida Deed)  The following physician request were entered in  Epic: Physician Request  Please sign FL2.  Please prepare priority discharge summary and prescriptions.    Additional Comments: Pt requesting [facility]. Pt insurance [employer] requires auth prior to pt d/c to SNF.   Alison Murray, MSW, LCSW Clinical Social Work Weekend Coverage

## 2014-07-05 NOTE — Progress Notes (Signed)
Clinical Social Work Department BRIEF PSYCHOSOCIAL ASSESSMENT 07/05/2014  Patient:  [name],[name]     Account Number:  [id number]     Admit date:  07/04/2014  Clinical Social Worker:  KIDD,SUZANNA, LCSWA  Date/Time:  07/05/2014 02:30 PM  Referred by:  Physician  Date Referred:  07/05/2014 Referred for  SNF Placement   Other Referral:   Interview type:  Patient Other interview type:   and multiple family members at bedside    PSYCHOSOCIAL DATA Living Status:  FAMILY Admitted from facility:   Level of care:   Primary support name:  Melinda Damaso/dtr-in-law/[phone] Primary support relationship to patient:  FAMILY Degree of support available:   strong    CURRENT CONCERNS Current Concerns  Post-Acute Placement   Other Concerns:    SOCIAL WORK ASSESSMENT / PLAN CSW received referral for new SNF.    CSW met with pt at bedside. Multiple family members present at this time. CSW introduced self and explained role. Pt in good spirits and expressed that she feels that she is progressing well. CSW discussed recommendation for short term rehab upon discharge. Pt states that she wants to go to [facility] for short term rehab. CSW expressed understanding and discussed that CSW will send information to [facility] and send information to pt insurance in order to obtain SNF auth prior to pt d/c. Pt appreciative.    CSW completed FL2 and initiated SNF search to [facility].    CSW to submit pt clinicals to [employer] for initiation of SNF authorization.    Weekday CSW to follow up with pt to ensure [facility] has bed available for anticipated d/c Mon.    CSW to continue to follow to provide support and assist with disposition needs.   Assessment/plan status:  Psychosocial Support/Ongoing Assessment of Needs Other assessment/ plan:   discharge planning   Information/referral to community resources:   Referral to [facility]     PATIENT'S/FAMILY'S RESPONSE TO PLAN OF CARE: Pt alert and oriented x 4. Pt has strong family support as evidenced by multiple visitors at bedside. Pt eager for rehab in order to return home and regain her independence.    Suzanna Kidd, MSW, LCSW Clinical Social Work Weekend Coverage 

## 2014-07-05 NOTE — Progress Notes (Signed)
   Subjective: 1 Day Post-Op Procedure(s) (LRB): LEFT TOTAL HIP ARTHROPLASTY ANTERIOR APPROACH (Left) Patient reports pain as mild.   Plan is to go Skilled nursing facility after hospital stay.  Objective: Vital signs in last 24 hours: Temp:  [97 F (36.1 C)-98.3 F (36.8 C)] 98.3 F (36.8 C) (02/27 0642) Pulse Rate:  [59-93] 73 (02/27 0642) Resp:  [12-21] 18 (02/27 0642) BP: (105-142)/(40-75) 105/42 mmHg (02/27 0642) SpO2:  [96 %-100 %] 96 % (02/27 0642) Weight:  [192 lb (87.091 kg)-192 lb 3 oz (87.176 kg)] 192 lb (87.091 kg) (02/26 2047)  Intake/Output from previous day:  Intake/Output Summary (Last 24 hours) at 07/05/14 0735 Last data filed at 07/05/14 0645  Gross per 24 hour  Intake   1240 ml  Output   2875 ml  Net  -1635 ml    Intake/Output this shift:    Labs: No results for input(s): HGB in the last 72 hours. No results for input(s): WBC, RBC, HCT, PLT in the last 72 hours. No results for input(s): NA, K, CL, CO2, BUN, CREATININE, GLUCOSE, CALCIUM in the last 72 hours. No results for input(s): LABPT, INR in the last 72 hours.  EXAM General - Patient is Alert, Appropriate and Oriented Extremity - Neurologically intact Neurovascular intact No cellulitis present Compartment soft Dressing - dressing C/D/I Motor Function - intact, moving foot and toes well on exam.  Hemovac pulled without difficulty.  Past Medical History  Diagnosis Date  . Endometrioid adenocarcinoma   . Hypertension   . Obesity (BMI 30-39.9) 04/27/2012  . Arthritis   . Hyperlipidemia   . Ureteral adhesion, left 2010    s/p ureterolysis & stenting Dr. Larwance Sachs now  . 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 Colon polyp, hyperplastic 04/27/2012    Colonoscopy 2010.  No adenomatous polyps   . History of endometrial cancer s/p TAH BSO June2013 06/15/2011    Grade 1 endometrioid adenocarcinoma with focal superficial myometrial  invasion of 0.1 cm with a myometrium of 1.8 cm or approximately 7%. She had a 5-cm tumor. No lymphovascular space Involvement.    . Mass of in fold of jejunum s/p SB resection 07/19/2012 07/20/2012  . UTI (urinary tract infection)   . SBO (small bowel obstruction) - recurrent 04/27/2012    Assessment/Plan: 1 Day Post-Op Procedure(s) (LRB): LEFT TOTAL HIP ARTHROPLASTY ANTERIOR APPROACH (Left) Principal Problem:   OA (osteoarthritis) of hip   Advance diet Up with therapy D/C IV fluids Discharge to SNF Monday  DVT Prophylaxis - Xarelto Weight Bearing As Tolerated left Leg Hemovac Pulled   [name]

## 2014-07-05 NOTE — Progress Notes (Signed)
Physical Therapy Treatment Patient Details Name: [name] MRN: [id number] DOB: [birth year] Today's Date: 07/05/2014    History of Present Illness Patient is a 69 yo female admitted 07/04/14 now s/p Lt THA - direct anterior approach.  PMH:  OA, chronic pain, HTN, HLD, back surgery, Rt TKA, Rt THA, HOH    PT Comments    Patient making improvements with mobility and gait.  Pain is limiting factor - patient declined PT in am due to pain.  Will continue therapy in am.  Follow Up Recommendations  SNF;Supervision/Assistance - 24 hour     Equipment Recommendations  None recommended by PT    Recommendations for Other Services       Precautions / Restrictions Precautions Precautions: Fall Restrictions Weight Bearing Restrictions: Yes LLE Weight Bearing: Weight bearing as tolerated    Mobility  Bed Mobility Overal bed mobility: Needs Assistance Bed Mobility: Sit to Supine     Supine to sit: Min assist Sit to supine: Min assist   General bed mobility comments: Assist to bring LE's on to bed to return to supine.  Transfers Overall transfer level: Needs assistance Equipment used: Rolling walker (2 wheeled) Transfers: Sit to/from Stand Sit to Stand: Min assist         General transfer comment: Verbal cues for hand placement.  Patient requires min assist to rise to standing from chair and 3-in-1.    Ambulation/Gait Ambulation/Gait assistance: Min assist Ambulation Distance (Feet): 36 Feet Assistive device: Rolling walker (2 wheeled) Gait Pattern/deviations: Step-to pattern;Decreased stance time - left;Decreased step length - right;Decreased weight shift to left;Antalgic;Wide base of support Gait velocity: Decreased Gait velocity interpretation: Below normal speed for age/gender General Gait Details: Verbal cues for safe use of RW.  Patient with very wide BOS - cues to bring LE's closer together during gait.  Antalgic gait pattern.   Stairs             Wheelchair Mobility    Modified Rankin (Stroke Patients Only)       Balance                                    Cognition Arousal/Alertness: Awake/alert Behavior During Therapy: WFL for tasks assessed/performed Overall Cognitive Status: Within Functional Limits for tasks assessed                      Exercises Total Joint Exercises Ankle Circles/Pumps: AROM;Both;10 reps;Supine Quad Sets: AROM;Left;10 reps;Supine Heel Slides: AAROM;Left;10 reps;Supine Hip ABduction/ADduction: AAROM;Left;10 reps;Supine    General Comments        Pertinent Vitals/Pain Pain Assessment: 0-10 Pain Score: 6  (with ambulation) Pain Location: Lt hip/leg Pain Descriptors / Indicators: Aching;Sore Pain Intervention(s): Premedicated before session;Monitored during session;Repositioned    Home Living Family/patient expects to be discharged to:: Skilled nursing facility                    Prior Function            PT Goals (current goals can now be found in the care plan section) Acute Rehab PT Goals Patient Stated Goal: to rehab then home Progress towards PT goals: Progressing toward goals    Frequency  7X/week    PT Plan Current plan remains appropriate    Co-evaluation             End of Session Equipment Utilized During Treatment: Gait belt  Activity Tolerance: Patient limited by pain Patient left: in bed;with call bell/phone within reach;with bed alarm set;with family/visitor present     Time: 1638-4536 PT Time Calculation (min) (ACUTE ONLY): 26 min  Charges:  $Gait Training: 8-22 mins $Therapeutic Exercise: 8-22 mins                    G Codes:      Despina Pole 07-07-14, 5:47 PM Carita Pian. Sanjuana Kava, [place] Pager [phone]

## 2014-07-06 LAB — BASIC METABOLIC PANEL
ANION GAP: 8 (ref 5–15)
BUN: 10 mg/dL (ref 6–23)
CO2: 26 mmol/L (ref 19–32)
Calcium: 8.9 mg/dL (ref 8.4–10.5)
Chloride: 105 mmol/L (ref 96–112)
Creatinine, Ser: 0.63 mg/dL (ref 0.50–1.10)
GFR calc Af Amer: >90 mL/min (ref >90)
GFR calc non Af Amer: 89 mL/min — ABNORMAL LOW (ref >90)
GLUCOSE: 123 mg/dL — AB (ref 70–99)
POTASSIUM: 4.1 mmol/L (ref 3.5–5.1)
Sodium: 139 mmol/L (ref 135–145)

## 2014-07-06 LAB — CBC
HEMATOCRIT: 33 % — AB (ref 36.0–46.0)
HEMOGLOBIN: 11 g/dL — AB (ref 12.0–15.0)
MCH: 30.9 pg (ref 26.0–34.0)
MCHC: 33.3 g/dL (ref 30.0–36.0)
MCV: 92.7 fL (ref 78.0–100.0)
PLATELETS: 270 10*3/uL (ref 150–400)
RBC: 3.56 MIL/uL — ABNORMAL LOW (ref 3.87–5.11)
RDW: 13.1 % (ref 11.5–15.5)
WBC: 18.6 10*3/uL — ABNORMAL HIGH (ref 4.0–10.5)

## 2014-07-06 MED ORDER — OXYCODONE HCL 5 MG PO TABS: 5.0000 mg | ORAL_TABLET | ORAL | Status: DC | PRN

## 2014-07-06 MED ORDER — METHOCARBAMOL 500 MG PO TABS: 500.0000 mg | ORAL_TABLET | Freq: Four times a day (QID) | ORAL | Status: DC | PRN

## 2014-07-06 MED ORDER — PROMETHAZINE HCL 25 MG PO TABS: 25.0000 mg | ORAL_TABLET | Freq: Four times a day (QID) | ORAL | Status: DC | PRN

## 2014-07-06 MED ORDER — POLYETHYLENE GLYCOL 3350 17 G PO PACK: 17.0000 g | PACK | Freq: Every day | ORAL | Status: DC | PRN

## 2014-07-06 MED ORDER — BISACODYL 10 MG RE SUPP: 10.0000 mg | Freq: Every day | RECTAL | Status: DC | PRN

## 2014-07-06 MED ORDER — TRAMADOL HCL 50 MG PO TABS: 50.0000 mg | ORAL_TABLET | Freq: Four times a day (QID) | ORAL | Status: DC | PRN

## 2014-07-06 MED ORDER — METOCLOPRAMIDE HCL 5 MG PO TABS: 5.0000 mg | ORAL_TABLET | Freq: Three times a day (TID) | ORAL | Status: DC | PRN

## 2014-07-06 MED ORDER — ACETAMINOPHEN 325 MG PO TABS: 650.0000 mg | ORAL_TABLET | Freq: Four times a day (QID) | ORAL | Status: DC | PRN

## 2014-07-06 MED ORDER — RIVAROXABAN 10 MG PO TABS
10.0000 mg | ORAL_TABLET | Freq: Every day | ORAL | Status: DC
Start: 2014-07-06 — End: 2014-09-30

## 2014-07-06 NOTE — Progress Notes (Signed)
KELSEIGH DIVER  MRN: [id number] DOB/Age: [birth year] 69 y.o. Physician: Ander Slade, M.D. 2 Days Post-Op Procedure(s) (LRB): LEFT TOTAL HIP ARTHROPLASTY ANTERIOR APPROACH (Left)  Subjective: Resting comfortably in bedside chair, minimal pain Vital Signs Temp:  [97.8 F (36.6 C)-98.6 F (37 C)] 97.9 F (36.6 C) (02/28 0459) Pulse Rate:  [57-72] 57 (02/28 0459) Resp:  [16-18] 16 (02/28 0459) BP: (116-132)/(51-62) 116/53 mmHg (02/28 0459) SpO2:  [95 %-97 %] 97 % (02/28 0459)  Lab Results  Recent Labs  07/05/14 1140 07/06/14 0550  WBC 17.1* 18.6*  HGB 12.3 11.0*  HCT 36.3 33.0*  PLT 166 270   BMET  Recent Labs  07/05/14 1140 07/06/14 0550  NA 137 139  K 3.8 4.1  CL 106 105  CO2 21 26  GLUCOSE 119* 123*  BUN 8 10  CREATININE 0.82 0.63  CALCIUM 8.8 8.9   INR  Date Value Ref Range Status  06/23/2014 1.05 0.00 - 1.49 Final     Exam  Dressings dry, n/v intact LLE  Plan To SNF tomorrow  Levy Cedano M 07/06/2014, 10:11 AM

## 2014-07-06 NOTE — Progress Notes (Signed)
Physical Therapy Treatment Patient Details Name: [name] MRN: [id number] DOB: 1946/03/06 Today's Date: 07/06/2014    History of Present Illness Patient is a 69 yo female admitted 07/04/14 now s/p Lt THA - direct anterior approach.  PMH:  OA, chronic pain, HTN, HLD, back surgery, Rt TKA, Rt THA, HOH    PT Comments    Patient making progress with mobility and gait.  Continues to require assist for balance/safety.  Follow Up Recommendations  SNF;Supervision/Assistance - 24 hour     Equipment Recommendations  None recommended by PT    Recommendations for Other Services       Precautions / Restrictions Precautions Precautions: Fall Restrictions Weight Bearing Restrictions: Yes LLE Weight Bearing: Weight bearing as tolerated    Mobility  Bed Mobility                  Transfers Overall transfer level: Needs assistance Equipment used: Rolling walker (2 wheeled) Transfers: Sit to/from Stand Sit to Stand: Min assist         General transfer comment: Patient using correct hand placement.  Able to power up to standing, with assist for balance/safety.  Patient with posterior lean initially in stance.  Ambulation/Gait Ambulation/Gait assistance: Min assist Ambulation Distance (Feet): 110 Feet Assistive device: Rolling walker (2 wheeled) Gait Pattern/deviations: Step-through pattern;Decreased stance time - left;Decreased step length - right;Decreased step length - left;Antalgic Gait velocity: Decreased Gait velocity interpretation: Below normal speed for age/gender General Gait Details: Verbal cues for safe use of RW.  Cues to keep hands on hand grips - patient moves hands forward of grips.  And to keep RW slightly ahead of her to prevent posterior loss of balance.   Stairs            Wheelchair Mobility    Modified Rankin (Stroke Patients Only)       Balance                                    Cognition Arousal/Alertness:  Awake/alert Behavior During Therapy: WFL for tasks assessed/performed Overall Cognitive Status: Within Functional Limits for tasks assessed                      Exercises Total Joint Exercises Ankle Circles/Pumps: AROM;Both;10 reps;Seated Quad Sets: AROM;Both;10 reps;Seated Heel Slides: AROM;Left;10 reps;Seated Hip ABduction/ADduction: AAROM;Left;10 reps;Seated    General Comments        Pertinent Vitals/Pain Pain Assessment: 0-10 Pain Score: 5  Pain Location: Lt hip/LE Pain Descriptors / Indicators: Aching;Sore Pain Intervention(s): Monitored during session;Repositioned    Home Living                      Prior Function            PT Goals (current goals can now be found in the care plan section) Progress towards PT goals: Progressing toward goals    Frequency  7X/week    PT Plan Current plan remains appropriate    Co-evaluation             End of Session Equipment Utilized During Treatment: Gait belt Activity Tolerance: Patient tolerated treatment well Patient left: in chair;with call bell/phone within reach     Time: 1026-1049 PT Time Calculation (min) (ACUTE ONLY): 23 min  Charges:  $Gait Training: 8-22 mins $Therapeutic Exercise: 8-22 mins  G Codes:      Despina Pole 07/06/2014, 11:07 AM Carita Pian. Sanjuana Kava, [place] Pager [phone]

## 2014-07-06 NOTE — Discharge Summary (Signed)
Physician Discharge Summary   Patient ID: [name] MRN: [id number] DOB/AGE: [birth year] 69 y.o.  Admit date: 07/04/2014 Discharge date: 07/07/2014  Primary Diagnosis:  Osteoarthritis of the Left hip.  Admission Diagnoses:  Past Medical History  Diagnosis Date  . Endometrioid adenocarcinoma   . Hypertension   . Obesity (BMI 30-39.9) 04/27/2012  . Arthritis   . Hyperlipidemia   . Ureteral adhesion, left 2010    s/p ureterolysis & stenting Dr. Larwance Sachs now  . 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 Colon polyp, hyperplastic 04/27/2012    Colonoscopy 2010.  No adenomatous polyps   . History of endometrial cancer s/p TAH BSO June2013 06/15/2011    Grade 1 endometrioid adenocarcinoma with focal superficial myometrial invasion of 0.1 cm with a myometrium of 1.8 cm or approximately 7%. She had a 5-cm tumor. No lymphovascular space Involvement.    . Mass of in fold of jejunum s/p SB resection 07/19/2012 07/20/2012  . UTI (urinary tract infection)   . SBO (small bowel obstruction) - recurrent 04/27/2012   Discharge Diagnoses:   Principal Problem:   OA (osteoarthritis) of hip  Estimated body mass index is 37.5 kg/(m^2) as calculated from the following:   Height as of this encounter: 5' (1.524 m).   Weight as of this encounter: 87.091 kg (192 lb).  Procedure(s) (LRB): LEFT TOTAL HIP ARTHROPLASTY ANTERIOR APPROACH (Left)   Consults: None  HPI: [name] is a 69 y.o. female who has advanced end-  stage arthritis of her Left hip with progressively worsening pain and  dysfunction.The patient has failed nonoperative management and presents for  total hip arthroplasty.   Laboratory Data: Admission on 07/04/2014  Component Date Value Ref Range Status  . WBC 07/05/2014 17.1* 4.0 - 10.5 K/uL Final  . RBC 07/05/2014 3.90  3.87 - 5.11 MIL/uL Final  . Hemoglobin 07/05/2014 12.3  12.0 - 15.0 g/dL Final  . HCT  07/05/2014 36.3  36.0 - 46.0 % Final  . MCV 07/05/2014 93.1  78.0 - 100.0 fL Final  . MCH 07/05/2014 31.5  26.0 - 34.0 pg Final  . MCHC 07/05/2014 33.9  30.0 - 36.0 g/dL Final  . RDW 07/05/2014 13.0  11.5 - 15.5 % Final  . Platelets 07/05/2014 166  150 - 400 K/uL Final  . Sodium 07/05/2014 137  135 - 145 mmol/L Final  . Potassium 07/05/2014 3.8  3.5 - 5.1 mmol/L Final  . Chloride 07/05/2014 106  96 - 112 mmol/L Final  . CO2 07/05/2014 21  19 - 32 mmol/L Final  . Glucose, Bld 07/05/2014 119* 70 - 99 mg/dL Final  . BUN 07/05/2014 8  6 - 23 mg/dL Final  . Creatinine, Ser 07/05/2014 0.82  0.50 - 1.10 mg/dL Final  . Calcium 07/05/2014 8.8  8.4 - 10.5 mg/dL Final  . GFR calc non Af Amer 07/05/2014 71* >90 mL/min Final  . GFR calc Af Amer 07/05/2014 83* >90 mL/min Final   Comment: (NOTE) The eGFR has been calculated using the CKD EPI equation. This calculation has not been validated in all clinical situations. eGFR's persistently <90 mL/min signify possible Chronic Kidney Disease.   . Anion gap 07/05/2014 10  5 - 15 Final  . WBC 07/06/2014 18.6* 4.0 - 10.5 K/uL Final  . RBC 07/06/2014 3.56* 3.87 - 5.11 MIL/uL Final  . Hemoglobin 07/06/2014 11.0* 12.0 - 15.0 g/dL Final  . HCT 07/06/2014 33.0* 36.0 - 46.0 % Final  .  MCV 07/06/2014 92.7  78.0 - 100.0 fL Final  . MCH 07/06/2014 30.9  26.0 - 34.0 pg Final  . MCHC 07/06/2014 33.3  30.0 - 36.0 g/dL Final  . RDW 07/06/2014 13.1  11.5 - 15.5 % Final  . Platelets 07/06/2014 270  150 - 400 K/uL Final   Comment: REPEATED TO VERIFY DELTA CHECK NOTED   . Sodium 07/06/2014 139  135 - 145 mmol/L Final  . Potassium 07/06/2014 4.1  3.5 - 5.1 mmol/L Final  . Chloride 07/06/2014 105  96 - 112 mmol/L Final  . CO2 07/06/2014 26  19 - 32 mmol/L Final  . Glucose, Bld 07/06/2014 123* 70 - 99 mg/dL Final  . BUN 07/06/2014 10  6 - 23 mg/dL Final  . Creatinine, Ser 07/06/2014 0.63  0.50 - 1.10 mg/dL Final  . Calcium 07/06/2014 8.9  8.4 - 10.5 mg/dL Final  .  GFR calc non Af Amer 07/06/2014 89* >90 mL/min Final  . GFR calc Af Amer 07/06/2014 >90  >90 mL/min Final   Comment: (NOTE) The eGFR has been calculated using the CKD EPI equation. This calculation has not been validated in all clinical situations. eGFR's persistently <90 mL/min signify possible Chronic Kidney Disease.   Georgiann Hahn gap 07/06/2014 8  5 - 15 Final  Hospital Outpatient Visit on 06/23/2014  Component Date Value Ref Range Status  . MRSA, PCR 06/23/2014 NEGATIVE  NEGATIVE Final  . Staphylococcus aureus 06/23/2014 POSITIVE* NEGATIVE Final   Comment:        The Xpert SA Assay (FDA approved for NASAL specimens in patients over 33 years of age), is one component of a comprehensive surveillance program.  Test performance has been validated by [facility] for patients greater than or equal to 2 year old. It is not intended to diagnose infection nor to guide or monitor treatment.   Marland Kitchen aPTT 06/23/2014 30  24 - 37 seconds Final  . WBC 06/23/2014 7.9  4.0 - 10.5 K/uL Final  . RBC 06/23/2014 4.28  3.87 - 5.11 MIL/uL Final  . Hemoglobin 06/23/2014 13.0  12.0 - 15.0 g/dL Final  . HCT 06/23/2014 38.6  36.0 - 46.0 % Final  . MCV 06/23/2014 90.2  78.0 - 100.0 fL Final  . MCH 06/23/2014 30.4  26.0 - 34.0 pg Final  . MCHC 06/23/2014 33.7  30.0 - 36.0 g/dL Final  . RDW 06/23/2014 12.6  11.5 - 15.5 % Final  . Platelets 06/23/2014 282  150 - 400 K/uL Final  . Sodium 06/23/2014 138  135 - 145 mmol/L Final  . Potassium 06/23/2014 4.1  3.5 - 5.1 mmol/L Final  . Chloride 06/23/2014 107  96 - 112 mmol/L Final  . CO2 06/23/2014 26  19 - 32 mmol/L Final  . Glucose, Bld 06/23/2014 93  70 - 99 mg/dL Final  . BUN 06/23/2014 9  6 - 23 mg/dL Final  . Creatinine, Ser 06/23/2014 0.82  0.50 - 1.10 mg/dL Final  . Calcium 06/23/2014 8.9  8.4 - 10.5 mg/dL Final  . Total Protein 06/23/2014 6.6  6.0 - 8.3 g/dL Final  . Albumin 06/23/2014 3.6  3.5 - 5.2 g/dL Final  . AST 06/23/2014 20  0 - 37 U/L Final    . ALT 06/23/2014 15  0 - 35 U/L Final  . Alkaline Phosphatase 06/23/2014 58  39 - 117 U/L Final  . Total Bilirubin 06/23/2014 0.5  0.3 - 1.2 mg/dL Final  . GFR calc non Af Amer 06/23/2014 71* >90  mL/min Final  . GFR calc Af Amer 06/23/2014 83* >90 mL/min Final   Comment: (NOTE) The eGFR has been calculated using the CKD EPI equation. This calculation has not been validated in all clinical situations. eGFR's persistently <90 mL/min signify possible Chronic Kidney Disease.   . Anion gap 06/23/2014 5  5 - 15 Final  . Prothrombin Time 06/23/2014 13.8  11.6 - 15.2 seconds Final  . INR 06/23/2014 1.05  0.00 - 1.49 Final  . ABO/RH(D) 06/23/2014 AB POS   Final  . Antibody Screen 06/23/2014 NEG   Final  . Sample Expiration 06/23/2014 07/07/2014   Final  . Color, Urine 06/23/2014 YELLOW  YELLOW Final  . APPearance 06/23/2014 CLEAR  CLEAR Final  . Specific Gravity, Urine 06/23/2014 1.006  1.005 - 1.030 Final  . pH 06/23/2014 6.0  5.0 - 8.0 Final  . Glucose, UA 06/23/2014 NEGATIVE  NEGATIVE mg/dL Final  . Hgb urine dipstick 06/23/2014 NEGATIVE  NEGATIVE Final  . Bilirubin Urine 06/23/2014 NEGATIVE  NEGATIVE Final  . Ketones, ur 06/23/2014 NEGATIVE  NEGATIVE mg/dL Final  . Protein, ur 06/23/2014 NEGATIVE  NEGATIVE mg/dL Final  . Urobilinogen, UA 06/23/2014 0.2  0.0 - 1.0 mg/dL Final  . Nitrite 06/23/2014 NEGATIVE  NEGATIVE Final  . Leukocytes, UA 06/23/2014 NEGATIVE  NEGATIVE Final   MICROSCOPIC NOT DONE ON URINES WITH NEGATIVE PROTEIN, BLOOD, LEUKOCYTES, NITRITE, OR GLUCOSE <1000 mg/dL.  Office Visit on 06/18/2014  Component Date Value Ref Range Status  . WBC, Ur, HPF, POC 06/18/2014 3-5   Final  . RBC, urine, microscopic 06/18/2014 neg   Final  . Bacteria, U Microscopic 06/18/2014 occ   Final  . Mucus, UA 06/18/2014 many   Final  . Epithelial cells, urine per micros 06/18/2014 neg   Final  . Crystals, Ur, HPF, POC 06/18/2014 neg   Final  . Casts, Ur, LPF, POC 06/18/2014 neg   Final  .  Yeast, UA 06/18/2014 neg   Final  . Color, UA 06/18/2014 yellow   Final  . Clarity, UA 06/18/2014 clear   Final  . Glucose, UA 06/18/2014 neg   Final  . Bilirubin, UA 06/18/2014 neg   Final  . Ketones, UA 06/18/2014 neg   Final  . Spec Grav, UA 06/18/2014 1.010   Final  . Blood, UA 06/18/2014 trace   Final  . pH, UA 06/18/2014 6.0   Final  . Protein, UA 06/18/2014 neg   Final  . Urobilinogen, UA 06/18/2014 negative   Final  . Nitrite, UA 06/18/2014 neg   Final  . Leukocytes, UA 06/18/2014 large (3+)   Final  . Urine Culture, Routine 06/18/2014 Final report*  Final  . Result 1 06/18/2014 Proteus mirabilis*  Final   25,000-50,000 colony forming units per mL  . RESULT 2 06/18/2014 Comment   Final   Comment: Mixed urogenital flora Less than 10,000 colonies/mL   . ANTIMICROBIAL SUSCEPTIBILITY 06/18/2014 Comment   Final   Comment:       ** S = Susceptible; I = Intermediate; R = Resistant **                    P = Positive; N = Negative             MICS are expressed in micrograms per mL    Antibiotic                 RSLT#1    RSLT#2    RSLT#3    RSLT#4  Amoxicillin/Clavulanic Acid    S Ampicillin                     S Cefepime                       S Ceftriaxone                    S Cefuroxime                     S Cephalothin                    S Ciprofloxacin                  R Ertapenem                      S Gentamicin                     R Levofloxacin                   R Nitrofurantoin                 R Piperacillin                   S Tetracycline                   R Tobramycin                     I Trimethoprim/Sulfa             R      X-Rays:Dg Chest 2 View  06/23/2014   CLINICAL DATA:  Left hip arthroplasty.  Hypertension.  EXAM: CHEST  2 VIEW  COMPARISON:  None.  FINDINGS: Mediastinum and hilar structures normal. Left basilar segment atelectasis and/or infiltrate. No pleural effusion or pneumothorax. No acute bony abnormality. Surgical screw in of the lumbar spine.   IMPRESSION: Left base subsegmental atelectasis versus infiltrate.   Electronically Signed   By: Marcello Moores  Register   On: 06/23/2014 12:10   Dg Pelvis Portable  07/04/2014   CLINICAL DATA:  Postop  EXAM: PORTABLE PELVIS 1-2 VIEWS  COMPARISON:  Intraoperative film same day and 07/10/2013  FINDINGS: Single frontal view of the pelvis submitted. There is new left hip prosthesis in anatomic alignment. Postsurgical changes are noted with small amount of periarticular soft tissue air. Postsurgical drain in place. Stable right hip prosthesis.  IMPRESSION: Left hip prosthesis anatomic alignment. Postsurgical changes are noted.   Electronically Signed   By: Lahoma Crocker M.D.   On: 07/04/2014 14:26   Dg Hip Operative Unilat With Pelvis Left  07/04/2014   CLINICAL DATA:  Left hip replacement  EXAM: OPERATIVE left HIP (WITH PELVIS IF PERFORMED) 1 VIEWS  TECHNIQUE: Fluoroscopic spot image(s) were submitted for interpretation post-operatively.  Fluoroscopy time 15 seconds  COMPARISON:  None.  FINDINGS: Single frontal view of the pelvis submitted. Partially visualized left hip prosthesis in anatomic alignment. Partial visualized right hip prosthesis.  IMPRESSION: Partially visualized left hip prosthesis in anatomic alignment.   Electronically Signed   By: Lahoma Crocker M.D.   On: 07/04/2014 12:50    EKG: Orders placed or performed in visit on 06/23/14  . EKG 12-Lead     Hospital Course: Patient was admitted to [facility] and taken to  the OR and underwent the above state procedure without complications.  Patient tolerated the procedure well and was later transferred to the recovery room and then to the orthopaedic floor for postoperative care.  They were given PO and IV analgesics for pain control following their surgery.  They were given 24 hours of postoperative antibiotics of  Anti-infectives    Start     Dose/Rate Route Frequency Ordered Stop   07/07/14 0000  ciprofloxacin (CIPRO) 500 MG tablet     500 mg  Oral 2 times daily 07/07/14 0621     07/04/14 1730  ceFAZolin (ANCEF) IVPB 2 g/50 mL premix     2 g 100 mL/hr over 30 Minutes Intravenous Every 6 hours 07/04/14 1550 07/04/14 2340   07/04/14 0600  ceFAZolin (ANCEF) IVPB 2 g/50 mL premix     2 g 100 mL/hr over 30 Minutes Intravenous On call to O.R. 07/03/14 1356 07/04/14 1134     and started on DVT prophylaxis in the form of Xarelto.   PT and OT were ordered for total hip protocol.  The patient was allowed to be WBAT with therapy. Discharge planning was consulted to help with postop disposition and equipment needs.  Patient had a decent night on the evening of surgery with only mild pain.  They started to get up OOB with therapy on day one.  Hemovac drain was pulled without difficulty.  Continued to work with therapy into day two.  Dressing was changed on day two and the incision was healing well.  By day three, the patient had progressed with therapy and meeting their goals.  Incision was healing well.  Patient was seen in rounds on day three by Dr. Wynelle Link and was ready to go home to the SNF for further care.  Diet: Cardiac diet Activity:WBAT Follow-up:in 2 weeks Disposition - Skilled nursing facility Discharged Condition: good       Discharge Instructions    Call MD / Call 911    Complete by:  As directed   If you experience chest pain or shortness of breath, CALL 911 and be transported to the hospital emergency room.  If you develope a fever above 101 F, pus (white drainage) or increased drainage or redness at the wound, or calf pain, call your surgeon's office.     Change dressing    Complete by:  As directed   You may change your dressing dressing daily with sterile 4 x 4 inch gauze dressing and paper tape.  Do not submerge the incision under water.     Constipation Prevention    Complete by:  As directed   Drink plenty of fluids.  Prune juice may be helpful.  You may use a stool softener, such as Colace (over the counter) 100 mg  twice a day.  Use MiraLax (over the counter) for constipation as needed.     Diet - low sodium heart healthy    Complete by:  As directed      Discharge instructions    Complete by:  As directed   Pick up stool softner and laxative for home use following surgery while on pain medications. Do not submerge incision under water. Please use good hand washing techniques while changing dressing each day. May shower starting three days after surgery. Please use a clean towel to pat the incision dry following showers. Continue to use ice for pain and swelling after surgery. Do not use any lotions or creams on the incision until instructed by  your surgeon.  Total Hip Protocol.  Take Xarelto for two and a half more weeks, then discontinue Xarelto. Once the patient has completed the blood thinner regimen, then take a Baby 81 mg Aspirin daily for three more weeks.  Postoperative Constipation Protocol  Constipation - defined medically as fewer than three stools per week and severe constipation as less than one stool per week.  One of the most common issues patients have following surgery is constipation.  Even if you have a regular bowel pattern at home, your normal regimen is likely to be disrupted due to multiple reasons following surgery.  Combination of anesthesia, postoperative narcotics, change in appetite and fluid intake all can affect your bowels.  In order to avoid complications following surgery, here are some recommendations in order to help you during your recovery period.  Colace (docusate) - Pick up an over-the-counter form of Colace or another stool softener and take twice a day as long as you are requiring postoperative pain medications.  Take with a full glass of water daily.  If you experience loose stools or diarrhea, hold the colace until you stool forms back up.  If your symptoms do not get better within 1 week or if they get worse, check with your doctor.  Dulcolax (bisacodyl) -  Pick up over-the-counter and take as directed by the product packaging as needed to assist with the movement of your bowels.  Take with a full glass of water.  Use this product as needed if not relieved by Colace only.   MiraLax (polyethylene glycol) - Pick up over-the-counter to have on hand.  MiraLax is a solution that will increase the amount of water in your bowels to assist with bowel movements.  Take as directed and can mix with a glass of water, juice, soda, coffee, or tea.  Take if you go more than two days without a movement. Do not use MiraLax more than once per day. Call your doctor if you are still constipated or irregular after using this medication for 7 days in a row.  If you continue to have problems with postoperative constipation, please contact the office for further assistance and recommendations.  If you experience "the worst abdominal pain ever" or develop nausea or vomiting, please contact the office immediatly for further recommendations for treatment.  When discharged from the skilled rehab facility, please have the facility set up the patient's [place] prior to being released.   Also provide the patient with their medications at time of release from the facility to include their pain medication, the muscle relaxants, and their blood thinner medication.  If the patient is still at the rehab facility at time of follow up appointment, please also assist the patient in arranging follow up appointment in our office and any transportation needs. ICE to the affected knee or hip every three hours for 30 minutes at a time and then as needed for pain and swelling.      Do not sit on low chairs, stoools or toilet seats, as it may be difficult to get up from low surfaces    Complete by:  As directed      Driving restrictions    Complete by:  As directed   No driving until released by the physician.     Increase activity slowly as tolerated    Complete by:  As  directed      Lifting restrictions    Complete by:  As directed  No lifting until released by the physician.     Patient may shower    Complete by:  As directed   You may shower without a dressing once there is no drainage.  Do not wash over the wound.  If drainage remains, do not shower until drainage stops.     TED hose    Complete by:  As directed   Use stockings (TED hose) for 3 weeks on both leg(s).  You may remove them at night for sleeping.     Weight bearing as tolerated    Complete by:  As directed   Laterality:  left  Extremity:  Lower            Medication List    STOP taking these medications        amoxicillin 875 MG tablet  Commonly known as:  AMOXIL     fish oil-omega-3 fatty acids 1000 MG capsule     HYDROcodone-acetaminophen 10-325 MG per tablet  Commonly known as:  NORCO      TAKE these medications        acetaminophen 325 MG tablet  Commonly known as:  TYLENOL  Take 2 tablets (650 mg total) by mouth every 6 (six) hours as needed for mild pain (or Fever >/= 101).     amitriptyline 50 MG tablet  Commonly known as:  ELAVIL  Take 1 tablet (50 mg total) by mouth at bedtime.     bisacodyl 10 MG suppository  Commonly known as:  DULCOLAX  Place 1 suppository (10 mg total) rectally daily as needed for moderate constipation.     ciprofloxacin 500 MG tablet  Commonly known as:  CIPRO  Take 1 tablet (500 mg total) by mouth 2 (two) times daily. Take twice a day for three days     docusate sodium 100 MG capsule  Commonly known as:  COLACE  Take 100 mg by mouth at bedtime.     gabapentin 300 MG capsule  Commonly known as:  NEURONTIN  Take 1 capsule (300 mg total) by mouth at bedtime.     lisinopril-hydrochlorothiazide 20-12.5 MG per tablet  Commonly known as:  PRINZIDE,ZESTORETIC     methocarbamol 500 MG tablet  Commonly known as:  ROBAXIN  Take 1 tablet (500 mg total) by mouth every 6 (six) hours as needed for muscle spasms.     metoCLOPramide 5  MG tablet  Commonly known as:  REGLAN  Take 1 tablet (5 mg total) by mouth every 8 (eight) hours as needed for nausea (if ondansetron (ZOFRAN) ineffective.).     metroNIDAZOLE 1 % gel  Commonly known as:  METROGEL  Apply topically daily.     nitrofurantoin 50 MG capsule  Commonly known as:  MACRODANTIN  Take 50 mg by mouth 4 (four) times daily.     nystatin cream  Commonly known as:  MYCOSTATIN  Apply 1 application topically 2 (two) times daily.     oxyCODONE 5 MG immediate release tablet  Commonly known as:  Oxy IR/ROXICODONE  Take 1-2 tablets (5-10 mg total) by mouth every 3 (three) hours as needed for moderate pain, severe pain or breakthrough pain.     polyethylene glycol packet  Commonly known as:  MIRALAX / GLYCOLAX  Take 17 g by mouth daily as needed for mild constipation.     promethazine 25 MG tablet  Commonly known as:  PHENERGAN  Take 1 tablet (25 mg total) by mouth every 6 (six) hours as needed for  nausea.     rivaroxaban 10 MG Tabs tablet  Commonly known as:  XARELTO  - Take 1 tablet (10 mg total) by mouth daily with breakfast. Take Xarelto for two and a half more weeks, then discontinue Xarelto.  - Once the patient has completed the blood thinner regimen, then take a Baby 81 mg Aspirin daily for three more weeks./     traMADol 50 MG tablet  Commonly known as:  ULTRAM  Take 1-2 tablets (50-100 mg total) by mouth every 6 (six) hours as needed (mild pain).       Follow-up Information    Follow up with Gearlean Alf, MD In 2 weeks.   Specialty:  Orthopedic Surgery   Why:  Call ASAP at [phone] to setup appointment with Dr. Wynelle Link for two weeks.   Contact information:   [address] [place] [address] [phone]       Signed: [name], PA-C Orthopaedic Surgery 07/07/2014, 6:22 AM

## 2014-07-06 NOTE — Discharge Instructions (Signed)
Dr. Gaynelle Arabian Total Joint Specialist [facility] [address] [place],  [address] [phone]    ANTERIOR APPROACH TOTAL HIP REPLACEMENT POSTOPERATIVE DIRECTIONS   Hip Rehabilitation, Guidelines Following Surgery  The results of a hip operation are greatly improved after range of motion and muscle strengthening exercises. Follow all safety measures which are given to protect your hip. If any of these exercises cause increased pain or swelling in your joint, decrease the amount until you are comfortable again. Then slowly increase the exercises. Call your caregiver if you have problems or questions.  HOME CARE INSTRUCTIONS  Most of the following instructions are designed to prevent the dislocation of your new hip.  Remove items at home which could result in a fall. This includes throw rugs or furniture in walking pathways.  Continue medications as instructed at time of discharge.  You may have some home medications which will be placed on hold until you complete the course of blood thinner medication.  You may start showering once you are discharged home but do not submerge the incision under water. Just pat the incision dry and apply a dry gauze dressing on daily. Do not put on socks or shoes without following the instructions of your caregivers.  Sit on high chairs which makes it easier to stand.  Sit on chairs with arms. Use the chair arms to help push yourself up when arising.  Keep your leg on the side of the operation out in front of you when standing up.  Arrange for the use of a toilet seat elevator so you are not sitting low.    Walk with walker as instructed.  You may resume a sexual relationship in one month or when given the OK by your caregiver.  Use walker as long as suggested by your caregivers.  You may put full weight on your legs and walk as much as is comfortable. Avoid periods of inactivity such as sitting longer than an hour  when not asleep. This helps prevent blood clots.  You may return to work once you are cleared by Engineer, production.  Do not drive a car for 6 weeks or until released by your surgeon.  Do not drive while taking narcotics.  Wear elastic stockings for three weeks following surgery during the day but you may remove then at night.  Make sure you keep all of your appointments after your operation with all of your doctors and caregivers. You should call the office at the above phone number and make an appointment for approximately two weeks after the date of your surgery. Change the dressing daily and reapply a dry dressing each time. Please pick up a stool softener and laxative for home use as long as you are requiring pain medications.  ICE to the affected hip every three hours for 30 minutes at a time and then as needed for pain and swelling.  Continue to use ice on the hip for pain and swelling from surgery. You may notice swelling that will progress down to the foot and ankle.  This is normal after surgery.  Elevate the leg when you are not up walking on it.   It is important for you to complete the blood thinner medication as prescribed by your doctor.  Continue to use the breathing machine which will help keep your temperature down.  It is common for your temperature to cycle up and down following surgery, especially at night when you are not up moving around  and exerting yourself.  The breathing machine keeps your lungs expanded and your temperature down.  RANGE OF MOTION AND STRENGTHENING EXERCISES  These exercises are designed to help you keep full movement of your hip joint. Follow your caregiver's or physical therapist's instructions. Perform all exercises about fifteen times, three times per day or as directed. Exercise both hips, even if you have had only one joint replacement. These exercises can be done on a training (exercise) mat, on the floor, on a table or on a bed. Use whatever works the best  and is most comfortable for you. Use music or television while you are exercising so that the exercises are a pleasant break in your day. This will make your life better with the exercises acting as a break in routine you can look forward to.  Lying on your back, slowly slide your foot toward your buttocks, raising your knee up off the floor. Then slowly slide your foot back down until your leg is straight again.  Lying on your back spread your legs as far apart as you can without causing discomfort.  Lying on your side, raise your upper leg and foot straight up from the floor as far as is comfortable. Slowly lower the leg and repeat.  Lying on your back, tighten up the muscle in the front of your thigh (quadriceps muscles). You can do this by keeping your leg straight and trying to raise your heel off the floor. This helps strengthen the largest muscle supporting your knee.  Lying on your back, tighten up the muscles of your buttocks both with the legs straight and with the knee bent at a comfortable angle while keeping your heel on the floor.   SKILLED REHAB INSTRUCTIONS: If the patient is transferred to a skilled rehab facility following release from the hospital, a list of the current medications will be sent to the facility for the patient to continue.  When discharged from the skilled rehab facility, please have the facility set up the patient's [place] prior to being released. Also, the skilled facility will be responsible for providing the patient with their medications at time of release from the facility to include their pain medication, the muscle relaxants, and their blood thinner medication. If the patient is still at the rehab facility at time of the two week follow up appointment, the skilled rehab facility will also need to assist the patient in arranging follow up appointment in our office and any transportation needs.  MAKE SURE YOU:  Understand these instructions.   Will watch your condition.  Will get help right away if you are not doing well or get worse.  Pick up stool softner and laxative for home use following surgery while on pain medications. Do not submerge incision under water. Please use good hand washing techniques while changing dressing each day. May shower starting three days after surgery. Please use a clean towel to pat the incision dry following showers. Continue to use ice for pain and swelling after surgery. Do not use any lotions or creams on the incision until instructed by your surgeon. Total Hip Protocol.  Take Xarelto for two and a half more weeks, then discontinue Xarelto. Once the patient has completed the blood thinner regimen, then take a Baby 81 mg Aspirin daily for three more weeks.  Postoperative Constipation Protocol  Constipation - defined medically as fewer than three stools per week and severe constipation as less than one stool per week.  One  of the most common issues patients have following surgery is constipation.  Even if you have a regular bowel pattern at home, your normal regimen is likely to be disrupted due to multiple reasons following surgery.  Combination of anesthesia, postoperative narcotics, change in appetite and fluid intake all can affect your bowels.  In order to avoid complications following surgery, here are some recommendations in order to help you during your recovery period.  Colace (docusate) - Pick up an over-the-counter form of Colace or another stool softener and take twice a day as long as you are requiring postoperative pain medications.  Take with a full glass of water daily.  If you experience loose stools or diarrhea, hold the colace until you stool forms back up.  If your symptoms do not get better within 1 week or if they get worse, check with your doctor.  Dulcolax (bisacodyl) - Pick up over-the-counter and take as directed by the product packaging as needed to assist with the  movement of your bowels.  Take with a full glass of water.  Use this product as needed if not relieved by Colace only.   MiraLax (polyethylene glycol) - Pick up over-the-counter to have on hand.  MiraLax is a solution that will increase the amount of water in your bowels to assist with bowel movements.  Take as directed and can mix with a glass of water, juice, soda, coffee, or tea.  Take if you go more than two days without a movement. Do not use MiraLax more than once per day. Call your doctor if you are still constipated or irregular after using this medication for 7 days in a row.  If you continue to have problems with postoperative constipation, please contact the office for further assistance and recommendations.  If you experience "the worst abdominal pain ever" or develop nausea or vomiting, please contact the office immediatly for further recommendations for treatment.   When discharged from the skilled rehab facility, please have the facility set up the patient's [place] prior to being released.  Also provide the patient with their medications at time of release from the facility to include their pain medication, the muscle relaxants, and their blood thinner medication.  If the patient is still at the rehab facility at time of follow up appointment, please also assist the patient in arranging follow up appointment in our office and any transportation needs. ICE to the affected knee or hip every three hours for 30 minutes at a time and then as needed for pain and swelling.

## 2014-07-07 ENCOUNTER — Encounter (HOSPITAL_COMMUNITY): Payer: Self-pay | Admitting: Orthopedic Surgery

## 2014-07-07 LAB — URINE MICROSCOPIC-ADD ON

## 2014-07-07 LAB — CBC
HEMATOCRIT: 33 % — AB (ref 36.0–46.0)
Hemoglobin: 11 g/dL — ABNORMAL LOW (ref 12.0–15.0)
MCH: 31.1 pg (ref 26.0–34.0)
MCHC: 33.3 g/dL (ref 30.0–36.0)
MCV: 93.2 fL (ref 78.0–100.0)
PLATELETS: 243 10*3/uL (ref 150–400)
RBC: 3.54 MIL/uL — ABNORMAL LOW (ref 3.87–5.11)
RDW: 13.2 % (ref 11.5–15.5)
WBC: 12.7 10*3/uL — ABNORMAL HIGH (ref 4.0–10.5)

## 2014-07-07 LAB — URINALYSIS, ROUTINE W REFLEX MICROSCOPIC
BILIRUBIN URINE: NEGATIVE
Glucose, UA: NEGATIVE mg/dL
KETONES UR: NEGATIVE mg/dL
Nitrite: NEGATIVE
PH: 6.5 (ref 5.0–8.0)
Protein, ur: NEGATIVE mg/dL
SPECIFIC GRAVITY, URINE: 1.01 (ref 1.005–1.030)
Urobilinogen, UA: 0.2 mg/dL (ref 0.0–1.0)

## 2014-07-07 MED ORDER — CIPROFLOXACIN HCL 500 MG PO TABS: 500.0000 mg | ORAL_TABLET | Freq: Two times a day (BID) | ORAL | Status: DC

## 2014-07-07 NOTE — Progress Notes (Signed)
Utilization review completed.  

## 2014-07-07 NOTE — Clinical Social Work Note (Signed)
Patient was discharged prior to [place] obtaining authorization from [facility].  RN (in training) was unaware this needed to be obtained prior to transport.  Facility aware patient in transit and willing to work with insurance approval not being obtained.  CSW provided necessary education to RN regarding placement discharges.  RN aware.  RN called report to facility and offered explanation and apologies as did CSW.  CSW contacted [facility] (who states they have not received the faxed clinicals though both weekend CSW and this CSW has received confirmation of clinicals being received).  CSW following to follow up with [facility] [employer] to inquire re: authorization.  [name], [place] [phone]  Psychiatric & Orthopedics (5N 1-16) Clinical Social Worker

## 2014-07-07 NOTE — Progress Notes (Signed)
Physical Therapy Treatment Patient Details Name: [name] MRN: [id number] DOB: 1945-08-18 Today's Date: 07/07/2014    History of Present Illness Patient is a 68 yo female admitted 07/04/14 now s/p Lt THA - direct anterior approach.  PMH:  OA, chronic pain, HTN, HLD, back surgery, Rt TKA, Rt THA, HOH    PT Comments    Pt expressed fatigue after ambulating 25 ft and will benefit from further therapy to increase endurance.  Pt required verbal cues to push off from bed rather than pulling on RW during sit>stand.  Pt is anticipating d/c this afternoon to SNF.    Follow Up Recommendations  SNF;Supervision/Assistance - 24 hour     Equipment Recommendations  None recommended by PT    Recommendations for Other Services       Precautions / Restrictions Precautions Precautions: Fall Restrictions Weight Bearing Restrictions: Yes LLE Weight Bearing: Weight bearing as tolerated    Mobility  Bed Mobility Overal bed mobility: Needs Assistance Bed Mobility: Supine to Sit;Sit to Supine     Supine to sit: Min assist (assistance w/ sliding LLE off of bed) Sit to supine: Min assist (assistance w/ moving LLE into bed)      Transfers Overall transfer level: Needs assistance Equipment used: Rolling walker (2 wheeled) Transfers: Sit to/from Stand Sit to Stand: Min guard         General transfer comment: Pt required verbal cues to push off using both UEs from bed rather than pulling on walker during sit<>stand  Ambulation/Gait Ambulation/Gait assistance: Min guard Ambulation Distance (Feet): 50 Feet Assistive device: Rolling walker (2 wheeled) Gait Pattern/deviations: Step-to pattern;Decreased stride length;Decreased stance time - left;Antalgic Gait velocity: Decreased Gait velocity interpretation: Below normal speed for age/gender General Gait Details: Pt fatigued quickly and asked to turn around after 25 ft   Stairs            Wheelchair Mobility    Modified  Rankin (Stroke Patients Only)       Balance                                    Cognition Arousal/Alertness: Awake/alert Behavior During Therapy: WFL for tasks assessed/performed Overall Cognitive Status: Within Functional Limits for tasks assessed                      Exercises Total Joint Exercises Ankle Circles/Pumps: AROM;Both;10 reps;Supine (prior to ambulation) Hip ABduction/ADduction: 5 reps;Left;AAROM;Supine (mod assitance) Straight Leg Raises: AAROM;Left;5 reps;Supine (mod assistance)    General Comments        Pertinent Vitals/Pain Pain Assessment: 0-10 Pain Score: 8  Pain Location: Lt hip Pain Descriptors / Indicators: Aching;Discomfort;Grimacing;Guarding Pain Intervention(s): Limited activity within patient's tolerance;Monitored during session;Repositioned    Home Living                      Prior Function            PT Goals (current goals can now be found in the care plan section) Progress towards PT goals: Progressing toward goals    Frequency  7X/week    PT Plan Current plan remains appropriate    Co-evaluation             End of Session Equipment Utilized During Treatment: Gait belt Activity Tolerance: Patient limited by fatigue;Patient limited by pain Patient left: in bed;with call bell/phone within reach (instructed pt to  continue to perform ankle pumps)     Time: 5248-1859 PT Time Calculation (min) (ACUTE ONLY): 18 min  Charges:  $Gait Training: 8-22 mins                    G CodesJoslyn Hy PT, [place] [phone] 07/07/2014, 11:48 AM

## 2014-07-07 NOTE — Progress Notes (Signed)
Pt stated that she thought she had a UTI. Urine is cloudy and malodorous. U/A sent. Fluids encouraged.

## 2014-07-07 NOTE — Progress Notes (Signed)
   Subjective: 3 Days Post-Op Procedure(s) (LRB): LEFT TOTAL HIP ARTHROPLASTY ANTERIOR APPROACH (Left) Patient reports pain as mild.   Patient seen in rounds by Dr. Wynelle Link. Patient is having some dysuria so will place on oral ABX at discharge Patient is ready to go too the SNF  Objective: Vital signs in last 24 hours: Temp:  [97.3 F (36.3 C)-98.7 F (37.1 C)] 98.7 F (37.1 C) (02/28 2142) Pulse Rate:  [62-65] 65 (02/28 2142) Resp:  [16-17] 16 (02/28 2142) BP: (115-118)/(54-63) 115/54 mmHg (02/28 2142) SpO2:  [100 %] 100 % (02/28 2142)  Intake/Output from previous day:  Intake/Output Summary (Last 24 hours) at 07/07/14 0618 Last data filed at 07/07/14 0600  Gross per 24 hour  Intake   1320 ml  Output      0 ml  Net   1320 ml    Intake/Output this shift: Total I/O In: 360 [P.O.:360] Out: -   Labs:  Recent Labs  07/05/14 1140 07/06/14 0550  HGB 12.3 11.0*    Recent Labs  07/05/14 1140 07/06/14 0550  WBC 17.1* 18.6*  RBC 3.90 3.56*  HCT 36.3 33.0*  PLT 166 270    Recent Labs  07/05/14 1140 07/06/14 0550  NA 137 139  K 3.8 4.1  CL 106 105  CO2 21 26  BUN 8 10  CREATININE 0.82 0.63  GLUCOSE 119* 123*  CALCIUM 8.8 8.9   No results for input(s): LABPT, INR in the last 72 hours.  EXAM: General - Patient is Alert, Appropriate and Oriented Extremity - Neurovascular intact Sensation intact distally Dorsiflexion/Plantar flexion intact Incision - clean, dry, no drainage Motor Function - intact, moving foot and toes well on exam.   Assessment/Plan: 3 Days Post-Op Procedure(s) (LRB): LEFT TOTAL HIP ARTHROPLASTY ANTERIOR APPROACH (Left) Procedure(s) (LRB): LEFT TOTAL HIP ARTHROPLASTY ANTERIOR APPROACH (Left) Past Medical History  Diagnosis Date  . Endometrioid adenocarcinoma   . Hypertension   . Obesity (BMI 30-39.9) 04/27/2012  . Arthritis   . Hyperlipidemia   . Ureteral adhesion, left 2010    s/p ureterolysis & stenting Dr. Larwance Sachs now  . 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 Colon polyp, hyperplastic 04/27/2012    Colonoscopy 2010.  No adenomatous polyps   . History of endometrial cancer s/p TAH BSO June2013 06/15/2011    Grade 1 endometrioid adenocarcinoma with focal superficial myometrial invasion of 0.1 cm with a myometrium of 1.8 cm or approximately 7%. She had a 5-cm tumor. No lymphovascular space Involvement.    . Mass of in fold of jejunum s/p SB resection 07/19/2012 07/20/2012  . UTI (urinary tract infection)   . SBO (small bowel obstruction) - recurrent 04/27/2012   Principal Problem:   OA (osteoarthritis) of hip  Estimated body mass index is 37.5 kg/(m^2) as calculated from the following:   Height as of this encounter: 5' (1.524 m).   Weight as of this encounter: 87.091 kg (192 lb). Up with therapy Discharge to SNF Diet - Cardiac diet Follow up - in 2 weeks Activity - WBAT Disposition - Skilled nursing facility Condition Upon Discharge - Good D/C Meds - See DC Summary DVT Prophylaxis - Xarelto  [name], PA-C Orthopaedic Surgery 07/07/2014, 6:18 AM

## 2014-07-07 NOTE — Care Management Note (Signed)
CARE MANAGEMENT NOTE 07/07/2014  Patient:  [name], [name]   Account Number:  [id number]  Date Initiated:  07/07/2014  Documentation initiated by:  Ricki Miller  Subjective/Objective Assessment:   69 yr old female admitted with osteoarthritis of left hip , 07/05/14 patient had left total hip replacement.     Action/Plan:   Patient will go to SNF for shortterm rehab. Social worker has arranged. Going to [facility].   Anticipated DC Date:  07/07/2014   Anticipated DC Plan:  SKILLED NURSING FACILITY  In-house referral  Clinical Social Worker      DC Planning Services  CM consult      Choice offered to / List presented to:     DME arranged  NA        [place] arranged  NA      Status of service:  Completed, signed off Medicare Important Message given?   (If response is "NO", the following Medicare IM given date fields will be blank) Date Medicare IM given:   Medicare IM given by:   Date Additional Medicare IM given:   Additional Medicare IM given by:    Discharge Disposition:  [place]  Per UR Regulation:    If discussed at Long Length of Stay Meetings, dates discussed:    Comments:  07/07/14 Patient discharged prior to receiving IM letter.

## 2014-07-07 NOTE — Discharge Planning (Signed)
Patient will discharge today per MD order. Patient will discharge to [facility] RN to call report prior to transportation to [phone] Transportation: daughter  CSW sent discharge summary to SNF for review.  Packet is complete.  RN, patient and family aware of discharge plans.  Nonnie Done, [place] [phone]  Psychiatric & Orthopedics (5N 1-16) Clinical Social Worker

## 2014-07-16 ENCOUNTER — Ambulatory Visit: Payer: Self-pay | Admitting: Family Medicine

## 2014-07-18 ENCOUNTER — Ambulatory Visit: Payer: Medicare Other | Admitting: Family Medicine

## 2014-08-27 ENCOUNTER — Other Ambulatory Visit: Payer: Self-pay

## 2014-08-27 DIAGNOSIS — G8929 Other chronic pain: Secondary | ICD-10-CM

## 2014-08-27 DIAGNOSIS — G4700 Insomnia, unspecified: Secondary | ICD-10-CM

## 2014-08-27 MED ORDER — AMITRIPTYLINE HCL 50 MG PO TABS: 50.0000 mg | ORAL_TABLET | Freq: Every day | ORAL | Status: DC

## 2014-09-04 ENCOUNTER — Ambulatory Visit: Payer: Self-pay | Admitting: Gynecologic Oncology

## 2014-09-09 ENCOUNTER — Other Ambulatory Visit: Payer: Self-pay | Admitting: Family Medicine

## 2014-09-09 DIAGNOSIS — G8929 Other chronic pain: Secondary | ICD-10-CM

## 2014-09-09 MED ORDER — GABAPENTIN 300 MG PO CAPS: 300.0000 mg | ORAL_CAPSULE | Freq: Every day | ORAL | Status: DC

## 2014-09-09 NOTE — Telephone Encounter (Signed)
rx sent into pharmacy and patient aware.

## 2014-09-30 ENCOUNTER — Encounter: Payer: Self-pay | Admitting: Family Medicine

## 2014-09-30 ENCOUNTER — Ambulatory Visit (INDEPENDENT_AMBULATORY_CARE_PROVIDER_SITE_OTHER): Payer: Medicare Other | Admitting: Family Medicine

## 2014-09-30 VITALS — BP 111/70 | HR 60 | Temp 96.8°F | Ht 60.0 in | Wt 188.0 lb

## 2014-09-30 DIAGNOSIS — I10 Essential (primary) hypertension: Secondary | ICD-10-CM

## 2014-09-30 DIAGNOSIS — G4700 Insomnia, unspecified: Secondary | ICD-10-CM | POA: Diagnosis not present

## 2014-09-30 DIAGNOSIS — E785 Hyperlipidemia, unspecified: Secondary | ICD-10-CM | POA: Diagnosis not present

## 2014-09-30 DIAGNOSIS — M1612 Unilateral primary osteoarthritis, left hip: Secondary | ICD-10-CM | POA: Diagnosis not present

## 2014-09-30 DIAGNOSIS — G8929 Other chronic pain: Secondary | ICD-10-CM

## 2014-09-30 DIAGNOSIS — Z23 Encounter for immunization: Secondary | ICD-10-CM

## 2014-09-30 DIAGNOSIS — R300 Dysuria: Secondary | ICD-10-CM

## 2014-09-30 LAB — POCT URINALYSIS DIPSTICK
Bilirubin, UA: NEGATIVE
Blood, UA: NEGATIVE
GLUCOSE UA: NEGATIVE
Ketones, UA: NEGATIVE
NITRITE UA: NEGATIVE
Protein, UA: NEGATIVE
Spec Grav, UA: 1.02
UROBILINOGEN UA: NEGATIVE
pH, UA: 6

## 2014-09-30 LAB — POCT UA - MICROSCOPIC ONLY
Bacteria, U Microscopic: NEGATIVE
CASTS, UR, LPF, POC: NEGATIVE
CRYSTALS, UR, HPF, POC: NEGATIVE
Mucus, UA: NEGATIVE
RBC, URINE, MICROSCOPIC: NEGATIVE
YEAST UA: NEGATIVE

## 2014-09-30 MED ORDER — HYDROCHLOROTHIAZIDE 12.5 MG PO CAPS: 12.5000 mg | ORAL_CAPSULE | Freq: Every day | ORAL | Status: DC

## 2014-09-30 MED ORDER — AMITRIPTYLINE HCL 50 MG PO TABS: 50.0000 mg | ORAL_TABLET | Freq: Every day | ORAL | Status: DC

## 2014-09-30 MED ORDER — GABAPENTIN 300 MG PO CAPS: 300.0000 mg | ORAL_CAPSULE | Freq: Every day | ORAL | Status: DC

## 2014-09-30 NOTE — Progress Notes (Signed)
Subjective:    Patient ID: [name], female    DOB: Jul 22, 1945, 69 y.o.   MRN: [id number]  HPI  69 year old female who is here to follow-up blood pressure, chronic back pain, and peripheral neuropathy. She recently had her second hip replacement. She has also had knee replacement. She went to skilled nursing for a while but did muscle her therapy at home. She is getting along well she has 17 steps that she goes up and down multiple times a day without difficulty. She has been seeing a pain clinic in [place] for chronic back pain and wishes to transfer that care here. She takes Norco for pain. She does have some constipation and I suggested a trial of senna S with titration  Blood pressures have been doing well on lisinopril and hydrochlorothiazide  Patient Active Problem List   Diagnosis Date Noted  . OA (osteoarthritis) of hip 07/04/2014  . Endometrial ca 08/28/2013  . Colon polyp, hyperplastic 02/21/2013  . 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 Contusion shoulder/arm 02/21/2013  . Pseudoarthrosis L3- L5 12/17/2012  . Nocturnal leg cramps 11/13/2012  . HLD (hyperlipidemia) 08/10/2012  . Recurrent ventral incisional hernia s/p lap repair 04/23/2013 04/27/2012  . Obesity (BMI 30-39.9) 04/27/2012  . Chronic pain 04/27/2012  . Hypokalemia 04/27/2012  . Hypertension   . History of endometrial cancer s/p TAH BSO June2013 06/15/2011   Outpatient Encounter Prescriptions as of 09/30/2014  Medication Sig  . amitriptyline (ELAVIL) 50 MG tablet Take 1 tablet (50 mg total) by mouth at bedtime.  . docusate sodium (COLACE) 100 MG capsule Take 100 mg by mouth at bedtime.  . gabapentin (NEURONTIN) 300 MG capsule Take 1 capsule (300 mg total) by mouth at bedtime.  Marland Kitchen HYDROcodone-acetaminophen (NORCO) 10-325 MG per tablet Take 1 tablet by mouth 3 (three) times daily.  Marland Kitchen lisinopril-hydrochlorothiazide (PRINZIDE,ZESTORETIC) 20-12.5 MG per tablet     . metroNIDAZOLE (METROGEL) 1 % gel Apply topically daily.  Marland Kitchen nystatin cream (MYCOSTATIN) Apply 1 application topically 2 (two) times daily.  . [DISCONTINUED] acetaminophen (TYLENOL) 325 MG tablet Take 2 tablets (650 mg total) by mouth every 6 (six) hours as needed for mild pain (or Fever >/= 101).  . [DISCONTINUED] bisacodyl (DULCOLAX) 10 MG suppository Place 1 suppository (10 mg total) rectally daily as needed for moderate constipation.  . [DISCONTINUED] ciprofloxacin (CIPRO) 500 MG tablet Take 1 tablet (500 mg total) by mouth 2 (two) times daily. Take twice a day for three days  . [DISCONTINUED] methocarbamol (ROBAXIN) 500 MG tablet Take 1 tablet (500 mg total) by mouth every 6 (six) hours as needed for muscle spasms.  . [DISCONTINUED] metoCLOPramide (REGLAN) 5 MG tablet Take 1 tablet (5 mg total) by mouth every 8 (eight) hours as needed for nausea (if ondansetron (ZOFRAN) ineffective.).  . [DISCONTINUED] nitrofurantoin (MACRODANTIN) 50 MG capsule Take 50 mg by mouth 4 (four) times daily.  . [DISCONTINUED] oxyCODONE (OXY IR/ROXICODONE) 5 MG immediate release tablet Take 1-2 tablets (5-10 mg total) by mouth every 3 (three) hours as needed for moderate pain, severe pain or breakthrough pain.  . [DISCONTINUED] polyethylene glycol (MIRALAX / GLYCOLAX) packet Take 17 g by mouth daily as needed for mild constipation.  . [DISCONTINUED] promethazine (PHENERGAN) 25 MG tablet Take 1 tablet (25 mg total) by mouth every 6 (six) hours as needed for nausea.  . [DISCONTINUED] rivaroxaban (XARELTO) 10 MG TABS tablet Take 1 tablet (10 mg total) by mouth daily with breakfast. Take  Xarelto for two and a half more weeks, then discontinue Xarelto. Once the patient has completed the blood thinner regimen, then take a Baby 81 mg Aspirin daily for three more weeks./  . [DISCONTINUED] traMADol (ULTRAM) 50 MG tablet Take 1-2 tablets (50-100 mg total) by mouth every 6 (six) hours as needed (mild pain).   No  facility-administered encounter medications on file as of 09/30/2014.      Review of Systems  Constitutional: Negative.   HENT: Negative.   Eyes: Negative.   Respiratory: Negative.   Genitourinary: Negative.   Psychiatric/Behavioral: Negative.        Objective:   Physical Exam  Constitutional: She is oriented to person, place, and time. She appears well-developed and well-nourished.  Cardiovascular: Normal rate and regular rhythm.   Pulmonary/Chest: Effort normal and breath sounds normal.  Neurological: She is alert and oriented to person, place, and time.  Psychiatric: She has a normal mood and affect.     BP 111/70 mmHg  Pulse 60  Temp(Src) 96.8 F (36 C) (Oral)  Ht 5' (1.524 m)  Wt 188 lb (85.276 kg)  BMI 36.72 kg/m2      Assessment & Plan:  1. Dysuria Urinalysis few white cells today will be cultured - POCT UA - Microscopic Only - POCT urinalysis dipstick - Urine culture  2. Essential hypertension Blood pressure well controlled on lisinopril hydrochlorothiazide  3. HLD (hyperlipidemia) Patient takes fish oil for triglycerides. Reviewed her other lipid parameters today - Lipid panel  4. Primary osteoarthritis of left hip Managed by orthopedic surgeons  5. Chronic pain Will continue refilling Neurontin and Norco as she transfers care from pain clinic here - amitriptyline (ELAVIL) 50 MG tablet; Take 1 tablet (50 mg total) by mouth at bedtime.  Dispense: 30 tablet; Refill: 1 - gabapentin (NEURONTIN) 300 MG capsule; Take 1 capsule (300 mg total) by mouth at bedtime.  Dispense: 90 capsule; Refill: 1  6. Insomnia Amitriptyline works well for insomnia she sleeps all night without waking up - amitriptyline (ELAVIL) 50 MG tablet; Take 1 tablet (50 mg total) by mouth at bedtime.  Dispense: 30 tablet; Refill: 1

## 2014-09-30 NOTE — Patient Instructions (Signed)

## 2014-10-01 LAB — LIPID PANEL
Chol/HDL Ratio: 2.6 ratio units (ref 0.0–4.4)
Cholesterol, Total: 190 mg/dL (ref 100–199)
HDL: 73 mg/dL (ref >39)
LDL CALC: 93 mg/dL (ref 0–99)
Triglycerides: 121 mg/dL (ref 0–149)
VLDL CHOLESTEROL CAL: 24 mg/dL (ref 5–40)

## 2014-10-01 LAB — URINE CULTURE

## 2014-10-07 ENCOUNTER — Encounter: Payer: Self-pay | Admitting: Gynecologic Oncology

## 2014-10-07 ENCOUNTER — Ambulatory Visit: Payer: Medicare Other | Attending: Gynecologic Oncology | Admitting: Gynecologic Oncology

## 2014-10-07 ENCOUNTER — Telehealth: Payer: Self-pay | Admitting: Family Medicine

## 2014-10-07 VITALS — BP 128/52 | HR 67 | Temp 97.6°F | Resp 16 | Ht 60.0 in | Wt 187.0 lb

## 2014-10-07 DIAGNOSIS — Z08 Encounter for follow-up examination after completed treatment for malignant neoplasm: Secondary | ICD-10-CM | POA: Insufficient documentation

## 2014-10-07 DIAGNOSIS — Z90710 Acquired absence of both cervix and uterus: Secondary | ICD-10-CM | POA: Insufficient documentation

## 2014-10-07 DIAGNOSIS — Z90722 Acquired absence of ovaries, bilateral: Secondary | ICD-10-CM | POA: Insufficient documentation

## 2014-10-07 DIAGNOSIS — Z8542 Personal history of malignant neoplasm of other parts of uterus: Secondary | ICD-10-CM | POA: Insufficient documentation

## 2014-10-07 DIAGNOSIS — Z96643 Presence of artificial hip joint, bilateral: Secondary | ICD-10-CM | POA: Insufficient documentation

## 2014-10-07 DIAGNOSIS — C541 Malignant neoplasm of endometrium: Secondary | ICD-10-CM

## 2014-10-07 NOTE — Telephone Encounter (Signed)
Pt aware that Sabra Heck has not addressed labs at this time.

## 2014-10-07 NOTE — Progress Notes (Signed)
Follow Up Note: Gyn-Onc  [name] 69 y.o. female  CC:  Chief Complaint  Patient presents with  . endometrial cancer    HPI:  [name] is a very pleasant 69 year old who initially presented with postmenopausal bleeding. She was seen by Dr. Evie Lacks and an endometrial biopsy was performed that revealed a grade 1 endometrioid adenocarcinoma. She was referred to GYN Oncology.  After discussion of risks and benefits, she opted for surgery and on November 02, 2010 underwent diagnostic laparoscopy conversion to TAH-BSO, lysis of adhesions for 20 minutes, and incisional hernia repair.   OPERATIVE FINDINGS: Included significant adhesive disease of the omentum and anterior abdominal wall, small bowel to the anterior abdominal wall, and a globular fibroid uterus. She had a 3-cm incisional hernia. Frozen section revealed minimal myometrial invasion. Fortunately, final pathology also revealed minimal myometrial invasion. She had a grade 1 endometrioid adenocarcinoma with focal superficial myometrial invasion of 0.1 cm with a myometrium of 1.8 cm or approximately 7%. She had a 5-cm tumor. No lymphovascular space Involvement.   04/23/2012 she underwent a laparoscopic lysis of adhesions for 45 minutes and repair of a recurrent incisional hernia with mesh. She's overall doing very well. Her colonoscopy is not due to the age of 69. Her primary physician is Dr. Redge Gainer.   We last saw her 02/27/14. Exam at that time was negative for cancer recurrence and her Pap smear was negative  Interval History:   She had a left total hip replacement in February 2016. She did very well from the surgery. She did have to go to a nursing home which she promptly left after 3 days secondary to multiple issues. She was able to do her physical therapy at home. She was due for a mammogram in May but pushed it back until June. She is not due for  colonoscopy to the age of 69. She did lose about 8 pounds since we last saw her in October. She attributes this to unintentional weight loss in desire to "cut back". She is looking forward to 2 trips that are coming up. She's continued to [facility] to listen to music and she is going to [facility] with the senior's group to see shows.  Review of Systems  Constitutional: Denies fever. Intentional weight los Skin: No rash, sores, jaundice, itching, or dryness.  Cardiovascular: No chest pain, shortness of breath, or edema  Pulmonary: No cough or wheeze.  Gastro Intestinal:  No nausea, vomiting, constipation, or diarrhea reported. No bright red blood per rectum or change in bowel movement.  Genitourinary: No frequency, urgency, or dysuria. + urinary incontinence with activity. Denies vaginal bleeding and discharge.  Musculoskeletal: Markedly decreased hip pain Neurologic: No weakness, numbness, or change in gait.  Psychology: No changes.   Current Meds:  Outpatient Encounter Prescriptions as of 10/07/2014  Medication Sig  . amitriptyline (ELAVIL) 50 MG tablet Take 1 tablet (50 mg total) by mouth at bedtime.  . docusate sodium (COLACE) 100 MG capsule Take 100 mg by mouth at bedtime.  . gabapentin (NEURONTIN) 300 MG capsule Take 1 capsule (300 mg total) by mouth at bedtime.  . hydrochlorothiazide (MICROZIDE) 12.5 MG capsule Take 1 capsule (12.5 mg total) by mouth daily.  Marland Kitchen HYDROcodone-acetaminophen (NORCO) 10-325 MG per tablet Take 1 tablet by mouth 3 (three) times daily.  Marland Kitchen lisinopril (PRINIVIL,ZESTRIL) 20 MG tablet Take 20 mg by mouth daily.  . metroNIDAZOLE (METROGEL) 1 % gel Apply topically daily.  Marland Kitchen nystatin cream (MYCOSTATIN) Apply 1  application topically 2 (two) times daily.  . [DISCONTINUED] HYDROcodone-acetaminophen (NORCO) 10-325 MG per tablet Take 1 tablet by mouth 3 (three) times daily.   No facility-administered encounter medications on file as of 10/07/2014.    Allergy:   Allergies  Allergen Reactions  . Cashew Nut Oil Hives  . Sulfamethoxazole     Mouth sores  . Zofran Hives and Nausea Only    Social Hx:   History   Social History  . Marital Status: Widowed    Spouse Name: N/A  . Number of Children: N/A  . Years of Education: N/A   Occupational History  . Not on file.   Social History Main Topics  . Smoking status: Never Smoker   . Smokeless tobacco: Not on file  . Alcohol Use: No  . Drug Use: No  . Sexual Activity: No   Other Topics Concern  . Not on file   Social History Narrative    Past Surgical Hx:  Past Surgical History  Procedure Laterality Date  . Abdominal hysterectomy  11/02/10    TAH, BSO, lysis of adhesions for endometrial cancer  . Incisional hernia repair  11/02/10  . Back surgery  1989, 1999, 2009    anterior L2-3, L3-4 arthrodesis  . Kidney surgery  2010    Left kidney  . Bladder suspension  2003  . Foot surgery  06/2010    left foot surgery  . Joint replacement  09/2009    Right total knee  . Total hip arthroplasty  01/2010    Right total hip  . Cataract extraction, bilateral  2003  . Total knee arthroplasty      right  . Ureterolysis  2010    lap/open left ureterolysis & omental flap, ureter stent  . Ventral hernia repair N/A 07/19/2012    Procedure: LAPAROSCOPIC LYSIS OF ADHESIONS, SMALL BOWEL RESECTION, SEROSAL REPAIR, PRIMARY VENTRAL HERNIA REPAIR;  Surgeon: Adin Hector, MD;  Location: [facility];  Service: General;  Laterality: N/A;  . Hernia repair  07/19/12    lap [place] repair  . Eye surgery    . Back surgery  12/17/2012    Spinal fusion and repair   . Ventral hernia repair N/A 04/23/2013    Procedure: LAPAROSCOPIC exploration and repair of hernia in abdominal VENTRAL wall  HERNIA;  Surgeon: Adin Hector, MD;  Location: [facility];  Service: General;  Laterality: N/A;  . Insertion of mesh N/A 04/23/2013    Procedure: INSERTION OF MESH;  Surgeon: Adin Hector, MD;  Location: [facility];  Service: General;   Laterality: N/A;  . Laparoscopic lysis of adhesions N/A 04/23/2013    Procedure: LAPAROSCOPIC LYSIS OF ADHESIONS;  Surgeon: Adin Hector, MD;  Location: [facility];  Service: General;  Laterality: N/A;  . Total hip arthroplasty Left 07/04/2014    Procedure: LEFT TOTAL HIP ARTHROPLASTY ANTERIOR APPROACH;  Surgeon: Gearlean Alf, MD;  Location: [facility];  Service: Orthopedics;  Laterality: Left;    Past Medical Hx:  Past Medical History  Diagnosis Date  . Endometrioid adenocarcinoma   . Hypertension   . Obesity (BMI 30-39.9) 04/27/2012  . Arthritis   . Hyperlipidemia   . Ureteral adhesion, left 2010    s/p ureterolysis & stenting Dr. Larwance Sachs now  . 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 Colon polyp, hyperplastic 04/27/2012    Colonoscopy 2010.  No adenomatous polyps   . History of endometrial cancer s/p TAH BSO  [id number] 06/15/2011    Grade 1 endometrioid adenocarcinoma with focal superficial myometrial invasion of 0.1 cm with a myometrium of 1.8 cm or approximately 7%. She had a 5-cm tumor. No lymphovascular space Involvement.    . Mass of in fold of jejunum s/p SB resection 07/19/2012 07/20/2012  . UTI (urinary tract infection)   . SBO (small bowel obstruction) - recurrent 04/27/2012    Family Hx:  Family History  Problem Relation Age of Onset  . Diabetes Mother   . Cancer Mother     lung  . Diabetes Father   . Cancer Father     liver  . Colon cancer Sister   . Diabetes Sister   . Cancer Sister     colon    Vitals:  Blood pressure 128/52, pulse 67, temperature 97.6 F (36.4 C), temperature source Oral, resp. rate 16, height 5' (1.524 m), weight 187 lb (84.823 kg).  Physical Exam:  General: Well developed, well nourished female in no acute distress. Alert and oriented x 3.    Lymph node survey: No cervical, supraclavicular, or inguinal adenopathy.   Cardiovascular: Regular rate and rhythm.  Lungs: Clear to  auscultation bilaterally.   Skin: No rashes or lesions present.   Back: No CVA tenderness.   Abdomen: Abdomen soft, non-tender and obese.  Active bowel sounds in all quadrants. No evidence of a fluid wave or abdominal masses.  No distension, tenderness, rigidity, or guarding. No appreciable hernias.  Genitourinary:    Vulva/vagina: Normal external female genitalia. No lesions.    Urethra: No lesions or masses    Vagina: Atrophic without any lesions. No palpable masses. No vaginal bleeding or drainage noted.  Rectal: Good tone, no masses no cul de sac nodularity.   Extremities: No bilateral cyanosis or clubbing.   Well-healed incision over left hip  Assessment/Plan:  [name] is a 69 year old with stage IA, grade 1 endometrioid adenocarcinoma, who requires no postoperative adjuvant therapy.  She has been without evidence of recurrence since 2012.   She is to follow up with [place] in 6 months. We will perform a Pap smear when she returns to see me.  [name] A., MD 10/07/2014, 1:42 PM

## 2014-10-07 NOTE — Patient Instructions (Addendum)
Follow-up with Dr. Alycia Rossetti in 6 months. Please call us sooner if you have any questions or concerns.

## 2014-10-09 ENCOUNTER — Ambulatory Visit: Payer: Medicare Other | Admitting: Gynecologic Oncology

## 2014-10-29 ENCOUNTER — Telehealth: Payer: Self-pay | Admitting: Family Medicine

## 2014-10-29 ENCOUNTER — Ambulatory Visit (INDEPENDENT_AMBULATORY_CARE_PROVIDER_SITE_OTHER): Payer: Medicare Other | Admitting: Family Medicine

## 2014-10-29 ENCOUNTER — Encounter: Payer: Self-pay | Admitting: Family Medicine

## 2014-10-29 VITALS — BP 139/73 | HR 65 | Temp 97.8°F | Ht 60.0 in | Wt 189.8 lb

## 2014-10-29 DIAGNOSIS — J329 Chronic sinusitis, unspecified: Secondary | ICD-10-CM

## 2014-10-29 DIAGNOSIS — G8929 Other chronic pain: Secondary | ICD-10-CM

## 2014-10-29 DIAGNOSIS — J40 Bronchitis, not specified as acute or chronic: Secondary | ICD-10-CM

## 2014-10-29 DIAGNOSIS — M1610 Unilateral primary osteoarthritis, unspecified hip: Secondary | ICD-10-CM

## 2014-10-29 DIAGNOSIS — G4700 Insomnia, unspecified: Secondary | ICD-10-CM

## 2014-10-29 MED ORDER — AMITRIPTYLINE HCL 50 MG PO TABS: 50.0000 mg | ORAL_TABLET | Freq: Every day | ORAL | Status: DC

## 2014-10-29 MED ORDER — BENZONATATE 200 MG PO CAPS: 200.0000 mg | ORAL_CAPSULE | Freq: Three times a day (TID) | ORAL | Status: DC | PRN

## 2014-10-29 MED ORDER — AMOXICILLIN-POT CLAVULANATE 875-125 MG PO TABS: 1.0000 | ORAL_TABLET | Freq: Two times a day (BID) | ORAL | Status: DC

## 2014-10-29 MED ORDER — MELOXICAM 15 MG PO TABS: 15.0000 mg | ORAL_TABLET | Freq: Every day | ORAL | Status: DC

## 2014-10-29 NOTE — Telephone Encounter (Signed)
Patient notified amitriptyline 90 day supply sent to pharmacy.

## 2014-10-29 NOTE — Progress Notes (Signed)
Subjective:  Patient ID: [name], female    DOB: 26-Feb-1946  Age: 69 y.o. MRN: [id number]  CC: URI   HPI [name] presents for 10 days of increasing cough. She's had upper respiratory congestion and purulent rhinorrhea as well. Some frontal discomfort. Ears have not had any pain or pressure. Hearing is intact. She's had some sore throat and posterior drainage. She has had a productive cough with purulent sputum. She denies shortness of breath and chest tightness.  Patient suffers from arthritis. She also has recently had hip surgery. She is waiting to have a prosthesis made due to leg length discrepancy. She states that the left leg used to be longer now the right leg is longer after surgery. She takes Norco twice daily given to her by the pain clinic from [employer]. This is due to back pain brought on by her leg length discrepancy. She also has arthritis pain. She is taking Modicon the past not sure why she was taken off of it. She would like to restart that medicine.  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   She has past surgical history that includes Abdominal hysterectomy (11/02/10); Incisional hernia repair (11/02/10); Back surgery (1989, 1999, 2009); Kidney surgery (2010); Bladder suspension (2003); Foot surgery (06/2010); Joint replacement (09/2009); Total hip arthroplasty (01/2010); Cataract extraction, bilateral (2003); Total knee arthroplasty; Ureterolysis (2010); Ventral hernia repair (N/A,  07/19/2012); Hernia repair (07/19/12); Eye surgery; Back surgery (12/17/2012); Ventral hernia repair (N/A, 04/23/2013); Insertion of mesh (N/A, 04/23/2013); Laparoscopic lysis of adhesions (N/A, 04/23/2013); and Total hip arthroplasty (Left, 07/04/2014).   Her family history includes Cancer in her father, mother, and sister; Colon cancer in her sister; Diabetes in her father, mother, and sister.She reports that she has never smoked. She does not have any smokeless tobacco history on file. She reports that she does not drink alcohol or use illicit drugs.  Outpatient Prescriptions Prior to Visit  Medication Sig Dispense Refill  . amitriptyline (ELAVIL) 50 MG tablet Take 1 tablet (50 mg total) by mouth at bedtime. 30 tablet 1  . docusate sodium (COLACE) 100 MG capsule Take 100 mg by mouth at bedtime.    . gabapentin (NEURONTIN) 300 MG capsule Take 1 capsule (300 mg total) by mouth at bedtime. 90 capsule 1  . hydrochlorothiazide (MICROZIDE) 12.5 MG capsule Take 1 capsule (12.5 mg total) by mouth daily. 90 capsule 0  . HYDROcodone-acetaminophen (NORCO) 10-325 MG per tablet Take 1 tablet by mouth 3 (three) times daily.    Marland Kitchen lisinopril (PRINIVIL,ZESTRIL) 20 MG tablet Take 20 mg by mouth daily.    . metroNIDAZOLE (METROGEL) 1 % gel Apply topically daily. 45 g 0  . nystatin cream (MYCOSTATIN) Apply 1 application topically 2 (two) times daily. 60 g 6   No facility-administered medications prior to visit.    ROS Review of Systems  Constitutional: Negative for fever, chills, activity change and appetite change.  HENT: Positive for congestion, postnasal drip, rhinorrhea and sinus pressure. Negative for ear discharge, ear pain, hearing loss, nosebleeds, sneezing and trouble swallowing.   Respiratory: Negative for chest tightness and shortness of breath.   Cardiovascular:  Negative for chest pain and palpitations.  Skin: Negative for rash.    Objective:  BP 139/73 mmHg  Pulse 65  Temp(Src) 97.8 F (36.6  C) (Oral)  Ht 5' (1.524 m)  Wt 189 lb 12.8 oz (86.093 kg)  BMI 37.07 kg/m2  BP Readings from Last 3 Encounters:  10/29/14 139/73  10/07/14 128/52  09/30/14 111/70    Wt Readings from Last 3 Encounters:  10/29/14 189 lb 12.8 oz (86.093 kg)  10/07/14 187 lb (84.823 kg)  09/30/14 188 lb (85.276 kg)     Physical Exam  Constitutional: She appears well-developed and well-nourished.  HENT:  Head: Normocephalic and atraumatic.  Right Ear: Tympanic membrane and external ear normal. No decreased hearing is noted.  Left Ear: Tympanic membrane and external ear normal. No decreased hearing is noted.  Nose: Mucosal edema present. Right sinus exhibits no frontal sinus tenderness. Left sinus exhibits no frontal sinus tenderness.  Mouth/Throat: No oropharyngeal exudate or posterior oropharyngeal erythema.  Neck: No Brudzinski's sign noted.  Pulmonary/Chest: Breath sounds normal. No respiratory distress.  Lymphadenopathy:       Head (right side): No preauricular adenopathy present.       Head (left side): No preauricular adenopathy present.       Right cervical: No superficial cervical adenopathy present.      Left cervical: No superficial cervical adenopathy present.    Lab Results  Component Value Date   HGBA1C 5.6 08/08/2013   HGBA1C 5.5% 03/08/2013   HGBA1C 6.6* 11/04/2010    Lab Results  Component Value Date   WBC 12.7* 07/07/2014   HGB 11.0* 07/07/2014   HCT 33.0* 07/07/2014   PLT 243 07/07/2014   GLUCOSE 123* 07/06/2014   CHOL 190 09/30/2014   TRIG 121 09/30/2014   HDL 73 09/30/2014   LDLCALC 93 09/30/2014   ALT 15 06/23/2014   AST 20 06/23/2014   NA 139 07/06/2014   K 4.1 07/06/2014   CL 105 07/06/2014   CREATININE 0.63 07/06/2014   BUN 10 07/06/2014   CO2 26 07/06/2014   INR 1.05 06/23/2014   HGBA1C 5.6 08/08/2013    Dg Chest 2 View  06/23/2014   CLINICAL DATA:  Left hip arthroplasty.  Hypertension.  EXAM: CHEST  2 VIEW  COMPARISON:  None.  FINDINGS:  Mediastinum and hilar structures normal. Left basilar segment atelectasis and/or infiltrate. No pleural effusion or pneumothorax. No acute bony abnormality. Surgical screw in of the lumbar spine.  IMPRESSION: Left base subsegmental atelectasis versus infiltrate.   Electronically Signed   By: Marcello Moores  Register   On: 06/23/2014 12:10    Assessment & Plan:   Smantha was seen today for uri.  Diagnoses and all orders for this visit:  Sinobronchitis  Chronic pain  Primary osteoarthritis of hip, unspecified laterality  Other orders -     amoxicillin-clavulanate (AUGMENTIN) 875-125 MG per tablet; Take 1 tablet by mouth 2 (two) times daily. Take all of this medication -     benzonatate (TESSALON) 200 MG capsule; Take 1 capsule (200 mg total) by mouth 3 (three) times daily as needed for cough. -     meloxicam (MOBIC) 15 MG tablet; Take 1 tablet (15 mg total) by mouth daily.  I am having [name] start on amoxicillin-clavulanate, benzonatate, and meloxicam. I am also having her maintain her docusate sodium, metroNIDAZOLE, nystatin cream, lisinopril, amitriptyline, gabapentin, hydrochlorothiazide, and HYDROcodone-acetaminophen.  Meds ordered this encounter  Medications  . amoxicillin-clavulanate (AUGMENTIN) 875-125 MG per tablet  Sig: Take 1 tablet by mouth 2 (two) times daily. Take all of this medication    Dispense:  20 tablet    Refill:  0  . benzonatate (TESSALON) 200 MG capsule    Sig: Take 1 capsule (200 mg total) by mouth 3 (three) times daily as needed for cough.    Dispense:  20 capsule    Refill:  0  . meloxicam (MOBIC) 15 MG tablet    Sig: Take 1 tablet (15 mg total) by mouth daily.    Dispense:  30 tablet    Refill:  2     Follow-up: No Follow-up on file.  Claretta Fraise, M.D.

## 2014-11-11 ENCOUNTER — Encounter: Payer: Self-pay | Admitting: Gastroenterology

## 2014-11-17 ENCOUNTER — Other Ambulatory Visit: Payer: Self-pay | Admitting: Family Medicine

## 2014-11-17 DIAGNOSIS — N6320 Unspecified lump in the left breast, unspecified quadrant: Secondary | ICD-10-CM

## 2014-11-25 ENCOUNTER — Other Ambulatory Visit: Payer: Medicare Other

## 2014-12-02 ENCOUNTER — Encounter: Payer: Self-pay | Admitting: *Deleted

## 2015-01-06 ENCOUNTER — Ambulatory Visit (INDEPENDENT_AMBULATORY_CARE_PROVIDER_SITE_OTHER): Payer: Medicare Other | Admitting: Family Medicine

## 2015-01-06 ENCOUNTER — Encounter: Payer: Self-pay | Admitting: Family Medicine

## 2015-01-06 VITALS — BP 117/66 | HR 59 | Temp 97.5°F | Ht 60.0 in | Wt 192.0 lb

## 2015-01-06 DIAGNOSIS — I10 Essential (primary) hypertension: Secondary | ICD-10-CM

## 2015-01-06 DIAGNOSIS — G4700 Insomnia, unspecified: Secondary | ICD-10-CM

## 2015-01-06 DIAGNOSIS — M1610 Unilateral primary osteoarthritis, unspecified hip: Secondary | ICD-10-CM

## 2015-01-06 DIAGNOSIS — G8929 Other chronic pain: Secondary | ICD-10-CM | POA: Diagnosis not present

## 2015-01-06 DIAGNOSIS — E785 Hyperlipidemia, unspecified: Secondary | ICD-10-CM | POA: Diagnosis not present

## 2015-01-06 MED ORDER — AMITRIPTYLINE HCL 25 MG PO TABS
25.0000 mg | ORAL_TABLET | Freq: Every day | ORAL | Status: DC
Start: 2015-01-06 — End: 2015-03-31

## 2015-01-06 NOTE — Progress Notes (Signed)
Subjective:    Patient ID: [name], female    DOB: Aug 23, 1945, 69 y.o.   MRN: [id number]  HPI  69 year old female with chronic back pain who takes hydrocodone for her symptoms. She was recently treated here for sinus infection and still has some residual dizziness but basically the sinuses are clear. She has been taking amitriptyline 50 mg at night but feels drowsy the next day and ask if we could cut that back to 25 mg and I am in agreement.    Review of Systems  Constitutional: Negative.   Respiratory: Negative.   Cardiovascular: Negative.   Gastrointestinal: Negative.   Musculoskeletal: Positive for back pain.  Neurological: Positive for dizziness.       Patient Active Problem List   Diagnosis Date Noted  . OA (osteoarthritis) of hip 07/04/2014  . 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 Pseudoarthrosis L3- L5 12/17/2012  . Nocturnal leg cramps 11/13/2012  . HLD (hyperlipidemia) 08/10/2012  . Recurrent ventral incisional hernia s/p lap repair 04/23/2013 04/27/2012  . Obesity (BMI 30-39.9) 04/27/2012  . Chronic pain 04/27/2012  . Hypokalemia 04/27/2012  . Hypertension   . History of endometrial cancer s/p TAH BSO June2013 06/15/2011   Outpatient Encounter Prescriptions as of 01/06/2015  Medication Sig  . amitriptyline (ELAVIL) 25 MG tablet Take 1 tablet (25 mg total) by mouth at bedtime.  . docusate sodium (COLACE) 100 MG capsule Take 100 mg by mouth at bedtime.  . gabapentin (NEURONTIN) 300 MG capsule Take 1 capsule (300 mg total) by mouth at bedtime.  . hydrochlorothiazide (MICROZIDE) 12.5 MG capsule Take 1 capsule (12.5 mg total) by mouth daily.  Marland Kitchen HYDROcodone-acetaminophen (NORCO) 10-325 MG per tablet Take 1 tablet by mouth 3 (three) times daily.  Marland Kitchen lisinopril (PRINIVIL,ZESTRIL) 20 MG tablet Take 20 mg by mouth daily.  . meloxicam (MOBIC) 15 MG tablet Take 1 tablet (15 mg total) by mouth daily.  . metroNIDAZOLE  (METROGEL) 1 % gel Apply topically daily.  Marland Kitchen nystatin cream (MYCOSTATIN) Apply 1 application topically 2 (two) times daily.  . [DISCONTINUED] amitriptyline (ELAVIL) 50 MG tablet Take 1 tablet (50 mg total) by mouth at bedtime.  . [DISCONTINUED] amoxicillin-clavulanate (AUGMENTIN) 875-125 MG per tablet Take 1 tablet by mouth 2 (two) times daily. Take all of this medication  . [DISCONTINUED] benzonatate (TESSALON) 200 MG capsule Take 1 capsule (200 mg total) by mouth 3 (three) times daily as needed for cough.   No facility-administered encounter medications on file as of 01/06/2015.    Objective:   Physical Exam  Constitutional: She is oriented to person, place, and time. She appears well-developed and well-nourished.  HENT:  Right Ear: External ear normal.  Left Ear: External ear normal.  Cardiovascular: Normal rate and regular rhythm.   Pulmonary/Chest: Effort normal and breath sounds normal.  Neurological: She is alert and oriented to person, place, and time.  Psychiatric: She has a normal mood and affect. Her behavior is normal.          Assessment & Plan:  1. Essential hypertension Blood pressure well controlled at 117/66. Continue with hydrochlorothiazide and lisinopril - POCT UA - Microalbumin - BMP8+EGFR  2. HLD (hyperlipidemia) Lipids are at goal when last checked 3 months ago. We'll continue as before  3. Primary osteoarthritis of hip, unspecified laterality She has had low vitamin D levels in the past and questions if she needs to be on higher dose than routine supplement - Vit D  25 hydroxy (rtn osteoporosis monitoring)  4. Chronic pain Symptoms controlled with amitriptyline and hydrocodone - amitriptyline (ELAVIL) 25 MG tablet; Take 1 tablet (25 mg total) by mouth at bedtime.  Dispense: 90 tablet; Refill: 0  5. Insomnia Since she's feeling some drowsiness the next day will try to reduce the dose of amitriptyline. It may be that since she has been on it for such a  long time she will need higher dose or consider switching to other medicines such as Ativan  Wardell Honour MD - amitriptyline (ELAVIL) 25 MG tablet; Take 1 tablet (25 mg total) by mouth at bedtime.  Dispense: 90 tablet; Refill: 0

## 2015-01-06 NOTE — Patient Instructions (Signed)
[name]

## 2015-01-07 ENCOUNTER — Ambulatory Visit: Payer: Medicare Other | Admitting: Family Medicine

## 2015-01-07 LAB — BMP8+EGFR
BUN/Creatinine Ratio: 13 (ref 11–26)
BUN: 13 mg/dL (ref 8–27)
CALCIUM: 9.3 mg/dL (ref 8.7–10.3)
CHLORIDE: 99 mmol/L (ref 97–108)
CO2: 26 mmol/L (ref 18–29)
Creatinine, Ser: 0.98 mg/dL (ref 0.57–1.00)
GFR calc Af Amer: 68 mL/min/{1.73_m2} (ref >59)
GFR, EST NON AFRICAN AMERICAN: 59 mL/min/{1.73_m2} — AB (ref >59)
Glucose: 108 mg/dL — ABNORMAL HIGH (ref 65–99)
POTASSIUM: 5 mmol/L (ref 3.5–5.2)
Sodium: 142 mmol/L (ref 134–144)

## 2015-01-07 LAB — VITAMIN D 25 HYDROXY (VIT D DEFICIENCY, FRACTURES): VIT D 25 HYDROXY: 32 ng/mL (ref 30.0–100.0)

## 2015-01-23 ENCOUNTER — Other Ambulatory Visit (HOSPITAL_COMMUNITY): Payer: Self-pay | Admitting: Family Medicine

## 2015-01-23 DIAGNOSIS — N135 Crossing vessel and stricture of ureter without hydronephrosis: Secondary | ICD-10-CM

## 2015-01-31 ENCOUNTER — Other Ambulatory Visit: Payer: Self-pay | Admitting: Family Medicine

## 2015-02-11 ENCOUNTER — Other Ambulatory Visit (HOSPITAL_COMMUNITY): Payer: Self-pay | Admitting: Urology

## 2015-02-11 ENCOUNTER — Other Ambulatory Visit (HOSPITAL_COMMUNITY): Payer: Self-pay

## 2015-02-11 ENCOUNTER — Ambulatory Visit (HOSPITAL_COMMUNITY)
Admission: RE | Admit: 2015-02-11 | Discharge: 2015-02-11 | Disposition: A | Discharge status: Home / Self Care | Payer: Medicare Other | Source: Ambulatory Visit | Attending: Urology | Admitting: Urology

## 2015-02-11 DIAGNOSIS — N135 Crossing vessel and stricture of ureter without hydronephrosis: Secondary | ICD-10-CM

## 2015-02-11 MED ORDER — FUROSEMIDE 10 MG/ML IJ SOLN
44.0000 mg | Freq: Once | INTRAMUSCULAR | Status: AC
  Administered 2015-02-11: 41 mg via INTRAVENOUS

## 2015-02-11 MED ORDER — FUROSEMIDE 10 MG/ML IJ SOLN
INTRAMUSCULAR | Status: AC
  Filled 2015-02-11: qty 4

## 2015-02-11 MED ORDER — TECHNETIUM TC 99M MERTIATIDE
15.9000 | Freq: Once | INTRAVENOUS | Status: AC | PRN
  Administered 2015-02-11: 15.9 via INTRAVENOUS

## 2015-03-31 ENCOUNTER — Other Ambulatory Visit: Payer: Self-pay | Admitting: Family Medicine

## 2015-04-17 ENCOUNTER — Encounter: Payer: Self-pay | Admitting: Pediatrics

## 2015-04-17 ENCOUNTER — Ambulatory Visit (INDEPENDENT_AMBULATORY_CARE_PROVIDER_SITE_OTHER): Payer: Medicare Other | Admitting: Pediatrics

## 2015-04-17 ENCOUNTER — Ambulatory Visit: Payer: Medicare Other | Admitting: Family Medicine

## 2015-04-17 VITALS — BP 112/67 | HR 80 | Temp 97.3°F | Ht 60.0 in | Wt 196.2 lb

## 2015-04-17 DIAGNOSIS — Z1389 Encounter for screening for other disorder: Secondary | ICD-10-CM | POA: Diagnosis not present

## 2015-04-17 DIAGNOSIS — E669 Obesity, unspecified: Secondary | ICD-10-CM | POA: Diagnosis not present

## 2015-04-17 DIAGNOSIS — Z23 Encounter for immunization: Secondary | ICD-10-CM

## 2015-04-17 DIAGNOSIS — Z1331 Encounter for screening for depression: Secondary | ICD-10-CM

## 2015-04-17 DIAGNOSIS — Z1239 Encounter for other screening for malignant neoplasm of breast: Secondary | ICD-10-CM | POA: Diagnosis not present

## 2015-04-17 DIAGNOSIS — Z1159 Encounter for screening for other viral diseases: Secondary | ICD-10-CM | POA: Diagnosis not present

## 2015-04-17 DIAGNOSIS — G8929 Other chronic pain: Secondary | ICD-10-CM

## 2015-04-17 DIAGNOSIS — R1032 Left lower quadrant pain: Secondary | ICD-10-CM | POA: Diagnosis not present

## 2015-04-17 DIAGNOSIS — I10 Essential (primary) hypertension: Secondary | ICD-10-CM

## 2015-04-17 DIAGNOSIS — B372 Candidiasis of skin and nail: Secondary | ICD-10-CM

## 2015-04-17 DIAGNOSIS — D649 Anemia, unspecified: Secondary | ICD-10-CM

## 2015-04-17 DIAGNOSIS — G4700 Insomnia, unspecified: Secondary | ICD-10-CM

## 2015-04-17 LAB — POCT GLYCOSYLATED HEMOGLOBIN (HGB A1C): HEMOGLOBIN A1C: 5.8

## 2015-04-17 MED ORDER — MELOXICAM 15 MG PO TABS: 15.0000 mg | ORAL_TABLET | Freq: Every day | ORAL | Status: DC

## 2015-04-17 MED ORDER — AMITRIPTYLINE HCL 25 MG PO TABS: 25.0000 mg | ORAL_TABLET | Freq: Every day | ORAL | Status: DC

## 2015-04-17 MED ORDER — FLUCONAZOLE 150 MG PO TABS: 150.0000 mg | ORAL_TABLET | ORAL | Status: DC

## 2015-04-17 MED ORDER — LISINOPRIL-HYDROCHLOROTHIAZIDE 20-12.5 MG PO TABS: 1.0000 | ORAL_TABLET | Freq: Every day | ORAL | Status: DC

## 2015-04-17 MED ORDER — GABAPENTIN 300 MG PO CAPS: 300.0000 mg | ORAL_CAPSULE | Freq: Every day | ORAL | Status: DC

## 2015-04-17 NOTE — Progress Notes (Signed)
Subjective:    Patient ID: [name], female    DOB: 08/11/1945, 69 y.o.   MRN: [id number]  CC: Follow-up multiple med problems  HPI: [name] is a 69 y.o. female presenting for Follow-up  Constipation: well controlled, takes senna 8 caps a day  Dr. Eden Emms prescribes pain medicine for OA  Insomnia: sleeping well currently  HTN: compliant with medicine, No headaches or vision changes, no SOB or CP.  Has rash under L breast that she has been using nystatin cream on, no improvement  Has pain that comes and goes in abdomen, mostly LLQ. No pain today.   Due for mammogram  OA limits her activity some, tries to stay active. Using a cane now regularly   Depression screen [facility] 2/9 04/17/2015 01/06/2015 10/29/2014 06/18/2014 03/17/2014  Decreased Interest 0 0 0 0 0  Down, Depressed, Hopeless - 0 0 0 0  PHQ - 2 Score 0 0 0 0 0     Relevant past medical, surgical, family and social history reviewed and updated as indicated. Interim medical history since our last visit reviewed. Allergies and medications reviewed and updated.    ROS: Per HPI unless specifically indicated above  History  Smoking status  . Never Smoker   Smokeless tobacco  . Not on file    Past Medical History Patient Active Problem List   Diagnosis Date Noted  . OA (osteoarthritis) of hip 07/04/2014  . 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 Pseudoarthrosis L3- L5 12/17/2012  . Nocturnal leg cramps 11/13/2012  . HLD (hyperlipidemia) 08/10/2012  . Recurrent ventral incisional hernia s/p lap repair 04/23/2013 04/27/2012  . Obesity (BMI 30-39.9) 04/27/2012  . Chronic pain 04/27/2012  . Hypokalemia 04/27/2012  . Hypertension   . History of endometrial cancer s/p TAH BSO June2013 06/15/2011    Current Outpatient Prescriptions  Medication Sig Dispense Refill  . amitriptyline (ELAVIL) 25 MG tablet Take 1 tablet (25 mg total) by mouth at bedtime.  90 tablet 1  . gabapentin (NEURONTIN) 300 MG capsule Take 1 capsule (300 mg total) by mouth at bedtime. 90 capsule 1  . HYDROcodone-acetaminophen (NORCO) 10-325 MG per tablet Take 1 tablet by mouth 3 (three) times daily.    . meloxicam (MOBIC) 15 MG tablet Take 1 tablet (15 mg total) by mouth daily. 90 tablet 1  . metroNIDAZOLE (METROGEL) 1 % gel Apply topically daily. 45 g 0  . nystatin cream (MYCOSTATIN) Apply 1 application topically 2 (two) times daily. 60 g 6  . docusate sodium (COLACE) 100 MG capsule Take 100 mg by mouth at bedtime.    . fluconazole (DIFLUCAN) 150 MG tablet Take 1 tablet (150 mg total) by mouth every other day. 3 tablet 0  . lisinopril-hydrochlorothiazide (ZESTORETIC) 20-12.5 MG tablet Take 1 tablet by mouth daily. 90 tablet 3   No current facility-administered medications for this visit.       Objective:    BP 112/67 mmHg  Pulse 80  Temp(Src) 97.3 F (36.3 C) (Oral)  Ht 5' (1.524 m)  Wt 196 lb 3.2 oz (88.996 kg)  BMI 38.32 kg/m2  Wt Readings from Last 3 Encounters:  04/17/15 196 lb 3.2 oz (88.996 kg)  02/11/15 192 lb (87.091 kg)  01/06/15 192 lb (87.091 kg)    Gen: NAD, alert, cooperative with exam, NCAT EYES: EOMI, no scleral injection or icterus ENT:  TMs pearly gray b/l, OP without erythema LYMPH: no cervical LAD CV: NRRR,  normal S1/S2, no murmur, distal pulses 2+ b/l Resp: CTABL, no wheezes, normal WOB Abd: +BS, soft, NTND. no guarding or organomegaly. Pannus present Ext: No edema, warm Neuro: Alert and oriented, strength equal b/l UE and LE, coordination grossly normal MSK: normal muscle bulk Skin: Red slightly peeling rash under L breast. No rash in other intertriginous areas     Assessment & Plan:    [name] was seen today for follow-up of multiple medical problems as below.  Diagnoses and all orders for this visit:  Essential hypertension Well controlled today. Continue below medicine. WIll check labs today. -      lisinopril-hydrochlorothiazide (ZESTORETIC) 20-12.5 MG tablet; Take 1 tablet by mouth daily. -     CMP14+EGFR  Chronic pain On chronic narcotic therpay prescribed by pain medicine. Continue below medications. -     meloxicam (MOBIC) 15 MG tablet; Take 1 tablet (15 mg total) by mouth daily. -     gabapentin (NEURONTIN) 300 MG capsule; Take 1 capsule (300 mg total) by mouth at bedtime. -     amitriptyline (ELAVIL) 25 MG tablet; Take 1 tablet (25 mg total) by mouth at bedtime.  Insomnia Amitriptyline has helped her insomnia and pain, continue. No more episodes of being too sleepy in the middle of the night.  Obesity (BMI 30-39.9) Discussed activity as she is able, nutrition goals, minimizing snacks and sugary foods. -     POCT glycosylated hemoglobin (Hb A1C)  Screening for breast cancer due for mammogram -     MM Digital Diagnostic Bilat; Future  Need for hepatitis C screening test -     Hepatitis C antibody  Anemia, unspecified anemia type Has had anemia per chart review, will repeat CBC -     CBC  Encounter for immunization  Candidal skin infection failed nystatin ointment, will do below for 3 doses. -     fluconazole (DIFLUCAN) 150 MG tablet; Take 1 tablet (150 mg total) by mouth every other day.  Depression screen negative  Other orders -     Flu Vaccine QUAD 36+ mos IM  Abdominal pain No pain today. Pain comes and goes, has happened twice. Not very bothersome when it does come on. Possibly due to constipation. Will add docusate, miralax to bowel regimen, try to replace daily senna. Goal for 1-2 soft stools every day. If pain returns will let me know. Return precautions given.    Follow up plan: 3 months  [name], MD [facility] 04/17/2015, 10:21 AM

## 2015-04-18 LAB — CBC
HEMOGLOBIN: 14.8 g/dL (ref 11.1–15.9)
Hematocrit: 42.6 % (ref 34.0–46.6)
MCH: 32 pg (ref 26.6–33.0)
MCHC: 34.7 g/dL (ref 31.5–35.7)
MCV: 92 fL (ref 79–97)
Platelets: 349 10*3/uL (ref 150–379)
RBC: 4.63 x10E6/uL (ref 3.77–5.28)
RDW: 12.3 % (ref 12.3–15.4)
WBC: 9.9 10*3/uL (ref 3.4–10.8)

## 2015-04-18 LAB — CMP14+EGFR
ALBUMIN: 4.6 g/dL (ref 3.5–4.8)
ALT: 17 IU/L (ref 0–32)
AST: 20 IU/L (ref 0–40)
Albumin/Globulin Ratio: 1.9 (ref 1.1–2.5)
Alkaline Phosphatase: 68 IU/L (ref 39–117)
BILIRUBIN TOTAL: 0.8 mg/dL (ref 0.0–1.2)
BUN/Creatinine Ratio: 17 (ref 11–26)
BUN: 13 mg/dL (ref 8–27)
CO2: 25 mmol/L (ref 18–29)
Calcium: 9.6 mg/dL (ref 8.7–10.3)
Chloride: 96 mmol/L — ABNORMAL LOW (ref 97–106)
Creatinine, Ser: 0.76 mg/dL (ref 0.57–1.00)
GFR calc Af Amer: 92 mL/min/{1.73_m2} (ref >59)
GFR calc non Af Amer: 80 mL/min/{1.73_m2} (ref >59)
GLOBULIN, TOTAL: 2.4 g/dL (ref 1.5–4.5)
Glucose: 105 mg/dL — ABNORMAL HIGH (ref 65–99)
POTASSIUM: 4.4 mmol/L (ref 3.5–5.2)
Sodium: 138 mmol/L (ref 136–144)
TOTAL PROTEIN: 7 g/dL (ref 6.0–8.5)

## 2015-04-18 LAB — HEPATITIS C ANTIBODY: HEP C VIRUS AB: 0.2 {s_co_ratio} (ref 0.0–0.9)

## 2015-04-20 ENCOUNTER — Telehealth: Payer: Self-pay | Admitting: *Deleted

## 2015-04-20 NOTE — Telephone Encounter (Signed)
Left message for patient to call back regarding prescription that was sent to pharmacy.

## 2015-04-20 NOTE — Telephone Encounter (Signed)
-----   Message from [name], MD sent at 04/17/2015 10:17 PM EST ----- Please let pt know-- instead of another cream for the rash under her breast I sent in an oral medicine called fluconazole that will hopefully clear the rash up quicker. She should take one tab every other day for 3 tabs. It is long acting so she doesn't need to take it daily. Thank you!

## 2015-04-24 ENCOUNTER — Telehealth: Payer: Self-pay | Admitting: Family Medicine

## 2015-04-24 NOTE — Telephone Encounter (Signed)
Patient is calling requesting results

## 2015-04-24 NOTE — Telephone Encounter (Signed)
Patient aware of results.

## 2015-04-24 NOTE — Telephone Encounter (Signed)
Please let p tknow--labs were fine from recent visit. Sorry for delay. Her glucose was slightly elevated, she does have pre-diabetes. We dont treat that with medicine, but she should stay as active as she can, even if that is just upper body if her knees or back bother her, watch portion sizes with meals, eat lots of fruits and vegetables, minimize sweets and no regular sodas or sweet tea.

## 2015-04-29 ENCOUNTER — Encounter: Payer: Self-pay | Admitting: Gynecologic Oncology

## 2015-04-29 ENCOUNTER — Telehealth: Payer: Self-pay | Admitting: *Deleted

## 2015-04-29 ENCOUNTER — Other Ambulatory Visit (HOSPITAL_COMMUNITY)
Admission: RE | Admit: 2015-04-29 | Discharge: 2015-04-29 | Disposition: A | Discharge status: Home / Self Care | Payer: Medicare Other | Source: Ambulatory Visit | Attending: Gynecologic Oncology | Admitting: Gynecologic Oncology

## 2015-04-29 ENCOUNTER — Ambulatory Visit: Payer: Medicare Other | Attending: Gynecologic Oncology | Admitting: Gynecologic Oncology

## 2015-04-29 ENCOUNTER — Other Ambulatory Visit: Payer: Self-pay | Admitting: Gynecologic Oncology

## 2015-04-29 VITALS — BP 154/57 | HR 65 | Temp 98.1°F | Resp 19 | Ht 60.0 in | Wt 200.2 lb

## 2015-04-29 DIAGNOSIS — Z8542 Personal history of malignant neoplasm of other parts of uterus: Secondary | ICD-10-CM

## 2015-04-29 DIAGNOSIS — Z01411 Encounter for gynecological examination (general) (routine) with abnormal findings: Secondary | ICD-10-CM | POA: Diagnosis present

## 2015-04-29 DIAGNOSIS — Z1231 Encounter for screening mammogram for malignant neoplasm of breast: Secondary | ICD-10-CM

## 2015-04-29 DIAGNOSIS — C541 Malignant neoplasm of endometrium: Secondary | ICD-10-CM | POA: Insufficient documentation

## 2015-04-29 DIAGNOSIS — N6320 Unspecified lump in the left breast, unspecified quadrant: Secondary | ICD-10-CM

## 2015-04-29 NOTE — Progress Notes (Signed)
Follow Up Note: Gyn-Onc  [name] 69 y.o. female  CC:  Chief Complaint  Patient presents with  . endometrial cancer    follow up    HPI:  [name] is a very pleasant 69 year old who initially presented with postmenopausal bleeding. She was seen by Dr. Evie Lacks and an endometrial biopsy was performed that revealed a grade 1 endometrioid adenocarcinoma. She was referred to GYN Oncology.  After discussion of risks and benefits, she opted for surgery and on November 02, 2010 underwent diagnostic laparoscopy conversion to TAH-BSO, lysis of adhesions for 20 minutes, and incisional hernia repair.   OPERATIVE FINDINGS: Included significant adhesive disease of the omentum and anterior abdominal wall, small bowel to the anterior abdominal wall, and a globular fibroid uterus. She had a 3-cm incisional hernia. Frozen section revealed minimal myometrial invasion. Fortunately, final pathology also revealed minimal myometrial invasion. She had a grade 1 endometrioid adenocarcinoma with focal superficial myometrial invasion of 0.1 cm with a myometrium of 1.8 cm or approximately 7%. She had a 5-cm tumor. No lymphovascular space Involvement.   04/23/2012 she underwent a laparoscopic lysis of adhesions for 45 minutes and repair of a recurrent incisional hernia with mesh. She's overall doing very well. Her colonoscopy is not due to the age of 54. Her primary physician is Dr. Redge Gainer.   We last saw her 10/07/14.Marland Kitchen Exam at that time was negative for cancer recurrence and her Pap smear was negative 02/27/14.  Interval History:   She had a left total hip replacement in February 2016. Her sister has recurrent colon cancer is currently on hospice at home. The patient has had issues with tinea corporis. Most recently her primary physician placed on fluconazole for Paget's that was breaking out under her left breast that did not  help. She's also been using the nystatin that we have given her and that is not helping. She had an A1c in December that was 5. She states she's due for her mammogram. She was due in July. The breast center would not make her appointment as she did not have an order.  Review of Systems  Constitutional: Denies fever. Intentional weight los Skin: + rash under left breast Cardiovascular: No chest pain, shortness of breath, or edema  Pulmonary: No cough or wheeze.  Gastro Intestinal:  No nausea, vomiting, constipation, or diarrhea reported. No bright red blood per rectum or change in bowel movement.  Genitourinary: No frequency, urgency, or dysuria. + urinary incontinence with activity. Denies vaginal bleeding and discharge.  Musculoskeletal: Markedly decreased hip pain Neurologic: No weakness, numbness, or change in gait.  Psychology: No changes.   Current Meds:  Outpatient Encounter Prescriptions as of 04/29/2015  Medication Sig  . amitriptyline (ELAVIL) 25 MG tablet Take 1 tablet (25 mg total) by mouth at bedtime.  . gabapentin (NEURONTIN) 300 MG capsule Take 1 capsule (300 mg total) by mouth at bedtime.  Marland Kitchen HYDROcodone-acetaminophen (NORCO) 10-325 MG per tablet Take 1 tablet by mouth 3 (three) times daily.  Marland Kitchen lisinopril-hydrochlorothiazide (ZESTORETIC) 20-12.5 MG tablet Take 1 tablet by mouth daily.  . meloxicam (MOBIC) 15 MG tablet Take 1 tablet (15 mg total) by mouth daily.  . metroNIDAZOLE (METROGEL) 1 % gel Apply topically daily.  Marland Kitchen nystatin cream (MYCOSTATIN) Apply 1 application topically 2 (two) times daily.  . [DISCONTINUED] hydrochlorothiazide (MICROZIDE) 12.5 MG capsule   . docusate sodium (COLACE) 100 MG capsule Take 100 mg by mouth at bedtime. Reported on 04/29/2015  . [DISCONTINUED] fluconazole (DIFLUCAN) 150  MG tablet Take 1 tablet (150 mg total) by mouth every other day.   No facility-administered encounter medications on file as of 04/29/2015.    Allergy:  Allergies   Allergen Reactions  . Cashew Nut Oil Hives  . Sulfamethoxazole     Mouth sores  . Zofran Hives and Nausea Only    Social Hx:   Social History   Social History  . Marital Status: Single    Spouse Name: N/A  . Number of Children: N/A  . Years of Education: N/A   Occupational History  . Not on file.   Social History Main Topics  . Smoking status: Never Smoker   . Smokeless tobacco: Not on file  . Alcohol Use: No  . Drug Use: No  . Sexual Activity: No   Other Topics Concern  . Not on file   Social History Narrative    Past Surgical Hx:  Past Surgical History  Procedure Laterality Date  . Abdominal hysterectomy  11/02/10    TAH, BSO, lysis of adhesions for endometrial cancer  . Incisional hernia repair  11/02/10  . Back surgery  1989, 1999, 2009    anterior L2-3, L3-4 arthrodesis  . Kidney surgery  2010    Left kidney  . Bladder suspension  2003  . Foot surgery  06/2010    left foot surgery  . Joint replacement  09/2009    Right total knee  . Total hip arthroplasty  01/2010    Right total hip  . Cataract extraction, bilateral  2003  . Total knee arthroplasty      right  . Ureterolysis  2010    lap/open left ureterolysis & omental flap, ureter stent  . Ventral hernia repair N/A 07/19/2012    Procedure: LAPAROSCOPIC LYSIS OF ADHESIONS, SMALL BOWEL RESECTION, SEROSAL REPAIR, PRIMARY VENTRAL HERNIA REPAIR;  Surgeon: Adin Hector, MD;  Location: [facility];  Service: General;  Laterality: N/A;  . Hernia repair  07/19/12    lap Chamberino repair  . Eye surgery    . Back surgery  12/17/2012    Spinal fusion and repair   . Ventral hernia repair N/A 04/23/2013    Procedure: LAPAROSCOPIC exploration and repair of hernia in abdominal VENTRAL wall  HERNIA;  Surgeon: Adin Hector, MD;  Location: [facility];  Service: General;  Laterality: N/A;  . Insertion of mesh N/A 04/23/2013    Procedure: INSERTION OF MESH;  Surgeon: Adin Hector, MD;  Location: [facility];  Service: General;   Laterality: N/A;  . Laparoscopic lysis of adhesions N/A 04/23/2013    Procedure: LAPAROSCOPIC LYSIS OF ADHESIONS;  Surgeon: Adin Hector, MD;  Location: [facility];  Service: General;  Laterality: N/A;  . Total hip arthroplasty Left 07/04/2014    Procedure: LEFT TOTAL HIP ARTHROPLASTY ANTERIOR APPROACH;  Surgeon: Gearlean Alf, MD;  Location: [facility];  Service: Orthopedics;  Laterality: Left;    Past Medical Hx:  Past Medical History  Diagnosis Date  . Endometrioid adenocarcinoma   . Hypertension   . Obesity (BMI 30-39.9) 04/27/2012  . Arthritis   . Hyperlipidemia   . Ureteral adhesion, left 2010    s/p ureterolysis & stenting Dr. Larwance Sachs now  . 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 Colon polyp, hyperplastic 04/27/2012    Colonoscopy 2010.  No adenomatous polyps   . History of endometrial cancer s/p TAH BSO [id number] 06/15/2011    Grade 1 endometrioid adenocarcinoma with  focal superficial myometrial invasion of 0.1 cm with a myometrium of 1.8 cm or approximately 7%. She had a 5-cm tumor. No lymphovascular space Involvement.    . Mass of in fold of jejunum s/p SB resection 07/19/2012 07/20/2012  . UTI (urinary tract infection)   . SBO (small bowel obstruction) - recurrent 04/27/2012    Family Hx:  Family History  Problem Relation Age of Onset  . Diabetes Mother   . Cancer Mother     lung  . Diabetes Father   . Cancer Father     liver  . Colon cancer Sister   . Diabetes Sister   . Cancer Sister     colon    Vitals:  Blood pressure 154/57, pulse 65, temperature 98.1 F (36.7 C), temperature source Oral, resp. rate 19, height 5' (1.524 m), weight 200 lb 3.2 oz (90.81 kg), SpO2 95 %.  Physical Exam:  General: Well developed, well nourished female in no acute distress. Alert and oriented x 3.    Lymph node survey: No cervical, supraclavicular, or inguinal adenopathy.   Under the left breast there is approximate 5 x 3  cm area of tinea corporis  Cardiovascular: Regular rate and rhythm.  Lungs: Clear to auscultation bilaterally.   Skin: No rashes or lesions present.   Back: No CVA tenderness.   Abdomen: Abdomen soft, non-tender and obese.  Active bowel sounds in all quadrants. No evidence of a fluid wave or abdominal masses.  No distension, tenderness, rigidity, or guarding. No appreciable hernias. Exam limited by habitus.  Genitourinary:    Vulva/vagina: Normal external female genitalia. No lesions.    Urethra: No lesions or masses    Vagina: Atrophic without any lesions. No palpable masses. No vaginal bleeding or drainage noted. Pap smear submitted. Rectal: Good tone, no masses no cul de sac nodularity.   Extremities: No bilateral cyanosis or clubbing.   Well-healed incision over left hip.  Assessment/Plan:  [name] is a 69 year old with stage IA, grade 1 endometrioid adenocarcinoma, who requires no postoperative adjuvant therapy.  She has been without evidence of recurrence since 2012.   She is to follow up with [place] in 6 months and we will follow up on the results of her pap smear. She has follow-up scheduled with her dermatologist (Dr. [place] Crane). I encouraged her to discuss the tinea corporis with Dr. [place] Crane to see if they had other options for treatment. We will place an order for her to have her mammogram performed.  Zack Crager A., MD 04/29/2015, 1:44 PM

## 2015-04-29 NOTE — Addendum Note (Signed)
Addended by: Elizebeth Koller on: 04/29/2015 02:58 PM   Modules accepted: Orders

## 2015-04-29 NOTE — Patient Instructions (Signed)
We will notify you of the results of your Pap smear. Please return to see [place] in 6 months. Happy holidays!

## 2015-04-29 NOTE — Telephone Encounter (Signed)
Left message on voicemail with details of future mammogram appt. Pt is scheduled for mammogram on 05/07/2015 @ 10:30 at [facility]

## 2015-05-01 LAB — CYTOLOGY - PAP

## 2015-05-06 ENCOUNTER — Telehealth: Payer: Self-pay

## 2015-05-06 NOTE — Telephone Encounter (Signed)
Orders received from [place], APNP to contact the patient to update with PAP results obtained on Apr 29, 2015 during her visit with Dr Alycia Rossetti was "normal" . Attempted to reach the patient , no answer , left a detailed message with PAP being "normal" from 04/29/2015 , call back number provided if additional questions arise.

## 2015-05-07 ENCOUNTER — Ambulatory Visit
Admission: RE | Admit: 2015-05-07 | Discharge: 2015-05-07 | Disposition: A | Discharge status: Home / Self Care | Payer: Medicare Other | Source: Ambulatory Visit | Attending: Gynecologic Oncology | Admitting: Gynecologic Oncology

## 2015-05-07 DIAGNOSIS — N6320 Unspecified lump in the left breast, unspecified quadrant: Secondary | ICD-10-CM

## 2015-05-14 DIAGNOSIS — C44310 Basal cell carcinoma of skin of unspecified parts of face: Secondary | ICD-10-CM | POA: Diagnosis not present

## 2015-05-14 DIAGNOSIS — C44319 Basal cell carcinoma of skin of other parts of face: Secondary | ICD-10-CM | POA: Diagnosis not present

## 2015-05-14 DIAGNOSIS — L304 Erythema intertrigo: Secondary | ICD-10-CM | POA: Diagnosis not present

## 2015-06-18 DIAGNOSIS — Z08 Encounter for follow-up examination after completed treatment for malignant neoplasm: Secondary | ICD-10-CM | POA: Diagnosis not present

## 2015-06-18 DIAGNOSIS — Z85828 Personal history of other malignant neoplasm of skin: Secondary | ICD-10-CM | POA: Diagnosis not present

## 2015-06-19 DIAGNOSIS — Z471 Aftercare following joint replacement surgery: Secondary | ICD-10-CM | POA: Diagnosis not present

## 2015-06-19 DIAGNOSIS — Z96642 Presence of left artificial hip joint: Secondary | ICD-10-CM | POA: Diagnosis not present

## 2015-07-01 ENCOUNTER — Other Ambulatory Visit: Payer: Self-pay | Admitting: Family Medicine

## 2015-07-01 DIAGNOSIS — G8929 Other chronic pain: Secondary | ICD-10-CM

## 2015-07-01 MED ORDER — MELOXICAM 15 MG PO TABS: 15.0000 mg | ORAL_TABLET | Freq: Every day | ORAL | Status: DC

## 2015-07-01 NOTE — Telephone Encounter (Signed)
done

## 2015-07-10 ENCOUNTER — Other Ambulatory Visit: Payer: Self-pay | Admitting: Pediatrics

## 2015-07-10 DIAGNOSIS — G8929 Other chronic pain: Secondary | ICD-10-CM

## 2015-07-10 MED ORDER — GABAPENTIN 300 MG PO CAPS: 300.0000 mg | ORAL_CAPSULE | Freq: Every day | ORAL | Status: DC

## 2015-07-10 NOTE — Telephone Encounter (Signed)
done

## 2015-07-13 DIAGNOSIS — I10 Essential (primary) hypertension: Secondary | ICD-10-CM | POA: Diagnosis not present

## 2015-07-13 DIAGNOSIS — Z6839 Body mass index (BMI) 39.0-39.9, adult: Secondary | ICD-10-CM | POA: Diagnosis not present

## 2015-07-13 DIAGNOSIS — M7072 Other bursitis of hip, left hip: Secondary | ICD-10-CM | POA: Diagnosis not present

## 2015-07-13 DIAGNOSIS — R69 Illness, unspecified: Secondary | ICD-10-CM | POA: Diagnosis not present

## 2015-07-13 DIAGNOSIS — M461 Sacroiliitis, not elsewhere classified: Secondary | ICD-10-CM | POA: Diagnosis not present

## 2015-07-13 DIAGNOSIS — Z79899 Other long term (current) drug therapy: Secondary | ICD-10-CM | POA: Diagnosis not present

## 2015-07-13 DIAGNOSIS — M545 Low back pain: Secondary | ICD-10-CM | POA: Diagnosis not present

## 2015-07-13 DIAGNOSIS — M7071 Other bursitis of hip, right hip: Secondary | ICD-10-CM | POA: Diagnosis not present

## 2015-07-16 ENCOUNTER — Encounter: Payer: Self-pay | Admitting: Pediatrics

## 2015-07-16 ENCOUNTER — Ambulatory Visit (INDEPENDENT_AMBULATORY_CARE_PROVIDER_SITE_OTHER): Payer: Medicare HMO | Admitting: Pediatrics

## 2015-07-16 VITALS — BP 115/55 | HR 63 | Temp 97.4°F | Ht 60.0 in | Wt 199.6 lb

## 2015-07-16 DIAGNOSIS — G8929 Other chronic pain: Secondary | ICD-10-CM

## 2015-07-16 DIAGNOSIS — R739 Hyperglycemia, unspecified: Secondary | ICD-10-CM

## 2015-07-16 DIAGNOSIS — I10 Essential (primary) hypertension: Secondary | ICD-10-CM

## 2015-07-16 MED ORDER — LISINOPRIL-HYDROCHLOROTHIAZIDE 10-12.5 MG PO TABS: 1.0000 | ORAL_TABLET | Freq: Every day | ORAL | Status: DC

## 2015-07-16 MED ORDER — GABAPENTIN 300 MG PO CAPS: 300.0000 mg | ORAL_CAPSULE | Freq: Every day | ORAL | Status: DC

## 2015-07-16 NOTE — Progress Notes (Signed)
    Subjective:    Patient ID: [name], female    DOB: [birth year], 70 y.o.   MRN: [id number]  CC: Follow-up multiple med problems  HPI: [name] is a 70 y.o. female presenting for Follow-up  IBS: takes probiotics regularly  Denies and HA, chest pain, SOB  Pain in hip doing well. Takes gabapentin and amitriptyline at night, helps  Trying to stay active  Has been on cholesterol medicine in the past, made her feel bad so she stopped it.  Has been feeling more tired at times, draggy. No lightheadedness or pre-syncopal feeling.  Depression screen [facility] 2/9 07/16/2015 04/17/2015 01/06/2015 10/29/2014 06/18/2014  Decreased Interest 0 0 0 0 0  Down, Depressed, Hopeless 0 - 0 0 0  PHQ - 2 Score 0 0 0 0 0     Relevant past medical, surgical, family and social history reviewed and updated as indicated. Interim medical history since our last visit reviewed. Allergies and medications reviewed and updated.    ROS: Per HPI unless specifically indicated above  History  Smoking status  . Never Smoker   Smokeless tobacco  . Not on file    Past Medical History Patient Active Problem List   Diagnosis Date Noted  . OA (osteoarthritis) of hip 07/04/2014  . 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 Pseudoarthrosis L3- L5 12/17/2012  . Nocturnal leg cramps 11/13/2012  . HLD (hyperlipidemia) 08/10/2012  . Recurrent ventral incisional hernia s/p lap repair 04/23/2013 04/27/2012  . Obesity (BMI 30-39.9) 04/27/2012  . Chronic pain 04/27/2012  . Hypokalemia 04/27/2012  . Hypertension   . History of endometrial cancer s/p TAH BSO June2013 06/15/2011        Objective:    BP 115/55 mmHg  Pulse 63  Temp(Src) 97.4 F (36.3 C) (Oral)  Ht 5' (1.524 m)  Wt 199 lb 9.6 oz (90.538 kg)  BMI 38.98 kg/m2  Wt Readings from Last 3 Encounters:  07/16/15 199 lb 9.6 oz (90.538 kg)  04/29/15 200 lb 3.2 oz (90.81 kg)  04/17/15 196 lb 3.2 oz  (88.996 kg)     Gen: NAD, alert, cooperative with exam, NCAT EYES: EOMI, no scleral injection or icterus ENT:   OP without erythema LYMPH: no cervical LAD CV: NRRR, normal S1/S2, no murmur, distal pulses 2+ b/l Resp: CTABL, no wheezes, normal WOB Abd: +BS, soft, NTND. no guarding or organomegaly Ext: No edema, warm Neuro: Alert and oriented, strength equal b/l UE and LE, coordination grossly normal MSK: normal muscle bulk     Assessment & Plan:    [name] was seen today for follow-up multiple med problems.   Diagnoses and all orders for this visit:  Chronic pain Well controlled. -     gabapentin (NEURONTIN) 300 MG capsule; Take 1 capsule (300 mg total) by mouth at bedtime. Continue amitriptyline at bedtime  Essential hypertension BPs have been on low side, someitmes feeling draggy at home. Will decrease lisinopril, continue to check at home. -     lisinopril-hydrochlorothiazide (PRINZIDE,ZESTORETIC) 10-12.5 MG tablet; Take 1 tablet by mouth daily.  Hyperglycemia -     Hemoglobin A1c   Follow up plan: Return in about 6 months (around 01/16/2016) for multiple med problem f/u.  [name], MD [facility] 07/16/2015, 10:04 AM

## 2015-07-16 NOTE — Patient Instructions (Addendum)
Let me know if blood pressure at home is >140 or >90 on bottom  Take a half pill of the blood pressure medicine you have left at home daily until new medicine comes with the lower dose.

## 2015-07-17 LAB — HEMOGLOBIN A1C
Est. average glucose Bld gHb Est-mCnc: 134 mg/dL
Hgb A1c MFr Bld: 6.3 % — ABNORMAL HIGH (ref 4.8–5.6)

## 2015-10-01 ENCOUNTER — Encounter: Payer: Self-pay | Admitting: Nurse Practitioner

## 2015-10-01 ENCOUNTER — Ambulatory Visit (INDEPENDENT_AMBULATORY_CARE_PROVIDER_SITE_OTHER): Payer: Medicare HMO | Admitting: Nurse Practitioner

## 2015-10-01 ENCOUNTER — Other Ambulatory Visit: Payer: Self-pay | Admitting: Family Medicine

## 2015-10-01 VITALS — BP 162/76 | HR 65 | Temp 96.8°F | Ht 60.0 in | Wt 200.0 lb

## 2015-10-01 DIAGNOSIS — R300 Dysuria: Secondary | ICD-10-CM

## 2015-10-01 LAB — URINALYSIS, COMPLETE
Bilirubin, UA: NEGATIVE
Glucose, UA: NEGATIVE
Ketones, UA: NEGATIVE
Leukocytes, UA: NEGATIVE
NITRITE UA: NEGATIVE
PH UA: 6 (ref 5.0–7.5)
PROTEIN UA: NEGATIVE
RBC, UA: NEGATIVE
Specific Gravity, UA: 1.01 (ref 1.005–1.030)
UUROB: 0.2 mg/dL (ref 0.2–1.0)

## 2015-10-01 LAB — MICROSCOPIC EXAMINATION: RBC MICROSCOPIC, UA: NONE SEEN /HPF (ref 0–?)

## 2015-10-01 MED ORDER — CIPROFLOXACIN HCL 250 MG PO TABS: 250.0000 mg | ORAL_TABLET | Freq: Two times a day (BID) | ORAL | Status: DC

## 2015-10-01 NOTE — Patient Instructions (Signed)

## 2015-10-01 NOTE — Progress Notes (Signed)
  Subjective:    [name] is a 70 y.o. female who complains of dysuria, foul smelling urine, frequency and urgency. She has had symptoms for 1 week. Patient also complains of stomach ache. Patient denies back pain and fever. Patient does have a history of recurrent UTI. Patient does not have a history of pyelonephritis.   The following portions of the patient's history were reviewed and updated as appropriate: allergies, current medications, past family history, past medical history, past social history, past surgical history and problem list.  Review of Systems Pertinent items are noted in HPI.    Objective:    General appearance: alert and cooperative Lungs: clear to auscultation bilaterally Heart: regular rate and rhythm, S1, S2 normal, no murmur, click, rub or gallop Abdomen: soft, non-tender; bowel sounds normal; no masses,  no organomegaly and no CVA Tenderness  Laboratory:  Urine dipstick: negative for all components.   Micro exam: negative for WBCs or RBCs.    Assessment:    dysuria     Plan:   Take medication as prescribe Cotton underwear Take shower not bath Cranberry juice, yogurt Force fluids AZO over the counter X2 days Culture pending RTO prn  Meds ordered this encounter  Medications  . ciprofloxacin (CIPRO) 250 MG tablet    Sig: Take 1 tablet (250 mg total) by mouth 2 (two) times daily.    Dispense:  6 tablet    Refill:  0    Order Specific Question:  Supervising Provider    Answer:  Chipper Herb [id number]    Mary-Margaret Hassell Done, FNP

## 2015-10-02 LAB — URINE CULTURE

## 2015-10-15 DIAGNOSIS — M545 Low back pain: Secondary | ICD-10-CM | POA: Diagnosis not present

## 2015-10-15 DIAGNOSIS — R69 Illness, unspecified: Secondary | ICD-10-CM | POA: Diagnosis not present

## 2015-10-15 DIAGNOSIS — M461 Sacroiliitis, not elsewhere classified: Secondary | ICD-10-CM | POA: Diagnosis not present

## 2015-10-15 DIAGNOSIS — M7071 Other bursitis of hip, right hip: Secondary | ICD-10-CM | POA: Diagnosis not present

## 2015-10-15 DIAGNOSIS — Z6839 Body mass index (BMI) 39.0-39.9, adult: Secondary | ICD-10-CM | POA: Diagnosis not present

## 2015-10-15 DIAGNOSIS — Z79899 Other long term (current) drug therapy: Secondary | ICD-10-CM | POA: Diagnosis not present

## 2015-10-28 ENCOUNTER — Ambulatory Visit: Payer: Medicare Other | Admitting: Gynecologic Oncology

## 2015-11-04 ENCOUNTER — Ambulatory Visit: Payer: Medicare HMO | Attending: Gynecologic Oncology | Admitting: Gynecologic Oncology

## 2015-11-04 ENCOUNTER — Encounter: Payer: Self-pay | Admitting: Gynecologic Oncology

## 2015-11-04 VITALS — BP 145/55 | HR 86 | Temp 97.8°F | Resp 18 | Ht 60.0 in | Wt 195.8 lb

## 2015-11-04 DIAGNOSIS — Z888 Allergy status to other drugs, medicaments and biological substances status: Secondary | ICD-10-CM | POA: Insufficient documentation

## 2015-11-04 DIAGNOSIS — Z800 Family history of malignant neoplasm of digestive organs: Secondary | ICD-10-CM | POA: Diagnosis not present

## 2015-11-04 DIAGNOSIS — Z91018 Allergy to other foods: Secondary | ICD-10-CM | POA: Insufficient documentation

## 2015-11-04 DIAGNOSIS — M1990 Unspecified osteoarthritis, unspecified site: Secondary | ICD-10-CM | POA: Diagnosis not present

## 2015-11-04 DIAGNOSIS — Z981 Arthrodesis status: Secondary | ICD-10-CM | POA: Diagnosis not present

## 2015-11-04 DIAGNOSIS — N950 Postmenopausal bleeding: Secondary | ICD-10-CM | POA: Insufficient documentation

## 2015-11-04 DIAGNOSIS — I10 Essential (primary) hypertension: Secondary | ICD-10-CM | POA: Diagnosis not present

## 2015-11-04 DIAGNOSIS — Z96641 Presence of right artificial hip joint: Secondary | ICD-10-CM | POA: Insufficient documentation

## 2015-11-04 DIAGNOSIS — E785 Hyperlipidemia, unspecified: Secondary | ICD-10-CM | POA: Diagnosis not present

## 2015-11-04 DIAGNOSIS — Z90722 Acquired absence of ovaries, bilateral: Secondary | ICD-10-CM | POA: Insufficient documentation

## 2015-11-04 DIAGNOSIS — K432 Incisional hernia without obstruction or gangrene: Secondary | ICD-10-CM | POA: Insufficient documentation

## 2015-11-04 DIAGNOSIS — C541 Malignant neoplasm of endometrium: Secondary | ICD-10-CM | POA: Diagnosis not present

## 2015-11-04 DIAGNOSIS — Z801 Family history of malignant neoplasm of trachea, bronchus and lung: Secondary | ICD-10-CM | POA: Diagnosis not present

## 2015-11-04 DIAGNOSIS — Z9841 Cataract extraction status, right eye: Secondary | ICD-10-CM | POA: Diagnosis not present

## 2015-11-04 DIAGNOSIS — Z90710 Acquired absence of both cervix and uterus: Secondary | ICD-10-CM | POA: Insufficient documentation

## 2015-11-04 DIAGNOSIS — Z8542 Personal history of malignant neoplasm of other parts of uterus: Secondary | ICD-10-CM | POA: Diagnosis not present

## 2015-11-04 DIAGNOSIS — Z6838 Body mass index (BMI) 38.0-38.9, adult: Secondary | ICD-10-CM | POA: Insufficient documentation

## 2015-11-04 DIAGNOSIS — Z87440 Personal history of urinary (tract) infections: Secondary | ICD-10-CM | POA: Diagnosis not present

## 2015-11-04 DIAGNOSIS — Z9842 Cataract extraction status, left eye: Secondary | ICD-10-CM | POA: Insufficient documentation

## 2015-11-04 DIAGNOSIS — D259 Leiomyoma of uterus, unspecified: Secondary | ICD-10-CM | POA: Insufficient documentation

## 2015-11-04 DIAGNOSIS — E669 Obesity, unspecified: Secondary | ICD-10-CM | POA: Insufficient documentation

## 2015-11-04 DIAGNOSIS — M25551 Pain in right hip: Secondary | ICD-10-CM | POA: Diagnosis not present

## 2015-11-04 DIAGNOSIS — Z833 Family history of diabetes mellitus: Secondary | ICD-10-CM | POA: Diagnosis not present

## 2015-11-04 DIAGNOSIS — K635 Polyp of colon: Secondary | ICD-10-CM | POA: Diagnosis not present

## 2015-11-04 DIAGNOSIS — Z882 Allergy status to sulfonamides status: Secondary | ICD-10-CM | POA: Diagnosis not present

## 2015-11-04 NOTE — Progress Notes (Signed)
Follow Up Note: Gyn-Onc  [name] 70 y.o. female  CC:  Chief Complaint  Patient presents with  . Follow-up    HPI:  [name] is a very pleasant 70 year old who initially presented with postmenopausal bleeding. She was seen by Dr. Evie Lacks and an endometrial biopsy was performed that revealed a grade 1 endometrioid adenocarcinoma. She was referred to GYN Oncology.  After discussion of risks and benefits, she opted for surgery and on November 02, 2010 underwent diagnostic laparoscopy conversion to TAH-BSO, lysis of adhesions for 20 minutes, and incisional hernia repair.   OPERATIVE FINDINGS: Included significant adhesive disease of the omentum and anterior abdominal wall, small bowel to the anterior abdominal wall, and a globular fibroid uterus. She had a 3-cm incisional hernia. Frozen section revealed minimal myometrial invasion. Fortunately, final pathology also revealed minimal myometrial invasion. She had a grade 1 endometrioid adenocarcinoma with focal superficial myometrial invasion of 0.1 cm with a myometrium of 1.8 cm or approximately 7%. She had a 5-cm tumor. No lymphovascular space Involvement.   04/23/2012 she underwent a laparoscopic lysis of adhesions for 45 minutes and repair of a recurrent incisional hernia with mesh. She's overall doing very well. Her colonoscopy is not due to the age of 30. Her primary physician is Dr. Redge Gainer.   We last saw her 12/16. Exam at that time was negative for cancer recurrence as was her pap smear.  Interval History:   She's overall doing quite well. She is complaining of some right hip pain. She had that hip replacement 2009. She's having pain walking and is using a cane. She has been seen by her pain specialist who did an injection diagnosed her with bursitis that she's currently following up with orthopedist. She did have a small basal cell carcinoma removed  off side of her face. She has follow-up with her dermatologist in August. The only new medical issues and her family's her daughter-in-law had a brain abscess from a sinusitis infection. That is improved and she is recovering well. She is a new great-granddaughter [name] was born a week ago. She is up-to-date on her mammograms.  Review of Systems Constitutional: Denies fever. Intentional weight los Skin: No lesions Cardiovascular: No chest pain, shortness of breath, or edema  Pulmonary: No cough or wheeze.  Gastro Intestinal:  No nausea, vomiting, constipation, or diarrhea reported. No bright red blood per rectum or change in bowel movement.  Genitourinary: No frequency, urgency, or dysuria. + urinary incontinence with activity. Denies vaginal bleeding and discharge.  Musculoskeletal: Increased right hip pain. The left hip is doing very well. Neurologic: No weakness, numbness, or change in gait.  Psychology: No changes.   Current Meds:  Outpatient Encounter Prescriptions as of 11/04/2015  Medication Sig  . amitriptyline (ELAVIL) 25 MG tablet Take 1 tablet (25 mg total) by mouth at bedtime.  . docusate sodium (COLACE) 100 MG capsule Take 100 mg by mouth at bedtime. Reported on 04/29/2015  . gabapentin (NEURONTIN) 300 MG capsule Take 1 capsule (300 mg total) by mouth at bedtime.  Marland Kitchen HYDROcodone-acetaminophen (NORCO) 10-325 MG per tablet Take 1 tablet by mouth 3 (three) times daily.  Marland Kitchen lisinopril-hydrochlorothiazide (PRINZIDE,ZESTORETIC) 10-12.5 MG tablet Take 1 tablet by mouth daily.  . meloxicam (MOBIC) 15 MG tablet TAKE 1 TABLET DAILY  . metroNIDAZOLE (METROGEL) 1 % gel Apply topically daily.  Marland Kitchen nystatin cream (MYCOSTATIN) Apply 1 application topically 2 (two) times daily.  . [DISCONTINUED] ciprofloxacin (CIPRO) 250 MG tablet Take 1 tablet (250  mg total) by mouth 2 (two) times daily.  . [DISCONTINUED] HYDROcodone-acetaminophen (NORCO/VICODIN) 5-325 MG tablet    No facility-administered  encounter medications on file as of 11/04/2015.    Allergy:  Allergies  Allergen Reactions  . Cashew Nut Oil Hives  . Sulfamethoxazole     Mouth sores  . Zofran Hives and Nausea Only    Social Hx:   Social History   Social History  . Marital Status: Single    Spouse Name: N/A  . Number of Children: N/A  . Years of Education: N/A   Occupational History  . Not on file.   Social History Main Topics  . Smoking status: Never Smoker   . Smokeless tobacco: Not on file  . Alcohol Use: No  . Drug Use: No  . Sexual Activity: No   Other Topics Concern  . Not on file   Social History Narrative    Past Surgical Hx:  Past Surgical History  Procedure Laterality Date  . Abdominal hysterectomy  11/02/10    TAH, BSO, lysis of adhesions for endometrial cancer  . Incisional hernia repair  11/02/10  . Back surgery  1989, 1999, 2009    anterior L2-3, L3-4 arthrodesis  . Kidney surgery  2010    Left kidney  . Bladder suspension  2003  . Foot surgery  06/2010    left foot surgery  . Joint replacement  09/2009    Right total knee  . Total hip arthroplasty  01/2010    Right total hip  . Cataract extraction, bilateral  2003  . Total knee arthroplasty      right  . Ureterolysis  2010    lap/open left ureterolysis & omental flap, ureter stent  . Ventral hernia repair N/A 07/19/2012    Procedure: LAPAROSCOPIC LYSIS OF ADHESIONS, SMALL BOWEL RESECTION, SEROSAL REPAIR, PRIMARY VENTRAL HERNIA REPAIR;  Surgeon: Adin Hector, MD;  Location: [facility];  Service: General;  Laterality: N/A;  . Hernia repair  07/19/12    lap [place] repair  . Eye surgery    . Back surgery  12/17/2012    Spinal fusion and repair   . Ventral hernia repair N/A 04/23/2013    Procedure: LAPAROSCOPIC exploration and repair of hernia in abdominal VENTRAL wall  HERNIA;  Surgeon: Adin Hector, MD;  Location: [facility];  Service: General;  Laterality: N/A;  . Insertion of mesh N/A 04/23/2013    Procedure: INSERTION OF MESH;   Surgeon: Adin Hector, MD;  Location: [facility];  Service: General;  Laterality: N/A;  . Laparoscopic lysis of adhesions N/A 04/23/2013    Procedure: LAPAROSCOPIC LYSIS OF ADHESIONS;  Surgeon: Adin Hector, MD;  Location: [facility];  Service: General;  Laterality: N/A;  . Total hip arthroplasty Left 07/04/2014    Procedure: LEFT TOTAL HIP ARTHROPLASTY ANTERIOR APPROACH;  Surgeon: Gearlean Alf, MD;  Location: [place];  Service: Orthopedics;  Laterality: Left;    Past Medical Hx:  Past Medical History  Diagnosis Date  . Endometrioid adenocarcinoma   . Hypertension   . Obesity (BMI 30-39.9) 04/27/2012  . Arthritis   . Hyperlipidemia   . Ureteral adhesion, left 2010    s/p ureterolysis & stenting Dr. Larwance Sachs now  . 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 Colon polyp, hyperplastic 04/27/2012    Colonoscopy 2010.  No adenomatous polyps   . History of endometrial cancer s/p TAH BSO June2013 06/15/2011    Grade  1 endometrioid adenocarcinoma with focal superficial myometrial invasion of 0.1 cm with a myometrium of 1.8 cm or approximately 7%. She had a 5-cm tumor. No lymphovascular space Involvement.    . Mass of in fold of jejunum s/p SB resection 07/19/2012 07/20/2012  . UTI (urinary tract infection)   . SBO (small bowel obstruction) - recurrent 04/27/2012    Family Hx:  Family History  Problem Relation Age of Onset  . Diabetes Mother   . Cancer Mother     lung  . Diabetes Father   . Cancer Father     liver  . Colon cancer Sister   . Diabetes Sister   . Cancer Sister     colon    Vitals:  Blood pressure 145/55, pulse 86, temperature 97.8 F (36.6 C), temperature source Oral, resp. rate 18, height 5' (1.524 m), weight 195 lb 12.8 oz (88.814 kg), SpO2 95 %.  Physical Exam:  General: Well developed, well nourished female in no acute distress. Alert and oriented x 3.    Neck: No lymphadenopathy, no thyromegaly.  Lymph  node survey: No cervical, supraclavicular, or inguinal adenopathy.   Cardiovascular: Regular rate and rhythm.  Lungs: Clear to auscultation bilaterally.   Skin: Bug bite on the left lower abdomen. The patient was shown the area with the mirror. There is no purulence, there is no induration.  Back: No CVA tenderness.   Abdomen: Abdomen soft, non-tender and obese.  Active bowel sounds in all quadrants. No evidence of a fluid wave or abdominal masses.  No distension, tenderness, rigidity, or guarding. No appreciable hernias. Exam limited by habitus. No evidence of an incisional hernia. Well-healed vertical midline incision.  Genitourinary:    Vulva/vagina: Normal external female genitalia. No lesions.    Urethra: No lesions or masses    Vagina: Atrophic without any lesions. No palpable masses. No vaginal bleeding or drainage noted.  Rectal: Good tone, no masses no cul de sac nodularity.   Extremities: No bilateral cyanosis or clubbing.     Assessment/Plan:  [name] is a 70 year old with stage IA, grade 1 endometrioid adenocarcinoma, who requires no postoperative adjuvant therapy.  She has been without evidence of recurrence since 2012.   She is 5 years out from her diagnosis and treatment and can be released from our clinic. She was congratulated with this and is very pleased. She can follow-up with her other physicians as scheduled.  [name] A., MD 11/04/2015, 10:52 AM

## 2015-11-04 NOTE — Patient Instructions (Signed)
Congratulations!. We are very pleased to celebrate your 5 year anniversary with you. Please follow-up with your primary care physicians for your gynecologic needs. However, please be assured that if you ever need to see [place] in the future we'll be more than happy to.

## 2015-11-18 ENCOUNTER — Ambulatory Visit (INDEPENDENT_AMBULATORY_CARE_PROVIDER_SITE_OTHER): Payer: Medicare HMO

## 2015-11-18 ENCOUNTER — Ambulatory Visit (INDEPENDENT_AMBULATORY_CARE_PROVIDER_SITE_OTHER): Payer: Medicare HMO | Admitting: Pediatrics

## 2015-11-18 ENCOUNTER — Encounter: Payer: Self-pay | Admitting: Pediatrics

## 2015-11-18 VITALS — BP 151/73 | HR 57 | Temp 97.0°F | Ht 60.0 in | Wt 199.6 lb

## 2015-11-18 DIAGNOSIS — N3281 Overactive bladder: Secondary | ICD-10-CM

## 2015-11-18 DIAGNOSIS — M1610 Unilateral primary osteoarthritis, unspecified hip: Secondary | ICD-10-CM

## 2015-11-18 DIAGNOSIS — G8929 Other chronic pain: Secondary | ICD-10-CM

## 2015-11-18 DIAGNOSIS — E669 Obesity, unspecified: Secondary | ICD-10-CM | POA: Diagnosis not present

## 2015-11-18 DIAGNOSIS — M25551 Pain in right hip: Secondary | ICD-10-CM

## 2015-11-18 DIAGNOSIS — I10 Essential (primary) hypertension: Secondary | ICD-10-CM | POA: Diagnosis not present

## 2015-11-18 MED ORDER — OXYBUTYNIN CHLORIDE 5 MG PO TABS: 5.0000 mg | ORAL_TABLET | Freq: Two times a day (BID) | ORAL | Status: DC

## 2015-11-18 NOTE — Progress Notes (Signed)
    Subjective:    Patient ID: [name], female    DOB: 12-30-1945, 70 y.o.   MRN: [id number]  CC: Hip Pain   HPI: [name] is a 70 y.o. female presenting for Hip Pain  Hip OA: [name] ortho did hip replacement b/l, 2015 L, 2009 R. Now R hip bothering her Having pain R side with walking Had two bursitis injections, 3/17, and 6/17 and didn't help Feels worst in the morning Taking hydrocodone 10mg  in the morning, 5mg  at noon and in evening, prescribed by pain clinic for pain in hip Also on gabapentin and amitryptiline Today is a pretty good day, yesterday hurt a lot, pain when she presses on lateral side of R hip No fevers No chest pain, no HA, no SOB Moving around well when she doesn't have pain in leg, has good days and bad days   Depression screen [facility] 2/9 11/18/2015 10/01/2015 07/16/2015 04/17/2015 01/06/2015  Decreased Interest 0 0 0 0 0  Down, Depressed, Hopeless 0 0 0 - 0  PHQ - 2 Score 0 0 0 0 0     Relevant past medical, surgical, family and social history reviewed and updated as indicated.  Interim medical history since our last visit reviewed. Allergies and medications reviewed and updated.  ROS: Per HPI unless specifically indicated above  History  Smoking status  . Never Smoker   Smokeless tobacco  . Not on file       Objective:    BP 151/73 mmHg  Pulse 57  Temp(Src) 97 F (36.1 C) (Oral)  Ht 5' (1.524 m)  Wt 199 lb 9.6 oz (90.538 kg)  BMI 38.98 kg/m2  Wt Readings from Last 3 Encounters:  11/18/15 199 lb 9.6 oz (90.538 kg)  11/04/15 195 lb 12.8 oz (88.814 kg)  10/01/15 200 lb (90.719 kg)     Gen: NAD, alert, cooperative with exam, NCAT EYES: EOMI, no scleral injection or icterus CV: NRRR, normal S1/S2, no murmur Resp: normal WOB Ext: No edema, warm Neuro: Alert and oriented, strength equal b/l UE and LE, coordination grossly normal MSK: pain on on lateral side of R hip with internal and external rotation of R hip. ROM similar  to L hip. No pain in groin with ROM. No TTP with palpation over greater trochanter     Assessment & Plan:    Itati was seen today for hip pain, likely related to bursitis though h/o OA in same hip, replaced 2009.  Diagnoses and all orders for this visit:  Essential hypertension Elevated today, diastolic fine. Pt to check regularly at home, let me know if elevated recheck next visit  Primary osteoarthritis of hip, unspecified laterality H/o hip replacement by [place] ortho  Obesity (BMI 30-39.9) Cont lifestyle changes  Chronic pain Followed by pain clinic, on chronic narcotics  Overactive bladder -     oxybutynin (DITROPAN) 5 MG tablet; Take 1 tablet (5 mg total) by mouth 2 (two) times daily.  Hip pain, right Symptoms seem more related to bursitis pain than OA given location and timing of symptoms. Very gentle stretching F/u with ortho -     DG HIP UNILAT W OR W/O PELVIS 2-3 VIEWS RIGHT; Future    Follow up plan: Return in about 3 months (around 02/18/2016).  [name], MD [facility] 11/18/2015, 10:12 AM

## 2015-11-18 NOTE — Patient Instructions (Addendum)
Let me know if your blood pressure is greater than 140 on top Or greater than 90 on bottom regularly  Trochanteric Bursitis You have hip pain due to trochanteric bursitis. Bursitis means that the sack near the outside of the hip is filled with fluid and inflamed. This sack is made up of protective soft tissue. The pain from trochanteric bursitis can be severe and keep you from sleep. It can radiate to the buttocks or down the outside of the thigh to the knee. The pain is almost always worse when rising from the seated or lying position and with walking. Pain can improve after you take a few steps. It happens more often in people with hip joint and lumbar spine problems, such as arthritis or previous surgery. Very rarely the trochanteric bursa can become infected, and antibiotics and/or surgery may be needed. Treatment often includes an injection of local anesthetic mixed with cortisone medicine. This medicine is injected into the area where it is most tender over the hip. Repeat injections may be necessary if the response to treatment is slow. You can apply ice packs over the tender area for 30 minutes every 2 hours for the next few days. Anti-inflammatory and/or narcotic pain medicine may also be helpful. Limit your activity for the next few days if the pain continues. See your caregiver in 5-10 days if you are not greatly improved.  SEEK IMMEDIATE MEDICAL CARE IF:  You develop severe pain, fever, or increased redness.  You have pain that radiates below the knee. EXERCISES STRETCHING EXERCISES - Trochanteric Bursitis  These exercises may help you when beginning to rehabilitate your injury. Your symptoms may resolve with or without further involvement from your physician, physical therapist, or athletic trainer. While completing these exercises, remember:   Restoring tissue flexibility helps normal motion to return to the joints. This allows healthier, less painful movement and activity.  An  effective stretch should be held for at least 30 seconds.  A stretch should never be painful. You should only feel a gentle lengthening or release in the stretched tissue. STRETCH - Iliotibial Band  On the floor or bed, lie on your side so your injured leg is on top. Bend your knee and grab your ankle.  Slowly bring your knee back so that your thigh is in line with your trunk. Keep your heel at your buttocks and gently arch your back so your head, shoulders and hips line up.  Slowly lower your leg so that your knee approaches the floor/bed until you feel a gentle stretch on the outside of your thigh. If you do not feel a stretch and your knee will not fall farther, place the heel of your opposite foot on top of your knee and pull your thigh down farther.  Hold this stretch for __________ seconds.  Repeat __________ times. Complete this exercise __________ times per day. STRETCH - Hamstrings, Supine   Lie on your back. Loop a belt or towel over the ball of your foot as shown.  Straighten your knee and slowly pull on the belt to raise your injured leg. Do not allow the knee to bend. Keep your opposite leg flat on the floor.  Raise the leg until you feel a gentle stretch behind your knee or thigh. Hold this position for __________ seconds.  Repeat __________ times. Complete this stretch __________ times per day. STRETCH - Quadriceps, Prone   Lie on your stomach on a firm surface, such as a bed or padded floor.  Bend your knee and grasp your ankle. If you are unable to reach your ankle or pant leg, use a belt around your foot to lengthen your reach.  Gently pull your heel toward your buttocks. Your knee should not slide out to the side. You should feel a stretch in the front of your thigh and/or knee.  Hold this position for __________ seconds.  Repeat __________ times. Complete this stretch __________ times per day. STRETCHING - Hip Flexors, Lunge Half kneel with your knee on the  floor and your opposite knee bent and directly over your ankle.  Keep good posture with your head over your shoulders. Tighten your buttocks to point your tailbone downward; this will prevent your back from arching too much.  You should feel a gentle stretch in the front of your thigh and/or hip. If you do not feel any resistance, slightly slide your opposite foot forward and then slowly lunge forward so your knee once again lines up over your ankle. Be sure your tailbone remains pointed downward.  Hold this stretch for __________ seconds.  Repeat __________ times. Complete this stretch __________ times per day. STRETCH - Adductors, Lunge  While standing, spread your legs.  Lean away from your injured leg by bending your opposite knee. You may rest your hands on your thigh for balance.  You should feel a stretch in your inner thigh. Hold for __________ seconds.  Repeat __________ times. Complete this exercise __________ times per day.   This information is not intended to replace advice given to you by your health care provider. Make sure you discuss any questions you have with your health care provider.   Document Released: 06/02/2004 Document Revised: 09/09/2014 Document Reviewed: 08/07/2008 Elsevier Interactive Patient Education [employer].

## 2015-11-20 ENCOUNTER — Other Ambulatory Visit: Payer: Self-pay | Admitting: Pediatrics

## 2015-11-20 DIAGNOSIS — N3281 Overactive bladder: Secondary | ICD-10-CM

## 2015-11-20 MED ORDER — OXYBUTYNIN CHLORIDE 5 MG PO TABS: 5.0000 mg | ORAL_TABLET | Freq: Two times a day (BID) | ORAL | Status: DC

## 2015-11-20 NOTE — Telephone Encounter (Signed)
Rx sent to [facility] and pt is aware.

## 2015-11-27 DIAGNOSIS — Z0100 Encounter for examination of eyes and vision without abnormal findings: Secondary | ICD-10-CM | POA: Diagnosis not present

## 2015-11-27 DIAGNOSIS — H5200 Hypermetropia, unspecified eye: Secondary | ICD-10-CM | POA: Diagnosis not present

## 2015-12-29 ENCOUNTER — Other Ambulatory Visit: Payer: Self-pay | Admitting: Family Medicine

## 2015-12-29 ENCOUNTER — Other Ambulatory Visit: Payer: Self-pay | Admitting: Pediatrics

## 2015-12-29 DIAGNOSIS — G8929 Other chronic pain: Secondary | ICD-10-CM

## 2016-01-07 DIAGNOSIS — Z96642 Presence of left artificial hip joint: Secondary | ICD-10-CM | POA: Diagnosis not present

## 2016-01-07 DIAGNOSIS — Z471 Aftercare following joint replacement surgery: Secondary | ICD-10-CM | POA: Diagnosis not present

## 2016-01-12 DIAGNOSIS — R69 Illness, unspecified: Secondary | ICD-10-CM | POA: Diagnosis not present

## 2016-01-12 DIAGNOSIS — Z79899 Other long term (current) drug therapy: Secondary | ICD-10-CM | POA: Diagnosis not present

## 2016-01-12 DIAGNOSIS — M7071 Other bursitis of hip, right hip: Secondary | ICD-10-CM | POA: Diagnosis not present

## 2016-01-12 DIAGNOSIS — M545 Low back pain: Secondary | ICD-10-CM | POA: Diagnosis not present

## 2016-01-12 DIAGNOSIS — M461 Sacroiliitis, not elsewhere classified: Secondary | ICD-10-CM | POA: Diagnosis not present

## 2016-01-18 ENCOUNTER — Ambulatory Visit: Payer: Medicare HMO | Admitting: Pediatrics

## 2016-01-20 DIAGNOSIS — M461 Sacroiliitis, not elsewhere classified: Secondary | ICD-10-CM | POA: Diagnosis not present

## 2016-02-08 ENCOUNTER — Encounter (HOSPITAL_COMMUNITY): Payer: Self-pay | Admitting: Emergency Medicine

## 2016-02-08 ENCOUNTER — Observation Stay (HOSPITAL_COMMUNITY): Payer: Medicare HMO

## 2016-02-08 ENCOUNTER — Inpatient Hospital Stay (HOSPITAL_COMMUNITY)
Admission: EM | Admit: 2016-02-08 | Discharge: 2016-02-11 | DRG: 872 | Disposition: A | Discharge status: Home / Self Care | Payer: Medicare HMO | Attending: Internal Medicine | Admitting: Internal Medicine

## 2016-02-08 DIAGNOSIS — Z6838 Body mass index (BMI) 38.0-38.9, adult: Secondary | ICD-10-CM | POA: Diagnosis not present

## 2016-02-08 DIAGNOSIS — Z882 Allergy status to sulfonamides status: Secondary | ICD-10-CM | POA: Diagnosis not present

## 2016-02-08 DIAGNOSIS — Z981 Arthrodesis status: Secondary | ICD-10-CM | POA: Diagnosis not present

## 2016-02-08 DIAGNOSIS — R69 Illness, unspecified: Secondary | ICD-10-CM | POA: Diagnosis not present

## 2016-02-08 DIAGNOSIS — N1330 Unspecified hydronephrosis: Secondary | ICD-10-CM | POA: Diagnosis not present

## 2016-02-08 DIAGNOSIS — M1990 Unspecified osteoarthritis, unspecified site: Secondary | ICD-10-CM | POA: Diagnosis not present

## 2016-02-08 DIAGNOSIS — N136 Pyonephrosis: Secondary | ICD-10-CM | POA: Diagnosis present

## 2016-02-08 DIAGNOSIS — Z800 Family history of malignant neoplasm of digestive organs: Secondary | ICD-10-CM

## 2016-02-08 DIAGNOSIS — N2889 Other specified disorders of kidney and ureter: Secondary | ICD-10-CM

## 2016-02-08 DIAGNOSIS — G629 Polyneuropathy, unspecified: Secondary | ICD-10-CM | POA: Diagnosis not present

## 2016-02-08 DIAGNOSIS — A419 Sepsis, unspecified organism: Secondary | ICD-10-CM | POA: Diagnosis not present

## 2016-02-08 DIAGNOSIS — F32A Depression, unspecified: Secondary | ICD-10-CM

## 2016-02-08 DIAGNOSIS — Z833 Family history of diabetes mellitus: Secondary | ICD-10-CM | POA: Diagnosis not present

## 2016-02-08 DIAGNOSIS — N10 Acute tubulo-interstitial nephritis: Secondary | ICD-10-CM | POA: Diagnosis not present

## 2016-02-08 DIAGNOSIS — Z87440 Personal history of urinary (tract) infections: Secondary | ICD-10-CM

## 2016-02-08 DIAGNOSIS — J40 Bronchitis, not specified as acute or chronic: Secondary | ICD-10-CM | POA: Diagnosis not present

## 2016-02-08 DIAGNOSIS — N12 Tubulo-interstitial nephritis, not specified as acute or chronic: Secondary | ICD-10-CM | POA: Diagnosis not present

## 2016-02-08 DIAGNOSIS — Z96651 Presence of right artificial knee joint: Secondary | ICD-10-CM | POA: Diagnosis present

## 2016-02-08 DIAGNOSIS — E785 Hyperlipidemia, unspecified: Secondary | ICD-10-CM | POA: Diagnosis not present

## 2016-02-08 DIAGNOSIS — N179 Acute kidney failure, unspecified: Secondary | ICD-10-CM

## 2016-02-08 DIAGNOSIS — I10 Essential (primary) hypertension: Secondary | ICD-10-CM | POA: Diagnosis present

## 2016-02-08 DIAGNOSIS — Z79899 Other long term (current) drug therapy: Secondary | ICD-10-CM | POA: Diagnosis not present

## 2016-02-08 DIAGNOSIS — E875 Hyperkalemia: Secondary | ICD-10-CM

## 2016-02-08 DIAGNOSIS — Z96643 Presence of artificial hip joint, bilateral: Secondary | ICD-10-CM | POA: Diagnosis present

## 2016-02-08 DIAGNOSIS — Z91018 Allergy to other foods: Secondary | ICD-10-CM | POA: Diagnosis not present

## 2016-02-08 DIAGNOSIS — Z888 Allergy status to other drugs, medicaments and biological substances status: Secondary | ICD-10-CM

## 2016-02-08 DIAGNOSIS — Z86010 Personal history of colonic polyps: Secondary | ICD-10-CM

## 2016-02-08 DIAGNOSIS — E669 Obesity, unspecified: Secondary | ICD-10-CM | POA: Diagnosis present

## 2016-02-08 DIAGNOSIS — F329 Major depressive disorder, single episode, unspecified: Secondary | ICD-10-CM | POA: Diagnosis present

## 2016-02-08 DIAGNOSIS — I959 Hypotension, unspecified: Secondary | ICD-10-CM

## 2016-02-08 LAB — COMPREHENSIVE METABOLIC PANEL
ALK PHOS: 62 U/L (ref 38–126)
ALT: 41 U/L (ref 14–54)
ANION GAP: 10 (ref 5–15)
AST: 62 U/L — ABNORMAL HIGH (ref 15–41)
Albumin: 3.8 g/dL (ref 3.5–5.0)
BILIRUBIN TOTAL: 1.6 mg/dL — AB (ref 0.3–1.2)
BUN: 14 mg/dL (ref 6–20)
CALCIUM: 9 mg/dL (ref 8.9–10.3)
CO2: 24 mmol/L (ref 22–32)
CREATININE: 1.31 mg/dL — AB (ref 0.44–1.00)
Chloride: 102 mmol/L (ref 101–111)
GFR, EST AFRICAN AMERICAN: 46 mL/min — AB (ref >60)
GFR, EST NON AFRICAN AMERICAN: 40 mL/min — AB (ref >60)
Glucose, Bld: 111 mg/dL — ABNORMAL HIGH (ref 65–99)
Potassium: 5.5 mmol/L — ABNORMAL HIGH (ref 3.5–5.1)
Sodium: 136 mmol/L (ref 135–145)
TOTAL PROTEIN: 6.4 g/dL — AB (ref 6.5–8.1)

## 2016-02-08 LAB — PROTIME-INR
INR: 1.15
Prothrombin Time: 14.7 seconds (ref 11.4–15.2)

## 2016-02-08 LAB — APTT: aPTT: 33 seconds (ref 24–36)

## 2016-02-08 LAB — URINALYSIS, ROUTINE W REFLEX MICROSCOPIC
BILIRUBIN URINE: NEGATIVE
Glucose, UA: NEGATIVE mg/dL
Ketones, ur: NEGATIVE mg/dL
NITRITE: POSITIVE — AB
SPECIFIC GRAVITY, URINE: 1.015 (ref 1.005–1.030)
pH: 6.5 (ref 5.0–8.0)

## 2016-02-08 LAB — CBC
HCT: 45.1 % (ref 36.0–46.0)
HEMOGLOBIN: 14.8 g/dL (ref 12.0–15.0)
MCH: 31.4 pg (ref 26.0–34.0)
MCHC: 32.8 g/dL (ref 30.0–36.0)
MCV: 95.8 fL (ref 78.0–100.0)
PLATELETS: 287 10*3/uL (ref 150–400)
RBC: 4.71 MIL/uL (ref 3.87–5.11)
RDW: 12.5 % (ref 11.5–15.5)
WBC: 20.7 10*3/uL — ABNORMAL HIGH (ref 4.0–10.5)

## 2016-02-08 LAB — URINE MICROSCOPIC-ADD ON

## 2016-02-08 LAB — CORTISOL: Cortisol, Plasma: 5.7 ug/dL

## 2016-02-08 LAB — LIPASE, BLOOD: Lipase: 32 U/L (ref 11–51)

## 2016-02-08 LAB — LACTIC ACID, PLASMA
LACTIC ACID, VENOUS: 3 mmol/L — AB (ref 0.5–1.9)
LACTIC ACID, VENOUS: 1.7 mmol/L (ref 0.5–1.9)
Lactic Acid, Venous: 1.4 mmol/L (ref 0.5–1.9)

## 2016-02-08 LAB — PROCALCITONIN: Procalcitonin: 0.36 ng/mL

## 2016-02-08 MED ORDER — PROMETHAZINE HCL 25 MG PO TABS: 12.5000 mg | ORAL_TABLET | Freq: Four times a day (QID) | ORAL | Status: DC | PRN

## 2016-02-08 MED ORDER — AMITRIPTYLINE HCL 25 MG PO TABS
25.0000 mg | ORAL_TABLET | Freq: Every day | ORAL | Status: DC
  Administered 2016-02-08 – 2016-02-10 (×3): 25 mg via ORAL
  Filled 2016-02-08 (×4): qty 1

## 2016-02-08 MED ORDER — SODIUM CHLORIDE 0.9 % IV BOLUS (SEPSIS)
500.0000 mL | Freq: Once | INTRAVENOUS | Status: AC
  Administered 2016-02-08: 500 mL via INTRAVENOUS

## 2016-02-08 MED ORDER — GABAPENTIN 300 MG PO CAPS
300.0000 mg | ORAL_CAPSULE | Freq: Every day | ORAL | Status: DC
  Administered 2016-02-08 – 2016-02-10 (×3): 300 mg via ORAL
  Filled 2016-02-08 (×3): qty 1

## 2016-02-08 MED ORDER — ACETAMINOPHEN 650 MG RE SUPP: 650.0000 mg | Freq: Four times a day (QID) | RECTAL | Status: DC | PRN

## 2016-02-08 MED ORDER — PHENAZOPYRIDINE HCL 100 MG PO TABS: 200.0000 mg | ORAL_TABLET | Freq: Three times a day (TID) | ORAL | Status: DC | PRN

## 2016-02-08 MED ORDER — HYDRALAZINE HCL 20 MG/ML IJ SOLN: 5.0000 mg | INTRAMUSCULAR | Status: DC | PRN

## 2016-02-08 MED ORDER — HYDROCODONE-ACETAMINOPHEN 5-325 MG PO TABS
1.0000 | ORAL_TABLET | Freq: Four times a day (QID) | ORAL | Status: DC | PRN
  Administered 2016-02-09 – 2016-02-11 (×7): 1 via ORAL
  Filled 2016-02-08 (×7): qty 1

## 2016-02-08 MED ORDER — CEFTRIAXONE SODIUM 1 G IJ SOLR
1.0000 g | INTRAMUSCULAR | Status: DC
  Administered 2016-02-09 – 2016-02-10 (×2): 1 g via INTRAVENOUS
  Filled 2016-02-08 (×3): qty 10

## 2016-02-08 MED ORDER — DEXTROSE 5 % IV SOLN
1.0000 g | Freq: Once | INTRAVENOUS | Status: AC
  Administered 2016-02-08: 1 g via INTRAVENOUS
  Filled 2016-02-08: qty 10

## 2016-02-08 MED ORDER — HEPARIN SODIUM (PORCINE) 5000 UNIT/ML IJ SOLN
5000.0000 [IU] | Freq: Three times a day (TID) | INTRAMUSCULAR | Status: DC
  Administered 2016-02-08 – 2016-02-11 (×8): 5000 [IU] via SUBCUTANEOUS
  Filled 2016-02-08 (×8): qty 1

## 2016-02-08 MED ORDER — ACETAMINOPHEN 325 MG PO TABS: 650.0000 mg | ORAL_TABLET | Freq: Four times a day (QID) | ORAL | Status: DC | PRN

## 2016-02-08 MED ORDER — SODIUM CHLORIDE 0.9 % IV BOLUS (SEPSIS): 2000.0000 mL | Freq: Once | INTRAVENOUS | Status: DC

## 2016-02-08 MED ORDER — SODIUM CHLORIDE 0.9 % IV SOLN
INTRAVENOUS | Status: DC
  Administered 2016-02-08 (×2): via INTRAVENOUS
  Administered 2016-02-09: 1000 mL via INTRAVENOUS
  Administered 2016-02-09 – 2016-02-10 (×2): via INTRAVENOUS

## 2016-02-08 MED ORDER — SODIUM CHLORIDE 0.9% FLUSH
3.0000 mL | Freq: Two times a day (BID) | INTRAVENOUS | Status: DC
  Administered 2016-02-08 – 2016-02-10 (×2): 3 mL via INTRAVENOUS

## 2016-02-08 NOTE — ED Provider Notes (Signed)
[facility] DEPT Provider Note   CSN: [id number] Arrival date & time: 02/08/16  1038     History   Chief Complaint Chief Complaint  Patient presents with  . Abdominal Pain  . Fatigue    HPI [name] is a 70 y.o. female.  [name] is a 70 y.o. female with h/o arthritis, HLD, HTN, obesity presents to ED with complaint of generalized fatigue, dysuria, and abdominal pain. Onset of symptoms started this morning with generalized fatigue. She had low grade fever at home of 99.9. She has associated dysuria, LUQ abdominal pain, back pain, nausea, and lightheadedness. She denies vomiting, hematuria, shortness of breath, chest pain, trouble swallowing, changes in vision, numbness, weakness, headache, LOC. No treatments tx PTA.        Past Medical History:  Diagnosis Date  . Arthritis   . Colon polyp, hyperplastic 04/27/2012   Colonoscopy 2010.  No adenomatous polyps   . Endometrioid adenocarcinoma   . History of endometrial cancer s/p TAH BSO June2013 06/15/2011   Grade 1 endometrioid adenocarcinoma with focal superficial myometrial invasion of 0.1 cm with a myometrium of 1.8 cm or approximately 7%. She had a 5-cm tumor. No lymphovascular space Involvement.    . Hyperlipidemia   . Hypertension   . Mass of in fold of jejunum s/p SB resection 07/19/2012 07/20/2012  . Obesity (BMI 30-39.9) 04/27/2012  . 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 SBO (small bowel obstruction) - recurrent 04/27/2012  . Ureteral adhesion, left 2010   s/p ureterolysis & stenting Dr. Larwance Sachs now  . UTI (urinary tract infection)     Patient Active Problem List   Diagnosis Date Noted  . Pyelonephritis 02/08/2016  . OA (osteoarthritis) of hip 07/04/2014  . 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 Pseudoarthrosis L3- L5 12/17/2012  . Nocturnal leg cramps 11/13/2012  . HLD (hyperlipidemia)  08/10/2012  . Recurrent ventral incisional hernia s/p lap repair 04/23/2013 04/27/2012  . Obesity (BMI 30-39.9) 04/27/2012  . Chronic pain 04/27/2012  . Hypokalemia 04/27/2012  . Hypertension   . History of endometrial cancer s/p TAH BSO June2013 06/15/2011    Past Surgical History:  Procedure Laterality Date  . ABDOMINAL HYSTERECTOMY  11/02/10   TAH, BSO, lysis of adhesions for endometrial cancer  . [place], 2009   anterior L2-3, L3-4 arthrodesis  . BACK SURGERY  12/17/2012   Spinal fusion and repair   . BLADDER SUSPENSION  2003  . CATARACT EXTRACTION, BILATERAL  2003  . EYE SURGERY    . FOOT SURGERY  06/2010   left foot surgery  . HERNIA REPAIR  07/19/12   lap [place] repair  . INCISIONAL HERNIA REPAIR  11/02/10  . INSERTION OF MESH N/A 04/23/2013   Procedure: INSERTION OF MESH;  Surgeon: Adin Hector, MD;  Location: [facility];  Service: General;  Laterality: N/A;  . JOINT REPLACEMENT  09/2009   Right total knee  . KIDNEY SURGERY  2010   Left kidney  . LAPAROSCOPIC LYSIS OF ADHESIONS N/A 04/23/2013   Procedure: LAPAROSCOPIC LYSIS OF ADHESIONS;  Surgeon: Adin Hector, MD;  Location: [facility];  Service: General;  Laterality: N/A;  . TOTAL HIP ARTHROPLASTY  01/2010   Right total hip  . TOTAL HIP ARTHROPLASTY Left 07/04/2014   Procedure: LEFT TOTAL HIP ARTHROPLASTY ANTERIOR APPROACH;  Surgeon: Gearlean Alf, MD;  Location: [facility];  Service: Orthopedics;  Laterality: Left;  . TOTAL KNEE ARTHROPLASTY     right  . URETEROLYSIS  2010   lap/open left ureterolysis & omental flap, ureter stent  . VENTRAL HERNIA REPAIR N/A 07/19/2012   Procedure: LAPAROSCOPIC LYSIS OF ADHESIONS, SMALL BOWEL RESECTION, SEROSAL REPAIR, PRIMARY VENTRAL HERNIA REPAIR;  Surgeon: Adin Hector, MD;  [facility];  Service: General;  Laterality: N/A;  . VENTRAL HERNIA REPAIR N/A 04/23/2013   Procedure: LAPAROSCOPIC exploration and repair of hernia in abdominal VENTRAL wall  HERNIA;  Surgeon:  Adin Hector, MD;  [facility];  Service: General;  Laterality: N/A;    OB History    No data available       Home Medications    Prior to Admission medications   Medication Sig Start Date End Date Taking? Authorizing Provider  amitriptyline (ELAVIL) 25 MG tablet TAKE ONE TABLET BY MOUTH ONCE DAILY AT BEDTIME 12/30/15  Yes [name], MD  docusate sodium (COLACE) 100 MG capsule Take 100 mg by mouth at bedtime. Reported on 04/29/2015   Yes Historical Provider, MD  gabapentin (NEURONTIN) 300 MG capsule Take 1 capsule (300 mg total) by mouth at bedtime. 07/16/15  Yes [name], MD  HYDROcodone-acetaminophen (NORCO/VICODIN) 5-325 MG tablet  11/16/15  Yes Historical Provider, MD  lisinopril-hydrochlorothiazide (PRINZIDE,ZESTORETIC) 10-12.5 MG tablet Take 1 tablet by mouth daily. 07/16/15  Yes [name], MD  meloxicam (MOBIC) 15 MG tablet TAKE 1 TABLET DAILY 12/29/15  Yes [name], MD  metroNIDAZOLE (METROGEL) 1 % gel Apply topically daily. 07/10/13  Yes Lysbeth Penner, FNP  nystatin cream (MYCOSTATIN) Apply 1 application topically 2 (two) times daily. 02/27/14  Yes Nancy Marus, MD  oxybutynin (DITROPAN) 5 MG tablet Take 1 tablet (5 mg total) by mouth 2 (two) times daily. Patient not taking: Reported on 02/08/2016 11/20/15   [name], MD    Family History Family History  Problem Relation Age of Onset  . Diabetes Mother   . Cancer Mother     lung  . Diabetes Father   . Cancer Father     liver  . Colon cancer Sister   . Diabetes Sister   . Cancer Sister     colon    Social History Social History  Substance Use Topics  . Smoking status: Never Smoker  . Smokeless tobacco: Never Used  . Alcohol use No     Allergies   Cashew nut oil; Sulfamethoxazole; and Zofran   Review of Systems Review of Systems  Constitutional: Positive for fatigue and fever ( low grade at 99.9). Negative for chills and diaphoresis.  HENT: Negative for trouble  swallowing.   Eyes: Negative for visual disturbance.  Respiratory: Negative for shortness of breath.   Cardiovascular: Negative for chest pain.  Gastrointestinal: Positive for abdominal pain ( LUQ) and nausea. Negative for blood in stool, constipation, diarrhea and vomiting.  Genitourinary: Positive for dysuria. Negative for hematuria, pelvic pain, vaginal discharge and vaginal pain.  Musculoskeletal: Positive for back pain.  Skin: Negative for rash.  Neurological: Positive for light-headedness. Negative for syncope, weakness, numbness and headaches.     Physical Exam Updated Vital Signs BP (!) 78/48 (BP Location: Left Arm)   Pulse 68   Temp 98.4 F (36.9 C) (Oral)   Resp 17   Ht 5' (1.524 m)   Wt 88.5 kg   SpO2 92%   BMI 38.08 kg/m   Physical Exam  Constitutional: She appears well-developed and well-nourished. No  distress.  HENT:  Head: Normocephalic and atraumatic.  Mouth/Throat: Oropharynx is clear and moist. No oropharyngeal exudate.  Eyes: Conjunctivae and EOM are normal. Pupils are equal, round, and reactive to light. Right eye exhibits no discharge. Left eye exhibits no discharge. No scleral icterus.  Neck: Normal range of motion and phonation normal. Neck supple. No neck rigidity. Normal range of motion present.  Cardiovascular: Normal rate, regular rhythm, normal heart sounds and intact distal pulses.   No murmur heard. Pulmonary/Chest: Effort normal and breath sounds normal. No stridor. No respiratory distress. She has no wheezes. She has no rales.  Abdominal: Soft. Bowel sounds are normal. She exhibits no distension. There is tenderness in the left upper quadrant. There is no rigidity, no rebound, no guarding and no CVA tenderness.  Mild TTP of LUQ. No guarding or rigidity. No peritoneal signs.   Musculoskeletal: Normal range of motion.  Lymphadenopathy:    She has no cervical adenopathy.  Neurological: She is alert. She is not disoriented. Coordination and gait  normal. GCS eye subscore is 4. GCS verbal subscore is 5. GCS motor subscore is 6.  Skin: Skin is warm and dry. She is not diaphoretic.  Psychiatric: She has a normal mood and affect. Her behavior is normal.     ED Treatments / Results  Labs (all labs ordered are listed, but only abnormal results are displayed) Labs Reviewed  COMPREHENSIVE METABOLIC PANEL - Abnormal; Notable for the following:       Result Value   Potassium 5.5 (*)    Glucose, Bld 111 (*)    Creatinine, Ser 1.31 (*)    Total Protein 6.4 (*)    AST 62 (*)    Total Bilirubin 1.6 (*)    GFR calc non Af Amer 40 (*)    GFR calc Af Amer 46 (*)    All other components within normal limits  CBC - Abnormal; Notable for the following:    WBC 20.7 (*)    All other components within normal limits  URINALYSIS, ROUTINE W REFLEX MICROSCOPIC (NOT AT [facility]) - Abnormal; Notable for the following:    APPearance TURBID (*)    Hgb urine dipstick LARGE (*)    Protein, ur >300 (*)    Nitrite POSITIVE (*)    Leukocytes, UA LARGE (*)    All other components within normal limits  URINE MICROSCOPIC-ADD ON - Abnormal; Notable for the following:    Squamous Epithelial / LPF 0-5 (*)    Bacteria, UA FEW (*)    All other components within normal limits  URINE CULTURE  LIPASE, BLOOD    EKG  EKG Interpretation None       Radiology No results found.  Procedures Procedures (including critical care time)  Medications Ordered in ED Medications  sodium chloride 0.9 % bolus 500 mL (not administered)  cefTRIAXone (ROCEPHIN) 1 g in dextrose 5 % 50 mL IVPB (1 g Intravenous New Bag/Given 02/08/16 1409)     Initial Impression / Assessment and Plan / ED Course  I have reviewed the triage vital signs and the nursing notes.  Pertinent labs & imaging results that were available during my care of the patient were reviewed by me and considered in my medical decision making (see chart for details).  Clinical Course    Patient presents to  ED with complaint of generalized fatigue, dysuria, and LUQ abdominal pain onset today. Patient is afebrile and non-toxic appearing in NAD. Vital signs remarkable for hypotension at  78/48,  fluid bolus initiated with improvement in pressures to 104/53. Physical exam remarkable for mild TTP of LUQ without guarding, rigidity, or peritoneal signs. Patient with UTI. Given abdominal symptoms and back pain ?pyelonephritis. Suspect leukocytosis secondary to pyelonephritis. AKI, suspect may be secondary to pyelonephritis and dehydration given blood pressures. Mild hyperkalemia will check EKG. EKG shows no peaked t-waves. IV ABX initiated. Considering age, blood pressures, AKI, and pyelonephritis will consult for observation admission for further management.   2:42 PM: Spoke with Dr. Marily Memos of [place]. Agree to admit patient for further management of pyelonephritis, AKI,  and hypotension.Thank you for your time and continued care of the patient.     Final Clinical Impressions(s) / ED Diagnoses   Final diagnoses:  Pyelonephritis  Hypotension, unspecified hypotension type  AKI (acute kidney injury) [facility])    New Prescriptions New Prescriptions   No medications on file     Roxanna Mew, PA-C 02/08/16 1445    Nat Christen, MD 02/09/16 1241

## 2016-02-08 NOTE — H&P (Addendum)
History and Physical    [name] [id number] DOB: 1945-08-19 DOA: 02/08/2016  PCP: [name], MD Patient coming from: home  Chief Complaint: dysuria and flank pain  HPI: [name] is a 70 y.o. female presenting with acute onset dysuria, fatigue and LUQ/Flank pain. Temperature home 99.9. Associated with back pain, nausea and lightheadedness. Denies any chest pain, LOC, neck stiffness, headache, chest pain, shortness of breath, palpations, cough, rash. Has not tried anything for symptoms. Getting worse.  Patient states she has a history getting complex urinary tract infections and has a history of possible ureteral injury during abdominal surgery the patient is unclear on these details. Per review of patient's record she had a ureteral adhesion on the left and is status post ureteral lysis with stenting in 2010.  ED Course: Objective findings outlined below. Started on ceftriaxone and given a 500 mL normal saline bolus  Review of Systems: As per HPI otherwise 10 point review of systems negative.   Ambulatory Status: No restrictions  Past Medical History:  Diagnosis Date  . Arthritis   . Colon polyp, hyperplastic 04/27/2012   Colonoscopy 2010.  No adenomatous polyps   . Endometrioid adenocarcinoma   . History of endometrial cancer s/p TAH BSO June2013 06/15/2011   Grade 1 endometrioid adenocarcinoma with focal superficial myometrial invasion of 0.1 cm with a myometrium of 1.8 cm or approximately 7%. She had a 5-cm tumor. No lymphovascular space Involvement.    . Hyperlipidemia   . Hypertension   . Mass of in fold of jejunum s/p SB resection 07/19/2012 07/20/2012  . Obesity (BMI 30-39.9) 04/27/2012  . 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 SBO (small bowel obstruction) - recurrent 04/27/2012  . Ureteral adhesion, left 2010   s/p ureterolysis & stenting Dr. Larwance Sachs now  . UTI (urinary tract infection)       Past Surgical History:  Procedure Laterality Date  . ABDOMINAL HYSTERECTOMY  11/02/10   TAH, BSO, lysis of adhesions for endometrial cancer  . [place], 2009   anterior L2-3, L3-4 arthrodesis  . BACK SURGERY  12/17/2012   Spinal fusion and repair   . BLADDER SUSPENSION  2003  . CATARACT EXTRACTION, BILATERAL  2003  . EYE SURGERY    . FOOT SURGERY  06/2010   left foot surgery  . HERNIA REPAIR  07/19/12   lap [place] repair  . INCISIONAL HERNIA REPAIR  11/02/10  . INSERTION OF MESH N/A 04/23/2013   Procedure: INSERTION OF MESH;  Surgeon: Adin Hector, MD;  [facility]: [facility];  Service: General;  Laterality: N/A;  . JOINT REPLACEMENT  09/2009   Right total knee  . KIDNEY SURGERY  2010   Left kidney  . LAPAROSCOPIC LYSIS OF ADHESIONS N/A 04/23/2013   Procedure: LAPAROSCOPIC LYSIS OF ADHESIONS;  Surgeon: Adin Hector, MD;  [facility];  Service: General;  Laterality: N/A;  . TOTAL HIP ARTHROPLASTY  01/2010   Right total hip  . TOTAL HIP ARTHROPLASTY Left 07/04/2014   Procedure: LEFT TOTAL HIP ARTHROPLASTY ANTERIOR APPROACH;  Surgeon: Gearlean Alf, MD;  Location: [facility];  Service: Orthopedics;  Laterality: Left;  . TOTAL KNEE ARTHROPLASTY     right  . URETEROLYSIS  2010   lap/open left ureterolysis & omental flap, ureter stent  . VENTRAL HERNIA REPAIR N/A 07/19/2012   Procedure: LAPAROSCOPIC LYSIS OF ADHESIONS, SMALL BOWEL RESECTION, SEROSAL REPAIR, PRIMARY VENTRAL HERNIA REPAIR;  Surgeon: Adin Hector, MD;  [facility];  Service: General;  Laterality: N/A;  . VENTRAL HERNIA REPAIR N/A 04/23/2013   Procedure: LAPAROSCOPIC exploration and repair of hernia in abdominal VENTRAL wall  HERNIA;  Surgeon: Adin Hector, MD;  [facility];  Service: General;  Laterality: N/A;    Social History   Social History  . Marital status: Widowed    Spouse name: N/A  . Number of children: N/A  . Years of education: N/A   Occupational History  . Not on file.    Social History Main Topics  . Smoking status: Never Smoker  . Smokeless tobacco: Never Used  . Alcohol use No  . Drug use: No  . Sexual activity: No   Other Topics Concern  . Not on file   Social History Narrative  . No narrative on file    Allergies  Allergen Reactions  . Cashew Nut Oil Hives  . Sulfamethoxazole     Mouth sores  . Zofran Hives and Nausea Only    Family History  Problem Relation Age of Onset  . Diabetes Mother   . Cancer Mother     lung  . Diabetes Father   . Cancer Father     liver  . Colon cancer Sister   . Diabetes Sister   . Cancer Sister     colon    Prior to Admission medications   Medication Sig Start Date End Date Taking? Authorizing Provider  amitriptyline (ELAVIL) 25 MG tablet TAKE ONE TABLET BY MOUTH ONCE DAILY AT BEDTIME 12/30/15  Yes [name], MD  docusate sodium (COLACE) 100 MG capsule Take 100 mg by mouth at bedtime. Reported on 04/29/2015   Yes Historical Provider, MD  gabapentin (NEURONTIN) 300 MG capsule Take 1 capsule (300 mg total) by mouth at bedtime. 07/16/15  Yes [name], MD  HYDROcodone-acetaminophen (NORCO/VICODIN) 5-325 MG tablet  11/16/15  Yes Historical Provider, MD  lisinopril-hydrochlorothiazide (PRINZIDE,ZESTORETIC) 10-12.5 MG tablet Take 1 tablet by mouth daily. 07/16/15  Yes [name], MD  meloxicam (MOBIC) 15 MG tablet TAKE 1 TABLET DAILY 12/29/15  Yes [name], MD  metroNIDAZOLE (METROGEL) 1 % gel Apply topically daily. 07/10/13  Yes Lysbeth Penner, FNP  nystatin cream (MYCOSTATIN) Apply 1 application topically 2 (two) times daily. 02/27/14  Yes Nancy Marus, MD  oxybutynin (DITROPAN) 5 MG tablet Take 1 tablet (5 mg total) by mouth 2 (two) times daily. Patient not taking: Reported on 02/08/2016 11/20/15   [name], MD    Physical Exam: Vitals:   02/08/16 1315 02/08/16 1400 02/08/16 1532 02/08/16 1619  BP: 95/55 (!) 104/53 119/56 135/64  Pulse: 64 60 60 69  Resp: 15 21 17 17   Temp:       TempSrc:      SpO2: 93% 98% 97% 100%  Weight:      Height:         General:  Appears calm and comfortable Eyes:  PERRL, EOMI, normal lids, iris ENT:  grossly normal hearing, lips & tongue, mmm Neck:  no LAD, masses or thyromegaly Cardiovascular:  RRR, III/VI systolic murmur, trace bilat LE edema Respiratory:  CTA bilaterally, no w/r/r. Normal respiratory effort. Abdomen:  soft, ntnd, NABS Skin:  no rash or induration seen on limited exam Musculoskeletal: L CVA tenderness, grossly, normal tone BUE/BLE, good ROM, no bony abnormality Psychiatric:  grossly normal mood and affect, speech fluent and appropriate, AOx3 Neurologic:  CN 2-12 grossly  intact, moves all extremities in coordinated fashion, sensation intact  Labs on Admission: I have personally reviewed following labs and imaging studies  CBC:  Recent Labs Lab 02/08/16 1219  WBC 20.7*  HGB 14.8  HCT 45.1  MCV 95.8  PLT [id number]   Basic Metabolic Panel:  Recent Labs Lab 02/08/16 1219  NA 136  K 5.5*  CL 102  CO2 24  GLUCOSE 111*  BUN 14  CREATININE 1.31*  CALCIUM 9.0   GFR: Estimated Creatinine Clearance: 39 mL/min (by C-G formula based on SCr of 1.31 mg/dL (H)). Liver Function Tests:  Recent Labs Lab 02/08/16 1219  AST 62*  ALT 41  ALKPHOS 62  BILITOT 1.6*  PROT 6.4*  ALBUMIN 3.8    Recent Labs Lab 02/08/16 1219  LIPASE 32   No results for input(s): AMMONIA in the last 168 hours. Coagulation Profile: No results for input(s): INR, PROTIME in the last 168 hours. Cardiac Enzymes: No results for input(s): CKTOTAL, CKMB, CKMBINDEX, TROPONINI in the last 168 hours. BNP (last 3 results) No results for input(s): PROBNP in the last 8760 hours. HbA1C: No results for input(s): HGBA1C in the last 72 hours. CBG: No results for input(s): GLUCAP in the last 168 hours. Lipid Profile: No results for input(s): CHOL, HDL, LDLCALC, TRIG, CHOLHDL, LDLDIRECT in the last 72 hours. Thyroid Function Tests: No  results for input(s): TSH, T4TOTAL, FREET4, T3FREE, THYROIDAB in the last 72 hours. Anemia Panel: No results for input(s): VITAMINB12, FOLATE, FERRITIN, TIBC, IRON, RETICCTPCT in the last 72 hours. Urine analysis:    Component Value Date/Time   COLORURINE YELLOW 02/08/2016 1211   APPEARANCEUR TURBID (A) 02/08/2016 1211   APPEARANCEUR Clear 10/01/2015 1233   LABSPEC 1.015 02/08/2016 1211   PHURINE 6.5 02/08/2016 1211   GLUCOSEU NEGATIVE 02/08/2016 1211   HGBUR LARGE (A) 02/08/2016 1211   BILIRUBINUR NEGATIVE 02/08/2016 1211   BILIRUBINUR Negative 10/01/2015 1233   KETONESUR NEGATIVE 02/08/2016 1211   PROTEINUR >300 (A) 02/08/2016 1211   UROBILINOGEN negative 09/30/2014 1049   UROBILINOGEN 0.2 07/07/2014 0537   NITRITE POSITIVE (A) 02/08/2016 1211   LEUKOCYTESUR LARGE (A) 02/08/2016 1211   LEUKOCYTESUR Negative 10/01/2015 1233    Creatinine Clearance: Estimated Creatinine Clearance: 39 mL/min (by C-G formula based on SCr of 1.31 mg/dL (H)).  Sepsis Labs: @LABRCNTIP (procalcitonin:4,lacticidven:4) )No results found for this or any previous visit (from the past 240 hour(s)).   Radiological Exams on Admission: No results found.  EKG: Independently reviewed.  Assessment/Plan Active Problems:   Pyelonephritis   Sepsis ([place])   Peripheral neuropathy ([facility])   Hypotension   Depression   Sepsis/Pyelonephritis: Suspect urinary tract etiology given patient's complex medical history of left ureteral lysis with stenting in 2010 with presentation of hypotension, tachypnea, frankly infected urine, WBC 20.7 and AK I. - sepsis protocol - IVF - CTX - f/u UCX, BCX (unfortunately [place] obtained after ABX given) - Abd CT given pts urologic surgical hardware history  - pyridium - Hold diptropan  AKI: Cr 1.36. Baseline 0.8. Likely from sepsis, hypoperfusion and pyelo.  - IVF - BMET in am  HyperK: mild at 5.5. EKG normal. Likely from sepsis and aki.  - IVF - BMET in am  Peripheral  Neuropathy: - continue Neurontin  [id number] from sepsis. Responded well after 500cc bolus and ABX - Hold home lisinopril, HCTZ,  - Hydralazine PRN  Depression: - continue Elavil   DVT prophylaxis: Hep  Code Status: full  Family Communication: none  Disposition Plan: pending improvement  Consults called: none  Admission status: obs    Sabrine Patchen J MD [facility]  If 7PM-7AM, please contact night-coverage [email or web address] Password [email or web address]  02/08/2016, 4:59 PM

## 2016-02-08 NOTE — ED Notes (Signed)
Pt ambulated to restroom, pt tolerated well.  

## 2016-02-08 NOTE — ED Notes (Signed)
Attempted report 

## 2016-02-08 NOTE — Progress Notes (Signed)
Patient trasfered from ED to [phone] via stretcher; alert and oriented x 4; no complaints of pain; IV  in LFA  fluids running @100cc /hr.  Orient patient to room and unit;  gave patient care guide; instructed how to use the call bell and  fall risk precautions. Will continue to monitor the patient.

## 2016-02-08 NOTE — Progress Notes (Addendum)
Pharmacy Antibiotic Note  [name] is a 70 y.o. female admitted on 02/08/2016 with UTI.  Pharmacy has been consulted for ceftriaxone dosing.  Patient received a dose of ceftriaxone at 1400 this afternoon.  Plan: Ceftriaxone 1g IV q24h Follow c/s, clinical progression, LOT  Height: 5' (152.4 cm) Weight: 195 lb (88.5 kg) IBW/kg (Calculated) : 45.5  Temp (24hrs), Avg:98.4 F (36.9 C), Min:98.4 F (36.9 C), Max:98.4 F (36.9 C)   Recent Labs Lab 02/08/16 1219  WBC 20.7*  CREATININE 1.31*    Estimated Creatinine Clearance: 39 mL/min (by C-G formula based on SCr of 1.31 mg/dL (H)).    Allergies  Allergen Reactions  . Cashew Nut Oil Hives  . Sulfamethoxazole     Mouth sores  . Zofran Hives and Nausea Only    Antimicrobials this admission: Ceftriaxone 10/2 >>   Dose adjustments this admission: n/a  Microbiology results: 10/2 BCx: sent 10/2 UCx: sent   Thank you for allowing pharmacy to be a part of this patient's care.  Kilah Drahos D. Andros Channing, PharmD, BCPS Clinical Pharmacist Pager: [phone] 02/08/2016 5:04 PM

## 2016-02-08 NOTE — ED Notes (Signed)
1L normal Saline started,

## 2016-02-08 NOTE — ED Triage Notes (Signed)
Pt comes in to ED w/ c/o of LUQ abdominal pain that radiates to the side rated 5/10. States it "hurts to pee" but no changes in bowels or urine otherwise. Pt states she is extremely sleepy and feels weaker than baseline. Pt confirms nausea but denies emesis or diarrhea. Denies SOB, and chest pain. Pt AOx4. NAD noted.

## 2016-02-08 NOTE — Progress Notes (Signed)
CRITICAL VALUE ALERT  Critical value received:  Lactic Acid=3.0  Date of notification:  02/08/2016  Time of notification:  19:19  Critical value read back:Yes.    Nurse who received alert:  Alphonsus Sias  MD notified (1st page):  Dr. Hilbert Bible  Time of first page:  19:20  MD notified (2nd page):  Time of second page:  Responding MD:  Schorr, NP  Time MD responded: 1935 Ordered NS 500 cc bolus

## 2016-02-08 NOTE — ED Notes (Signed)
Pt's BP: 78/48, informed Ashley-PA.

## 2016-02-08 NOTE — ED Notes (Signed)
Pt. Given food/drink with EDP approval

## 2016-02-08 NOTE — ED Notes (Signed)
MD aware of Patient BP. PA at bedside.

## 2016-02-08 NOTE — ED Notes (Signed)
Phlebotomy  getting blood.

## 2016-02-09 ENCOUNTER — Encounter (HOSPITAL_COMMUNITY): Payer: Self-pay | Admitting: General Practice

## 2016-02-09 DIAGNOSIS — R69 Illness, unspecified: Secondary | ICD-10-CM | POA: Diagnosis not present

## 2016-02-09 DIAGNOSIS — A419 Sepsis, unspecified organism: Secondary | ICD-10-CM | POA: Diagnosis not present

## 2016-02-09 DIAGNOSIS — M1990 Unspecified osteoarthritis, unspecified site: Secondary | ICD-10-CM | POA: Diagnosis not present

## 2016-02-09 DIAGNOSIS — N12 Tubulo-interstitial nephritis, not specified as acute or chronic: Secondary | ICD-10-CM | POA: Diagnosis not present

## 2016-02-09 DIAGNOSIS — E875 Hyperkalemia: Secondary | ICD-10-CM | POA: Diagnosis not present

## 2016-02-09 DIAGNOSIS — I10 Essential (primary) hypertension: Secondary | ICD-10-CM | POA: Diagnosis not present

## 2016-02-09 DIAGNOSIS — N136 Pyonephrosis: Secondary | ICD-10-CM | POA: Diagnosis not present

## 2016-02-09 DIAGNOSIS — N179 Acute kidney failure, unspecified: Secondary | ICD-10-CM

## 2016-02-09 DIAGNOSIS — E785 Hyperlipidemia, unspecified: Secondary | ICD-10-CM | POA: Diagnosis not present

## 2016-02-09 DIAGNOSIS — G629 Polyneuropathy, unspecified: Secondary | ICD-10-CM | POA: Diagnosis not present

## 2016-02-09 LAB — URINE CULTURE

## 2016-02-09 LAB — COMPREHENSIVE METABOLIC PANEL
ALK PHOS: 58 U/L (ref 38–126)
ALT: 39 U/L (ref 14–54)
AST: 40 U/L (ref 15–41)
Albumin: 2.8 g/dL — ABNORMAL LOW (ref 3.5–5.0)
Anion gap: 4 — ABNORMAL LOW (ref 5–15)
BILIRUBIN TOTAL: 0.7 mg/dL (ref 0.3–1.2)
BUN: 13 mg/dL (ref 6–20)
CHLORIDE: 108 mmol/L (ref 101–111)
CO2: 26 mmol/L (ref 22–32)
CREATININE: 0.83 mg/dL (ref 0.44–1.00)
Calcium: 8.3 mg/dL — ABNORMAL LOW (ref 8.9–10.3)
GFR calc Af Amer: >60 mL/min (ref >60)
Glucose, Bld: 109 mg/dL — ABNORMAL HIGH (ref 65–99)
Potassium: 3.7 mmol/L (ref 3.5–5.1)
SODIUM: 138 mmol/L (ref 135–145)
TOTAL PROTEIN: 5.1 g/dL — AB (ref 6.5–8.1)

## 2016-02-09 LAB — CBC
HCT: 36 % (ref 36.0–46.0)
Hemoglobin: 11.8 g/dL — ABNORMAL LOW (ref 12.0–15.0)
MCH: 31.5 pg (ref 26.0–34.0)
MCHC: 32.8 g/dL (ref 30.0–36.0)
MCV: 96 fL (ref 78.0–100.0)
PLATELETS: 231 10*3/uL (ref 150–400)
RBC: 3.75 MIL/uL — ABNORMAL LOW (ref 3.87–5.11)
RDW: 12.9 % (ref 11.5–15.5)
WBC: 12.3 10*3/uL — AB (ref 4.0–10.5)

## 2016-02-09 MED ORDER — INFLUENZA VAC SPLIT QUAD 0.5 ML IM SUSY: 0.5000 mL | PREFILLED_SYRINGE | INTRAMUSCULAR | Status: DC

## 2016-02-09 MED ORDER — LISINOPRIL 10 MG PO TABS
10.0000 mg | ORAL_TABLET | Freq: Every day | ORAL | Status: DC
  Administered 2016-02-09 – 2016-02-11 (×3): 10 mg via ORAL
  Filled 2016-02-09 (×3): qty 1

## 2016-02-09 NOTE — Progress Notes (Signed)
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 Marijean Bravo and in 2014, admitted to the medicine service on 02/08/2016 when she presented with complaints of left-sided flank pain associate with dysuria. On presentation she was found to have white count of 20,700 with a lactic acid of 3.0. She was further worked up with a CT scan of abdomen and pelvis that revealed prominent left-sided hydronephrosis. Radiology also reported obstruction at the ureteropelvic junction likely due to stricture. She was started on empiric IV antibiotic therapy with ceftriaxone. I discussed case with Dr. Tresa Moore of urology to repeat scans and felt that hydronephrosis was stable. Given clinical improvement with resolution to acute kidney injury he did not recommend further urologic interventions with stenting.   Assessment & Plan:   Active Problems:   Pyelonephritis   Sepsis ([place])   Peripheral neuropathy ([facility])   Hypotension   Depression   AKI (acute kidney injury) ([place])   Hyperkalemia  1.  Sepsis -Present on admission, evidenced by a white count of 20,700, lactic acid of 3.0, acute kidney injury, hypotension with blood pressure of 74/46, with source of infection likely coming from urinary tract/pyelonephritis. -Blood cultures show no growth after overnight incubation. Urine culture showed growth of multiple species. -White count trended down to 12,300 on a.m. lab work -Continue empiric IV antibiotic therapy with ceftriaxone 1 g IV every 24 hours  2.  UTI/Pyelonephritis -She has a history of recurrent urinary tract infections, with urologic history including having retroperitoneal fibrosis -Continue ceftriaxone 1 g IV every 24 hours, urine cultures growing multiple species, blood  cultures showing no growth after overnight incubation -A.m. labs showing a downward trend white count is 12,300, with lactic acid 1.4  3.  Left-sided hydronephrosis -Patient having a history of retroperitoneal fibrosis status post ureteral lysis with stenting back in 2014, procedure was performed by Dr. Alinda Money then. -I discussed case with Dr Tresa Moore who was on-call for urology who reviewed scans. He felt that left-sided hydronephrosis was stable when compared to prior scans. With AKI resolving, and patient having clinical improvement he did not recommend further urologic procedures. She has an upcoming appointment with Dr. Alinda Money next month.  4.   Acute kidney injury -Initially presented with creatinine of 1.36, creatinine trended down to 0.83 on following morning with IV fluid resuscitation  5.  History of hypertension -Initially presented with hypotension that responded to IV fluid resuscitation -Blood pressures now elevated, will restart lisinopril 10 mg by mouth daily   DVT prophylaxis: Heparin Code Status: Full code Family Communication:  Disposition Plan: Continue IV antibiotics, follow-up on cultures, anticipate discharge home when medically stable  Consultants:   Telephone consultation made to urology  Antimicrobials:   Ceftriaxone   Subjective: Looks better this morning awake and alert, denies fevers, chills, nausea, vomiting  Objective: Vitals:   02/08/16 1730 02/08/16 2119 02/09/16 0612 02/09/16 1420  BP: 121/56 (!) 123/51 (!) 106/46 (!) 157/61  Pulse: 66 74 (!) 57 80  Resp: 13 20 18 20   Temp:  100.2 F (37.9 C) 98 F (36.7 C) 98.1 F (36.7 C)  TempSrc:  Oral Oral Oral  SpO2: 97% 92% 94% 100%  Weight:      Height:        Intake/Output Summary (Last 24 hours) at 02/09/16  [place] filed at 02/09/16 1420  Gross per 24 hour  Intake          3483.67 ml  Output             3800 ml  Net          -316.33 ml   Filed Weights   02/08/16 1152  Weight: 88.5  kg (195 lb)    Examination:  General exam: Appears calm and comfortable, nontoxic appearing  Respiratory system: Clear to auscultation. Respiratory effort normal. Cardiovascular system: S1 & S2 heard, RRR. No JVD, murmurs, rubs, gallops or clicks. No pedal edema. Gastrointestinal system: Abdomen is nondistended, soft and nontender. No organomegaly or masses felt. Normal bowel sounds heard. Did not have costovertebral tenderness Central nervous system: Alert and oriented. No focal neurological deficits. Extremities: Symmetric 5 x 5 power. Skin: No rashes, lesions or ulcers Psychiatry: Judgement and insight appear normal. Mood & affect appropriate.     Data Reviewed: I have personally reviewed following labs and imaging studies  CBC:  Recent Labs Lab 02/08/16 1219 02/09/16 0439  WBC 20.7* 12.3*  HGB 14.8 11.8*  HCT 45.1 36.0  MCV 95.8 96.0  PLT 287 [id number]   Basic Metabolic Panel:  Recent Labs Lab 02/08/16 1219 02/09/16 0439  NA 136 138  K 5.5* 3.7  CL 102 108  CO2 24 26  GLUCOSE 111* 109*  BUN 14 13  CREATININE 1.31* 0.83  CALCIUM 9.0 8.3*   GFR: Estimated Creatinine Clearance: 61.5 mL/min (by C-G formula based on SCr of 0.83 mg/dL). Liver Function Tests:  Recent Labs Lab 02/08/16 1219 02/09/16 0439  AST 62* 40  ALT 41 39  ALKPHOS 62 58  BILITOT 1.6* 0.7  PROT 6.4* 5.1*  ALBUMIN 3.8 2.8*    Recent Labs Lab 02/08/16 1219  LIPASE 32   No results for input(s): AMMONIA in the last 168 hours. Coagulation Profile:  Recent Labs Lab 02/08/16 1850  INR 1.15   Cardiac Enzymes: No results for input(s): CKTOTAL, CKMB, CKMBINDEX, TROPONINI in the last 168 hours. BNP (last 3 results) No results for input(s): PROBNP in the last 8760 hours. HbA1C: No results for input(s): HGBA1C in the last 72 hours. CBG: No results for input(s): GLUCAP in the last 168 hours. Lipid Profile: No results for input(s): CHOL, HDL, LDLCALC, TRIG, CHOLHDL, LDLDIRECT in the last  72 hours. Thyroid Function Tests: No results for input(s): TSH, T4TOTAL, FREET4, T3FREE, THYROIDAB in the last 72 hours. Anemia Panel: No results for input(s): VITAMINB12, FOLATE, FERRITIN, TIBC, IRON, RETICCTPCT in the last 72 hours. Sepsis Labs:  Recent Labs Lab 02/08/16 1811 02/08/16 1850 02/08/16 2131  PROCALCITON  --  0.36  --   LATICACIDVEN 3.0* 1.7 1.4    Recent Results (from the past 240 hour(s))  Urine culture     Status: Abnormal   Collection Time: 02/08/16 12:11 PM  Result Value Ref Range Status   Specimen Description URINE, RANDOM  Final   Special Requests NONE  Final   Culture MULTIPLE SPECIES PRESENT, SUGGEST RECOLLECTION (A)  Final   Report Status 02/09/2016 FINAL  Final         Radiology Studies: Ct Abdomen Pelvis Wo Contrast  Result Date: 02/08/2016 CLINICAL DATA:  Left ureteral adhesion. History of small bowel obstruction, retroperitoneal fibrosis, jejunal mass, endometrial cancer, colon polyps, small bowel resection, ventral hernia, bladder suspension, and hysterectomy. EXAM: CT ABDOMEN AND PELVIS WITHOUT CONTRAST TECHNIQUE: Multidetector CT imaging of the abdomen and pelvis was performed  following the standard protocol without IV contrast. COMPARISON:  11/02/2012 FINDINGS: Lower chest: Atelectasis in the lung bases. Coronary artery calcifications. Hepatobiliary: Unenhanced appearance is unremarkable. Pancreas: Unenhanced appearance is unremarkable. Spleen: Unenhanced appearance is unremarkable. Adrenals/Urinary Tract: No adrenal gland nodules. Left renal atrophy with hydronephrosis in the left kidney. Left ureter is decompressed. No stones are identified. There is prominent stranding around the left kidney and in the left pericolic gutter. This suggests obstruction at the left ureteropelvic junction. Obstructing lesion is not identified, possibly indicating a stricture. No pyelocaliectasis or ureterectasis on the right. Ureters are displaced medially consistent  with clinical history of retroperitoneal fibrosis. Visualization of the bladder is limited due to streak artifact from hip arthroplasties Stomach/Bowel: Stomach, small bowel, and colon are mostly decompressed. Scattered stool in the colon. Small bowel anastomosis in the mid abdomen. Appendix is normal. Vascular/Lymphatic: Aortic atherosclerosis. No enlarged abdominal or pelvic lymph nodes. Reproductive: Surgical absence of the uterus. Unable to visualize adnexal structures due to streak artifact from hip prostheses. Other: Scarring in the anterior abdominal wall. No discrete hernia identified. No free air or free fluid in the abdomen. Musculoskeletal: Postoperative changes with posterior laminectomies and posterior/ anterior fixation in the lower lumbar spine. Diffuse degenerative changes. Bilateral hip arthroplasties. IMPRESSION: Left renal parenchymal atrophy with prominent left hydronephrosis and decompressed left ureter. Prominent stranding around the left kidney in ureter. Changes suggest obstruction at the ureteropelvic junction, likely due to effusions or stricture. Ureters are displaced medially consistent with history of retroperitoneal fibrosis. No stones are identified. Electronically Signed   By: Lucienne Capers M.D.   On: 02/08/2016 21:37   Dg Chest Port 1 View  Result Date: 02/08/2016 CLINICAL DATA:  LEFT upper quadrant abdominal pain radiating to LEFT side, pain with urination, sleepy, weakness, nausea, history hypertension, endometrial cancer EXAM: PORTABLE CHEST 1 VIEW COMPARISON:  Portable exam 1711 hours compared to 06/23/2014 FINDINGS: Normal heart size, mediastinal contours, and pulmonary vascularity. Chronic bronchitic changes without infiltrate, pleural effusion or pneumothorax. No acute osseous findings. IMPRESSION: Chronic bronchitic changes without infiltrate. Electronically Signed   By: Lavonia Dana M.D.   On: 02/08/2016 17:23        Scheduled Meds: . amitriptyline  25 mg Oral  Q breakfast  . cefTRIAXone (ROCEPHIN)  IV  1 g Intravenous Q24H  . gabapentin  300 mg Oral QHS  . heparin  5,000 Units Subcutaneous Q8H  . [START ON 02/10/2016] Influenza vac split quadrivalent PF  0.5 mL Intramuscular Tomorrow-1000  . sodium chloride flush  3 mL Intravenous Q12H   Continuous Infusions: . sodium chloride 100 mL/hr at 02/09/16 0602     LOS: 0 days    Time spent: 35 min    Kelvin Cellar, MD [facility] Pager [phone]  If 7PM-7AM, please contact night-coverage [email or web address] Password [email or web address] 02/09/2016, 2:25 PM

## 2016-02-10 DIAGNOSIS — N12 Tubulo-interstitial nephritis, not specified as acute or chronic: Secondary | ICD-10-CM

## 2016-02-10 DIAGNOSIS — A419 Sepsis, unspecified organism: Principal | ICD-10-CM

## 2016-02-10 LAB — BASIC METABOLIC PANEL
ANION GAP: 6 (ref 5–15)
BUN: 9 mg/dL (ref 6–20)
CHLORIDE: 110 mmol/L (ref 101–111)
CO2: 23 mmol/L (ref 22–32)
CREATININE: 0.72 mg/dL (ref 0.44–1.00)
Calcium: 8.6 mg/dL — ABNORMAL LOW (ref 8.9–10.3)
GFR calc non Af Amer: >60 mL/min (ref >60)
Glucose, Bld: 99 mg/dL (ref 65–99)
POTASSIUM: 4 mmol/L (ref 3.5–5.1)
SODIUM: 139 mmol/L (ref 135–145)

## 2016-02-10 LAB — CBC
HCT: 37.4 % (ref 36.0–46.0)
HEMOGLOBIN: 12 g/dL (ref 12.0–15.0)
MCH: 31.2 pg (ref 26.0–34.0)
MCHC: 32.1 g/dL (ref 30.0–36.0)
MCV: 97.1 fL (ref 78.0–100.0)
PLATELETS: 227 10*3/uL (ref 150–400)
RBC: 3.85 MIL/uL — AB (ref 3.87–5.11)
RDW: 12.8 % (ref 11.5–15.5)
WBC: 8.9 10*3/uL (ref 4.0–10.5)

## 2016-02-10 MED ORDER — SODIUM CHLORIDE 0.9 % IV SOLN: INTRAVENOUS | Status: AC

## 2016-02-10 MED ORDER — INFLUENZA VAC SPLIT QUAD 0.5 ML IM SUSY: 0.5000 mL | PREFILLED_SYRINGE | INTRAMUSCULAR | Status: DC | PRN

## 2016-02-10 MED ORDER — SENNOSIDES-DOCUSATE SODIUM 8.6-50 MG PO TABS
1.0000 | ORAL_TABLET | Freq: Two times a day (BID) | ORAL | Status: DC
  Administered 2016-02-10 (×2): 1 via ORAL
  Filled 2016-02-10 (×3): qty 1

## 2016-02-10 MED ORDER — POLYETHYLENE GLYCOL 3350 17 G PO PACK
17.0000 g | PACK | Freq: Every day | ORAL | Status: DC
  Administered 2016-02-10: 17 g via ORAL
  Filled 2016-02-10 (×2): qty 1

## 2016-02-10 NOTE — Care Management Note (Addendum)
Case Management Note  Patient Details  Name: [name] MRN: [id number] Date of Birth: [birth year]  Subjective/Objective:                 Patient admitted with pyelo, hydronephrosis, UDJ stent in past, may need another pending eval. From home with son. Urologist Dr Meriam Sprague.    Action/Plan:  DC to home with son. No CM needs at DC.   Expected Discharge Date:                  Expected Discharge Plan:  Home/Self Care  In-House Referral:  NA  Discharge planning Services  CM Consult  Post Acute Care Choice:  NA Choice offered to:  NA  DME Arranged:  N/A DME Agency:  NA  HH Arranged:  NA HH Agency:  NA  Status of Service:  In process, will continue to follow  If discussed at Long Length of Stay Meetings, dates discussed:    Additional Comments:  Carles Collet, RN 02/10/2016, 11:03 AM

## 2016-02-10 NOTE — Progress Notes (Signed)
PROGRESS NOTE    CLAUDENE BUECHLER  [id number] DOB: [birth year] DOA: 02/08/2016 PCP: [name], MD   Brief Narrative:  Mrs Wilderman is a pleasant 70 year old female with a past medical history of retroperitoneal fibrosis, h/o ureteral stents by Dr.Borden, admitted to the medicine service on 02/08/2016 when she presented with complaints of left-sided flank pain associated with dysuria and fevers. On presentation she was found to have white count of 20,700 with a lactic acid of 3.0. She was further worked up with a CT scan of abdomen and pelvis that revealed prominent left-sided hydronephrosis. Radiology also reported obstruction at the ureteropelvic junction likely due to stricture. She was started on empiric IV antibiotic therapy with ceftriaxone. Dr.Zamora discussed case with Dr. Tresa Moore of urology to repeat scans and felt that hydronephrosis was stable.   Assessment & Plan:   1.  Sepsis -Present on admission, evidenced by a white count of 20,700, lactic acid of 3.0, acute kidney injury, hypotension with blood pressure of 74/46, with source of infection likely coming from urinary tract/pyelonephritis. -Blood cultures -NGTD - Urine culture-polymicrobial -sepsis physiology resolved -Continue empiric IV ceftriaxone for 1-59more days then switch to Oral cephalosporins   2.  UTI/Pyelonephritis --see above  3.  Left-sided hydronephrosis - Patient has long history of retroperitoneal fibrosis and chronic hydronephrosis, history of ureteral stents followed by Dr. Dutch Gray, I called and discussed case with Dr. Alinda Money who will review images and decide if urological intervention is needed at this time.  4.   Acute kidney injury -resolved with hydration  5.  History of hypertension -Initially presented with hypotension that responded to IV fluid resuscitation -Blood pressures stable, back on lisinopril  DVT prophylaxis: Heparin Code Status: Full code Family Communication: None at  bedside Disposition Plan: Home in 24-48H if no further intervention needed  Consultants:   D/w Dr.Borden  Antimicrobials:   Ceftriaxone   Subjective: Looks better this morning awake and alert, denies fevers, chills, nausea, vomiting  Objective: Vitals:   02/09/16 0612 02/09/16 1420 02/09/16 2123 02/10/16 0449  BP: (!) 106/46 (!) 157/61 (!) 128/48 (!) 154/81  Pulse: (!) 57 80 (!) 58 63  Resp: 18 20 18 20   Temp: 98 F (36.7 C) 98.1 F (36.7 C) 98.3 F (36.8 C) 98.2 F (36.8 C)  TempSrc: Oral Oral Oral Oral  SpO2: 94% 100% 94% 96%  Weight:      Height:        Intake/Output Summary (Last 24 hours) at 02/10/16 1314 Last data filed at 02/10/16 1046  Gross per 24 hour  Intake          3773.33 ml  Output             4150 ml  Net          -376.67 ml   Filed Weights   02/08/16 1152  Weight: 88.5 kg (195 lb)    Examination:  General exam: Appears calm and comfortable, nontoxic appearing  Respiratory system: Clear to auscultation. Respiratory effort normal. Cardiovascular system: S1 & S2 heard, RRR. No JVD, murmurs, rubs, gallops or clicks. No pedal edema. Gastrointestinal system: Abdomen is nondistended, soft and nontender. No organomegaly or masses felt. Normal bowel sounds heard. Did not have costovertebral tenderness Central nervous system: Alert and oriented. No focal neurological deficits. Extremities: Symmetric 5 x 5 power. Skin: No rashes, lesions or ulcers Psychiatry: Judgement and insight appear normal. Mood & affect appropriate.     Data Reviewed: I have personally reviewed following labs  and imaging studies  CBC:  Recent Labs Lab 02/08/16 1219 02/09/16 0439 02/10/16 0455  WBC 20.7* 12.3* 8.9  HGB 14.8 11.8* 12.0  HCT 45.1 36.0 37.4  MCV 95.8 96.0 97.1  PLT 287 231 [id number]   Basic Metabolic Panel:  Recent Labs Lab 02/08/16 1219 02/09/16 0439 02/10/16 0455  NA 136 138 139  K 5.5* 3.7 4.0  CL 102 108 110  CO2 24 26 23   GLUCOSE 111* 109* 99   BUN 14 13 9   CREATININE 1.31* 0.83 0.72  CALCIUM 9.0 8.3* 8.6*   GFR: Estimated Creatinine Clearance: 63.8 mL/min (by C-G formula based on SCr of 0.72 mg/dL). Liver Function Tests:  Recent Labs Lab 02/08/16 1219 02/09/16 0439  AST 62* 40  ALT 41 39  ALKPHOS 62 58  BILITOT 1.6* 0.7  PROT 6.4* 5.1*  ALBUMIN 3.8 2.8*    Recent Labs Lab 02/08/16 1219  LIPASE 32   No results for input(s): AMMONIA in the last 168 hours. Coagulation Profile:  Recent Labs Lab 02/08/16 1850  INR 1.15   Cardiac Enzymes: No results for input(s): CKTOTAL, CKMB, CKMBINDEX, TROPONINI in the last 168 hours. BNP (last 3 results) No results for input(s): PROBNP in the last 8760 hours. HbA1C: No results for input(s): HGBA1C in the last 72 hours. CBG: No results for input(s): GLUCAP in the last 168 hours. Lipid Profile: No results for input(s): CHOL, HDL, LDLCALC, TRIG, CHOLHDL, LDLDIRECT in the last 72 hours. Thyroid Function Tests: No results for input(s): TSH, T4TOTAL, FREET4, T3FREE, THYROIDAB in the last 72 hours. Anemia Panel: No results for input(s): VITAMINB12, FOLATE, FERRITIN, TIBC, IRON, RETICCTPCT in the last 72 hours. Sepsis Labs:  Recent Labs Lab 02/08/16 1811 02/08/16 1850 02/08/16 2131  PROCALCITON  --  0.36  --   LATICACIDVEN 3.0* 1.7 1.4    Recent Results (from the past 240 hour(s))  Urine culture     Status: Abnormal   Collection Time: 02/08/16 12:11 PM  Result Value Ref Range Status   Specimen Description URINE, RANDOM  Final   Special Requests NONE  Final   Culture MULTIPLE SPECIES PRESENT, SUGGEST RECOLLECTION (A)  Final   Report Status 02/09/2016 FINAL  Final  Culture, blood (x 2)     Status: None (Preliminary result)   Collection Time: 02/08/16  5:37 PM  Result Value Ref Range Status   Specimen Description BLOOD LEFT HAND  Final   Special Requests IN PEDIATRIC BOTTLE 2CC  Final   Culture NO GROWTH < 24 HOURS  Final   Report Status PENDING  Incomplete   Culture, blood (x 2)     Status: None (Preliminary result)   Collection Time: 02/08/16  5:48 PM  Result Value Ref Range Status   Specimen Description BLOOD RIGHT HAND  Final   Special Requests IN PEDIATRIC BOTTLE 2CC  Final   Culture NO GROWTH < 24 HOURS  Final   Report Status PENDING  Incomplete         Radiology Studies: Ct Abdomen Pelvis Wo Contrast  Result Date: 02/08/2016 CLINICAL DATA:  Left ureteral adhesion. History of small bowel obstruction, retroperitoneal fibrosis, jejunal mass, endometrial cancer, colon polyps, small bowel resection, ventral hernia, bladder suspension, and hysterectomy. EXAM: CT ABDOMEN AND PELVIS WITHOUT CONTRAST TECHNIQUE: Multidetector CT imaging of the abdomen and pelvis was performed following the standard protocol without IV contrast. COMPARISON:  11/02/2012 FINDINGS: Lower chest: Atelectasis in the lung bases. Coronary artery calcifications. Hepatobiliary: Unenhanced appearance is unremarkable. Pancreas: Unenhanced appearance is  unremarkable. Spleen: Unenhanced appearance is unremarkable. Adrenals/Urinary Tract: No adrenal gland nodules. Left renal atrophy with hydronephrosis in the left kidney. Left ureter is decompressed. No stones are identified. There is prominent stranding around the left kidney and in the left pericolic gutter. This suggests obstruction at the left ureteropelvic junction. Obstructing lesion is not identified, possibly indicating a stricture. No pyelocaliectasis or ureterectasis on the right. Ureters are displaced medially consistent with clinical history of retroperitoneal fibrosis. Visualization of the bladder is limited due to streak artifact from hip arthroplasties Stomach/Bowel: Stomach, small bowel, and colon are mostly decompressed. Scattered stool in the colon. Small bowel anastomosis in the mid abdomen. Appendix is normal. Vascular/Lymphatic: Aortic atherosclerosis. No enlarged abdominal or pelvic lymph nodes. Reproductive:  Surgical absence of the uterus. Unable to visualize adnexal structures due to streak artifact from hip prostheses. Other: Scarring in the anterior abdominal wall. No discrete hernia identified. No free air or free fluid in the abdomen. Musculoskeletal: Postoperative changes with posterior laminectomies and posterior/ anterior fixation in the lower lumbar spine. Diffuse degenerative changes. Bilateral hip arthroplasties. IMPRESSION: Left renal parenchymal atrophy with prominent left hydronephrosis and decompressed left ureter. Prominent stranding around the left kidney in ureter. Changes suggest obstruction at the ureteropelvic junction, likely due to effusions or stricture. Ureters are displaced medially consistent with history of retroperitoneal fibrosis. No stones are identified. Electronically Signed   By: Lucienne Capers M.D.   On: 02/08/2016 21:37   Dg Chest Port 1 View  Result Date: 02/08/2016 CLINICAL DATA:  LEFT upper quadrant abdominal pain radiating to LEFT side, pain with urination, sleepy, weakness, nausea, history hypertension, endometrial cancer EXAM: PORTABLE CHEST 1 VIEW COMPARISON:  Portable exam 1711 hours compared to 06/23/2014 FINDINGS: Normal heart size, mediastinal contours, and pulmonary vascularity. Chronic bronchitic changes without infiltrate, pleural effusion or pneumothorax. No acute osseous findings. IMPRESSION: Chronic bronchitic changes without infiltrate. Electronically Signed   By: Lavonia Dana M.D.   On: 02/08/2016 17:23        Scheduled Meds: . amitriptyline  25 mg Oral Q breakfast  . cefTRIAXone (ROCEPHIN)  IV  1 g Intravenous Q24H  . gabapentin  300 mg Oral QHS  . heparin  5,000 Units Subcutaneous Q8H  . lisinopril  10 mg Oral Daily  . polyethylene glycol  17 g Oral Daily  . senna-docusate  1 tablet Oral BID  . sodium chloride flush  3 mL Intravenous Q12H   Continuous Infusions: . sodium chloride Stopped (02/10/16 1042)     LOS: 1 day    Time spent: 72  min    Javaeh Muscatello, MD [facility] Pager [phone]  If 7PM-7AM, please contact night-coverage [email or web address] Password [email or web address] 02/10/2016, 1:14 PM

## 2016-02-10 NOTE — Progress Notes (Signed)
Patient ID: ESPN WHRITENOUR, female   DOB: 10-30-1945, 70 y.o.   MRN: [id number]   I was called by Dr. Broadus John today regarding [name].  She was admitted on 02/08/16 with left pyelonephritis.  She had a CT without contrast performed indicating left hydronephrosis as previously seen and known.  She has a history of a very difficult repair for a ureteral obstruction felt to be related to retroperitoneal fibrosis related to her prior vascular surgery most likely.  Per her renogram last October, she has a known poorly functioning left kidney (23% relative renal function) with possible partial obstruction but no evidence of complete obstruction.   She is now clinically improved on antibiotic therapy.  Cultures indicate multiple species.    I would recommend continued empiric medical therapy with transition to 3rd generation cephalosporins orally when appropriate.  As long as she is clinically improved, I do not think she requires any urologic intervention as she does not have a complete obstruction of the left kidney based on her most recent renogram last year.  Furthermore, her hydronephrosis is chronic and not significantly changed from prior imaging.  Please call me if she does not continue to improve.  I would recommend and repeat UA following her complete antibiotic course to ensure resolution of her infection.

## 2016-02-11 MED ORDER — HYDROCODONE-ACETAMINOPHEN 5-325 MG PO TABS: 1.0000 | ORAL_TABLET | Freq: Four times a day (QID) | ORAL | 0 refills | Status: DC | PRN

## 2016-02-11 MED ORDER — CEFPODOXIME PROXETIL 200 MG PO TABS: 200.0000 mg | ORAL_TABLET | Freq: Two times a day (BID) | ORAL | 0 refills | Status: DC

## 2016-02-11 MED ORDER — CEFPODOXIME PROXETIL 200 MG PO TABS
200.0000 mg | ORAL_TABLET | Freq: Two times a day (BID) | ORAL | Status: DC
  Administered 2016-02-11: 200 mg via ORAL
  Filled 2016-02-11: qty 1

## 2016-02-11 NOTE — Discharge Summary (Signed)
Physician Discharge Summary  [name] [id number] DOB: 1946/04/28 DOA: 02/08/2016  PCP: [name], MD  Admit date: 02/08/2016 Discharge date: 02/11/2016  Time spent: 45 minutes  Recommendations for Outpatient Follow-up:  1. PCP in 1 week 2. Dr.Les Borden 10/20   Discharge Diagnoses:  Active Problems:   Pyelonephritis   Sepsis ([place])   Peripheral neuropathy ([facility])   Hypotension   Depression   AKI (acute kidney injury) ([place])   Hyperkalemia   Discharge Condition: stable  Diet recommendation: regular  Filed Weights   02/08/16 1152  Weight: 88.5 kg (195 lb)    History of present illness:  [name] is a pleasant 70 year old female with a past medical history of retroperitoneal fibrosis, h/o ureteral stents by Dr.Borden, admitted to the medicine service on 02/08/2016 when she presented with complaints of left-sided flank pain associated with dysuria and fevers. On presentation she was found to have white count of 20,700 with a lactic acid of 3.0. She was further worked up with a CT scan of abdomen and pelvis that revealed prominent left-sided hydronephrosis  Hospital Course:  1.  Sepsis -with white count of 20,700, lactic acid of 3.0, acute kidney injury, hypotension with blood pressure of 74/46, with source of infection likely coming from urinary tract/pyelonephritis. -improved, sepsis physiology resolved -Blood cultures -NGTD, Urine culture-polymicrobial -Improved on IV ceftriaxone, since urine culture was polymicrobial, it was changed to Oral cefpodoxime at discharge for 77more days to complete a 10day course -I discussed case with Dr.Borden who reviewed images and felt that her chronic hydronephrosis is stable and she has likely a partial obstruction and no evidence of complete obstruction. No urological intervention needed at this time since improved on Abx per Dr.Borden  and I have asked her to keep her Appt with Dr.Borden later this months  2.   UTI/Pyelonephritis --see above  3.  Left-sided hydronephrosis - Patient has long history of retroperitoneal fibrosis and chronic hydronephrosis, history of ureteral stents followed by Dr. Dutch Gray, I called and discussed case with Dr. Alinda Money who reviewed images and felt that urological intervention was not needed at this time.  4.   Acute kidney injury -resolved with hydration  5.  History of hypertension -Initially presented with hypotension that responded to IV fluid resuscitation -Blood pressures stable, back on lisinopril  Consultations:  D/w Jerry Caras  Discharge Exam: Vitals:   02/10/16 2220 02/11/16 0544  BP: (!) 141/52 (!) 136/58  Pulse: (!) 46 (!) 51  Resp: 18 18  Temp: 98.8 F (37.1 C) 98.8 F (37.1 C)    General: AAOx3 Cardiovascular:S1s2/RRR Respiratory: CTAB  Discharge Instructions   Discharge Instructions    Diet - low sodium heart healthy    Complete by:  As directed    Increase activity slowly    Complete by:  As directed      Discharge Medication List as of 02/11/2016 11:22 AM    START taking these medications   Details  cefpodoxime (VANTIN) 200 MG tablet Take 1 tablet (200 mg total) by mouth 2 (two) times daily. For 6 days, Starting Thu 02/11/2016, Print      CONTINUE these medications which have CHANGED   Details  HYDROcodone-acetaminophen (NORCO/VICODIN) 5-325 MG tablet Take 1 tablet by mouth every 6 (six) hours as needed for moderate pain., Starting Thu 02/11/2016, No Print      CONTINUE these medications which have NOT CHANGED   Details  amitriptyline (ELAVIL) 25 MG tablet TAKE ONE TABLET BY MOUTH ONCE DAILY  AT BEDTIME, Normal    docusate sodium (COLACE) 100 MG capsule Take 100 mg by mouth at bedtime. Reported on 04/29/2015, Until Discontinued, Historical Med    gabapentin (NEURONTIN) 300 MG capsule Take 1 capsule (300 mg total) by mouth at bedtime., Starting Thu 07/16/2015, Normal    lisinopril-hydrochlorothiazide  (PRINZIDE,ZESTORETIC) 10-12.5 MG tablet Take 1 tablet by mouth daily., Starting Thu 07/16/2015, Normal    metroNIDAZOLE (METROGEL) 1 % gel Apply topically daily., Starting 07/10/2013, Until Discontinued, Normal    nystatin cream (MYCOSTATIN) Apply 1 application topically 2 (two) times daily., Starting 02/27/2014, Until Discontinued, Normal      STOP taking these medications     meloxicam (MOBIC) 15 MG tablet      oxybutynin (DITROPAN) 5 MG tablet        Allergies  Allergen Reactions  . Cashew Nut Oil Hives  . Sulfamethoxazole     Mouth sores  . Zofran Hives and Nausea Only   Follow-up Information    BORDEN,LES, MD Follow up in 2 week(s).   Specialty:  Urology Why:  Keep appt with Dr.Borden Contact information: [address] [place] [address] [phone]            The results of significant diagnostics from this hospitalization (including imaging, microbiology, ancillary and laboratory) are listed below for reference.    Significant Diagnostic Studies: Ct Abdomen Pelvis Wo Contrast  Result Date: 02/08/2016 CLINICAL DATA:  Left ureteral adhesion. History of small bowel obstruction, retroperitoneal fibrosis, jejunal mass, endometrial cancer, colon polyps, small bowel resection, ventral hernia, bladder suspension, and hysterectomy. EXAM: CT ABDOMEN AND PELVIS WITHOUT CONTRAST TECHNIQUE: Multidetector CT imaging of the abdomen and pelvis was performed following the standard protocol without IV contrast. COMPARISON:  11/02/2012 FINDINGS: Lower chest: Atelectasis in the lung bases. Coronary artery calcifications. Hepatobiliary: Unenhanced appearance is unremarkable. Pancreas: Unenhanced appearance is unremarkable. Spleen: Unenhanced appearance is unremarkable. Adrenals/Urinary Tract: No adrenal gland nodules. Left renal atrophy with hydronephrosis in the left kidney. Left ureter is decompressed. No stones are identified. There is prominent stranding around the left kidney and in  the left pericolic gutter. This suggests obstruction at the left ureteropelvic junction. Obstructing lesion is not identified, possibly indicating a stricture. No pyelocaliectasis or ureterectasis on the right. Ureters are displaced medially consistent with clinical history of retroperitoneal fibrosis. Visualization of the bladder is limited due to streak artifact from hip arthroplasties Stomach/Bowel: Stomach, small bowel, and colon are mostly decompressed. Scattered stool in the colon. Small bowel anastomosis in the mid abdomen. Appendix is normal. Vascular/Lymphatic: Aortic atherosclerosis. No enlarged abdominal or pelvic lymph nodes. Reproductive: Surgical absence of the uterus. Unable to visualize adnexal structures due to streak artifact from hip prostheses. Other: Scarring in the anterior abdominal wall. No discrete hernia identified. No free air or free fluid in the abdomen. Musculoskeletal: Postoperative changes with posterior laminectomies and posterior/ anterior fixation in the lower lumbar spine. Diffuse degenerative changes. Bilateral hip arthroplasties. IMPRESSION: Left renal parenchymal atrophy with prominent left hydronephrosis and decompressed left ureter. Prominent stranding around the left kidney in ureter. Changes suggest obstruction at the ureteropelvic junction, likely due to effusions or stricture. Ureters are displaced medially consistent with history of retroperitoneal fibrosis. No stones are identified. Electronically Signed   By: Lucienne Capers M.D.   On: 02/08/2016 21:37   Dg Chest Port 1 View  Result Date: 02/08/2016 CLINICAL DATA:  LEFT upper quadrant abdominal pain radiating to LEFT side, pain with urination, sleepy, weakness, nausea, history hypertension, endometrial cancer EXAM:  PORTABLE CHEST 1 VIEW COMPARISON:  Portable exam 1711 hours compared to 06/23/2014 FINDINGS: Normal heart size, mediastinal contours, and pulmonary vascularity. Chronic bronchitic changes without  infiltrate, pleural effusion or pneumothorax. No acute osseous findings. IMPRESSION: Chronic bronchitic changes without infiltrate. Electronically Signed   By: Lavonia Dana M.D.   On: 02/08/2016 17:23    Microbiology: Recent Results (from the past 240 hour(s))  Urine culture     Status: Abnormal   Collection Time: 02/08/16 12:11 PM  Result Value Ref Range Status   Specimen Description URINE, RANDOM  Final   Special Requests NONE  Final   Culture MULTIPLE SPECIES PRESENT, SUGGEST RECOLLECTION (A)  Final   Report Status 02/09/2016 FINAL  Final  Culture, blood (x 2)     Status: None (Preliminary result)   Collection Time: 02/08/16  5:37 PM  Result Value Ref Range Status   Specimen Description BLOOD LEFT HAND  Final   Special Requests IN PEDIATRIC BOTTLE 2CC  Final   Culture NO GROWTH 3 DAYS  Final   Report Status PENDING  Incomplete  Culture, blood (x 2)     Status: None (Preliminary result)   Collection Time: 02/08/16  5:48 PM  Result Value Ref Range Status   Specimen Description BLOOD RIGHT HAND  Final   Special Requests IN PEDIATRIC BOTTLE 2CC  Final   Culture NO GROWTH 3 DAYS  Final   Report Status PENDING  Incomplete     Labs: Basic Metabolic Panel:  Recent Labs Lab 02/08/16 1219 02/09/16 0439 02/10/16 0455  NA 136 138 139  K 5.5* 3.7 4.0  CL 102 108 110  CO2 24 26 23   GLUCOSE 111* 109* 99  BUN 14 13 9   CREATININE 1.31* 0.83 0.72  CALCIUM 9.0 8.3* 8.6*   Liver Function Tests:  Recent Labs Lab 02/08/16 1219 02/09/16 0439  AST 62* 40  ALT 41 39  ALKPHOS 62 58  BILITOT 1.6* 0.7  PROT 6.4* 5.1*  ALBUMIN 3.8 2.8*    Recent Labs Lab 02/08/16 1219  LIPASE 32   No results for input(s): AMMONIA in the last 168 hours. CBC:  Recent Labs Lab 02/08/16 1219 02/09/16 0439 02/10/16 0455  WBC 20.7* 12.3* 8.9  HGB 14.8 11.8* 12.0  HCT 45.1 36.0 37.4  MCV 95.8 96.0 97.1  PLT 287 231 227   Cardiac Enzymes: No results for input(s): CKTOTAL, CKMB, CKMBINDEX,  TROPONINI in the last 168 hours. BNP: BNP (last 3 results) No results for input(s): BNP in the last 8760 hours.  ProBNP (last 3 results) No results for input(s): PROBNP in the last 8760 hours.  CBG: No results for input(s): GLUCAP in the last 168 hours.     SignedDomenic Polite MD.  [facility] 02/11/2016, 4:27 PM

## 2016-02-13 LAB — CULTURE, BLOOD (ROUTINE X 2)
Culture: NO GROWTH
Culture: NO GROWTH

## 2016-02-15 ENCOUNTER — Telehealth: Payer: Self-pay | Admitting: *Deleted

## 2016-02-15 NOTE — Telephone Encounter (Signed)
Attempted to contact patient about hospital discharge and transitional care management. Her phone was busy. Will try again later. She does have an appt scheduled with Dr Evette Doffing for 02/19/16 for a 6 month f/u that we can use for TCM.   Call Completed and Appointment Scheduled: Yes, Date: 02/19/16 with Dr Emiliano Dyer INFORMATION Date of Discharge:02/11/2016  Discharge Facility: [facility]  Principal Discharge Diagnosis: pyelonephritis  Patient and/or caregiver is knowledgeable of his/her condition(s) and treatment: Yes  MEDICATION RECONCILIATION Current medication list reviewed with patient:Yes Discharge Medications reviewed and reconciled with current medications.yes  Patient is able to obtain needed medications:Yes  ACTIVITIES OF DAILY LIVING  Is the patient able to perform his/her own ADLs: Yes.    Patient is receiving home health services: No.  PATIENT EDUCATION Questions/Concerns Discussed: No concerns at this time. Will f/u sooner if needed.

## 2016-02-17 DIAGNOSIS — N3020 Other chronic cystitis without hematuria: Secondary | ICD-10-CM | POA: Diagnosis not present

## 2016-02-19 ENCOUNTER — Ambulatory Visit (INDEPENDENT_AMBULATORY_CARE_PROVIDER_SITE_OTHER): Payer: Medicare HMO | Admitting: Pediatrics

## 2016-02-19 ENCOUNTER — Encounter: Payer: Self-pay | Admitting: Pediatrics

## 2016-02-19 VITALS — BP 137/70 | HR 77 | Temp 97.0°F | Ht 60.0 in | Wt 190.8 lb

## 2016-02-19 DIAGNOSIS — N390 Urinary tract infection, site not specified: Secondary | ICD-10-CM

## 2016-02-19 DIAGNOSIS — G629 Polyneuropathy, unspecified: Secondary | ICD-10-CM | POA: Diagnosis not present

## 2016-02-19 DIAGNOSIS — S32009K Unspecified fracture of unspecified lumbar vertebra, subsequent encounter for fracture with nonunion: Secondary | ICD-10-CM

## 2016-02-19 DIAGNOSIS — I10 Essential (primary) hypertension: Secondary | ICD-10-CM | POA: Diagnosis not present

## 2016-02-19 DIAGNOSIS — G8929 Other chronic pain: Secondary | ICD-10-CM | POA: Diagnosis not present

## 2016-02-19 DIAGNOSIS — Z23 Encounter for immunization: Secondary | ICD-10-CM | POA: Diagnosis not present

## 2016-02-19 NOTE — Progress Notes (Signed)
  Subjective:   Patient ID: [name], female    DOB: 1945/10/23, 70 y.o.   MRN: [id number] CC: med problem f/u, recent hospitalization  HPI: [name] is a 70 y.o. female presenting for recent hospitalization  Feeling much better since getting home 02/11/2016 was discharge date hospitalizied for UTI sepsis Seen by urology since discharge On 6 days of abx, finished a couple days ago No other medication changes  Feeling well now No CP, no breathing trouble  Ongoing back pain Had 4 surgeries on her back Having numbness and tingling L leg, not new Ongoing months Has noticed the tingling more recently Follows at the spine center, has an appt upcoming Worst in the morning when she first gets up in the morning Has to sit for a couple hours before pain medication works and she can move better Takes gabapentin, amitryptiline for pain as well  HTN: hasnt taken BP meds yet this morning  Had recent labs drawn at urology, pt told labs were fine  Relevant past medical, surgical, family and social history reviewed. Allergies and medications reviewed and updated. History  Smoking Status  . Never Smoker  Smokeless Tobacco  . Never Used   ROS: All systems negative other than what is in HPI  Objective:    BP 137/70   Pulse 77   Temp 97 F (36.1 C) (Oral)   Ht 5' (1.524 m)   Wt 190 lb 12.8 oz (86.5 kg)   BMI 37.26 kg/m   Wt Readings from Last 3 Encounters:  02/19/16 190 lb 12.8 oz (86.5 kg)  02/08/16 195 lb (88.5 kg)  11/18/15 199 lb 9.6 oz (90.5 kg)    Gen: NAD, alert, cooperative with exam, NCAT EYES: EOMI, no conjunctival injection, or no icterus ENT:  OP without erythema LYMPH: no cervical LAD CV: NRRR, normal S1/S2, no murmur, distal pulses 2+ b/l Resp: CTABL, no wheezes, normal WOB Abd: +BS, soft, NTND. no guarding or organomegaly Ext: No edema, warm Neuro: Alert and oriented MSK: normal muscle bulk  Assessment & Plan:  [name] was seen today for  f/u med problems, recent   Diagnoses and all orders for this visit:  Recurrent UTI Follows with urology Recently discharged from hospital for sepsis due to UTI Feeling well now, no further symptoms Off of antibiotics  Pseudoarthrosis L3- L5 Follows at the spine center Asking about getting an MRI for numbness/tingling symptoms rec she discuss with provider at the center  Encounter for immunization -     Flu vaccine HIGH DOSE PF  Essential hypertension Adequate control now Cont medication No symptoms of low BP  Peripheral polyneuropathy ([place]) Cont gabapentin, amitryptiline  Other chronic pain Follows at spine center for chronic back pain  Follow up plan: 3 mo [name], MD [place]

## 2016-02-20 DIAGNOSIS — N390 Urinary tract infection, site not specified: Secondary | ICD-10-CM | POA: Insufficient documentation

## 2016-02-24 DIAGNOSIS — N135 Crossing vessel and stricture of ureter without hydronephrosis: Secondary | ICD-10-CM | POA: Diagnosis not present

## 2016-02-24 DIAGNOSIS — N10 Acute tubulo-interstitial nephritis: Secondary | ICD-10-CM | POA: Diagnosis not present

## 2016-03-11 ENCOUNTER — Other Ambulatory Visit: Payer: Self-pay | Admitting: Pediatrics

## 2016-03-18 ENCOUNTER — Other Ambulatory Visit (HOSPITAL_COMMUNITY): Payer: Self-pay | Admitting: Physical Medicine and Rehabilitation

## 2016-03-18 DIAGNOSIS — M961 Postlaminectomy syndrome, not elsewhere classified: Secondary | ICD-10-CM

## 2016-03-24 ENCOUNTER — Ambulatory Visit (HOSPITAL_COMMUNITY): Payer: Medicare HMO

## 2016-04-05 ENCOUNTER — Ambulatory Visit (HOSPITAL_COMMUNITY): Payer: Medicare HMO

## 2016-04-12 DIAGNOSIS — R69 Illness, unspecified: Secondary | ICD-10-CM | POA: Diagnosis not present

## 2016-04-12 DIAGNOSIS — M961 Postlaminectomy syndrome, not elsewhere classified: Secondary | ICD-10-CM | POA: Diagnosis not present

## 2016-04-12 DIAGNOSIS — M461 Sacroiliitis, not elsewhere classified: Secondary | ICD-10-CM | POA: Diagnosis not present

## 2016-04-12 DIAGNOSIS — Z6836 Body mass index (BMI) 36.0-36.9, adult: Secondary | ICD-10-CM | POA: Diagnosis not present

## 2016-04-12 DIAGNOSIS — I10 Essential (primary) hypertension: Secondary | ICD-10-CM | POA: Diagnosis not present

## 2016-04-12 DIAGNOSIS — M545 Low back pain: Secondary | ICD-10-CM | POA: Diagnosis not present

## 2016-04-12 DIAGNOSIS — Z79899 Other long term (current) drug therapy: Secondary | ICD-10-CM | POA: Diagnosis not present

## 2016-04-12 DIAGNOSIS — M7071 Other bursitis of hip, right hip: Secondary | ICD-10-CM | POA: Diagnosis not present

## 2016-04-21 ENCOUNTER — Ambulatory Visit (INDEPENDENT_AMBULATORY_CARE_PROVIDER_SITE_OTHER): Payer: Medicare HMO

## 2016-04-21 ENCOUNTER — Ambulatory Visit (INDEPENDENT_AMBULATORY_CARE_PROVIDER_SITE_OTHER): Payer: Medicare HMO | Admitting: Family Medicine

## 2016-04-21 ENCOUNTER — Encounter: Payer: Self-pay | Admitting: Family Medicine

## 2016-04-21 VITALS — BP 112/70 | HR 80 | Temp 98.0°F | Ht 60.0 in | Wt 187.4 lb

## 2016-04-21 DIAGNOSIS — K5900 Constipation, unspecified: Secondary | ICD-10-CM

## 2016-04-21 DIAGNOSIS — L739 Follicular disorder, unspecified: Secondary | ICD-10-CM | POA: Diagnosis not present

## 2016-04-21 MED ORDER — MUPIROCIN 2 % EX OINT: 1.0000 "application " | TOPICAL_OINTMENT | Freq: Two times a day (BID) | CUTANEOUS | 0 refills | Status: DC

## 2016-04-21 MED ORDER — FLEET ENEMA 7-19 GM/118ML RE ENEM: 1.0000 | ENEMA | Freq: Every day | RECTAL | 1 refills | Status: DC | PRN

## 2016-04-21 MED ORDER — POLYETHYLENE GLYCOL 3350 17 GM/SCOOP PO POWD: 17.0000 g | Freq: Every day | ORAL | 1 refills | Status: DC

## 2016-04-21 NOTE — Progress Notes (Signed)
BP 112/70   Pulse 80   Temp 98 F (36.7 C) (Oral)   Ht 5' (1.524 m)   Wt 187 lb 6 oz (85 kg)   BMI 36.59 kg/m    Subjective:    Patient ID: [name], female    DOB: 1946/02/09, 70 y.o.   MRN: [id number]  HPI: [name] is a 70 y.o. female presenting on 04/21/2016 for Sore inside nose; Abdominal Pain with nausea and vomiting (history of bowel blockage, concerned this is happening again); and Constipation (x 1 week)   HPI Constipation Patient has been having abdominal pain and constipation and nausea but no vomiting been going on for the past week. She has left-sided abdominal pain.  She tried with sennacot and had one liquid stool. Her last normal BM was over 1 week. She does not know if she is passing gas. Pain has been 7 out of 10. She has a history of bowel obstruction with surgery. She denies any blood in her stool.  Nasal sore Patient has had a nose sore for the past few weeks and has been using some topical over-the-counter agents for it that usually work but they have not been improving it. The nose sores just inside her left nare.  Relevant past medical, surgical, family and social history reviewed and updated as indicated. Interim medical history since our last visit reviewed. Allergies and medications reviewed and updated.  Review of Systems  Constitutional: Negative for chills and fever.  Eyes: Negative for redness and visual disturbance.  Respiratory: Negative for chest tightness and shortness of breath.   Cardiovascular: Negative for chest pain and leg swelling.  Gastrointestinal: Positive for abdominal pain, constipation and nausea. Negative for blood in stool, diarrhea and vomiting.  Musculoskeletal: Negative for back pain and gait problem.  Skin: Negative for rash.  Neurological: Negative for light-headedness and headaches.  Psychiatric/Behavioral: Negative for agitation and behavioral problems.  All other systems reviewed and are  negative.   Per HPI unless specifically indicated above     Objective:    BP 112/70   Pulse 80   Temp 98 F (36.7 C) (Oral)   Ht 5' (1.524 m)   Wt 187 lb 6 oz (85 kg)   BMI 36.59 kg/m   Wt Readings from Last 3 Encounters:  04/21/16 187 lb 6 oz (85 kg)  02/19/16 190 lb 12.8 oz (86.5 kg)  02/08/16 195 lb (88.5 kg)    Physical Exam  Constitutional: She is oriented to person, place, and time. She appears well-developed and well-nourished. No distress.  Eyes: Conjunctivae are normal.  Cardiovascular: Normal rate, regular rhythm, normal heart sounds and intact distal pulses.   No murmur heard. Pulmonary/Chest: Effort normal and breath sounds normal. No respiratory distress. She has no wheezes. She has no rales.  Abdominal: Soft. Bowel sounds are normal. She exhibits no distension. There is tenderness (Mild left lower quadrant abdominal pain). There is no rebound.  Musculoskeletal: Normal range of motion. She exhibits no edema or tenderness.  Neurological: She is alert and oriented to person, place, and time. Coordination normal.  Skin: Skin is warm and dry. No rash noted. She is not diaphoretic.  Psychiatric: She has a normal mood and affect. Her behavior is normal.  Nursing note and vitals reviewed.  KUB: No abnormal bowel gas pattern noted, large amount of stool noted, await final read by radiologist    Assessment & Plan:   Problem List Items Addressed This Visit  None    Visit Diagnoses    Constipation, unspecified constipation type    -  Primary   Relevant Medications   polyethylene glycol powder (GLYCOLAX/MIRALAX) powder   sodium phosphate (FLEET) 7-19 GM/118ML ENEM   Other Relevant Orders   DG Abd 1 View   Folliculitis       Relevant Medications   mupirocin ointment (BACTROBAN) 2 %       Follow up plan: Return if symptoms worsen or fail to improve.  Counseling provided for all of the vaccine components Orders Placed This Encounter  Procedures  . DG Abd [place], MD [facility] 04/21/2016, 9:49 AM

## 2016-04-25 ENCOUNTER — Encounter (HOSPITAL_COMMUNITY): Payer: Self-pay | Admitting: *Deleted

## 2016-04-25 ENCOUNTER — Inpatient Hospital Stay (HOSPITAL_COMMUNITY)
Admission: EM | Admit: 2016-04-25 | Discharge: 2016-04-29 | DRG: 690 | Disposition: A | Discharge status: Home / Self Care | Payer: Medicare HMO | Attending: Internal Medicine | Admitting: Internal Medicine

## 2016-04-25 DIAGNOSIS — K5900 Constipation, unspecified: Secondary | ICD-10-CM | POA: Diagnosis present

## 2016-04-25 DIAGNOSIS — Z96653 Presence of artificial knee joint, bilateral: Secondary | ICD-10-CM | POA: Diagnosis present

## 2016-04-25 DIAGNOSIS — N135 Crossing vessel and stricture of ureter without hydronephrosis: Secondary | ICD-10-CM | POA: Diagnosis present

## 2016-04-25 DIAGNOSIS — Z90710 Acquired absence of both cervix and uterus: Secondary | ICD-10-CM

## 2016-04-25 DIAGNOSIS — M47816 Spondylosis without myelopathy or radiculopathy, lumbar region: Secondary | ICD-10-CM | POA: Diagnosis present

## 2016-04-25 DIAGNOSIS — R109 Unspecified abdominal pain: Secondary | ICD-10-CM | POA: Diagnosis present

## 2016-04-25 DIAGNOSIS — N10 Acute tubulo-interstitial nephritis: Secondary | ICD-10-CM | POA: Diagnosis not present

## 2016-04-25 DIAGNOSIS — E785 Hyperlipidemia, unspecified: Secondary | ICD-10-CM | POA: Diagnosis present

## 2016-04-25 DIAGNOSIS — N393 Stress incontinence (female) (male): Secondary | ICD-10-CM | POA: Diagnosis not present

## 2016-04-25 DIAGNOSIS — M545 Low back pain, unspecified: Secondary | ICD-10-CM

## 2016-04-25 DIAGNOSIS — Z981 Arthrodesis status: Secondary | ICD-10-CM

## 2016-04-25 DIAGNOSIS — Z87448 Personal history of other diseases of urinary system: Secondary | ICD-10-CM | POA: Diagnosis not present

## 2016-04-25 DIAGNOSIS — Z800 Family history of malignant neoplasm of digestive organs: Secondary | ICD-10-CM

## 2016-04-25 DIAGNOSIS — Z882 Allergy status to sulfonamides status: Secondary | ICD-10-CM

## 2016-04-25 DIAGNOSIS — E669 Obesity, unspecified: Secondary | ICD-10-CM | POA: Diagnosis present

## 2016-04-25 DIAGNOSIS — N3000 Acute cystitis without hematuria: Secondary | ICD-10-CM

## 2016-04-25 DIAGNOSIS — I10 Essential (primary) hypertension: Secondary | ICD-10-CM | POA: Diagnosis not present

## 2016-04-25 DIAGNOSIS — Z833 Family history of diabetes mellitus: Secondary | ICD-10-CM

## 2016-04-25 DIAGNOSIS — Z91018 Allergy to other foods: Secondary | ICD-10-CM

## 2016-04-25 DIAGNOSIS — Z6836 Body mass index (BMI) 36.0-36.9, adult: Secondary | ICD-10-CM

## 2016-04-25 DIAGNOSIS — K682 Retroperitoneal fibrosis: Secondary | ICD-10-CM | POA: Diagnosis present

## 2016-04-25 DIAGNOSIS — Z888 Allergy status to other drugs, medicaments and biological substances status: Secondary | ICD-10-CM

## 2016-04-25 DIAGNOSIS — Z96643 Presence of artificial hip joint, bilateral: Secondary | ICD-10-CM | POA: Diagnosis present

## 2016-04-25 DIAGNOSIS — N136 Pyonephrosis: Secondary | ICD-10-CM | POA: Diagnosis present

## 2016-04-25 DIAGNOSIS — R7302 Impaired glucose tolerance (oral): Secondary | ICD-10-CM | POA: Diagnosis present

## 2016-04-25 DIAGNOSIS — Z90722 Acquired absence of ovaries, bilateral: Secondary | ICD-10-CM

## 2016-04-25 DIAGNOSIS — Z87440 Personal history of urinary (tract) infections: Secondary | ICD-10-CM

## 2016-04-25 DIAGNOSIS — N1330 Unspecified hydronephrosis: Secondary | ICD-10-CM | POA: Diagnosis not present

## 2016-04-25 DIAGNOSIS — Z8542 Personal history of malignant neoplasm of other parts of uterus: Secondary | ICD-10-CM

## 2016-04-25 DIAGNOSIS — M47815 Spondylosis without myelopathy or radiculopathy, thoracolumbar region: Secondary | ICD-10-CM | POA: Diagnosis present

## 2016-04-25 DIAGNOSIS — G8929 Other chronic pain: Secondary | ICD-10-CM | POA: Diagnosis present

## 2016-04-25 DIAGNOSIS — N12 Tubulo-interstitial nephritis, not specified as acute or chronic: Secondary | ICD-10-CM | POA: Diagnosis not present

## 2016-04-25 DIAGNOSIS — B9620 Unspecified Escherichia coli [E. coli] as the cause of diseases classified elsewhere: Secondary | ICD-10-CM | POA: Diagnosis present

## 2016-04-25 DIAGNOSIS — N3090 Cystitis, unspecified without hematuria: Secondary | ICD-10-CM | POA: Diagnosis not present

## 2016-04-25 DIAGNOSIS — Z79891 Long term (current) use of opiate analgesic: Secondary | ICD-10-CM

## 2016-04-25 LAB — CBC
HCT: 43.4 % (ref 36.0–46.0)
Hemoglobin: 14.8 g/dL (ref 12.0–15.0)
MCH: 31.4 pg (ref 26.0–34.0)
MCHC: 34.1 g/dL (ref 30.0–36.0)
MCV: 92.1 fL (ref 78.0–100.0)
PLATELETS: 285 10*3/uL (ref 150–400)
RBC: 4.71 MIL/uL (ref 3.87–5.11)
RDW: 12.5 % (ref 11.5–15.5)
WBC: 16 10*3/uL — AB (ref 4.0–10.5)

## 2016-04-25 LAB — COMPREHENSIVE METABOLIC PANEL
ALT: 15 U/L (ref 14–54)
AST: 26 U/L (ref 15–41)
Albumin: 4.4 g/dL (ref 3.5–5.0)
Alkaline Phosphatase: 60 U/L (ref 38–126)
Anion gap: 10 (ref 5–15)
BILIRUBIN TOTAL: 0.9 mg/dL (ref 0.3–1.2)
BUN: 10 mg/dL (ref 6–20)
CHLORIDE: 100 mmol/L — AB (ref 101–111)
CO2: 26 mmol/L (ref 22–32)
CREATININE: 0.96 mg/dL (ref 0.44–1.00)
Calcium: 9.2 mg/dL (ref 8.9–10.3)
GFR calc Af Amer: >60 mL/min (ref >60)
GFR, EST NON AFRICAN AMERICAN: 58 mL/min — AB (ref >60)
Glucose, Bld: 142 mg/dL — ABNORMAL HIGH (ref 65–99)
Potassium: 4 mmol/L (ref 3.5–5.1)
Sodium: 136 mmol/L (ref 135–145)
TOTAL PROTEIN: 7.1 g/dL (ref 6.5–8.1)

## 2016-04-25 LAB — URINALYSIS, ROUTINE W REFLEX MICROSCOPIC
Bilirubin Urine: NEGATIVE
GLUCOSE, UA: NEGATIVE mg/dL
KETONES UR: NEGATIVE mg/dL
Nitrite: POSITIVE — AB
Specific Gravity, Urine: 1.015 (ref 1.005–1.030)
pH: 5 (ref 5.0–8.0)

## 2016-04-25 LAB — LIPASE, BLOOD: Lipase: 42 U/L (ref 11–51)

## 2016-04-25 MED ORDER — MORPHINE SULFATE (PF) 4 MG/ML IV SOLN: 4.0000 mg | INTRAVENOUS | Status: DC | PRN

## 2016-04-25 MED ORDER — CEFTRIAXONE SODIUM 1 G IJ SOLR
1.0000 g | Freq: Once | INTRAMUSCULAR | Status: AC
  Administered 2016-04-25: 1 g via INTRAVENOUS
  Filled 2016-04-25: qty 10

## 2016-04-25 MED ORDER — MORPHINE SULFATE (PF) 4 MG/ML IV SOLN
4.0000 mg | INTRAVENOUS | Status: DC | PRN
  Administered 2016-04-25: 4 mg via INTRAVENOUS
  Filled 2016-04-25: qty 1

## 2016-04-25 NOTE — ED Notes (Signed)
Writer was unsuccessful in drawing blood 2X

## 2016-04-25 NOTE — ED Triage Notes (Signed)
Pt reports LLQ pain with n/v x 1 week, worsening.  Pt reports hx of SBO.  Pt reports LBM was a week ago.  No bowel sounds noted

## 2016-04-25 NOTE — ED Notes (Signed)
Blood draw unsuccessful,  Blood clotted before it could be transferrred into collection tubes.

## 2016-04-25 NOTE — ED Provider Notes (Signed)
[facility] DEPT Provider Note   CSN: [id number] Arrival date & time: 04/25/16  1929     History   Chief Complaint Chief Complaint  Patient presents with  . Abdominal Pain    HPI [name] is a 70 y.o. female.  HPI Pt has been having abdominal pain since the first of October.  She states she was first told she had a uti.  She took those medications and started to feel a little better. She saw her doctor last Thursday.  She wa told to take medications for constipation.  She continues to have pain in her lower abdomen on the left side.  She has had some vomiting.  No vomiting. She has lost weight and feels constipated. She came to the ED because of the persistent pain. Past Medical History:  Diagnosis Date  . Arthritis   . Colon polyp, hyperplastic 04/27/2012   Colonoscopy 2010.  No adenomatous polyps   . Endometrioid adenocarcinoma   . History of endometrial cancer s/p TAH BSO June2013 06/15/2011   Grade 1 endometrioid adenocarcinoma with focal superficial myometrial invasion of 0.1 cm with a myometrium of 1.8 cm or approximately 7%. She had a 5-cm tumor. No lymphovascular space Involvement.    . Hyperlipidemia   . Hypertension   . Mass of in fold of jejunum s/p SB resection 07/19/2012 07/20/2012  . Obesity (BMI 30-39.9) 04/27/2012  . Pyelonephritis 02/2016  . 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 SBO (small bowel obstruction) - recurrent 04/27/2012  . Ureteral adhesion, left 2010   s/p ureterolysis & stenting Dr. Larwance Sachs now  . UTI (urinary tract infection)     Patient Active Problem List   Diagnosis Date Noted  . Recurrent UTI 02/20/2016  . Peripheral neuropathy ([place]) 02/08/2016  . Depression 02/08/2016  . OA (osteoarthritis) of hip 07/04/2014  . 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 Pseudoarthrosis L3- L5 12/17/2012  . Nocturnal leg cramps 11/13/2012    . HLD (hyperlipidemia) 08/10/2012  . Recurrent ventral incisional hernia s/p lap repair 04/23/2013 04/27/2012  . Obesity (BMI 30-39.9) 04/27/2012  . Chronic pain 04/27/2012  . Hypertension   . History of endometrial cancer s/p TAH BSO June2013 06/15/2011    Past Surgical History:  Procedure Laterality Date  . ABDOMINAL HYSTERECTOMY  11/02/10   TAH, BSO, lysis of adhesions for endometrial cancer  . [place], 2009   anterior L2-3, L3-4 arthrodesis  . BACK SURGERY  12/17/2012   Spinal fusion and repair   . BLADDER SUSPENSION  2003  . CATARACT EXTRACTION, BILATERAL  2003  . EYE SURGERY    . FOOT SURGERY  06/2010   left foot surgery  . HERNIA REPAIR  07/19/12   lap [place] repair  . INCISIONAL HERNIA REPAIR  11/02/10  . INSERTION OF MESH N/A 04/23/2013   Procedure: INSERTION OF MESH;  Surgeon: Adin Hector, MD;  Location: [facility];  Service: General;  Laterality: N/A;  . JOINT REPLACEMENT  09/2009   Right total knee  . KIDNEY SURGERY  2010   Left kidney  . LAPAROSCOPIC LYSIS OF ADHESIONS N/A 04/23/2013   Procedure: LAPAROSCOPIC LYSIS OF ADHESIONS;  Surgeon: Adin Hector, MD;  Location: [facility];  Service: General;  Laterality: N/A;  . TOTAL HIP ARTHROPLASTY  01/2010   Right total hip  . TOTAL HIP ARTHROPLASTY Left 07/04/2014   Procedure: LEFT TOTAL HIP ARTHROPLASTY ANTERIOR  APPROACH;  Surgeon: Gearlean Alf, MD;  Location: [facility];  Service: Orthopedics;  Laterality: Left;  . TOTAL KNEE ARTHROPLASTY     right  . URETEROLYSIS  2010   lap/open left ureterolysis & omental flap, ureter stent  . VENTRAL HERNIA REPAIR N/A 07/19/2012   Procedure: LAPAROSCOPIC LYSIS OF ADHESIONS, SMALL BOWEL RESECTION, SEROSAL REPAIR, PRIMARY VENTRAL HERNIA REPAIR;  Surgeon: Adin Hector, MD;  Location: [facility];  Service: General;  Laterality: N/A;  . VENTRAL HERNIA REPAIR N/A 04/23/2013   Procedure: LAPAROSCOPIC exploration and repair of hernia in abdominal VENTRAL wall  HERNIA;  Surgeon:  Adin Hector, MD;  Location: [facility];  Service: General;  Laterality: N/A;    OB History    No data available       Home Medications    Prior to Admission medications   Medication Sig Start Date End Date Taking? Authorizing Provider  amitriptyline (ELAVIL) 25 MG tablet TAKE ONE TABLET BY MOUTH ONCE DAILY AT BEDTIME 12/30/15  Yes [name], MD  gabapentin (NEURONTIN) 300 MG capsule Take 1 capsule (300 mg total) by mouth at bedtime. 07/16/15  Yes [name], MD  lisinopril-hydrochlorothiazide (PRINZIDE,ZESTORETIC) 10-12.5 MG tablet Take 1 tablet by mouth daily. 07/16/15  Yes [name], MD  oxyCODONE-acetaminophen (PERCOCET/ROXICET) 5-325 MG tablet Take by mouth every 6 (six) hours as needed for severe pain.   Yes Historical Provider, MD  polyethylene glycol powder (GLYCOLAX/MIRALAX) powder Take 17 g by mouth daily. 04/21/16  Yes Fransisca Kaufmann Dettinger, MD  sodium phosphate (FLEET) 7-19 GM/118ML ENEM Place 133 mLs (1 enema total) rectally daily as needed for severe constipation. 04/21/16  Yes Fransisca Kaufmann Dettinger, MD  meloxicam (MOBIC) 15 MG tablet TAKE 1 TABLET DAILY Patient not taking: Reported on 04/25/2016 03/11/16   [name], MD  mupirocin ointment (BACTROBAN) 2 % Place 1 application into the nose 2 (two) times daily. Patient not taking: Reported on 04/25/2016 04/21/16   Fransisca Kaufmann Dettinger, MD    Family History Family History  Problem Relation Age of Onset  . Diabetes Mother   . Cancer Mother     lung  . Diabetes Father   . Cancer Father     liver  . Colon cancer Sister   . Diabetes Sister   . Cancer Sister     colon    Social History Social History  Substance Use Topics  . Smoking status: Never Smoker  . Smokeless tobacco: Never Used  . Alcohol use No     Allergies   Cashew nut oil; Sulfamethoxazole; and Zofran   Review of Systems Review of Systems  All other systems reviewed and are negative.    Physical Exam Updated Vital Signs BP 150/73  (BP Location: Right Arm)   Pulse 92   Temp 100 F (37.8 C) (Oral)   Resp 18   Ht 5' (1.524 m)   Wt 88.9 kg   SpO2 100%   BMI 38.28 kg/m   Physical Exam  Constitutional: She appears well-developed and well-nourished. No distress.  HENT:  Head: Normocephalic and atraumatic.  Right Ear: External ear normal.  Left Ear: External ear normal.  Eyes: Conjunctivae are normal. Right eye exhibits no discharge. Left eye exhibits no discharge. No scleral icterus.  Neck: Neck supple. No tracheal deviation present.  Cardiovascular: Normal rate, regular rhythm and intact distal pulses.   Pulmonary/Chest: Effort normal and breath sounds normal. No stridor. No respiratory distress. She has no wheezes. She has  no rales.  Abdominal: Soft. Bowel sounds are normal. She exhibits no distension. There is tenderness in the left lower quadrant. There is no rebound and no guarding. No hernia.  Musculoskeletal: She exhibits no edema or tenderness.  Neurological: She is alert. She has normal strength. No cranial nerve deficit (no facial droop, extraocular movements intact, no slurred speech) or sensory deficit. She exhibits normal muscle tone. She displays no seizure activity. Coordination normal.  Skin: Skin is warm and dry. No rash noted.  Psychiatric: She has a normal mood and affect.  Nursing note and vitals reviewed.    ED Treatments / Results  Labs (all labs ordered are listed, but only abnormal results are displayed) Labs Reviewed  COMPREHENSIVE METABOLIC PANEL - Abnormal; Notable for the following:       Result Value   Chloride 100 (*)    Glucose, Bld 142 (*)    GFR calc non Af Amer 58 (*)    All other components within normal limits  CBC - Abnormal; Notable for the following:    WBC 16.0 (*)    All other components within normal limits  URINALYSIS, ROUTINE W REFLEX MICROSCOPIC - Abnormal; Notable for the following:    Color, Urine AMBER (*)    APPearance TURBID (*)    Hgb urine dipstick  MODERATE (*)    Protein, ur >=300 (*)    Nitrite POSITIVE (*)    Leukocytes, UA LARGE (*)    Bacteria, UA MANY (*)    Squamous Epithelial / LPF 0-5 (*)    All other components within normal limits  URINE CULTURE  LIPASE, BLOOD    Procedures Procedures (including critical care time)  Medications Ordered in ED Medications  morphine 4 MG/ML injection 4 mg (not administered)  cefTRIAXone (ROCEPHIN) 1 g in dextrose 5 % 50 mL IVPB (1 g Intravenous New Bag/Given 04/25/16 2252)     Initial Impression / Assessment and Plan / ED Course  I have reviewed the triage vital signs and the nursing notes.  Pertinent labs & imaging results that were available during my care of the patient were reviewed by me and considered in my medical decision making (see chart for details).  Clinical Course as of Apr 25 2325  Mon Apr 25, 2016  2316 Previous CT scans reviewed.  Pt had ureteral stricture and hydronephrosis on her CT in october  [JK]    Clinical Course User Index [JK] Dorie Rank, MD    Patient presents to emergency room with a recurrent urinary tract infection. Patient was admitted to the hospital October with sepsis associated with urinary tract infection. She had a CT scan at that time that showed hydronephrosis and a probable ureteral stricture. Patient was evaluated by urology. She did not have any surgical intervention. Patient's symptoms improved with IV antibiotics. Patient's symptoms have returned. She is having persistent pain in the left lower quadrant.  She does have a leukocytosis and considering her sepsis last month in the urinary obstruction I will consult the medical service for admission, IV antibiotics  Final Clinical Impressions(s) / ED Diagnoses   Final diagnoses:  Acute cystitis without hematuria  History of hydronephrosis    New Prescriptions New Prescriptions   No medications on file     Dorie Rank, MD 04/25/16 2326

## 2016-04-26 ENCOUNTER — Observation Stay (HOSPITAL_COMMUNITY): Payer: Medicare HMO

## 2016-04-26 ENCOUNTER — Encounter (HOSPITAL_COMMUNITY): Payer: Self-pay | Admitting: Internal Medicine

## 2016-04-26 DIAGNOSIS — E669 Obesity, unspecified: Secondary | ICD-10-CM | POA: Diagnosis not present

## 2016-04-26 DIAGNOSIS — N135 Crossing vessel and stricture of ureter without hydronephrosis: Secondary | ICD-10-CM | POA: Diagnosis not present

## 2016-04-26 DIAGNOSIS — N10 Acute tubulo-interstitial nephritis: Secondary | ICD-10-CM | POA: Diagnosis not present

## 2016-04-26 DIAGNOSIS — N136 Pyonephrosis: Secondary | ICD-10-CM | POA: Diagnosis present

## 2016-04-26 DIAGNOSIS — M47815 Spondylosis without myelopathy or radiculopathy, thoracolumbar region: Secondary | ICD-10-CM | POA: Diagnosis present

## 2016-04-26 DIAGNOSIS — Z90722 Acquired absence of ovaries, bilateral: Secondary | ICD-10-CM | POA: Diagnosis not present

## 2016-04-26 DIAGNOSIS — Z96653 Presence of artificial knee joint, bilateral: Secondary | ICD-10-CM | POA: Diagnosis present

## 2016-04-26 DIAGNOSIS — R109 Unspecified abdominal pain: Secondary | ICD-10-CM | POA: Diagnosis not present

## 2016-04-26 DIAGNOSIS — Z8542 Personal history of malignant neoplasm of other parts of uterus: Secondary | ICD-10-CM | POA: Diagnosis not present

## 2016-04-26 DIAGNOSIS — B9620 Unspecified Escherichia coli [E. coli] as the cause of diseases classified elsewhere: Secondary | ICD-10-CM | POA: Diagnosis not present

## 2016-04-26 DIAGNOSIS — N3000 Acute cystitis without hematuria: Secondary | ICD-10-CM | POA: Diagnosis not present

## 2016-04-26 DIAGNOSIS — Z87440 Personal history of urinary (tract) infections: Secondary | ICD-10-CM | POA: Diagnosis not present

## 2016-04-26 DIAGNOSIS — Z91018 Allergy to other foods: Secondary | ICD-10-CM | POA: Diagnosis not present

## 2016-04-26 DIAGNOSIS — Z833 Family history of diabetes mellitus: Secondary | ICD-10-CM | POA: Diagnosis not present

## 2016-04-26 DIAGNOSIS — N1330 Unspecified hydronephrosis: Secondary | ICD-10-CM | POA: Diagnosis not present

## 2016-04-26 DIAGNOSIS — Z96643 Presence of artificial hip joint, bilateral: Secondary | ICD-10-CM | POA: Diagnosis present

## 2016-04-26 DIAGNOSIS — Z90710 Acquired absence of both cervix and uterus: Secondary | ICD-10-CM | POA: Diagnosis not present

## 2016-04-26 DIAGNOSIS — Z888 Allergy status to other drugs, medicaments and biological substances status: Secondary | ICD-10-CM | POA: Diagnosis not present

## 2016-04-26 DIAGNOSIS — Z800 Family history of malignant neoplasm of digestive organs: Secondary | ICD-10-CM | POA: Diagnosis not present

## 2016-04-26 DIAGNOSIS — Z981 Arthrodesis status: Secondary | ICD-10-CM | POA: Diagnosis not present

## 2016-04-26 DIAGNOSIS — M545 Low back pain: Secondary | ICD-10-CM | POA: Diagnosis not present

## 2016-04-26 DIAGNOSIS — N393 Stress incontinence (female) (male): Secondary | ICD-10-CM | POA: Diagnosis not present

## 2016-04-26 DIAGNOSIS — Z87448 Personal history of other diseases of urinary system: Secondary | ICD-10-CM | POA: Diagnosis not present

## 2016-04-26 DIAGNOSIS — E785 Hyperlipidemia, unspecified: Secondary | ICD-10-CM | POA: Diagnosis not present

## 2016-04-26 DIAGNOSIS — N12 Tubulo-interstitial nephritis, not specified as acute or chronic: Secondary | ICD-10-CM | POA: Diagnosis not present

## 2016-04-26 DIAGNOSIS — I10 Essential (primary) hypertension: Secondary | ICD-10-CM | POA: Diagnosis not present

## 2016-04-26 DIAGNOSIS — Z79891 Long term (current) use of opiate analgesic: Secondary | ICD-10-CM | POA: Diagnosis not present

## 2016-04-26 DIAGNOSIS — Z882 Allergy status to sulfonamides status: Secondary | ICD-10-CM | POA: Diagnosis not present

## 2016-04-26 DIAGNOSIS — M47816 Spondylosis without myelopathy or radiculopathy, lumbar region: Secondary | ICD-10-CM | POA: Diagnosis present

## 2016-04-26 DIAGNOSIS — G8929 Other chronic pain: Secondary | ICD-10-CM | POA: Diagnosis present

## 2016-04-26 DIAGNOSIS — K5900 Constipation, unspecified: Secondary | ICD-10-CM | POA: Diagnosis present

## 2016-04-26 LAB — CBC WITH DIFFERENTIAL/PLATELET
BASOS PCT: 0 %
Basophils Absolute: 0 10*3/uL (ref 0.0–0.1)
EOS PCT: 0 %
Eosinophils Absolute: 0 10*3/uL (ref 0.0–0.7)
HCT: 40.5 % (ref 36.0–46.0)
Hemoglobin: 14.1 g/dL (ref 12.0–15.0)
LYMPHS ABS: 1.3 10*3/uL (ref 0.7–4.0)
Lymphocytes Relative: 6 %
MCH: 32.2 pg (ref 26.0–34.0)
MCHC: 34.8 g/dL (ref 30.0–36.0)
MCV: 92.5 fL (ref 78.0–100.0)
Monocytes Absolute: 1.2 10*3/uL — ABNORMAL HIGH (ref 0.1–1.0)
Monocytes Relative: 5 %
NEUTROS PCT: 89 %
Neutro Abs: 20.6 10*3/uL — ABNORMAL HIGH (ref 1.7–7.7)
PLATELETS: 290 10*3/uL (ref 150–400)
RBC: 4.38 MIL/uL (ref 3.87–5.11)
RDW: 12.6 % (ref 11.5–15.5)
WBC: 23.1 10*3/uL — AB (ref 4.0–10.5)

## 2016-04-26 LAB — COMPREHENSIVE METABOLIC PANEL
ALBUMIN: 3.4 g/dL — AB (ref 3.5–5.0)
ALT: 14 U/L (ref 14–54)
ANION GAP: 9 (ref 5–15)
AST: 25 U/L (ref 15–41)
Alkaline Phosphatase: 53 U/L (ref 38–126)
BUN: 11 mg/dL (ref 6–20)
CALCIUM: 8.6 mg/dL — AB (ref 8.9–10.3)
CO2: 24 mmol/L (ref 22–32)
CREATININE: 1.12 mg/dL — AB (ref 0.44–1.00)
Chloride: 104 mmol/L (ref 101–111)
GFR calc Af Amer: 56 mL/min — ABNORMAL LOW (ref >60)
GFR calc non Af Amer: 48 mL/min — ABNORMAL LOW (ref >60)
Glucose, Bld: 168 mg/dL — ABNORMAL HIGH (ref 65–99)
POTASSIUM: 3.7 mmol/L (ref 3.5–5.1)
SODIUM: 137 mmol/L (ref 135–145)
Total Bilirubin: 1 mg/dL (ref 0.3–1.2)
Total Protein: 6.4 g/dL — ABNORMAL LOW (ref 6.5–8.1)

## 2016-04-26 MED ORDER — PROMETHAZINE HCL 25 MG/ML IJ SOLN
12.5000 mg | Freq: Once | INTRAMUSCULAR | Status: AC
  Administered 2016-04-26: 12.5 mg via INTRAVENOUS
  Filled 2016-04-26: qty 1

## 2016-04-26 MED ORDER — SODIUM CHLORIDE 0.9 % IV SOLN
INTRAVENOUS | Status: AC
  Administered 2016-04-26: 03:00:00 via INTRAVENOUS

## 2016-04-26 MED ORDER — GABAPENTIN 300 MG PO CAPS
300.0000 mg | ORAL_CAPSULE | Freq: Every day | ORAL | Status: DC
  Administered 2016-04-26 – 2016-04-28 (×3): 300 mg via ORAL
  Filled 2016-04-26 (×3): qty 1

## 2016-04-26 MED ORDER — LISINOPRIL 10 MG PO TABS
10.0000 mg | ORAL_TABLET | Freq: Every day | ORAL | Status: DC
  Administered 2016-04-26 – 2016-04-27 (×2): 10 mg via ORAL
  Filled 2016-04-26 (×2): qty 1

## 2016-04-26 MED ORDER — MORPHINE SULFATE (PF) 2 MG/ML IV SOLN
1.0000 mg | INTRAVENOUS | Status: DC | PRN
  Administered 2016-04-26 – 2016-04-29 (×11): 1 mg via INTRAVENOUS
  Filled 2016-04-26 (×11): qty 1

## 2016-04-26 MED ORDER — POLYETHYLENE GLYCOL 3350 17 G PO PACK
17.0000 g | PACK | Freq: Every day | ORAL | Status: DC
  Administered 2016-04-26 – 2016-04-29 (×4): 17 g via ORAL
  Filled 2016-04-26 (×4): qty 1

## 2016-04-26 MED ORDER — ACETAMINOPHEN 650 MG RE SUPP: 650.0000 mg | Freq: Four times a day (QID) | RECTAL | Status: DC | PRN

## 2016-04-26 MED ORDER — AMITRIPTYLINE HCL 25 MG PO TABS
25.0000 mg | ORAL_TABLET | Freq: Every evening | ORAL | Status: DC | PRN
  Administered 2016-04-26 – 2016-04-28 (×3): 25 mg via ORAL
  Filled 2016-04-26 (×3): qty 1

## 2016-04-26 MED ORDER — DEXTROSE 5 % IV SOLN
1.0000 g | INTRAVENOUS | Status: DC
  Administered 2016-04-26 – 2016-04-29 (×4): 1 g via INTRAVENOUS
  Filled 2016-04-26 (×4): qty 10

## 2016-04-26 MED ORDER — ACETAMINOPHEN 325 MG PO TABS
650.0000 mg | ORAL_TABLET | Freq: Four times a day (QID) | ORAL | Status: DC | PRN
  Administered 2016-04-26: 650 mg via ORAL
  Filled 2016-04-26: qty 2

## 2016-04-26 MED ORDER — AMITRIPTYLINE HCL 25 MG PO TABS
25.0000 mg | ORAL_TABLET | Freq: Every day | ORAL | Status: DC
  Filled 2016-04-26: qty 1

## 2016-04-26 NOTE — Progress Notes (Signed)
Patient seen and examined. Admitted after midnight secondary to worsening left/flank and suprapubic pain. Found to have low grade fever and elevated WBC's. Patient endorses some nausea and vomiting at home as well. Abd x-ray with stool burden and UA suggesting UTI. Patient started on soapsuds enema (have had one BM already), PRN antiemetics and empiric antibiotics. Given hx of hydronephrosis and stricture and mild increase in her Cr, renal US requested and urology consult requested. Please refer to H&P written by Dr. Hal Hope for further info/details on admission.  Barton Dubois [id number]

## 2016-04-26 NOTE — Consult Note (Signed)
Reason for Consult: Chronic LEFT Hydronephrosis / Retroperitoneal FIbrosis, Pyelonephritis / Recurrent UTI, Stress Urinary Incontinence  Referring Physician: Barton Dubois MD  [name] is an 70 y.o. female.   HPI:   1 - Chronic LEFT Hydronephrosis / Retroperitoneal Fibrosis - chronic left hydro stemming from complicaitons from anterior lumbar fusion 2009. Previously managed with balloon dilation, then dedicated ureterolysis 2010, but likely still some component of partial obstruction. Renogram 2016 with left 23% relative function and T1/2 with partial obstruction at 22 min. She has h/o numerous other abdominal surgeries includin hernia repair with mesh 2014.      2- Pyelonephritis / Recurrent UTI - WBC 23K on admit and pyuria, bacteruria, flank pain c/w likely pyelonephritis. UCX 12/18 pending. Prior UCX's proteus or non-clonal. She does have some known left partial renal obstruction as per above.   Presently on Rocephin awaiting further CX data.   3 - Stress Urinary Incontinence - s/p sling 2005.   Today "[name]" is seen in consultation for above. She normally follows with Dr. Alinda Money in our group.   Past Medical History:  Diagnosis Date  . Arthritis   . Colon polyp, hyperplastic 04/27/2012   Colonoscopy 2010.  No adenomatous polyps   . Endometrioid adenocarcinoma   . History of endometrial cancer s/p TAH BSO June2013 06/15/2011   Grade 1 endometrioid adenocarcinoma with focal superficial myometrial invasion of 0.1 cm with a myometrium of 1.8 cm or approximately 7%. She had a 5-cm tumor. No lymphovascular space Involvement.    . Hyperlipidemia   . Hypertension   . Mass of in fold of jejunum s/p SB resection 07/19/2012 07/20/2012  . Obesity (BMI 30-39.9) 04/27/2012  . Pyelonephritis 02/2016  . 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 SBO (small bowel obstruction) - recurrent 04/27/2012  . Ureteral adhesion, left 2010   s/p  ureterolysis & stenting Dr. Larwance Sachs now  . UTI (urinary tract infection)     Past Surgical History:  Procedure Laterality Date  . ABDOMINAL HYSTERECTOMY  11/02/10   TAH, BSO, lysis of adhesions for endometrial cancer  . [place], 2009   anterior L2-3, L3-4 arthrodesis  . BACK SURGERY  12/17/2012   Spinal fusion and repair   . BLADDER SUSPENSION  2003  . CATARACT EXTRACTION, BILATERAL  2003  . EYE SURGERY    . FOOT SURGERY  06/2010   left foot surgery  . HERNIA REPAIR  07/19/12   lap [place] repair  . INCISIONAL HERNIA REPAIR  11/02/10  . INSERTION OF MESH N/A 04/23/2013   Procedure: INSERTION OF MESH;  Surgeon: Adin Hector, MD;  Location: [facility];  Service: General;  Laterality: N/A;  . JOINT REPLACEMENT  09/2009   Right total knee  . KIDNEY SURGERY  2010   Left kidney  . LAPAROSCOPIC LYSIS OF ADHESIONS N/A 04/23/2013   Procedure: LAPAROSCOPIC LYSIS OF ADHESIONS;  Surgeon: Adin Hector, MD;  Location: [facility];  Service: General;  Laterality: N/A;  . TOTAL HIP ARTHROPLASTY  01/2010   Right total hip  . TOTAL HIP ARTHROPLASTY Left 07/04/2014   Procedure: LEFT TOTAL HIP ARTHROPLASTY ANTERIOR APPROACH;  Surgeon: Gearlean Alf, MD;  Location: [facility];  Service: Orthopedics;  Laterality: Left;  . TOTAL KNEE ARTHROPLASTY     right  . URETEROLYSIS  2010   lap/open left ureterolysis & omental flap, ureter stent  . VENTRAL HERNIA REPAIR N/A 07/19/2012   Procedure: LAPAROSCOPIC LYSIS  OF ADHESIONS, SMALL BOWEL RESECTION, SEROSAL REPAIR, PRIMARY VENTRAL HERNIA REPAIR;  Surgeon: Adin Hector, MD;  Location[facility];  Service: General;  Laterality: N/A;  . VENTRAL HERNIA REPAIR N/A 04/23/2013   Procedure: LAPAROSCOPIC exploration and repair of hernia in abdominal VENTRAL wall  HERNIA;  Surgeon: Adin Hector, MD;  Location[facility];  Service: General;  Laterality: N/A;    Family History  Problem Relation Age of Onset  . Diabetes Mother   . Cancer Mother      lung  . Diabetes Father   . Cancer Father     liver  . Colon cancer Sister   . Diabetes Sister   . Cancer Sister     colon    Social History:  reports that she has never smoked. She has never used smokeless tobacco. She reports that she does not drink alcohol or use drugs.  Allergies:  Allergies  Allergen Reactions  . Cashew Nut Oil Hives  . Sulfamethoxazole     Mouth sores  . Zofran Hives and Nausea Only    Medications: I have reviewed the patient's current medications.  Results for orders placed or performed during the hospital encounter of 04/25/16 (from the past 48 hour(s))  Lipase, blood     Status: None   Collection Time: 04/25/16  8:21 PM  Result Value Ref Range   Lipase 42 11 - 51 U/L  Comprehensive metabolic panel     Status: Abnormal   Collection Time: 04/25/16  8:21 PM  Result Value Ref Range   Sodium 136 135 - 145 mmol/L   Potassium 4.0 3.5 - 5.1 mmol/L   Chloride 100 (L) 101 - 111 mmol/L   CO2 26 22 - 32 mmol/L   Glucose, Bld 142 (H) 65 - 99 mg/dL   BUN 10 6 - 20 mg/dL   Creatinine, Ser 0.96 0.44 - 1.00 mg/dL   Calcium 9.2 8.9 - 10.3 mg/dL   Total Protein 7.1 6.5 - 8.1 g/dL   Albumin 4.4 3.5 - 5.0 g/dL   AST 26 15 - 41 U/L   ALT 15 14 - 54 U/L   Alkaline Phosphatase 60 38 - 126 U/L   Total Bilirubin 0.9 0.3 - 1.2 mg/dL   GFR calc non Af Amer 58 (L) >60 mL/min   GFR calc Af Amer >60 >60 mL/min    Comment: (NOTE) The eGFR has been calculated using the CKD EPI equation. This calculation has not been validated in all clinical situations. eGFR's persistently <60 mL/min signify possible Chronic Kidney Disease.    Anion gap 10 5 - 15  CBC     Status: Abnormal   Collection Time: 04/25/16  8:21 PM  Result Value Ref Range   WBC 16.0 (H) 4.0 - 10.5 K/uL   RBC 4.71 3.87 - 5.11 MIL/uL   Hemoglobin 14.8 12.0 - 15.0 g/dL   HCT 43.4 36.0 - 46.0 %   MCV 92.1 78.0 - 100.0 fL   MCH 31.4 26.0 - 34.0 pg   MCHC 34.1 30.0 - 36.0 g/dL   RDW 12.5 11.5 - 15.5 %    Platelets 285 150 - 400 K/uL  Urinalysis, Routine w reflex microscopic     Status: Abnormal   Collection Time: 04/25/16  9:46 PM  Result Value Ref Range   Color, Urine AMBER (A) YELLOW    Comment: BIOCHEMICALS MAY BE AFFECTED BY COLOR   APPearance TURBID (A) CLEAR   Specific Gravity, Urine 1.015 1.005 - 1.030  pH 5.0 5.0 - 8.0   Glucose, UA NEGATIVE NEGATIVE mg/dL   Hgb urine dipstick MODERATE (A) NEGATIVE   Bilirubin Urine NEGATIVE NEGATIVE   Ketones, ur NEGATIVE NEGATIVE mg/dL   Protein, ur >=300 (A) NEGATIVE mg/dL   Nitrite POSITIVE (A) NEGATIVE   Leukocytes, UA LARGE (A) NEGATIVE   RBC / HPF TOO NUMEROUS TO COUNT 0 - 5 RBC/hpf   WBC, UA TOO NUMEROUS TO COUNT 0 - 5 WBC/hpf   Bacteria, UA MANY (A) NONE SEEN   Squamous Epithelial / LPF 0-5 (A) NONE SEEN   WBC Clumps PRESENT   Comprehensive metabolic panel     Status: Abnormal   Collection Time: 04/26/16  4:34 AM  Result Value Ref Range   Sodium 137 135 - 145 mmol/L   Potassium 3.7 3.5 - 5.1 mmol/L   Chloride 104 101 - 111 mmol/L   CO2 24 22 - 32 mmol/L   Glucose, Bld 168 (H) 65 - 99 mg/dL   BUN 11 6 - 20 mg/dL   Creatinine, Ser 1.12 (H) 0.44 - 1.00 mg/dL   Calcium 8.6 (L) 8.9 - 10.3 mg/dL   Total Protein 6.4 (L) 6.5 - 8.1 g/dL   Albumin 3.4 (L) 3.5 - 5.0 g/dL   AST 25 15 - 41 U/L   ALT 14 14 - 54 U/L   Alkaline Phosphatase 53 38 - 126 U/L   Total Bilirubin 1.0 0.3 - 1.2 mg/dL   GFR calc non Af Amer 48 (L) >60 mL/min   GFR calc Af Amer 56 (L) >60 mL/min    Comment: (NOTE) The eGFR has been calculated using the CKD EPI equation. This calculation has not been validated in all clinical situations. eGFR's persistently <60 mL/min signify possible Chronic Kidney Disease.    Anion gap 9 5 - 15  CBC WITH DIFFERENTIAL     Status: Abnormal   Collection Time: 04/26/16  4:34 AM  Result Value Ref Range   WBC 23.1 (H) 4.0 - 10.5 K/uL   RBC 4.38 3.87 - 5.11 MIL/uL   Hemoglobin 14.1 12.0 - 15.0 g/dL   HCT 40.5 36.0 - 46.0 %    MCV 92.5 78.0 - 100.0 fL   MCH 32.2 26.0 - 34.0 pg   MCHC 34.8 30.0 - 36.0 g/dL   RDW 12.6 11.5 - 15.5 %   Platelets 290 150 - 400 K/uL   Neutrophils Relative % 89 %   Neutro Abs 20.6 (H) 1.7 - 7.7 K/uL   Lymphocytes Relative 6 %   Lymphs Abs 1.3 0.7 - 4.0 K/uL   Monocytes Relative 5 %   Monocytes Absolute 1.2 (H) 0.1 - 1.0 K/uL   Eosinophils Relative 0 %   Eosinophils Absolute 0.0 0.0 - 0.7 K/uL   Basophils Relative 0 %   Basophils Absolute 0.0 0.0 - 0.1 K/uL    No results found.  Review of Systems  Constitutional: Positive for malaise/fatigue.  HENT: Negative.   Eyes: Negative.   Respiratory: Negative.   Cardiovascular: Negative.   Gastrointestinal: Positive for abdominal pain.  Genitourinary: Positive for dysuria and flank pain. Negative for hematuria.  Skin: Negative.   Neurological: Negative.   Endo/Heme/Allergies: Negative.   Psychiatric/Behavioral: Negative.    Blood pressure 127/61, pulse 97, temperature 98 F (36.7 C), temperature source Oral, resp. rate 18, height 5' (1.524 m), weight 85 kg (187 lb 6.3 oz), SpO2 98 %. Physical Exam  Constitutional: She is oriented to person, place, and time. She appears well-developed.  HENT:  Head: Normocephalic.  Cardiovascular: Normal rate.   Respiratory: Effort normal.  GI:  Significant truncal obesity. Prior Left subcostal and midline scars w/o hernias.   Genitourinary:  Genitourinary Comments: Very mild left CVAT  Musculoskeletal: Normal range of motion.  Neurological: She is alert and oriented to person, place, and time.  Skin: Skin is warm.  Psychiatric: She has a normal mood and affect. Her behavior is normal. Thought content normal.    Assessment/Plan:  1 - Chronic LEFT Hydronephrosis / Retroperitoneal Fibrosis - Very difficult case. I do not feel she is candidate for further efforts at reconstruction.  She has considered trial of chronic stenting to see if this would help decrease UTI frequency and is now more  inclined.    2- Pyelonephritis / Recurrent UTI - agree with current ABX pending further CX data. No further data from our office EMR to guide therapy.   3 - Stress Urinary Incontinence -  Resolved s/p sling.  I will make Dr. Alinda Money aware of pt's admission.   ,  04/26/2016, 3:27 PM

## 2016-04-26 NOTE — Progress Notes (Signed)
Pharmacy Antibiotic Note  [name] is a 70 y.o. female admitted on 04/25/2016 with pyelonephritis.  Pharmacy has been consulted for rocephin dosing.  Plan: Rocephin 1gm IV q24h  Height: 5' (152.4 cm) Weight: 196 lb (88.9 kg) IBW/kg (Calculated) : 45.5  Temp (24hrs), Avg:99.6 F (37.6 C), Min:98.7 F (37.1 C), Max:100.5 F (38.1 C)   Recent Labs Lab 04/25/16 2021  WBC 16.0*  CREATININE 0.96    Estimated Creatinine Clearance: 53.4 mL/min (by C-G formula based on SCr of 0.96 mg/dL).    Allergies  Allergen Reactions  . Cashew Nut Oil Hives  . Sulfamethoxazole     Mouth sores  . Zofran Hives and Nausea Only    Antimicrobials this admission: 12/19 rocephin >>    >>   Dose adjustments this admission:   Microbiology results:  BCx:   UCx:    Sputum:    MRSA PCR:   Thank you for allowing pharmacy to be a part of this patient's care.   Dorrene German 04/26/2016 2:27 AM

## 2016-04-26 NOTE — Progress Notes (Signed)
Report given to third floor nurse at 1110. [place]

## 2016-04-26 NOTE — H&P (Signed)
History and Physical    AICHA FRENTZEL [id number] DOB: 1945/09/13 DOA: 04/25/2016  PCP: [name], MD  Patient coming from: Home.  Chief Complaint: Left flank pain.  HPI: [name] is a 70 y.o. female with history of hypertension, retroperitoneal fibrosis with chronic hydronephrosis of the left kidney with a recurrent UTI presents the ER because of left flank pain which has been ongoing for last few days with worsening yesterday and patient started developing nausea and vomiting. The patient was mildly febrile with UA consistent with UTI. X-ray abdomen shows moderate stool burden. Patient states she has not moved her bowels for almost a week. Since patient has nausea and vomiting and unable to keep in patient has been started on IV antibiotics and admitted for further management.   ED Course: IV antibiotics and fluids are started.  Review of Systems: As per HPI, rest all negative.   Past Medical History:  Diagnosis Date  . Arthritis   . Colon polyp, hyperplastic 04/27/2012   Colonoscopy 2010.  No adenomatous polyps   . Endometrioid adenocarcinoma   . History of endometrial cancer s/p TAH BSO June2013 06/15/2011   Grade 1 endometrioid adenocarcinoma with focal superficial myometrial invasion of 0.1 cm with a myometrium of 1.8 cm or approximately 7%. She had a 5-cm tumor. No lymphovascular space Involvement.    . Hyperlipidemia   . Hypertension   . Mass of in fold of jejunum s/p SB resection 07/19/2012 07/20/2012  . Obesity (BMI 30-39.9) 04/27/2012  . Pyelonephritis 02/2016  . 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 SBO (small bowel obstruction) - recurrent 04/27/2012  . Ureteral adhesion, left 2010   s/p ureterolysis & stenting Dr. Larwance Sachs now  . UTI (urinary tract infection)     Past Surgical History:  Procedure Laterality Date  . ABDOMINAL HYSTERECTOMY  11/02/10   TAH, BSO, lysis of adhesions for  endometrial cancer  . [place], 2009   anterior L2-3, L3-4 arthrodesis  . BACK SURGERY  12/17/2012   Spinal fusion and repair   . BLADDER SUSPENSION  2003  . CATARACT EXTRACTION, BILATERAL  2003  . EYE SURGERY    . FOOT SURGERY  06/2010   left foot surgery  . HERNIA REPAIR  07/19/12   lap Moran repair  . INCISIONAL HERNIA REPAIR  11/02/10  . INSERTION OF MESH N/A 04/23/2013   Procedure: INSERTION OF MESH;  Surgeon: Adin Hector, MD;  Location: [facility];  Service: General;  Laterality: N/A;  . JOINT REPLACEMENT  09/2009   Right total knee  . KIDNEY SURGERY  2010   Left kidney  . LAPAROSCOPIC LYSIS OF ADHESIONS N/A 04/23/2013   Procedure: LAPAROSCOPIC LYSIS OF ADHESIONS;  Surgeon: Adin Hector, MD;  Location: [facility];  Service: General;  Laterality: N/A;  . TOTAL HIP ARTHROPLASTY  01/2010   Right total hip  . TOTAL HIP ARTHROPLASTY Left 07/04/2014   Procedure: LEFT TOTAL HIP ARTHROPLASTY ANTERIOR APPROACH;  Surgeon: Gearlean Alf, MD;  Location: [facility];  Service: Orthopedics;  Laterality: Left;  . TOTAL KNEE ARTHROPLASTY     right  . URETEROLYSIS  2010   lap/open left ureterolysis & omental flap, ureter stent  . VENTRAL HERNIA REPAIR N/A 07/19/2012   Procedure: LAPAROSCOPIC LYSIS OF ADHESIONS, SMALL BOWEL RESECTION, SEROSAL REPAIR, PRIMARY VENTRAL HERNIA REPAIR;  Surgeon: Adin Hector, MD;  Location: [facility];  Service: General;  Laterality: N/A;  .  VENTRAL HERNIA REPAIR N/A 04/23/2013   Procedure: LAPAROSCOPIC exploration and repair of hernia in abdominal VENTRAL wall  HERNIA;  Surgeon: Adin Hector, MD;  Location: [facility];  Service: General;  Laterality: N/A;     reports that she has never smoked. She has never used smokeless tobacco. She reports that she does not drink alcohol or use drugs.  Allergies  Allergen Reactions  . Cashew Nut Oil Hives  . Sulfamethoxazole     Mouth sores  . Zofran Hives and Nausea Only    Family History  Problem Relation Age of  Onset  . Diabetes Mother   . Cancer Mother     lung  . Diabetes Father   . Cancer Father     liver  . Colon cancer Sister   . Diabetes Sister   . Cancer Sister     colon    Prior to Admission medications   Medication Sig Start Date End Date Taking? Authorizing Provider  amitriptyline (ELAVIL) 25 MG tablet TAKE ONE TABLET BY MOUTH ONCE DAILY AT BEDTIME 12/30/15  Yes [name], MD  gabapentin (NEURONTIN) 300 MG capsule Take 1 capsule (300 mg total) by mouth at bedtime. 07/16/15  Yes [name], MD  lisinopril-hydrochlorothiazide (PRINZIDE,ZESTORETIC) 10-12.5 MG tablet Take 1 tablet by mouth daily. 07/16/15  Yes [name], MD  oxyCODONE-acetaminophen (PERCOCET/ROXICET) 5-325 MG tablet Take by mouth every 6 (six) hours as needed for severe pain.   Yes Historical Provider, MD  polyethylene glycol powder (GLYCOLAX/MIRALAX) powder Take 17 g by mouth daily. 04/21/16  Yes Fransisca Kaufmann Dettinger, MD  sodium phosphate (FLEET) 7-19 GM/118ML ENEM Place 133 mLs (1 enema total) rectally daily as needed for severe constipation. 04/21/16  Yes Fransisca Kaufmann Dettinger, MD  meloxicam (MOBIC) 15 MG tablet TAKE 1 TABLET DAILY Patient not taking: Reported on 04/25/2016 03/11/16   [name], MD  mupirocin ointment (BACTROBAN) 2 % Place 1 application into the nose 2 (two) times daily. Patient not taking: Reported on 04/25/2016 04/21/16   Fransisca Kaufmann Dettinger, MD    Physical Exam: Vitals:   04/26/16 0000 04/26/16 0038 04/26/16 0100 04/26/16 0129  BP: 138/75 125/71 124/75 132/73  Pulse: 110 109 112 111  Resp:  20  18  Temp:    100.5 F (38.1 C)  TempSrc:    Oral  SpO2: 90% 90% (!) 89% 90%  Weight:      Height:          Constitutional: Moderately built and nourished. Vitals:   04/26/16 0000 04/26/16 0038 04/26/16 0100 04/26/16 0129  BP: 138/75 125/71 124/75 132/73  Pulse: 110 109 112 111  Resp:  20  18  Temp:    100.5 F (38.1 C)  TempSrc:    Oral  SpO2: 90% 90% (!) 89% 90%  Weight:       Height:       Eyes: Anicteric no pallor. ENMT: No discharge from the ears eyes nose or mouth. Neck: No mass felt. No neck rigidity. Respiratory: No rhonchi or crepitations. Cardiovascular: S1 and S2 heard. No murmurs appreciated. Abdomen: Soft nontender bowel sounds present. No guarding or rigidity. Musculoskeletal: No edema. No joint effusion. Skin: No rash. Skin appears warm. Neurologic: Alert awake oriented to time place and person. Moves all extremities. Psychiatric: Appears normal. Normal affect.   Labs on Admission: I have personally reviewed following labs and imaging studies  CBC:  Recent Labs Lab 04/25/16 2021  WBC 16.0*  HGB 14.8  HCT 43.4  MCV 92.1  PLT [id number]   Basic Metabolic Panel:  Recent Labs Lab 04/25/16 2021  NA 136  K 4.0  CL 100*  CO2 26  GLUCOSE 142*  BUN 10  CREATININE 0.96  CALCIUM 9.2   GFR: Estimated Creatinine Clearance: 53.4 mL/min (by C-G formula based on SCr of 0.96 mg/dL). Liver Function Tests:  Recent Labs Lab 04/25/16 2021  AST 26  ALT 15  ALKPHOS 60  BILITOT 0.9  PROT 7.1  ALBUMIN 4.4    Recent Labs Lab 04/25/16 2021  LIPASE 42   No results for input(s): AMMONIA in the last 168 hours. Coagulation Profile: No results for input(s): INR, PROTIME in the last 168 hours. Cardiac Enzymes: No results for input(s): CKTOTAL, CKMB, CKMBINDEX, TROPONINI in the last 168 hours. BNP (last 3 results) No results for input(s): PROBNP in the last 8760 hours. HbA1C: No results for input(s): HGBA1C in the last 72 hours. CBG: No results for input(s): GLUCAP in the last 168 hours. Lipid Profile: No results for input(s): CHOL, HDL, LDLCALC, TRIG, CHOLHDL, LDLDIRECT in the last 72 hours. Thyroid Function Tests: No results for input(s): TSH, T4TOTAL, FREET4, T3FREE, THYROIDAB in the last 72 hours. Anemia Panel: No results for input(s): VITAMINB12, FOLATE, FERRITIN, TIBC, IRON, RETICCTPCT in the last 72 hours. Urine analysis:      Component Value Date/Time   COLORURINE AMBER (A) 04/25/2016 2146   APPEARANCEUR TURBID (A) 04/25/2016 2146   APPEARANCEUR Clear 10/01/2015 1233   LABSPEC 1.015 04/25/2016 2146   PHURINE 5.0 04/25/2016 2146   GLUCOSEU NEGATIVE 04/25/2016 2146   HGBUR MODERATE (A) 04/25/2016 2146   BILIRUBINUR NEGATIVE 04/25/2016 2146   BILIRUBINUR Negative 10/01/2015 [place] 04/25/2016 2146   PROTEINUR >=300 (A) 04/25/2016 2146   UROBILINOGEN negative 09/30/2014 1049   UROBILINOGEN 0.2 07/07/2014 0537   NITRITE POSITIVE (A) 04/25/2016 2146   LEUKOCYTESUR LARGE (A) 04/25/2016 2146   LEUKOCYTESUR Negative 10/01/2015 1233   Sepsis Labs: @LABRCNTIP (procalcitonin:4,lacticidven:4) )No results found for this or any previous visit (from the past 240 hour(s)).   Radiological Exams on Admission: No results found.    Assessment/Plan Principal Problem:   Intractable abdominal pain Active Problems:   Hypertension   Retroperitoneal fibrosis in setting of chronic abscess at ureter 2010   Acute cystitis without hematuria   Abdominal pain    1. Pyelonephritis with history of hydronephrosis with retroperitoneal fibrosis - patient has been placed on ceftriaxone which will be dosed for pyelonephritis per pharmacy. Follow urine cultures. Patient has had similar symptoms in October with hydronephrosis and at that time urologist and has not recommended any intervention. I'm ordering a renal sonogram to see if any persistent hydronephrosis is present. If present may need urology recommendations. Continue with gentle hydration. 2. Nausea vomiting - probably secondary to pyelonephritis. Patient also has moderate stool burden and constipation which may be contributing. I have ordered soapsuds edema. 3. Hypertension - since patient is receiving IV fluids I'm holding all diuretics. Continue lisinopril.   DVT prophylaxis: SCDs. Code Status: Full code.  Family Communication: Discussed with patient.   Disposition Plan: Home.  Consults called: None.  Admission status: Observation.    Rise Patience MD [facility] Pager [phone].  If 7PM-7AM, please contact night-coverage [email or web address] Password [email or web address]  04/26/2016, 2:15 AM

## 2016-04-27 DIAGNOSIS — N10 Acute tubulo-interstitial nephritis: Principal | ICD-10-CM

## 2016-04-27 DIAGNOSIS — R109 Unspecified abdominal pain: Secondary | ICD-10-CM

## 2016-04-27 DIAGNOSIS — N135 Crossing vessel and stricture of ureter without hydronephrosis: Secondary | ICD-10-CM

## 2016-04-27 LAB — BASIC METABOLIC PANEL
ANION GAP: 7 (ref 5–15)
BUN: 14 mg/dL (ref 6–20)
CO2: 25 mmol/L (ref 22–32)
Calcium: 8 mg/dL — ABNORMAL LOW (ref 8.9–10.3)
Chloride: 105 mmol/L (ref 101–111)
Creatinine, Ser: 1.07 mg/dL — ABNORMAL HIGH (ref 0.44–1.00)
GFR calc Af Amer: 59 mL/min — ABNORMAL LOW (ref >60)
GFR calc non Af Amer: 51 mL/min — ABNORMAL LOW (ref >60)
GLUCOSE: 100 mg/dL — AB (ref 65–99)
POTASSIUM: 3.9 mmol/L (ref 3.5–5.1)
Sodium: 137 mmol/L (ref 135–145)

## 2016-04-27 LAB — CBC
HEMATOCRIT: 36.3 % (ref 36.0–46.0)
Hemoglobin: 12.3 g/dL (ref 12.0–15.0)
MCH: 31.7 pg (ref 26.0–34.0)
MCHC: 33.9 g/dL (ref 30.0–36.0)
MCV: 93.6 fL (ref 78.0–100.0)
Platelets: 253 10*3/uL (ref 150–400)
RBC: 3.88 MIL/uL (ref 3.87–5.11)
RDW: 13.1 % (ref 11.5–15.5)
WBC: 21.5 10*3/uL — AB (ref 4.0–10.5)

## 2016-04-27 MED ORDER — POTASSIUM CHLORIDE IN NACL 20-0.9 MEQ/L-% IV SOLN
INTRAVENOUS | Status: AC
  Administered 2016-04-27: 21:00:00 via INTRAVENOUS
  Filled 2016-04-27 (×2): qty 1000

## 2016-04-27 MED ORDER — POTASSIUM CHLORIDE 2 MEQ/ML IV SOLN: INTRAVENOUS | Status: DC

## 2016-04-27 NOTE — Progress Notes (Signed)
PROGRESS NOTE  [name] [id number] DOB: 09-10-1945 DOA: 04/25/2016 PCP: [name], MD  Brief History:  70 year old female with a history of hypertension, retroperitoneal fibrosis with chronic left hydronephrosis, recurrent UTI, hyperlipidemia, endometrioid adenocarcinoma status post hysterectomy presents to the emergency department with 2-3 day history of nausea, vomiting, and left flank pain. The patient has subjective fevers and chills. She also complained of dysuria and foul-smelling urine. Urinalysis at the time of admission revealed TNTC WBC. The patient was started on empiric ceftriaxone. Renal ultrasound showed persistent left hydronephrosis. Urology was consulted.  Assessment/Plan: Pyelonephritis -Continue ceftriaxone pending culture data -Preliminary culture shows Escherichia coli -Nausea and vomiting have improving -Continue IV fluids for 12 additional hours  Chronic LEFT Hydronephrosis / Retroperitoneal Fibrosis -  -chronic left hydro stemming from complicaitons from anterior lumbar fusion 2009. Previously managed with balloon dilation, then dedicated ureterolysis 2010, but likely still some component of partial obstruction. Renogram 2016 with left 23% relative function and T1/2 with partial obstruction at 22 min. She has h/o numerous other abdominal surgeries includin hernia repair with mesh 2014.  Hypertension -Discontinue lisinopril/HCTZ due to soft blood pressure  Impaired glucose tolerance -07/16/2015, A1c 6.3  Stress Urinary Incontinence - s/p sling 2005.     Disposition Plan:   Home in 1-2 days  Family Communication:   No Family at bedside  Consultants:  Urology--Manny  Code Status:  FULL   DVT Prophylaxis:  [place] Heparin / [place] Lovenox   Procedures: As Listed in Progress Note Above  Antibiotics: None    Subjective: She still complains of left flank pain which is a little bit better. She states that her nausea and vomiting are  better. She wants to eat. Denies any fevers, chills, headache, neck pain, chest pain, shortness breath, abdominal pain, diarrhea. Dysuria is improving.  Objective: Vitals:   04/26/16 2029 04/27/16 0422 04/27/16 0530 04/27/16 1531  BP: (!) 100/47 (!) 98/48  108/65  Pulse: 73 87  71  Resp: 14 14  14   Temp: 98.8 F (37.1 C) 98.2 F (36.8 C)  98 F (36.7 C)  TempSrc: Oral Oral  Oral  SpO2: 92% 98%  97%  Weight:   86.6 kg (191 lb)   Height:        Intake/Output Summary (Last 24 hours) at 04/27/16 1858 Last data filed at 04/27/16 1730  Gross per 24 hour  Intake             1850 ml  Output              200 ml  Net             1650 ml   Weight change: -3.905 kg (-8 lb 9.7 oz) Exam:   General:  Pt is alert, follows commands appropriately, not in acute distress  HEENT: No icterus, No thrush, No neck mass, [place]/AT  Cardiovascular: RRR, S1/S2, no rubs, no gallops  Respiratory: CTA bilaterally, no wheezing, no crackles, no rhonchi  Abdomen: Soft/+BS, Left flank tender, non distended, no guarding  Extremities: No edema, No lymphangitis, No petechiae, No rashes, no synovitis   Data Reviewed: I have personally reviewed following labs and imaging studies Basic Metabolic Panel:  Recent Labs Lab 04/25/16 2021 04/26/16 0434 04/27/16 0347  NA 136 137 137  K 4.0 3.7 3.9  CL 100* 104 105  CO2 26 24 25   GLUCOSE 142* 168* 100*  BUN 10 11 14   CREATININE 0.96  1.12* 1.07*  CALCIUM 9.2 8.6* 8.0*   Liver Function Tests:  Recent Labs Lab 04/25/16 2021 04/26/16 0434  AST 26 25  ALT 15 14  ALKPHOS 60 53  BILITOT 0.9 1.0  PROT 7.1 6.4*  ALBUMIN 4.4 3.4*    Recent Labs Lab 04/25/16 2021  LIPASE 42   No results for input(s): AMMONIA in the last 168 hours. Coagulation Profile: No results for input(s): INR, PROTIME in the last 168 hours. CBC:  Recent Labs Lab 04/25/16 2021 04/26/16 0434 04/27/16 0347  WBC 16.0* 23.1* 21.5*  NEUTROABS  --  20.6*  --   HGB 14.8 14.1  12.3  HCT 43.4 40.5 36.3  MCV 92.1 92.5 93.6  PLT 285 290 253   Cardiac Enzymes: No results for input(s): CKTOTAL, CKMB, CKMBINDEX, TROPONINI in the last 168 hours. BNP: Invalid input(s): POCBNP CBG: No results for input(s): GLUCAP in the last 168 hours. HbA1C: No results for input(s): HGBA1C in the last 72 hours. Urine analysis:    Component Value Date/Time   COLORURINE AMBER (A) 04/25/2016 2146   APPEARANCEUR TURBID (A) 04/25/2016 2146   APPEARANCEUR Clear 10/01/2015 1233   LABSPEC 1.015 04/25/2016 2146   PHURINE 5.0 04/25/2016 2146   GLUCOSEU NEGATIVE 04/25/2016 2146   HGBUR MODERATE (A) 04/25/2016 2146   BILIRUBINUR NEGATIVE 04/25/2016 2146   BILIRUBINUR Negative 10/01/2015 1233   KETONESUR NEGATIVE 04/25/2016 2146   PROTEINUR >=300 (A) 04/25/2016 2146   UROBILINOGEN negative 09/30/2014 1049   UROBILINOGEN 0.2 07/07/2014 0537   NITRITE POSITIVE (A) 04/25/2016 2146   LEUKOCYTESUR LARGE (A) 04/25/2016 2146   LEUKOCYTESUR Negative 10/01/2015 1233   Sepsis Labs: @LABRCNTIP (procalcitonin:4,lacticidven:4) ) Recent Results (from the past 240 hour(s))  Urine culture     Status: Abnormal (Preliminary result)   Collection Time: 04/25/16  9:46 PM  Result Value Ref Range Status   Specimen Description URINE, CLEAN CATCH  Final   Special Requests NONE  Final   Culture (A)  Final    >=100,000 COLONIES/mL ESCHERICHIA COLI SUSCEPTIBILITIES TO FOLLOW Performed at [facility]    Report Status PENDING  Incomplete     Scheduled Meds: . cefTRIAXone (ROCEPHIN)  IV  1 g Intravenous Q24H  . gabapentin  300 mg Oral QHS  . lisinopril  10 mg Oral Daily  . polyethylene glycol  17 g Oral Daily   Continuous Infusions:  Procedures/Studies: Dg Abd 1 View  Result Date: 04/21/2016 CLINICAL DATA:  Abdominal pain EXAM: ABDOMEN - 1 VIEW COMPARISON:  11/18/2015 FINDINGS: Nonobstructive bowel gas pattern. Moderate stool burden throughout the colon. No organomegaly or free air.  Surgical changes in the lumbar spine and hips. IMPRESSION: Moderate stool burden.  No acute findings. Electronically Signed   By: Rolm Baptise M.D.   On: 04/21/2016 10:38   US Renal  Result Date: 04/26/2016 CLINICAL DATA:  70 year old female with a history of chronic left-sided hydronephrosis, pyelonephritis EXAM: RENAL / URINARY TRACT ULTRASOUND COMPLETE COMPARISON:  CT 02/08/2016 FINDINGS: Right Kidney: Length: 12.9 cm. Echogenicity similar to that of the adjacent liver parenchyma. No hydronephrosis. Flow confirmed in the hilum. No internal reflectors of the collecting system. Left Kidney: Length: 12.5 cm. Echogenicity relatively similar to that of the contralateral kidney tissue. Moderate hydronephrosis which was present on the prior CT study. Flow confirmed in the hilum of the left kidney. Bladder: Appears normal for degree of bladder distention. IMPRESSION: Persisting hydronephrosis of the left kidney which was present on the CT dated 02/08/2016. Unremarkable sonographic survey of the  right kidney. Signed, Dulcy Fanny. Earleen Newport, DO [facility] Electronically Signed   By: Corrie Mckusick D.O.   On: 04/26/2016 21:01    Havannah Streat, DO  [facility] Pager [phone]  If 7PM-7AM, please contact night-coverage [email or web address] Password [email or web address] 04/27/2016, 6:58 PM   LOS: 1 day

## 2016-04-28 ENCOUNTER — Inpatient Hospital Stay (HOSPITAL_COMMUNITY): Payer: Medicare HMO

## 2016-04-28 DIAGNOSIS — I10 Essential (primary) hypertension: Secondary | ICD-10-CM

## 2016-04-28 LAB — URINE CULTURE

## 2016-04-28 LAB — CBC
HEMATOCRIT: 33.1 % — AB (ref 36.0–46.0)
HEMOGLOBIN: 11.3 g/dL — AB (ref 12.0–15.0)
MCH: 31.9 pg (ref 26.0–34.0)
MCHC: 34.1 g/dL (ref 30.0–36.0)
MCV: 93.5 fL (ref 78.0–100.0)
Platelets: 243 10*3/uL (ref 150–400)
RBC: 3.54 MIL/uL — AB (ref 3.87–5.11)
RDW: 12.9 % (ref 11.5–15.5)
WBC: 11.9 10*3/uL — ABNORMAL HIGH (ref 4.0–10.5)

## 2016-04-28 LAB — BASIC METABOLIC PANEL
ANION GAP: 6 (ref 5–15)
BUN: 11 mg/dL (ref 6–20)
CO2: 25 mmol/L (ref 22–32)
Calcium: 8.2 mg/dL — ABNORMAL LOW (ref 8.9–10.3)
Chloride: 109 mmol/L (ref 101–111)
Creatinine, Ser: 0.66 mg/dL (ref 0.44–1.00)
GLUCOSE: 119 mg/dL — AB (ref 65–99)
POTASSIUM: 3.4 mmol/L — AB (ref 3.5–5.1)
Sodium: 140 mmol/L (ref 135–145)

## 2016-04-28 MED ORDER — CEFUROXIME AXETIL 500 MG PO TABS: 500.0000 mg | ORAL_TABLET | Freq: Two times a day (BID) | ORAL | 0 refills | Status: DC

## 2016-04-28 MED ORDER — POTASSIUM CHLORIDE CRYS ER 20 MEQ PO TBCR
20.0000 meq | EXTENDED_RELEASE_TABLET | Freq: Once | ORAL | Status: AC
  Administered 2016-04-28: 20 meq via ORAL
  Filled 2016-04-28: qty 1

## 2016-04-28 NOTE — Progress Notes (Deleted)
NG has had 50cc of output from 2100-0630. Hortencia Conradi RN

## 2016-04-28 NOTE — Discharge Summary (Signed)
Physician Discharge Summary  [name] [id number] DOB: Jan 24, 1946 DOA: 04/25/2016  PCP: [name], MD  Admit date: 04/25/2016 Discharge date: 04/29/16  Admitted From:Home Disposition:  Home Recommendations for Outpatient Follow-up:  1. Follow up with PCP in 1-2 weeks 2. Please obtain BMP/CBC in one week   Home Health:yes Equipment/Devices:HHPT  Discharge Condition:stable CODE STATUS:FULL Diet recommendation: Heart Healthy   Brief/Interim Summary: 70 year old female with a history of hypertension, retroperitoneal fibrosis with chronic left hydronephrosis, recurrent UTI, hyperlipidemia, endometrioid adenocarcinoma status post hysterectomy presents to the emergency department with 2-3 day history of nausea, vomiting, and left flank pain. The patient has subjective fevers and chills. She also complained of dysuria and foul-smelling urine. Urinalysis at the time of admission revealed TNTC WBC. The patient was started on empiric ceftriaxone. Renal ultrasound showed persistent left hydronephrosis. Urology was consulted.  They did not feel patient needed any intervention at this time.  Discharge Diagnoses:  Pyelonephritis -Continue ceftriaxone pending culture data -pansensitive EColi -Nausea and vomiting resolved -Saline lock IV fluids  -home with cefuroxime x 10 more days to complete 14 days of therapy -WBC improving  Chronic LEFT Hydronephrosis / Retroperitoneal Fibrosis-  -appreciate urology--Dr. Wyatt Mage surgical intervention at this time; f/u in office -chronic left hydro stemming from complicaitons from anterior lumbar fusion 2009. Previously managed with balloon dilation, then dedicated ureterolysis 2010, but likely still some component of partial obstruction. Renogram 2016 with left 23% relative function and T1/2 with partial obstruction at 22 min. She has h/o numerous other abdominal surgeries includin hernia repair with mesh  2014.  Hypertension -Discontinue lisinopril/HCTZ due to soft blood pressure -BP remains well controlled off of meds -will not restart lisinopril/HCTZ-->follow up with PCP for BP check  Impaired glucose tolerance -07/16/2015, A1c 6.3  Stress Urinary Incontinence- s/p sling 2005.  Chronic Back Pain -pt c/o of chronic progressive worsening -chronic percocet at home -s/p lumbar fusion x 3 -she sees neurosurgery Dr. Brayton El will need out pt followup  -no saddle anesthesia, no bladder incontinence or leg weakness -12/21 L-spine xray--no acute abnormalities, severe DJD T12-L1 and L1-2 -continue gabapentin -PT eval   Discharge Instructions   Allergies as of 04/28/2016      Reactions   Cashew Nut Oil Hives   Sulfamethoxazole    Mouth sores   Zofran Hives, Nausea Only      Medication List    STOP taking these medications   lisinopril-hydrochlorothiazide 10-12.5 MG tablet Commonly known as:  PRINZIDE,ZESTORETIC   mupirocin ointment 2 % Commonly known as:  BACTROBAN     TAKE these medications   amitriptyline 25 MG tablet Commonly known as:  ELAVIL TAKE ONE TABLET BY MOUTH ONCE DAILY AT BEDTIME   cefUROXime 500 MG tablet Commonly known as:  CEFTIN Take 1 tablet (500 mg total) by mouth 2 (two) times daily with a meal.   gabapentin 300 MG capsule Commonly known as:  NEURONTIN Take 1 capsule (300 mg total) by mouth at bedtime.   meloxicam 15 MG tablet Commonly known as:  MOBIC TAKE 1 TABLET DAILY   oxyCODONE-acetaminophen 5-325 MG tablet Commonly known as:  PERCOCET/ROXICET Take by mouth every 6 (six) hours as needed for severe pain.   polyethylene glycol powder powder Commonly known as:  GLYCOLAX/MIRALAX Take 17 g by mouth daily.   sodium phosphate 7-19 GM/118ML Enem Place 133 mLs (1 enema total) rectally daily as needed for severe constipation.       Allergies  Allergen Reactions  . Cashew Nut Oil Hives  .  Sulfamethoxazole     Mouth sores  .  Zofran Hives and Nausea Only    Consultations:  Urology--Manny   Procedures/Studies: Dg Lumbar Spine 2-3 Views  Result Date: 04/28/2016 CLINICAL DATA:  Chronic low back pain which has worsened over the past 6 months. History of prior lumbar surgery. EXAM: LUMBAR SPINE - 2-3 VIEW COMPARISON:  CT abdomen and pelvis 02/08/2016. FINDINGS: Again seen is postoperative change of L2-5 fusion. Marked loss of disc space height and endplate sclerosis are again seen at T12-L1 and L1-2. 0.4 cm retrolisthesis L1 on L2 is unchanged. Facet arthropathy L5-S1 noted. Aortic atherosclerosis is identified. Bilateral hip replacements are partially imaged. IMPRESSION: No acute abnormality or change in the appearance lumbar spine. Status post L2-5 fusion. Severe appearing degenerative disc disease T12-L1 and L1-2. Atherosclerosis. Electronically Signed   By: Inge Rise M.D.   On: 04/28/2016 14:48   Dg Abd 1 View  Result Date: 04/21/2016 CLINICAL DATA:  Abdominal pain EXAM: ABDOMEN - 1 VIEW COMPARISON:  11/18/2015 FINDINGS: Nonobstructive bowel gas pattern. Moderate stool burden throughout the colon. No organomegaly or free air. Surgical changes in the lumbar spine and hips. IMPRESSION: Moderate stool burden.  No acute findings. Electronically Signed   By: Rolm Baptise M.D.   On: 04/21/2016 10:38   US Renal  Result Date: 04/26/2016 CLINICAL DATA:  70 year old female with a history of chronic left-sided hydronephrosis, pyelonephritis EXAM: RENAL / URINARY TRACT ULTRASOUND COMPLETE COMPARISON:  CT 02/08/2016 FINDINGS: Right Kidney: Length: 12.9 cm. Echogenicity similar to that of the adjacent liver parenchyma. No hydronephrosis. Flow confirmed in the hilum. No internal reflectors of the collecting system. Left Kidney: Length: 12.5 cm. Echogenicity relatively similar to that of the contralateral kidney tissue. Moderate hydronephrosis which was present on the prior CT study. Flow confirmed in the hilum of the left  kidney. Bladder: Appears normal for degree of bladder distention. IMPRESSION: Persisting hydronephrosis of the left kidney which was present on the CT dated 02/08/2016. Unremarkable sonographic survey of the right kidney. Signed, Dulcy Fanny. Earleen Newport, DO [facility] Electronically Signed   By: Corrie Mckusick D.O.   On: 04/26/2016 21:01        Discharge Exam: Vitals:   04/28/16 0434 04/28/16 1349  BP: 126/61 (!) 131/59  Pulse: 66 (!) 57  Resp: 18 16  Temp: 98.4 F (36.9 C) 98.7 F (37.1 C)   Vitals:   04/27/16 2052 04/28/16 0434 04/28/16 0540 04/28/16 1349  BP: (!) 119/58 126/61  (!) 131/59  Pulse: 87 66  (!) 57  Resp: 16 18  16   Temp: 98.8 F (37.1 C) 98.4 F (36.9 C)  98.7 F (37.1 C)  TempSrc: Oral Oral  Oral  SpO2: 98% 99%  97%  Weight:   85.5 kg (188 lb 8 oz)   Height:        General: Pt is alert, awake, not in acute distress Cardiovascular: RRR, S1/S2 +, no rubs, no gallops Respiratory: fine bibasilar crackles, no wheeze Abdominal: Soft, mild L-flank tenderness, ND, bowel sounds + Extremities: trace LE edema, no cyanosis   The results of significant diagnostics from this hospitalization (including imaging, microbiology, ancillary and laboratory) are listed below for reference.    Significant Diagnostic Studies: Dg Lumbar Spine 2-3 Views  Result Date: 04/28/2016 CLINICAL DATA:  Chronic low back pain which has worsened over the past 6 months. History of prior lumbar surgery. EXAM: LUMBAR SPINE - 2-3 VIEW COMPARISON:  CT abdomen and pelvis 02/08/2016.  FINDINGS: Again seen is postoperative change of L2-5 fusion. Marked loss of disc space height and endplate sclerosis are again seen at T12-L1 and L1-2. 0.4 cm retrolisthesis L1 on L2 is unchanged. Facet arthropathy L5-S1 noted. Aortic atherosclerosis is identified. Bilateral hip replacements are partially imaged. IMPRESSION: No acute abnormality or change in the  appearance lumbar spine. Status post L2-5 fusion. Severe appearing degenerative disc disease T12-L1 and L1-2. Atherosclerosis. Electronically Signed   By: Inge Rise M.D.   On: 04/28/2016 14:48   Dg Abd 1 View  Result Date: 04/21/2016 CLINICAL DATA:  Abdominal pain EXAM: ABDOMEN - 1 VIEW COMPARISON:  11/18/2015 FINDINGS: Nonobstructive bowel gas pattern. Moderate stool burden throughout the colon. No organomegaly or free air. Surgical changes in the lumbar spine and hips. IMPRESSION: Moderate stool burden.  No acute findings. Electronically Signed   By: Rolm Baptise M.D.   On: 04/21/2016 10:38   US Renal  Result Date: 04/26/2016 CLINICAL DATA:  70 year old female with a history of chronic left-sided hydronephrosis, pyelonephritis EXAM: RENAL / URINARY TRACT ULTRASOUND COMPLETE COMPARISON:  CT 02/08/2016 FINDINGS: Right Kidney: Length: 12.9 cm. Echogenicity similar to that of the adjacent liver parenchyma. No hydronephrosis. Flow confirmed in the hilum. No internal reflectors of the collecting system. Left Kidney: Length: 12.5 cm. Echogenicity relatively similar to that of the contralateral kidney tissue. Moderate hydronephrosis which was present on the prior CT study. Flow confirmed in the hilum of the left kidney. Bladder: Appears normal for degree of bladder distention. IMPRESSION: Persisting hydronephrosis of the left kidney which was present on the CT dated 02/08/2016. Unremarkable sonographic survey of the right kidney. Signed, Dulcy Fanny. Earleen Newport, DO [facility] Electronically Signed   By: Corrie Mckusick D.O.   On: 04/26/2016 21:01     Microbiology: Recent Results (from the past 240 hour(s))  Urine culture     Status: Abnormal   Collection Time: 04/25/16  9:46 PM  Result Value Ref Range Status   Specimen Description URINE, CLEAN CATCH  Final   Special Requests NONE  Final   Culture >=100,000 COLONIES/mL ESCHERICHIA COLI (A)  Final    Report Status 04/28/2016 FINAL  Final   Organism ID, Bacteria ESCHERICHIA COLI (A)  Final      Susceptibility   Escherichia coli - MIC*    AMPICILLIN 8 SENSITIVE Sensitive     CEFAZOLIN <=4 SENSITIVE Sensitive     CEFTRIAXONE <=1 SENSITIVE Sensitive     CIPROFLOXACIN <=0.25 SENSITIVE Sensitive     GENTAMICIN <=1 SENSITIVE Sensitive     IMIPENEM <=0.25 SENSITIVE Sensitive     NITROFURANTOIN <=16 SENSITIVE Sensitive     TRIMETH/SULFA <=20 SENSITIVE Sensitive     AMPICILLIN/SULBACTAM 4 SENSITIVE Sensitive     PIP/TAZO <=4 SENSITIVE Sensitive     Extended ESBL NEGATIVE Sensitive     * >=100,000 COLONIES/mL ESCHERICHIA COLI     Labs: Basic Metabolic Panel:  Recent Labs Lab 04/25/16 2021 04/26/16 0434 04/27/16 0347 04/28/16 0414  NA 136 137 137 140  K 4.0 3.7 3.9 3.4*  CL 100* 104 105 109  CO2 26 24 25 25   GLUCOSE 142* 168* 100* 119*  BUN 10 11 14 11   CREATININE 0.96 1.12* 1.07* 0.66  CALCIUM 9.2 8.6* 8.0* 8.2*   Liver Function Tests:  Recent Labs Lab 04/25/16 2021 04/26/16 0434  AST 26 25  ALT 15 14  ALKPHOS 60 53  BILITOT 0.9 1.0  PROT 7.1 6.4*  ALBUMIN 4.4 3.4*  Recent Labs Lab 04/25/16 2021  LIPASE 42   No results for input(s): AMMONIA in the last 168 hours. CBC:  Recent Labs Lab 04/25/16 2021 04/26/16 0434 04/27/16 0347 04/28/16 0414  WBC 16.0* 23.1* 21.5* 11.9*  NEUTROABS  --  20.6*  --   --   HGB 14.8 14.1 12.3 11.3*  HCT 43.4 40.5 36.3 33.1*  MCV 92.1 92.5 93.6 93.5  PLT 285 290 253 243   Cardiac Enzymes: No results for input(s): CKTOTAL, CKMB, CKMBINDEX, TROPONINI in the last 168 hours. BNP: Invalid input(s): POCBNP CBG: No results for input(s): GLUCAP in the last 168 hours.  Time coordinating discharge:  Greater than 30 minutes  Signed:  Lamonica Trueba, DO [facility] Pager: [phone] 04/28/2016, 4:34 PM

## 2016-04-29 ENCOUNTER — Telehealth: Payer: Self-pay | Admitting: *Deleted

## 2016-04-29 DIAGNOSIS — Z87448 Personal history of other diseases of urinary system: Secondary | ICD-10-CM

## 2016-04-29 DIAGNOSIS — M545 Low back pain, unspecified: Secondary | ICD-10-CM

## 2016-04-29 DIAGNOSIS — G8929 Other chronic pain: Secondary | ICD-10-CM

## 2016-04-29 LAB — HEMOGLOBIN A1C
Hgb A1c MFr Bld: 5.7 % — ABNORMAL HIGH (ref 4.8–5.6)
Mean Plasma Glucose: 117 mg/dL

## 2016-04-29 MED ORDER — CEFUROXIME AXETIL 500 MG PO TABS: 500.0000 mg | ORAL_TABLET | Freq: Two times a day (BID) | ORAL | 0 refills | Status: AC

## 2016-04-29 NOTE — Telephone Encounter (Signed)
Left message for patient to call back to set up a hospital follow up with Dr. Evette Doffing.

## 2016-04-29 NOTE — Evaluation (Signed)
Physical Therapy Evaluation Patient Details Name: [name] MRN: [id number] DOB: 25-Dec-1945 Today's Date: 04/29/2016   History of Present Illness  70 year old female with a history of hypertension, retroperitoneal fibrosis with chronic left hydronephrosis, recurrent UTI, hyperlipidemia, endometrioid adenocarcinoma status post hysterectomy presents to the emergency department with 2-3 day history of nausea, vomiting, and left flank pain. Dx of pyelonephritis.  Clinical Impression  Pt is at baseline with mobility. She ambulated 180' with RW, no loss of balance, and is independent with bed mobility and transfers. From PT standpoint she is ready to DC home where she has 24* assistance from her children. No further PT indicated, PT signing off.     Follow Up Recommendations No PT follow up    Equipment Recommendations  None recommended by PT    Recommendations for Other Services       Precautions / Restrictions Precautions Precautions: None Precaution Comments: tripped over shoe, otherwise no falls this past year Restrictions Weight Bearing Restrictions: No      Mobility  Bed Mobility Overal bed mobility: Independent                Transfers Overall transfer level: Independent                  Ambulation/Gait Ambulation/Gait assistance: Modified independent (Device/Increase time) Ambulation Distance (Feet): 180 Feet Assistive device: Rolling walker (2 wheeled) Gait Pattern/deviations: WFL(Within Functional Limits)   Gait velocity interpretation: at or above normal speed for age/gender General Gait Details: steady with RW, no LOB  Stairs            Wheelchair Mobility    Modified Rankin (Stroke Patients Only)       Balance Overall balance assessment: Modified Independent                                           Pertinent Vitals/Pain Pain Assessment: 0-10 Pain Score: 4  Pain Location: chronic LBP Pain Descriptors /  Indicators: Aching Pain Intervention(s): Limited activity within patient's tolerance;Monitored during session;Premedicated before session    Home Living Family/patient expects to be discharged to:: Private residence Living Arrangements: Children Available Help at Discharge: Available 24 hours/day;Family Type of Home: House Home Access: Ramped entrance     Home Layout: Two level Home Equipment: Environmental consultant - 2 wheels;Cane - single point;Tub bench      Prior Function Level of Independence: Independent with assistive device(s)         Comments: independent bathing/dressing, drives, can cook "a little and do laundry", walks with RW in morning before pain medicine for back kicks in then walks without AD in home, uses cane going out     Hand Dominance   Dominant Hand: Right    Extremity/Trunk Assessment   Upper Extremity Assessment Upper Extremity Assessment: Overall WFL for tasks assessed    Lower Extremity Assessment Lower Extremity Assessment: Overall WFL for tasks assessed    Cervical / Trunk Assessment Cervical / Trunk Assessment: Normal  Communication   Communication: No difficulties  Cognition Arousal/Alertness: Awake/alert Behavior During Therapy: WFL for tasks assessed/performed Overall Cognitive Status: Within Functional Limits for tasks assessed                      General Comments      Exercises     Assessment/Plan    PT Assessment Patent does not need any  further PT services  PT Problem List            PT Treatment Interventions      PT Goals (Current goals can be found in the Care Plan section)  Acute Rehab PT Goals Patient Stated Goal: return home PT Goal Formulation: All assessment and education complete, DC therapy    Frequency     Barriers to discharge        Co-evaluation               End of Session Equipment Utilized During Treatment: Gait belt Activity Tolerance: Patient tolerated treatment well Patient left: in  chair;with call bell/phone within reach Nurse Communication: Mobility status         Time: 1006-1020 PT Time Calculation (min) (ACUTE ONLY): 14 min   Charges:   PT Evaluation $PT Eval Low Complexity: 1 Procedure     PT G Codes:        [name] 04/29/2016, 10:25 AM [phone]

## 2016-04-29 NOTE — Progress Notes (Signed)
Discharge instructions reviewed with patient. Patient verbalized understanding. Patient to be discharged via private vehicle , awaiting ride.

## 2016-04-29 NOTE — Progress Notes (Signed)
PROGRESS NOTE    [name]  [id number] DOB: 12-09-1945 DOA: 04/25/2016 PCP: [name], MD    Brief Narrative:  70 year old female with a history of hypertension, retroperitoneal fibrosis with chronic left hydronephrosis, recurrent UTI, hyperlipidemia, endometrioid adenocarcinoma status post hysterectomy presents to the emergency department with 2-3 day history of nausea, vomiting, and left flank pain. The patient has subjective fevers and chills. She also complained of dysuria and foul-smelling urine. Urinalysis at the time of admission revealed TNTC WBC. The patient was started on empiric ceftriaxone. Renal ultrasound showed persistent left hydronephrosis. Urology was consulted.  They did not feel patient needed any intervention at this time.   Assessment & Plan:   Principal Problem:   Acute pyelonephritis Active Problems:   Hypertension   Retroperitoneal fibrosis in setting of chronic abscess at ureter 2010   Intractable abdominal pain   Acute cystitis without hematuria   Abdominal pain   Left flank pain  Pyelonephritis/ ECOLI UTI - Patient was admitted with concerns for acute pyelonephritis has had presented with bilateral CVA tenderness left greater than right. Urinalysis which was done had large leukocytes and positive for nitrites too numerous to count. Patient was placed empirically on IV Rocephin. Urine cultures obtained. Urine cultures were positive for pansensitive EColi -Nausea and vomiting resolved. - Patient was started on a diet which she tolerated. -IV fluids with saline lock. -Patient will be discharged home with cefuroxime x 10 more days to complete 14 days of therapy. -WBC trended down and was 11.9 from 23.1 during the hospitalization. - Patient will be discharged in stable and improved condition.  Chronic LEFT Hydronephrosis / Retroperitoneal Fibrosis-  -appreciate urology--Dr. Wyatt Mage surgical intervention at this time; f/u in  office -chronic left hydro stemming from complicaitons from anterior lumbar fusion 2009. Previously managed with balloon dilation, then dedicated ureterolysis 2010, but likely still some component of partial obstruction. Renogram 2016 with left 23% relative function and T1/2 with partial obstruction at 22 min. She has h/o numerous other abdominal surgeries includin hernia repair with mesh 2014.  Hypertension -Discontinued lisinopril/HCTZ due to soft blood pressure -BP remained well controlled off of meds -will not restart lisinopril/HCTZ-->follow up with PCP for BP check  Impaired glucose tolerance -07/16/2015, A1c 6.3  Stress Urinary Incontinence- s/p sling 2005.  Chronic Back Pain -pt c/o of chronic progressive worsening -chronic percocet at home -s/p lumbar fusion x 3 -she sees neurosurgery Dr. Brayton El will need out pt followup  -no saddle anesthesia, no bladder incontinence or leg weakness -12/21 L-spine xray--no acute abnormalities, severe DJD T12-L1 and L1-2 -continued on gabapentin -PT eval   DVT prophylaxis: SCDs Code Status: Full Family Communication: Updated patient at bedside. No family present. Disposition Plan: Home   Consultants:   Urology: Dr Tresa Moore 04/26/2016  Procedures:  Plain films L-spine 04/28/2016 Renal ultrasound 04/26/2016  Antimicrobials:   IV Rocephin 04/25/2016   Subjective: Patient states abdominal pain has improved. No nausea. No emesis. Tolerating diet.  Objective: Vitals:   04/28/16 0540 04/28/16 1349 04/28/16 2104 04/29/16 0539  BP:  (!) 131/59 140/73 (!) 143/76  Pulse:  (!) 57 (!) 57 62  Resp:  16 16 16   Temp:  98.7 F (37.1 C) 98.7 F (37.1 C) 98.7 F (37.1 C)  TempSrc:  Oral Oral Oral  SpO2:  97% 97% 96%  Weight: 85.5 kg (188 lb 8 oz)   85.5 kg (188 lb 8 oz)  Height:        Intake/Output Summary (Last 24 hours)  at 04/29/16 1132 Last data filed at 04/29/16 0951  Gross per 24 hour  Intake              950 ml   Output             1800 ml  Net             -850 ml   Filed Weights   04/27/16 0530 04/28/16 0540 04/29/16 0539  Weight: 86.6 kg (191 lb) 85.5 kg (188 lb 8 oz) 85.5 kg (188 lb 8 oz)    Examination:  General exam: Appears calm and comfortable  Respiratory system: Clear to auscultation. Respiratory effort normal. Cardiovascular system: S1 & S2 heard, RRR. No JVD, murmurs, rubs, gallops or clicks. No pedal edema. Gastrointestinal system: Abdomen is nondistended, soft and nontender. No organomegaly or masses felt. Normal bowel sounds heard. Decreased left CVA TTP. Central nervous system: Alert and oriented. No focal neurological deficits. Extremities: Symmetric 5 x 5 power. Skin: No rashes, lesions or ulcers Psychiatry: Judgement and insight appear normal. Mood & affect appropriate.     Data Reviewed: I have personally reviewed following labs and imaging studies  CBC:  Recent Labs Lab 04/25/16 2021 04/26/16 0434 04/27/16 0347 04/28/16 0414  WBC 16.0* 23.1* 21.5* 11.9*  NEUTROABS  --  20.6*  --   --   HGB 14.8 14.1 12.3 11.3*  HCT 43.4 40.5 36.3 33.1*  MCV 92.1 92.5 93.6 93.5  PLT 285 290 253 0000000   Basic Metabolic Panel:  Recent Labs Lab 04/25/16 2021 04/26/16 0434 04/27/16 0347 04/28/16 0414  NA 136 137 137 140  K 4.0 3.7 3.9 3.4*  CL 100* 104 105 109  CO2 26 24 25 25   GLUCOSE 142* 168* 100* 119*  BUN 10 11 14 11   CREATININE 0.96 1.12* 1.07* 0.66  CALCIUM 9.2 8.6* 8.0* 8.2*   GFR: Estimated Creatinine Clearance: 62.6 mL/min (by C-G formula based on SCr of 0.66 mg/dL). Liver Function Tests:  Recent Labs Lab 04/25/16 2021 04/26/16 0434  AST 26 25  ALT 15 14  ALKPHOS 60 53  BILITOT 0.9 1.0  PROT 7.1 6.4*  ALBUMIN 4.4 3.4*    Recent Labs Lab 04/25/16 2021  LIPASE 42   No results for input(s): AMMONIA in the last 168 hours. Coagulation Profile: No results for input(s): INR, PROTIME in the last 168 hours. Cardiac Enzymes: No results for  input(s): CKTOTAL, CKMB, CKMBINDEX, TROPONINI in the last 168 hours. BNP (last 3 results) No results for input(s): PROBNP in the last 8760 hours. HbA1C:  Recent Labs  04/28/16 0414  HGBA1C 5.7*   CBG: No results for input(s): GLUCAP in the last 168 hours. Lipid Profile: No results for input(s): CHOL, HDL, LDLCALC, TRIG, CHOLHDL, LDLDIRECT in the last 72 hours. Thyroid Function Tests: No results for input(s): TSH, T4TOTAL, FREET4, T3FREE, THYROIDAB in the last 72 hours. Anemia Panel: No results for input(s): VITAMINB12, FOLATE, FERRITIN, TIBC, IRON, RETICCTPCT in the last 72 hours. Sepsis Labs: No results for input(s): PROCALCITON, LATICACIDVEN in the last 168 hours.  Recent Results (from the past 240 hour(s))  Urine culture     Status: Abnormal   Collection Time: 04/25/16  9:46 PM  Result Value Ref Range Status   Specimen Description URINE, CLEAN CATCH  Final   Special Requests NONE  Final   Culture >=100,000 COLONIES/mL ESCHERICHIA COLI (A)  Final   Report Status 04/28/2016 FINAL  Final   Organism ID, Bacteria ESCHERICHIA COLI (A)  Final  Susceptibility   Escherichia coli - MIC*    AMPICILLIN 8 SENSITIVE Sensitive     CEFAZOLIN <=4 SENSITIVE Sensitive     CEFTRIAXONE <=1 SENSITIVE Sensitive     CIPROFLOXACIN <=0.25 SENSITIVE Sensitive     GENTAMICIN <=1 SENSITIVE Sensitive     IMIPENEM <=0.25 SENSITIVE Sensitive     NITROFURANTOIN <=16 SENSITIVE Sensitive     TRIMETH/SULFA <=20 SENSITIVE Sensitive     AMPICILLIN/SULBACTAM 4 SENSITIVE Sensitive     PIP/TAZO <=4 SENSITIVE Sensitive     Extended ESBL NEGATIVE Sensitive     * >=100,000 COLONIES/mL ESCHERICHIA COLI         Radiology Studies: Dg Lumbar Spine 2-3 Views  Result Date: 04/28/2016 CLINICAL DATA:  Chronic low back pain which has worsened over the past 6 months. History of prior lumbar surgery. EXAM: LUMBAR SPINE - 2-3 VIEW COMPARISON:  CT abdomen and pelvis 02/08/2016. FINDINGS: Again seen is  postoperative change of L2-5 fusion. Marked loss of disc space height and endplate sclerosis are again seen at T12-L1 and L1-2. 0.4 cm retrolisthesis L1 on L2 is unchanged. Facet arthropathy L5-S1 noted. Aortic atherosclerosis is identified. Bilateral hip replacements are partially imaged. IMPRESSION: No acute abnormality or change in the appearance lumbar spine. Status post L2-5 fusion. Severe appearing degenerative disc disease T12-L1 and L1-2. Atherosclerosis. Electronically Signed   By: Inge Rise M.D.   On: 04/28/2016 14:48        Scheduled Meds: . cefTRIAXone (ROCEPHIN)  IV  1 g Intravenous Q24H  . gabapentin  300 mg Oral QHS  . polyethylene glycol  17 g Oral Daily   Continuous Infusions:   LOS: 3 days    Time spent: 78 mins    THOMPSON,DANIEL, MD [facility] Pager [phone]  If 7PM-7AM, please contact night-coverage [email or web address] Password [facility] 04/29/2016, 11:32 AM

## 2016-05-03 ENCOUNTER — Telehealth: Payer: Self-pay | Admitting: *Deleted

## 2016-05-03 NOTE — Telephone Encounter (Signed)
Call Completed and Appointment Scheduled: Yes, Date: 05/11/16 with Dr Emiliano Dyer INFORMATION Date of Discharge:04/29/16  Discharge Facility: [facility]  Principal Discharge Diagnosis: Pyelonephritis  Patient and/or caregiver is knowledgeable of his/her condition(s) and treatment: Yes  MEDICATION RECONCILIATION Current medication list reviewed with patient:Yes  Outpatient Encounter Prescriptions as of 05/03/2016  Medication Sig  . amitriptyline (ELAVIL) 25 MG tablet TAKE ONE TABLET BY MOUTH ONCE DAILY AT BEDTIME  . cefUROXime (CEFTIN) 500 MG tablet Take 1 tablet (500 mg total) by mouth 2 (two) times daily with a meal. Take for 10 days then stop.  Marland Kitchen gabapentin (NEURONTIN) 300 MG capsule Take 1 capsule (300 mg total) by mouth at bedtime.  . meloxicam (MOBIC) 15 MG tablet TAKE 1 TABLET DAILY (Patient not taking: Reported on 04/25/2016)  . oxyCODONE-acetaminophen (PERCOCET/ROXICET) 5-325 MG tablet Take by mouth every 6 (six) hours as needed for severe pain.  . polyethylene glycol powder (GLYCOLAX/MIRALAX) powder Take 17 g by mouth daily.  . sodium phosphate (FLEET) 7-19 GM/118ML ENEM Place 133 mLs (1 enema total) rectally daily as needed for severe constipation.   No facility-administered encounter medications on file as of 05/03/2016.     Discharge Medications reviewed and reconciled with current medications.yes  Patient is able to obtain needed medications:Yes  ACTIVITIES OF DAILY LIVING  Is the patient able to perform his/her own ADLs: Yes.    Patient is receiving home health services: No.  PATIENT EDUCATION Questions/Concerns Discussed: patient is tolerating her antibiotic well and she reports that her blood pressure has not been elevated at home. She was instructed at the hospital to d/c her blood pressure mediation. She will f/u sooner with [place] if necessary.  Will need a CBC and BMP at appt per hospital discharge note.

## 2016-05-11 ENCOUNTER — Encounter: Payer: Self-pay | Admitting: Pediatrics

## 2016-05-11 ENCOUNTER — Ambulatory Visit (INDEPENDENT_AMBULATORY_CARE_PROVIDER_SITE_OTHER): Payer: Medicare HMO | Admitting: Pediatrics

## 2016-05-11 VITALS — BP 154/75 | HR 57 | Temp 96.8°F | Ht 60.0 in | Wt 188.0 lb

## 2016-05-11 DIAGNOSIS — Z87440 Personal history of urinary (tract) infections: Secondary | ICD-10-CM | POA: Diagnosis not present

## 2016-05-11 DIAGNOSIS — Z780 Asymptomatic menopausal state: Secondary | ICD-10-CM

## 2016-05-11 DIAGNOSIS — I10 Essential (primary) hypertension: Secondary | ICD-10-CM

## 2016-05-11 MED ORDER — HYDROCHLOROTHIAZIDE 12.5 MG PO TABS: 12.5000 mg | ORAL_TABLET | Freq: Every day | ORAL | 1 refills | Status: DC

## 2016-05-11 NOTE — Progress Notes (Signed)
  Subjective:   Patient ID: [name], female    DOB: 1945-12-27, 71 y.o.   MRN: [id number] CC: Transitional Care Management  HPI: [name] is a 71 y.o. female presenting for Transitional Care Management  Today's visit is for Transitional Care Management.  The patient was discharged from [facility] on 12/22 with a primary diagnosis of pyelonephritis   Contact with the patient and/or caregiver, by a clinical staff member, was made on 12/26 and was documented as a telephone encounter within the EMR.  Through chart review and discussion with the patient I have determined that management of their condition is of moderate complexity.   HTN: meds stopped while in the hospital for soft BPs Pt denies any lightheadedness Has  Had some swelling in L ankle > R ankle since HCTZ stopped  Relevant past medical, surgical, family and social history reviewed. Allergies and medications reviewed and updated. History  Smoking Status  . Never Smoker  Smokeless Tobacco  . Never Used   ROS: Per HPI   Objective:    BP (!) 154/75   Pulse (!) 57   Temp (!) 96.8 F (36 C) (Oral)   Ht 5' (1.524 m)   Wt 188 lb (85.3 kg)   BMI 36.72 kg/m   Wt Readings from Last 3 Encounters:  05/11/16 188 lb (85.3 kg)  04/29/16 188 lb 8 oz (85.5 kg)  04/21/16 187 lb 6 oz (85 kg)    Gen: NAD, alert, cooperative with exam, NCAT EYES: EOMI, no conjunctival injection, or no icterus CV: NRRR, normal S1/S2, no murmur, distal pulses 2+ b/l Resp: CTABL, no wheezes, normal WOB Abd: +BS, soft, NTND. no guarding or organomegaly Ext: trace edema edema b/l LE, L slightly >R, warm Neuro: Alert and oriented, strength equal b/l UE and LE, coordination grossly normal MSK: normal muscle bulk  Assessment & Plan:  [name] was seen today for transitional care management. I have reviewed hospital records including labs, imaging. No further symptoms of UTI. Feeling well. Will recheck labs.  Diagnoses and all  orders for this visit:  Post-menopausal -     DG [facility] DEXA  Essential hypertension -     hydrochlorothiazide (HYDRODIURIL) 12.5 MG tablet; Take 1 tablet (12.5 mg total) by mouth daily. -     CBC with Differential/Platelet -     BMP8+EGFR  Follow up plan: Return in about 4 weeks (around 06/08/2016). [name], MD [place]

## 2016-05-12 LAB — CBC WITH DIFFERENTIAL/PLATELET
BASOS ABS: 0 10*3/uL (ref 0.0–0.2)
BASOS: 0 %
EOS (ABSOLUTE): 0.1 10*3/uL (ref 0.0–0.4)
Eos: 2 %
Hematocrit: 39.1 % (ref 34.0–46.6)
Hemoglobin: 13 g/dL (ref 11.1–15.9)
IMMATURE GRANS (ABS): 0 10*3/uL (ref 0.0–0.1)
Immature Granulocytes: 0 %
LYMPHS ABS: 3 10*3/uL (ref 0.7–3.1)
Lymphs: 43 %
MCH: 30.7 pg (ref 26.6–33.0)
MCHC: 33.2 g/dL (ref 31.5–35.7)
MCV: 92 fL (ref 79–97)
MONOS ABS: 0.5 10*3/uL (ref 0.1–0.9)
Monocytes: 7 %
NEUTROS ABS: 3.4 10*3/uL (ref 1.4–7.0)
Neutrophils: 48 %
PLATELETS: 371 10*3/uL (ref 150–379)
RBC: 4.24 x10E6/uL (ref 3.77–5.28)
RDW: 13.5 % (ref 12.3–15.4)
WBC: 7 10*3/uL (ref 3.4–10.8)

## 2016-05-12 LAB — BMP8+EGFR
BUN / CREAT RATIO: 17 (ref 12–28)
BUN: 11 mg/dL (ref 8–27)
CHLORIDE: 99 mmol/L (ref 96–106)
CO2: 27 mmol/L (ref 18–29)
Calcium: 9.5 mg/dL (ref 8.7–10.3)
Creatinine, Ser: 0.63 mg/dL (ref 0.57–1.00)
GFR calc non Af Amer: 91 mL/min/{1.73_m2} (ref >59)
GFR, EST AFRICAN AMERICAN: 104 mL/min/{1.73_m2} (ref >59)
GLUCOSE: 84 mg/dL (ref 65–99)
Potassium: 4.7 mmol/L (ref 3.5–5.2)
SODIUM: 142 mmol/L (ref 134–144)

## 2016-05-16 DIAGNOSIS — M461 Sacroiliitis, not elsewhere classified: Secondary | ICD-10-CM | POA: Diagnosis not present

## 2016-05-16 DIAGNOSIS — R69 Illness, unspecified: Secondary | ICD-10-CM | POA: Diagnosis not present

## 2016-05-16 DIAGNOSIS — M545 Low back pain: Secondary | ICD-10-CM | POA: Diagnosis not present

## 2016-05-16 DIAGNOSIS — Z79899 Other long term (current) drug therapy: Secondary | ICD-10-CM | POA: Diagnosis not present

## 2016-05-16 DIAGNOSIS — M961 Postlaminectomy syndrome, not elsewhere classified: Secondary | ICD-10-CM | POA: Diagnosis not present

## 2016-05-23 ENCOUNTER — Ambulatory Visit: Payer: Medicare HMO | Admitting: Pediatrics

## 2016-05-25 ENCOUNTER — Other Ambulatory Visit: Payer: Medicare HMO

## 2016-06-01 DIAGNOSIS — M461 Sacroiliitis, not elsewhere classified: Secondary | ICD-10-CM | POA: Diagnosis not present

## 2016-06-01 DIAGNOSIS — Z6836 Body mass index (BMI) 36.0-36.9, adult: Secondary | ICD-10-CM | POA: Diagnosis not present

## 2016-06-08 ENCOUNTER — Other Ambulatory Visit: Payer: Medicare HMO

## 2016-06-08 ENCOUNTER — Encounter: Payer: Self-pay | Admitting: Family Medicine

## 2016-06-08 ENCOUNTER — Ambulatory Visit: Payer: Medicare HMO | Admitting: Pediatrics

## 2016-06-08 ENCOUNTER — Ambulatory Visit (INDEPENDENT_AMBULATORY_CARE_PROVIDER_SITE_OTHER): Payer: Medicare HMO | Admitting: Family Medicine

## 2016-06-08 VITALS — BP 160/100 | HR 63 | Temp 97.6°F | Ht 60.0 in | Wt 185.0 lb

## 2016-06-08 DIAGNOSIS — L239 Allergic contact dermatitis, unspecified cause: Secondary | ICD-10-CM

## 2016-06-08 DIAGNOSIS — I10 Essential (primary) hypertension: Secondary | ICD-10-CM

## 2016-06-08 NOTE — Progress Notes (Signed)
Subjective:    Patient ID: [name], female    DOB: [birth year], 71 y.o.   MRN: [id number]  HPI Patient here today for a random rash that itches. She states it just pops up in different areas occasionally. This first started about 2 weeks ago. This rash seems to come and go. She is has some trouble with her blood pressure being low and she is been off of the lisinopril for several days. Repeat blood pressure in the room today was 162/100. When she was taken off the lisinopril she did not have any problems with a rash at that time. So we will go ahead and restart the lisinopril. We questioned her about soaps fabric softeners and detergents and she is using Dial and using a fabric softener sheets in the dryer which is bounce. She has had sensitivity to soaps in the past. She is currently using Tide detergent. She does not have any shortness of breath. She was in the hospital with a urinary tract infection back in December and she is not taking any antibiotics.    Patient Active Problem List   Diagnosis Date Noted  . History of hydronephrosis   . Low back pain   . Acute pyelonephritis 04/27/2016  . Acute cystitis without hematuria 04/26/2016  . Abdominal pain 04/26/2016  . Left flank pain   . Intractable abdominal pain 04/25/2016  . Recurrent UTI 02/20/2016  . Peripheral neuropathy ([place]) 02/08/2016  . Depression 02/08/2016  . OA (osteoarthritis) of hip 07/04/2014  . 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 Pseudoarthrosis L3- L5 12/17/2012  . Nocturnal leg cramps 11/13/2012  . HLD (hyperlipidemia) 08/10/2012  . Recurrent ventral incisional hernia s/p lap repair 04/23/2013 04/27/2012  . Obesity (BMI 30-39.9) 04/27/2012  . Chronic pain 04/27/2012  . Hypertension   . History of endometrial cancer s/p TAH BSO June2013 06/15/2011   Outpatient Encounter Prescriptions as of 06/08/2016  Medication Sig  . amitriptyline (ELAVIL) 25 MG tablet  TAKE ONE TABLET BY MOUTH ONCE DAILY AT BEDTIME  . gabapentin (NEURONTIN) 300 MG capsule Take 1 capsule (300 mg total) by mouth at bedtime.  . hydrochlorothiazide (HYDRODIURIL) 12.5 MG tablet Take 1 tablet (12.5 mg total) by mouth daily.  . meloxicam (MOBIC) 15 MG tablet TAKE 1 TABLET DAILY  . oxyCODONE-acetaminophen (PERCOCET/ROXICET) 5-325 MG tablet Take by mouth every 6 (six) hours as needed for severe pain.  . polyethylene glycol powder (GLYCOLAX/MIRALAX) powder Take 17 g by mouth daily.  . sodium phosphate (FLEET) 7-19 GM/118ML ENEM Place 133 mLs (1 enema total) rectally daily as needed for severe constipation.   No facility-administered encounter medications on file as of 06/08/2016.      Review of Systems  Constitutional: Negative.   HENT: Negative.   Eyes: Negative.   Respiratory: Negative.   Cardiovascular: Negative.   Gastrointestinal: Negative.   Endocrine: Negative.   Genitourinary: Negative.   Musculoskeletal: Negative.   Skin: Positive for rash (left thigh).  Allergic/Immunologic: Negative.   Neurological: Negative.   Hematological: Negative.   Psychiatric/Behavioral: Negative.        Objective:   Physical Exam  Constitutional: She is oriented to person, place, and time. She appears well-developed and well-nourished.  Eyes: Conjunctivae and EOM are normal. Pupils are equal, round, and reactive to light. Right eye exhibits no discharge. Left eye exhibits no discharge. No scleral icterus.  Neck: Normal range of motion.  Cardiovascular: Normal rate, regular rhythm and normal heart sounds.  Pulmonary/Chest: Effort normal and breath sounds normal. No respiratory distress. She has no wheezes. She has no rales.  Musculoskeletal: Normal range of motion. She exhibits no edema.  Neurological: She is alert and oriented to person, place, and time.  Skin: Skin is warm and dry. Rash noted.  This is a migratory type rash in can come and go and just a few minutes time. Currently  it is on her left lower leg. In the rash that was on her arm which she came in the office has already resolved.  Psychiatric: She has a normal mood and affect. Her behavior is normal. Judgment and thought content normal.  Nursing note and vitals reviewed.   BP (!) 159/72 (BP Location: Left Arm)   Pulse 63   Temp 97.6 F (36.4 C) (Oral)   Ht 5' (1.524 m)   Wt 185 lb (83.9 kg)   BMI 36.13 kg/m        Assessment & Plan:  1. Essential hypertension -Add back lisinopril 10 mg daily and continue with HCTZ -Watch sodium intake  2. Allergic dermatitis -Take Benadryl 25 mg over-the-counter every 4-6 hours as needed -Change to use scent free fabric softener soaps and detergents and keep the house as cool as possible -Take cooler baths and wear loose fitting clothing -Rewash clothing and detergent that is more dermatology approved and do not use any scented fabric softeners  Patient Instructions  Patient will take Benadryl every 4-6 hours, 25 mg as needed for itching and rash She will change her detergents and fabric softeners and soaps to those that are scent free We recommended [place] or [place], all dermatology approved, and any fabric softener should be scent free Because of her elevated blood pressure on repeat today we will have her to restart the lisinopril 10 mg daily. She has a return appointment with the doctor here and we can follow-up on the blood pressure at that time. She should also watch her sodium intake.  Arrie Senate MD

## 2016-06-08 NOTE — Patient Instructions (Signed)
Patient will take Benadryl every 4-6 hours, 25 mg as needed for itching and rash She will change her detergents and fabric softeners and soaps to those that are scent free We recommended [place] or [place], all dermatology approved, and any fabric softener should be scent free Because of her elevated blood pressure on repeat today we will have her to restart the lisinopril 10 mg daily. She has a return appointment with the doctor here and we can follow-up on the blood pressure at that time. She should also watch her sodium intake.

## 2016-06-09 ENCOUNTER — Ambulatory Visit: Payer: Medicare HMO | Admitting: Family Medicine

## 2016-06-13 ENCOUNTER — Ambulatory Visit (INDEPENDENT_AMBULATORY_CARE_PROVIDER_SITE_OTHER): Payer: Medicare HMO | Admitting: Pediatrics

## 2016-06-13 ENCOUNTER — Encounter: Payer: Self-pay | Admitting: Pediatrics

## 2016-06-13 ENCOUNTER — Ambulatory Visit (INDEPENDENT_AMBULATORY_CARE_PROVIDER_SITE_OTHER): Payer: Medicare HMO

## 2016-06-13 VITALS — BP 138/81 | HR 64 | Temp 97.1°F | Ht 60.0 in | Wt 185.4 lb

## 2016-06-13 DIAGNOSIS — I10 Essential (primary) hypertension: Secondary | ICD-10-CM

## 2016-06-13 DIAGNOSIS — R21 Rash and other nonspecific skin eruption: Secondary | ICD-10-CM | POA: Diagnosis not present

## 2016-06-13 DIAGNOSIS — Z780 Asymptomatic menopausal state: Secondary | ICD-10-CM | POA: Diagnosis not present

## 2016-06-13 DIAGNOSIS — G8929 Other chronic pain: Secondary | ICD-10-CM | POA: Diagnosis not present

## 2016-06-13 DIAGNOSIS — G629 Polyneuropathy, unspecified: Secondary | ICD-10-CM

## 2016-06-13 MED ORDER — TRIAMCINOLONE ACETONIDE 0.025 % EX OINT: 1.0000 "application " | TOPICAL_OINTMENT | Freq: Two times a day (BID) | CUTANEOUS | 0 refills | Status: DC

## 2016-06-13 MED ORDER — GABAPENTIN 300 MG PO CAPS: 300.0000 mg | ORAL_CAPSULE | Freq: Every day | ORAL | 3 refills | Status: DC

## 2016-06-13 NOTE — Patient Instructions (Addendum)
Food Choices for Gastroesophageal Reflux Disease, Adult When you have gastroesophageal reflux disease (GERD), the foods you eat and your eating habits are very important. Choosing the right foods can help ease your discomfort. What guidelines do I need to follow?  Choose fruits, vegetables, whole grains, and low-fat dairy products.  Choose low-fat meat, fish, and poultry.  Limit fats such as oils, salad dressings, butter, nuts, and avocado.  Keep a food diary. This helps you identify foods that cause symptoms.  Avoid foods that cause symptoms. These may be different for everyone.  Eat small meals often instead of 3 large meals a day.  Eat your meals slowly, in a place where you are relaxed.  Limit fried foods.  Cook foods using methods other than frying.  Avoid drinking alcohol.  Avoid drinking large amounts of liquids with your meals.  Avoid bending over or lying down until 2-3 hours after eating. What foods are not recommended? These are some foods and drinks that may make your symptoms worse: Vegetables  Tomatoes. Tomato juice. Tomato and spaghetti sauce. Chili peppers. Onion and garlic. Horseradish. Fruits  Oranges, grapefruit, and lemon (fruit and juice). Meats  High-fat meats, fish, and poultry. This includes hot dogs, ribs, ham, sausage, salami, and bacon. Dairy  Whole milk and chocolate milk. Sour cream. Cream. Butter. Ice cream. Cream cheese. Drinks  Coffee and tea. Bubbly (carbonated) drinks or energy drinks. Condiments  Hot sauce. Barbecue sauce. Sweets/Desserts  Chocolate and cocoa. Donuts. Peppermint and spearmint. Fats and Oils  High-fat foods. This includes French fries and potato chips. Other  Vinegar. Strong spices. This includes black pepper, white pepper, red pepper, cayenne, curry powder, cloves, ginger, and chili powder. The items listed above may not be a complete list of foods and drinks to avoid. Contact your dietitian for more information.    This information is not intended to replace advice given to you by your health care provider. Make sure you discuss any questions you have with your health care provider. Document Released: 10/25/2011 Document Revised: 10/01/2015 Document Reviewed: 02/27/2013 Elsevier Interactive Patient Education  2017 Elsevier Inc.  

## 2016-06-13 NOTE — Progress Notes (Signed)
  Subjective:   Patient ID: [name], female    DOB: [birth year], 72 y.o.   MRN: [id number] CC: Follow-up multiple med problems HPI: [name] is a 71 y.o. female presenting for Follow-up  Has had rash that comes and goes for the past 3 weeks Lasts about half an hour Happens every day, happening all through the day Doesn't keep her awake Goes to sleep fine  Follows with pain doctor for lower back pain  HTN: was having HA at home not on lisinopril, restarted last week 113 lowest SBP at home No further headaches  Relevant past medical, surgical, family and social history reviewed. Allergies and medications reviewed and updated. History  Smoking Status  . Never Smoker  Smokeless Tobacco  . Never Used   ROS: Per HPI   Objective:    BP 138/81   Pulse 64   Temp 97.1 F (36.2 C) (Oral)   Ht 5' (1.524 m)   Wt 185 lb 6.4 oz (84.1 kg)   BMI 36.21 kg/m   Wt Readings from Last 3 Encounters:  06/13/16 185 lb 6.4 oz (84.1 kg)  06/08/16 185 lb (83.9 kg)  05/11/16 188 lb (85.3 kg)    Gen: NAD, alert, cooperative with exam, NCAT EYES: EOMI, no conjunctival injection, or no icterus CV: NRRR, normal S1/S2, no murmur, distal pulses 2+ b/l Resp: CTABL, no wheezes, normal WOB Abd: +BS, soft, NTND. no guarding or organomegaly Ext: No edema, warm Neuro: Alert and oriented, strength equal b/l UE and LE, coordination grossly normal MSK: normal muscle bulk  Assessment & Plan:  [name] was seen today for follow-up multiple med problems.  Diagnoses and all orders for this visit:  Essential hypertension Improved control, now on HCTZ and lisinopril  Other chronic pain Takes once a day at night Has s/e next day of grogginess if she takes more often -     gabapentin (NEURONTIN) 300 MG capsule; Take 1 capsule (300 mg total) by mouth at bedtime.  Peripheral polyneuropathy (HCC) Gabapentin as above Follows with pain medicine  Rash Comes and goes Taking benadryl  prn Rash doesn't stay longer than 30 min in any one place Likely getting exposed to something that she is reacting to Cont benadryl prn Take cetirizine in the morning Use below on rash for itching Let m eknow if not improving -     triamcinolone (KENALOG) 0.025 % ointment; Apply 1 application topically 2 (two) times daily.   Follow up plan: Return in about 3 months (around 09/10/2016). [name], MD [place]

## 2016-06-21 ENCOUNTER — Other Ambulatory Visit: Payer: Self-pay | Admitting: Pediatrics

## 2016-06-21 DIAGNOSIS — Z1231 Encounter for screening mammogram for malignant neoplasm of breast: Secondary | ICD-10-CM

## 2016-06-22 ENCOUNTER — Ambulatory Visit (INDEPENDENT_AMBULATORY_CARE_PROVIDER_SITE_OTHER): Payer: Medicare HMO | Admitting: Pediatrics

## 2016-06-22 ENCOUNTER — Encounter: Payer: Self-pay | Admitting: Pediatrics

## 2016-06-22 VITALS — BP 105/58 | HR 62 | Temp 97.7°F | Ht 60.0 in | Wt 186.0 lb

## 2016-06-22 DIAGNOSIS — I10 Essential (primary) hypertension: Secondary | ICD-10-CM

## 2016-06-22 DIAGNOSIS — L509 Urticaria, unspecified: Secondary | ICD-10-CM | POA: Diagnosis not present

## 2016-06-22 MED ORDER — CETIRIZINE HCL 10 MG PO TABS: 10.0000 mg | ORAL_TABLET | Freq: Every day | ORAL | 11 refills | Status: DC

## 2016-06-22 MED ORDER — LISINOPRIL-HYDROCHLOROTHIAZIDE 10-12.5 MG PO TABS: 1.0000 | ORAL_TABLET | Freq: Every day | ORAL | 3 refills | Status: DC

## 2016-06-22 NOTE — Progress Notes (Signed)
  Subjective:   Patient ID: [name], female    DOB: 1945-11-28, 71 y.o.   MRN: [id number] CC: Rash  HPI: [name] is a 71 y.o. female presenting for Rash  Continues to have rash that comes and goes Since getting into clinic has had new patch of rash on inside of L wrist, inside of L arm Says rash comes and goes throughout the day Triamcinolone helping slightly with itching Does not think any better, not any worse Benadryl sometimes helps with itching  Relevant past medical, surgical, family and social history reviewed. Allergies and medications reviewed and updated. History  Smoking Status  . Never Smoker  Smokeless Tobacco  . Never Used   ROS: Per HPI   Objective:    BP (!) 105/58   Pulse 62   Temp 97.7 F (36.5 C) (Oral)   Ht 5' (1.524 m)   Wt 186 lb (84.4 kg)   BMI 36.33 kg/m   Wt Readings from Last 3 Encounters:  06/22/16 186 lb (84.4 kg)  06/13/16 185 lb 6.4 oz (84.1 kg)  06/08/16 185 lb (83.9 kg)    Gen: NAD, cooperative with exam, NCAT EYES: EOMI, no conjunctival injection, or no icterus ENT: OP without erythema LYMPH: no cervical LAD CV: NRRR, normal S1/S2 Resp: CTABL, no wheezes, normal WOB Abd: +BS, soft, NTND. Ext: No edema, warm Neuro: Alert and oriented Skin: red patch inside of L wrist several cm wide, not well defined, second patch inside L elbow. Excoriations present lower legs, no toher red rash  Assessment & Plan:  [name] was seen today for rash.  Diagnoses and all orders for this visit:  Urticaria Urticaria ongoing 5 weeks No clear exposure as cause Antihistamine daily, benadryl prn -     Ambulatory referral to Allergy -     cetirizine (ZYRTEC) 10 MG tablet; Take 1 tablet (10 mg total) by mouth daily.  Essential hypertension Feels lightheaded at times Decrease lisinopril to 10mg  Cont to check at home -     lisinopril-hydrochlorothiazide (PRINZIDE,ZESTORETIC) 10-12.5 MG tablet; Take 1 tablet by mouth  daily.  Follow up plan: 3 mo [name], MD [place]

## 2016-06-27 ENCOUNTER — Other Ambulatory Visit: Payer: Self-pay | Admitting: Pediatrics

## 2016-06-27 ENCOUNTER — Telehealth: Payer: Self-pay | Admitting: Pediatrics

## 2016-06-27 DIAGNOSIS — G8929 Other chronic pain: Secondary | ICD-10-CM

## 2016-07-04 ENCOUNTER — Ambulatory Visit
Admission: RE | Admit: 2016-07-04 | Discharge: 2016-07-04 | Disposition: A | Discharge status: Home / Self Care | Payer: Medicare HMO | Source: Ambulatory Visit | Attending: Pediatrics | Admitting: Pediatrics

## 2016-07-04 DIAGNOSIS — Z1231 Encounter for screening mammogram for malignant neoplasm of breast: Secondary | ICD-10-CM | POA: Diagnosis not present

## 2016-07-08 ENCOUNTER — Telehealth: Payer: Self-pay | Admitting: Pediatrics

## 2016-07-12 ENCOUNTER — Ambulatory Visit (INDEPENDENT_AMBULATORY_CARE_PROVIDER_SITE_OTHER): Payer: Medicare HMO | Admitting: Family

## 2016-07-12 ENCOUNTER — Encounter: Payer: Self-pay | Admitting: Family

## 2016-07-12 VITALS — BP 117/70 | HR 81 | Temp 96.9°F | Ht 60.0 in | Wt 185.0 lb

## 2016-07-12 DIAGNOSIS — R21 Rash and other nonspecific skin eruption: Secondary | ICD-10-CM | POA: Diagnosis not present

## 2016-07-12 NOTE — Progress Notes (Signed)
   Subjective:    Patient ID: [name], female    DOB: 01-03-1946, 71 y.o.   MRN: [id number]  PT presents to the office today with a recurrent rash that started a few months ago. States she has a rash every day that comes and goes. Take zyrtec, benadryl cream, and kenalog cream with no relief. States it does itch. Pt has allergy appt 07/22/16. Has changed her detergent and lotions with no change.  Rash  This is a recurrent problem. The current episode started more than 1 month ago. The problem has been waxing and waning since onset. The affected locations include the left arm, right arm, abdomen and right hip. The rash is characterized by redness and itchiness. She was exposed to nothing. Pertinent negatives include no diarrhea, joint pain, shortness of breath, sore throat or vomiting. Past treatments include antihistamine. The treatment provided mild relief.     Review of Systems  HENT: Negative for sore throat.   Respiratory: Negative for shortness of breath.   Gastrointestinal: Negative for diarrhea and vomiting.  Musculoskeletal: Negative for joint pain.  Skin: Positive for rash.  All other systems reviewed and are negative.      Objective:   Physical Exam  Constitutional: She is oriented to person, place, and time. She appears well-developed and well-nourished. No distress.  HENT:  Head: Normocephalic and atraumatic.  Eyes: Pupils are equal, round, and reactive to light.  Neck: Normal range of motion. Neck supple. No thyromegaly present.  Cardiovascular: Normal rate, regular rhythm, normal heart sounds and intact distal pulses.   No murmur heard. Pulmonary/Chest: Effort normal and breath sounds normal. No respiratory distress. She has no wheezes.  Abdominal: Soft. Bowel sounds are normal. She exhibits no distension. There is no tenderness.  Musculoskeletal: Normal range of motion. She exhibits no edema or tenderness.  Neurological: She is alert and oriented to person,  place, and time. She has normal reflexes. No cranial nerve deficit.  Skin: Skin is warm and dry. There is erythema (blanchable erythemas rash).  Psychiatric: She has a normal mood and affect. Her behavior is normal. Judgment and thought content normal.  Vitals reviewed.    BP 117/70   Pulse 81   Temp (!) 96.9 F (36.1 C) (Oral)   Ht 5' (1.524 m)   Wt 185 lb (83.9 kg)   BMI 36.13 kg/m      Assessment & Plan:  1. Rash -Continue zyrtec -Try food journal to see if correlation to rash -Keep allergen specialists  -Will try to limit caffeine- will switch to decaffeinated coffee -RTO prn   Evelina Dun, FNP

## 2016-07-12 NOTE — Patient Instructions (Signed)
   Contact Dermatitis Dermatitis is redness, soreness, and swelling (inflammation) of the skin. Contact dermatitis is a reaction to certain substances that touch the skin. You either touched something that irritated your skin, or you have allergies to something you touched. Follow these instructions at home: Skin Care   Moisturize your skin as needed.  Apply cool compresses to the affected areas.  Try taking a bath with:  Epsom salts. Follow the instructions on the package. You can get these at a pharmacy or grocery store.  Baking soda. Pour a small amount into the bath as told by your doctor.  Colloidal oatmeal. Follow the instructions on the package. You can get this at a pharmacy or grocery store.  Try applying baking soda paste to your skin. Stir water into baking soda until it looks like paste.  Do not scratch your skin.  Bathe less often.  Bathe in lukewarm water. Avoid using hot water. Medicines   Take or apply over-the-counter and prescription medicines only as told by your doctor.  If you were prescribed an antibiotic medicine, take or apply your antibiotic as told by your doctor. Do not stop taking the antibiotic even if your condition starts to get better. General instructions   Keep all follow-up visits as told by your doctor. This is important.  Avoid the substance that caused your reaction. If you do not know what caused it, keep a journal to try to track what caused it. Write down:  What you eat.  What cosmetic products you use.  What you drink.  What you wear in the affected area. This includes jewelry.  If you were given a bandage (dressing), take care of it as told by your doctor. This includes when to change and remove it. Contact a doctor if:  You do not get better with treatment.  Your condition gets worse.  You have signs of infection such as:  Swelling.  Tenderness.  Redness.  Soreness.  Warmth.  You have a fever.  You have new  symptoms. Get help right away if:  You have a very bad headache.  You have neck pain.  Your neck is stiff.  You throw up (vomit).  You feel very sleepy.  You see red streaks coming from the affected area.  Your bone or joint underneath the affected area becomes painful after the skin has healed.  The affected area turns darker.  You have trouble breathing. This information is not intended to replace advice given to you by your health care provider. Make sure you discuss any questions you have with your health care provider. Document Released: 02/20/2009 Document Revised: 10/01/2015 Document Reviewed: 09/10/2014 Elsevier Interactive Patient Education  2017 [employer]  

## 2016-07-22 ENCOUNTER — Ambulatory Visit (INDEPENDENT_AMBULATORY_CARE_PROVIDER_SITE_OTHER): Payer: Medicare HMO | Admitting: Allergy

## 2016-07-22 ENCOUNTER — Encounter: Payer: Self-pay | Admitting: Allergy

## 2016-07-22 VITALS — BP 158/80 | HR 81 | Temp 97.9°F | Wt 190.4 lb

## 2016-07-22 DIAGNOSIS — L508 Other urticaria: Secondary | ICD-10-CM

## 2016-07-22 DIAGNOSIS — Z91018 Allergy to other foods: Secondary | ICD-10-CM

## 2016-07-22 LAB — CBC WITH DIFFERENTIAL/PLATELET
BASOS PCT: 0 %
Basophils Absolute: 0 cells/uL (ref 0–200)
EOS PCT: 3 %
Eosinophils Absolute: 264 cells/uL (ref 15–500)
HEMATOCRIT: 38.8 % (ref 35.0–45.0)
HEMOGLOBIN: 13.1 g/dL (ref 11.7–15.5)
LYMPHS ABS: 2904 {cells}/uL (ref 850–3900)
LYMPHS PCT: 33 %
MCH: 31.3 pg (ref 27.0–33.0)
MCHC: 33.8 g/dL (ref 32.0–36.0)
MCV: 92.6 fL (ref 80.0–100.0)
MONO ABS: 704 {cells}/uL (ref 200–950)
MPV: 9 fL (ref 7.5–12.5)
Monocytes Relative: 8 %
Neutro Abs: 4928 cells/uL (ref 1500–7800)
Neutrophils Relative %: 56 %
Platelets: 318 10*3/uL (ref 140–400)
RBC: 4.19 MIL/uL (ref 3.80–5.10)
RDW: 13.2 % (ref 11.0–15.0)
WBC: 8.8 10*3/uL (ref 3.8–10.8)

## 2016-07-22 NOTE — Patient Instructions (Signed)
Hives - at this time no identifiable trigger to your hives.  There are no concerning features at this time with your hives.  - will obtain following labs: CBC w diff, CMP, ESR, CRP, chronic hive panel, environmental panel - recommend taking Zyrtec 28m twice a day (in AM and PM) and Zantac 1568mtwice a day with Zyrtec - let usKoreanow if you have any swelling, fevers, or if rash leaves any marks or bruising  Food allergy - will obtain cashew IgE level - continue avoidance for now  Follow-up 3-4 months

## 2016-07-22 NOTE — Progress Notes (Signed)
New Patient Note  RE: [name] MRN: [id number] DOB: 07-Jun-1945 Date of Office Visit: 07/22/2016  Referring provider: [name], MD Primary care provider: [name], MD  Chief Complaint: rash   History of present illness: [name] is a 71 y.o. female presenting today for consultation for rash.  She has been getting red itchy rash that appears in different spots all over her body.  It is very itchy.  She reports the areas with the rash will be warm to touch.  Symptoms have been ongoing for past 6 weeks.  The rash occurs daily.  She has noticed that warm/hot temps like showers makes it flare.  She has tried cortisone cream as well Zyrtec 1 tab a daily which she reports has not help.  Denies any preceding illnesses, no foods, no medications (besides percocet), no stings, no change in soaps/lotions/detergents.  No associated swelling.  No joint aches/pain, no fevers.  She changed her pain medications in November from vicodin to percocet.  She takes percocet daily and she does not see any flare with use.   She has seen her PCP on 2 different occasions since February for rash.  It has been recommended that she take a daily antihistamine and use Benadryl as needed.  She reports she has a cashew allergy where she develops "water blisters" with ingestion on 2 separate occasions back in the 90s.  She avoids cashews allergy. She is able to each other tree nuts and peanuts without issue.    Denies any history of asthma, eczema and no nasal or ocular symptoms suggestive of allergic rhinoconjunctivitis.   Review of systems: Review of Systems  Constitutional: Negative for chills, fever and malaise/fatigue.  HENT: Negative for congestion, ear discharge, ear pain, nosebleeds, sinus pain, sore throat and tinnitus.   Eyes: Negative for discharge and redness.  Respiratory: Negative for cough, shortness of breath and wheezing.   Cardiovascular: Negative for chest pain.    Gastrointestinal: Negative for abdominal pain, heartburn, nausea and vomiting.  Musculoskeletal: Negative for joint pain and myalgias.  Skin: Positive for itching and rash.  Neurological: Negative for headaches.    All other systems negative unless noted above in HPI  Past medical history: Past Medical History:  Diagnosis Date  . Arthritis   . Colon polyp, hyperplastic 04/27/2012   Colonoscopy 2010.  No adenomatous polyps   . Endometrioid adenocarcinoma   . History of endometrial cancer s/p TAH BSO June2013 06/15/2011   Grade 1 endometrioid adenocarcinoma with focal superficial myometrial invasion of 0.1 cm with a myometrium of 1.8 cm or approximately 7%. She had a 5-cm tumor. No lymphovascular space Involvement.    . Hyperlipidemia   . Hypertension   . Mass of in fold of jejunum s/p SB resection 07/19/2012 07/20/2012  . Obesity (BMI 30-39.9) 04/27/2012  . Pyelonephritis 02/2016  . 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 SBO (small bowel obstruction) - recurrent 04/27/2012  . Ureteral adhesion, left 2010   s/p ureterolysis & stenting Dr. Larwance Sachs now  . UTI (urinary tract infection)     Past surgical history: Past Surgical History:  Procedure Laterality Date  . ABDOMINAL HYSTERECTOMY  11/02/10   TAH, BSO, lysis of adhesions for endometrial cancer  . [place], 2009   anterior L2-3, L3-4 arthrodesis  . BACK SURGERY  12/17/2012   Spinal fusion and repair   . BLADDER SUSPENSION  2003  .  CATARACT EXTRACTION, BILATERAL  2003  . EYE SURGERY    . FOOT SURGERY  06/2010   left foot surgery  . HERNIA REPAIR  07/19/12   lap [place] repair  . INCISIONAL HERNIA REPAIR  11/02/10  . INSERTION OF MESH N/A 04/23/2013   Procedure: INSERTION OF MESH;  Surgeon: Adin Hector, MD;  Location: [facility];  Service: General;  Laterality: N/A;  . JOINT REPLACEMENT  09/2009   Right total knee  . KIDNEY SURGERY  2010   Left kidney   . LAPAROSCOPIC LYSIS OF ADHESIONS N/A 04/23/2013   Procedure: LAPAROSCOPIC LYSIS OF ADHESIONS;  Surgeon: Adin Hector, MD;  Location: [facility];  Service: General;  Laterality: N/A;  . TOTAL HIP ARTHROPLASTY  01/2010   Right total hip  . TOTAL HIP ARTHROPLASTY Left 07/04/2014   Procedure: LEFT TOTAL HIP ARTHROPLASTY ANTERIOR APPROACH;  Surgeon: Gearlean Alf, MD;  Location: [facility];  Service: Orthopedics;  Laterality: Left;  . TOTAL KNEE ARTHROPLASTY     right  . URETEROLYSIS  2010   lap/open left ureterolysis & omental flap, ureter stent  . VENTRAL HERNIA REPAIR N/A 07/19/2012   Procedure: LAPAROSCOPIC LYSIS OF ADHESIONS, SMALL BOWEL RESECTION, SEROSAL REPAIR, PRIMARY VENTRAL HERNIA REPAIR;  Surgeon: Adin Hector, MD;  Location: [facility];  Service: General;  Laterality: N/A;  . VENTRAL HERNIA REPAIR N/A 04/23/2013   Procedure: LAPAROSCOPIC exploration and repair of hernia in abdominal VENTRAL wall  HERNIA;  Surgeon: Adin Hector, MD;  Location: [facility];  Service: General;  Laterality: N/A;    Family history:  Family History  Problem Relation Age of Onset  . Diabetes Mother   . Cancer Mother     lung  . Diabetes Father   . Cancer Father     liver  . Colon cancer Sister   . Diabetes Sister   . Cancer Sister     colon  . Breast cancer Neg Hx   . Allergic rhinitis Neg Hx   . Angioedema Neg Hx   . Asthma Neg Hx   . Atopy Neg Hx   . Eczema Neg Hx   . Immunodeficiency Neg Hx   . Urticaria Neg Hx     Social history: She lives in a home with carpeting in the bedroom with oil heating and central cooling. There is a dog in the home. There is no concern for water damage, mildew or roaches in the home. She does not currently work. She has no tobacco use history.  Medication List: Allergies as of 07/22/2016      Reactions   Cashew Nut Oil Hives   Sulfamethoxazole    Mouth sores   Zofran Hives, Nausea Only      Medication List       Accurate as of 07/22/16  2:12 PM. Always use  your most recent med list.          amitriptyline 25 MG tablet Commonly known as:  ELAVIL TAKE ONE TABLET BY MOUTH ONCE DAILY AT BEDTIME   cetirizine 10 MG tablet Commonly known as:  ZYRTEC Take 1 tablet (10 mg total) by mouth daily.   gabapentin 300 MG capsule Commonly known as:  NEURONTIN Take 1 capsule (300 mg total) by mouth at bedtime.   lisinopril-hydrochlorothiazide 10-12.5 MG tablet Commonly known as:  PRINZIDE,ZESTORETIC Take 1 tablet by mouth daily.   meloxicam 15 MG tablet Commonly known as:  MOBIC TAKE 1 TABLET DAILY   oxyCODONE-acetaminophen 5-325 MG tablet Commonly  known as:  PERCOCET/ROXICET Take by mouth every 6 (six) hours as needed for severe pain.   sodium phosphate 7-19 GM/118ML Enem Place 133 mLs (1 enema total) rectally daily as needed for severe constipation.   triamcinolone 0.025 % ointment Commonly known as:  KENALOG Apply 1 application topically 2 (two) times daily.       Known medication allergies: Allergies  Allergen Reactions  . Cashew Nut Oil Hives  . Sulfamethoxazole     Mouth sores  . Zofran Hives and Nausea Only     Physical examination: Blood pressure (!) 158/80, pulse 81, temperature 97.9 F (36.6 C), temperature source Oral, weight 190 lb 6.4 oz (86.4 kg), SpO2 95 %.  General: Alert, interactive, in no acute distress. HEENT: Rt pupil without change with light, aniscoric; TMs pearly gray, turbinates minimally edematous without discharge, post-pharynx non erythematous. Neck: Supple without lymphadenopathy. Lungs: Clear to auscultation without wheezing, rhonchi or rales. {no increased work of breathing. CV: Normal S1, S2 without murmurs. Abdomen: Nondistended, nontender. Skin: Scattered erythematous urticarial type lesions primarily located arms b/l , nonvesicular. Extremities:  No clubbing, cyanosis or edema. Neuro:   Grossly intact.  Diagnositics/Labs:  Allergy testing: deferred due to ongoing rash   Assessment and  plan: Urticaria without angioedema - at this time no identifiable trigger to your hives.  There are no concerning features at this time with your hives.  - will obtain following labs: CBC w diff, CMP, ESR, CRP, chronic hive panel, environmental panel - recommend taking Zyrtec 62m twice a day (in AM and PM) and Zantac 1556mtwice a day with Zyrtec - let usKoreanow if you have any swelling, fevers, or if rash leaves any marks or bruising  Food allergy - will obtain cashew IgE level - continue avoidance for now  Follow-up 3-4 months   I appreciate the opportunity to take part in [place]. Please do not hesitate to contact me with questions.  Sincerely,   ShPrudy FeelerMD Allergy/[facility]ology [facility] [place]

## 2016-07-23 LAB — COMPREHENSIVE METABOLIC PANEL
ALK PHOS: 67 U/L (ref 33–130)
ALT: 11 U/L (ref 6–29)
AST: 13 U/L (ref 10–35)
Albumin: 3.8 g/dL (ref 3.6–5.1)
BILIRUBIN TOTAL: 0.4 mg/dL (ref 0.2–1.2)
BUN: 14 mg/dL (ref 7–25)
CALCIUM: 9.1 mg/dL (ref 8.6–10.4)
CO2: 27 mmol/L (ref 20–31)
CREATININE: 0.79 mg/dL (ref 0.60–0.93)
Chloride: 104 mmol/L (ref 98–110)
GLUCOSE: 94 mg/dL (ref 65–99)
Potassium: 4.2 mmol/L (ref 3.5–5.3)
SODIUM: 140 mmol/L (ref 135–146)
Total Protein: 6.6 g/dL (ref 6.1–8.1)

## 2016-07-23 LAB — SEDIMENTATION RATE: SED RATE: 17 mm/h (ref 0–30)

## 2016-07-25 ENCOUNTER — Telehealth: Payer: Self-pay | Admitting: Allergy

## 2016-07-25 DIAGNOSIS — L509 Urticaria, unspecified: Secondary | ICD-10-CM

## 2016-07-25 LAB — CP584 ZONE 3
Allergen, Black Locust, Acacia9: <0.1 kU/L
Allergen, Comm Silver Birch, t9: <0.1 kU/L
Allergen, D pternoyssinus,d7: <0.1 kU/L
Allergen, Oak,t7: <0.1 kU/L
Allergen, P. notatum, m1: <0.1 kU/L
Bahia Grass: <0.1 kU/L
Bermuda Grass: <0.1 kU/L
Box Elder IgE: <0.1 kU/L
Cat Dander: <0.1 kU/L
D. farinae: <0.1 kU/L
Dog Dander: <0.1 kU/L
Nettle: <0.1 kU/L
Plantain: <0.1 kU/L

## 2016-07-25 LAB — C-REACTIVE PROTEIN: CRP: 4.4 mg/L (ref <8.0)

## 2016-07-25 LAB — ALLERGEN CASHEW

## 2016-07-25 MED ORDER — CETIRIZINE HCL 10 MG PO TABS: 10.0000 mg | ORAL_TABLET | Freq: Two times a day (BID) | ORAL | 6 refills | Status: DC

## 2016-07-25 MED ORDER — RANITIDINE HCL 150 MG PO TABS: 150.0000 mg | ORAL_TABLET | Freq: Two times a day (BID) | ORAL | 3 refills | Status: DC

## 2016-07-25 NOTE — Telephone Encounter (Signed)
Scripts sent into pharmacy. 

## 2016-07-25 NOTE — Telephone Encounter (Signed)
Pt called and said that her rx were not called into [facility]  In [place] the Zyrtec,Zantac. [id number].

## 2016-07-26 DIAGNOSIS — M461 Sacroiliitis, not elsewhere classified: Secondary | ICD-10-CM | POA: Diagnosis not present

## 2016-07-26 DIAGNOSIS — K08109 Complete loss of teeth, unspecified cause, unspecified class: Secondary | ICD-10-CM | POA: Diagnosis not present

## 2016-07-26 DIAGNOSIS — K219 Gastro-esophageal reflux disease without esophagitis: Secondary | ICD-10-CM | POA: Diagnosis not present

## 2016-07-26 DIAGNOSIS — Z0000 Encounter for general adult medical examination without abnormal findings: Secondary | ICD-10-CM | POA: Diagnosis not present

## 2016-07-26 DIAGNOSIS — G3184 Mild cognitive impairment, so stated: Secondary | ICD-10-CM | POA: Diagnosis not present

## 2016-07-26 DIAGNOSIS — I10 Essential (primary) hypertension: Secondary | ICD-10-CM | POA: Diagnosis not present

## 2016-07-26 DIAGNOSIS — M159 Polyosteoarthritis, unspecified: Secondary | ICD-10-CM | POA: Diagnosis not present

## 2016-07-26 DIAGNOSIS — E669 Obesity, unspecified: Secondary | ICD-10-CM | POA: Diagnosis not present

## 2016-07-26 DIAGNOSIS — Z972 Presence of dental prosthetic device (complete) (partial): Secondary | ICD-10-CM | POA: Diagnosis not present

## 2016-07-26 DIAGNOSIS — Z79899 Other long term (current) drug therapy: Secondary | ICD-10-CM | POA: Diagnosis not present

## 2016-07-26 DIAGNOSIS — G4700 Insomnia, unspecified: Secondary | ICD-10-CM | POA: Diagnosis not present

## 2016-07-26 DIAGNOSIS — Z6836 Body mass index (BMI) 36.0-36.9, adult: Secondary | ICD-10-CM | POA: Diagnosis not present

## 2016-07-26 DIAGNOSIS — R69 Illness, unspecified: Secondary | ICD-10-CM | POA: Diagnosis not present

## 2016-07-26 DIAGNOSIS — Z791 Long term (current) use of non-steroidal anti-inflammatories (NSAID): Secondary | ICD-10-CM | POA: Diagnosis not present

## 2016-07-26 LAB — TRYPTASE: Tryptase: 9.3 ug/L (ref <11)

## 2016-07-27 LAB — CP CHRONIC URTICARIA INDEX PANEL
Histamine Release: <16 % (ref <16)
TSH: 1.06 mIU/L
Thyroperoxidase Ab SerPl-aCnc: <1 IU/mL (ref <9)

## 2016-08-11 DIAGNOSIS — R69 Illness, unspecified: Secondary | ICD-10-CM | POA: Diagnosis not present

## 2016-08-11 DIAGNOSIS — Z79899 Other long term (current) drug therapy: Secondary | ICD-10-CM | POA: Diagnosis not present

## 2016-08-11 DIAGNOSIS — M461 Sacroiliitis, not elsewhere classified: Secondary | ICD-10-CM | POA: Diagnosis not present

## 2016-08-11 DIAGNOSIS — M7071 Other bursitis of hip, right hip: Secondary | ICD-10-CM | POA: Diagnosis not present

## 2016-08-11 DIAGNOSIS — M961 Postlaminectomy syndrome, not elsewhere classified: Secondary | ICD-10-CM | POA: Diagnosis not present

## 2016-08-24 DIAGNOSIS — M47818 Spondylosis without myelopathy or radiculopathy, sacral and sacrococcygeal region: Secondary | ICD-10-CM | POA: Diagnosis not present

## 2016-09-02 ENCOUNTER — Ambulatory Visit: Payer: Medicare HMO | Admitting: Allergy

## 2016-09-06 ENCOUNTER — Ambulatory Visit (INDEPENDENT_AMBULATORY_CARE_PROVIDER_SITE_OTHER): Payer: Medicare HMO | Admitting: Family Medicine

## 2016-09-06 ENCOUNTER — Encounter: Payer: Self-pay | Admitting: Family Medicine

## 2016-09-06 VITALS — BP 125/72 | HR 67 | Temp 96.9°F | Ht 60.0 in | Wt 184.0 lb

## 2016-09-06 DIAGNOSIS — R399 Unspecified symptoms and signs involving the genitourinary system: Secondary | ICD-10-CM

## 2016-09-06 LAB — URINALYSIS, COMPLETE
BILIRUBIN UA: NEGATIVE
Glucose, UA: NEGATIVE
Ketones, UA: NEGATIVE
Leukocytes, UA: NEGATIVE
Nitrite, UA: NEGATIVE
Protein, UA: NEGATIVE
RBC UA: NEGATIVE
Specific Gravity, UA: 1.01 (ref 1.005–1.030)
UUROB: 0.2 mg/dL (ref 0.2–1.0)
pH, UA: 7 (ref 5.0–7.5)

## 2016-09-06 LAB — MICROSCOPIC EXAMINATION
Bacteria, UA: NONE SEEN
RBC, UA: NONE SEEN /hpf (ref 0–?)

## 2016-09-06 MED ORDER — CIPROFLOXACIN HCL 500 MG PO TABS: 500.0000 mg | ORAL_TABLET | Freq: Two times a day (BID) | ORAL | 0 refills | Status: DC

## 2016-09-06 MED ORDER — CEFTRIAXONE SODIUM 1 G IJ SOLR
1.0000 g | Freq: Once | INTRAMUSCULAR | Status: AC
  Administered 2016-09-06: 1 g via INTRAMUSCULAR

## 2016-09-06 NOTE — Progress Notes (Signed)
Subjective:  Patient ID: [name], female    DOB: [birth year]  Age: 71 y.o. MRN: [id number]  CC: Dysuria (pt here today c/o dysuria x 4 days and sinus problem.)   HPI [name] presents for burning with urination and frequency for About a week. Denies fever . Mild left flank pain. No nausea, vomiting.  Patient has a history of left kidney damage during his surgery with a follow-up surgery back in 2010/2011. Since then she gets 2-3 infections per year. She was hospitalized in October and again in December due to urinary tract related syndromes. She is concerned about prevention of that today.Hospital records reviewed showing need for IV antibiotics as well as a urine culture showing Escherichia coli. History [name] has a past medical history of Arthritis; Colon polyp, hyperplastic (04/27/2012); Endometrioid adenocarcinoma; History of endometrial cancer s/p TAH BSO [id number] (06/15/2011); Hyperlipidemia; Hypertension; Mass of in fold of jejunum s/p SB resection 07/19/2012 (07/20/2012); Obesity (BMI 30-39.9) (04/27/2012); Pyelonephritis (02/2016); Retroperitoneal fibrosis in setting of chronic abscess at ureter 2010 (06/20/2012); SBO (small bowel obstruction) - recurrent (04/27/2012); Ureteral adhesion, left (2010); and UTI (urinary tract infection).   She has a past surgical history that includes Abdominal hysterectomy (11/02/10); Incisional hernia repair (11/02/10); Back surgery (1989, 1999, 2009); Kidney surgery (2010); Bladder suspension (2003); Foot surgery (06/2010); Joint replacement (09/2009); Total hip arthroplasty (01/2010); Cataract extraction, bilateral (2003); Total knee arthroplasty; Ureterolysis (2010); Ventral hernia repair (N/A, 07/19/2012); Hernia repair (07/19/12); Eye surgery; Back surgery (12/17/2012); Ventral hernia repair (N/A, 04/23/2013); Insertion of mesh (N/A, 04/23/2013); Laparoscopic lysis of adhesions (N/A, 04/23/2013); and Total hip arthroplasty (Left, 07/04/2014).    Her family history includes Cancer in her father, mother, and sister; Colon cancer in her sister; Diabetes in her father, mother, and sister.She reports that she has never smoked. She has never used smokeless tobacco. She reports that she does not drink alcohol or use drugs.  Current Outpatient Prescriptions on File Prior to Visit  Medication Sig Dispense Refill  . amitriptyline (ELAVIL) 25 MG tablet TAKE ONE TABLET BY MOUTH ONCE DAILY AT BEDTIME 90 tablet 0  . cetirizine (ZYRTEC) 10 MG tablet Take 1 tablet (10 mg total) by mouth 2 (two) times daily. 60 tablet 6  . gabapentin (NEURONTIN) 300 MG capsule Take 1 capsule (300 mg total) by mouth at bedtime. 90 capsule 3  . lisinopril-hydrochlorothiazide (PRINZIDE,ZESTORETIC) 10-12.5 MG tablet Take 1 tablet by mouth daily. 90 tablet 3  . meloxicam (MOBIC) 15 MG tablet TAKE 1 TABLET DAILY 90 tablet 0  . oxyCODONE-acetaminophen (PERCOCET/ROXICET) 5-325 MG tablet Take by mouth every 6 (six) hours as needed for severe pain.    . ranitidine (ZANTAC) 150 MG tablet Take 1 tablet (150 mg total) by mouth 2 (two) times daily. 60 tablet 3  . sodium phosphate (FLEET) 7-19 GM/118ML ENEM Place 133 mLs (1 enema total) rectally daily as needed for severe constipation. 230 mL 1  . triamcinolone (KENALOG) 0.025 % ointment Apply 1 application topically 2 (two) times daily. 30 g 0   No current facility-administered medications on file prior to visit.     ROS Review of Systems  Constitutional: Negative for chills, diaphoresis and fever.  HENT: Negative for congestion.   Eyes: Negative for visual disturbance.  Respiratory: Negative for cough and shortness of breath.   Cardiovascular: Negative for chest pain and palpitations.  Gastrointestinal: Negative for constipation, diarrhea and nausea.  Genitourinary: Positive for dysuria, frequency and urgency. Negative for decreased urine volume, flank pain, hematuria, menstrual  problem and pelvic pain.  Musculoskeletal:  Negative for arthralgias and joint swelling.  Skin: Negative for rash.  Neurological: Negative for dizziness and numbness.    Objective:  BP 125/72   Pulse 67   Temp (!) 96.9 F (36.1 C) (Oral)   Ht 5' (1.524 m)   Wt 184 lb (83.5 kg)   BMI 35.94 kg/m   Physical Exam  Constitutional: She is oriented to person, place, and time. She appears well-developed and well-nourished.  HENT:  Head: Normocephalic and atraumatic.  Cardiovascular: Normal rate and regular rhythm.   No murmur heard. Pulmonary/Chest: Effort normal and breath sounds normal.  Abdominal: Soft. Bowel sounds are normal. She exhibits no mass. There is no tenderness. There is no rebound and no guarding.  Musculoskeletal: She exhibits no tenderness.  Neurological: She is alert and oriented to person, place, and time.  Skin: Skin is warm and dry.  Psychiatric: She has a normal mood and affect. Her behavior is normal.    Assessment & Plan:   Karron was seen today for dysuria.  Diagnoses and all orders for this visit:  UTI symptoms -     Urinalysis, Complete -     cefTRIAXone (ROCEPHIN) injection 1 g; Inject 1 g into the muscle once. -     Urine culture  Other orders -     ciprofloxacin (CIPRO) 500 MG tablet; Take 1 tablet (500 mg total) by mouth 2 (two) times daily.   I am having Ms. Sebald start on ciprofloxacin. I am also having her maintain her meloxicam, oxyCODONE-acetaminophen, sodium phosphate, gabapentin, triamcinolone, lisinopril-hydrochlorothiazide, amitriptyline, ranitidine, and cetirizine. We will continue to administer cefTRIAXone.  Meds ordered this encounter  Medications  . ciprofloxacin (CIPRO) 500 MG tablet    Sig: Take 1 tablet (500 mg total) by mouth 2 (two) times daily.    Dispense:  14 tablet    Refill:  0  . cefTRIAXone (ROCEPHIN) injection 1 g   Talk to the dramatically severe symptoms the patient has had recently from bladder infections today we will go with a higher dose of Cipro  and with a Rocephin injection induction for treatment. Culture ordered. Follow-up: No Follow-up on file.  [name], M.D.

## 2016-09-08 LAB — URINE CULTURE

## 2016-09-12 ENCOUNTER — Ambulatory Visit: Payer: Medicare HMO | Admitting: Pediatrics

## 2016-09-13 ENCOUNTER — Ambulatory Visit (INDEPENDENT_AMBULATORY_CARE_PROVIDER_SITE_OTHER): Payer: Medicare HMO | Admitting: Pediatrics

## 2016-09-13 ENCOUNTER — Encounter: Payer: Self-pay | Admitting: Pediatrics

## 2016-09-13 VITALS — BP 112/70 | HR 58 | Temp 97.3°F | Ht 60.0 in | Wt 183.6 lb

## 2016-09-13 DIAGNOSIS — G8929 Other chronic pain: Secondary | ICD-10-CM | POA: Diagnosis not present

## 2016-09-13 DIAGNOSIS — G4700 Insomnia, unspecified: Secondary | ICD-10-CM

## 2016-09-13 DIAGNOSIS — I10 Essential (primary) hypertension: Secondary | ICD-10-CM

## 2016-09-13 MED ORDER — LISINOPRIL-HYDROCHLOROTHIAZIDE 10-12.5 MG PO TABS: 1.0000 | ORAL_TABLET | Freq: Every day | ORAL | 3 refills | Status: DC

## 2016-09-13 MED ORDER — AMITRIPTYLINE HCL 25 MG PO TABS: 25.0000 mg | ORAL_TABLET | Freq: Every day | ORAL | 1 refills | Status: DC

## 2016-09-13 MED ORDER — GABAPENTIN 300 MG PO CAPS: 300.0000 mg | ORAL_CAPSULE | Freq: Every day | ORAL | 3 refills | Status: DC

## 2016-09-13 NOTE — Progress Notes (Signed)
  Subjective:   Patient ID: [name], female    DOB: 02-Feb-1946, 71 y.o.   MRN: [id number] CC: Follow-up (3 month)  HPI: [name] is a 71 y.o. female presenting for Follow-up (3 month)  Insomnia: taking amitriptyline at night Does fine getting up to the bathroom if she has to, no lightheadedness No falls Has been taking gabapentin for LE chronic pain, just taking at night  Stopped meloxicam for procedure to help with L hip pain with ortho next week  HTN: no CP, no SOB Limited activity due to hip pain b/l Says she does ok getting   No further UTI symptoms, treated last week  Relevant past medical, surgical, family and social history reviewed. Allergies and medications reviewed and updated. History  Smoking Status  . Never Smoker  Smokeless Tobacco  . Never Used   ROS: Per HPI   Objective:    BP 112/70   Pulse (!) 58   Temp 97.3 F (36.3 C) (Oral)   Ht 5' (1.524 m)   Wt 183 lb 9.6 oz (83.3 kg)   BMI 35.86 kg/m   Wt Readings from Last 3 Encounters:  09/13/16 183 lb 9.6 oz (83.3 kg)  09/06/16 184 lb (83.5 kg)  07/22/16 190 lb 6.4 oz (86.4 kg)    Gen: NAD, alert, cooperative with exam, NCAT EYES: EOMI, no conjunctival injection, or no icterus ENT: OP without erythema CV: NRRR, normal S1/S2, no murmur Resp: CTABL, no wheezes, normal WOB Abd: +BS, soft, NTND. Ext: No edema, warm Neuro: Alert and oriented  Assessment & Plan:  [name] was seen today for follow-up multiple med problems  Diagnoses and all orders for this visit:  Insomnia, unspecified type Adequately controlled with amitriptyline Will cont  Essential hypertension Well controlled, cont current meds -     lisinopril-hydrochlorothiazide (PRINZIDE,ZESTORETIC) 10-12.5 MG tablet; Take 1 tablet by mouth daily.  Other chronic pain Stable, cont below -     gabapentin (NEURONTIN) 300 MG capsule; Take 1 capsule (300 mg total) by mouth at bedtime.  Follow up plan: 3 mo [name],  MD [place]

## 2016-09-20 DIAGNOSIS — M47818 Spondylosis without myelopathy or radiculopathy, sacral and sacrococcygeal region: Secondary | ICD-10-CM | POA: Diagnosis not present

## 2016-10-12 ENCOUNTER — Ambulatory Visit: Payer: Medicare HMO | Admitting: Allergy

## 2016-11-01 DIAGNOSIS — M7071 Other bursitis of hip, right hip: Secondary | ICD-10-CM | POA: Diagnosis not present

## 2016-11-01 DIAGNOSIS — M961 Postlaminectomy syndrome, not elsewhere classified: Secondary | ICD-10-CM | POA: Diagnosis not present

## 2016-11-01 DIAGNOSIS — Z79899 Other long term (current) drug therapy: Secondary | ICD-10-CM | POA: Diagnosis not present

## 2016-11-01 DIAGNOSIS — M47816 Spondylosis without myelopathy or radiculopathy, lumbar region: Secondary | ICD-10-CM | POA: Diagnosis not present

## 2016-11-01 DIAGNOSIS — Z6835 Body mass index (BMI) 35.0-35.9, adult: Secondary | ICD-10-CM | POA: Diagnosis not present

## 2016-11-01 DIAGNOSIS — I10 Essential (primary) hypertension: Secondary | ICD-10-CM | POA: Diagnosis not present

## 2016-11-01 DIAGNOSIS — R69 Illness, unspecified: Secondary | ICD-10-CM | POA: Diagnosis not present

## 2016-11-01 DIAGNOSIS — M47818 Spondylosis without myelopathy or radiculopathy, sacral and sacrococcygeal region: Secondary | ICD-10-CM | POA: Diagnosis not present

## 2016-11-16 DIAGNOSIS — Z6835 Body mass index (BMI) 35.0-35.9, adult: Secondary | ICD-10-CM | POA: Diagnosis not present

## 2016-11-16 DIAGNOSIS — I10 Essential (primary) hypertension: Secondary | ICD-10-CM | POA: Diagnosis not present

## 2016-11-16 DIAGNOSIS — M47816 Spondylosis without myelopathy or radiculopathy, lumbar region: Secondary | ICD-10-CM | POA: Diagnosis not present

## 2016-11-18 DIAGNOSIS — M7071 Other bursitis of hip, right hip: Secondary | ICD-10-CM | POA: Diagnosis not present

## 2016-11-18 DIAGNOSIS — M47816 Spondylosis without myelopathy or radiculopathy, lumbar region: Secondary | ICD-10-CM | POA: Diagnosis not present

## 2016-11-21 ENCOUNTER — Ambulatory Visit (INDEPENDENT_AMBULATORY_CARE_PROVIDER_SITE_OTHER): Payer: Medicare HMO | Admitting: Allergy

## 2016-11-21 ENCOUNTER — Encounter: Payer: Self-pay | Admitting: Allergy

## 2016-11-21 VITALS — BP 116/74 | HR 52 | Temp 98.3°F | Resp 18

## 2016-11-21 DIAGNOSIS — J3089 Other allergic rhinitis: Secondary | ICD-10-CM | POA: Diagnosis not present

## 2016-11-21 DIAGNOSIS — L508 Other urticaria: Secondary | ICD-10-CM | POA: Diagnosis not present

## 2016-11-21 DIAGNOSIS — Z91018 Allergy to other foods: Secondary | ICD-10-CM | POA: Diagnosis not present

## 2016-11-21 MED ORDER — EPINEPHRINE 0.3 MG/0.3ML IJ SOAJ: 0.3000 mg | Freq: Once | INTRAMUSCULAR | 2 refills | Status: AC

## 2016-11-21 NOTE — Progress Notes (Signed)
Follow-up Note  RE: [name] MRN: [id number] DOB: [birth year] Date of Office Visit: 11/21/2016   History of present illness: [name] is a 71 y.o. female presenting today for follow-up of cashew allergy and urticaria.  She was last seen in the office on 07/22/16 by myself.   She has done well since her last visit in regards to her urticaria.  She was taking zyrtec and zantac twice a day but reports she was able to stop this as her hives resolved.  She states she has been hive free for past 2 weeks.  She does endorse having nasal congestion and sinus headaches that she does not feel the zyrtec was helpful.  She does not use any nasal sprays at this time.   She continues strict avoidance of cashews.  She presents today for skin testing and has been off antihistamines.    Review of systems: Review of Systems  Constitutional: Negative for chills, fever and malaise/fatigue.  HENT: Positive for congestion. Negative for ear discharge, ear pain, nosebleeds, sinus pain, sore throat and tinnitus.   Eyes: Negative for discharge and redness.  Respiratory: Negative for cough, shortness of breath and wheezing.   Cardiovascular: Negative for chest pain.  Gastrointestinal: Negative for abdominal pain, constipation, diarrhea, heartburn, nausea and vomiting.  Musculoskeletal: Negative for joint pain.  Skin: Negative for itching and rash.  Neurological: Positive for headaches.    All other systems negative unless noted above in HPI  Past medical/social/surgical/family history have been reviewed and are unchanged unless specifically indicated below.  No changes  Medication List: Allergies as of 11/21/2016      Reactions   Cashew Nut Oil Hives   Sulfamethoxazole    Mouth sores   Zofran Hives, Nausea Only      Medication List       Accurate as of 11/21/16 10:14 AM. Always use your most recent med list.          amitriptyline 25 MG tablet Commonly known as:  ELAVIL Take 1  tablet (25 mg total) by mouth at bedtime.   cetirizine 10 MG tablet Commonly known as:  ZYRTEC Take 1 tablet (10 mg total) by mouth 2 (two) times daily.   gabapentin 300 MG capsule Commonly known as:  NEURONTIN Take 1 capsule (300 mg total) by mouth at bedtime.   lisinopril-hydrochlorothiazide 10-12.5 MG tablet Commonly known as:  PRINZIDE,ZESTORETIC Take 1 tablet by mouth daily.   meloxicam 15 MG tablet Commonly known as:  MOBIC TAKE 1 TABLET DAILY   oxyCODONE-acetaminophen 5-325 MG tablet Commonly known as:  PERCOCET/ROXICET Take by mouth every 6 (six) hours as needed for severe pain.   ranitidine 150 MG tablet Commonly known as:  ZANTAC Take 1 tablet (150 mg total) by mouth 2 (two) times daily.   sodium phosphate 7-19 GM/118ML Enem Place 133 mLs (1 enema total) rectally daily as needed for severe constipation.       Known medication allergies: Allergies  Allergen Reactions  . Cashew Nut Oil Hives  . Sulfamethoxazole     Mouth sores  . Zofran Hives and Nausea Only     Physical examination: Blood pressure 116/74, pulse (!) 52, temperature 98.3 F (36.8 C), resp. rate 18, SpO2 95 %.  General: Alert, interactive, in no acute distress. HEENT: PERRLA, TMs pearly gray, turbinates moderately edematous without discharge, post-pharynx unremarkable. Neck: Supple without lymphadenopathy. Lungs: Clear to auscultation without wheezing, rhonchi or rales. {no increased work of breathing. CV: Normal S1,  S2 without murmurs. Abdomen: Nondistended, nontender. Skin: Warm and dry, without lesions or rashes. Extremities:  No clubbing, cyanosis or edema. Neuro:   Grossly intact.  Diagnositics/Labs: Labs:  Component     Latest Ref Rng & Units 07/22/2016  WBC     3.8 - 10.8 K/uL 8.8  RBC     3.80 - 5.10 MIL/uL 4.19  Hemoglobin     11.7 - 15.5 g/dL 13.1  HCT     35.0 - 45.0 % 38.8  MCV     80.0 - 100.0 fL 92.6  MCH     27.0 - 33.0 pg 31.3  MCHC     32.0 - 36.0 g/dL  33.8  RDW     11.0 - 15.0 % 13.2  Platelets     140 - 400 K/uL 318  MPV     7.5 - 12.5 fL 9.0  NEUT#     1,500 - 7,800 cells/uL 4,928  Lymphocyte #     850 - 3,900 cells/uL 2,904  Monocyte #     200 - 950 cells/uL 704  Eosinophils Absolute     15 - 500 cells/uL 264  Basophils Absolute     0 - 200 cells/uL 0  Neutrophils     % 56  Lymphocytes     % 33  Monocytes Relative     % 8  Eosinophil     % 3  Basophil     % 0  Smear Review      Criteria for review not met  Sodium     135 - 146 mmol/L 140  Potassium     3.5 - 5.3 mmol/L 4.2  Chloride     98 - 110 mmol/L 104  CO2     20 - 31 mmol/L 27  Glucose     65 - 99 mg/dL 94  BUN     7 - 25 mg/dL 14  Creatinine     0.60 - 0.93 mg/dL 0.79  Total Bilirubin     0.2 - 1.2 mg/dL 0.4  Alkaline Phosphatase     33 - 130 U/L 67  AST     10 - 35 U/L 13  ALT     6 - 29 U/L 11  Total Protein     6.1 - 8.1 g/dL 6.6  Albumin     3.6 - 5.1 g/dL 3.8  Calcium     8.6 - 10.4 mg/dL 9.1   Component     Latest Ref Rng & Units 07/22/2016  TSH     mIU/L 1.06  Thyroperoxidase Ab SerPl-aCnc     <9 IU/mL <1  Thyroglobulin Ab     <2 IU/mL <1  Histamine Release     <16 % <16   Component     Latest Ref Rng & Units 07/22/2016  Allergen, D pternoyssinus,d7     kU/L <0.10  D. farinae     kU/L <0.10  Cat Dander     kU/L <0.10  Dog Dander     kU/L <0.10  [place] Grass     kU/L <0.10  Meadow Grass     kU/L <0.10  [place] Grass     kU/L <0.10  [place] Grass     kU/L <0.10  Cockroach     kU/L <0.10  Allergen, P. notatum, m1     kU/L <0.10  Allergen, C. Herbarum, M2     kU/L <0.10  Aspergillus fumigatus, m3     kU/L <0.10  Allergen, Mucor Racemosus, M4     kU/L <0.10  Allergen, A. alternata, m6     kU/L <0.10  Allergen, S. Botryosum, m10     kU/L <0.10  Box Elder IgE     kU/L <0.10  Allergen, Comm Silver Wendee Copp, t9     kU/L <0.10  Allergen, Cedar tree, t12     kU/L <0.10  Allergen, Oak,t7     kU/L <0.10  Elm  IgE     kU/L <0.10  Pecan/Hickory Tree IgE     kU/L <0.10  Allergen, Mulberry, t76     kU/L <0.10  Common Ragweed     kU/L <0.10  Plantain     kU/L <0.10  Rough Pigweed  IgE     kU/L <0.10  Nettle     kU/L <0.10  Allergen, Black Locust, Acacia9     kU/L <0.10   Component     Latest Ref Rng & Units 07/22/2016  Sed Rate     0 - 30 mm/hr 17  CRP     <8.0 mg/L 4.4  Tryptase     <11 ug/L 9.3   Component     Latest Ref Rng & Units 07/22/2016  Cashew IgE     kU/L <0.10    Allergy testing: Environmental skin prick testing was slightly positive for dog and dust mite (d.far). Cashew skin prick testing slightly positive at 5x32m.  Allergy testing results were read and interpreted by provider, documented by clinical staff.   Assessment and plan:   Food allergy - Cashew IgE level was negative. Skin prick testing today is positive however it is small enough to undergo an in-office challenge to cashew if you want to eat cashews in the future. - will prescribe Epipen and demonstrated use.  Follow emergency action plan in case of accidental ingestion.      - continue avoidance of cashew  Allergic rhinitis  - environmental allergy testing was positive for dust mite and dog.  Allergen avoidance measures discussed and provided.   - continue Zyrtec as below.   - start Nasacort 1-2 sprays each nostril daily.  Use for 1-2 weeks at a time before discontinuing once improved.    Urticaria without angioedema - at this time no identifiable trigger to your hives.  Lab workup for hives was reassuring and all normal. There are no concerning features at this time with your hives.  - continue Zyrtec 166mtwice a day (in AM and PM) and Zantac [id number] a day with Zyrtec if having ongoing hives - let us [place] if you have any swelling, fevers, or if rash leaves any marks or bruising  Follow-up 6  Months or sooner if needed   I appreciate the opportunity to take part in [place]. Please do  not hesitate to contact me with questions.  Sincerely,   ShaPrudy FeelerD [facility] [place]

## 2016-11-21 NOTE — Patient Instructions (Addendum)
Food allergy - Cashew IgE level was negative. Skin prick testing today is positive however it is small enough to undergo an in-office challenge to cashew if you want to eat cashews in the future. - will prescribe Epipen and demonstrated use.  Follow emergency action plan in case of accidental ingestion.      - continue avoidance of cashew  Allergic rhinitis  - environmental allergy testing was positive for dust mite and dog.  Allergen avoidance measures discussed and provided.   - continue Zyrtec as below.   - start Nasacort 1-2 sprays each nostril daily.  Use for 1-2 weeks at a time before discontinuing once improved.    Hives - at this time no identifiable trigger to your hives.  Lab workup for hives was reassuring and all normal. There are no concerning features at this time with your hives.  - continue Zyrtec 10mg  twice a day (in AM and PM) and Zantac 150mg  twice a day with Zyrtec if having ongoing hives - let us know if you have any swelling, fevers, or if rash leaves any marks or bruising  Follow-up 6  Months or sooner if needed

## 2016-12-16 ENCOUNTER — Ambulatory Visit (INDEPENDENT_AMBULATORY_CARE_PROVIDER_SITE_OTHER): Payer: Medicare HMO | Admitting: Pediatrics

## 2016-12-16 ENCOUNTER — Encounter: Payer: Self-pay | Admitting: Pediatrics

## 2016-12-16 VITALS — BP 136/71 | HR 53 | Temp 98.3°F | Ht 60.0 in | Wt 189.0 lb

## 2016-12-16 DIAGNOSIS — R413 Other amnesia: Secondary | ICD-10-CM | POA: Diagnosis not present

## 2016-12-16 DIAGNOSIS — M1610 Unilateral primary osteoarthritis, unspecified hip: Secondary | ICD-10-CM | POA: Diagnosis not present

## 2016-12-16 DIAGNOSIS — K5900 Constipation, unspecified: Secondary | ICD-10-CM

## 2016-12-16 DIAGNOSIS — R7303 Prediabetes: Secondary | ICD-10-CM

## 2016-12-16 LAB — BAYER DCA HB A1C WAIVED: HB A1C: 5.8 % (ref <7.0)

## 2016-12-16 NOTE — Progress Notes (Signed)
  Subjective:   Patient ID: [name], female    DOB: 1945/12/25, 71 y.o.   MRN: [id number] CC: Follow-up (3 month) multiple med problems HPI: [name] is a 71 y.o. female presenting for Follow-up (3 month)  Starting to have a harder time remembering names Thinks over past 6 mo memory has been worse Drives past where she means to go, but knows where she is going, turns around to get there  Arthritis:  Takes 4 tabs of 5 mg percocet daily for arthritis Following with pain management  Constipation: takes miralax regularly, sennakot Still having some trouble going daily On chronic narcotic therapy through pain management for arthritis pain  Prediabetes: Avoiding sugar sometimes Limited activity due to arthritis  Relevant past medical, surgical, family and social history reviewed. Allergies and medications reviewed and updated. History  Smoking Status  . Never Smoker  Smokeless Tobacco  . Never Used   ROS: Per HPI   Objective:    BP 136/71   Pulse (!) 53   Temp 98.3 F (36.8 C) (Oral)   Ht 5' (1.524 m)   Wt 189 lb (85.7 kg)   BMI 36.91 kg/m   Wt Readings from Last 3 Encounters:  12/16/16 189 lb (85.7 kg)  09/13/16 183 lb 9.6 oz (83.3 kg)  09/06/16 184 lb (83.5 kg)    Gen: NAD, alert, cooperative with exam, NCAT EYES: EOMI, no conjunctival injection, or no icterus CV: NRRR, normal S1/S2, no murmur, distal pulses 2+ b/l Resp: CTABL, no wheezes, normal WOB Abd: +BS, soft, NTND. no guarding or organomegaly Ext: No edema, warm Neuro: Alert and oriented MSK: normal muscle bulk  Assessment & Plan:  Shateria was seen today for follow-up med problems  Diagnoses and all orders for this visit:  Prediabetes Stable A1c 5.8 Cont to avoid sugary foods -     Bayer DCA Hb A1c Waived  Neuropathy Takes gabapentin with some improvement of symptoms, cont  Memory loss Trouble remembering names at times Still driving, never gotten lost Will check labs -      Vitamin B12 -     Folate -     BMP8+EGFR -     TSH  Primary osteoarthritis of hip, unspecified laterality Following with pain management  Constipation, unspecified constipation type Trial linzess Let me know if not improving  Follow up plan: Return in about 3 months (around 03/18/2017). MMSE next visit [name], MD [place]

## 2016-12-17 LAB — BMP8+EGFR
BUN/Creatinine Ratio: 22 (ref 12–28)
BUN: 18 mg/dL (ref 8–27)
CALCIUM: 9.5 mg/dL (ref 8.7–10.3)
CHLORIDE: 100 mmol/L (ref 96–106)
CO2: 25 mmol/L (ref 20–29)
Creatinine, Ser: 0.82 mg/dL (ref 0.57–1.00)
GFR calc Af Amer: 83 mL/min/{1.73_m2} (ref >59)
GFR calc non Af Amer: 72 mL/min/{1.73_m2} (ref >59)
Glucose: 77 mg/dL (ref 65–99)
POTASSIUM: 5.1 mmol/L (ref 3.5–5.2)
Sodium: 140 mmol/L (ref 134–144)

## 2016-12-17 LAB — TSH: TSH: 1.13 u[IU]/mL (ref 0.450–4.500)

## 2016-12-17 LAB — FOLATE: FOLATE: 11.7 ng/mL (ref >3.0)

## 2016-12-17 LAB — VITAMIN B12: VITAMIN B 12: 268 pg/mL (ref 232–1245)

## 2016-12-29 ENCOUNTER — Other Ambulatory Visit: Payer: Self-pay | Admitting: Allergy

## 2017-02-07 ENCOUNTER — Telehealth: Payer: Self-pay

## 2017-02-07 DIAGNOSIS — M961 Postlaminectomy syndrome, not elsewhere classified: Secondary | ICD-10-CM | POA: Diagnosis not present

## 2017-02-07 DIAGNOSIS — M47818 Spondylosis without myelopathy or radiculopathy, sacral and sacrococcygeal region: Secondary | ICD-10-CM | POA: Diagnosis not present

## 2017-02-07 DIAGNOSIS — M47816 Spondylosis without myelopathy or radiculopathy, lumbar region: Secondary | ICD-10-CM | POA: Diagnosis not present

## 2017-02-07 DIAGNOSIS — M461 Sacroiliitis, not elsewhere classified: Secondary | ICD-10-CM | POA: Diagnosis not present

## 2017-02-07 DIAGNOSIS — Z79899 Other long term (current) drug therapy: Secondary | ICD-10-CM | POA: Diagnosis not present

## 2017-02-07 DIAGNOSIS — M7071 Other bursitis of hip, right hip: Secondary | ICD-10-CM | POA: Diagnosis not present

## 2017-03-01 DIAGNOSIS — R8279 Other abnormal findings on microbiological examination of urine: Secondary | ICD-10-CM | POA: Diagnosis not present

## 2017-03-01 DIAGNOSIS — N3000 Acute cystitis without hematuria: Secondary | ICD-10-CM | POA: Diagnosis not present

## 2017-03-20 ENCOUNTER — Ambulatory Visit (INDEPENDENT_AMBULATORY_CARE_PROVIDER_SITE_OTHER): Payer: Medicare HMO | Admitting: Pediatrics

## 2017-03-20 ENCOUNTER — Encounter: Payer: Self-pay | Admitting: Pediatrics

## 2017-03-20 VITALS — BP 129/64 | HR 53 | Temp 97.0°F | Ht 60.0 in | Wt 191.0 lb

## 2017-03-20 DIAGNOSIS — D485 Neoplasm of uncertain behavior of skin: Secondary | ICD-10-CM

## 2017-03-20 DIAGNOSIS — R0683 Snoring: Secondary | ICD-10-CM

## 2017-03-20 DIAGNOSIS — Z23 Encounter for immunization: Secondary | ICD-10-CM | POA: Diagnosis not present

## 2017-03-20 DIAGNOSIS — I10 Essential (primary) hypertension: Secondary | ICD-10-CM | POA: Diagnosis not present

## 2017-03-20 DIAGNOSIS — G8929 Other chronic pain: Secondary | ICD-10-CM

## 2017-03-20 DIAGNOSIS — R7303 Prediabetes: Secondary | ICD-10-CM

## 2017-03-20 DIAGNOSIS — L821 Other seborrheic keratosis: Secondary | ICD-10-CM | POA: Diagnosis not present

## 2017-03-20 MED ORDER — AMITRIPTYLINE HCL 25 MG PO TABS: 25.0000 mg | ORAL_TABLET | Freq: Every day | ORAL | 0 refills | Status: DC

## 2017-03-20 NOTE — Progress Notes (Signed)
  Subjective:   Patient ID: [name], female    DOB: [birth year], 71 y.o.   MRN: [id number] CC: Follow-up medical problems HPI: [name] is a 71 y.o. female presenting for follow-up medical problems Pre-diabetes: avoiding sugary foods  Has a headache R side of her head a few days a week Doesn't take anything for it Zyrtec has not helped the headache Thinks her allergies are doing OK as long as she is not around dogs  Sometimes has sleepiness during the day Feels well rested when she wakes up Doesn't wake herself up snoring, says family does say she snores loudly sometimes Sleeps propped up on pillows for comfort  Has a mole that itches on L upper chest Bothered by skin tag R axilla, also with some itching  HTN: No chest pain, taking BP meds regularly   Relevant past medical, surgical, family and social history reviewed. Allergies and medications reviewed and updated. Social History   Tobacco Use  Smoking Status Never Smoker  Smokeless Tobacco Never Used   ROS: Per HPI   Objective:    BP 129/64   Pulse (!) 53   Temp (!) 97 F (36.1 C) (Oral)   Ht 5' (1.524 m)   Wt 191 lb (86.6 kg)   BMI 37.30 kg/m   Wt Readings from Last 3 Encounters:  03/20/17 191 lb (86.6 kg)  12/16/16 189 lb (85.7 kg)  09/13/16 183 lb 9.6 oz (83.3 kg)    Gen: NAD, alert, cooperative with exam, NCAT EYES: EOMI, no conjunctival injection, or no icterus ENT:   OP without erythema LYMPH: no cervical LAD CV: NRRR, normal S1/S2, no murmur, distal pulses 2+ b/l Resp: CTABL, no wheezes, normal WOB Abd: +BS, soft, NTND.  Ext: No edema, warm Neuro: Alert and oriented, strength equal b/l UE and LE, coordination grossly normal MSK: normal muscle bulk Skin: flesh colored and brown flat topped papule apprx 65mm L upper chest, parts of surface verrucous R axilla with 6mm pedunculated papule extending apprx 80mm from surface of skin   Assessment & Plan:  71 year old female here for  follow-up of medical problems  Diagnoses and all orders for this visit:  Prediabetes Lifestyle changes and weight loss  Other chronic pain Stable, following with chronic pain Continue below for help with sleep -     amitriptyline (ELAVIL) 25 MG tablet; Take 1 tablet (25 mg total) at bedtime by mouth.  Essential hypertension Well-controlled, continue current meds  Neoplasm of uncertain behavior of skin Discussed options, alternatives Will proceed with biopsy of 2 lesions, chest and right axilla -     Pathology -     Pathology  Encounter for immunization -     Flu vaccine HIGH DOSE PF  Snoring Patient prefers to defer further workup at this time  PROCEDURE: Shave biopsy:  Discussed risks, benefits and alternatives, pt agreed to proceed with biopsy of skin lesion on L upper chest and R axilla. Lesion and surrounding skin cleaned with betadine. 78mL of lidocaine with epi injected under each lesion. Shave biopsy completed with dermablade, hemostasis achieved with pressure. Specimen sent for pathology.  Patient tolerated well.   Follow up plan: Return in about 3 months (around 06/20/2017). [name], MD [place]

## 2017-03-22 LAB — PATHOLOGY

## 2017-04-21 ENCOUNTER — Other Ambulatory Visit: Payer: Self-pay | Admitting: Allergy

## 2017-05-04 DIAGNOSIS — Z6836 Body mass index (BMI) 36.0-36.9, adult: Secondary | ICD-10-CM | POA: Diagnosis not present

## 2017-05-04 DIAGNOSIS — M961 Postlaminectomy syndrome, not elsewhere classified: Secondary | ICD-10-CM | POA: Diagnosis not present

## 2017-05-04 DIAGNOSIS — M47816 Spondylosis without myelopathy or radiculopathy, lumbar region: Secondary | ICD-10-CM | POA: Diagnosis not present

## 2017-05-04 DIAGNOSIS — R69 Illness, unspecified: Secondary | ICD-10-CM | POA: Diagnosis not present

## 2017-05-04 DIAGNOSIS — M461 Sacroiliitis, not elsewhere classified: Secondary | ICD-10-CM | POA: Diagnosis not present

## 2017-05-04 DIAGNOSIS — M7071 Other bursitis of hip, right hip: Secondary | ICD-10-CM | POA: Diagnosis not present

## 2017-05-04 DIAGNOSIS — Z79899 Other long term (current) drug therapy: Secondary | ICD-10-CM | POA: Diagnosis not present

## 2017-05-04 DIAGNOSIS — I10 Essential (primary) hypertension: Secondary | ICD-10-CM | POA: Diagnosis not present

## 2017-05-26 ENCOUNTER — Ambulatory Visit (INDEPENDENT_AMBULATORY_CARE_PROVIDER_SITE_OTHER): Payer: Medicare HMO | Admitting: Family Medicine

## 2017-05-26 ENCOUNTER — Encounter: Payer: Self-pay | Admitting: Family Medicine

## 2017-05-26 VITALS — BP 133/76 | HR 65 | Temp 97.4°F | Ht 60.0 in | Wt 186.0 lb

## 2017-05-26 DIAGNOSIS — G8929 Other chronic pain: Secondary | ICD-10-CM | POA: Diagnosis not present

## 2017-05-26 DIAGNOSIS — M546 Pain in thoracic spine: Secondary | ICD-10-CM | POA: Diagnosis not present

## 2017-05-26 DIAGNOSIS — L308 Other specified dermatitis: Secondary | ICD-10-CM

## 2017-05-26 DIAGNOSIS — J029 Acute pharyngitis, unspecified: Secondary | ICD-10-CM | POA: Diagnosis not present

## 2017-05-26 DIAGNOSIS — J020 Streptococcal pharyngitis: Secondary | ICD-10-CM

## 2017-05-26 LAB — RAPID STREP SCREEN (MED CTR MEBANE ONLY): Strep Gp A Ag, IA W/Reflex: POSITIVE — AB

## 2017-05-26 MED ORDER — GABAPENTIN 300 MG PO CAPS: 600.0000 mg | ORAL_CAPSULE | Freq: Every day | ORAL | 3 refills | Status: DC

## 2017-05-26 MED ORDER — AMOXICILLIN-POT CLAVULANATE 875-125 MG PO TABS: 1.0000 | ORAL_TABLET | Freq: Two times a day (BID) | ORAL | 0 refills | Status: DC

## 2017-05-26 NOTE — Progress Notes (Signed)
Chief Complaint  Patient presents with  . Sore Throat    pt here today c/o sore throat    HPI  Patient presents today for Patient presents with moderate sore throat. Patient reports no cough. Shedoes have some runny nose due to allergy to dogs in the home.There is no fever, chills or sweats. The patient denies being short of breath. Onset was on arising this morning. In her usual state of health yesterday.  Pt. States she has a nsgging pain at the right upper back that has been getting worse in severity, 4-6/10 and now occurring daily. Can last all day. Gabapentin no longer controlling it.  Pt. Also having dry, scaly fingertips. Painful when they peel down to the edge of the nail. She does use reular household cleaning products without using gloves.  PMH: Smoking status noted ROS: Review of Systems  Constitutional: Negative for activity change, appetite change and fever.  HENT: Positive for rhinorrhea and sore throat. Negative for congestion.   Eyes: Negative for visual disturbance.  Respiratory: Negative for cough and shortness of breath.   Cardiovascular: Negative for chest pain and palpitations.  Gastrointestinal: Negative for abdominal pain, diarrhea and nausea.  Genitourinary: Negative for dysuria.  Musculoskeletal: Positive for back pain and myalgias. Negative for arthralgias.     Objective: BP 133/76   Pulse 65   Temp (!) 97.4 F (36.3 C) (Oral)   Ht 5' (1.524 m)   Wt 186 lb (84.4 kg)   BMI 36.33 kg/m  Gen: NAD, alert, cooperative with exam HEENT: NCAT, Nasal passages swollen, red TMS RED CV: RRR, good S1/S2, no murmur Resp: Bronchitis changes with scattered wheezes, non-labored Ext: No edema, warm Neuro: Alert and oriented, No gross deficits  Assessment and plan:  1. Sore throat   2. Chronic right-sided thoracic back pain   3. Other chronic pain   4. Strep pharyngitis   5. Other eczema     Meds ordered this encounter  Medications  . gabapentin (NEURONTIN)  300 MG capsule    Sig: Take 2 capsules (600 mg total) by mouth at bedtime.    Dispense:  180 capsule    Refill:  3  . amoxicillin-clavulanate (AUGMENTIN) 875-125 MG tablet    Sig: Take 1 tablet by mouth 2 (two) times daily. Take all of this medication    Dispense:  20 tablet    Refill:  0    Orders Placed This Encounter  Procedures  . Rapid Strep Screen (Not at [facility])   Where a white cotton gloves inside of a rubber glove in order to protect the hands while you are doing housework and involves what surfaces chemicals etc.  This includes basic laundry detergent which detergent etc.  Moisturize twice daily with Cetaphil or similar lotion. Follow up as needed.  Claretta Fraise, MD

## 2017-05-26 NOTE — Patient Instructions (Signed)
Use Cetaphil lotion on hands twice daily. Wear cotton gloves instead of rubber gloves when using household cleaning products.

## 2017-06-21 ENCOUNTER — Encounter: Payer: Self-pay | Admitting: Pediatrics

## 2017-06-21 ENCOUNTER — Other Ambulatory Visit: Payer: Self-pay | Admitting: Pediatrics

## 2017-06-21 ENCOUNTER — Ambulatory Visit (INDEPENDENT_AMBULATORY_CARE_PROVIDER_SITE_OTHER): Payer: Medicare HMO | Admitting: Pediatrics

## 2017-06-21 ENCOUNTER — Ambulatory Visit (INDEPENDENT_AMBULATORY_CARE_PROVIDER_SITE_OTHER): Payer: Medicare HMO

## 2017-06-21 VITALS — BP 122/70 | HR 75 | Temp 98.3°F | Ht 60.0 in | Wt 183.8 lb

## 2017-06-21 DIAGNOSIS — M25511 Pain in right shoulder: Secondary | ICD-10-CM

## 2017-06-21 DIAGNOSIS — J069 Acute upper respiratory infection, unspecified: Secondary | ICD-10-CM | POA: Diagnosis not present

## 2017-06-21 DIAGNOSIS — Z6835 Body mass index (BMI) 35.0-35.9, adult: Secondary | ICD-10-CM

## 2017-06-21 DIAGNOSIS — I10 Essential (primary) hypertension: Secondary | ICD-10-CM

## 2017-06-21 DIAGNOSIS — G8929 Other chronic pain: Secondary | ICD-10-CM

## 2017-06-21 DIAGNOSIS — E559 Vitamin D deficiency, unspecified: Secondary | ICD-10-CM

## 2017-06-21 DIAGNOSIS — R6889 Other general symptoms and signs: Secondary | ICD-10-CM | POA: Diagnosis not present

## 2017-06-21 DIAGNOSIS — M19011 Primary osteoarthritis, right shoulder: Secondary | ICD-10-CM | POA: Diagnosis not present

## 2017-06-21 DIAGNOSIS — R7303 Prediabetes: Secondary | ICD-10-CM | POA: Diagnosis not present

## 2017-06-21 LAB — VERITOR FLU A/B WAIVED
INFLUENZA A: NEGATIVE
INFLUENZA B: NEGATIVE

## 2017-06-21 MED ORDER — GABAPENTIN 300 MG PO CAPS: 600.0000 mg | ORAL_CAPSULE | Freq: Every day | ORAL | 3 refills | Status: DC

## 2017-06-21 NOTE — Progress Notes (Signed)
Subjective:   Patient ID: [name], female    DOB: 29-Nov-1945, 72 y.o.   MRN: [id number] CC: Follow-up (3 month) multiple med problems  HPI: [name] is a 72 y.o. female presenting for Follow-up (3 month)  Prediabetes: avoiding sugary foods, trying to stay active  Elevated BMI: Lost about 8 pounds in the last 2 months.  Patient is pleased with progress.  Planning to continue to increase healthy eating choices  R shoulder pain: Most bothered by right shoulder pain today.  She says when her shoulder hands at her side she has some pain, sometimes tingling behind her shoulder around her shoulder blade and into her upper arm.  She denies any pains that go from her shoulder down to her hand.  Pain is dull.  Moving it sometimes exacerbates the pain.  She has full range of motion in her shoulders is able to do what she wants to but has to stop early due to pain in the shoulder.  Chronic pain in back and hips: Follows with pain management.  Taking oxycodone about four times a day.  Takes gabapentin at night with some improvement in symptoms.  Hypertension: Taking blood pressure medicine regularly.  No lightheadedness, no chest pain, no shortness of breath  Relevant past medical, surgical, family and social history reviewed. Allergies and medications reviewed and updated. Social History   Tobacco Use  Smoking Status Never Smoker  Smokeless Tobacco Never Used   ROS: Per HPI   Objective:    BP 122/70   Pulse 75   Temp 98.3 F (36.8 C) (Oral)   Ht 5' (1.524 m)   Wt 183 lb 12.8 oz (83.4 kg)   BMI 35.90 kg/m   Wt Readings from Last 3 Encounters:  06/21/17 183 lb 12.8 oz (83.4 kg)  05/26/17 186 lb (84.4 kg)  03/20/17 191 lb (86.6 kg)    Gen: NAD, alert, cooperative with exam, NCAT EYES: EOMI, no conjunctival injection, or no icterus ENT:  TMs with clear effusion b/l, OP with mild erythema LYMPH: no cervical LAD CV: NRRR, normal S1/S2, no murmur, distal pulses 2+  b/l Resp: CTABL, no wheezes, normal WOB Abd: +BS, soft, NTND. Ext: No edema, warm Neuro: Alert and oriented MSK:  Right shoulder slightly tender to palpation over AC joint.  No pain anterior posterior shoulder. Normal range of motion bilateral shoulders, extension up to 180 degrees.  Rotator cuff muscles intact.  Internal and external rotation bilateral shoulders equal, no pain with internal or external rotation of the shoulder against resistance bilaterally.  Assessment & Plan:  Elzabeth was seen today for follow-up multiple med problems  Diagnoses and all orders for this visit:  Essential hypertension Stable, cont below -     CMP14+EGFR  Flu-like symptoms Flu neg -     Veritor Flu A/B Waived  Chronic right shoulder pain Rest, ice, topical NSAIDs, can take 1-2 tabs of ibuprofen occasionally as needed -     DG Shoulder Right; Future  Other chronic pain Follows with pain medicine Also on oxycodone -     gabapentin (NEURONTIN) 300 MG capsule; Take 2 capsules (600 mg total) by mouth at bedtime.  Prediabetes Cont lifestyle changes, increase activitiy levels  Class 2 severe obesity with serious comorbidity and body mass index (BMI) of 35.0 to 35.9 in adult, unspecified obesity type (HCC) Weight down, cont to increase activity level as able  Acute URI Discussed symptom care, return precautions  Follow up plan: Return in about 3  months (around 09/18/2017). [name], MD [place]

## 2017-06-22 LAB — CMP14+EGFR
ALK PHOS: 69 IU/L (ref 39–117)
ALT: 15 IU/L (ref 0–32)
AST: 16 IU/L (ref 0–40)
Albumin/Globulin Ratio: 2 (ref 1.2–2.2)
Albumin: 4.4 g/dL (ref 3.5–4.8)
BILIRUBIN TOTAL: 0.5 mg/dL (ref 0.0–1.2)
BUN/Creatinine Ratio: 12 (ref 12–28)
BUN: 11 mg/dL (ref 8–27)
CHLORIDE: 99 mmol/L (ref 96–106)
CO2: 24 mmol/L (ref 20–29)
Calcium: 9.4 mg/dL (ref 8.7–10.3)
Creatinine, Ser: 0.91 mg/dL (ref 0.57–1.00)
GFR calc non Af Amer: 63 mL/min/{1.73_m2} (ref >59)
GFR, EST AFRICAN AMERICAN: 73 mL/min/{1.73_m2} (ref >59)
Globulin, Total: 2.2 g/dL (ref 1.5–4.5)
Glucose: 99 mg/dL (ref 65–99)
Potassium: 4.1 mmol/L (ref 3.5–5.2)
Sodium: 140 mmol/L (ref 134–144)
TOTAL PROTEIN: 6.6 g/dL (ref 6.0–8.5)

## 2017-06-22 LAB — VITAMIN D 25 HYDROXY (VIT D DEFICIENCY, FRACTURES): VIT D 25 HYDROXY: 34.3 ng/mL (ref 30.0–100.0)

## 2017-06-25 ENCOUNTER — Encounter (HOSPITAL_COMMUNITY): Payer: Self-pay

## 2017-06-25 ENCOUNTER — Emergency Department (HOSPITAL_COMMUNITY)
Admission: EM | Admit: 2017-06-25 | Discharge: 2017-06-25 | Disposition: A | Discharge status: Home / Self Care | Payer: Medicare HMO | Attending: Emergency Medicine | Admitting: Emergency Medicine

## 2017-06-25 ENCOUNTER — Other Ambulatory Visit: Payer: Self-pay

## 2017-06-25 DIAGNOSIS — M542 Cervicalgia: Secondary | ICD-10-CM | POA: Diagnosis not present

## 2017-06-25 DIAGNOSIS — Z96643 Presence of artificial hip joint, bilateral: Secondary | ICD-10-CM | POA: Insufficient documentation

## 2017-06-25 DIAGNOSIS — Z79899 Other long term (current) drug therapy: Secondary | ICD-10-CM | POA: Diagnosis not present

## 2017-06-25 DIAGNOSIS — Z96651 Presence of right artificial knee joint: Secondary | ICD-10-CM | POA: Diagnosis not present

## 2017-06-25 DIAGNOSIS — I10 Essential (primary) hypertension: Secondary | ICD-10-CM | POA: Insufficient documentation

## 2017-06-25 DIAGNOSIS — R590 Localized enlarged lymph nodes: Secondary | ICD-10-CM | POA: Diagnosis not present

## 2017-06-25 DIAGNOSIS — R599 Enlarged lymph nodes, unspecified: Secondary | ICD-10-CM | POA: Diagnosis not present

## 2017-06-25 DIAGNOSIS — Z8542 Personal history of malignant neoplasm of other parts of uterus: Secondary | ICD-10-CM | POA: Diagnosis not present

## 2017-06-25 DIAGNOSIS — R221 Localized swelling, mass and lump, neck: Secondary | ICD-10-CM | POA: Diagnosis not present

## 2017-06-25 LAB — RAPID STREP SCREEN (MED CTR MEBANE ONLY): Streptococcus, Group A Screen (Direct): NEGATIVE

## 2017-06-25 NOTE — ED Triage Notes (Signed)
She c/o "lump" at left neck area and mild sore throat since yesterday. She denies fever, nor any other sign of current illness.

## 2017-06-25 NOTE — ED Provider Notes (Signed)
Medical screening examination/treatment/procedure(s) were conducted as a shared visit with non-physician practitioner(s) and myself.  I personally evaluated the patient during the encounter.   EKG Interpretation None      Patient seen by me along with physician assistant.  Patient noted a tender lymph node left anterior submandibular area at this morning.  Has not noted it before.  Not associated with pharyngitis or sore throat.  Palpable 1-2 cm lymph node left anterior.  No erythema on the skin.  Posterior pharynx clear without any exudate no erythema.  Left patient has primary care provider to follow-up with.  Recommend close follow-up with them and will treat symptomatically.  If symptoms persist may need to have the lymph node evaluated further.   Fredia Sorrow, MD 06/25/17 1327

## 2017-06-25 NOTE — Discharge Instructions (Signed)
The area in question appears to be a swollen lymph node.  Please undergo a trial of anti-inflammatory medications, such as ibuprofen.  Follow-up with your primary care doctor on this matter, as this will need to be monitored on a regular basis.  Return to the ED should you have difficulty breathing, difficulty swallowing, or any other major concerns.

## 2017-06-25 NOTE — ED Provider Notes (Signed)
[facility] DEPT Provider Note   CSN: [id number] Arrival date & time: 06/25/17  1001     History   Chief Complaint Chief Complaint  Patient presents with  . Sore Throat    HPI [name] is a 72 y.o. female.  HPI   [name] is a 72 y.o. female, with a history of HTN, hyperlipidemia, UTI, and SBO, presenting to the ED with tenderness to the left side of the neck beginning this morning.  Patient also notes some swelling.  States this is not happened before.  Denies sore throat, difficulty or pain with swallowing, difficulty breathing, pain elsewhere in the neck, chest pain, cough, or any other complaints.  Denies history of ENT cancers.  Denies active cancer.     Past Medical History:  Diagnosis Date  . Arthritis   . Colon polyp, hyperplastic 04/27/2012   Colonoscopy 2010.  No adenomatous polyps   . Endometrioid adenocarcinoma   . History of endometrial cancer s/p TAH BSO June2013 06/15/2011   Grade 1 endometrioid adenocarcinoma with focal superficial myometrial invasion of 0.1 cm with a myometrium of 1.8 cm or approximately 7%. She had a 5-cm tumor. No lymphovascular space Involvement.    . Hyperlipidemia   . Hypertension   . Mass of in fold of jejunum s/p SB resection 07/19/2012 07/20/2012  . Obesity (BMI 30-39.9) 04/27/2012  . Pyelonephritis 02/2016  . 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 SBO (small bowel obstruction) - recurrent 04/27/2012  . Ureteral adhesion, left 2010   s/p ureterolysis & stenting Dr. Larwance Sachs now  . UTI (urinary tract infection)     Patient Active Problem List   Diagnosis Date Noted  . Class 2 severe obesity with serious comorbidity and body mass index (BMI) of 35.0 to 35.9 in adult ([place]) 06/21/2017  . History of hydronephrosis   . Low back pain   . Abdominal pain 04/26/2016  . Left flank pain   . Intractable abdominal pain 04/25/2016    . Recurrent UTI 02/20/2016  . Peripheral neuropathy 02/08/2016  . Depression 02/08/2016  . OA (osteoarthritis) of hip 07/04/2014  . 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 Pseudoarthrosis L3- L5 12/17/2012  . Nocturnal leg cramps 11/13/2012  . HLD (hyperlipidemia) 08/10/2012  . Recurrent ventral incisional hernia s/p lap repair 04/23/2013 04/27/2012  . Obesity (BMI 30-39.9) 04/27/2012  . Chronic pain 04/27/2012  . Hypertension   . History of endometrial cancer s/p TAH BSO June2013 06/15/2011    Past Surgical History:  Procedure Laterality Date  . ABDOMINAL HYSTERECTOMY  11/02/10   TAH, BSO, lysis of adhesions for endometrial cancer  . [facility], 2009   anterior L2-3, L3-4 arthrodesis  . BACK SURGERY  12/17/2012   Spinal fusion and repair   . BLADDER SUSPENSION  2003  . CATARACT EXTRACTION, BILATERAL  2003  . EYE SURGERY    . FOOT SURGERY  06/2010   left foot surgery  . HERNIA REPAIR  07/19/12   lap Hardesty repair  . INCISIONAL HERNIA REPAIR  11/02/10  . INSERTION OF MESH N/A 04/23/2013   Procedure: INSERTION OF MESH;  Surgeon: Adin Hector, MD;  Location: [facility];  Service: General;  Laterality: N/A;  . JOINT REPLACEMENT  09/2009   Right total knee  . KIDNEY SURGERY  2010   Left kidney  . LAPAROSCOPIC LYSIS OF ADHESIONS N/A 04/23/2013  Procedure: LAPAROSCOPIC LYSIS OF ADHESIONS;  Surgeon: Adin Hector, MD;  Location: [facility];  Service: General;  Laterality: N/A;  . TOTAL HIP ARTHROPLASTY  01/2010   Right total hip  . TOTAL HIP ARTHROPLASTY Left 07/04/2014   Procedure: LEFT TOTAL HIP ARTHROPLASTY ANTERIOR APPROACH;  Surgeon: Gearlean Alf, MD;  Location: [facility];  Service: Orthopedics;  Laterality: Left;  . TOTAL KNEE ARTHROPLASTY     right  . URETEROLYSIS  2010   lap/open left ureterolysis & omental flap, ureter stent  . VENTRAL HERNIA REPAIR N/A 07/19/2012   Procedure: LAPAROSCOPIC LYSIS OF ADHESIONS, SMALL BOWEL  RESECTION, SEROSAL REPAIR, PRIMARY VENTRAL HERNIA REPAIR;  Surgeon: Adin Hector, MD;  Location: [facility];  Service: General;  Laterality: N/A;  . VENTRAL HERNIA REPAIR N/A 04/23/2013   Procedure: LAPAROSCOPIC exploration and repair of hernia in abdominal VENTRAL wall  HERNIA;  Surgeon: Adin Hector, MD;  Location: [facility];  Service: General;  Laterality: N/A;    OB History    No data available       Home Medications    Prior to Admission medications   Medication Sig Start Date End Date Taking? Authorizing Provider  amitriptyline (ELAVIL) 25 MG tablet TAKE 1 TABLET BY MOUTH ONCE DAILY AT BEDTIME 06/22/17   [name], MD  cetirizine (ZYRTEC) 10 MG tablet Take 1 tablet (10 mg total) by mouth 2 (two) times daily. 07/25/16   Kennith Gain, MD  gabapentin (NEURONTIN) 300 MG capsule Take 2 capsules (600 mg total) by mouth at bedtime. 06/21/17   [name], MD  lisinopril-hydrochlorothiazide (PRINZIDE,ZESTORETIC) 10-12.5 MG tablet Take 1 tablet by mouth daily. 09/13/16   [name], MD  oxyCODONE-acetaminophen (PERCOCET/ROXICET) 5-325 MG tablet Take by mouth every 6 (six) hours as needed for severe pain.    [provider]  sodium phosphate (FLEET) 7-19 GM/118ML ENEM Place 133 mLs (1 enema total) rectally daily as needed for severe constipation. 04/21/16   Dettinger, Fransisca Kaufmann, MD    Family History Family History  Problem Relation Age of Onset  . Diabetes Mother   . Cancer Mother        lung  . Diabetes Father   . Cancer Father        liver  . Colon cancer Sister   . Diabetes Sister   . Cancer Sister        colon  . Breast cancer Neg Hx   . Allergic rhinitis Neg Hx   . Angioedema Neg Hx   . Asthma Neg Hx   . Atopy Neg Hx   . Eczema Neg Hx   . Immunodeficiency Neg Hx   . Urticaria Neg Hx     Social History Social History   Tobacco Use  . Smoking status: Never Smoker  . Smokeless tobacco: Never Used  Substance Use Topics  . Alcohol use:  No  . Drug use: No     Allergies   Cashew nut oil; Sulfamethoxazole; and Zofran   Review of Systems Review of Systems  Constitutional: Negative for chills, diaphoresis and fever.  HENT: Negative for facial swelling, sore throat, trouble swallowing and voice change.   Respiratory: Negative for cough and shortness of breath.   Cardiovascular: Negative for chest pain.  Gastrointestinal: Negative for abdominal pain, diarrhea, nausea and vomiting.  Musculoskeletal: Negative for neck pain.  Hematological: Positive for adenopathy.  All other systems reviewed and are negative.    Physical Exam Updated Vital Signs BP Marland Kitchen)  176/80 (BP Location: Right Arm)   Pulse 60   Temp 97.9 F (36.6 C) (Oral)   Resp 18   Ht 5' (1.524 m)   Wt 83 kg (183 lb)   SpO2 98%   BMI 35.74 kg/m   Physical Exam  Constitutional: She appears well-developed and well-nourished. No distress.  HENT:  Head: Normocephalic and atraumatic.  Mouth/Throat: Oropharynx is clear and moist and mucous membranes are normal. No oropharyngeal exudate, posterior oropharyngeal edema, posterior oropharyngeal erythema or tonsillar abscesses.  No evidence of RPA or PTA.  Eyes: Conjunctivae are normal.  Neck: Normal range of motion. Neck supple.  What appears to be a single, tender, inflamed lymph node in the left anterior cervical chain.  No tenderness over the SCM muscle.  No submandibular or submental swelling or tenderness.  No pain with range of motion of the neck.  No noted surrounding edema or erythema. Patient handles oral secretions without difficulty.  Speaks in full sentences without noted hesitation or difficulty.  No trismus.  Cardiovascular: Normal rate, regular rhythm, normal heart sounds and intact distal pulses.  Pulmonary/Chest: Effort normal and breath sounds normal. No respiratory distress.  Abdominal: Soft. There is no tenderness. There is no guarding.  Musculoskeletal: She exhibits no edema.    Lymphadenopathy:    She has cervical adenopathy.  Neurological: She is alert.  Skin: Skin is warm and dry. She is not diaphoretic.  Psychiatric: She has a normal mood and affect. Her behavior is normal.  Nursing note and vitals reviewed.    ED Treatments / Results  Labs (all labs ordered are listed, but only abnormal results are displayed) Labs Reviewed  RAPID STREP SCREEN (NOT AT [facility])  CULTURE, GROUP A STREP [facility])    EKG  EKG Interpretation None       Radiology No results found.  Procedures Procedures (including critical care time)  Medications Ordered in ED Medications - No data to display   Initial Impression / Assessment and Plan / ED Course  I have reviewed the triage vital signs and the nursing notes.  Pertinent labs & imaging results that were available during my care of the patient were reviewed by me and considered in my medical decision making (see chart for details).      Patient presents with what appears to be a tender, inflamed lymph node.  No other symptoms presently.  We will treat symptomatically and have close PCP follow-up. The patient was given instructions for home care as well as return precautions. Patient voices understanding of these instructions, accepts the plan, and is comfortable with discharge.   Findings and plan of care discussed with Fredia Sorrow, MD. Dr. Rogene Houston personally evaluated and examined this patient.  Vitals:   06/25/17 1040 06/25/17 1320  BP: (!) 176/80 (!) 167/75  Pulse: 60 (!) 52  Resp: 18 20  Temp: 97.9 F (36.6 C) 97.6 F (36.4 C)  TempSrc: Oral Oral  SpO2: 98% 97%  Weight: 83 kg (183 lb)   Height: 5' (1.524 m)      Final Clinical Impressions(s) / ED Diagnoses   Final diagnoses:  Swollen lymph nodes    ED Discharge Orders    None       Layla Maw 06/25/17 1520    Fredia Sorrow, MD 06/25/17 1709

## 2017-06-27 LAB — CULTURE, GROUP A STREP (THRC)

## 2017-07-05 ENCOUNTER — Other Ambulatory Visit: Payer: Self-pay | Admitting: Pediatrics

## 2017-07-05 DIAGNOSIS — Z1231 Encounter for screening mammogram for malignant neoplasm of breast: Secondary | ICD-10-CM

## 2017-07-11 ENCOUNTER — Telehealth: Payer: Self-pay | Admitting: Pediatrics

## 2017-07-12 ENCOUNTER — Encounter: Payer: Self-pay | Admitting: Family Medicine

## 2017-07-12 ENCOUNTER — Ambulatory Visit (INDEPENDENT_AMBULATORY_CARE_PROVIDER_SITE_OTHER): Payer: Medicare HMO | Admitting: Family Medicine

## 2017-07-12 VITALS — BP 129/69 | HR 67 | Temp 97.0°F | Ht 60.0 in | Wt 185.0 lb

## 2017-07-12 DIAGNOSIS — J40 Bronchitis, not specified as acute or chronic: Secondary | ICD-10-CM

## 2017-07-12 MED ORDER — HYDROCODONE-HOMATROPINE 5-1.5 MG/5ML PO SYRP: 5.0000 mL | ORAL_SOLUTION | Freq: Four times a day (QID) | ORAL | 0 refills | Status: DC | PRN

## 2017-07-12 MED ORDER — PREDNISONE 20 MG PO TABS: ORAL_TABLET | ORAL | 0 refills | Status: DC

## 2017-07-12 NOTE — Progress Notes (Signed)
BP 129/69   Pulse 67   Temp (!) 97 F (36.1 C) (Oral)   Ht 5' (1.524 m)   Wt 185 lb (83.9 kg)   BMI 36.13 kg/m    Subjective:    Patient ID: [name], female    DOB: 13-Nov-1945, 72 y.o.   MRN: [id number]  HPI: [name] is a 72 y.o. female presenting on 07/12/2017 for Swollen lymph node right side of neck (went to ER end of February, no meds prescribed, told to watch it and be seen by PCP ) and Cough, chest congestion   HPI Swollen lymph node and cough and chest congestion Patient has been having cough and chest congestion and a swollen left side of her neck lymph node that she has noticed first about a week and a half ago.  She says the cough and chest congestion have improved over the past week and a half and she is still a little hoarse but things are improving and the lymph node has come down.  She does say that she was seen in the emergency department about 1 week ago and at that time they decided to treat her conservatively and told her to follow-up with [place].  She does feel like she is greatly improved and denies any shortness of breath or chest pain and is optimistic that we will continue to improve.  She says the lymph node has gone down significantly as it was very enlarged like the whole side of her neck was swollen up.  She still has a little bit of sore throat but denies any other pain in her neck currently.  She has taken Tylenol and DayQuil for this  Relevant past medical, surgical, family and social history reviewed and updated as indicated. Interim medical history since our last visit reviewed. Allergies and medications reviewed and updated.  Review of Systems  Constitutional: Negative for chills and fever.  HENT: Positive for congestion, postnasal drip, rhinorrhea, sinus pressure, sneezing and sore throat. Negative for ear discharge and ear pain.   Eyes: Negative for pain, redness and visual disturbance.  Respiratory: Positive for cough. Negative for chest  tightness and shortness of breath.   Cardiovascular: Negative for chest pain and leg swelling.  Genitourinary: Negative for difficulty urinating and dysuria.  Musculoskeletal: Negative for back pain and gait problem.  Skin: Negative for rash.  Neurological: Negative for light-headedness and headaches.  Psychiatric/Behavioral: Negative for agitation and behavioral problems.  All other systems reviewed and are negative.   Per HPI unless specifically indicated above        Objective:    BP 129/69   Pulse 67   Temp (!) 97 F (36.1 C) (Oral)   Ht 5' (1.524 m)   Wt 185 lb (83.9 kg)   BMI 36.13 kg/m   Wt Readings from Last 3 Encounters:  07/12/17 185 lb (83.9 kg)  06/25/17 183 lb (83 kg)  06/21/17 183 lb 12.8 oz (83.4 kg)    Physical Exam  Constitutional: She is oriented to person, place, and time. She appears well-developed and well-nourished. No distress.  HENT:  Right Ear: Tympanic membrane, external ear and ear canal normal.  Left Ear: Tympanic membrane, external ear and ear canal normal.  Nose: Mucosal edema and rhinorrhea present. No epistaxis. Right sinus exhibits no maxillary sinus tenderness and no frontal sinus tenderness. Left sinus exhibits no maxillary sinus tenderness and no frontal sinus tenderness.  Mouth/Throat: Uvula is midline and mucous membranes are normal. Posterior  oropharyngeal edema and posterior oropharyngeal erythema present. No oropharyngeal exudate or tonsillar abscesses.  Eyes: Conjunctivae and EOM are normal.  Neck: Neck supple. No thyromegaly present.  Cardiovascular: Normal rate, regular rhythm, normal heart sounds and intact distal pulses.  No murmur heard. Pulmonary/Chest: Effort normal and breath sounds normal. No respiratory distress. She has no wheezes. She has no rales.  Musculoskeletal: Normal range of motion. She exhibits no edema or tenderness.  Lymphadenopathy:    She has no cervical adenopathy (No currently palpable cervical lymph  nodes).  Neurological: She is alert and oriented to person, place, and time. Coordination normal.  Skin: Skin is warm and dry. No rash noted. She is not diaphoretic.  Psychiatric: She has a normal mood and affect. Her behavior is normal.  Vitals reviewed.       Assessment & Plan:   Problem List Items Addressed This Visit    None    Visit Diagnoses    Bronchitis    -  Primary   Relevant Medications   HYDROcodone-homatropine (HYCODAN) 5-1.5 MG/5ML syrup   predniSONE (DELTASONE) 20 MG tablet       Follow up plan: Return if symptoms worsen or fail to improve.  Counseling provided for all of the vaccine components No orders of the defined types were placed in this encounter.   Caryl Pina, MD [facility] 07/12/2017, 12:18 PM

## 2017-07-12 NOTE — Telephone Encounter (Signed)
appt scheduled

## 2017-07-17 ENCOUNTER — Ambulatory Visit (INDEPENDENT_AMBULATORY_CARE_PROVIDER_SITE_OTHER): Payer: Medicare HMO | Admitting: Family Medicine

## 2017-07-17 ENCOUNTER — Encounter: Payer: Self-pay | Admitting: Family Medicine

## 2017-07-17 VITALS — BP 115/65 | HR 96 | Temp 97.0°F | Ht 60.0 in | Wt 187.5 lb

## 2017-07-17 DIAGNOSIS — R399 Unspecified symptoms and signs involving the genitourinary system: Secondary | ICD-10-CM

## 2017-07-17 DIAGNOSIS — R3129 Other microscopic hematuria: Secondary | ICD-10-CM

## 2017-07-17 DIAGNOSIS — N12 Tubulo-interstitial nephritis, not specified as acute or chronic: Secondary | ICD-10-CM

## 2017-07-17 LAB — URINALYSIS
Bilirubin, UA: NEGATIVE
GLUCOSE, UA: NEGATIVE
KETONES UA: NEGATIVE
Nitrite, UA: POSITIVE — AB
Specific Gravity, UA: 1.02 (ref 1.005–1.030)
Urobilinogen, Ur: 0.2 mg/dL (ref 0.2–1.0)
pH, UA: 7 (ref 5.0–7.5)

## 2017-07-17 MED ORDER — CEFTRIAXONE SODIUM 500 MG IJ SOLR
500.0000 mg | Freq: Once | INTRAMUSCULAR | Status: AC
  Administered 2017-07-17: 500 mg via INTRAMUSCULAR

## 2017-07-17 MED ORDER — AMOXICILLIN-POT CLAVULANATE 875-125 MG PO TABS: 1.0000 | ORAL_TABLET | Freq: Two times a day (BID) | ORAL | 0 refills | Status: DC

## 2017-07-17 NOTE — Progress Notes (Signed)
Subjective:  Patient ID: [name], female    DOB: [birth year]  Age: 72 y.o. MRN: [id number]  CC: Dysuria   HPI [name] presents for burning and tearing sensation with urination starting yesterday.  She is having frequency and urgency as well.  She is also having some left upper quadrant pain and says she has had a bad kidney on the left.  She states that she has had multiple problems with urinary tract infections in the past and frequently ends up in the hospital with him.  She wants to have this when off.  She requests a antibiotic shot if possible.. Denies fever.  However she reports being very cold during the night last night.  No flank pain. No nausea, vomiting.    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   She has a past surgical history that includes Abdominal hysterectomy (11/02/10); Incisional hernia repair (11/02/10); Back surgery (1989, 1999, 2009); Kidney surgery (2010); Bladder suspension (2003); Foot surgery (06/2010); Joint replacement (09/2009); Total hip arthroplasty (01/2010); Cataract extraction, bilateral (2003); Total knee arthroplasty; Ureterolysis (2010); Ventral hernia repair (N/A, 07/19/2012); Hernia repair (07/19/12); Eye surgery; Back surgery (12/17/2012); Ventral hernia repair (N/A, 04/23/2013); Insertion of mesh (N/A,  04/23/2013); Laparoscopic lysis of adhesions (N/A, 04/23/2013); and Total hip arthroplasty (Left, 07/04/2014).   Her family history includes Cancer in her father, mother, and sister; Colon cancer in her sister; Diabetes in her father, mother, and sister.She reports that  has never smoked. she has never used smokeless tobacco. She reports that she does not drink alcohol or use drugs.    ROS Review of Systems  Constitutional: Positive for chills. Negative for diaphoresis and fever.  HENT: Negative for congestion.   Eyes: Negative for visual disturbance.  Respiratory: Negative for cough and shortness of breath.   Cardiovascular: Negative for chest pain and palpitations.  Gastrointestinal: Negative for constipation, diarrhea and nausea.  Genitourinary: Positive for dysuria, frequency and urgency. Negative for decreased urine volume, flank pain, hematuria, menstrual problem and pelvic pain.  Musculoskeletal: Negative for arthralgias and joint swelling.  Skin: Negative for rash.  Neurological: Negative for dizziness and numbness.    Objective:  BP 115/65   Pulse 96   Temp (!) 97 F (36.1 C) (Oral)   Ht 5' (1.524 m)   Wt 187 lb 8 oz (85 kg)   BMI 36.62 kg/m   BP Readings from Last 3 Encounters:  07/17/17 115/65  07/12/17 129/69  06/25/17 (!) 167/75    Wt Readings from Last 3 Encounters:  07/17/17 187 lb 8 oz (85 kg)  07/12/17 185 lb (83.9 kg)  06/25/17 183 lb (83 kg)     Physical Exam  Constitutional: She is oriented to person, place, and time. She appears well-developed and well-nourished.  HENT:  Head: Normocephalic and atraumatic.  Cardiovascular: Normal rate and regular rhythm.  No murmur heard. Pulmonary/Chest: Effort normal and breath sounds normal.  Abdominal: Soft. Bowel sounds  are normal. She exhibits no mass. There is tenderness (Left lower quadrant at the flank region). There is no rebound and no guarding.  Musculoskeletal: She exhibits no tenderness.    Neurological: She is alert and oriented to person, place, and time.  Skin: Skin is warm and dry.  Psychiatric: She has a normal mood and affect. Her behavior is normal.   Urinalysis is positive for nitrite and 3+ blood   Assessment & Plan:   [name] was seen today for dysuria.  Diagnoses and all orders for this visit:  Pyelonephritis  UTI symptoms -     Urine Culture -     Urinalysis -     cefTRIAXone (ROCEPHIN) injection 500 mg  Other microscopic hematuria  Other orders -     amoxicillin-clavulanate (AUGMENTIN) 875-125 MG tablet; Take 1 tablet by mouth 2 (two) times daily. Take all of this medication       I have discontinued [name]'s HYDROcodone-homatropine and predniSONE. I am also having her start on amoxicillin-clavulanate. Additionally, I am having her maintain her oxyCODONE-acetaminophen, sodium phosphate, cetirizine, lisinopril-hydrochlorothiazide, gabapentin, and amitriptyline. We administered cefTRIAXone.  Allergies as of 07/17/2017      Reactions   Cashew Nut Oil Hives   Sulfamethoxazole    Mouth sores   Zofran Hives, Nausea Only      Medication List        Accurate as of 07/17/17 11:59 AM. Always use your most recent med list.          amitriptyline 25 MG tablet Commonly known as:  ELAVIL TAKE 1 TABLET BY MOUTH ONCE DAILY AT BEDTIME   amoxicillin-clavulanate 875-125 MG tablet Commonly known as:  AUGMENTIN Take 1 tablet by mouth 2 (two) times daily. Take all of this medication   cetirizine 10 MG tablet Commonly known as:  ZYRTEC Take 1 tablet (10 mg total) by mouth 2 (two) times daily.   gabapentin 300 MG capsule Commonly known as:  NEURONTIN Take 2 capsules (600 mg total) by mouth at bedtime.   lisinopril-hydrochlorothiazide 10-12.5 MG tablet Commonly known as:  PRINZIDE,ZESTORETIC Take 1 tablet by mouth daily.   oxyCODONE-acetaminophen 5-325 MG tablet Commonly known as:  PERCOCET/ROXICET Take by mouth every 6 (six) hours  as needed for severe pain.   sodium phosphate 7-19 GM/118ML Enem Place 133 mLs (1 enema total) rectally daily as needed for severe constipation.        Follow-up: No Follow-up on file.  [name], M.D.

## 2017-07-19 LAB — URINE CULTURE

## 2017-07-31 ENCOUNTER — Ambulatory Visit (INDEPENDENT_AMBULATORY_CARE_PROVIDER_SITE_OTHER): Payer: Medicare HMO | Admitting: Family Medicine

## 2017-07-31 ENCOUNTER — Encounter: Payer: Self-pay | Admitting: Family Medicine

## 2017-07-31 VITALS — BP 136/76 | HR 97 | Temp 97.4°F | Ht 60.0 in | Wt 180.0 lb

## 2017-07-31 DIAGNOSIS — R0982 Postnasal drip: Secondary | ICD-10-CM | POA: Diagnosis not present

## 2017-07-31 DIAGNOSIS — J301 Allergic rhinitis due to pollen: Secondary | ICD-10-CM

## 2017-07-31 MED ORDER — FEXOFENADINE HCL 180 MG PO TABS: 180.0000 mg | ORAL_TABLET | Freq: Every day | ORAL | 11 refills | Status: DC

## 2017-07-31 NOTE — Progress Notes (Signed)
Subjective: UK:GURKY PCP: Whitney Maize, MD HCW:CBJSEGB [name] is a 72 y.o. female presenting to clinic today for:  1. Cough Patient notes a 3-week history of cough that is worse at nighttime.  She notes some shortness of breath with activity.  No wheeze, fevers, chills, congestion.  She notes that she uses Zyrtec daily.  Denies hemoptysis, wheeze, lower extremity edema orthopnea.  She reports rhinorrhea and postnasal drip.  Denies sore throat.  She is recently completed Augmentin 875 for urinary tract infection.   ROS: Per HPI  Allergies  Allergen Reactions  . Cashew Nut Oil Hives  . Sulfamethoxazole     Mouth sores  . Zofran Hives and Nausea Only   Past Medical History:  Diagnosis Date  . Arthritis   . Colon polyp, hyperplastic 04/27/2012   Colonoscopy 2010.  No adenomatous polyps   . Endometrioid adenocarcinoma   . History of endometrial cancer s/p TAH BSO June2013 06/15/2011   Grade 1 endometrioid adenocarcinoma with focal superficial myometrial invasion of 0.1 cm with a myometrium of 1.8 cm or approximately 7%. She had a 5-cm tumor. No lymphovascular space Involvement.    . Hyperlipidemia   . Hypertension   . Mass of in fold of jejunum s/p SB resection 07/19/2012 07/20/2012  . Obesity (BMI 30-39.9) 04/27/2012  . Pyelonephritis 02/2016  . 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 SBO (small bowel obstruction) - recurrent 04/27/2012  . Ureteral adhesion, left 2010   s/p ureterolysis & stenting Dr. Larwance [name] now  . UTI (urinary tract infection)     Current Outpatient Medications:  .  amitriptyline (ELAVIL) 25 MG tablet, TAKE 1 TABLET BY MOUTH ONCE DAILY AT BEDTIME, Disp: 90 tablet, Rfl: 0 .  amoxicillin-clavulanate (AUGMENTIN) 875-125 MG tablet, Take 1 tablet by mouth 2 (two) times daily. Take all of this medication, Disp: 20 tablet, Rfl: 0 .  cetirizine (ZYRTEC) 10 MG tablet, Take 1 tablet (10 mg total) by  mouth 2 (two) times daily., Disp: 60 tablet, Rfl: 6 .  gabapentin (NEURONTIN) 300 MG capsule, Take 2 capsules (600 mg total) by mouth at bedtime., Disp: 180 capsule, Rfl: 3 .  lisinopril-hydrochlorothiazide (PRINZIDE,ZESTORETIC) 10-12.5 MG tablet, Take 1 tablet by mouth daily., Disp: 90 tablet, Rfl: 3 .  oxyCODONE-acetaminophen (PERCOCET/ROXICET) 5-325 MG tablet, Take by mouth every 6 (six) hours as needed for severe pain., Disp: , Rfl:  .  sodium phosphate (FLEET) 7-19 GM/118ML ENEM, Place 133 mLs (1 enema total) rectally daily as needed for severe constipation., Disp: 230 mL, Rfl: 1 Social History   Socioeconomic History  . Marital status: Widowed    Spouse name: Not on file  . Number of children: Not on file  . Years of education: Not on file  . Highest education level: Not on file  Occupational History  . Not on file  Social Needs  . Financial resource strain: Not on file  . Food insecurity:    Worry: Not on file    Inability: Not on file  . Transportation needs:    Medical: Not on file    Non-medical: Not on file  Tobacco Use  . Smoking status: Never Smoker  . Smokeless tobacco: Never Used  Substance and Sexual Activity  . Alcohol use: No  . Drug use: No  . Sexual activity: Never  Lifestyle  . Physical activity:    Days per week: Not on file    Minutes per session: Not on file  .  Stress: Not on file  Relationships  . Social connections:    Talks on phone: Not on file    Gets together: Not on file    Attends religious service: Not on file    Active member of club or organization: Not on file    Attends meetings of clubs or organizations: Not on file    Relationship status: Not on file  . Intimate partner violence:    Fear of current or ex partner: Not on file    Emotionally abused: Not on file    Physically abused: Not on file    Forced sexual activity: Not on file  Other Topics Concern  . Not on file  Social History Narrative  . Not on file   Family History    Problem Relation Age of Onset  . Diabetes Mother   . Cancer Mother        lung  . Diabetes Father   . Cancer Father        liver  . Colon cancer Sister   . Diabetes Sister   . Cancer Sister        colon  . Breast cancer Neg Hx   . Allergic rhinitis Neg Hx   . Angioedema Neg Hx   . Asthma Neg Hx   . Atopy Neg Hx   . Eczema Neg Hx   . Immunodeficiency Neg Hx   . Urticaria Neg Hx     Objective: Office vital signs reviewed. BP 136/76   Pulse 97   Temp (!) 97.4 F (36.3 C) (Oral)   Ht 5' (1.524 m)   Wt 180 lb (81.6 kg)   SpO2 94%   BMI 35.15 kg/m   Physical Examination:  General: Awake, alert, well nourished, well appearing, No acute distress HEENT: Normal    Neck: No masses palpated. No lymphadenopathy    Ears: Tympanic membranes intact, normal light reflex, no erythema, no bulging    Eyes: PERRLA, extraocular membranes intact, sclera white    Nose: nasal turbinates moist, clear nasal discharge    Throat: moist mucus membranes, no erythema, no tonsillar exudate.  Airway is patent Cardio: regular rate and rhythm, S1S2 heard, no murmurs appreciated Pulm: clear to auscultation bilaterally, no wheezes, rhonchi or rales; normal work of breathing on room air MSK: Ambulates with assistance of cane.  Assessment/ Plan: 72 y.o. female   1. Post-nasal drip I suspect that her cough is related to postnasal drip.  Cardiopulmonary exam was unremarkable today.  She is status post treatment with Augmentin.  Doubt bacterial sinusitis at this point.  I have switched her Zyrtec over to Allegra 180 mg p.o. daily.  I have asked her to resume her Flonase nasal spray 2 sprays each nostril daily.  If symptoms are not improving or they worsen, she will follow-up with PCP.  I recommended at least to follow-up within the next 2 weeks for recheck.  2. Non-seasonal allergic rhinitis due to pollen As above.   Meds ordered this encounter  Medications  . fexofenadine (ALLEGRA ALLERGY) 180 MG  tablet    Sig: Take 1 tablet (180 mg total) by mouth daily.    Dispense:  30 tablet    Refill:  [name], [place] [phone]

## 2017-07-31 NOTE — Patient Instructions (Signed)
I think your cough is coming from a postnasal drip related to allergies.  I have replaced the Zyrtec with Allegra.  Take the Allegra 1 time per day.  I would like you to start using her Flonase nasal spray again.  Remember to use 2 sprays in each nostril 1 time a day.  If your symptoms have not improved in the next 2 weeks, please follow-up with Dr. Evette Doffing for recheck.  Allergic Rhinitis, Adult Allergic rhinitis is an allergic reaction that affects the mucous membrane inside the nose. It causes sneezing, a runny or stuffy nose, and the feeling of mucus going down the back of the throat (postnasal drip). Allergic rhinitis can be mild to severe. There are two types of allergic rhinitis:  Seasonal. This type is also called hay fever. It happens only during certain seasons.  Perennial. This type can happen at any time of the year.  What are the causes? This condition happens when the body's defense system (immune system) responds to certain harmless substances called allergens as though they were germs.  Seasonal allergic rhinitis is triggered by pollen, which can come from grasses, trees, and weeds. Perennial allergic rhinitis may be caused by:  House dust mites.  Pet dander.  Mold spores.  What are the signs or symptoms? Symptoms of this condition include:  Sneezing.  Runny or stuffy nose (nasal congestion).  Postnasal drip.  Itchy nose.  Tearing of the eyes.  Trouble sleeping.  Daytime sleepiness.  How is this diagnosed? This condition may be diagnosed based on:  Your medical history.  A physical exam.  Tests to check for related conditions, such as: ? Asthma. ? Pink eye. ? Ear infection. ? Upper respiratory infection.  Tests to find out which allergens trigger your symptoms. These may include skin or blood tests.  How is this treated? There is no cure for this condition, but treatment can help control symptoms. Treatment may include:  Taking medicines that  block allergy symptoms, such as antihistamines. Medicine may be given as a shot, nasal spray, or pill.  Avoiding the allergen.  Desensitization. This treatment involves getting ongoing shots until your body becomes less sensitive to the allergen. This treatment may be done if other treatments do not help.  If taking medicine and avoiding the allergen does not work, new, stronger medicines may be prescribed.  Follow these instructions at home:  Find out what you are allergic to. Common allergens include smoke, dust, and pollen.  Avoid the things you are allergic to. These are some things you can do to help avoid allergens: ? Replace carpet with wood, tile, or vinyl flooring. Carpet can trap dander and dust. ? Do not smoke. Do not allow smoking in your home. ? Change your heating and air conditioning filter at least once a month. ? During allergy season:  Keep windows closed as much as possible.  Plan outdoor activities when pollen counts are lowest. This is usually during the evening hours.  When coming indoors, change clothing and shower before sitting on furniture or bedding.  Take over-the-counter and prescription medicines only as told by your health care provider.  Keep all follow-up visits as told by your health care provider. This is important. Contact a health care provider if:  You have a fever.  You develop a persistent cough.  You make whistling sounds when you breathe (you wheeze).  Your symptoms interfere with your normal daily activities. Get help right away if:  You have shortness of breath.  Summary  This condition can be managed by taking medicines as directed and avoiding allergens.  Contact your health care provider if you develop a persistent cough or fever.  During allergy season, keep windows closed as much as possible. This information is not intended to replace advice given to you by your health care provider. Make sure you discuss any questions you  have with your health care provider. Document Released: 01/18/2001 Document Revised: 06/02/2016 Document Reviewed: 06/02/2016 Elsevier Interactive Patient Education  [employer].

## 2017-08-01 ENCOUNTER — Inpatient Hospital Stay (HOSPITAL_COMMUNITY)
Admission: RE | Admit: 2017-08-01 | Discharge: 2017-08-03 | DRG: 690 | Disposition: A | Discharge status: Home / Self Care | Payer: Medicare HMO | Source: Ambulatory Visit | Attending: Urology | Admitting: Urology

## 2017-08-01 ENCOUNTER — Encounter (HOSPITAL_COMMUNITY)
Admission: RE | Disposition: A | Discharge status: Home / Self Care | Payer: Self-pay | Source: Ambulatory Visit | Attending: Urology

## 2017-08-01 ENCOUNTER — Other Ambulatory Visit: Payer: Self-pay

## 2017-08-01 ENCOUNTER — Encounter (HOSPITAL_COMMUNITY): Payer: Self-pay | Admitting: Certified Registered Nurse Anesthetist

## 2017-08-01 ENCOUNTER — Ambulatory Visit
Admission: RE | Admit: 2017-08-01 | Discharge: 2017-08-01 | Disposition: A | Discharge status: Home / Self Care | Payer: Medicare HMO | Source: Ambulatory Visit | Attending: Pediatrics | Admitting: Pediatrics

## 2017-08-01 ENCOUNTER — Other Ambulatory Visit: Payer: Self-pay | Admitting: Urology

## 2017-08-01 DIAGNOSIS — N189 Chronic kidney disease, unspecified: Secondary | ICD-10-CM | POA: Diagnosis present

## 2017-08-01 DIAGNOSIS — I129 Hypertensive chronic kidney disease with stage 1 through stage 4 chronic kidney disease, or unspecified chronic kidney disease: Secondary | ICD-10-CM | POA: Diagnosis present

## 2017-08-01 DIAGNOSIS — Z1231 Encounter for screening mammogram for malignant neoplasm of breast: Secondary | ICD-10-CM | POA: Diagnosis not present

## 2017-08-01 DIAGNOSIS — Z833 Family history of diabetes mellitus: Secondary | ICD-10-CM | POA: Diagnosis not present

## 2017-08-01 DIAGNOSIS — R1084 Generalized abdominal pain: Secondary | ICD-10-CM | POA: Diagnosis not present

## 2017-08-01 DIAGNOSIS — Z90710 Acquired absence of both cervix and uterus: Secondary | ICD-10-CM

## 2017-08-01 DIAGNOSIS — E785 Hyperlipidemia, unspecified: Secondary | ICD-10-CM | POA: Diagnosis not present

## 2017-08-01 DIAGNOSIS — R509 Fever, unspecified: Secondary | ICD-10-CM | POA: Diagnosis not present

## 2017-08-01 DIAGNOSIS — Z8542 Personal history of malignant neoplasm of other parts of uterus: Secondary | ICD-10-CM | POA: Diagnosis not present

## 2017-08-01 DIAGNOSIS — R8279 Other abnormal findings on microbiological examination of urine: Secondary | ICD-10-CM | POA: Diagnosis not present

## 2017-08-01 DIAGNOSIS — Z96643 Presence of artificial hip joint, bilateral: Secondary | ICD-10-CM | POA: Diagnosis not present

## 2017-08-01 DIAGNOSIS — N136 Pyonephrosis: Principal | ICD-10-CM | POA: Diagnosis present

## 2017-08-01 DIAGNOSIS — R109 Unspecified abdominal pain: Secondary | ICD-10-CM | POA: Diagnosis not present

## 2017-08-01 DIAGNOSIS — R300 Dysuria: Secondary | ICD-10-CM | POA: Diagnosis not present

## 2017-08-01 DIAGNOSIS — N10 Acute tubulo-interstitial nephritis: Secondary | ICD-10-CM | POA: Diagnosis not present

## 2017-08-01 DIAGNOSIS — B9620 Unspecified Escherichia coli [E. coli] as the cause of diseases classified elsewhere: Secondary | ICD-10-CM | POA: Diagnosis not present

## 2017-08-01 DIAGNOSIS — Z87440 Personal history of urinary (tract) infections: Secondary | ICD-10-CM

## 2017-08-01 DIAGNOSIS — N12 Tubulo-interstitial nephritis, not specified as acute or chronic: Secondary | ICD-10-CM | POA: Diagnosis not present

## 2017-08-01 DIAGNOSIS — R000 Tachycardia, unspecified: Secondary | ICD-10-CM | POA: Diagnosis not present

## 2017-08-01 LAB — BASIC METABOLIC PANEL
Anion gap: 12 (ref 5–15)
BUN: 15 mg/dL (ref 6–20)
CO2: 25 mmol/L (ref 22–32)
CREATININE: 1.15 mg/dL — AB (ref 0.44–1.00)
Calcium: 9 mg/dL (ref 8.9–10.3)
Chloride: 98 mmol/L — ABNORMAL LOW (ref 101–111)
GFR calc Af Amer: 54 mL/min — ABNORMAL LOW (ref >60)
GFR calc non Af Amer: 46 mL/min — ABNORMAL LOW (ref >60)
GLUCOSE: 126 mg/dL — AB (ref 65–99)
Potassium: 4.2 mmol/L (ref 3.5–5.1)
Sodium: 135 mmol/L (ref 135–145)

## 2017-08-01 LAB — CBC
HCT: 42.9 % (ref 36.0–46.0)
Hemoglobin: 14.3 g/dL (ref 12.0–15.0)
MCH: 31.7 pg (ref 26.0–34.0)
MCHC: 33.3 g/dL (ref 30.0–36.0)
MCV: 95.1 fL (ref 78.0–100.0)
Platelets: 343 10*3/uL (ref 150–400)
RBC: 4.51 MIL/uL (ref 3.87–5.11)
RDW: 12.8 % (ref 11.5–15.5)
WBC: 23.4 10*3/uL — ABNORMAL HIGH (ref 4.0–10.5)

## 2017-08-01 SURGERY — CYSTOSCOPY, WITH RETROGRADE PYELOGRAM AND URETERAL STENT INSERTION
Anesthesia: General | Laterality: Left

## 2017-08-01 MED ORDER — SODIUM CHLORIDE 0.9 % IV SOLN
3.0000 g | INTRAVENOUS | Status: AC
  Administered 2017-08-01: 3 g via INTRAVENOUS
  Filled 2017-08-01: qty 3

## 2017-08-01 MED ORDER — FLUTICASONE PROPIONATE 50 MCG/ACT NA SUSP
2.0000 | Freq: Every day | NASAL | Status: DC
  Administered 2017-08-01 – 2017-08-03 (×3): 2 via NASAL
  Filled 2017-08-01: qty 16

## 2017-08-01 MED ORDER — PHENOL 1.4 % MT LIQD
1.0000 | OROMUCOSAL | Status: DC | PRN
  Filled 2017-08-01: qty 177

## 2017-08-01 MED ORDER — FLEET ENEMA 7-19 GM/118ML RE ENEM: 1.0000 | ENEMA | Freq: Every day | RECTAL | Status: DC | PRN

## 2017-08-01 MED ORDER — ZOLPIDEM TARTRATE 5 MG PO TABS: 5.0000 mg | ORAL_TABLET | Freq: Every evening | ORAL | Status: DC | PRN

## 2017-08-01 MED ORDER — AMITRIPTYLINE HCL 25 MG PO TABS
25.0000 mg | ORAL_TABLET | Freq: Every day | ORAL | Status: DC
  Administered 2017-08-01 – 2017-08-02 (×2): 25 mg via ORAL
  Filled 2017-08-01 (×2): qty 1

## 2017-08-01 MED ORDER — BELLADONNA ALKALOIDS-OPIUM 16.2-60 MG RE SUPP: 1.0000 | Freq: Four times a day (QID) | RECTAL | Status: DC | PRN

## 2017-08-01 MED ORDER — SODIUM CHLORIDE 0.9 % IV SOLN
3.0000 g | Freq: Four times a day (QID) | INTRAVENOUS | Status: DC
  Administered 2017-08-02 (×2): 3 g via INTRAVENOUS
  Filled 2017-08-01 (×3): qty 3

## 2017-08-01 MED ORDER — ACETAMINOPHEN 325 MG PO TABS
650.0000 mg | ORAL_TABLET | ORAL | Status: DC | PRN
  Administered 2017-08-02: 650 mg via ORAL
  Filled 2017-08-01: qty 2

## 2017-08-01 MED ORDER — MENTHOL 3 MG MT LOZG
1.0000 | LOZENGE | OROMUCOSAL | Status: DC | PRN
  Filled 2017-08-01: qty 9

## 2017-08-01 MED ORDER — LACTATED RINGERS IV SOLN
INTRAVENOUS | Status: DC
  Administered 2017-08-01: 17:00:00 via INTRAVENOUS

## 2017-08-01 MED ORDER — GABAPENTIN 300 MG PO CAPS
600.0000 mg | ORAL_CAPSULE | Freq: Every day | ORAL | Status: DC
  Administered 2017-08-01 – 2017-08-02 (×2): 600 mg via ORAL
  Filled 2017-08-01 (×2): qty 2

## 2017-08-01 MED ORDER — KCL IN DEXTROSE-NACL 20-5-0.45 MEQ/L-%-% IV SOLN
INTRAVENOUS | Status: DC
  Administered 2017-08-01 – 2017-08-03 (×4): via INTRAVENOUS
  Filled 2017-08-01 (×4): qty 1000

## 2017-08-01 MED ORDER — LISINOPRIL 10 MG PO TABS
10.0000 mg | ORAL_TABLET | Freq: Every day | ORAL | Status: DC
  Administered 2017-08-01 – 2017-08-03 (×3): 10 mg via ORAL
  Filled 2017-08-01 (×3): qty 1

## 2017-08-01 MED ORDER — LORATADINE 10 MG PO TABS
10.0000 mg | ORAL_TABLET | Freq: Every day | ORAL | Status: DC
  Administered 2017-08-01 – 2017-08-03 (×3): 10 mg via ORAL
  Filled 2017-08-01 (×3): qty 1

## 2017-08-01 MED ORDER — LISINOPRIL-HYDROCHLOROTHIAZIDE 10-12.5 MG PO TABS: 1.0000 | ORAL_TABLET | Freq: Every day | ORAL | Status: DC

## 2017-08-01 MED ORDER — BISACODYL 10 MG RE SUPP: 10.0000 mg | Freq: Every day | RECTAL | Status: DC | PRN

## 2017-08-01 MED ORDER — OXYCODONE-ACETAMINOPHEN 5-325 MG PO TABS
1.0000 | ORAL_TABLET | Freq: Four times a day (QID) | ORAL | Status: DC | PRN
Start: 2017-08-01 — End: 2017-08-03
  Administered 2017-08-01 – 2017-08-02 (×2): 1 via ORAL
  Filled 2017-08-01 (×2): qty 1

## 2017-08-01 MED ORDER — HYDROCHLOROTHIAZIDE 12.5 MG PO CAPS
12.5000 mg | ORAL_CAPSULE | Freq: Every day | ORAL | Status: DC
  Administered 2017-08-01 – 2017-08-03 (×3): 12.5 mg via ORAL
  Filled 2017-08-01 (×3): qty 1

## 2017-08-01 MED ORDER — POTASSIUM CHLORIDE 2 MEQ/ML IV SOLN: INTRAVENOUS | Status: DC

## 2017-08-01 NOTE — Progress Notes (Signed)
Pharmacy Antibiotic Note  [name] is a 72 y.o. female admitted on 08/01/2017 with pyelonephritis.  Pharmacy has been consulted for Unasyn dosing.  Plan: Unasyn 3g IV q6h Follow up renal fxn, culture results, and clinical course.   Height: 5' (152.4 cm) Weight: 180 lb (81.6 kg) IBW/kg (Calculated) : 45.5  Temp (24hrs), Avg:99.4 F (37.4 C), Min:99.4 F (37.4 C), Max:99.4 F (37.4 C)  No results for input(s): WBC, CREATININE, LATICACIDVEN, VANCOTROUGH, VANCOPEAK, VANCORANDOM, GENTTROUGH, GENTPEAK, GENTRANDOM, TOBRATROUGH, TOBRAPEAK, TOBRARND, AMIKACINPEAK, AMIKACINTROU, AMIKACIN in the last 168 hours.  CrCl cannot be calculated (Patient's most recent lab result is older than the maximum 21 days allowed.).    Allergies  Allergen Reactions  . Cashew Nut Oil Hives  . Sulfamethoxazole     Mouth sores  . Zofran Hives and Nausea Only    Antimicrobials this admission: 3/26 Unasyn >>    Thank you for allowing pharmacy to be a part of this patient's care. Gretta Arab PharmD, BCPS Pager [phone] 08/01/2017 4:41 PM

## 2017-08-01 NOTE — H&P (Signed)
History of present illness: 72 year old female admitted to the hospital from clinic today with concern for pyelonephritis.    The patient has a history of ureteral stricture following a anterior approach lumbar decompression surgery in 2009.  She has had numerous attempts of minimally invasive management including balloon dilations and stent placements.  In 2010 she was determined to have retroperitoneal fibrosis as the etiology of her ureteral stricture and she subsequently underwent an open ureteral lysis procedure with an omental wrap.  n March 2015 she underwent a renogram which demonstrated diminished function of left kidney but a patent and draining system with perhaps minimal obstruction.   Since the time of her initial back surgery she has had several numerous episodes of pyelonephritis.  I In the past, pyelonephritis has been managed with antibiotics successfully avoiding the need for ureteral stent placement.   The patient presented to clinic earlier today with tachycardia, worsening flank pain, low-grade fever, and a urine consistent with a severe urinary tract infection.  The patient states that she has been treated for urinary tract infections twice in the past 2-1/2 months.  There is a culture in the hospital EMR from 07/17/17 that is nonspecific demonstrating multifocal or for which she was treated with Augmentin.  All the patient's culture data over the past several years has grown Escherichia coli with resistance to cephalosporins.  Review of systems: A 12 point comprehensive review of systems was obtained and is negative unless otherwise stated in the history of present illness.  Patient Active Problem List   Diagnosis Date Noted  . Pyelonephritis 08/01/2017  . Class 2 severe obesity with serious comorbidity and body mass index (BMI) of 35.0 to 35.9 in adult ([place]) 06/21/2017  . History of hydronephrosis   . Low back pain   . Abdominal pain 04/26/2016  . Left flank pain   .  Intractable abdominal pain 04/25/2016  . Recurrent UTI 02/20/2016  . Peripheral neuropathy 02/08/2016  . Depression 02/08/2016  . OA (osteoarthritis) of hip 07/04/2014  . 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 Pseudoarthrosis L3- L5 12/17/2012  . Nocturnal leg cramps 11/13/2012  . HLD (hyperlipidemia) 08/10/2012  . Recurrent ventral incisional hernia s/p lap repair 04/23/2013 04/27/2012  . Obesity (BMI 30-39.9) 04/27/2012  . Chronic pain 04/27/2012  . Hypertension   . History of endometrial cancer s/p TAH BSO June2013 06/15/2011    No current facility-administered medications on file prior to encounter.    Current Outpatient Medications on File Prior to Encounter  Medication Sig Dispense Refill  . amitriptyline (ELAVIL) 25 MG tablet TAKE 1 TABLET BY MOUTH ONCE DAILY AT BEDTIME 90 tablet 0  . fexofenadine (ALLEGRA ALLERGY) 180 MG tablet Take 1 tablet (180 mg total) by mouth daily. 30 tablet 11  . fluticasone (FLONASE) 50 MCG/ACT nasal spray Place 2 sprays into both nostrils daily.    Marland Kitchen gabapentin (NEURONTIN) 300 MG capsule Take 2 capsules (600 mg total) by mouth at bedtime. 180 capsule 3  . lisinopril-hydrochlorothiazide (PRINZIDE,ZESTORETIC) 10-12.5 MG tablet Take 1 tablet by mouth daily. 90 tablet 3  . oxyCODONE-acetaminophen (PERCOCET/ROXICET) 5-325 MG tablet Take by mouth every 6 (six) hours as needed for severe pain.    . sodium phosphate (FLEET) 7-19 GM/118ML ENEM Place 133 mLs (1 enema total) rectally daily as needed for severe constipation. 230 mL 1    Past Medical History:  Diagnosis Date  . Arthritis   . Colon polyp, hyperplastic 04/27/2012   Colonoscopy 2010.  No adenomatous polyps   . Endometrioid adenocarcinoma   . History of endometrial cancer s/p TAH BSO June2013 06/15/2011   Grade 1 endometrioid adenocarcinoma with focal superficial myometrial invasion of 0.1 cm with a myometrium of 1.8 cm or approximately 7%. She had a  5-cm tumor. No lymphovascular space Involvement.    . Hyperlipidemia   . Hypertension   . Mass of in fold of jejunum s/p SB resection 07/19/2012 07/20/2012  . Obesity (BMI 30-39.9) 04/27/2012  . Pyelonephritis 02/2016  . 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 SBO (small bowel obstruction) - recurrent 04/27/2012  . Ureteral adhesion, left 2010   s/p ureterolysis & stenting Dr. Larwance Sachs now  . UTI (urinary tract infection)     Past Surgical History:  Procedure Laterality Date  . ABDOMINAL HYSTERECTOMY  11/02/10   TAH, BSO, lysis of adhesions for endometrial cancer  . [place], 2009   anterior L2-3, L3-4 arthrodesis  . BACK SURGERY  12/17/2012   Spinal fusion and repair   . BLADDER SUSPENSION  2003  . CATARACT EXTRACTION, BILATERAL  2003  . EYE SURGERY    . FOOT SURGERY  06/2010   left foot surgery  . HERNIA REPAIR  07/19/12   lap [place] repair  . INCISIONAL HERNIA REPAIR  11/02/10  . INSERTION OF MESH N/A 04/23/2013   Procedure: INSERTION OF MESH;  Surgeon: Adin Hector, MD;  Location: [facility];  Service: General;  Laterality: N/A;  . JOINT REPLACEMENT  09/2009   Right total knee  . KIDNEY SURGERY  2010   Left kidney  . LAPAROSCOPIC LYSIS OF ADHESIONS N/A 04/23/2013   Procedure: LAPAROSCOPIC LYSIS OF ADHESIONS;  Surgeon: Adin Hector, MD;  Location: [facility];  Service: General;  Laterality: N/A;  . TOTAL HIP ARTHROPLASTY  01/2010   Right total hip  . TOTAL HIP ARTHROPLASTY Left 07/04/2014   Procedure: LEFT TOTAL HIP ARTHROPLASTY ANTERIOR APPROACH;  Surgeon: Gearlean Alf, MD;  Location: [facility];  Service: Orthopedics;  Laterality: Left;  . TOTAL KNEE ARTHROPLASTY     right  . URETEROLYSIS  2010   lap/open left ureterolysis & omental flap, ureter stent  . VENTRAL HERNIA REPAIR N/A 07/19/2012   Procedure: LAPAROSCOPIC LYSIS OF ADHESIONS, SMALL BOWEL RESECTION, SEROSAL REPAIR, PRIMARY VENTRAL HERNIA REPAIR;   Surgeon: Adin Hector, MD;  Location: [facility];  Service: General;  Laterality: N/A;  . VENTRAL HERNIA REPAIR N/A 04/23/2013   Procedure: LAPAROSCOPIC exploration and repair of hernia in abdominal VENTRAL wall  HERNIA;  Surgeon: Adin Hector, MD;  Location: [facility];  Service: General;  Laterality: N/A;    Social History   Tobacco Use  . Smoking status: Never Smoker  . Smokeless tobacco: Never Used  Substance Use Topics  . Alcohol use: No  . Drug use: No    Family History  Problem Relation Age of Onset  . Diabetes Mother   . Cancer Mother        lung  . Diabetes Father   . Cancer Father        liver  . Colon cancer Sister   . Diabetes Sister   . Cancer Sister        colon  . Breast cancer Neg Hx   . Allergic rhinitis Neg Hx   . Angioedema Neg Hx   . Asthma Neg Hx   . Atopy Neg Hx   . Eczema Neg Hx   .  Immunodeficiency Neg Hx   . Urticaria Neg Hx     PE: Vitals:   08/01/17 1500  BP: 119/71  Pulse: (!) 104  Resp: 18  Temp: 99.4 F (37.4 C)  TempSrc: Oral  SpO2: 99%  Weight: 81.6 kg (180 lb)  Height: 5' (1.524 m)   Patient appears to be in no acute distress  patient is alert and oriented x3 Atraumatic normocephalic head No cervical or supraclavicular lymphadenopathy appreciated No increased work of breathing, no audible wheezes/rhonchi Regular sinus rhythm/rate Abdomen is soft, nontender, nondistended Left CVA tenderness and left lower abdominal tenderness Lower extremities are symmetric without appreciable edema Grossly neurologically intact No identifiable skin lesions  No results for input(s): WBC, HGB, HCT in the last 72 hours. No results for input(s): NA, K, CL, CO2, GLUCOSE, BUN, CREATININE, CALCIUM in the last 72 hours. No results for input(s): LABPT, INR in the last 72 hours. No results for input(s): LABURIN in the last 72 hours. Results for orders placed or performed in visit on 07/17/17  Urine Culture     Status: None   Collection Time:  07/17/17 11:09 AM  Result Value Ref Range Status   Urine Culture, Routine Final report  Final   Organism ID, Bacteria Comment  Final    Comment: Mixed urogenital flora 10,000-25,000 colony forming units per mL     Imaging: He patient has a CT scan noncontrast, that was performed  In the urology clinic today which I have independently reviewed and demonstrates moderate to severe hydronephrosis to the proximal ureter with severe  Periureteral stranding.  The distal ureter was lost due to beam hardening from her hip replacements bilaterally making it difficult to assess the distal ureter.  Imp:  The patient has a chronic UPJ obstruction that was repaired in 2011 and that may be partially obstructed, but does drain based on a renogram performed in 2015.  She is admitted with pyelonephritis. We have opted to manage this medically in attempt to avoid placing a stent in the left collecting system.  We will make her nothing by mouth past midnight to ensure that she does not decompensate necessitating stent placement tomorrow.  Plan: I chosen to place the patient on Unasyn based on prior cultures.  A culture is pending in the [facility] office. This can be easily transitioned to Augmentin  once the patient is ready to be discharged home.  I resumed all the patient's home medications.  The patient is on IV maintenance fluid.  She is nothing by mouth past midnight.  Louis Meckel W

## 2017-08-01 NOTE — Progress Notes (Signed)
Called report to [facility] on [address]. Patient transported to [id number].

## 2017-08-02 LAB — BASIC METABOLIC PANEL
Anion gap: 11 (ref 5–15)
BUN: 20 mg/dL (ref 6–20)
CALCIUM: 8.5 mg/dL — AB (ref 8.9–10.3)
CHLORIDE: 96 mmol/L — AB (ref 101–111)
CO2: 25 mmol/L (ref 22–32)
CREATININE: 1.32 mg/dL — AB (ref 0.44–1.00)
GFR calc Af Amer: 45 mL/min — ABNORMAL LOW (ref >60)
GFR calc non Af Amer: 39 mL/min — ABNORMAL LOW (ref >60)
Glucose, Bld: 127 mg/dL — ABNORMAL HIGH (ref 65–99)
Potassium: 3.9 mmol/L (ref 3.5–5.1)
Sodium: 132 mmol/L — ABNORMAL LOW (ref 135–145)

## 2017-08-02 LAB — CBC
HCT: 40.8 % (ref 36.0–46.0)
Hemoglobin: 13.3 g/dL (ref 12.0–15.0)
MCH: 31.2 pg (ref 26.0–34.0)
MCHC: 32.6 g/dL (ref 30.0–36.0)
MCV: 95.8 fL (ref 78.0–100.0)
PLATELETS: 283 10*3/uL (ref 150–400)
RBC: 4.26 MIL/uL (ref 3.87–5.11)
RDW: 13.1 % (ref 11.5–15.5)
WBC: 25.1 10*3/uL — ABNORMAL HIGH (ref 4.0–10.5)

## 2017-08-02 MED ORDER — PIPERACILLIN-TAZOBACTAM 3.375 G IVPB 30 MIN: 3.3750 g | Freq: Three times a day (TID) | INTRAVENOUS | Status: DC

## 2017-08-02 MED ORDER — PIPERACILLIN-TAZOBACTAM 3.375 G IVPB
3.3750 g | Freq: Three times a day (TID) | INTRAVENOUS | Status: DC
  Administered 2017-08-02 – 2017-08-03 (×5): 3.375 g via INTRAVENOUS
  Filled 2017-08-02 (×5): qty 50

## 2017-08-02 NOTE — Progress Notes (Signed)
Patient ID: [name], female   DOB: 1945-09-23, 72 y.o.   MRN: [id number]  1 Day Post-Op Subjective: Pt admitted yesterday for left pyelonephritis and chronic left hydronephrosis.  Feeling better today.  Fever improved.  Objective: Vital signs in last 24 hours: Temp:  [99 F (37.2 C)-99.9 F (37.7 C)] 99 F (37.2 C) (03/27 0619) Pulse Rate:  [79-104] 79 (03/27 0619) Resp:  [18] 18 (03/27 0619) BP: (113-122)/(49-71) 113/56 (03/27 0619) SpO2:  [95 %-99 %] 97 % (03/27 0619) Weight:  [81.6 kg (179 lb 14.3 oz)-81.6 kg (180 lb)] 81.6 kg (179 lb 14.3 oz) (03/26 1731)  Intake/Output from previous day: 03/26 0701 - 03/27 0700 In: 1138.3 [I.V.:938.3; IV Piggyback:200] Out: -  Intake/Output this shift: No intake/output data recorded.  Physical Exam:  General: Alert and oriented CV: RRR Lungs: Clear Abdomen: Soft, ND, minimal left CVAT Ext: NT, No erythema  Lab Results: Recent Labs    08/01/17 1632 08/02/17 0542  HGB 14.3 13.3  HCT 42.9 40.8   Lab Results  Component Value Date   WBC 25.1 (H) 08/02/2017   HGB 13.3 08/02/2017   HCT 40.8 08/02/2017   MCV 95.8 08/02/2017   PLT 283 08/02/2017     BMET Recent Labs    08/01/17 1632 08/02/17 0542  NA 135 132*  K 4.2 3.9  CL 98* 96*  CO2 25 25  GLUCOSE 126* 127*  BUN 15 20  CREATININE 1.15* 1.32*  CALCIUM 9.0 8.5*     Studies/Results: No results found.  Assessment/Plan: Left pyelonephritis with chronic left hydronephrosis - Pt has chronic non-obstructive left hydronephrosis.  Will continue IV antibiotics but will change to Zosyn since WBC increasing.  Cultures from office on 3/26 pending. Will check blood cultures. - If she does not improve over next 24-48 hrs, will proceed with left ureteral stent   LOS: 1 day   Johan Creveling,LES 08/02/2017, 7:19 AM

## 2017-08-03 LAB — BASIC METABOLIC PANEL
Anion gap: 9 (ref 5–15)
BUN: 12 mg/dL (ref 6–20)
CALCIUM: 8.6 mg/dL — AB (ref 8.9–10.3)
CHLORIDE: 104 mmol/L (ref 101–111)
CO2: 25 mmol/L (ref 22–32)
Creatinine, Ser: 1.05 mg/dL — ABNORMAL HIGH (ref 0.44–1.00)
GFR, EST AFRICAN AMERICAN: 60 mL/min — AB (ref >60)
GFR, EST NON AFRICAN AMERICAN: 52 mL/min — AB (ref >60)
Glucose, Bld: 177 mg/dL — ABNORMAL HIGH (ref 65–99)
Potassium: 4.3 mmol/L (ref 3.5–5.1)
SODIUM: 138 mmol/L (ref 135–145)

## 2017-08-03 LAB — CBC
HCT: 38.2 % (ref 36.0–46.0)
Hemoglobin: 12.7 g/dL (ref 12.0–15.0)
MCH: 31.6 pg (ref 26.0–34.0)
MCHC: 33.2 g/dL (ref 30.0–36.0)
MCV: 95 fL (ref 78.0–100.0)
PLATELETS: 295 10*3/uL (ref 150–400)
RBC: 4.02 MIL/uL (ref 3.87–5.11)
RDW: 12.9 % (ref 11.5–15.5)
WBC: 13.1 10*3/uL — ABNORMAL HIGH (ref 4.0–10.5)

## 2017-08-03 MED ORDER — AMOXICILLIN-POT CLAVULANATE 875-125 MG PO TABS: 1.0000 | ORAL_TABLET | Freq: Two times a day (BID) | ORAL | 0 refills | Status: DC

## 2017-08-03 NOTE — Discharge Summary (Signed)
  Date of admission: 08/01/2017  Date of discharge: 08/03/2017  Admission diagnosis: Left pyelonephritis  Discharge diagnosis: Left pyeonephritis  Secondary diagnoses: Hypertension, chronic kidney disease  History and Physical/hospital course: For full details, please see admission history and physical. Briefly, [name] is a 72 y.o. year old patient with chronic left hydronephrosis s/p UPJ repair for obstruction due to fibrosis related to prior vascular surgery. She has had chronic hydronephrosis that has not been felt to be completely obstructed based on prior renography.  She has had recurrent urinary tract infections.  She presented recently with a clinical picture consistent with left-sided pyelonephritis including left-sided flank pain, fever, and evidence of urinary tract infection.  She was admitted to the hospital on IV antibiotics as a precaution considering her left hydronephrosis and fever.  Due to the chronicity of her left hydronephrosis, it was felt that she would not require ureteral stent placement unless she did not respond appropriately to antibiotics.  She was initially placed on IV Unasyn.  Her white blood count did not initially decrease and she was therefore switched to IV Zosyn.  She did clinically improve with a decrease in her white blood count and a decrease in her fever curve.  Her urine culture from the office subsequently grew out E. coli sensitive to Augmentin and she was able to be discharged home on the evening of 08/03/17 on oral therapy.   Laboratory values:  Recent Labs    08/01/17 1632 08/02/17 0542 08/03/17 0513  HGB 14.3 13.3 12.7  HCT 42.9 40.8 38.2   Recent Labs    08/02/17 0542 08/03/17 0513  CREATININE 1.32* 1.05*    Disposition: Home  Discharge instruction: She may resume normal activities and her normal diet.  Discharge medications:  Allergies as of 08/03/2017      Reactions   Cashew Nut Oil Hives   Sulfamethoxazole    Mouth  sores   Zofran Hives, Nausea Only      Medication List    TAKE these medications   amitriptyline 25 MG tablet Commonly known as:  ELAVIL TAKE 1 TABLET BY MOUTH ONCE DAILY AT BEDTIME   amoxicillin-clavulanate 875-125 MG tablet Commonly known as:  AUGMENTIN Take 1 tablet by mouth 2 (two) times daily.   EX-LAX PO Take 2-4 tablets by mouth daily as needed (CONSTIPATION).   fexofenadine 180 MG tablet Commonly known as:  ALLEGRA ALLERGY Take 1 tablet (180 mg total) by mouth daily.   fluticasone 50 MCG/ACT nasal spray Commonly known as:  FLONASE Place 2 sprays into both nostrils daily.   gabapentin 300 MG capsule Commonly known as:  NEURONTIN Take 2 capsules (600 mg total) by mouth at bedtime.   lisinopril-hydrochlorothiazide 10-12.5 MG tablet Commonly known as:  PRINZIDE,ZESTORETIC Take 1 tablet by mouth daily.   methocarbamol 500 MG tablet Commonly known as:  ROBAXIN Take 500 mg by mouth 4 (four) times daily.   oxyCODONE-acetaminophen 5-325 MG tablet Commonly known as:  PERCOCET/ROXICET Take by mouth every 6 (six) hours as needed for severe pain.   sodium phosphate 7-19 GM/118ML Enem Place 133 mLs (1 enema total) rectally daily as needed for severe constipation.       Followup:  Follow-up Information    Raynelle Bring, MD Follow up.   Specialty:  Urology Why:  Office will call to schedule follow up. Contact information: [place] [address] [phone]

## 2017-08-03 NOTE — Progress Notes (Signed)
Pt to be discharged to home this evening. Pt given discharge instructions including medications and schedules. Pt verbalized understanding of all discharge instructions. Discharge packet with prescription with Pt at time of discharge

## 2017-08-03 NOTE — Discharge Instructions (Signed)
Please call if fever > 101. °

## 2017-08-03 NOTE — Progress Notes (Signed)
Patient ID: [name], female   DOB: 12/27/1945, 72 y.o.   MRN: [id number]  Culture returned today.  E coli sensitive to Augmentin.  Will change to oral antibiotics and discharge home.  She continues to be clinically improved.  Will keep on therapy for total of 2 weeks and will arrange outpatient follow up in 4-6 weeks.

## 2017-08-03 NOTE — Progress Notes (Signed)
Patient ID: [name], female   DOB: 20-Jun-1945, 72 y.o.   MRN: [id number]  2 Days Post-Op Subjective: Pt continues to feel improved.  Minimal left abdominal pain.  Objective: Vital signs in last 24 hours: Temp:  [98.2 F (36.8 C)-100.4 F (38 C)] 98.6 F (37 C) (03/28 0500) Pulse Rate:  [75-99] 99 (03/28 0500) Resp:  [18] 18 (03/28 0500) BP: (122-137)/(49-59) 133/49 (03/28 0500) SpO2:  [94 %-99 %] 99 % (03/28 0500)  Intake/Output from previous day: 03/27 0701 - 03/28 0700 In: 3098.3 [P.O.:480; I.V.:2418.3; IV Piggyback:200] Out: 2100 [Urine:2100] Intake/Output this shift: No intake/output data recorded.  Physical Exam:  General: Alert and oriented CV: RRR Lungs: Clear Abdomen: Soft, ND Incisions: Ext: NT, No erythema  Lab Results: Recent Labs    08/01/17 1632 08/02/17 0542 08/03/17 0513  HGB 14.3 13.3 12.7  HCT 42.9 40.8 38.2   CBC Latest Ref Rng & Units 08/03/2017 08/02/2017 08/01/2017  WBC 4.0 - 10.5 K/uL 13.1(H) 25.1(H) 23.4(H)  Hemoglobin 12.0 - 15.0 g/dL 12.7 13.3 14.3  Hematocrit 36.0 - 46.0 % 38.2 40.8 42.9  Platelets 150 - 400 K/uL 295 283 343      BMET Recent Labs    08/02/17 0542 08/03/17 0513  NA 132* 138  K 3.9 4.3  CL 96* 104  CO2 25 25  GLUCOSE 127* 177*  BUN 20 12  CREATININE 1.32* 1.05*  CALCIUM 8.5* 8.6*     Studies/Results:   Assessment/Plan: Left pyelonephritis with left hydronephrosis:  WBC improving and c/w clinical improvement on Zosyn.  Continue IV antibiotics pending office urine culture and hospital blood culture.  No indication for ureteral stent currently.     LOS: 2 days   Lamarkus Nebel,LES 08/03/2017, 7:19 AM

## 2017-08-07 LAB — CULTURE, BLOOD (ROUTINE X 2)
CULTURE: NO GROWTH
SPECIAL REQUESTS: ADEQUATE

## 2017-08-08 DIAGNOSIS — M7918 Myalgia, other site: Secondary | ICD-10-CM | POA: Diagnosis not present

## 2017-08-08 DIAGNOSIS — M961 Postlaminectomy syndrome, not elsewhere classified: Secondary | ICD-10-CM | POA: Diagnosis not present

## 2017-08-08 DIAGNOSIS — M461 Sacroiliitis, not elsewhere classified: Secondary | ICD-10-CM | POA: Diagnosis not present

## 2017-08-08 DIAGNOSIS — Z79899 Other long term (current) drug therapy: Secondary | ICD-10-CM | POA: Diagnosis not present

## 2017-08-08 DIAGNOSIS — M47816 Spondylosis without myelopathy or radiculopathy, lumbar region: Secondary | ICD-10-CM | POA: Diagnosis not present

## 2017-08-08 DIAGNOSIS — R69 Illness, unspecified: Secondary | ICD-10-CM | POA: Diagnosis not present

## 2017-08-22 ENCOUNTER — Ambulatory Visit (INDEPENDENT_AMBULATORY_CARE_PROVIDER_SITE_OTHER): Payer: Medicare HMO | Admitting: Physician Assistant

## 2017-08-22 ENCOUNTER — Encounter: Payer: Self-pay | Admitting: Physician Assistant

## 2017-08-22 VITALS — BP 124/73 | HR 97 | Temp 97.4°F | Ht 60.0 in | Wt 184.8 lb

## 2017-08-22 DIAGNOSIS — R300 Dysuria: Secondary | ICD-10-CM | POA: Diagnosis not present

## 2017-08-22 DIAGNOSIS — N3001 Acute cystitis with hematuria: Secondary | ICD-10-CM | POA: Diagnosis not present

## 2017-08-22 LAB — URINALYSIS, COMPLETE
Bilirubin, UA: NEGATIVE
Glucose, UA: NEGATIVE
Ketones, UA: NEGATIVE
Nitrite, UA: NEGATIVE
Specific Gravity, UA: 1.025 (ref 1.005–1.030)
Urobilinogen, Ur: 0.2 mg/dL (ref 0.2–1.0)
pH, UA: 6 (ref 5.0–7.5)

## 2017-08-22 LAB — MICROSCOPIC EXAMINATION: WBC, UA: >30 /hpf — AB (ref 0–5)

## 2017-08-22 MED ORDER — NITROFURANTOIN MONOHYD MACRO 100 MG PO CAPS: 100.0000 mg | ORAL_CAPSULE | Freq: Two times a day (BID) | ORAL | 0 refills | Status: DC

## 2017-08-23 NOTE — Progress Notes (Signed)
BP 124/73   Pulse 97   Temp (!) 97.4 F (36.3 C) (Oral)   Ht 5' (1.524 m)   Wt 184 lb 12.8 oz (83.8 kg)   BMI 36.09 kg/m    Subjective:    Patient ID: [name], female    DOB: 1945-08-12, 72 y.o.   MRN: [id number]  HPI: [name] is a 72 y.o. female presenting on 08/22/2017 for Urinary Tract Infection  Patient comes in with dysuria.  She has frequent urinary tract infections.  Years ago she had an injury to her back and kidney.  Because of this she frequently gets urinary tract infections and has even been hospitalized up to 3 or 4 times in a year.  She will see her urologist in a couple of more weeks.  This is gone on for just a couple of days.  She denies any fever or chills at this time.  Past Medical History:  Diagnosis Date  . Arthritis   . Colon polyp, hyperplastic 04/27/2012   Colonoscopy 2010.  No adenomatous polyps   . Endometrioid adenocarcinoma   . History of endometrial cancer s/p TAH BSO June2013 06/15/2011   Grade 1 endometrioid adenocarcinoma with focal superficial myometrial invasion of 0.1 cm with a myometrium of 1.8 cm or approximately 7%. She had a 5-cm tumor. No lymphovascular space Involvement.    . Hyperlipidemia   . Hypertension   . Mass of in fold of jejunum s/p SB resection 07/19/2012 07/20/2012  . Obesity (BMI 30-39.9) 04/27/2012  . Pyelonephritis 02/2016  . 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 SBO (small bowel obstruction) - recurrent 04/27/2012  . Ureteral adhesion, left 2010   s/p ureterolysis & stenting Dr. Larwance Sachs now  . UTI (urinary tract infection)    Relevant past medical, surgical, family and social history reviewed and updated as indicated. Interim medical history since our last visit reviewed. Allergies and medications reviewed and updated. DATA REVIEWED: CHART IN EPIC  Family History reviewed for pertinent findings.  Review of Systems  Constitutional:  Negative.   HENT: Negative.   Eyes: Negative.   Respiratory: Negative.   Gastrointestinal: Negative.   Genitourinary: Positive for difficulty urinating, dysuria and urgency. Negative for flank pain.    Allergies as of 08/22/2017      Reactions   Cashew Nut Oil Hives   Sulfamethoxazole    Mouth sores   Zofran Hives, Nausea Only      Medication List        Accurate as of 08/22/17 11:59 PM. Always use your most recent med list.          amitriptyline 25 MG tablet Commonly known as:  ELAVIL TAKE 1 TABLET BY MOUTH ONCE DAILY AT BEDTIME   EX-LAX PO Take 2-4 tablets by mouth daily as needed (CONSTIPATION).   fexofenadine 180 MG tablet Commonly known as:  ALLEGRA ALLERGY Take 1 tablet (180 mg total) by mouth daily.   fluticasone 50 MCG/ACT nasal spray Commonly known as:  FLONASE Place 2 sprays into both nostrils daily.   gabapentin 300 MG capsule Commonly known as:  NEURONTIN Take 2 capsules (600 mg total) by mouth at bedtime.   lisinopril-hydrochlorothiazide 10-12.5 MG tablet Commonly known as:  PRINZIDE,ZESTORETIC Take 1 tablet by mouth daily.   methocarbamol 500 MG tablet Commonly known as:  ROBAXIN Take 500 mg by mouth 4 (four) times daily.   nitrofurantoin (macrocrystal-monohydrate) 100 MG capsule Commonly known as:  MACROBID Take 1 capsule (100 mg total) by mouth 2 (two) times daily. Then stay on one daily   oxyCODONE-acetaminophen 5-325 MG tablet Commonly known as:  PERCOCET/ROXICET Take by mouth every 6 (six) hours as needed for severe pain.   sodium phosphate 7-19 GM/118ML Enem Place 133 mLs (1 enema total) rectally daily as needed for severe constipation.          Objective:    BP 124/73   Pulse 97   Temp (!) 97.4 F (36.3 C) (Oral)   Ht 5' (1.524 m)   Wt 184 lb 12.8 oz (83.8 kg)   BMI 36.09 kg/m   Allergies  Allergen Reactions  . Cashew Nut Oil Hives  . Sulfamethoxazole     Mouth sores  . Zofran Hives and Nausea Only    Wt Readings  from Last 3 Encounters:  08/22/17 184 lb 12.8 oz (83.8 kg)  08/01/17 179 lb 14.3 oz (81.6 kg)  07/31/17 180 lb (81.6 kg)    Physical Exam  Constitutional: She is oriented to person, place, and time. She appears well-developed and well-nourished.  HENT:  Head: Normocephalic and atraumatic.  Eyes: Pupils are equal, round, and reactive to light. Conjunctivae are normal.  Cardiovascular: Normal rate, regular rhythm, normal heart sounds and intact distal pulses.  Pulmonary/Chest: Effort normal and breath sounds normal.  Abdominal: Soft. Bowel sounds are normal. She exhibits no distension and no mass. There is tenderness in the suprapubic area. There is no rebound, no guarding and no CVA tenderness.  Neurological: She is alert and oriented to person, place, and time. She has normal reflexes.  Skin: Skin is warm and dry. No rash noted.  Psychiatric: She has a normal mood and affect. Her behavior is normal. Judgment and thought content normal.    Results for orders placed or performed in visit on 08/22/17  Microscopic Examination  Result Value Ref Range   WBC, UA >30 (A) 0 - 5 /hpf   RBC, UA 3-10 (A) 0 - 2 /hpf   Epithelial Cells (non renal) 0-10 0 - 10 /hpf   Mucus, UA Present (A) Not Estab.   Bacteria, UA Many (A) None seen/Few  Urinalysis, Complete  Result Value Ref Range   Specific Gravity, UA 1.025 1.005 - 1.030   pH, UA 6.0 5.0 - 7.5   Color, UA Yellow Yellow   Appearance Ur Turbid (A) Clear   Leukocytes, UA 3+ (A) Negative   Protein, UA 3+ (A) Negative/Trace   Glucose, UA Negative Negative   Ketones, UA Negative Negative   RBC, UA 3+ (A) Negative   Bilirubin, UA Negative Negative   Urobilinogen, Ur 0.2 0.2 - 1.0 mg/dL   Nitrite, UA Negative Negative   Microscopic Examination See below:       Assessment & Plan:   1. Dysuria - Urine Culture - Urinalysis, Complete - Microscopic Examination  2. Acute cystitis with hematuria - nitrofurantoin, macrocrystal-monohydrate,  (MACROBID) 100 MG capsule; Take 1 capsule (100 mg total) by mouth 2 (two) times daily. Then stay on one daily  Dispense: 40 capsule; Refill: 0   Continue all other maintenance medications as listed above.  Follow up plan: No follow-ups on file.  Educational handout given for Jay PA-C [facility] [address]  [place], [place] [address] [phone]   08/23/2017, 10:22 AM

## 2017-08-24 LAB — URINE CULTURE

## 2017-09-04 DIAGNOSIS — N135 Crossing vessel and stricture of ureter without hydronephrosis: Secondary | ICD-10-CM | POA: Diagnosis not present

## 2017-09-18 ENCOUNTER — Other Ambulatory Visit: Payer: Self-pay | Admitting: Pediatrics

## 2017-09-18 DIAGNOSIS — G8929 Other chronic pain: Secondary | ICD-10-CM

## 2017-09-21 ENCOUNTER — Ambulatory Visit (INDEPENDENT_AMBULATORY_CARE_PROVIDER_SITE_OTHER): Payer: Medicare HMO | Admitting: Pediatrics

## 2017-09-21 ENCOUNTER — Encounter: Payer: Self-pay | Admitting: Pediatrics

## 2017-09-21 VITALS — BP 127/68 | HR 62 | Temp 97.9°F | Ht 60.0 in | Wt 178.6 lb

## 2017-09-21 DIAGNOSIS — N135 Crossing vessel and stricture of ureter without hydronephrosis: Secondary | ICD-10-CM

## 2017-09-21 DIAGNOSIS — G8929 Other chronic pain: Secondary | ICD-10-CM | POA: Diagnosis not present

## 2017-09-21 DIAGNOSIS — R69 Illness, unspecified: Secondary | ICD-10-CM | POA: Diagnosis not present

## 2017-09-21 DIAGNOSIS — I10 Essential (primary) hypertension: Secondary | ICD-10-CM | POA: Diagnosis not present

## 2017-09-21 DIAGNOSIS — F5101 Primary insomnia: Secondary | ICD-10-CM

## 2017-09-21 MED ORDER — AMITRIPTYLINE HCL 25 MG PO TABS: 25.0000 mg | ORAL_TABLET | Freq: Every day | ORAL | 2 refills | Status: DC

## 2017-09-21 NOTE — Progress Notes (Signed)
  Subjective:   Patient ID: [name], female    DOB: [birth year], 72 y.o.   MRN: [id number] CC: Medical Management of Chronic Issues  HPI: [name] is a 72 y.o. female   Hip pain: appt coming up with [facility]. Has been limiting how much she is walking.   L kidney with h/o hydronephorsis.  Following with urology.  Recently admitted with pyelonephritis.  May need stent placement in the future.  Feeling well now.  Normal appetite.  No fevers.  No dysuria.  Hypertension: Taking medicines regularly.  No lightheadedness, shortness of breath or chest pain.  Insomnia: Taking amitriptyline at bedtime.  Has been sleeping fine recently.  Chronic pain: Follows with pain management in [place].  Relevant past medical, surgical, family and social history reviewed. Allergies and medications reviewed and updated. Social History   Tobacco Use  Smoking Status Never Smoker  Smokeless Tobacco Never Used   ROS: Per HPI   Objective:    BP 127/68   Pulse 62   Temp 97.9 F (36.6 C) (Oral)   Ht 5' (1.524 m)   Wt 178 lb 9.6 oz (81 kg)   BMI 34.88 kg/m   Wt Readings from Last 3 Encounters:  09/21/17 178 lb 9.6 oz (81 kg)  08/22/17 184 lb 12.8 oz (83.8 kg)  08/01/17 179 lb 14.3 oz (81.6 kg)    Gen: NAD, alert, cooperative with exam, NCAT EYES: EOMI, no conjunctival injection, or no icterus ENT: OP without erythema LYMPH: no cervical LAD CV: NRRR, normal S1/S2, no murmur, distal pulses 2+ b/l Resp: CTABL, no wheezes, normal WOB Abd: +BS, soft, NTND.  No CVA tenderness Ext: No edema, warm Neuro: Alert and oriented  Assessment & Plan:  [name] was seen today for medical management of chronic issues.  Diagnoses and all orders for this visit:  Essential hypertension Stable, continue below. -     lisinopril-hydrochlorothiazide (PRINZIDE,ZESTORETIC) 10-12.5 MG tablet; Take 1 tablet by mouth daily.  Other chronic pain Stable, will continue below.  Also treats insomnia.   Following with pain management her back pain. -     amitriptyline (ELAVIL) 25 MG tablet; Take 1 tablet (25 mg total) by mouth at bedtime.  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  May need stent placement in the future.  Follow up plan: Return in about 6 months (around 03/24/2018). [name], MD [place]

## 2017-09-24 MED ORDER — LISINOPRIL-HYDROCHLOROTHIAZIDE 10-12.5 MG PO TABS: 1.0000 | ORAL_TABLET | Freq: Every day | ORAL | 1 refills | Status: DC

## 2017-10-06 DIAGNOSIS — Z96643 Presence of artificial hip joint, bilateral: Secondary | ICD-10-CM | POA: Diagnosis not present

## 2017-10-06 DIAGNOSIS — Z471 Aftercare following joint replacement surgery: Secondary | ICD-10-CM | POA: Diagnosis not present

## 2017-10-06 DIAGNOSIS — M160 Bilateral primary osteoarthritis of hip: Secondary | ICD-10-CM | POA: Diagnosis not present

## 2017-10-17 DIAGNOSIS — M79671 Pain in right foot: Secondary | ICD-10-CM | POA: Diagnosis not present

## 2017-10-17 DIAGNOSIS — M7741 Metatarsalgia, right foot: Secondary | ICD-10-CM | POA: Diagnosis not present

## 2017-10-20 ENCOUNTER — Other Ambulatory Visit: Payer: Self-pay

## 2017-10-20 ENCOUNTER — Encounter (HOSPITAL_COMMUNITY): Payer: Self-pay | Admitting: Emergency Medicine

## 2017-10-20 DIAGNOSIS — N12 Tubulo-interstitial nephritis, not specified as acute or chronic: Secondary | ICD-10-CM | POA: Diagnosis not present

## 2017-10-20 DIAGNOSIS — M1990 Unspecified osteoarthritis, unspecified site: Secondary | ICD-10-CM | POA: Diagnosis present

## 2017-10-20 DIAGNOSIS — E785 Hyperlipidemia, unspecified: Secondary | ICD-10-CM | POA: Diagnosis present

## 2017-10-20 DIAGNOSIS — Z96643 Presence of artificial hip joint, bilateral: Secondary | ICD-10-CM | POA: Diagnosis present

## 2017-10-20 DIAGNOSIS — A419 Sepsis, unspecified organism: Secondary | ICD-10-CM | POA: Diagnosis not present

## 2017-10-20 DIAGNOSIS — Z90710 Acquired absence of both cervix and uterus: Secondary | ICD-10-CM

## 2017-10-20 DIAGNOSIS — Z87440 Personal history of urinary (tract) infections: Secondary | ICD-10-CM

## 2017-10-20 DIAGNOSIS — Z9842 Cataract extraction status, left eye: Secondary | ICD-10-CM

## 2017-10-20 DIAGNOSIS — Z800 Family history of malignant neoplasm of digestive organs: Secondary | ICD-10-CM

## 2017-10-20 DIAGNOSIS — A4151 Sepsis due to Escherichia coli [E. coli]: Secondary | ICD-10-CM | POA: Diagnosis not present

## 2017-10-20 DIAGNOSIS — Z801 Family history of malignant neoplasm of trachea, bronchus and lung: Secondary | ICD-10-CM

## 2017-10-20 DIAGNOSIS — Z96651 Presence of right artificial knee joint: Secondary | ICD-10-CM | POA: Diagnosis present

## 2017-10-20 DIAGNOSIS — N390 Urinary tract infection, site not specified: Secondary | ICD-10-CM | POA: Diagnosis not present

## 2017-10-20 DIAGNOSIS — N183 Chronic kidney disease, stage 3 (moderate): Secondary | ICD-10-CM | POA: Diagnosis present

## 2017-10-20 DIAGNOSIS — I10 Essential (primary) hypertension: Secondary | ICD-10-CM | POA: Diagnosis not present

## 2017-10-20 DIAGNOSIS — Z8542 Personal history of malignant neoplasm of other parts of uterus: Secondary | ICD-10-CM

## 2017-10-20 DIAGNOSIS — N136 Pyonephrosis: Secondary | ICD-10-CM | POA: Diagnosis present

## 2017-10-20 DIAGNOSIS — G629 Polyneuropathy, unspecified: Secondary | ICD-10-CM | POA: Diagnosis present

## 2017-10-20 DIAGNOSIS — I129 Hypertensive chronic kidney disease with stage 1 through stage 4 chronic kidney disease, or unspecified chronic kidney disease: Secondary | ICD-10-CM | POA: Diagnosis present

## 2017-10-20 DIAGNOSIS — Z79899 Other long term (current) drug therapy: Secondary | ICD-10-CM

## 2017-10-20 DIAGNOSIS — Z888 Allergy status to other drugs, medicaments and biological substances status: Secondary | ICD-10-CM

## 2017-10-20 DIAGNOSIS — Z981 Arthrodesis status: Secondary | ICD-10-CM

## 2017-10-20 DIAGNOSIS — Z91018 Allergy to other foods: Secondary | ICD-10-CM

## 2017-10-20 DIAGNOSIS — Z9841 Cataract extraction status, right eye: Secondary | ICD-10-CM

## 2017-10-20 DIAGNOSIS — Z882 Allergy status to sulfonamides status: Secondary | ICD-10-CM

## 2017-10-20 NOTE — ED Triage Notes (Signed)
Patient has been on antibiotics for uti since march. Patient states it is burning when she urinates. Patient states her left lower abdominal and left flank is painful.

## 2017-10-21 ENCOUNTER — Inpatient Hospital Stay (HOSPITAL_COMMUNITY): Payer: Medicare HMO

## 2017-10-21 ENCOUNTER — Inpatient Hospital Stay (HOSPITAL_COMMUNITY)
Admission: EM | Admit: 2017-10-21 | Discharge: 2017-10-23 | DRG: 872 | Disposition: A | Discharge status: Home / Self Care | Payer: Medicare HMO | Attending: Family Medicine | Admitting: Family Medicine

## 2017-10-21 ENCOUNTER — Emergency Department (HOSPITAL_COMMUNITY): Payer: Medicare HMO

## 2017-10-21 DIAGNOSIS — Z87448 Personal history of other diseases of urinary system: Secondary | ICD-10-CM | POA: Diagnosis present

## 2017-10-21 DIAGNOSIS — Z888 Allergy status to other drugs, medicaments and biological substances status: Secondary | ICD-10-CM | POA: Diagnosis not present

## 2017-10-21 DIAGNOSIS — I10 Essential (primary) hypertension: Secondary | ICD-10-CM | POA: Diagnosis present

## 2017-10-21 DIAGNOSIS — N136 Pyonephrosis: Secondary | ICD-10-CM | POA: Diagnosis not present

## 2017-10-21 DIAGNOSIS — N1330 Unspecified hydronephrosis: Secondary | ICD-10-CM

## 2017-10-21 DIAGNOSIS — Z91018 Allergy to other foods: Secondary | ICD-10-CM | POA: Diagnosis not present

## 2017-10-21 DIAGNOSIS — A4151 Sepsis due to Escherichia coli [E. coli]: Secondary | ICD-10-CM

## 2017-10-21 DIAGNOSIS — N390 Urinary tract infection, site not specified: Secondary | ICD-10-CM | POA: Diagnosis not present

## 2017-10-21 DIAGNOSIS — G629 Polyneuropathy, unspecified: Secondary | ICD-10-CM | POA: Diagnosis not present

## 2017-10-21 DIAGNOSIS — D72829 Elevated white blood cell count, unspecified: Secondary | ICD-10-CM | POA: Diagnosis not present

## 2017-10-21 DIAGNOSIS — Z87440 Personal history of urinary (tract) infections: Secondary | ICD-10-CM | POA: Diagnosis not present

## 2017-10-21 DIAGNOSIS — R109 Unspecified abdominal pain: Secondary | ICD-10-CM | POA: Diagnosis present

## 2017-10-21 DIAGNOSIS — Z9842 Cataract extraction status, left eye: Secondary | ICD-10-CM | POA: Diagnosis not present

## 2017-10-21 DIAGNOSIS — N183 Chronic kidney disease, stage 3 (moderate): Secondary | ICD-10-CM | POA: Diagnosis not present

## 2017-10-21 DIAGNOSIS — Z981 Arthrodesis status: Secondary | ICD-10-CM | POA: Diagnosis not present

## 2017-10-21 DIAGNOSIS — R509 Fever, unspecified: Secondary | ICD-10-CM | POA: Diagnosis not present

## 2017-10-21 DIAGNOSIS — Z801 Family history of malignant neoplasm of trachea, bronchus and lung: Secondary | ICD-10-CM | POA: Diagnosis not present

## 2017-10-21 DIAGNOSIS — Z90710 Acquired absence of both cervix and uterus: Secondary | ICD-10-CM | POA: Diagnosis not present

## 2017-10-21 DIAGNOSIS — N1339 Other hydronephrosis: Secondary | ICD-10-CM | POA: Diagnosis not present

## 2017-10-21 DIAGNOSIS — M1990 Unspecified osteoarthritis, unspecified site: Secondary | ICD-10-CM | POA: Diagnosis not present

## 2017-10-21 DIAGNOSIS — Z96651 Presence of right artificial knee joint: Secondary | ICD-10-CM | POA: Diagnosis present

## 2017-10-21 DIAGNOSIS — Z8542 Personal history of malignant neoplasm of other parts of uterus: Secondary | ICD-10-CM | POA: Diagnosis not present

## 2017-10-21 DIAGNOSIS — N12 Tubulo-interstitial nephritis, not specified as acute or chronic: Secondary | ICD-10-CM | POA: Diagnosis not present

## 2017-10-21 DIAGNOSIS — Z800 Family history of malignant neoplasm of digestive organs: Secondary | ICD-10-CM | POA: Diagnosis not present

## 2017-10-21 DIAGNOSIS — Z9841 Cataract extraction status, right eye: Secondary | ICD-10-CM | POA: Diagnosis not present

## 2017-10-21 DIAGNOSIS — I129 Hypertensive chronic kidney disease with stage 1 through stage 4 chronic kidney disease, or unspecified chronic kidney disease: Secondary | ICD-10-CM | POA: Diagnosis not present

## 2017-10-21 DIAGNOSIS — A419 Sepsis, unspecified organism: Secondary | ICD-10-CM | POA: Diagnosis not present

## 2017-10-21 DIAGNOSIS — Z79899 Other long term (current) drug therapy: Secondary | ICD-10-CM | POA: Diagnosis not present

## 2017-10-21 DIAGNOSIS — E785 Hyperlipidemia, unspecified: Secondary | ICD-10-CM | POA: Diagnosis not present

## 2017-10-21 DIAGNOSIS — Z96643 Presence of artificial hip joint, bilateral: Secondary | ICD-10-CM | POA: Diagnosis present

## 2017-10-21 DIAGNOSIS — Z882 Allergy status to sulfonamides status: Secondary | ICD-10-CM | POA: Diagnosis not present

## 2017-10-21 LAB — BLOOD CULTURE ID PANEL (REFLEXED)
Acinetobacter baumannii: NOT DETECTED
CANDIDA GLABRATA: NOT DETECTED
CANDIDA KRUSEI: NOT DETECTED
CARBAPENEM RESISTANCE: NOT DETECTED
Candida albicans: NOT DETECTED
Candida parapsilosis: NOT DETECTED
Candida tropicalis: NOT DETECTED
ESCHERICHIA COLI: DETECTED — AB
Enterobacter cloacae complex: NOT DETECTED
Enterobacteriaceae species: DETECTED — AB
Enterococcus species: NOT DETECTED
Haemophilus influenzae: NOT DETECTED
KLEBSIELLA OXYTOCA: NOT DETECTED
Klebsiella pneumoniae: NOT DETECTED
Listeria monocytogenes: NOT DETECTED
Neisseria meningitidis: NOT DETECTED
PROTEUS SPECIES: NOT DETECTED
Pseudomonas aeruginosa: NOT DETECTED
SERRATIA MARCESCENS: NOT DETECTED
STAPHYLOCOCCUS AUREUS BCID: NOT DETECTED
STAPHYLOCOCCUS SPECIES: NOT DETECTED
STREPTOCOCCUS PNEUMONIAE: NOT DETECTED
Streptococcus agalactiae: NOT DETECTED
Streptococcus pyogenes: NOT DETECTED
Streptococcus species: NOT DETECTED

## 2017-10-21 LAB — URINALYSIS, ROUTINE W REFLEX MICROSCOPIC
Bilirubin Urine: NEGATIVE
GLUCOSE, UA: NEGATIVE mg/dL
KETONES UR: NEGATIVE mg/dL
NITRITE: NEGATIVE
PH: 5 (ref 5.0–8.0)
PROTEIN: 100 mg/dL — AB
Specific Gravity, Urine: 1.018 (ref 1.005–1.030)

## 2017-10-21 LAB — CBC WITH DIFFERENTIAL/PLATELET
BASOS ABS: 0 10*3/uL (ref 0.0–0.1)
BASOS PCT: 0 %
EOS ABS: 0 10*3/uL (ref 0.0–0.7)
EOS PCT: 0 %
HCT: 42.4 % (ref 36.0–46.0)
HEMOGLOBIN: 14.5 g/dL (ref 12.0–15.0)
Lymphocytes Relative: 10 %
Lymphs Abs: 1.7 10*3/uL (ref 0.7–4.0)
MCH: 32.2 pg (ref 26.0–34.0)
MCHC: 34.2 g/dL (ref 30.0–36.0)
MCV: 94 fL (ref 78.0–100.0)
Monocytes Absolute: 1 10*3/uL (ref 0.1–1.0)
Monocytes Relative: 6 %
NEUTROS PCT: 84 %
Neutro Abs: 14.4 10*3/uL — ABNORMAL HIGH (ref 1.7–7.7)
PLATELETS: 333 10*3/uL (ref 150–400)
RBC: 4.51 MIL/uL (ref 3.87–5.11)
RDW: 13.2 % (ref 11.5–15.5)
WBC: 17.1 10*3/uL — AB (ref 4.0–10.5)

## 2017-10-21 LAB — COMPREHENSIVE METABOLIC PANEL
ALT: 20 U/L (ref 14–54)
AST: 23 U/L (ref 15–41)
Albumin: 4.5 g/dL (ref 3.5–5.0)
Alkaline Phosphatase: 62 U/L (ref 38–126)
Anion gap: 10 (ref 5–15)
BILIRUBIN TOTAL: 1.3 mg/dL — AB (ref 0.3–1.2)
BUN: 15 mg/dL (ref 6–20)
CALCIUM: 9.6 mg/dL (ref 8.9–10.3)
CO2: 24 mmol/L (ref 22–32)
CREATININE: 1.05 mg/dL — AB (ref 0.44–1.00)
Chloride: 103 mmol/L (ref 101–111)
GFR, EST AFRICAN AMERICAN: 60 mL/min — AB (ref >60)
GFR, EST NON AFRICAN AMERICAN: 52 mL/min — AB (ref >60)
Glucose, Bld: 166 mg/dL — ABNORMAL HIGH (ref 65–99)
Potassium: 4.2 mmol/L (ref 3.5–5.1)
Sodium: 137 mmol/L (ref 135–145)
TOTAL PROTEIN: 7.5 g/dL (ref 6.5–8.1)

## 2017-10-21 LAB — I-STAT CG4 LACTIC ACID, ED
Lactic Acid, Venous: 1.75 mmol/L (ref 0.5–1.9)
Lactic Acid, Venous: 1.92 mmol/L — ABNORMAL HIGH (ref 0.5–1.9)

## 2017-10-21 MED ORDER — SODIUM CHLORIDE 0.9 % IV SOLN
1000.0000 mL | INTRAVENOUS | Status: DC
  Administered 2017-10-21 (×2): 1000 mL via INTRAVENOUS

## 2017-10-21 MED ORDER — ONDANSETRON HCL 4 MG PO TABS: 4.0000 mg | ORAL_TABLET | Freq: Four times a day (QID) | ORAL | Status: DC | PRN

## 2017-10-21 MED ORDER — HYDROCHLOROTHIAZIDE 12.5 MG PO CAPS
12.5000 mg | ORAL_CAPSULE | Freq: Every day | ORAL | Status: DC
  Administered 2017-10-21 – 2017-10-23 (×3): 12.5 mg via ORAL
  Filled 2017-10-21 (×3): qty 1

## 2017-10-21 MED ORDER — AMITRIPTYLINE HCL 25 MG PO TABS
25.0000 mg | ORAL_TABLET | Freq: Every day | ORAL | Status: DC
  Administered 2017-10-21 – 2017-10-22 (×2): 25 mg via ORAL
  Filled 2017-10-21 (×2): qty 1

## 2017-10-21 MED ORDER — HYDROCODONE-ACETAMINOPHEN 5-325 MG PO TABS
1.0000 | ORAL_TABLET | Freq: Four times a day (QID) | ORAL | Status: DC | PRN
  Administered 2017-10-21 – 2017-10-23 (×6): 1 via ORAL
  Filled 2017-10-21 (×6): qty 1

## 2017-10-21 MED ORDER — ONDANSETRON HCL 4 MG/2ML IJ SOLN: 4.0000 mg | Freq: Four times a day (QID) | INTRAMUSCULAR | Status: DC | PRN

## 2017-10-21 MED ORDER — SODIUM CHLORIDE 0.9 % IV SOLN
2.0000 g | INTRAVENOUS | Status: DC
  Administered 2017-10-21 – 2017-10-23 (×3): 2 g via INTRAVENOUS
  Filled 2017-10-21: qty 20
  Filled 2017-10-21: qty 2
  Filled 2017-10-21: qty 20

## 2017-10-21 MED ORDER — ENOXAPARIN SODIUM 40 MG/0.4ML ~~LOC~~ SOLN
40.0000 mg | Freq: Every day | SUBCUTANEOUS | Status: DC
  Administered 2017-10-21 – 2017-10-23 (×3): 40 mg via SUBCUTANEOUS
  Filled 2017-10-21 (×3): qty 0.4

## 2017-10-21 MED ORDER — FLUTICASONE PROPIONATE 50 MCG/ACT NA SUSP
2.0000 | Freq: Every day | NASAL | Status: DC
  Administered 2017-10-21 – 2017-10-23 (×3): 2 via NASAL
  Filled 2017-10-21: qty 16

## 2017-10-21 MED ORDER — SODIUM CHLORIDE 0.9 % IV SOLN
1000.0000 mL | INTRAVENOUS | Status: DC
  Administered 2017-10-21 – 2017-10-22 (×2): 1000 mL via INTRAVENOUS

## 2017-10-21 MED ORDER — METHOCARBAMOL 500 MG PO TABS
500.0000 mg | ORAL_TABLET | Freq: Four times a day (QID) | ORAL | Status: DC
  Administered 2017-10-21 – 2017-10-23 (×7): 500 mg via ORAL
  Filled 2017-10-21 (×7): qty 1

## 2017-10-21 MED ORDER — LISINOPRIL-HYDROCHLOROTHIAZIDE 10-12.5 MG PO TABS: 1.0000 | ORAL_TABLET | Freq: Every day | ORAL | Status: DC

## 2017-10-21 MED ORDER — LISINOPRIL 10 MG PO TABS
10.0000 mg | ORAL_TABLET | Freq: Every day | ORAL | Status: DC
  Administered 2017-10-21 – 2017-10-23 (×3): 10 mg via ORAL
  Filled 2017-10-21 (×3): qty 1

## 2017-10-21 MED ORDER — GABAPENTIN 300 MG PO CAPS
300.0000 mg | ORAL_CAPSULE | Freq: Every day | ORAL | Status: DC
  Administered 2017-10-21 – 2017-10-22 (×2): 300 mg via ORAL
  Filled 2017-10-21 (×2): qty 1

## 2017-10-21 MED ORDER — ACETAMINOPHEN 325 MG PO TABS
650.0000 mg | ORAL_TABLET | Freq: Four times a day (QID) | ORAL | Status: DC | PRN
Start: 2017-10-21 — End: 2017-10-23

## 2017-10-21 MED ORDER — ACETAMINOPHEN 650 MG RE SUPP: 650.0000 mg | Freq: Four times a day (QID) | RECTAL | Status: DC | PRN

## 2017-10-21 NOTE — Progress Notes (Signed)
PHARMACY - PHYSICIAN COMMUNICATION CRITICAL VALUE ALERT - BLOOD CULTURE IDENTIFICATION (BCID)  [name] is an 72 y.o. female who presented to [facility] on 10/21/2017 with a chief complaint of UTI  Assessment:  urosepsis  Name of physician (or Provider) Contacted: [facility]  Current antibiotics: Rocephin 2 gm IV q24TRH1   Changes to prescribed antibiotics recommended:  Patient is on recommended antibiotics - No changes needed  Results for orders placed or performed during the hospital encounter of 10/21/17  Blood Culture ID Panel (Reflexed) (Collected: 10/21/2017  3:21 AM)  Result Value Ref Range   Enterococcus species NOT DETECTED NOT DETECTED   Listeria monocytogenes NOT DETECTED NOT DETECTED   Staphylococcus species NOT DETECTED NOT DETECTED   Staphylococcus aureus NOT DETECTED NOT DETECTED   Streptococcus species NOT DETECTED NOT DETECTED   Streptococcus agalactiae NOT DETECTED NOT DETECTED   Streptococcus pneumoniae NOT DETECTED NOT DETECTED   Streptococcus pyogenes NOT DETECTED NOT DETECTED   Acinetobacter baumannii NOT DETECTED NOT DETECTED   Enterobacteriaceae species DETECTED (A) NOT DETECTED   Enterobacter cloacae complex NOT DETECTED NOT DETECTED   Escherichia coli DETECTED (A) NOT DETECTED   Klebsiella oxytoca NOT DETECTED NOT DETECTED   Klebsiella pneumoniae NOT DETECTED NOT DETECTED   Proteus species NOT DETECTED NOT DETECTED   Serratia marcescens NOT DETECTED NOT DETECTED   Carbapenem resistance NOT DETECTED NOT DETECTED   Haemophilus influenzae NOT DETECTED NOT DETECTED   Neisseria meningitidis NOT DETECTED NOT DETECTED   Pseudomonas aeruginosa NOT DETECTED NOT DETECTED   Candida albicans NOT DETECTED NOT DETECTED   Candida glabrata NOT DETECTED NOT DETECTED   Candida krusei NOT DETECTED NOT DETECTED   Candida parapsilosis NOT DETECTED NOT DETECTED   Candida tropicalis NOT DETECTED NOT DETECTED    Eudelia Bunch, Pharm.D. [phone] 10/21/2017  7:25 PM

## 2017-10-21 NOTE — ED Notes (Signed)
ED TO INPATIENT HANDOFF REPORT  Name/Age/Gender [name] 72 y.o. female  Code Status    Code Status Orders  (From admission, onward)        Start     Ordered   10/21/17 0401  Full code  Continuous     10/21/17 0404    Code Status History    Date Active Date Inactive Code Status Order ID Comments User Context   04/26/2016 0215 04/29/2016 1858 Full Code [id number]  Rise Patience, MD Inpatient   02/08/2016 1657 02/11/2016 1551 Full Code [id number]  Waldemar Dickens, MD ED   07/04/2014 1550 07/07/2014 1614 Full Code [id number]  Gearlean Alf, MD Inpatient   04/23/2013 1619 04/24/2013 1729 Full Code [id number]  Michael Boston, MD Inpatient   12/17/2012 1636 12/19/2012 1432 Full Code [id number]  Kristeen Miss, MD Inpatient   04/27/2012 0643 05/01/2012 1808 Full Code [id number]  Gross, Revonda Standard., MD ED      Home/SNF/Other Home  Chief Complaint UTI  Level of Care/Admitting Diagnosis ED Disposition    ED Disposition Condition [facility] Area: [facility] [[id number]]  Level of Care: Med-Surg [16]  Diagnosis: Pyelonephritis [272536]  Admitting Physician: Doreatha Massed  Attending Physician: Etta Quill [phone]  Estimated length of stay: past midnight tomorrow  Certification:: I certify this patient will need inpatient services for at least 2 midnights  PT Class (Do Not Modify): Inpatient [101]  PT Acc Code (Do Not Modify): Private [1]       Medical History Past Medical History:  Diagnosis Date  . Arthritis   . Colon polyp, hyperplastic 04/27/2012   Colonoscopy 2010.  No adenomatous polyps   . Endometrioid adenocarcinoma   . History of endometrial cancer s/p TAH BSO June2013 06/15/2011   Grade 1 endometrioid adenocarcinoma with focal superficial myometrial invasion of 0.1 cm with a myometrium of 1.8 cm or approximately 7%. She had a 5-cm tumor. No lymphovascular space Involvement.    . Hyperlipidemia   . Hypertension   .  Mass of in fold of jejunum s/p SB resection 07/19/2012 07/20/2012  . Obesity (BMI 30-39.9) 04/27/2012  . Pyelonephritis 02/2016  . 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 SBO (small bowel obstruction) - recurrent 04/27/2012  . Ureteral adhesion, left 2010   s/p ureterolysis & stenting Dr. Larwance Sachs now  . UTI (urinary tract infection)     Allergies Allergies  Allergen Reactions  . Cashew Nut Oil Hives  . Dog Epithelium Hives and Itching  . Ondansetron Hcl Nausea And Vomiting    "made me throw up"  . Sulfa Antibiotics     "blistering lips and mouth sores"  . Sulfamethoxazole     Mouth sores  . Zofran Hives and Nausea Only    IV Location/Drains/Wounds Patient Lines/Drains/Airways Status   Active Line/Drains/Airways    Name:   Placement date:   Placement time:   Site:   Days:   Peripheral IV 10/21/17 Right Hand   10/21/17    0245    Hand   less than 1          Labs/Imaging Results for orders placed or performed during the hospital encounter of 10/21/17 (from the past 48 hour(s))  Urinalysis, Routine w reflex microscopic     Status: Abnormal   Collection Time: 10/21/17 12:13 AM  Result Value Ref Range   Color, Urine YELLOW YELLOW   APPearance CLOUDY (  A) CLEAR   Specific Gravity, Urine 1.018 1.005 - 1.030   pH 5.0 5.0 - 8.0   Glucose, UA NEGATIVE NEGATIVE mg/dL   Hgb urine dipstick MODERATE (A) NEGATIVE   Bilirubin Urine NEGATIVE NEGATIVE   Ketones, ur NEGATIVE NEGATIVE mg/dL   Protein, ur 100 (A) NEGATIVE mg/dL   Nitrite NEGATIVE NEGATIVE   Leukocytes, UA LARGE (A) NEGATIVE   RBC / HPF >50 (H) 0 - 5 RBC/hpf   WBC, UA >50 (H) 0 - 5 WBC/hpf   Bacteria, UA RARE (A) NONE SEEN   WBC Clumps PRESENT    Mucus PRESENT     Comment: Performed at [facility], [place] [address], [place], [place] [address]  Comprehensive metabolic panel     Status: Abnormal   Collection Time: 10/21/17 12:16 AM  Result  Value Ref Range   Sodium 137 135 - 145 mmol/L   Potassium 4.2 3.5 - 5.1 mmol/L   Chloride 103 101 - 111 mmol/L   CO2 24 22 - 32 mmol/L   Glucose, Bld 166 (H) 65 - 99 mg/dL   BUN 15 6 - 20 mg/dL   Creatinine, Ser 1.05 (H) 0.44 - 1.00 mg/dL   Calcium 9.6 8.9 - 10.3 mg/dL   Total Protein 7.5 6.5 - 8.1 g/dL   Albumin 4.5 3.5 - 5.0 g/dL   AST 23 15 - 41 U/L   ALT 20 14 - 54 U/L   Alkaline Phosphatase 62 38 - 126 U/L   Total Bilirubin 1.3 (H) 0.3 - 1.2 mg/dL   GFR calc non Af Amer 52 (L) >60 mL/min   GFR calc Af Amer 60 (L) >60 mL/min    Comment: (NOTE) The eGFR has been calculated using the CKD EPI equation. This calculation has not been validated in all clinical situations. eGFR's persistently <60 mL/min signify possible Chronic Kidney Disease.    Anion gap 10 5 - 15    Comment: Performed at [facility], [place] [address], [place], [place] [address]  CBC with Differential     Status: Abnormal   Collection Time: 10/21/17 12:16 AM  Result Value Ref Range   WBC 17.1 (H) 4.0 - 10.5 K/uL   RBC 4.51 3.87 - 5.11 MIL/uL   Hemoglobin 14.5 12.0 - 15.0 g/dL   HCT 42.4 36.0 - 46.0 %   MCV 94.0 78.0 - 100.0 fL   MCH 32.2 26.0 - 34.0 pg   MCHC 34.2 30.0 - 36.0 g/dL   RDW 13.2 11.5 - 15.5 %   Platelets 333 150 - 400 K/uL   Neutrophils Relative % 84 %   Neutro Abs 14.4 (H) 1.7 - 7.7 K/uL   Lymphocytes Relative 10 %   Lymphs Abs 1.7 0.7 - 4.0 K/uL   Monocytes Relative 6 %   Monocytes Absolute 1.0 0.1 - 1.0 K/uL   Eosinophils Relative 0 %   Eosinophils Absolute 0.0 0.0 - 0.7 K/uL   Basophils Relative 0 %   Basophils Absolute 0.0 0.0 - 0.1 K/uL    Comment: Performed at [facility], [place] [address], [place], [place] [address]  I-Stat CG4 Lactic Acid, ED     Status: Abnormal   Collection Time: 10/21/17 12:23 AM  Result Value Ref Range   Lactic Acid, Venous 1.92 (H) 0.5 - 1.9 mmol/L  I-Stat CG4 Lactic Acid, ED     Status: None   Collection Time: 10/21/17   2:54 AM  Result Value Ref Range   Lactic  Acid, Venous 1.75 0.5 - 1.9 mmol/L  Blood Culture (routine x 2)     Status: None (Preliminary result)   Collection Time: 10/21/17  3:21 AM  Result Value Ref Range   Specimen Description BLOOD RIGHT HAND    Special Requests      BOTTLES DRAWN AEROBIC ONLY Blood Culture adequate volume Performed at [facility] [place] [address], [place], [place] [address]    Culture PENDING    Report Status PENDING    Dg Chest 2 View  Result Date: 10/21/2017 CLINICAL DATA:  Fever EXAM: CHEST - 2 VIEW COMPARISON:  02/08/2016 FINDINGS: Linear atelectasis or scar at the left base. No acute consolidation or effusion. Normal heart size. No pneumothorax. Degenerative changes of the spine. IMPRESSION: No active cardiopulmonary disease. Linear scarring or atelectasis at the left base. Electronically Signed   By: Donavan Foil M.D.   On: 10/21/2017 03:32    Pending Labs Unresulted Labs (From admission, onward)   Start     Ordered   10/21/17 0257  Blood Culture (routine x 2)  BLOOD CULTURE X 2,   STAT     10/21/17 0257   10/21/17 0257  Urine Culture  Once,   R     10/21/17 0257      Vitals/Pain Today's Vitals   10/21/17 0300 10/21/17 0330 10/21/17 0400 10/21/17 0430  BP: 121/64 130/60 133/62 134/62  Pulse: 72 67 68 71  Resp:  12 (!) 22 14  Temp:      TempSrc:      SpO2: 93% 96% 98% 99%  Weight:      Height:      PainSc:        Isolation Precautions No active isolations  Medications Medications  0.9 %  sodium chloride infusion (1,000 mLs Intravenous New Bag/Given 10/21/17 0328)  cefTRIAXone (ROCEPHIN) 2 g in sodium chloride 0.9 % 100 mL IVPB (0 g Intravenous Stopped 10/21/17 0451)  gabapentin (NEURONTIN) capsule 300 mg (has no administration in time range)  amitriptyline (ELAVIL) tablet 25 mg (has no administration in time range)  fluticasone (FLONASE) 50 MCG/ACT nasal spray 2 spray (has no administration in time range)  methocarbamol  (ROBAXIN) tablet 500 mg (has no administration in time range)  acetaminophen (TYLENOL) tablet 650 mg (has no administration in time range)    Or  acetaminophen (TYLENOL) suppository 650 mg (has no administration in time range)  ondansetron (ZOFRAN) tablet 4 mg (has no administration in time range)    Or  ondansetron (ZOFRAN) injection 4 mg (has no administration in time range)  enoxaparin (LOVENOX) injection 40 mg (has no administration in time range)  HYDROcodone-acetaminophen (NORCO/VICODIN) 5-325 MG per tablet 1 tablet (has no administration in time range)    Mobility walks

## 2017-10-21 NOTE — ED Notes (Addendum)
Urine culture sent down with UA. Blue top and gold top tubes sent down as well.

## 2017-10-21 NOTE — ED Provider Notes (Addendum)
[facility] DEPT Provider Note   CSN: [id number] Arrival date & time: 10/20/17  2339     History   Chief Complaint Chief Complaint  Patient presents with  . Urinary Tract Infection    HPI [name] is a 72 y.o. female.  Patient presents to the ER for evaluation of possible urinary tract infection.  Patient reports that she has a history of frequent and recurrent urinary tract infections.  Patient has a history of fibrosis and chronic hydronephrosis and hydroureter on the left side after urinary tract injury from the surgery years ago.  She reports that over the last couple of days she has not been feeling well and then today she started experiencing left-sided flank pain that she normally gets with a urinary tract infection.  She has had mild nausea, no vomiting.  She has had associated dysuria.     Past Medical History:  Diagnosis Date  . Arthritis   . Colon polyp, hyperplastic 04/27/2012   Colonoscopy 2010.  No adenomatous polyps   . Endometrioid adenocarcinoma   . History of endometrial cancer s/p TAH BSO June2013 06/15/2011   Grade 1 endometrioid adenocarcinoma with focal superficial myometrial invasion of 0.1 cm with a myometrium of 1.8 cm or approximately 7%. She had a 5-cm tumor. No lymphovascular space Involvement.    . Hyperlipidemia   . Hypertension   . Mass of in fold of jejunum s/p SB resection 07/19/2012 07/20/2012  . Obesity (BMI 30-39.9) 04/27/2012  . Pyelonephritis 02/2016  . 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 SBO (small bowel obstruction) - recurrent 04/27/2012  . Ureteral adhesion, left 2010   s/p ureterolysis & stenting Dr. Larwance Sachs now  . UTI (urinary tract infection)     Patient Active Problem List   Diagnosis Date Noted  . Pyelonephritis 08/01/2017  . Class 2 severe obesity with serious comorbidity and body mass index (BMI) of 35.0 to 35.9 in adult  ([place]) 06/21/2017  . History of hydronephrosis   . Low back pain   . Abdominal pain 04/26/2016  . Left flank pain   . Intractable abdominal pain 04/25/2016  . Recurrent UTI 02/20/2016  . Peripheral neuropathy 02/08/2016  . Depression 02/08/2016  . OA (osteoarthritis) of hip 07/04/2014  . 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 Pseudoarthrosis L3- L5 12/17/2012  . Nocturnal leg cramps 11/13/2012  . HLD (hyperlipidemia) 08/10/2012  . Recurrent ventral incisional hernia s/p lap repair 04/23/2013 04/27/2012  . Obesity (BMI 30-39.9) 04/27/2012  . Chronic pain 04/27/2012  . Hypertension   . History of endometrial cancer s/p TAH BSO June2013 06/15/2011    Past Surgical History:  Procedure Laterality Date  . ABDOMINAL HYSTERECTOMY  11/02/10   TAH, BSO, lysis of adhesions for endometrial cancer  . [place], 2009   anterior L2-3, L3-4 arthrodesis  . BACK SURGERY  12/17/2012   Spinal fusion and repair   . BLADDER SUSPENSION  2003  . CATARACT EXTRACTION, BILATERAL  2003  . EYE SURGERY    . FOOT SURGERY  06/2010   left foot surgery  . HERNIA REPAIR  07/19/12   lap [place] repair  . INCISIONAL HERNIA REPAIR  11/02/10  . INSERTION OF MESH N/A 04/23/2013   Procedure: INSERTION OF MESH;  Surgeon: Adin Hector, MD;  Location: [facility];  Service: General;  Laterality: N/A;  . JOINT REPLACEMENT  09/2009  Right total knee  . KIDNEY SURGERY  2010   Left kidney  . LAPAROSCOPIC LYSIS OF ADHESIONS N/A 04/23/2013   Procedure: LAPAROSCOPIC LYSIS OF ADHESIONS;  Surgeon: Adin Hector, MD;  Location: [facility];  Service: General;  Laterality: N/A;  . TOTAL HIP ARTHROPLASTY  01/2010   Right total hip  . TOTAL HIP ARTHROPLASTY Left 07/04/2014   Procedure: LEFT TOTAL HIP ARTHROPLASTY ANTERIOR APPROACH;  Surgeon: Gearlean Alf, MD;  Location: [facility];  Service: Orthopedics;  Laterality: Left;  . TOTAL KNEE ARTHROPLASTY     right  . URETEROLYSIS  2010     lap/open left ureterolysis & omental flap, ureter stent  . VENTRAL HERNIA REPAIR N/A 07/19/2012   Procedure: LAPAROSCOPIC LYSIS OF ADHESIONS, SMALL BOWEL RESECTION, SEROSAL REPAIR, PRIMARY VENTRAL HERNIA REPAIR;  Surgeon: Adin Hector, MD;  Location: [facility];  Service: General;  Laterality: N/A;  . VENTRAL HERNIA REPAIR N/A 04/23/2013   Procedure: LAPAROSCOPIC exploration and repair of hernia in abdominal VENTRAL wall  HERNIA;  Surgeon: Adin Hector, MD;  Location: [facility];  Service: General;  Laterality: N/A;     OB History   None      Home Medications    Prior to Admission medications   Medication Sig Start Date End Date Taking? Authorizing Provider  amitriptyline (ELAVIL) 25 MG tablet Take 1 tablet (25 mg total) by mouth at bedtime. 09/21/17   [name], MD  fexofenadine [facility] ALLERGY) 180 MG tablet Take 1 tablet (180 mg total) by mouth daily. Patient not taking: Reported on 09/21/2017 07/31/17   Janora Norlander, DO  fluticasone (FLONASE) 50 MCG/ACT nasal spray Place 2 sprays into both nostrils daily.    [provider]  gabapentin (NEURONTIN) 300 MG capsule Take 2 capsules (600 mg total) by mouth at bedtime. 06/21/17   [name], MD  lisinopril-hydrochlorothiazide (PRINZIDE,ZESTORETIC) 10-12.5 MG tablet Take 1 tablet by mouth daily. 09/24/17   [name], MD  methocarbamol (ROBAXIN) 500 MG tablet Take 500 mg by mouth 4 (four) times daily.    [provider]  Sennosides (EX-LAX PO) Take 2-4 tablets by mouth daily as needed (CONSTIPATION).    [provider]    Family History Family History  Problem Relation Age of Onset  . Diabetes Mother   . Cancer Mother        lung  . Diabetes Father   . Cancer Father        liver  . Colon cancer Sister   . Diabetes Sister   . Cancer Sister        colon  . Breast cancer Neg Hx   . Allergic rhinitis Neg Hx   . Angioedema Neg Hx   . Asthma Neg Hx   . Atopy Neg Hx   . Eczema Neg Hx    . Immunodeficiency Neg Hx   . Urticaria Neg Hx     Social History Social History   Tobacco Use  . Smoking status: Never Smoker  . Smokeless tobacco: Never Used  Substance Use Topics  . Alcohol use: No  . Drug use: No     Allergies   Cashew nut oil; Ondansetron hcl; Sulfa antibiotics; Sulfamethoxazole; and Zofran   Review of Systems Review of Systems  Genitourinary: Positive for dysuria and flank pain.  All other systems reviewed and are negative.    Physical Exam Updated Vital Signs BP 130/60   Pulse 67   Temp 100.3 F (37.9 C) (  Oral)   Resp 12   Ht 5' (1.524 m)   Wt 80.7 kg (178 lb)   SpO2 96%   BMI 34.76 kg/m   Physical Exam  Constitutional: She is oriented to person, place, and time. She appears well-developed and well-nourished. No distress.  HENT:  Head: Normocephalic and atraumatic.  Right Ear: Hearing normal.  Left Ear: Hearing normal.  Nose: Nose normal.  Mouth/Throat: Oropharynx is clear and moist and mucous membranes are normal.  Eyes: Pupils are equal, round, and reactive to light. Conjunctivae and EOM are normal.  Neck: Normal range of motion. Neck supple.  Cardiovascular: Regular rhythm, S1 normal and S2 normal. Exam reveals no gallop and no friction rub.  No murmur heard. Pulmonary/Chest: Effort normal and breath sounds normal. No respiratory distress. She exhibits no tenderness.  Abdominal: Soft. Normal appearance and bowel sounds are normal. There is no hepatosplenomegaly. There is no tenderness. There is no rebound, no guarding, no tenderness at McBurney's point and negative Murphy's sign. No hernia.  Musculoskeletal: Normal range of motion.  Neurological: She is alert and oriented to person, place, and time. She has normal strength. No cranial nerve deficit or sensory deficit. Coordination normal. GCS eye subscore is 4. GCS verbal subscore is 5. GCS motor subscore is 6.  Skin: Skin is warm, dry and intact. No rash noted. No cyanosis.   Psychiatric: She has a normal mood and affect. Her speech is normal and behavior is normal. Thought content normal.  Nursing note and vitals reviewed.    ED Treatments / Results  Labs (all labs ordered are listed, but only abnormal results are displayed) Labs Reviewed  COMPREHENSIVE METABOLIC PANEL - Abnormal; Notable for the following components:      Result Value   Glucose, Bld 166 (*)    Creatinine, Ser 1.05 (*)    Total Bilirubin 1.3 (*)    GFR calc non Af Amer 52 (*)    GFR calc Af Amer 60 (*)    All other components within normal limits  CBC WITH DIFFERENTIAL/PLATELET - Abnormal; Notable for the following components:   WBC 17.1 (*)    Neutro Abs 14.4 (*)    All other components within normal limits  URINALYSIS, ROUTINE W REFLEX MICROSCOPIC - Abnormal; Notable for the following components:   APPearance CLOUDY (*)    Hgb urine dipstick MODERATE (*)    Protein, ur 100 (*)    Leukocytes, UA LARGE (*)    RBC / HPF >50 (*)    WBC, UA >50 (*)    Bacteria, UA RARE (*)    All other components within normal limits  I-STAT CG4 LACTIC ACID, ED - Abnormal; Notable for the following components:   Lactic Acid, Venous 1.92 (*)    All other components within normal limits  CULTURE, BLOOD (ROUTINE X 2)  CULTURE, BLOOD (ROUTINE X 2)  URINE CULTURE  I-STAT CG4 LACTIC ACID, ED    EKG EKG Interpretation  Date/Time:  Saturday October 21 2017 03:27:39 EDT Ventricular Rate:  68 PR Interval:    QRS Duration: 95 QT Interval:  398 QTC Calculation: 424 R Axis:   32 Text Interpretation:  Sinus rhythm Normal ECG Confirmed by Orpah Greek [phone]) on 10/21/2017 3:45:00 AM   Radiology Dg Chest 2 View  Result Date: 10/21/2017 CLINICAL DATA:  Fever EXAM: CHEST - 2 VIEW COMPARISON:  02/08/2016 FINDINGS: Linear atelectasis or scar at the left base. No acute consolidation or effusion. Normal heart size. No pneumothorax. Degenerative  changes of the spine. IMPRESSION: No active  cardiopulmonary disease. Linear scarring or atelectasis at the left base. Electronically Signed   By: Donavan Foil M.D.   On: 10/21/2017 03:32    Procedures Procedures (including critical care time)  Medications Ordered in ED Medications  0.9 %  sodium chloride infusion (1,000 mLs Intravenous New Bag/Given 10/21/17 0328)  cefTRIAXone (ROCEPHIN) 2 g in sodium chloride 0.9 % 100 mL IVPB (2 g Intravenous New Bag/Given 10/21/17 0328)     Initial Impression / Assessment and Plan / ED Course  I have reviewed the triage vital signs and the nursing notes.  Pertinent labs & imaging results that were available during my care of the patient were reviewed by me and considered in my medical decision making (see chart for details).     Resents to the ER for evaluation of left-sided flank pain, urinary frequency and dysuria.  She has a history of recurrent urinary tract infection and sepsis secondary to UTI.  Patient is febrile with a leukocytosis.  This is consistent with early sepsis.  I have reviewed her urine cultures, she has grown E. coli that is sensitive to multiple agents in the past.  She was initiated on Rocephin.  She will be hospitalized.  I did briefly discuss the patient with Dr. Dayle Points, on-call for urology.  He felt that treating the patient with antibiotics was appropriate, does not need urologic consultation unless she worsens or does not respond to antibiotics.  He does, however, recommend renal ultrasound during hospitalization, as she has not had imaging performed recently.  CRITICAL CARE Performed by: Orpah Greek   Total critical care time: 30 minutes  Critical care time was exclusive of separately billable procedures and treating other patients.  Critical care was necessary to treat or prevent imminent or life-threatening deterioration.  Critical care was time spent personally by me on the following activities: development of treatment plan with patient and/or  surrogate as well as nursing, discussions with consultants, evaluation of patient's response to treatment, examination of patient, obtaining history from patient or surrogate, ordering and performing treatments and interventions, ordering and review of laboratory studies, ordering and review of radiographic studies, pulse oximetry and re-evaluation of patient's condition.   Final Clinical Impressions(s) / ED Diagnoses   Final diagnoses:  Sepsis, due to unspecified organism [facility])  Urinary tract infection without hematuria, site unspecified    ED Discharge Orders    None       Rochelle Larue, Gwenyth Allegra, MD 10/21/17 1478    Orpah Greek, MD 10/21/17 2956    Orpah Greek, MD 10/21/17 2130    Orpah Greek, MD 11/04/17 [phone]

## 2017-10-21 NOTE — ED Notes (Signed)
Patient transported to X-ray, unable to complete EKG at this time.

## 2017-10-21 NOTE — Consult Note (Signed)
Urology Consult Note   Requesting Attending Physician:  Patrecia Pour, Christean Grief, MD Service Providing Consult: Urology  Consulting Attending: Alinda Money   Reason for Consult:  Pyelonephritis, Chronic hydronephrosis  HPI: [name] is seen in consultation for reasons noted above at the request of Doreatha Lew, MD for evaluation of the above.  This is a 72 y.o. female with hx of chronic left hydronephrosis secondary to complications from an anterior lumbar fusion in 2009. Managed previously with balloon dilation and then ureterolysis in 2010 with Dr. Alinda Money. Renogram in 2016 revealed split function of L - 23% with T1/2 with partial obstruction at 22 minutes.   She has a hx of recurrent pyelonephritis. Recently admitted in 07/2017 with similar symptoms and clinical picture - treated conservatively with IV abx   Over the past 4 days, had left sided flank pain with dysuria. Low grade fevers at home - 100. Mild nausea. Minimal flank pain. No vomiting. Blood and urine cultures collected in the Ed   In the ED, temperature of 100.  Cr - 1.05 WBC - 17  U/A - large LE, rare bacteria, >50 RBC, WBC clumps, >50 WBC  Of note, has been on prophylactic TMP  Past Medical History: Past Medical History:  Diagnosis Date  . Arthritis   . Colon polyp, hyperplastic 04/27/2012   Colonoscopy 2010.  No adenomatous polyps   . Endometrioid adenocarcinoma   . History of endometrial cancer s/p TAH BSO June2013 06/15/2011   Grade 1 endometrioid adenocarcinoma with focal superficial myometrial invasion of 0.1 cm with a myometrium of 1.8 cm or approximately 7%. She had a 5-cm tumor. No lymphovascular space Involvement.    . Hyperlipidemia   . Hypertension   . Mass of in fold of jejunum s/p SB resection 07/19/2012 07/20/2012  . Obesity (BMI 30-39.9) 04/27/2012  . Pyelonephritis 02/2016  . 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 SBO (small bowel obstruction) -  recurrent 04/27/2012  . Ureteral adhesion, left 2010   s/p ureterolysis & stenting Dr. Larwance Sachs now  . UTI (urinary tract infection)     Past Surgical History:  Past Surgical History:  Procedure Laterality Date  . ABDOMINAL HYSTERECTOMY  11/02/10   TAH, BSO, lysis of adhesions for endometrial cancer  . [place], 2009   anterior L2-3, L3-4 arthrodesis  . BACK SURGERY  12/17/2012   Spinal fusion and repair   . BLADDER SUSPENSION  2003  . CATARACT EXTRACTION, BILATERAL  2003  . EYE SURGERY    . FOOT SURGERY  06/2010   left foot surgery  . HERNIA REPAIR  07/19/12   lap [place] repair  . INCISIONAL HERNIA REPAIR  11/02/10  . INSERTION OF MESH N/A 04/23/2013   Procedure: INSERTION OF MESH;  Surgeon: Adin Hector, MD;  Location: [facility];  Service: General;  Laterality: N/A;  . JOINT REPLACEMENT  09/2009   Right total knee  . KIDNEY SURGERY  2010   Left kidney  . LAPAROSCOPIC LYSIS OF ADHESIONS N/A 04/23/2013   Procedure: LAPAROSCOPIC LYSIS OF ADHESIONS;  Surgeon: Adin Hector, MD;  Location: [facility];  Service: General;  Laterality: N/A;  . TOTAL HIP ARTHROPLASTY  01/2010   Right total hip  . TOTAL HIP ARTHROPLASTY Left 07/04/2014   Procedure: LEFT TOTAL HIP ARTHROPLASTY ANTERIOR APPROACH;  Surgeon: Gearlean Alf, MD;  Location: [facility];  Service: Orthopedics;  Laterality: Left;  . TOTAL KNEE ARTHROPLASTY  right  . URETEROLYSIS  2010   lap/open left ureterolysis & omental flap, ureter stent  . VENTRAL HERNIA REPAIR N/A 07/19/2012   Procedure: LAPAROSCOPIC LYSIS OF ADHESIONS, SMALL BOWEL RESECTION, SEROSAL REPAIR, PRIMARY VENTRAL HERNIA REPAIR;  Surgeon: Adin Hector, MD;  Location: [facility];  Service: General;  Laterality: N/A;  . VENTRAL HERNIA REPAIR N/A 04/23/2013   Procedure: LAPAROSCOPIC exploration and repair of hernia in abdominal VENTRAL wall  HERNIA;  Surgeon: Adin Hector, MD;  Location: [facility];  Service: General;  Laterality: N/A;     Medication: Current Facility-Administered Medications  Medication Dose Route Frequency Provider Last Rate Last Dose  . 0.9 %  sodium chloride infusion  1,000 mL Intravenous Continuous Orpah Greek, MD 125 mL/hr at 10/21/17 0534 1,000 mL at 10/21/17 0534  . acetaminophen (TYLENOL) tablet 650 mg  650 mg Oral Q6H PRN Etta Quill, DO       Or  . acetaminophen (TYLENOL) suppository 650 mg  650 mg Rectal Q6H PRN Etta Quill, DO      . amitriptyline (ELAVIL) tablet 25 mg  25 mg Oral QHS Jennette Kettle M, DO      . cefTRIAXone (ROCEPHIN) 2 g in sodium chloride 0.9 % 100 mL IVPB  2 g Intravenous Q24H Orpah Greek, MD   Stopped at 10/21/17 0451  . enoxaparin (LOVENOX) injection 40 mg  40 mg Subcutaneous Daily Alcario Drought, Jared M, DO      . fluticasone [facility]) 50 MCG/ACT nasal spray 2 spray  2 spray Each Nare Daily Jennette Kettle M, DO      . gabapentin (NEURONTIN) capsule 300 mg  300 mg Oral QHS Etta Quill, DO      . HYDROcodone-acetaminophen (NORCO/VICODIN) 5-325 MG per tablet 1 tablet  1 tablet Oral Q6H PRN Etta Quill, DO      . methocarbamol (ROBAXIN) tablet 500 mg  500 mg Oral QID Etta Quill, DO        Allergies: Allergies  Allergen Reactions  . Cashew Nut Oil Hives  . Dog Epithelium Hives and Itching  . Ondansetron Hcl Nausea And Vomiting    "made me throw up"  . Sulfa Antibiotics     "blistering lips and mouth sores"  . Sulfamethoxazole     Mouth sores  . Zofran Hives and Nausea Only    Social History: Social History   Tobacco Use  . Smoking status: Never Smoker  . Smokeless tobacco: Never Used  Substance Use Topics  . Alcohol use: No  . Drug use: No    Family History Family History  Problem Relation Age of Onset  . Diabetes Mother   . Cancer Mother        lung  . Diabetes Father   . Cancer Father        liver  . Colon cancer Sister   . Diabetes Sister   . Cancer Sister        colon  . Breast cancer Neg Hx   .  Allergic rhinitis Neg Hx   . Angioedema Neg Hx   . Asthma Neg Hx   . Atopy Neg Hx   . Eczema Neg Hx   . Immunodeficiency Neg Hx   . Urticaria Neg Hx     Review of Systems 10 systems were reviewed and are negative except as noted specifically in the HPI.  Objective   Vital signs in last 24 hours: BP 130/65 (BP Location: Right Arm)  Pulse 69   Temp 99.9 F (37.7 C) (Oral)   Resp 14   Ht 5' (1.524 m)   Wt 80.7 kg (178 lb)   SpO2 100%   BMI 34.76 kg/m   Physical Exam General: NAD, A&O, resting, appropriate HEENT: [place]/AT, EOMI, MMM Pulmonary: Normal work of breathing Cardiovascular: HDS, adequate peripheral perfusion Abdomen: Soft, NTTP, nondistended, . GU: no left CVA tenderness Extremities: warm and well perfused Neuro: Appropriate, no focal neurological deficits  Most Recent Labs: Lab Results  Component Value Date   WBC 17.1 (H) 10/21/2017   HGB 14.5 10/21/2017   HCT 42.4 10/21/2017   PLT 333 10/21/2017    Lab Results  Component Value Date   NA 137 10/21/2017   K 4.2 10/21/2017   CL 103 10/21/2017   CO2 24 10/21/2017   BUN 15 10/21/2017   CREATININE 1.05 (H) 10/21/2017   CALCIUM 9.6 10/21/2017   MG 1.9 07/22/2012    Lab Results  Component Value Date   INR 1.15 02/08/2016   APTT 33 02/08/2016     IMAGING: Dg Chest 2 View  Result Date: 10/21/2017 CLINICAL DATA:  Fever EXAM: CHEST - 2 VIEW COMPARISON:  02/08/2016 FINDINGS: Linear atelectasis or scar at the left base. No acute consolidation or effusion. Normal heart size. No pneumothorax. Degenerative changes of the spine. IMPRESSION: No active cardiopulmonary disease. Linear scarring or atelectasis at the left base. Electronically Signed   By: Donavan Foil M.D.   On: 10/21/2017 03:32    ------  Assessment:  72 y.o. female with hx of left chronic hydronephrosis 2/2 complication from prior surgery requiring dilation and formal ureterolysis. Lasix scan shows 23% split function with partial  obstruction. Propensity for pyelonephritis, treated in the past conservatively.   Currently low grade fever, vitals othewise stable. WBC 17. U/A concerning for infection. Non toxic and pain much improved   Recommendations: - Conservative management at this time, IV abx and hydration  - Follow up urine and blood culture and tailor abx as able  - Agree with RUS to ensure no significant worsening in hydronephrosis  - Will require outpatient follow up to discuss further options to limit bouts of pyelonephritis.    Thank you for this consult. Please contact the urology consult pager with any further questions/concerns.

## 2017-10-21 NOTE — Progress Notes (Signed)
PROGRESS NOTE [facility]   [name]   LPF:[id number] DOB: 11/22/1945  DOA: 10/21/2017 PCP: [name], MD   Brief Narrative:  [name] is a 72 year old female with medical history significant for HTN, endometrial adenocarcinoma status post TAH/BSO, recurrent UTIs and chronic left-sided hydronephrosis presented to the emergency department complaining of left sided flank pain associated with nausea and dysuria.  Upon ED evaluation UA grossly abnormal, concerning of UTI, low-grade fever, elevated WBC.  Patient was admitted with working diagnosis of pyelonephritis.  Subjective: Patient seen and examined, complaining of flank pain, however improved from yesterday.  Denies nausea and vomiting.  Still with some dysuria.  Afebrile  Assessment & Plan: Pyelonephritis in setting of recurrent UTIs History of chronic left hydronephrosis, she is on TMP prophylaxis at home. Agree with IV antibiotics, follow-up urine culture and de-escalate pending results. Urology recommendations appreciated, continue supportive treatment Renal ultrasound shows chronic left hydronephrosis, not worsening. May need cranberry supplement or vaginal estrogen.  Hypertension BP stable Resume lisinopril/thiazide, renal function at baseline Continue to monitor  CKD stage III Creatinine at baseline. Monitor renal function in a.m.  DVT prophylaxis: Lovenox Code Status: Full code Family Communication: None at bedside Disposition Plan: Home in 1 to 2 days when cultures resulted  Consultants:   Urology  Procedures:   None  Antimicrobials:  Rocephin 6/15   Objective: Vitals:   10/21/17 0330 10/21/17 0400 10/21/17 0430 10/21/17 0520  BP: 130/60 133/62 134/62 130/65  Pulse: 67 68 71 69  Resp: 12 (!) 22 14 14   Temp:    99.9 F (37.7 C)  TempSrc:    Oral  SpO2: 96% 98% 99% 100%  Weight:    80.7 kg (178 lb)  Height:    5' (1.524 m)    Intake/Output Summary (Last 24 hours)  at 10/21/2017 1220 Last data filed at 10/21/2017 0600 Gross per 24 hour  Intake 416.67 ml  Output -  Net 416.67 ml   Filed Weights   10/20/17 2345 10/21/17 0520  Weight: 80.7 kg (178 lb) 80.7 kg (178 lb)    Examination:  General exam: Appears calm and comfortable  HEENT: AC/AT, PERRLA, OP moist and clear Respiratory system: Clear to auscultation. No wheezes,crackle or rhonchi Cardiovascular system: S1 & S2 heard, RRR. No JVD, murmurs, rubs or gallops Gastrointestinal system: Abdomen is nondistended, soft and nontender. +CVA tenderness on the lft Normal bowel sounds heard. Central nervous system: Alert and oriented. No focal neurological deficits. Extremities: No pedal edema.  Skin: No rashes Psychiatry: Mood & affect appropriate.   Data Reviewed: I have personally reviewed following labs and imaging studies  CBC: Recent Labs  Lab 10/21/17 0016  WBC 17.1*  NEUTROABS 14.4*  HGB 14.5  HCT 42.4  MCV 94.0  PLT 532   Basic Metabolic Panel: Recent Labs  Lab 10/21/17 0016  NA 137  K 4.2  CL 103  CO2 24  GLUCOSE 166*  BUN 15  CREATININE 1.05*  CALCIUM 9.6   GFR: Estimated Creatinine Clearance: 45.6 mL/min (A) (by C-G formula based on SCr of 1.05 mg/dL (H)). Liver Function Tests: Recent Labs  Lab 10/21/17 0016  AST 23  ALT 20  ALKPHOS 62  BILITOT 1.3*  PROT 7.5  ALBUMIN 4.5   No results for input(s): LIPASE, AMYLASE in the last 168 hours. No results for input(s): AMMONIA in the last 168 hours. Coagulation Profile: No results for input(s): INR, PROTIME in the last 168 hours. Cardiac Enzymes: No results for  input(s): CKTOTAL, CKMB, CKMBINDEX, TROPONINI in the last 168 hours. BNP (last 3 results) No results for input(s): PROBNP in the last 8760 hours. HbA1C: No results for input(s): HGBA1C in the last 72 hours. CBG: No results for input(s): GLUCAP in the last 168 hours. Lipid Profile: No results for input(s): CHOL, HDL, LDLCALC, TRIG, CHOLHDL, LDLDIRECT in  the last 72 hours. Thyroid Function Tests: No results for input(s): TSH, T4TOTAL, FREET4, T3FREE, THYROIDAB in the last 72 hours. Anemia Panel: No results for input(s): VITAMINB12, FOLATE, FERRITIN, TIBC, IRON, RETICCTPCT in the last 72 hours. Sepsis Labs: Recent Labs  Lab 10/21/17 0023 10/21/17 0254  LATICACIDVEN 1.92* 1.75    Recent Results (from the past 240 hour(s))  Blood Culture (routine x 2)     Status: None (Preliminary result)   Collection Time: 10/21/17  3:21 AM  Result Value Ref Range Status   Specimen Description BLOOD RIGHT HAND  Final   Special Requests   Final    BOTTLES DRAWN AEROBIC ONLY Blood Culture adequate volume Performed at [facility] [address] [place], [place] [address]    Culture PENDING  Incomplete   Report Status PENDING  Incomplete      Radiology Studies: Dg Chest 2 View  Result Date: 10/21/2017 CLINICAL DATA:  Fever EXAM: CHEST - 2 VIEW COMPARISON:  02/08/2016 FINDINGS: Linear atelectasis or scar at the left base. No acute consolidation or effusion. Normal heart size. No pneumothorax. Degenerative changes of the spine. IMPRESSION: No active cardiopulmonary disease. Linear scarring or atelectasis at the left base. Electronically Signed   By: Donavan Foil M.D.   On: 10/21/2017 03:32   US Renal  Result Date: 10/21/2017 CLINICAL DATA:  72 year old female with a history of urinary tract infection and left flank pain EXAM: RENAL / URINARY TRACT ULTRASOUND COMPLETE COMPARISON:  CT 08/01/2017, CT 02/08/2016 FINDINGS: Right Kidney: Length: 12.8 cm. Echogenicity similar to that of the adjacent parenchyma of the liver. No evidence of right-sided hydronephrosis. Flow confirmed in the right kidney hilum. Left Kidney: Length: 12.0 cm. Persisting hydronephrosis of the left kidney which was present on comparison CT scans dating to 02/08/2016. Bladder: Appears normal for degree of bladder distention. IMPRESSION: Persisting left-sided  hydronephrosis, which has been present on comparison CT scans dating to 02/08/2016. No evidence of right-sided hydronephrosis. Electronically Signed   By: Corrie Mckusick D.O.   On: 10/21/2017 09:47    Scheduled Meds: . amitriptyline  25 mg Oral QHS  . enoxaparin (LOVENOX) injection  40 mg Subcutaneous Daily  . fluticasone  2 spray Each Nare Daily  . gabapentin  300 mg Oral QHS  . methocarbamol  500 mg Oral QID   Continuous Infusions: . sodium chloride 1,000 mL (10/21/17 0534)  . cefTRIAXone (ROCEPHIN)  IV Stopped (10/21/17 0451)     LOS: 0 days    Time spent: Total of 35 minutes spent with pt, greater than 50% of which was spent in discussion of  treatment, counseling and coordination of care   Chipper Oman, MD Pager: Text Page via [email or web address]   If 7PM-7AM, please contact night-coverage [email or web address] 10/21/2017, 12:20 PM   Note - This record has been created using [employer]. Chart creation errors have been sought, but may not always have been located. Such creation errors do not reflect on the standard of medical care.

## 2017-10-21 NOTE — H&P (Signed)
History and Physical    [name] EVO:[id number] DOB: 24-May-1945 DOA: 10/21/2017  PCP: Whitney Maize, MD  Patient coming from: Home  I have personally briefly reviewed patient's old medical records in [place]  Chief Complaint: UTI  HPI: [name] is a 72 y.o. female with medical history significant of endometrial adenocarcinoma s/p TAH/BSO, prior SBOs, L ureteral adhesion causing chronic L hydronephrosis.  Frequent recurrent UTIs causing recurrent L pyelonephritis.  Patient last admitted for UTI and L pyelonephritis in March.  Treated with ABx at that time.  Patient presents to the ED with several days of not feeling well.  Then today she started having L sided flank pain that she usually gets with UTI and L pyelonephritis.  Mild nausea, no vomiting.  Has had dysuria.   ED Course: UA confirms UTI.  Patient started on rocephin.   Review of Systems: As per HPI otherwise 10 point review of systems negative.   Past Medical History:  Diagnosis Date  . Arthritis   . Colon polyp, hyperplastic 04/27/2012   Colonoscopy 2010.  No adenomatous polyps   . Endometrioid adenocarcinoma   . History of endometrial cancer s/p TAH BSO June2013 06/15/2011   Grade 1 endometrioid adenocarcinoma with focal superficial myometrial invasion of 0.1 cm with a myometrium of 1.8 cm or approximately 7%. She had a 5-cm tumor. No lymphovascular space Involvement.    . Hyperlipidemia   . Hypertension   . Mass of in fold of jejunum s/p SB resection 07/19/2012 07/20/2012  . Obesity (BMI 30-39.9) 04/27/2012  . Pyelonephritis 02/2016  . 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 SBO (small bowel obstruction) - recurrent 04/27/2012  . Ureteral adhesion, left 2010   s/p ureterolysis & stenting Dr. Larwance Sachs now  . UTI (urinary tract infection)     Past Surgical History:  Procedure Laterality Date  . ABDOMINAL HYSTERECTOMY  11/02/10     TAH, BSO, lysis of adhesions for endometrial cancer  . [place], 2009   anterior L2-3, L3-4 arthrodesis  . BACK SURGERY  12/17/2012   Spinal fusion and repair   . BLADDER SUSPENSION  2003  . CATARACT EXTRACTION, BILATERAL  2003  . EYE SURGERY    . FOOT SURGERY  06/2010   left foot surgery  . HERNIA REPAIR  07/19/12   lap [place] repair  . INCISIONAL HERNIA REPAIR  11/02/10  . INSERTION OF MESH N/A 04/23/2013   Procedure: INSERTION OF MESH;  Surgeon: Adin Hector, MD;  Location: [facility];  Service: General;  Laterality: N/A;  . JOINT REPLACEMENT  09/2009   Right total knee  . KIDNEY SURGERY  2010   Left kidney  . LAPAROSCOPIC LYSIS OF ADHESIONS N/A 04/23/2013   Procedure: LAPAROSCOPIC LYSIS OF ADHESIONS;  Surgeon: Adin Hector, MD;  Location: [facility];  Service: General;  Laterality: N/A;  . TOTAL HIP ARTHROPLASTY  01/2010   Right total hip  . TOTAL HIP ARTHROPLASTY Left 07/04/2014   Procedure: LEFT TOTAL HIP ARTHROPLASTY ANTERIOR APPROACH;  Surgeon: Gearlean Alf, MD;  Location: [facility];  Service: Orthopedics;  Laterality: Left;  . TOTAL KNEE ARTHROPLASTY     right  . URETEROLYSIS  2010   lap/open left ureterolysis & omental flap, ureter stent  . VENTRAL HERNIA REPAIR N/A 07/19/2012   Procedure: LAPAROSCOPIC LYSIS OF ADHESIONS, SMALL BOWEL RESECTION, SEROSAL REPAIR, PRIMARY VENTRAL HERNIA REPAIR;  Surgeon: Adin Hector, MD;  Location: [facility];  Service: General;  Laterality: N/A;  . VENTRAL HERNIA REPAIR N/A 04/23/2013   Procedure: LAPAROSCOPIC exploration and repair of hernia in abdominal VENTRAL wall  HERNIA;  Surgeon: Adin Hector, MD;  Location: [facility];  Service: General;  Laterality: N/A;     reports that she has never smoked. She has never used smokeless tobacco. She reports that she does not drink alcohol or use drugs.  Allergies  Allergen Reactions  . Cashew Nut Oil Hives  . Ondansetron Hcl   . Sulfa Antibiotics   . Sulfamethoxazole     Mouth sores  .  Zofran Hives and Nausea Only    Family History  Problem Relation Age of Onset  . Diabetes Mother   . Cancer Mother        lung  . Diabetes Father   . Cancer Father        liver  . Colon cancer Sister   . Diabetes Sister   . Cancer Sister        colon  . Breast cancer Neg Hx   . Allergic rhinitis Neg Hx   . Angioedema Neg Hx   . Asthma Neg Hx   . Atopy Neg Hx   . Eczema Neg Hx   . Immunodeficiency Neg Hx   . Urticaria Neg Hx      Prior to Admission medications   Medication Sig Start Date End Date Taking? Authorizing Provider  amitriptyline (ELAVIL) 25 MG tablet Take 1 tablet (25 mg total) by mouth at bedtime. 09/21/17   Whitney Maize, MD  fexofenadine [facility] ALLERGY) 180 MG tablet Take 1 tablet (180 mg total) by mouth daily. Patient not taking: Reported on 09/21/2017 07/31/17   Whitney Norlander, DO  fluticasone (FLONASE) 50 MCG/ACT nasal spray Place 2 sprays into both nostrils daily.    [provider]  gabapentin (NEURONTIN) 300 MG capsule Take 2 capsules (600 mg total) by mouth at bedtime. 06/21/17   Whitney Maize, MD  lisinopril-hydrochlorothiazide (PRINZIDE,ZESTORETIC) 10-12.5 MG tablet Take 1 tablet by mouth daily. 09/24/17   Whitney Maize, MD  methocarbamol (ROBAXIN) 500 MG tablet Take 500 mg by mouth 4 (four) times daily.    [provider]  Sennosides (EX-LAX PO) Take 2-4 tablets by mouth daily as needed (CONSTIPATION).    [provider]    Physical Exam: Vitals:   10/21/17 0221 10/21/17 0230 10/21/17 0300 10/21/17 0330  BP: 139/69 130/64 121/64 130/60  Pulse: 83 77 72 67  Resp: 18   12  Temp: 100.3 F (37.9 C)     TempSrc: Oral     SpO2: 96% 92% 93% 96%  Weight:      Height:        Constitutional: NAD, calm, comfortable Eyes: PERRL, lids and conjunctivae normal ENMT: Mucous membranes are moist. Posterior pharynx clear of any exudate or lesions.Normal dentition.  Neck: normal, supple, no masses, no  thyromegaly Respiratory: clear to auscultation bilaterally, no wheezing, no crackles. Normal respiratory effort. No accessory muscle use.  Cardiovascular: Regular rate and rhythm, no murmurs / rubs / gallops. No extremity edema. 2+ pedal pulses. No carotid bruits.  Abdomen: no tenderness, no masses palpated. No hepatosplenomegaly. Bowel sounds positive.  Musculoskeletal: no clubbing / cyanosis. No joint deformity upper and lower extremities. Good ROM, no contractures. Normal muscle tone.  Skin: no rashes, lesions, ulcers. No induration Neurologic: CN 2-12 grossly intact. Sensation intact, DTR normal. Strength 5/5 in all 4.  Psychiatric: Normal  judgment and insight. Alert and oriented x 3. Normal mood.    Labs on Admission: I have personally reviewed following labs and imaging studies  CBC: Recent Labs  Lab 10/21/17 0016  WBC 17.1*  NEUTROABS 14.4*  HGB 14.5  HCT 42.4  MCV 94.0  PLT 660   Basic Metabolic Panel: Recent Labs  Lab 10/21/17 0016  NA 137  K 4.2  CL 103  CO2 24  GLUCOSE 166*  BUN 15  CREATININE 1.05*  CALCIUM 9.6   GFR: Estimated Creatinine Clearance: 45.6 mL/min (A) (by C-G formula based on SCr of 1.05 mg/dL (H)). Liver Function Tests: Recent Labs  Lab 10/21/17 0016  AST 23  ALT 20  ALKPHOS 62  BILITOT 1.3*  PROT 7.5  ALBUMIN 4.5   No results for input(s): LIPASE, AMYLASE in the last 168 hours. No results for input(s): AMMONIA in the last 168 hours. Coagulation Profile: No results for input(s): INR, PROTIME in the last 168 hours. Cardiac Enzymes: No results for input(s): CKTOTAL, CKMB, CKMBINDEX, TROPONINI in the last 168 hours. BNP (last 3 results) No results for input(s): PROBNP in the last 8760 hours. HbA1C: No results for input(s): HGBA1C in the last 72 hours. CBG: No results for input(s): GLUCAP in the last 168 hours. Lipid Profile: No results for input(s): CHOL, HDL, LDLCALC, TRIG, CHOLHDL, LDLDIRECT in the last 72 hours. Thyroid  Function Tests: No results for input(s): TSH, T4TOTAL, FREET4, T3FREE, THYROIDAB in the last 72 hours. Anemia Panel: No results for input(s): VITAMINB12, FOLATE, FERRITIN, TIBC, IRON, RETICCTPCT in the last 72 hours. Urine analysis:    Component Value Date/Time   COLORURINE YELLOW 10/21/2017 0013   APPEARANCEUR CLOUDY (A) 10/21/2017 0013   APPEARANCEUR Turbid (A) 08/22/2017 1001   LABSPEC 1.018 10/21/2017 0013   PHURINE 5.0 10/21/2017 0013   GLUCOSEU NEGATIVE 10/21/2017 0013   HGBUR MODERATE (A) 10/21/2017 0013   BILIRUBINUR NEGATIVE 10/21/2017 0013   BILIRUBINUR Negative 08/22/2017 1001   KETONESUR NEGATIVE 10/21/2017 0013   PROTEINUR 100 (A) 10/21/2017 0013   UROBILINOGEN negative 09/30/2014 1049   UROBILINOGEN 0.2 07/07/2014 0537   NITRITE NEGATIVE 10/21/2017 0013   LEUKOCYTESUR LARGE (A) 10/21/2017 0013   LEUKOCYTESUR 3+ (A) 08/22/2017 1001    Radiological Exams on Admission: Dg Chest 2 View  Result Date: 10/21/2017 CLINICAL DATA:  Fever EXAM: CHEST - 2 VIEW COMPARISON:  02/08/2016 FINDINGS: Linear atelectasis or scar at the left base. No acute consolidation or effusion. Normal heart size. No pneumothorax. Degenerative changes of the spine. IMPRESSION: No active cardiopulmonary disease. Linear scarring or atelectasis at the left base. Electronically Signed   By: Donavan Foil M.D.   On: 10/21/2017 03:32    EKG: Independently reviewed.  Assessment/Plan Principal Problem:   Pyelonephritis Active Problems:   Hypertension   Recurrent UTI   Left flank pain   History of hydronephrosis    1. Pyelonephritis, recurrent UTIs - 1. In setting of known L hydronephrosis that is chronic 2. Rocephin 3. Cultures pending 4. Trend WBC 5. IVF 6. US renal 7. Low thresh-hold to call urology if it gets worse, but treat medically per now per Dr. Alinda Money 2. HTN - holding home BP meds  DVT prophylaxis: Lovenox Code Status: Full Family Communication: No family in room Disposition  Plan: Home when clinically better Consults called: EDP spoke with Dr. Alinda Money with urology who saw her last admit Admission status: Admit to inpatient   Etta Quill DO [facility] Pager [phone] Only works nights!  If 7AM-7PM, please contact the primary day team physician taking care of patient  [email or web address] Password [facility]  10/21/2017, 4:23 AM

## 2017-10-22 DIAGNOSIS — N390 Urinary tract infection, site not specified: Secondary | ICD-10-CM

## 2017-10-22 DIAGNOSIS — A4151 Sepsis due to Escherichia coli [E. coli]: Secondary | ICD-10-CM

## 2017-10-22 DIAGNOSIS — R109 Unspecified abdominal pain: Secondary | ICD-10-CM

## 2017-10-22 DIAGNOSIS — N1330 Unspecified hydronephrosis: Secondary | ICD-10-CM

## 2017-10-22 DIAGNOSIS — A419 Sepsis, unspecified organism: Secondary | ICD-10-CM

## 2017-10-22 DIAGNOSIS — Z87448 Personal history of other diseases of urinary system: Secondary | ICD-10-CM

## 2017-10-22 DIAGNOSIS — I10 Essential (primary) hypertension: Secondary | ICD-10-CM

## 2017-10-22 LAB — CBC WITH DIFFERENTIAL/PLATELET
Basophils Absolute: 0 10*3/uL (ref 0.0–0.1)
Basophils Relative: 0 %
EOS ABS: 0.1 10*3/uL (ref 0.0–0.7)
Eosinophils Relative: 1 %
HCT: 37.4 % (ref 36.0–46.0)
Hemoglobin: 12.6 g/dL (ref 12.0–15.0)
LYMPHS ABS: 2.7 10*3/uL (ref 0.7–4.0)
LYMPHS PCT: 18 %
MCH: 32 pg (ref 26.0–34.0)
MCHC: 33.7 g/dL (ref 30.0–36.0)
MCV: 94.9 fL (ref 78.0–100.0)
MONOS PCT: 8 %
Monocytes Absolute: 1.2 10*3/uL — ABNORMAL HIGH (ref 0.1–1.0)
Neutro Abs: 11.3 10*3/uL — ABNORMAL HIGH (ref 1.7–7.7)
Neutrophils Relative %: 73 %
PLATELETS: 257 10*3/uL (ref 150–400)
RBC: 3.94 MIL/uL (ref 3.87–5.11)
RDW: 13.2 % (ref 11.5–15.5)
WBC: 15.3 10*3/uL — AB (ref 4.0–10.5)

## 2017-10-22 LAB — BASIC METABOLIC PANEL
Anion gap: 9 (ref 5–15)
BUN: 9 mg/dL (ref 6–20)
CHLORIDE: 105 mmol/L (ref 101–111)
CO2: 26 mmol/L (ref 22–32)
CREATININE: 0.89 mg/dL (ref 0.44–1.00)
Calcium: 8.5 mg/dL — ABNORMAL LOW (ref 8.9–10.3)
GFR calc Af Amer: >60 mL/min (ref >60)
GFR calc non Af Amer: >60 mL/min (ref >60)
Glucose, Bld: 114 mg/dL — ABNORMAL HIGH (ref 65–99)
Potassium: 4.1 mmol/L (ref 3.5–5.1)
SODIUM: 140 mmol/L (ref 135–145)

## 2017-10-22 LAB — MAGNESIUM: MAGNESIUM: 1.8 mg/dL (ref 1.7–2.4)

## 2017-10-22 NOTE — Progress Notes (Addendum)
PROGRESS NOTE [facility]   [name]   IOE:[id number] DOB: 06/14/1945  DOA: 10/21/2017 PCP: [name], MD   Brief Narrative:  [name] is a 72 year old female with medical history significant for HTN, endometrial adenocarcinoma status post TAH/BSO, recurrent UTIs and chronic left-sided hydronephrosis presented to the emergency department complaining of left sided flank pain associated with nausea and dysuria.  Upon ED evaluation UA grossly abnormal, concerning of UTI, low-grade fever, elevated WBC.  Patient was admitted with working diagnosis of pyelonephritis.  Subjective: Patient seen and examined, she is doing much better.  Denies abdominal pain or dysuria.  Assessment & Plan: Sepsis due to Pyelonephritis in setting of recurrent UTIs History of chronic left hydronephrosis, she is on TMP prophylaxis at home. Continue Rocephin, cultures growing E. coli.  Await sensitivities. Urology was consulted no indication for urological procedure at this time. Renal ultrasound shows chronic left hydronephrosis, not worsening. Sepsis physiology resolved  May need cranberry supplement or vaginal estrogen. OOB as tolerated   Bacteremia E.Coli  Source - Urine  Continue Rocephin for now await susceptibilities and narrow antibiotic therapy. Will treat with antibiotics for total of 10 days. No need to repeat blood cultures, as patient has significantly improved.  Hypertension BP P remains stable Continue current regimen  CKD stage III Stable creatinine at baseline  DVT prophylaxis: Lovenox Code Status: Full code Family Communication: None at bedside Disposition Plan: Home in a.m. when sensitivities are resulted.  Consultants:   Urology  Procedures:   None  Antimicrobials:  Rocephin 6/15   Objective: Vitals:   10/21/17 0430 10/21/17 0520 10/21/17 1324 10/22/17 0312  BP: 134/62 130/65 (!) 106/54 (!) 130/59  Pulse: 71 69 70 87  Resp: 14 14 15 20     Temp:  99.9 F (37.7 C) 97.8 F (36.6 C) 99.9 F (37.7 C)  TempSrc:  Oral Oral Oral  SpO2: 99% 100% 96% 93%  Weight:  80.7 kg (178 lb)    Height:  5' (1.524 m)      Intake/Output Summary (Last 24 hours) at 10/22/2017 1002 Last data filed at 10/22/2017 0314 Gross per 24 hour  Intake 1180.83 ml  Output 1050 ml  Net 130.83 ml   Filed Weights   10/20/17 2345 10/21/17 0520  Weight: 80.7 kg (178 lb) 80.7 kg (178 lb)    Examination:  General: NAD Cardiovascular: RRR, S1/S2 +, no rubs, no gallops Respiratory: CTA bilaterally, no wheezing, no rhonchi Abdominal: Soft, NT, ND, bowel sounds + Extremities: no edema, no cyanosis  Data Reviewed: I have personally reviewed following labs and imaging studies  CBC: Recent Labs  Lab 10/21/17 0016 10/22/17 0540  WBC 17.1* 15.3*  NEUTROABS 14.4* 11.3*  HGB 14.5 12.6  HCT 42.4 37.4  MCV 94.0 94.9  PLT 333 182   Basic Metabolic Panel: Recent Labs  Lab 10/21/17 0016 10/22/17 0540  NA 137 140  K 4.2 4.1  CL 103 105  CO2 24 26  GLUCOSE 166* 114*  BUN 15 9  CREATININE 1.05* 0.89  CALCIUM 9.6 8.5*  MG  --  1.8   GFR: Estimated Creatinine Clearance: 53.8 mL/min (by C-G formula based on SCr of 0.89 mg/dL). Liver Function Tests: Recent Labs  Lab 10/21/17 0016  AST 23  ALT 20  ALKPHOS 62  BILITOT 1.3*  PROT 7.5  ALBUMIN 4.5   No results for input(s): LIPASE, AMYLASE in the last 168 hours. No results for input(s): AMMONIA in the last 168 hours. Coagulation Profile:  No results for input(s): INR, PROTIME in the last 168 hours. Cardiac Enzymes: No results for input(s): CKTOTAL, CKMB, CKMBINDEX, TROPONINI in the last 168 hours. BNP (last 3 results) No results for input(s): PROBNP in the last 8760 hours. HbA1C: No results for input(s): HGBA1C in the last 72 hours. CBG: No results for input(s): GLUCAP in the last 168 hours. Lipid Profile: No results for input(s): CHOL, HDL, LDLCALC, TRIG, CHOLHDL, LDLDIRECT in the last  72 hours. Thyroid Function Tests: No results for input(s): TSH, T4TOTAL, FREET4, T3FREE, THYROIDAB in the last 72 hours. Anemia Panel: No results for input(s): VITAMINB12, FOLATE, FERRITIN, TIBC, IRON, RETICCTPCT in the last 72 hours. Sepsis Labs: Recent Labs  Lab 10/21/17 0023 10/21/17 0254  LATICACIDVEN 1.92* 1.75    Recent Results (from the past 240 hour(s))  Urine Culture     Status: Abnormal (Preliminary result)   Collection Time: 10/21/17  2:57 AM  Result Value Ref Range Status   Specimen Description   Final    URINE, RANDOM Performed at [facility] [address], [place], [place] [address]    Special Requests   Final    NONE Performed at [facility], [place] [address], [place], [place] [address]    Culture >=100,000 COLONIES/mL GRAM NEGATIVE RODS (A)  Final   Report Status PENDING  Incomplete  Blood Culture (routine x 2)     Status: None (Preliminary result)   Collection Time: 10/21/17  3:21 AM  Result Value Ref Range Status   Specimen Description BLOOD RIGHT HAND  Final   Special Requests   Final    BOTTLES DRAWN AEROBIC ONLY Blood Culture adequate volume Performed at [facility] [address] [place], [place] [address]    Culture PENDING  Incomplete   Report Status PENDING  Incomplete  Blood Culture (routine x 2)     Status: Abnormal (Preliminary result)   Collection Time: 10/21/17  3:21 AM  Result Value Ref Range Status   Specimen Description   Final    BLOOD LEFT ANTECUBITAL Performed at [facility] [address], [place], [place] [address]    Special Requests   Final    BOTTLES DRAWN AEROBIC ONLY Blood Culture adequate volume Performed at [facility] [address], [place], [place] [address]    Culture  Setup Time   Final    GRAM NEGATIVE RODS AEROBIC BOTTLE ONLY CRITICAL RESULT CALLED TO, READ BACK BY AND VERIFIED WITH: Sanda Klein AT 0102 10/21/17  BY L BENFIELD Performed at [facility], [place] [address], [place],  [address]    Culture ESCHERICHIA COLI (A)  Final   Report Status PENDING  Incomplete  Blood Culture ID Panel (Reflexed)     Status: Abnormal   Collection Time: 10/21/17  3:21 AM  Result Value Ref Range Status   Enterococcus species NOT DETECTED NOT DETECTED Final   Listeria monocytogenes NOT DETECTED NOT DETECTED Final   Staphylococcus species NOT DETECTED NOT DETECTED Final   Staphylococcus aureus NOT DETECTED NOT DETECTED Final   Streptococcus species NOT DETECTED NOT DETECTED Final   Streptococcus agalactiae NOT DETECTED NOT DETECTED Final   Streptococcus pneumoniae NOT DETECTED NOT DETECTED Final   Streptococcus pyogenes NOT DETECTED NOT DETECTED Final   Acinetobacter baumannii NOT DETECTED NOT DETECTED Final   Enterobacteriaceae species DETECTED (A) NOT DETECTED Final    Comment: Enterobacteriaceae represent a large family of gram-negative bacteria, not a single organism. CRITICAL  RESULT CALLED TO, READ BACK BY AND VERIFIED WITH: M BELL,PHARMD AT 1858 10/21/17 BY L BENFIELD    Enterobacter cloacae complex NOT DETECTED NOT DETECTED Final   Escherichia coli DETECTED (A) NOT DETECTED Final    Comment: CRITICAL RESULT CALLED TO, READ BACK BY AND VERIFIED WITH: M BELL,PHARMD AT 1858 10/21/17 BY L BENFIELD    Klebsiella oxytoca NOT DETECTED NOT DETECTED Final   Klebsiella pneumoniae NOT DETECTED NOT DETECTED Final   Proteus species NOT DETECTED NOT DETECTED Final   Serratia marcescens NOT DETECTED NOT DETECTED Final   Carbapenem resistance NOT DETECTED NOT DETECTED Final   Haemophilus influenzae NOT DETECTED NOT DETECTED Final   Neisseria meningitidis NOT DETECTED NOT DETECTED Final   Pseudomonas aeruginosa NOT DETECTED NOT DETECTED Final   Candida albicans NOT DETECTED NOT DETECTED Final   Candida glabrata NOT DETECTED NOT DETECTED Final   Candida krusei NOT DETECTED NOT DETECTED Final   Candida  parapsilosis NOT DETECTED NOT DETECTED Final   Candida tropicalis NOT DETECTED NOT DETECTED Final    Comment: Performed at [facility], [place] [address] [place], [place] [address]      Radiology Studies: Dg Chest 2 View  Result Date: 10/21/2017 CLINICAL DATA:  Fever EXAM: CHEST - 2 VIEW COMPARISON:  02/08/2016 FINDINGS: Linear atelectasis or scar at the left base. No acute consolidation or effusion. Normal heart size. No pneumothorax. Degenerative changes of the spine. IMPRESSION: No active cardiopulmonary disease. Linear scarring or atelectasis at the left base. Electronically Signed   By: Donavan Foil M.D.   On: 10/21/2017 03:32   US Renal  Result Date: 10/21/2017 CLINICAL DATA:  72 year old female with a history of urinary tract infection and left flank pain EXAM: RENAL / URINARY TRACT ULTRASOUND COMPLETE COMPARISON:  CT 08/01/2017, CT 02/08/2016 FINDINGS: Right Kidney: Length: 12.8 cm. Echogenicity similar to that of the adjacent parenchyma of the liver. No evidence of right-sided hydronephrosis. Flow confirmed in the right kidney hilum. Left Kidney: Length: 12.0 cm. Persisting hydronephrosis of the left kidney which was present on comparison CT scans dating to 02/08/2016. Bladder: Appears normal for degree of bladder distention. IMPRESSION: Persisting left-sided hydronephrosis, which has been present on comparison CT scans dating to 02/08/2016. No evidence of right-sided hydronephrosis. Electronically Signed   By: Corrie Mckusick D.O.   On: 10/21/2017 09:47    Scheduled Meds: . amitriptyline  25 mg Oral QHS  . enoxaparin (LOVENOX) injection  40 mg Subcutaneous Daily  . fluticasone  2 spray Each Nare Daily  . gabapentin  300 mg Oral QHS  . lisinopril  10 mg Oral Daily   And  . hydrochlorothiazide  12.5 mg Oral Daily  . methocarbamol  500 mg Oral QID   Continuous Infusions: . sodium chloride 1,000 mL (10/22/17 0308)  . cefTRIAXone (ROCEPHIN)  IV 2 g (10/22/17 0307)     LOS: 1  day    Time spent: Total of 35 minutes spent with pt, greater than 50% of which was spent in discussion of  treatment, counseling and coordination of care   Chipper Oman, MD Pager: Text Page via [email or web address]   If 7PM-7AM, please contact night-coverage [email or web address] 10/22/2017, 10:02 AM   Note - This record has been created using [employer]. Chart creation errors have been sought, but may not always have been located. Such creation errors do not reflect on the standard of medical care.

## 2017-10-22 NOTE — Progress Notes (Signed)
Patient ID: [name], female   DOB: [birth year], 72 y.o.   MRN: [id number]    Subjective: Pt feeling better.  No flank or abdominal pain today.  Objective: Vital signs in last 24 hours: Temp:  [97.8 F (36.6 C)-99.9 F (37.7 C)] 99.9 F (37.7 C) (06/16 0312) Pulse Rate:  [70-87] 87 (06/16 0312) Resp:  [15-20] 20 (06/16 0312) BP: (106-130)/(54-59) 130/59 (06/16 0312) SpO2:  [93 %-96 %] 93 % (06/16 0312)  Intake/Output from previous day: 06/15 0701 - 06/16 0700 In: 1180.8 [P.O.:180; I.V.:977.5; IV Piggyback:23.3] Out: 1050 [Urine:1050] Intake/Output this shift: No intake/output data recorded.  Physical Exam:  General: Alert and oriented Abd: No CVAT, minimal left abdominal tenderness  Lab Results: Recent Labs    10/21/17 0016 10/22/17 0540  HGB 14.5 12.6  HCT 42.4 37.4   CBC Latest Ref Rng & Units 10/22/2017 10/21/2017 08/03/2017  WBC 4.0 - 10.5 K/uL 15.3(H) 17.1(H) 13.1(H)  Hemoglobin 12.0 - 15.0 g/dL 12.6 14.5 12.7  Hematocrit 36.0 - 46.0 % 37.4 42.4 38.2  Platelets 150 - 400 K/uL 257 333 295     BMET Recent Labs    10/21/17 0016 10/22/17 0540  NA 137 140  K 4.2 4.1  CL 103 105  CO2 24 26  GLUCOSE 166* 114*  BUN 15 9  CREATININE 1.05* 0.89  CALCIUM 9.6 8.5*     Studies/Results: Blood culture with prelim Enterobacter and E coli  Assessment/Plan: Left pyelonephritis with hydronephrosis: Continue IV antibiotic therapy pending final cultures.  No indication for urologic intervention in absence of complete obstruction.  Will follow.   LOS: 1 day   Chee Kinslow,LES 10/22/2017, 7:28 AM

## 2017-10-23 LAB — CBC WITH DIFFERENTIAL/PLATELET
Basophils Absolute: 0 10*3/uL (ref 0.0–0.1)
Basophils Relative: 0 %
Eosinophils Absolute: 0.3 10*3/uL (ref 0.0–0.7)
Eosinophils Relative: 3 %
HCT: 39 % (ref 36.0–46.0)
HEMOGLOBIN: 13 g/dL (ref 12.0–15.0)
LYMPHS ABS: 2.6 10*3/uL (ref 0.7–4.0)
LYMPHS PCT: 30 %
MCH: 31.6 pg (ref 26.0–34.0)
MCHC: 33.3 g/dL (ref 30.0–36.0)
MCV: 94.9 fL (ref 78.0–100.0)
Monocytes Absolute: 0.6 10*3/uL (ref 0.1–1.0)
Monocytes Relative: 7 %
Neutro Abs: 5.1 10*3/uL (ref 1.7–7.7)
Neutrophils Relative %: 60 %
PLATELETS: 277 10*3/uL (ref 150–400)
RBC: 4.11 MIL/uL (ref 3.87–5.11)
RDW: 13.1 % (ref 11.5–15.5)
WBC: 8.6 10*3/uL (ref 4.0–10.5)

## 2017-10-23 LAB — URINE CULTURE

## 2017-10-23 LAB — CULTURE, BLOOD (ROUTINE X 2): SPECIAL REQUESTS: ADEQUATE

## 2017-10-23 MED ORDER — CEFPODOXIME PROXETIL 200 MG PO TABS: 200.0000 mg | ORAL_TABLET | Freq: Two times a day (BID) | ORAL | 0 refills | Status: AC

## 2017-10-23 NOTE — Progress Notes (Signed)
Patient ID: [name], female   DOB: Apr 13, 1946, 72 y.o.   MRN: [id number]    Subjective: Pt without complaints.  Objective: Vital signs in last 24 hours: Temp:  [97.5 F (36.4 C)-98.4 F (36.9 C)] 98.4 F (36.9 C) (06/17 0605) Pulse Rate:  [51-68] 55 (06/17 0605) Resp:  [17-18] 18 (06/17 0605) BP: (110-117)/(46-57) 117/46 (06/17 0605) SpO2:  [95 %-97 %] 97 % (06/17 0605)  Intake/Output from previous day: 06/16 0701 - 06/17 0700 In: 460 [P.O.:360; IV Piggyback:100] Out: -  Intake/Output this shift: No intake/output data recorded.  Physical Exam:  General: Alert and oriented   Lab Results: Recent Labs    10/21/17 0016 10/22/17 0540 10/23/17 0535  HGB 14.5 12.6 13.0  HCT 42.4 37.4 39.0   CBC Latest Ref Rng & Units 10/23/2017 10/22/2017 10/21/2017  WBC 4.0 - 10.5 K/uL 8.6 15.3(H) 17.1(H)  Hemoglobin 12.0 - 15.0 g/dL 13.0 12.6 14.5  Hematocrit 36.0 - 46.0 % 39.0 37.4 42.4  Platelets 150 - 400 K/uL 277 257 333     BMET Recent Labs    10/21/17 0016 10/22/17 0540  NA 137 140  K 4.2 4.1  CL 103 105  CO2 24 26  GLUCOSE 166* 114*  BUN 15 9  CREATININE 1.05* 0.89  CALCIUM 9.6 8.5*     Studies/Results: Urine and blood cultures positive for E coli resistant to fluoroquinolones and TMP/SMX  Assessment/Plan: She has been discharged on cefpodoxime.  I will schedule her for f/u in our office in about 2 weeks to consider alternative options for recurrent UTI prevention.  We may want to switch her prophylaxis from TMP and will discuss vaginal estrogens and cranberry supplementation as options.  If she continues to have breakthrough pyelonephritis, we may need to consider chronic stent management.   LOS: 2 days   Drezden Seitzinger,LES 10/23/2017, 10:43 AM

## 2017-10-23 NOTE — Discharge Summary (Signed)
Physician Discharge Summary  [name]  ZOX:[id number]  DOB: [birth year]  DOA: 10/21/2017 PCP: [name], MD  Admit date: 10/21/2017 Discharge date: 10/23/2017  Admitted From: Home  Disposition: Home   Recommendations for Outpatient Follow-up:  1. Follow up with PCP in 1 week   2. Please obtain BMP in one week to monitor renal function 3. Follow-up with urology  Discharge Condition: Stable CODE STATUS: Full code Diet recommendation: Heart Healthy   Brief/Interim Summary: For full details see H&P/Progress note, but in brief, [name] is a 72 year old female with medical history significant for HTN, endometrial adenocarcinoma status post TAH/BSO, recurrent UTIs and chronic left-sided hydronephrosis presented to the emergency department complaining of left sided flank pain associated with nausea and dysuria.  Upon ED evaluation UA grossly abnormal, concerning of UTI, low-grade fever, elevated WBC.  Patient was admitted with working diagnosis of pyelonephritis.  Subjective: Patient seen and examined, she continues to do well.  No nausea vomiting or diarrhea.  Denies dysuria and abdominal pain.  Tolerating diet well. remains afebrile  Discharge Diagnoses/Hospital Course:  Sepsis due to Pyelonephritis in setting of recurrent UTIs History of chronic left hydronephrosis, she is on TMP prophylaxis at home. Continue Rocephin, cultures growing E. coli. Sensitive to rocephin, will discharge on Vantin to complete total of 10 days of abx therapy. Urology was consulted no indication for urological procedure at this time. Renal ultrasound shows chronic left hydronephrosis, not worsening. Sepsis physiology resolved  Recommend cranberry supplements.   Bacteremia E.Coli  Source - Urine  Sensitive to Rocephin, will treat with Vantin for total of 10 days of abx therapy. No need to repeat blood cultures.   Hypertension BP stable during hospital stay  Continue home medications  with no changes   CKD stage III Stable creatinine at baseline  All other chronic medical condition were stable during the hospitalization.  On the day of the discharge the patient's vitals were stable, and no other acute medical condition were reported by patient. the patient was felt safe to be discharge to home.   Discharge Instructions  You were cared for by a hospitalist during your hospital stay. If you have any questions about your discharge medications or the care you received while you were in the hospital after you are discharged, you can call the unit and asked to speak with the hospitalist on call if the hospitalist that took care of you is not available. Once you are discharged, your primary care physician will handle any further medical issues. Please note that NO REFILLS for any discharge medications will be authorized once you are discharged, as it is imperative that you return to your primary care physician (or establish a relationship with a primary care physician if you do not have one) for your aftercare needs so that they can reassess your need for medications and monitor your lab values.  Discharge Instructions    Call MD for:  difficulty breathing, headache or visual disturbances   Complete by:  As directed    Call MD for:  extreme fatigue   Complete by:  As directed    Call MD for:  hives   Complete by:  As directed    Call MD for:  persistant dizziness or light-headedness   Complete by:  As directed    Call MD for:  persistant nausea and vomiting   Complete by:  As directed    Call MD for:  redness, tenderness, or signs of infection (pain, swelling,  redness, odor or green/yellow discharge around incision site)   Complete by:  As directed    Call MD for:  severe uncontrolled pain   Complete by:  As directed    Call MD for:  temperature >100.4   Complete by:  As directed    Diet - low sodium heart healthy   Complete by:  As directed    Increase activity slowly    Complete by:  As directed      Allergies as of 10/23/2017      Reactions   Cashew Nut Oil Hives   Dog Epithelium Hives, Itching   Ondansetron Hcl Nausea And Vomiting   "made me throw up"   Sulfa Antibiotics    "blistering lips and mouth sores"   Sulfamethoxazole    Mouth sores   Zofran Hives, Nausea Only      Medication List    TAKE these medications   amitriptyline 25 MG tablet Commonly known as:  ELAVIL Take 1 tablet (25 mg total) by mouth at bedtime.   cefpodoxime 200 MG tablet Commonly known as:  VANTIN Take 1 tablet (200 mg total) by mouth 2 (two) times daily for 8 days.   EX-LAX PO Take 2-4 tablets by mouth daily as needed (CONSTIPATION).   fexofenadine 180 MG tablet Commonly known as:  ALLEGRA ALLERGY Take 1 tablet (180 mg total) by mouth daily.   fluticasone 50 MCG/ACT nasal spray Commonly known as:  FLONASE Place 2 sprays into both nostrils daily.   gabapentin 300 MG capsule Commonly known as:  NEURONTIN Take 2 capsules (600 mg total) by mouth at bedtime.   lisinopril-hydrochlorothiazide 10-12.5 MG tablet Commonly known as:  PRINZIDE,ZESTORETIC Take 1 tablet by mouth daily.   methocarbamol 500 MG tablet Commonly known as:  ROBAXIN Take 500 mg by mouth 4 (four) times daily.      Follow-up Information    [name], MD. Schedule an appointment as soon as possible for a visit in 1 week(s).   Specialty:  Pediatrics Why:  Hospital follow up  Contact information: [place] [address] [phone]          Allergies  Allergen Reactions  . Cashew Nut Oil Hives  . Dog Epithelium Hives and Itching  . Ondansetron Hcl Nausea And Vomiting    "made me throw up"  . Sulfa Antibiotics     "blistering lips and mouth sores"  . Sulfamethoxazole     Mouth sores  . Zofran Hives and Nausea Only    Consultations:  Urology    Procedures/Studies: Dg Chest 2 View  Result Date: 10/21/2017 CLINICAL DATA:  Fever EXAM: CHEST - 2 VIEW  COMPARISON:  02/08/2016 FINDINGS: Linear atelectasis or scar at the left base. No acute consolidation or effusion. Normal heart size. No pneumothorax. Degenerative changes of the spine. IMPRESSION: No active cardiopulmonary disease. Linear scarring or atelectasis at the left base. Electronically Signed   By: Donavan Foil M.D.   On: 10/21/2017 03:32   US Renal  Result Date: 10/21/2017 CLINICAL DATA:  72 year old female with a history of urinary tract infection and left flank pain EXAM: RENAL / URINARY TRACT ULTRASOUND COMPLETE COMPARISON:  CT 08/01/2017, CT 02/08/2016 FINDINGS: Right Kidney: Length: 12.8 cm. Echogenicity similar to that of the adjacent parenchyma of the liver. No evidence of right-sided hydronephrosis. Flow confirmed in the right kidney hilum. Left Kidney: Length: 12.0 cm. Persisting hydronephrosis of the left kidney which was present on comparison CT scans dating to  02/08/2016. Bladder: Appears normal for degree of bladder distention. IMPRESSION: Persisting left-sided hydronephrosis, which has been present on comparison CT scans dating to 02/08/2016. No evidence of right-sided hydronephrosis. Electronically Signed   By: Corrie Mckusick D.O.   On: 10/21/2017 09:47    Discharge Exam: Vitals:   10/22/17 2140 10/23/17 0605  BP: (!) 111/56 (!) 117/46  Pulse: (!) 51 (!) 55  Resp: 18 18  Temp: 97.9 F (36.6 C) 98.4 F (36.9 C)  SpO2: 95% 97%   Vitals:   10/22/17 0312 10/22/17 1315 10/22/17 2140 10/23/17 0605  BP: (!) 130/59 (!) 110/57 (!) 111/56 (!) 117/46  Pulse: 87 68 (!) 51 (!) 55  Resp: 20 17 18 18   Temp: 99.9 F (37.7 C) (!) 97.5 F (36.4 C) 97.9 F (36.6 C) 98.4 F (36.9 C)  TempSrc: Oral Oral Oral Oral  SpO2: 93% 96% 95% 97%  Weight:      Height:        General: Pt is alert, awake, not in acute distress Cardiovascular: RRR, S1/S2 +, no rubs, no gallops Respiratory: CTA bilaterally, no wheezing, no rhonchi Abdominal: Soft, NT, ND, bowel sounds + Extremities: no  edema, no cyanosis   The results of significant diagnostics from this hospitalization (including imaging, microbiology, ancillary and laboratory) are listed below for reference.     Microbiology: Recent Results (from the past 240 hour(s))  Urine Culture     Status: Abnormal   Collection Time: 10/21/17  2:57 AM  Result Value Ref Range Status   Specimen Description   Final    URINE, RANDOM Performed at [facility] [address] [place], [place] [address]    Special Requests   Final    NONE Performed at [facility], [place] [address], [place], [place] [address]    Culture >=100,000 COLONIES/mL ESCHERICHIA COLI (A)  Final   Report Status 10/23/2017 FINAL  Final   Organism ID, Bacteria ESCHERICHIA COLI (A)  Final      Susceptibility   Escherichia coli - MIC*    AMPICILLIN 16 INTERMEDIATE Intermediate     CEFAZOLIN <=4 SENSITIVE Sensitive     CEFTRIAXONE <=1 SENSITIVE Sensitive     CIPROFLOXACIN >=4 RESISTANT Resistant     GENTAMICIN <=1 SENSITIVE Sensitive     IMIPENEM <=0.25 SENSITIVE Sensitive     NITROFURANTOIN <=16 SENSITIVE Sensitive     TRIMETH/SULFA >=320 RESISTANT Resistant     AMPICILLIN/SULBACTAM 4 SENSITIVE Sensitive     PIP/TAZO <=4 SENSITIVE Sensitive     Extended ESBL NEGATIVE Sensitive     * >=100,000 COLONIES/mL ESCHERICHIA COLI  Blood Culture (routine x 2)     Status: None (Preliminary result)   Collection Time: 10/21/17  3:21 AM  Result Value Ref Range Status   Specimen Description BLOOD RIGHT HAND  Final   Special Requests   Final    BOTTLES DRAWN AEROBIC ONLY Blood Culture adequate volume Performed at [facility] [address] [place], [place] [address]    Culture   Final    NO GROWTH 1 DAY Performed at [facility], [place] [address] [place], [place] [address]    Report Status PENDING  Incomplete  Blood Culture (routine x 2)     Status: Abnormal   Collection Time: 10/21/17  3:21 AM   Result Value Ref Range Status   Specimen Description   Final    BLOOD LEFT ANTECUBITAL Performed at [facility]  [address] [place], [place] [address]    Special Requests   Final    BOTTLES DRAWN AEROBIC ONLY Blood Culture adequate volume Performed at [facility] [address], [place], [place] [address]    Culture  Setup Time   Final    GRAM NEGATIVE RODS AEROBIC BOTTLE ONLY CRITICAL RESULT CALLED TO, READ BACK BY AND VERIFIED WITH: Sanda Klein AT 6962 10/21/17 BY L BENFIELD Performed at [facility], [place] [address], [place], [place] [address]    Culture ESCHERICHIA COLI (A)  Final   Report Status 10/23/2017 FINAL  Final   Organism ID, Bacteria ESCHERICHIA COLI  Final      Susceptibility   Escherichia coli - MIC*    AMPICILLIN 16 INTERMEDIATE Intermediate     CEFAZOLIN <=4 SENSITIVE Sensitive     CEFEPIME <=1 SENSITIVE Sensitive     CEFTAZIDIME <=1 SENSITIVE Sensitive     CEFTRIAXONE <=1 SENSITIVE Sensitive     CIPROFLOXACIN >=4 RESISTANT Resistant     GENTAMICIN <=1 SENSITIVE Sensitive     IMIPENEM <=0.25 SENSITIVE Sensitive     TRIMETH/SULFA >=320 RESISTANT Resistant     AMPICILLIN/SULBACTAM 4 SENSITIVE Sensitive     PIP/TAZO <=4 SENSITIVE Sensitive     Extended ESBL NEGATIVE Sensitive     * ESCHERICHIA COLI  Blood Culture ID Panel (Reflexed)     Status: Abnormal   Collection Time: 10/21/17  3:21 AM  Result Value Ref Range Status   Enterococcus species NOT DETECTED NOT DETECTED Final   Listeria monocytogenes NOT DETECTED NOT DETECTED Final   Staphylococcus species NOT DETECTED NOT DETECTED Final   Staphylococcus aureus NOT DETECTED NOT DETECTED Final   Streptococcus species NOT DETECTED NOT DETECTED Final   Streptococcus agalactiae NOT DETECTED NOT DETECTED Final   Streptococcus pneumoniae NOT DETECTED NOT DETECTED Final   Streptococcus pyogenes NOT DETECTED NOT DETECTED Final   Acinetobacter baumannii NOT DETECTED  NOT DETECTED Final   Enterobacteriaceae species DETECTED (A) NOT DETECTED Final    Comment: Enterobacteriaceae represent a large family of gram-negative bacteria, not a single organism. CRITICAL RESULT CALLED TO, READ BACK BY AND VERIFIED WITH: M BELL,PHARMD AT 1858 10/21/17 BY L BENFIELD    Enterobacter cloacae complex NOT DETECTED NOT DETECTED Final   Escherichia coli DETECTED (A) NOT DETECTED Final    Comment: CRITICAL RESULT CALLED TO, READ BACK BY AND VERIFIED WITH: M BELL,PHARMD AT 1858 10/21/17 BY L BENFIELD    Klebsiella oxytoca NOT DETECTED NOT DETECTED Final   Klebsiella pneumoniae NOT DETECTED NOT DETECTED Final   Proteus species NOT DETECTED NOT DETECTED Final   Serratia marcescens NOT DETECTED NOT DETECTED Final   Carbapenem resistance NOT DETECTED NOT DETECTED Final   Haemophilus influenzae NOT DETECTED NOT DETECTED Final   Neisseria meningitidis NOT DETECTED NOT DETECTED Final   Pseudomonas aeruginosa NOT DETECTED NOT DETECTED Final   Candida albicans NOT DETECTED NOT DETECTED Final   Candida glabrata NOT DETECTED NOT DETECTED Final   Candida krusei NOT DETECTED NOT DETECTED Final   Candida parapsilosis NOT DETECTED NOT DETECTED Final   Candida tropicalis NOT DETECTED NOT DETECTED Final    Comment: Performed at [facility], [place] [address] [place],  [address]     Labs: BNP (last 3 results) No results for input(s): BNP in the last 8760 hours. Basic Metabolic Panel: Recent Labs  Lab 10/21/17 0016 10/22/17 0540  NA 137 140  K 4.2 4.1  CL 103 105  CO2 24 26  GLUCOSE 166* 114*  BUN 15 9  CREATININE 1.05* 0.89  CALCIUM 9.6 8.5*  MG  --  1.8   Liver Function Tests: Recent Labs  Lab 10/21/17 0016  AST 23  ALT 20  ALKPHOS 62  BILITOT 1.3*  PROT 7.5  ALBUMIN 4.5   No results for input(s): LIPASE, AMYLASE in the last 168 hours. No results for input(s): AMMONIA in the last 168 hours. CBC: Recent Labs  Lab 10/21/17 0016 10/22/17 0540  10/23/17 0535  WBC 17.1* 15.3* 8.6  NEUTROABS 14.4* 11.3* 5.1  HGB 14.5 12.6 13.0  HCT 42.4 37.4 39.0  MCV 94.0 94.9 94.9  PLT 333 257 277   Cardiac Enzymes: No results for input(s): CKTOTAL, CKMB, CKMBINDEX, TROPONINI in the last 168 hours. BNP: Invalid input(s): POCBNP CBG: No results for input(s): GLUCAP in the last 168 hours. D-Dimer No results for input(s): DDIMER in the last 72 hours. Hgb A1c No results for input(s): HGBA1C in the last 72 hours. Lipid Profile No results for input(s): CHOL, HDL, LDLCALC, TRIG, CHOLHDL, LDLDIRECT in the last 72 hours. Thyroid function studies No results for input(s): TSH, T4TOTAL, T3FREE, THYROIDAB in the last 72 hours.  Invalid input(s): FREET3 Anemia work up No results for input(s): VITAMINB12, FOLATE, FERRITIN, TIBC, IRON, RETICCTPCT in the last 72 hours. Urinalysis    Component Value Date/Time   COLORURINE YELLOW 10/21/2017 0013   APPEARANCEUR CLOUDY (A) 10/21/2017 0013   APPEARANCEUR Turbid (A) 08/22/2017 1001   LABSPEC 1.018 10/21/2017 0013   PHURINE 5.0 10/21/2017 0013   GLUCOSEU NEGATIVE 10/21/2017 0013   HGBUR MODERATE (A) 10/21/2017 0013   BILIRUBINUR NEGATIVE 10/21/2017 0013   BILIRUBINUR Negative 08/22/2017 1001   KETONESUR NEGATIVE 10/21/2017 0013   PROTEINUR 100 (A) 10/21/2017 0013   UROBILINOGEN negative 09/30/2014 1049   UROBILINOGEN 0.2 07/07/2014 0537   NITRITE NEGATIVE 10/21/2017 0013   LEUKOCYTESUR LARGE (A) 10/21/2017 0013   LEUKOCYTESUR 3+ (A) 08/22/2017 1001   Sepsis Labs Invalid input(s): PROCALCITONIN,  WBC,  LACTICIDVEN Microbiology Recent Results (from the past 240 hour(s))  Urine Culture     Status: Abnormal   Collection Time: 10/21/17  2:57 AM  Result Value Ref Range Status   Specimen Description   Final    URINE, RANDOM Performed at [facility], [place] [address] [place], [place] [address]    Special Requests   Final    NONE Performed at [facility],  [place] [address] [place], [place] [address]    Culture >=100,000 COLONIES/mL ESCHERICHIA COLI (A)  Final   Report Status 10/23/2017 FINAL  Final   Organism ID, Bacteria ESCHERICHIA COLI (A)  Final      Susceptibility   Escherichia coli - MIC*    AMPICILLIN 16 INTERMEDIATE Intermediate     CEFAZOLIN <=4 SENSITIVE Sensitive     CEFTRIAXONE <=1 SENSITIVE Sensitive     CIPROFLOXACIN >=4 RESISTANT Resistant     GENTAMICIN <=1 SENSITIVE Sensitive     IMIPENEM <=0.25 SENSITIVE Sensitive     NITROFURANTOIN <=16 SENSITIVE Sensitive     TRIMETH/SULFA >=320 RESISTANT Resistant     AMPICILLIN/SULBACTAM 4 SENSITIVE Sensitive     PIP/TAZO <=4 SENSITIVE Sensitive     Extended ESBL NEGATIVE Sensitive     * >=100,000 COLONIES/mL ESCHERICHIA COLI  Blood Culture (routine x 2)     Status: None (Preliminary result)   Collection Time: 10/21/17  3:21 AM  Result Value Ref Range Status   Specimen Description BLOOD RIGHT HAND  Final   Special Requests   Final    BOTTLES DRAWN AEROBIC ONLY Blood Culture adequate volume Performed at [facility] [address] [place], [place] [address]    Culture   Final    NO GROWTH 1 DAY Performed at [facility], [place] [address] [place], [place] [address]    Report Status PENDING  Incomplete  Blood Culture (routine x 2)     Status: Abnormal   Collection Time: 10/21/17  3:21 AM  Result Value Ref Range Status   Specimen Description   Final    BLOOD LEFT ANTECUBITAL Performed at [facility] [address] [place], [place] [address]    Special Requests   Final    BOTTLES DRAWN AEROBIC ONLY Blood Culture adequate volume Performed at [facility] [address], [place], [place] [address]    Culture  Setup Time   Final    GRAM NEGATIVE RODS AEROBIC BOTTLE ONLY CRITICAL RESULT CALLED TO, READ BACK BY AND VERIFIED WITH: Sanda Klein AT 3664 10/21/17 BY L BENFIELD Performed at [facility],  [place] [address], [place], [place] [address]    Culture ESCHERICHIA COLI (A)  Final   Report Status 10/23/2017 FINAL  Final   Organism ID, Bacteria ESCHERICHIA COLI  Final      Susceptibility   Escherichia coli - MIC*    AMPICILLIN 16 INTERMEDIATE Intermediate     CEFAZOLIN <=4 SENSITIVE Sensitive     CEFEPIME <=1 SENSITIVE Sensitive     CEFTAZIDIME <=1 SENSITIVE Sensitive     CEFTRIAXONE <=1 SENSITIVE Sensitive     CIPROFLOXACIN >=4 RESISTANT Resistant     GENTAMICIN <=1 SENSITIVE Sensitive     IMIPENEM <=0.25 SENSITIVE Sensitive     TRIMETH/SULFA >=320 RESISTANT Resistant     AMPICILLIN/SULBACTAM 4 SENSITIVE Sensitive     PIP/TAZO <=4 SENSITIVE Sensitive     Extended ESBL NEGATIVE Sensitive     * ESCHERICHIA COLI  Blood Culture ID Panel (Reflexed)     Status: Abnormal   Collection Time: 10/21/17  3:21 AM  Result Value Ref Range Status   Enterococcus species NOT DETECTED NOT DETECTED Final   Listeria monocytogenes NOT DETECTED NOT DETECTED Final   Staphylococcus species NOT DETECTED NOT DETECTED Final   Staphylococcus aureus NOT DETECTED NOT DETECTED Final   Streptococcus species NOT DETECTED NOT DETECTED Final   Streptococcus agalactiae NOT DETECTED NOT DETECTED Final   Streptococcus pneumoniae NOT DETECTED NOT DETECTED Final   Streptococcus pyogenes NOT DETECTED NOT DETECTED Final   Acinetobacter baumannii NOT DETECTED NOT DETECTED Final   Enterobacteriaceae species DETECTED (A) NOT DETECTED Final    Comment: Enterobacteriaceae represent a large family of gram-negative bacteria, not a single organism. CRITICAL RESULT CALLED TO, READ BACK BY AND VERIFIED WITH: M BELL,PHARMD AT 1858 10/21/17 BY L BENFIELD    Enterobacter cloacae complex NOT DETECTED NOT DETECTED Final   Escherichia coli DETECTED (A) NOT DETECTED Final    Comment: CRITICAL RESULT CALLED TO, READ BACK BY AND VERIFIED WITH: M BELL,PHARMD AT 1858 10/21/17 BY L BENFIELD    Klebsiella oxytoca NOT DETECTED NOT DETECTED  Final   Klebsiella pneumoniae NOT DETECTED NOT DETECTED Final   Proteus species NOT DETECTED NOT DETECTED Final   Serratia marcescens NOT DETECTED NOT DETECTED Final   Carbapenem resistance NOT DETECTED NOT DETECTED Final   Haemophilus influenzae NOT DETECTED NOT DETECTED Final   Neisseria meningitidis NOT DETECTED NOT DETECTED Final  Pseudomonas aeruginosa NOT DETECTED NOT DETECTED Final   Candida albicans NOT DETECTED NOT DETECTED Final   Candida glabrata NOT DETECTED NOT DETECTED Final   Candida krusei NOT DETECTED NOT DETECTED Final   Candida parapsilosis NOT DETECTED NOT DETECTED Final   Candida tropicalis NOT DETECTED NOT DETECTED Final    Comment: Performed at [facility], [place] [address], [place], [place] [address]    Time coordinating discharge: 35 minutes  SIGNED:  Chipper Oman, MD  [facility] 10/23/2017, 10:05 AM  Pager please text page via  [email or web address]  Note - This record has been created using [employer]. Chart creation errors have been sought, but may not always have been located. Such creation errors do not reflect on the standard of medical care.

## 2017-10-23 NOTE — Progress Notes (Signed)
Patient has discharged to home on 10/23/17. Discharge instruction including medication and appointment was given to patient. Patient has no question at this time.

## 2017-10-25 ENCOUNTER — Telehealth: Payer: Self-pay | Admitting: *Deleted

## 2017-10-25 NOTE — Telephone Encounter (Signed)
Call Completed and Appointment Scheduled: Yes, Date: 10/30/17 with Dr Emiliano Dyer INFORMATION Date of Discharge:10/23/17   Discharge Facility: [facility]  Principal Discharge Diagnosis: Sepsis  Patient and/or caregiver is knowledgeable of his/her condition(s) and treatment: Yes  MEDICATION RECONCILIATION Current medication list reviewed with patient:Yes  Outpatient Encounter Medications as of 10/25/2017  Medication Sig  . amitriptyline (ELAVIL) 25 MG tablet Take 1 tablet (25 mg total) by mouth at bedtime.  . cefpodoxime (VANTIN) 200 MG tablet Take 1 tablet (200 mg total) by mouth 2 (two) times daily for 8 days.  . fexofenadine (ALLEGRA ALLERGY) 180 MG tablet Take 1 tablet (180 mg total) by mouth daily.  . fluticasone (FLONASE) 50 MCG/ACT nasal spray Place 2 sprays into both nostrils daily.  Marland Kitchen gabapentin (NEURONTIN) 300 MG capsule Take 2 capsules (600 mg total) by mouth at bedtime.  Marland Kitchen lisinopril-hydrochlorothiazide (PRINZIDE,ZESTORETIC) 10-12.5 MG tablet Take 1 tablet by mouth daily.  . methocarbamol (ROBAXIN) 500 MG tablet Take 500 mg by mouth 4 (four) times daily.  . Sennosides (EX-LAX PO) Take 2-4 tablets by mouth daily as needed (CONSTIPATION).   No facility-administered encounter medications on file as of 10/25/2017.     Discharge Medications reviewed and reconciled with current medications.yes  Patient is able to obtain needed medications:Yes  ACTIVITIES OF DAILY LIVING  Is the patient able to perform his/her own ADLs: Yes.    Patient is receiving home health services: No.  PATIENT EDUCATION Questions/Concerns Discussed: No concerns at this time. Patient reports feeling much better than she did in the hospital.   Chong Sicilian, RN

## 2017-10-26 LAB — CULTURE, BLOOD (ROUTINE X 2)
CULTURE: NO GROWTH
SPECIAL REQUESTS: ADEQUATE

## 2017-10-30 ENCOUNTER — Ambulatory Visit (INDEPENDENT_AMBULATORY_CARE_PROVIDER_SITE_OTHER): Payer: Medicare HMO | Admitting: Pediatrics

## 2017-10-30 ENCOUNTER — Encounter: Payer: Self-pay | Admitting: Pediatrics

## 2017-10-30 VITALS — BP 136/76 | HR 66 | Temp 98.3°F | Ht 60.0 in | Wt 179.6 lb

## 2017-10-30 DIAGNOSIS — N183 Chronic kidney disease, stage 3 unspecified: Secondary | ICD-10-CM

## 2017-10-30 DIAGNOSIS — I10 Essential (primary) hypertension: Secondary | ICD-10-CM | POA: Diagnosis not present

## 2017-10-30 DIAGNOSIS — R7881 Bacteremia: Secondary | ICD-10-CM | POA: Diagnosis not present

## 2017-10-30 DIAGNOSIS — N12 Tubulo-interstitial nephritis, not specified as acute or chronic: Secondary | ICD-10-CM

## 2017-10-30 LAB — BASIC METABOLIC PANEL
BUN / CREAT RATIO: 13 (ref 12–28)
BUN: 10 mg/dL (ref 8–27)
CO2: 25 mmol/L (ref 20–29)
Calcium: 9.4 mg/dL (ref 8.7–10.3)
Chloride: 99 mmol/L (ref 96–106)
Creatinine, Ser: 0.78 mg/dL (ref 0.57–1.00)
GFR calc Af Amer: 88 mL/min/{1.73_m2} (ref >59)
GFR calc non Af Amer: 76 mL/min/{1.73_m2} (ref >59)
Glucose: 100 mg/dL — ABNORMAL HIGH (ref 65–99)
Potassium: 4.6 mmol/L (ref 3.5–5.2)
SODIUM: 139 mmol/L (ref 134–144)

## 2017-10-30 NOTE — Progress Notes (Signed)
  Subjective:   Patient ID: [name], female    DOB: 03/17/1946, 72 y.o.   MRN: [id number] CC: Transitonal Care Management  HPI: [name] is a 72 y.o. female   Today's visit is for Transitional Care Management.  The patient was discharged from [facility] on 6/17 with a primary diagnosis of sepsis due to pyelonephritis in the setting of recurrent UTIs and bacteremia due to E. coli.   Contact with the patient and/or caregiver, by a clinical staff member, was made on 6/19 and was documented as a telephone encounter within the EMR.  Through chart review and discussion with the patient I have determined that management of their condition is of moderate complexity.   Since leaving the hospital she has been feeling much better.  She was given 10 days of Cefpodoxime, she will restart prophylactic TMP.   She says her appetite is been good.  Emptying her bladder regularly.  No fevers.  She is tolerating the antibiotic well.  She has follow-up scheduled with urology coming up.   Relevant past medical, surgical, family and social history reviewed. Allergies and medications reviewed and updated. Social History   Tobacco Use  Smoking Status Never Smoker  Smokeless Tobacco Never Used   ROS: All systems negative other than what is in the HPI  Objective:    BP 136/76   Pulse 66   Temp 98.3 F (36.8 C) (Oral)   Ht 5' (1.524 m)   Wt 179 lb 9.6 oz (81.5 kg)   BMI 35.08 kg/m   Wt Readings from Last 3 Encounters:  10/30/17 179 lb 9.6 oz (81.5 kg)  10/21/17 178 lb (80.7 kg)  09/21/17 178 lb 9.6 oz (81 kg)    Gen: NAD, alert, cooperative with exam, NCAT EYES: EOMI, no conjunctival injection, or no icterus CV: NRRR, normal S1/S2, no murmur, distal pulses 2+ b/l Resp: CTABL, no wheezes, normal WOB Abd: +BS, soft, NTND.  Ext: No edema, warm Neuro: Alert and oriented MSK: no point tenderness over right shoulder.  Normal range of motion bilateral shoulders.  Assessment &  Plan:  Zaniyah was seen today for transitonal care management.  Diagnoses and all orders for this visit:  Pyelonephritis Symptoms resolved.  Almost done with oral antibiotic.  Has follow-up coming up with urology.  She will restart prophylactic TMP after she finishes treatment abx. -     Basic Metabolic Panel  Bacteremia Symptoms resolved, finishing antibiotic as above.  Essential hypertension Stable, continue current medicines.  CKD stage III -     Basic Metabolic Panel  Follow up plan: Return in about 5 months (around 03/24/2018). [name], MD [place]

## 2017-11-06 DIAGNOSIS — M461 Sacroiliitis, not elsewhere classified: Secondary | ICD-10-CM | POA: Diagnosis not present

## 2017-11-06 DIAGNOSIS — Z79899 Other long term (current) drug therapy: Secondary | ICD-10-CM | POA: Diagnosis not present

## 2017-11-06 DIAGNOSIS — R69 Illness, unspecified: Secondary | ICD-10-CM | POA: Diagnosis not present

## 2017-11-06 DIAGNOSIS — M7071 Other bursitis of hip, right hip: Secondary | ICD-10-CM | POA: Diagnosis not present

## 2017-11-06 DIAGNOSIS — M47818 Spondylosis without myelopathy or radiculopathy, sacral and sacrococcygeal region: Secondary | ICD-10-CM | POA: Diagnosis not present

## 2017-11-06 DIAGNOSIS — M7918 Myalgia, other site: Secondary | ICD-10-CM | POA: Diagnosis not present

## 2017-11-06 DIAGNOSIS — M47816 Spondylosis without myelopathy or radiculopathy, lumbar region: Secondary | ICD-10-CM | POA: Diagnosis not present

## 2017-11-13 DIAGNOSIS — H5200 Hypermetropia, unspecified eye: Secondary | ICD-10-CM | POA: Diagnosis not present

## 2017-11-13 DIAGNOSIS — E7800 Pure hypercholesterolemia, unspecified: Secondary | ICD-10-CM | POA: Diagnosis not present

## 2017-11-13 DIAGNOSIS — I10 Essential (primary) hypertension: Secondary | ICD-10-CM | POA: Diagnosis not present

## 2017-11-17 ENCOUNTER — Ambulatory Visit (INDEPENDENT_AMBULATORY_CARE_PROVIDER_SITE_OTHER): Payer: Medicare HMO | Admitting: Pediatrics

## 2017-11-17 ENCOUNTER — Encounter: Payer: Self-pay | Admitting: Pediatrics

## 2017-11-17 VITALS — BP 139/74 | HR 91 | Temp 97.8°F | Ht 60.0 in | Wt 175.4 lb

## 2017-11-17 DIAGNOSIS — N3090 Cystitis, unspecified without hematuria: Secondary | ICD-10-CM

## 2017-11-17 DIAGNOSIS — R399 Unspecified symptoms and signs involving the genitourinary system: Secondary | ICD-10-CM | POA: Diagnosis not present

## 2017-11-17 LAB — URINALYSIS
Bilirubin, UA: NEGATIVE
GLUCOSE, UA: NEGATIVE
Ketones, UA: NEGATIVE
NITRITE UA: POSITIVE — AB
Specific Gravity, UA: 1.015 (ref 1.005–1.030)
Urobilinogen, Ur: 0.2 mg/dL (ref 0.2–1.0)
pH, UA: 6 (ref 5.0–7.5)

## 2017-11-17 MED ORDER — CEFTRIAXONE SODIUM 1 G IJ SOLR
1.0000 g | Freq: Once | INTRAMUSCULAR | Status: AC
  Administered 2017-11-17: 1 g via INTRAMUSCULAR

## 2017-11-17 MED ORDER — CEFPODOXIME PROXETIL 100 MG PO TABS
100.0000 mg | ORAL_TABLET | Freq: Two times a day (BID) | ORAL | 0 refills | Status: DC
Start: 2017-11-17 — End: 2017-12-01

## 2017-11-17 NOTE — Progress Notes (Signed)
  Subjective:   Patient ID: [name], female    DOB: 1945-07-14, 72 y.o.   MRN: [id number] CC: Burning with urination  HPI: [name] is a 72 y.o. female   Dysuria started yesterday.  Subjective chills last night.  She did not take her temperature.  She started having left-sided pain in her abdomen, similar to pain that she has when she has an infection.  She has had multiple episodes of UTIs, follows with urology, considering stent placement.  Appetite is been okay.  Relevant past medical, surgical, family and social history reviewed. Allergies and medications reviewed and updated. Social History   Tobacco Use  Smoking Status Never Smoker  Smokeless Tobacco Never Used   ROS: Per HPI   Objective:    BP 139/74   Pulse 91   Temp 97.8 F (36.6 C) (Oral)   Ht 5' (1.524 m)   Wt 175 lb 6.4 oz (79.6 kg)   BMI 34.26 kg/m   Wt Readings from Last 3 Encounters:  11/17/17 175 lb 6.4 oz (79.6 kg)  10/30/17 179 lb 9.6 oz (81.5 kg)  10/21/17 178 lb (80.7 kg)    Gen: NAD, alert, cooperative with exam, NCAT EYES: EOMI, no conjunctival injection, or no icterus CV: NRRR, normal S1/S2, no murmur, distal pulses 2+ b/l Resp: CTABL, no wheezes, normal WOB Abd: +BS, soft, NTND. no guarding or organomegaly Ext: No edema, warm Neuro: Alert and oriented MSK: normal muscle bulk  Assessment & Plan:  [name] was seen today for burning with urination.  Diagnoses and all orders for this visit:  Cystitis UA positive, ceftriaxone today, start cephalosporin tomorrow.  We will follow-up urine culture.  Patient has upcoming appointment with urology. -     cefpodoxime (VANTIN) 100 MG tablet; Take 1 tablet (100 mg total) by mouth 2 (two) times daily. -     cefTRIAXone (ROCEPHIN) injection 1 g  UTI symptoms -     Cancel: Urinalysis, Complete -     Urinalysis -     Urine Culture; Future -     Urine Culture   Follow up plan: Return in about 3 months (around 02/17/2018). [name], MD [place]

## 2017-11-17 NOTE — Patient Instructions (Signed)
Call to let urology know of repeat infection

## 2017-11-20 LAB — URINE CULTURE

## 2017-11-28 ENCOUNTER — Encounter (HOSPITAL_COMMUNITY): Payer: Self-pay | Admitting: *Deleted

## 2017-11-28 ENCOUNTER — Inpatient Hospital Stay (HOSPITAL_COMMUNITY)
Admission: EM | Admit: 2017-11-28 | Discharge: 2017-12-01 | DRG: 872 | Disposition: A | Discharge status: Home / Self Care | Payer: Medicare HMO | Attending: Internal Medicine | Admitting: Internal Medicine

## 2017-11-28 ENCOUNTER — Other Ambulatory Visit: Payer: Self-pay

## 2017-11-28 DIAGNOSIS — Z792 Long term (current) use of antibiotics: Secondary | ICD-10-CM

## 2017-11-28 DIAGNOSIS — Z8542 Personal history of malignant neoplasm of other parts of uterus: Secondary | ICD-10-CM

## 2017-11-28 DIAGNOSIS — R5383 Other fatigue: Secondary | ICD-10-CM | POA: Diagnosis not present

## 2017-11-28 DIAGNOSIS — Z981 Arthrodesis status: Secondary | ICD-10-CM | POA: Diagnosis not present

## 2017-11-28 DIAGNOSIS — E669 Obesity, unspecified: Secondary | ICD-10-CM | POA: Diagnosis present

## 2017-11-28 DIAGNOSIS — A419 Sepsis, unspecified organism: Secondary | ICD-10-CM

## 2017-11-28 DIAGNOSIS — Z801 Family history of malignant neoplasm of trachea, bronchus and lung: Secondary | ICD-10-CM

## 2017-11-28 DIAGNOSIS — I10 Essential (primary) hypertension: Secondary | ICD-10-CM | POA: Diagnosis present

## 2017-11-28 DIAGNOSIS — Z87440 Personal history of urinary (tract) infections: Secondary | ICD-10-CM

## 2017-11-28 DIAGNOSIS — R6520 Severe sepsis without septic shock: Secondary | ICD-10-CM | POA: Diagnosis present

## 2017-11-28 DIAGNOSIS — Z6834 Body mass index (BMI) 34.0-34.9, adult: Secondary | ICD-10-CM

## 2017-11-28 DIAGNOSIS — Z96643 Presence of artificial hip joint, bilateral: Secondary | ICD-10-CM | POA: Diagnosis present

## 2017-11-28 DIAGNOSIS — Z90710 Acquired absence of both cervix and uterus: Secondary | ICD-10-CM

## 2017-11-28 DIAGNOSIS — Z1629 Resistance to other single specified antibiotic: Secondary | ICD-10-CM | POA: Diagnosis present

## 2017-11-28 DIAGNOSIS — N1330 Unspecified hydronephrosis: Secondary | ICD-10-CM | POA: Diagnosis not present

## 2017-11-28 DIAGNOSIS — Z9841 Cataract extraction status, right eye: Secondary | ICD-10-CM | POA: Diagnosis not present

## 2017-11-28 DIAGNOSIS — F329 Major depressive disorder, single episode, unspecified: Secondary | ICD-10-CM | POA: Diagnosis present

## 2017-11-28 DIAGNOSIS — N12 Tubulo-interstitial nephritis, not specified as acute or chronic: Secondary | ICD-10-CM

## 2017-11-28 DIAGNOSIS — N136 Pyonephrosis: Secondary | ICD-10-CM | POA: Diagnosis present

## 2017-11-28 DIAGNOSIS — Z90722 Acquired absence of ovaries, bilateral: Secondary | ICD-10-CM | POA: Diagnosis not present

## 2017-11-28 DIAGNOSIS — Z888 Allergy status to other drugs, medicaments and biological substances status: Secondary | ICD-10-CM

## 2017-11-28 DIAGNOSIS — Z96651 Presence of right artificial knee joint: Secondary | ICD-10-CM | POA: Diagnosis present

## 2017-11-28 DIAGNOSIS — Z1611 Resistance to penicillins: Secondary | ICD-10-CM | POA: Diagnosis present

## 2017-11-28 DIAGNOSIS — G629 Polyneuropathy, unspecified: Secondary | ICD-10-CM | POA: Diagnosis present

## 2017-11-28 DIAGNOSIS — F419 Anxiety disorder, unspecified: Secondary | ICD-10-CM | POA: Diagnosis present

## 2017-11-28 DIAGNOSIS — Z87448 Personal history of other diseases of urinary system: Secondary | ICD-10-CM

## 2017-11-28 DIAGNOSIS — R109 Unspecified abdominal pain: Secondary | ICD-10-CM | POA: Diagnosis not present

## 2017-11-28 DIAGNOSIS — Z800 Family history of malignant neoplasm of digestive organs: Secondary | ICD-10-CM | POA: Diagnosis not present

## 2017-11-28 DIAGNOSIS — N390 Urinary tract infection, site not specified: Secondary | ICD-10-CM

## 2017-11-28 DIAGNOSIS — Z833 Family history of diabetes mellitus: Secondary | ICD-10-CM | POA: Diagnosis not present

## 2017-11-28 DIAGNOSIS — Z9842 Cataract extraction status, left eye: Secondary | ICD-10-CM | POA: Diagnosis not present

## 2017-11-28 DIAGNOSIS — Z9109 Other allergy status, other than to drugs and biological substances: Secondary | ICD-10-CM

## 2017-11-28 DIAGNOSIS — E875 Hyperkalemia: Secondary | ICD-10-CM | POA: Diagnosis present

## 2017-11-28 DIAGNOSIS — R69 Illness, unspecified: Secondary | ICD-10-CM | POA: Diagnosis not present

## 2017-11-28 DIAGNOSIS — Z882 Allergy status to sulfonamides status: Secondary | ICD-10-CM

## 2017-11-28 DIAGNOSIS — Z86010 Personal history of colonic polyps: Secondary | ICD-10-CM | POA: Diagnosis not present

## 2017-11-28 DIAGNOSIS — F32A Depression, unspecified: Secondary | ICD-10-CM | POA: Diagnosis present

## 2017-11-28 DIAGNOSIS — A4151 Sepsis due to Escherichia coli [E. coli]: Secondary | ICD-10-CM | POA: Diagnosis present

## 2017-11-28 DIAGNOSIS — Z91018 Allergy to other foods: Secondary | ICD-10-CM

## 2017-11-28 DIAGNOSIS — Z79899 Other long term (current) drug therapy: Secondary | ICD-10-CM

## 2017-11-28 LAB — URINALYSIS, ROUTINE W REFLEX MICROSCOPIC
Bilirubin Urine: NEGATIVE
GLUCOSE, UA: NEGATIVE mg/dL
Ketones, ur: NEGATIVE mg/dL
Nitrite: POSITIVE — AB
PROTEIN: 100 mg/dL — AB
RBC / HPF: >50 RBC/hpf — ABNORMAL HIGH (ref 0–5)
Specific Gravity, Urine: 1.016 (ref 1.005–1.030)
WBC, UA: >50 WBC/hpf — ABNORMAL HIGH (ref 0–5)
pH: 6 (ref 5.0–8.0)

## 2017-11-28 LAB — COMPREHENSIVE METABOLIC PANEL
ALT: 13 U/L (ref 0–44)
AST: 39 U/L (ref 15–41)
Albumin: 3.7 g/dL (ref 3.5–5.0)
Alkaline Phosphatase: 54 U/L (ref 38–126)
Anion gap: 11 (ref 5–15)
BUN: 9 mg/dL (ref 8–23)
CHLORIDE: 102 mmol/L (ref 98–111)
CO2: 24 mmol/L (ref 22–32)
CREATININE: 1.04 mg/dL — AB (ref 0.44–1.00)
Calcium: 9.1 mg/dL (ref 8.9–10.3)
GFR calc Af Amer: >60 mL/min (ref >60)
GFR calc non Af Amer: 52 mL/min — ABNORMAL LOW (ref >60)
Glucose, Bld: 146 mg/dL — ABNORMAL HIGH (ref 70–99)
Potassium: 5.3 mmol/L — ABNORMAL HIGH (ref 3.5–5.1)
SODIUM: 137 mmol/L (ref 135–145)
Total Bilirubin: 1.4 mg/dL — ABNORMAL HIGH (ref 0.3–1.2)
Total Protein: 6.5 g/dL (ref 6.5–8.1)

## 2017-11-28 LAB — CBC
HEMATOCRIT: 43.1 % (ref 36.0–46.0)
Hemoglobin: 14.7 g/dL (ref 12.0–15.0)
MCH: 32 pg (ref 26.0–34.0)
MCHC: 34.1 g/dL (ref 30.0–36.0)
MCV: 93.9 fL (ref 78.0–100.0)
PLATELETS: 349 10*3/uL (ref 150–400)
RBC: 4.59 MIL/uL (ref 3.87–5.11)
RDW: 12.8 % (ref 11.5–15.5)
WBC: 22.4 10*3/uL — ABNORMAL HIGH (ref 4.0–10.5)

## 2017-11-28 LAB — I-STAT CG4 LACTIC ACID, ED: Lactic Acid, Venous: 3.49 mmol/L (ref 0.5–1.9)

## 2017-11-28 LAB — LIPASE, BLOOD: LIPASE: 34 U/L (ref 11–51)

## 2017-11-28 MED ORDER — SODIUM CHLORIDE 0.9 % IV BOLUS
1000.0000 mL | Freq: Once | INTRAVENOUS | Status: AC
  Administered 2017-11-28: 1000 mL via INTRAVENOUS

## 2017-11-28 MED ORDER — SODIUM CHLORIDE 0.9 % IV SOLN
2.0000 g | Freq: Once | INTRAVENOUS | Status: AC
  Administered 2017-11-28: 2 g via INTRAVENOUS
  Filled 2017-11-28: qty 20

## 2017-11-28 MED ORDER — ACETAMINOPHEN 500 MG PO TABS
1000.0000 mg | ORAL_TABLET | Freq: Once | ORAL | Status: AC
  Administered 2017-11-28: 1000 mg via ORAL
  Filled 2017-11-28: qty 2

## 2017-11-28 MED ORDER — SODIUM CHLORIDE 0.9 % IV BOLUS
500.0000 mL | Freq: Once | INTRAVENOUS | Status: AC
  Administered 2017-11-28: 500 mL via INTRAVENOUS

## 2017-11-28 NOTE — ED Provider Notes (Addendum)
[facility] DEPT Provider Note   CSN: [id number] Arrival date & time: 11/28/17  1948     History   Chief Complaint Chief Complaint  Patient presents with  . Flank Pain    HPI [name] is a 72 y.o. female.  HPI   72 year old female with history of recurrent UTIs and pyelonephritis secondary to chronic left hydronephrosis here with left flank pain.  The patient was just diagnosed with UTI last week at her PCP.  She had urinary frequency and burning at the time.  Over the last several days, she is noticed increasing left flank pain along with nausea and vomiting throughout the day today.  She is been unable to eat or drink today.  The pain is aching, gnawing, and worse with certain movements.  She is had associated persistent urinary frequency.  She has had no diarrhea or change in bowel habits.  No fevers but has had some chills.  No alleviating factors for pain.  Past Medical History:  Diagnosis Date  . Arthritis   . Colon polyp, hyperplastic 04/27/2012   Colonoscopy 2010.  No adenomatous polyps   . Endometrioid adenocarcinoma   . History of endometrial cancer s/p TAH BSO June2013 06/15/2011   Grade 1 endometrioid adenocarcinoma with focal superficial myometrial invasion of 0.1 cm with a myometrium of 1.8 cm or approximately 7%. She had a 5-cm tumor. No lymphovascular space Involvement.    . Hyperlipidemia   . Hypertension   . Mass of in fold of jejunum s/p SB resection 07/19/2012 07/20/2012  . Obesity (BMI 30-39.9) 04/27/2012  . Pyelonephritis 02/2016  . 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 SBO (small bowel obstruction) - recurrent 04/27/2012  . Ureteral adhesion, left 2010   s/p ureterolysis & stenting Dr. Larwance Sachs now  . UTI (urinary tract infection)     Patient Active Problem List   Diagnosis Date Noted  . Sepsis due to Escherichia coli (E. coli) ([place])   . Hydronephrosis    . Pyelonephritis 08/01/2017  . Class 2 severe obesity with serious comorbidity and body mass index (BMI) of 35.0 to 35.9 in adult ([place]) 06/21/2017  . History of hydronephrosis   . Low back pain   . Abdominal pain 04/26/2016  . Left flank pain   . Intractable abdominal pain 04/25/2016  . Recurrent UTI 02/20/2016  . Peripheral neuropathy 02/08/2016  . Depression 02/08/2016  . OA (osteoarthritis) of hip 07/04/2014  . 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 Pseudoarthrosis L3- L5 12/17/2012  . Nocturnal leg cramps 11/13/2012  . HLD (hyperlipidemia) 08/10/2012  . Recurrent ventral incisional hernia s/p lap repair 04/23/2013 04/27/2012  . Obesity (BMI 30-39.9) 04/27/2012  . Chronic pain 04/27/2012  . Hypertension   . History of endometrial cancer s/p TAH BSO June2013 06/15/2011    Past Surgical History:  Procedure Laterality Date  . ABDOMINAL HYSTERECTOMY  11/02/10   TAH, BSO, lysis of adhesions for endometrial cancer  . [place], 2009   anterior L2-3, L3-4 arthrodesis  . BACK SURGERY  12/17/2012   Spinal fusion and repair   . BLADDER SUSPENSION  2003  . CATARACT EXTRACTION, BILATERAL  2003  . EYE SURGERY    . FOOT SURGERY  06/2010   left foot surgery  . HERNIA REPAIR  07/19/12   lap [place] repair  . INCISIONAL HERNIA REPAIR  11/02/10  . INSERTION OF MESH  N/A 04/23/2013   Procedure: INSERTION OF MESH;  Surgeon: Adin Hector, MD;  Location: [facility];  Service: General;  Laterality: N/A;  . JOINT REPLACEMENT  09/2009   Right total knee  . KIDNEY SURGERY  2010   Left kidney  . LAPAROSCOPIC LYSIS OF ADHESIONS N/A 04/23/2013   Procedure: LAPAROSCOPIC LYSIS OF ADHESIONS;  Surgeon: Adin Hector, MD;  Location: [facility];  Service: General;  Laterality: N/A;  . TOTAL HIP ARTHROPLASTY  01/2010   Right total hip  . TOTAL HIP ARTHROPLASTY Left 07/04/2014   Procedure: LEFT TOTAL HIP ARTHROPLASTY ANTERIOR APPROACH;  Surgeon: Gearlean Alf, MD;  Location: [facility];  Service: Orthopedics;  Laterality: Left;  . TOTAL KNEE ARTHROPLASTY     right  . URETEROLYSIS  2010   lap/open left ureterolysis & omental flap, ureter stent  . VENTRAL HERNIA REPAIR N/A 07/19/2012   Procedure: LAPAROSCOPIC LYSIS OF ADHESIONS, SMALL BOWEL RESECTION, SEROSAL REPAIR, PRIMARY VENTRAL HERNIA REPAIR;  Surgeon: Adin Hector, MD;  Location: [facility];  Service: General;  Laterality: N/A;  . VENTRAL HERNIA REPAIR N/A 04/23/2013   Procedure: LAPAROSCOPIC exploration and repair of hernia in abdominal VENTRAL wall  HERNIA;  Surgeon: Adin Hector, MD;  Location: [facility];  Service: General;  Laterality: N/A;     OB History   None      Home Medications    Prior to Admission medications   Medication Sig Start Date End Date Taking? Authorizing Provider  amitriptyline (ELAVIL) 25 MG tablet Take 1 tablet (25 mg total) by mouth at bedtime. 09/21/17  Yes [name], MD  gabapentin (NEURONTIN) 300 MG capsule Take 2 capsules (600 mg total) by mouth at bedtime. Patient taking differently: Take 300 mg by mouth at bedtime.  06/21/17  Yes [name], MD  lisinopril-hydrochlorothiazide (PRINZIDE,ZESTORETIC) 10-12.5 MG tablet Take 1 tablet by mouth daily. 09/24/17  Yes [name], MD  methocarbamol (ROBAXIN) 500 MG tablet Take 500 mg by mouth 4 (four) times daily.   Yes [provider]  Sennosides (EX-LAX PO) Take 2-4 tablets by mouth daily as needed (CONSTIPATION).   Yes [provider]  cefpodoxime (VANTIN) 100 MG tablet Take 1 tablet (100 mg total) by mouth 2 (two) times daily. Patient not taking: Reported on 11/28/2017 11/17/17   [name], MD    Family History Family History  Problem Relation Age of Onset  . Diabetes Mother   . Cancer Mother        lung  . Diabetes Father   . Cancer Father        liver  . Colon cancer Sister   . Diabetes Sister   . Cancer Sister        colon  . Breast cancer Neg Hx   .  Allergic rhinitis Neg Hx   . Angioedema Neg Hx   . Asthma Neg Hx   . Atopy Neg Hx   . Eczema Neg Hx   . Immunodeficiency Neg Hx   . Urticaria Neg Hx     Social History Social History   Tobacco Use  . Smoking status: Never Smoker  . Smokeless tobacco: Never Used  Substance Use Topics  . Alcohol use: No  . Drug use: No     Allergies   Cashew nut oil; Dog epithelium; Ondansetron hcl; Sulfa antibiotics; Sulfamethoxazole; and Zofran   Review of Systems Review of Systems  Constitutional: Positive for chills and fatigue. Negative for fever.  HENT: Negative for congestion and rhinorrhea.   Eyes: Negative for visual disturbance.  Respiratory: Negative for cough, shortness of breath and wheezing.   Cardiovascular: Negative for chest pain and leg swelling.  Gastrointestinal: Positive for abdominal pain and nausea. Negative for diarrhea and vomiting.  Genitourinary: Positive for dysuria, flank pain and urgency.  Musculoskeletal: Negative for neck pain and neck stiffness.  Skin: Negative for rash and wound.  Allergic/Immunologic: Negative for immunocompromised state.  Neurological: Negative for syncope, weakness and headaches.  All other systems reviewed and are negative.    Physical Exam Updated Vital Signs BP 104/64 (BP Location: Right Arm)   Pulse 91   Temp (!) 101 F (38.3 C) (Rectal)   Resp (!) 23   Ht 5' (1.524 m)   Wt 80.7 kg (178 lb)   SpO2 94%   BMI 34.76 kg/m   Physical Exam  Constitutional: She is oriented to person, place, and time. She appears well-developed and well-nourished. No distress.  HENT:  Head: Normocephalic and atraumatic.  Dry mucous membranes  Eyes: Conjunctivae are normal.  Neck: Neck supple.  Cardiovascular: Normal rate, regular rhythm and normal heart sounds. Exam reveals no friction rub.  No murmur heard. Pulmonary/Chest: Effort normal and breath sounds normal. No respiratory distress. She has no wheezes. She has no rales.  Abdominal:  She exhibits no distension.  Moderate left-sided CVA tenderness.  Abdomen is otherwise soft.  No rebound or guarding.  Musculoskeletal: She exhibits no edema.  Neurological: She is alert and oriented to person, place, and time. She exhibits normal muscle tone.  Skin: Skin is warm. Capillary refill takes less than 2 seconds.  Psychiatric: She has a normal mood and affect.  Nursing note and vitals reviewed.    ED Treatments / Results  Labs (all labs ordered are listed, but only abnormal results are displayed) Labs Reviewed  COMPREHENSIVE METABOLIC PANEL - Abnormal; Notable for the following components:      Result Value   Potassium 5.3 (*)    Glucose, Bld 146 (*)    Creatinine, Ser 1.04 (*)    Total Bilirubin 1.4 (*)    GFR calc non Af Amer 52 (*)    All other components within normal limits  CBC - Abnormal; Notable for the following components:   WBC 22.4 (*)    All other components within normal limits  URINALYSIS, ROUTINE W REFLEX MICROSCOPIC - Abnormal; Notable for the following components:   Color, Urine AMBER (*)    APPearance CLOUDY (*)    Hgb urine dipstick SMALL (*)    Protein, ur 100 (*)    Nitrite POSITIVE (*)    Leukocytes, UA MODERATE (*)    RBC / HPF >50 (*)    WBC, UA >50 (*)    Bacteria, UA RARE (*)    All other components within normal limits  I-STAT CG4 LACTIC ACID, ED - Abnormal; Notable for the following components:   Lactic Acid, Venous 3.49 (*)    All other components within normal limits  CULTURE, BLOOD (ROUTINE X 2)  CULTURE, BLOOD (ROUTINE X 2)  URINE CULTURE  LIPASE, BLOOD  I-STAT CG4 LACTIC ACID, ED  I-STAT CG4 LACTIC ACID, ED    EKG None  Radiology No results found.  Procedures .Critical Care Performed by: Duffy Bruce, MD Authorized by: Duffy Bruce, MD   Critical care provider statement:    Critical care time (minutes):  55   Critical care time was exclusive of:  Separately billable procedures and  treating other patients  and teaching time   Critical care was necessary to treat or prevent imminent or life-threatening deterioration of the following conditions:  Cardiac failure, respiratory failure, circulatory failure and sepsis   Critical care was time spent personally by me on the following activities:  Development of treatment plan with patient or surrogate, discussions with consultants, evaluation of patient's response to treatment, examination of patient, obtaining history from patient or surrogate, ordering and performing treatments and interventions, ordering and review of laboratory studies, ordering and review of radiographic studies, pulse oximetry, re-evaluation of patient's condition and review of old charts   I assumed direction of critical care for this patient from another provider in my specialty: no     (including critical care time)  Medications Ordered in ED Medications  sodium chloride 0.9 % bolus 1,000 mL (1,000 mLs Intravenous New Bag/Given 11/28/17 2210)  cefTRIAXone (ROCEPHIN) 2 g in sodium chloride 0.9 % 100 mL IVPB (2 g Intravenous New Bag/Given 11/28/17 2233)  sodium chloride 0.9 % bolus 1,000 mL (has no administration in time range)  sodium chloride 0.9 % bolus 500 mL (500 mLs Intravenous New Bag/Given 11/28/17 2233)  acetaminophen (TYLENOL) tablet 1,000 mg (1,000 mg Oral Given 11/28/17 2114)     Initial Impression / Assessment and Plan / ED Course  I have reviewed the triage vital signs and the nursing notes.  Pertinent labs & imaging results that were available during my care of the patient were reviewed by me and considered in my medical decision making (see chart for details).     72 year old female here with left-sided flank pain.  Patient febrile on arrival with significant leukocytosis and lactic acidosis.  Concern for sepsis secondary to recurrent pyelonephritis.  Patient had positive blood cultures and urine culture for E. coli during recent admission.  Recent urine culture  shows this is sensitive to Rocephin. Pain began gradually, doubt stone. Will start IV Rocephin, send blood and urine cultures, and admit.  Final Clinical Impressions(s) / ED Diagnoses   Final diagnoses:  Pyelonephritis  Sepsis due to urinary tract infection [facility])    ED Discharge Orders    None       Duffy Bruce, MD 11/28/17 2248    Duffy Bruce, MD 11/28/17 2249

## 2017-11-28 NOTE — ED Triage Notes (Signed)
Left sided flank pain. Pt is being treated for UTI, still taking meds for the same. Says that the pain is still bad and she started vomiting around 5pm today. Denies fevers.

## 2017-11-28 NOTE — ED Notes (Signed)
Attempted an IV x 2 but was unsuccessful. Will order for an IV team consult.

## 2017-11-28 NOTE — H&P (Signed)
History and Physical    [name] RWE:[id number] DOB: 02-13-1946 DOA: 11/28/2017  PCP: [name], MD   Patient coming from: Home  Chief Complaint: Left-sided flank/abdominal pain, burning with urination  HPI: [name] is a 72 y.o. female with medical history significant for recurrent urinary tract infections, chronic left hydronephrosis, chronic pain, HTN, endometrial cancer- 2013 s/p TAH BSO.  Patient presented to the ED with complaints of left-sided flank pain that started today.  Also complains of burning with urination and urinary frequency.  She started vomiting today, 2 episodes, non-bloody.  Patient saw her PCP for similar symptoms 11/17/17, was given 1 g ceftriaxone in the ED and sent home with Cefpodoxime for 7 days for cystitis/UTI symptoms.  Patient reports compliance with medication, but recurrence of symptoms when she completed course.  She is on chronic antibiotic ?name for UTI per her urologist.  She had urology appointment with Dr. Alinda Money for tomorrow.  Stent placement for hydronephrosis is being considered.  Patient denies shortness of breath or cough, no loose stools, no chest pain.  Recent hospital admission 6/15-6/17-for E. coli pyelonephritis and bacteremia and March/2019 - E. Coli pyelonephritis.  ED Course: Febrile- 102.1, intermittent tachypnea- 23, blood pressure systolic 619- 509. UA-moderate leukocyte positive nitrites rare bacteria small hemoglobin.  WBC elevated 22.  Lactic acid elevated 3.49. EKG sinus rhythm.   Review of Systems: As per HPI otherwise all other systems reviewed and negative.  Past Medical History:  Diagnosis Date  . Arthritis   . Colon polyp, hyperplastic 04/27/2012   Colonoscopy 2010.  No adenomatous polyps   . Endometrioid adenocarcinoma   . History of endometrial cancer s/p TAH BSO June2013 06/15/2011   Grade 1 endometrioid adenocarcinoma with focal superficial myometrial invasion of 0.1 cm with a myometrium of 1.8 cm  or approximately 7%. She had a 5-cm tumor. No lymphovascular space Involvement.    . Hyperlipidemia   . Hypertension   . Mass of in fold of jejunum s/p SB resection 07/19/2012 07/20/2012  . Obesity (BMI 30-39.9) 04/27/2012  . Pyelonephritis 02/2016  . 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 SBO (small bowel obstruction) - recurrent 04/27/2012  . Ureteral adhesion, left 2010   s/p ureterolysis & stenting Dr. Larwance Sachs now  . UTI (urinary tract infection)     Past Surgical History:  Procedure Laterality Date  . ABDOMINAL HYSTERECTOMY  11/02/10   TAH, BSO, lysis of adhesions for endometrial cancer  . [place], 2009   anterior L2-3, L3-4 arthrodesis  . BACK SURGERY  12/17/2012   Spinal fusion and repair   . BLADDER SUSPENSION  2003  . CATARACT EXTRACTION, BILATERAL  2003  . EYE SURGERY    . FOOT SURGERY  06/2010   left foot surgery  . HERNIA REPAIR  07/19/12   lap [place] repair  . INCISIONAL HERNIA REPAIR  11/02/10  . INSERTION OF MESH N/A 04/23/2013   Procedure: INSERTION OF MESH;  Surgeon: Adin Hector, MD;  Location: [facility];  Service: General;  Laterality: N/A;  . JOINT REPLACEMENT  09/2009   Right total knee  . KIDNEY SURGERY  2010   Left kidney  . LAPAROSCOPIC LYSIS OF ADHESIONS N/A 04/23/2013   Procedure: LAPAROSCOPIC LYSIS OF ADHESIONS;  Surgeon: Adin Hector, MD;  Location: [facility];  Service: General;  Laterality: N/A;  . TOTAL HIP ARTHROPLASTY  01/2010   Right total hip  . TOTAL HIP  ARTHROPLASTY Left 07/04/2014   Procedure: LEFT TOTAL HIP ARTHROPLASTY ANTERIOR APPROACH;  Surgeon: Gearlean Alf, MD;  Location: [place];  Service: Orthopedics;  Laterality: Left;  . TOTAL KNEE ARTHROPLASTY     right  . URETEROLYSIS  2010   lap/open left ureterolysis & omental flap, ureter stent  . VENTRAL HERNIA REPAIR N/A 07/19/2012   Procedure: LAPAROSCOPIC LYSIS OF ADHESIONS, SMALL BOWEL RESECTION, SEROSAL REPAIR,  PRIMARY VENTRAL HERNIA REPAIR;  Surgeon: Adin Hector, MD;  Location: [facility];  Service: General;  Laterality: N/A;  . VENTRAL HERNIA REPAIR N/A 04/23/2013   Procedure: LAPAROSCOPIC exploration and repair of hernia in abdominal VENTRAL wall  HERNIA;  Surgeon: Adin Hector, MD;  Location: [facility];  Service: General;  Laterality: N/A;     reports that she has never smoked. She has never used smokeless tobacco. She reports that she does not drink alcohol or use drugs.  Allergies  Allergen Reactions  . Cashew Nut Oil Hives  . Dog Epithelium Hives and Itching  . Ondansetron Hcl Nausea And Vomiting    "made me throw up"  . Sulfa Antibiotics     "blistering lips and mouth sores"  . Sulfamethoxazole     Mouth sores  . Zofran Hives and Nausea Only    Family History  Problem Relation Age of Onset  . Diabetes Mother   . Cancer Mother        lung  . Diabetes Father   . Cancer Father        liver  . Colon cancer Sister   . Diabetes Sister   . Cancer Sister        colon  . Breast cancer Neg Hx   . Allergic rhinitis Neg Hx   . Angioedema Neg Hx   . Asthma Neg Hx   . Atopy Neg Hx   . Eczema Neg Hx   . Immunodeficiency Neg Hx   . Urticaria Neg Hx     Prior to Admission medications   Medication Sig Start Date End Date Taking? Authorizing Provider  amitriptyline (ELAVIL) 25 MG tablet Take 1 tablet (25 mg total) by mouth at bedtime. 09/21/17  Yes [name], MD  gabapentin (NEURONTIN) 300 MG capsule Take 2 capsules (600 mg total) by mouth at bedtime. Patient taking differently: Take 300 mg by mouth at bedtime.  06/21/17  Yes [name], MD  lisinopril-hydrochlorothiazide (PRINZIDE,ZESTORETIC) 10-12.5 MG tablet Take 1 tablet by mouth daily. 09/24/17  Yes [name], MD  methocarbamol (ROBAXIN) 500 MG tablet Take 500 mg by mouth 4 (four) times daily.   Yes [provider]  Sennosides (EX-LAX PO) Take 2-4 tablets by mouth daily as needed (CONSTIPATION).   Yes  [provider]  cefpodoxime (VANTIN) 100 MG tablet Take 1 tablet (100 mg total) by mouth 2 (two) times daily. Patient not taking: Reported on 11/28/2017 11/17/17   [name], MD    Physical Exam: Vitals:   11/28/17 2113 11/28/17 2227 11/28/17 2237 11/28/17 2242  BP:  104/64    Pulse:  91    Resp:  (!) 23    Temp: (!) 102.1 F (38.9 C)   (!) 101 F (38.3 C)  TempSrc: Rectal   Rectal  SpO2:  94%    Weight:   80.7 kg (178 lb)   Height:   5' (1.524 m)     Constitutional: NAD, calm, comfortable Vitals:   11/28/17 2113 11/28/17 2227 11/28/17 2237 11/28/17 2242  BP:  104/64    Pulse:  91    Resp:  (!) 23    Temp: (!) 102.1 F (38.9 C)   (!) 101 F (38.3 C)  TempSrc: Rectal   Rectal  SpO2:  94%    Weight:   80.7 kg (178 lb)   Height:   5' (1.524 m)    Eyes: PERRL, lids and conjunctivae normal ENMT: Mucous membranes are moist. Posterior pharynx clear of any exudate or lesions.Normal dentition.  Neck: normal, supple, no masses, no thyromegaly Respiratory: clear to auscultation bilaterally, no wheezing, no crackles. Normal respiratory effort. No accessory muscle use.  Cardiovascular: Regular rate and rhythm, no murmurs / rubs / gallops. No extremity edema. 2+ pedal pulses. No carotid bruits.  Abdomen: no CVA tenderness elicited, but tenderness present left side of abdomen, no masses palpated. No hepatosplenomegaly. Bowel sounds positive.  Musculoskeletal: no clubbing / cyanosis. No joint deformity upper and lower extremities. Good ROM, no contractures. Normal muscle tone.  Skin: no rashes, lesions, ulcers. No induration Neurologic: CN 2-12 grossly intact.  Strength 5/5 in all 4.  Psychiatric: Normal judgment and insight. Alert and oriented x 3. Normal mood.   Labs on Admission: I have personally reviewed following labs and imaging studies  CBC: Recent Labs  Lab 11/28/17 2208  WBC 22.4*  HGB 14.7  HCT 43.1  MCV 93.9  PLT 539   Basic Metabolic Panel: Recent  Labs  Lab 11/28/17 2208  NA 137  K 5.3*  CL 102  CO2 24  GLUCOSE 146*  BUN 9  CREATININE 1.04*  CALCIUM 9.1   Liver Function Tests: Recent Labs  Lab 11/28/17 2208  AST 39  ALT 13  ALKPHOS 54  BILITOT 1.4*  PROT 6.5  ALBUMIN 3.7   Recent Labs  Lab 11/28/17 2208  LIPASE 34   Urine analysis:    Component Value Date/Time   COLORURINE AMBER (A) 11/28/2017 2029   APPEARANCEUR CLOUDY (A) 11/28/2017 2029   APPEARANCEUR Cloudy (A) 11/17/2017 1613   LABSPEC 1.016 11/28/2017 2029   PHURINE 6.0 11/28/2017 2029   GLUCOSEU NEGATIVE 11/28/2017 2029   HGBUR SMALL (A) 11/28/2017 2029   BILIRUBINUR NEGATIVE 11/28/2017 2029   BILIRUBINUR Negative 11/17/2017 1613   KETONESUR NEGATIVE 11/28/2017 2029   PROTEINUR 100 (A) 11/28/2017 2029   UROBILINOGEN negative 09/30/2014 1049   UROBILINOGEN 0.2 07/07/2014 0537   NITRITE POSITIVE (A) 11/28/2017 2029   LEUKOCYTESUR MODERATE (A) 11/28/2017 2029   LEUKOCYTESUR 3+ (A) 11/17/2017 1613    Radiological Exams on Admission: No results found.  EKG: Independently reviewed.  Sinus rhythm. Low Voltage.   Assessment/Plan Active Problems:   Hypertension   Depression   History of hydronephrosis   Pyelonephritis  Left Pyelonephritis-left-sided pain, dysuria urinary frequency fever- 102, leukocytosis - 22.  Patient meets SIRS criteria.  With lactic acidosis- 3.49. Recurrent history of UTIs.  History of chronic left hydronephrosis. 2.5L bolus sepsis fluid given in ED. patient reports being on chronic preventive therapy (?name-urine cultures resistant to bactrim). -Continue IV ceftriaxone 2 g daily (following prior sensitivity results) -CBC a.m. -Would benefit from urology consultation ( Dr. Alinda Money with [facility]) inpatient (as stent placement being considered as outpatient) -Will defer further imaging to urology - N/s 100cc/hr X 12 hrs -Trend lactic acid -Follow blood and urine cultures drawn in ED  History of left  hydronephrosis-renal ultrasound 09/21/2017 -persistent left-sided hydronephrosis , which has been present on comparison CT scan since 2017. May be provoking recurrent UTIs, 3rd mission  for pyelonephritis in a 5 months. -Urology consultation a.m. -Further imaging per urology  Mild hyperkalemia- 5.3. ?  Spurious - BMP a.m  Hypertension-blood pressure soft.  -Hold home antihypertensives lisinopril-HCTZ -Hydrate  Depression-continue home amitriptyline.  DVT prophylaxis: Lovenox Code Status: full Family Communication: None at bedside Disposition Plan: Per rounding team  Consults called: UROLOGYconsultation in a.m.  Admission status: inpt, tele   Bethena Roys MD [facility] Pager [phone] From 6PM-2AM.  Otherwise please contact night-coverage [email or web address] Password [facility]  11/28/2017, 11:52 PM

## 2017-11-29 ENCOUNTER — Inpatient Hospital Stay (HOSPITAL_COMMUNITY): Payer: Medicare HMO

## 2017-11-29 DIAGNOSIS — N12 Tubulo-interstitial nephritis, not specified as acute or chronic: Secondary | ICD-10-CM

## 2017-11-29 LAB — BASIC METABOLIC PANEL
ANION GAP: 11 (ref 5–15)
BUN: 9 mg/dL (ref 8–23)
CHLORIDE: 107 mmol/L (ref 98–111)
CO2: 19 mmol/L — ABNORMAL LOW (ref 22–32)
Calcium: 7.8 mg/dL — ABNORMAL LOW (ref 8.9–10.3)
Creatinine, Ser: 1.03 mg/dL — ABNORMAL HIGH (ref 0.44–1.00)
GFR, EST NON AFRICAN AMERICAN: 53 mL/min — AB (ref >60)
Glucose, Bld: 195 mg/dL — ABNORMAL HIGH (ref 70–99)
POTASSIUM: 4.3 mmol/L (ref 3.5–5.1)
SODIUM: 137 mmol/L (ref 135–145)

## 2017-11-29 LAB — CBC
HEMATOCRIT: 38.5 % (ref 36.0–46.0)
HEMOGLOBIN: 12.8 g/dL (ref 12.0–15.0)
MCH: 32.1 pg (ref 26.0–34.0)
MCHC: 33.2 g/dL (ref 30.0–36.0)
MCV: 96.5 fL (ref 78.0–100.0)
Platelets: 274 10*3/uL (ref 150–400)
RBC: 3.99 MIL/uL (ref 3.87–5.11)
RDW: 12.9 % (ref 11.5–15.5)
WBC: 20 10*3/uL — AB (ref 4.0–10.5)

## 2017-11-29 LAB — LACTIC ACID, PLASMA
Lactic Acid, Venous: 3.1 mmol/L (ref 0.5–1.9)
Lactic Acid, Venous: 2.2 mmol/L (ref 0.5–1.9)
Lactic Acid, Venous: 1.6 mmol/L (ref 0.5–1.9)

## 2017-11-29 MED ORDER — ONDANSETRON HCL 4 MG/2ML IJ SOLN: 4.0000 mg | Freq: Four times a day (QID) | INTRAMUSCULAR | Status: DC | PRN

## 2017-11-29 MED ORDER — AMITRIPTYLINE HCL 25 MG PO TABS
25.0000 mg | ORAL_TABLET | Freq: Every day | ORAL | Status: DC
  Administered 2017-11-29 – 2017-11-30 (×3): 25 mg via ORAL
  Filled 2017-11-29 (×3): qty 1

## 2017-11-29 MED ORDER — IOPAMIDOL (ISOVUE-300) INJECTION 61%
15.0000 mL | Freq: Once | INTRAVENOUS | Status: AC | PRN
  Administered 2017-11-29: 15 mL via ORAL
  Filled 2017-11-29: qty 30

## 2017-11-29 MED ORDER — ACETAMINOPHEN 650 MG RE SUPP: 650.0000 mg | Freq: Four times a day (QID) | RECTAL | Status: DC | PRN

## 2017-11-29 MED ORDER — OXYCODONE-ACETAMINOPHEN 5-325 MG PO TABS
1.0000 | ORAL_TABLET | ORAL | Status: DC | PRN
  Administered 2017-11-29 – 2017-12-01 (×7): 1 via ORAL
  Filled 2017-11-29 (×7): qty 1

## 2017-11-29 MED ORDER — ACETAMINOPHEN 325 MG PO TABS
650.0000 mg | ORAL_TABLET | Freq: Four times a day (QID) | ORAL | Status: DC | PRN
  Administered 2017-11-29: 650 mg via ORAL
  Filled 2017-11-29: qty 2

## 2017-11-29 MED ORDER — IOPAMIDOL (ISOVUE-300) INJECTION 61%
INTRAVENOUS | Status: AC
  Filled 2017-11-29: qty 30

## 2017-11-29 MED ORDER — GABAPENTIN 300 MG PO CAPS
300.0000 mg | ORAL_CAPSULE | Freq: Every day | ORAL | Status: DC
  Administered 2017-11-29 – 2017-11-30 (×3): 300 mg via ORAL
  Filled 2017-11-29 (×3): qty 1

## 2017-11-29 MED ORDER — POLYETHYLENE GLYCOL 3350 17 G PO PACK: 17.0000 g | PACK | Freq: Every day | ORAL | Status: DC | PRN

## 2017-11-29 MED ORDER — SODIUM CHLORIDE 0.9 % IV BOLUS
1000.0000 mL | Freq: Once | INTRAVENOUS | Status: AC
  Administered 2017-11-29: 1000 mL via INTRAVENOUS

## 2017-11-29 MED ORDER — ONDANSETRON HCL 4 MG PO TABS: 4.0000 mg | ORAL_TABLET | Freq: Four times a day (QID) | ORAL | Status: DC | PRN

## 2017-11-29 MED ORDER — SODIUM CHLORIDE 0.9 % IV SOLN
2.0000 g | INTRAVENOUS | Status: DC
  Administered 2017-11-29: 2 g via INTRAVENOUS
  Filled 2017-11-29: qty 2

## 2017-11-29 MED ORDER — METHOCARBAMOL 500 MG PO TABS
500.0000 mg | ORAL_TABLET | Freq: Four times a day (QID) | ORAL | Status: DC | PRN
  Administered 2017-11-29 – 2017-12-01 (×5): 500 mg via ORAL
  Filled 2017-11-29 (×5): qty 1

## 2017-11-29 MED ORDER — TRAZODONE HCL 50 MG PO TABS: 50.0000 mg | ORAL_TABLET | Freq: Every evening | ORAL | Status: DC | PRN

## 2017-11-29 MED ORDER — SODIUM CHLORIDE 0.9 % IV BOLUS
500.0000 mL | Freq: Once | INTRAVENOUS | Status: AC
  Administered 2017-11-29: 500 mL via INTRAVENOUS

## 2017-11-29 MED ORDER — SODIUM CHLORIDE 0.9 % IV SOLN
INTRAVENOUS | Status: AC
  Administered 2017-11-29 (×2): via INTRAVENOUS

## 2017-11-29 MED ORDER — ENOXAPARIN SODIUM 40 MG/0.4ML ~~LOC~~ SOLN
40.0000 mg | Freq: Every day | SUBCUTANEOUS | Status: DC
  Administered 2017-11-29 – 2017-12-01 (×3): 40 mg via SUBCUTANEOUS
  Filled 2017-11-29 (×2): qty 0.4

## 2017-11-29 NOTE — Progress Notes (Signed)
PROGRESS NOTE    [name]  HWE:[id number] DOB: Dec 14, 1945 DOA: 11/28/2017 PCP: [name], MD    Brief Narrative:  72 year old with past medical history relevant for hypertension, depression/anxiety, chronic pain, chronic left-sided hydronephrosis, complicated by recurrent episodes of pyelonephritis who comes in with sepsis secondary to likely left-sided pyelonephritis.   Assessment & Plan:   Active Problems:   Hypertension   Depression   History of hydronephrosis   Pyelonephritis   #) Pyelonephritis/left-sided hydronephrosis: Prior imaging is redemonstrated persistent left-sided hydronephrosis which is been noted on both ultrasound and CT scan since 2017.  This is been her third admission for pyelonephritis in the past 6 months.  At this time it is unlikely that she has a complication of pyelonephritis including an intrarenal or perinephric abscess however it would be reasonable to discuss with urology about need for stenting is likely she has scarring of the ureter on that side. -Noncontrast CT ordered -Urology consultation -Status post fluid bolus -Continue IV ceftriaxone 2 g daily started 11/28/2017 -Follow-up blood cultures x2 ordered 11/28/2017 - Follow-up urine culture from 11/27/2017  #) Hypertension: -Hold lisinopril 10 mg daily -Hold HCTZ 12.5 mg daily  #) Pain/psych: -Continue amitriptyline 25 mg nightly -Continue gabapentin 600 mg nightly  Fluids: Tolerating p.o. Electrolytes: Monitor and supplement Nutrition: Heart healthy diet  Prophylaxis: Enoxaparin  Disposition: Pending resolution of fever discussion with urology  Full code   Consultants:   Urology  Procedures:   None  Antimicrobials:  IV ceftriaxone 2 g started 720 06/11/2017   Subjective: Patient reports she is doing much better than admission.  She reports that her fatigue and symptoms of resolved completely.  She was febrile but clinically she is improving.  She denies any  nausea, vomiting, diarrhea.  She does have some flank pain.  Objective: Vitals:   11/28/17 2242 11/29/17 0019 11/29/17 0049 11/29/17 0453  BP:  114/63 122/69 (!) 143/69  Pulse:  84 72 75  Resp:  16 16 18   Temp: (!) 101 F (38.3 C)  98.5 F (36.9 C) 100.1 F (37.8 C)  TempSrc: Rectal  Oral Oral  SpO2:  95% 98% 96%  Weight:      Height:   5' (1.524 m)     Intake/Output Summary (Last 24 hours) at 11/29/2017 1109 Last data filed at 11/29/2017 0900 Gross per 24 hour  Intake 2940 ml  Output 1000 ml  Net 1940 ml   Filed Weights   11/28/17 2237  Weight: 80.7 kg (178 lb)    Examination:  General exam: Appears calm and comfortable  Respiratory system: Clear to auscultation. Respiratory effort normal. Cardiovascular system: Regular rate and rhythm, no murmurs Gastrointestinal system: Soft, nondistended, no rebound or guarding, left-sided flank pain, diminished bowel sounds. Central nervous system: Alert and oriented. No focal neurological deficits. Extremities: Trace lower extremity edema Skin: No rashes on visible skin Psychiatry: Judgement and insight appear normal. Mood & affect appropriate.     Data Reviewed: I have personally reviewed following labs and imaging studies  CBC: Recent Labs  Lab 11/28/17 2208 11/29/17 0219  WBC 22.4* 20.0*  HGB 14.7 12.8  HCT 43.1 38.5  MCV 93.9 96.5  PLT 349 893   Basic Metabolic Panel: Recent Labs  Lab 11/28/17 2208 11/29/17 0219  NA 137 137  K 5.3* 4.3  CL 102 107  CO2 24 19*  GLUCOSE 146* 195*  BUN 9 9  CREATININE 1.04* 1.03*  CALCIUM 9.1 7.8*   GFR: Estimated Creatinine Clearance:  45.8 mL/min (A) (by C-G formula based on SCr of 1.03 mg/dL (H)). Liver Function Tests: Recent Labs  Lab 11/28/17 2208  AST 39  ALT 13  ALKPHOS 54  BILITOT 1.4*  PROT 6.5  ALBUMIN 3.7   Recent Labs  Lab 11/28/17 2208  LIPASE 34   No results for input(s): AMMONIA in the last 168 hours. Coagulation Profile: No results for  input(s): INR, PROTIME in the last 168 hours. Cardiac Enzymes: No results for input(s): CKTOTAL, CKMB, CKMBINDEX, TROPONINI in the last 168 hours. BNP (last 3 results) No results for input(s): PROBNP in the last 8760 hours. HbA1C: No results for input(s): HGBA1C in the last 72 hours. CBG: No results for input(s): GLUCAP in the last 168 hours. Lipid Profile: No results for input(s): CHOL, HDL, LDLCALC, TRIG, CHOLHDL, LDLDIRECT in the last 72 hours. Thyroid Function Tests: No results for input(s): TSH, T4TOTAL, FREET4, T3FREE, THYROIDAB in the last 72 hours. Anemia Panel: No results for input(s): VITAMINB12, FOLATE, FERRITIN, TIBC, IRON, RETICCTPCT in the last 72 hours. Sepsis Labs: Recent Labs  Lab 11/28/17 2201 11/29/17 0219 11/29/17 0537 11/29/17 0804  LATICACIDVEN 3.49* 3.1* 2.2* 1.6    No results found for this or any previous visit (from the past 240 hour(s)).       Radiology Studies: No results found.      Scheduled Meds: . amitriptyline  25 mg Oral QHS  . enoxaparin (LOVENOX) injection  40 mg Subcutaneous Daily  . gabapentin  300 mg Oral QHS  . iopamidol       Continuous Infusions: . sodium chloride 100 mL/hr at 11/29/17 0826  . cefTRIAXone (ROCEPHIN)  IV       LOS: 1 day    Time spent: Jenera, MD [facility]  If 7PM-7AM, please contact night-coverage [email or web address] Password [facility] 11/29/2017, 11:09 AM

## 2017-11-29 NOTE — ED Notes (Signed)
ED TO INPATIENT HANDOFF REPORT  Name/Age/Gender [name] 72 y.o. female  Code Status Code Status History    Date Active Date Inactive Code Status Order ID Comments User Context   10/21/2017 0404 10/23/2017 1502 Full Code [id number]  Etta Quill, DO ED   04/26/2016 0215 04/29/2016 1858 Full Code [id number]  Rise Patience, MD Inpatient   02/08/2016 1657 02/11/2016 1551 Full Code [id number]  Waldemar Dickens, MD ED   07/04/2014 1550 07/07/2014 1614 Full Code [id number]  Gearlean Alf, MD Inpatient   04/23/2013 1619 04/24/2013 1729 Full Code [id number]  Michael Boston, MD Inpatient   12/17/2012 1636 12/19/2012 1432 Full Code [id number]  Kristeen Miss, MD Inpatient   04/27/2012 0643 05/01/2012 1808 Full Code [id number]  Gross, Revonda Standard., MD ED      Home/SNF/Other Home  Chief Complaint UTI  Level of Care/Admitting Diagnosis ED Disposition    ED Disposition Condition [facility]: [facility] [[id number]]  Level of Care: Telemetry [5]  Admit to tele based on following criteria: Other see comments  Comments: Tachycardia  Diagnosis: Pyelonephritis [121975]  Admitting Physician: Kara Pacer  Attending Physician: Bethena Roys [phone]  Estimated length of stay: past midnight tomorrow  Certification:: I certify this patient will need inpatient services for at least 2 midnights  PT Class (Do Not Modify): Inpatient [101]  PT Acc Code (Do Not Modify): Private [1]       Medical History Past Medical History:  Diagnosis Date  . Arthritis   . Colon polyp, hyperplastic 04/27/2012   Colonoscopy 2010.  No adenomatous polyps   . Endometrioid adenocarcinoma   . History of endometrial cancer s/p TAH BSO June2013 06/15/2011   Grade 1 endometrioid adenocarcinoma with focal superficial myometrial invasion of 0.1 cm with a myometrium of 1.8 cm or approximately 7%. She had a 5-cm tumor. No lymphovascular space Involvement.    .  Hyperlipidemia   . Hypertension   . Mass of in fold of jejunum s/p SB resection 07/19/2012 07/20/2012  . Obesity (BMI 30-39.9) 04/27/2012  . Pyelonephritis 02/2016  . 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 SBO (small bowel obstruction) - recurrent 04/27/2012  . Ureteral adhesion, left 2010   s/p ureterolysis & stenting Dr. Larwance Sachs now  . UTI (urinary tract infection)     Allergies Allergies  Allergen Reactions  . Cashew Nut Oil Hives  . Dog Epithelium Hives and Itching  . Ondansetron Hcl Nausea And Vomiting    "made me throw up"  . Sulfa Antibiotics     "blistering lips and mouth sores"  . Sulfamethoxazole     Mouth sores  . Zofran Hives and Nausea Only    IV Location/Drains/Wounds Patient Lines/Drains/Airways Status   Active Line/Drains/Airways    Name:   Placement date:   Placement time:   Site:   Days:   Peripheral IV 11/28/17 Left;Lateral Wrist   11/28/17    2204    Wrist   1          Labs/Imaging Results for orders placed or performed during the hospital encounter of 11/28/17 (from the past 48 hour(s))  Urinalysis, Routine w reflex microscopic     Status: Abnormal   Collection Time: 11/28/17  8:29 PM  Result Value Ref Range   Color, Urine AMBER (A) YELLOW    Comment: BIOCHEMICALS MAY BE AFFECTED BY COLOR   APPearance CLOUDY (A) CLEAR  Specific Gravity, Urine 1.016 1.005 - 1.030   pH 6.0 5.0 - 8.0   Glucose, UA NEGATIVE NEGATIVE mg/dL   Hgb urine dipstick SMALL (A) NEGATIVE   Bilirubin Urine NEGATIVE NEGATIVE   Ketones, ur NEGATIVE NEGATIVE mg/dL   Protein, ur 100 (A) NEGATIVE mg/dL   Nitrite POSITIVE (A) NEGATIVE   Leukocytes, UA MODERATE (A) NEGATIVE   RBC / HPF >50 (H) 0 - 5 RBC/hpf   WBC, UA >50 (H) 0 - 5 WBC/hpf   Bacteria, UA RARE (A) NONE SEEN   Mucus PRESENT     Comment: Performed at [facility], [place] [address] [place], [place] [address]  I-Stat CG4 Lactic Acid, ED      Status: Abnormal   Collection Time: 11/28/17 10:01 PM  Result Value Ref Range   Lactic Acid, Venous 3.49 (HH) 0.5 - 1.9 mmol/L   Comment NOTIFIED PHYSICIAN   Lipase, blood     Status: None   Collection Time: 11/28/17 10:08 PM  Result Value Ref Range   Lipase 34 11 - 51 U/L    Comment: Performed at [facility], [place] [address] [place], [place] [address]  Comprehensive metabolic panel     Status: Abnormal   Collection Time: 11/28/17 10:08 PM  Result Value Ref Range   Sodium 137 135 - 145 mmol/L   Potassium 5.3 (H) 3.5 - 5.1 mmol/L   Chloride 102 98 - 111 mmol/L   CO2 24 22 - 32 mmol/L   Glucose, Bld 146 (H) 70 - 99 mg/dL   BUN 9 8 - 23 mg/dL   Creatinine, Ser 1.04 (H) 0.44 - 1.00 mg/dL   Calcium 9.1 8.9 - 10.3 mg/dL   Total Protein 6.5 6.5 - 8.1 g/dL   Albumin 3.7 3.5 - 5.0 g/dL   AST 39 15 - 41 U/L   ALT 13 0 - 44 U/L   Alkaline Phosphatase 54 38 - 126 U/L   Total Bilirubin 1.4 (H) 0.3 - 1.2 mg/dL   GFR calc non Af Amer 52 (L) >60 mL/min   GFR calc Af Amer >60 >60 mL/min    Comment: (NOTE) The eGFR has been calculated using the CKD EPI equation. This calculation has not been validated in all clinical situations. eGFR's persistently <60 mL/min signify possible Chronic Kidney Disease.    Anion gap 11 5 - 15    Comment: Performed at [facility], [place] [address] [place], [place] [address]  CBC     Status: Abnormal   Collection Time: 11/28/17 10:08 PM  Result Value Ref Range   WBC 22.4 (H) 4.0 - 10.5 K/uL   RBC 4.59 3.87 - 5.11 MIL/uL   Hemoglobin 14.7 12.0 - 15.0 g/dL   HCT 43.1 36.0 - 46.0 %   MCV 93.9 78.0 - 100.0 fL   MCH 32.0 26.0 - 34.0 pg   MCHC 34.1 30.0 - 36.0 g/dL   RDW 12.8 11.5 - 15.5 %   Platelets 349 150 - 400 K/uL    Comment: Performed at [facility], [place] [address], [place], [place] [address]   No results found.  Pending Labs Unresulted Labs (From admission, onward)   Start     Ordered    11/28/17 2046  Blood culture (routine x 2)  BLOOD CULTURE X 2,   STAT     11/28/17 2046   11/28/17 2046  Urine culture  STAT,   STAT     11/28/17 2046  Signed and Held  Basic metabolic panel  Tomorrow morning,   R     Signed and Held   Signed and Held  CBC  Tomorrow morning,   R     Signed and Held      Vitals/Pain Today's Vitals   11/28/17 2233 11/28/17 2237 11/28/17 2242 11/29/17 0019  BP:    114/63  Pulse:    84  Resp:    16  Temp:   (!) 101 F (38.3 C)   TempSrc:   Rectal   SpO2:    95%  Weight:  178 lb (80.7 kg)    Height:  5' (1.524 m)    PainSc: 6        Isolation Precautions No active isolations  Medications Medications  sodium chloride 0.9 % bolus 1,000 mL (0 mLs Intravenous Stopped 11/28/17 2311)  acetaminophen (TYLENOL) tablet 1,000 mg (1,000 mg Oral Given 11/28/17 2114)  cefTRIAXone (ROCEPHIN) 2 g in sodium chloride 0.9 % 100 mL IVPB (0 g Intravenous Stopped 11/28/17 2303)  sodium chloride 0.9 % bolus 1,000 mL (1,000 mLs Intravenous New Bag/Given 11/28/17 2215)  sodium chloride 0.9 % bolus 500 mL (0 mLs Intravenous Stopped 11/28/17 2334)    Mobility Walks normally

## 2017-11-29 NOTE — Consult Note (Signed)
Urology Consult   Physician requesting consult: Thomes Dinning  Reason for consult: Recurrent pyelonephritis, possible right hydronephrosis (new)  History of Present Illness: [name] is a 72 y.o. female well-known to our service.  She has a history of recurrent pyelonephritis in a left renal unit that has chronic mild/moderate hydronephrosis.  This is secondary to complications from lumbar surgery a few years ago.  She has had ureteral dilation and ureteral lysis by Dr. Alinda Money.  Because of her frequent episodes of pyelonephritis (this is her third admission for this in 2019), she is on chronic suppression.  Despite this, she recently developed left flank pain and signs of urinary tract infection.  She was recently placed on definitive antibiotic management with Vantin.  This has not improved her symptoms, and she is admitted for IV antibiotic management.  Recent CT revealed persistent left hydronephrosis with perinephric inflammation as well as questionable mild right hydronephrosis with prominent ureter down to the bladder.  She has no right-sided pain.  She is clinically improving with IV antibiotic management. .  Past Medical History:  Diagnosis Date  . Arthritis   . Colon polyp, hyperplastic 04/27/2012   Colonoscopy 2010.  No adenomatous polyps   . Endometrioid adenocarcinoma   . History of endometrial cancer s/p TAH BSO June2013 06/15/2011   Grade 1 endometrioid adenocarcinoma with focal superficial myometrial invasion of 0.1 cm with a myometrium of 1.8 cm or approximately 7%. She had a 5-cm tumor. No lymphovascular space Involvement.    . Hyperlipidemia   . Hypertension   . Mass of in fold of jejunum s/p SB resection 07/19/2012 07/20/2012  . Obesity (BMI 30-39.9) 04/27/2012  . Pyelonephritis 02/2016  . 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 SBO (small bowel obstruction) - recurrent 04/27/2012  . Ureteral adhesion, left 2010   s/p  ureterolysis & stenting Dr. Larwance Sachs now  . UTI (urinary tract infection)     Past Surgical History:  Procedure Laterality Date  . ABDOMINAL HYSTERECTOMY  11/02/10   TAH, BSO, lysis of adhesions for endometrial cancer  . [place], 2009   anterior L2-3, L3-4 arthrodesis  . BACK SURGERY  12/17/2012   Spinal fusion and repair   . BLADDER SUSPENSION  2003  . CATARACT EXTRACTION, BILATERAL  2003  . EYE SURGERY    . FOOT SURGERY  06/2010   left foot surgery  . HERNIA REPAIR  07/19/12   lap [place] repair  . INCISIONAL HERNIA REPAIR  11/02/10  . INSERTION OF MESH N/A 04/23/2013   Procedure: INSERTION OF MESH;  Surgeon: Adin Hector, MD;  Location: [facility];  Service: General;  Laterality: N/A;  . JOINT REPLACEMENT  09/2009   Right total knee  . KIDNEY SURGERY  2010   Left kidney  . LAPAROSCOPIC LYSIS OF ADHESIONS N/A 04/23/2013   Procedure: LAPAROSCOPIC LYSIS OF ADHESIONS;  Surgeon: Adin Hector, MD;  Location: [facility];  Service: General;  Laterality: N/A;  . TOTAL HIP ARTHROPLASTY  01/2010   Right total hip  . TOTAL HIP ARTHROPLASTY Left 07/04/2014   Procedure: LEFT TOTAL HIP ARTHROPLASTY ANTERIOR APPROACH;  Surgeon: Gearlean Alf, MD;  Location: [facility];  Service: Orthopedics;  Laterality: Left;  . TOTAL KNEE ARTHROPLASTY     right  . URETEROLYSIS  2010   lap/open left ureterolysis & omental flap, ureter stent  . VENTRAL HERNIA REPAIR N/A 07/19/2012   Procedure: LAPAROSCOPIC LYSIS OF ADHESIONS,  SMALL BOWEL RESECTION, SEROSAL REPAIR, PRIMARY VENTRAL HERNIA REPAIR;  Surgeon: Adin Hector, MD;  Location: [facility];  Service: General;  Laterality: N/A;  . VENTRAL HERNIA REPAIR N/A 04/23/2013   Procedure: LAPAROSCOPIC exploration and repair of hernia in abdominal VENTRAL wall  HERNIA;  Surgeon: Adin Hector, MD;  Location: [facility];  Service: General;  Laterality: N/A;     Current Hospital Medications: Scheduled Meds: . amitriptyline  25 mg Oral QHS  .  enoxaparin (LOVENOX) injection  40 mg Subcutaneous Daily  . gabapentin  300 mg Oral QHS  . iopamidol       Continuous Infusions: . cefTRIAXone (ROCEPHIN)  IV     PRN Meds:.acetaminophen **OR** acetaminophen, methocarbamol, ondansetron **OR** ondansetron (ZOFRAN) IV, oxyCODONE-acetaminophen, polyethylene glycol, traZODone  Allergies:  Allergies  Allergen Reactions  . Cashew Nut Oil Hives  . Dog Epithelium Hives and Itching  . Ondansetron Hcl Nausea And Vomiting    "made me throw up"  . Sulfa Antibiotics     "blistering lips and mouth sores"  . Sulfamethoxazole     Mouth sores  . Zofran Hives and Nausea Only    Family History  Problem Relation Age of Onset  . Diabetes Mother   . Cancer Mother        lung  . Diabetes Father   . Cancer Father        liver  . Colon cancer Sister   . Diabetes Sister   . Cancer Sister        colon  . Breast cancer Neg Hx   . Allergic rhinitis Neg Hx   . Angioedema Neg Hx   . Asthma Neg Hx   . Atopy Neg Hx   . Eczema Neg Hx   . Immunodeficiency Neg Hx   . Urticaria Neg Hx     Social History:  reports that she has never smoked. She has never used smokeless tobacco. She reports that she does not drink alcohol or use drugs.  ROS: A complete review of systems was performed.  All systems are negative except for pertinent findings as noted.  Physical Exam:  Vital signs in last 24 hours: Temp:  [98.4 F (36.9 C)-102.1 F (38.9 C)] 98.4 F (36.9 C) (07/24 1353) Pulse Rate:  [70-91] 70 (07/24 1353) Resp:  [16-23] 16 (07/24 1353) BP: (104-143)/(51-69) 113/51 (07/24 1353) SpO2:  [94 %-98 %] 95 % (07/24 1353) Weight:  [80.7 kg (178 lb)] 80.7 kg (178 lb) (07/23 2237) General:  Alert and oriented, No acute distress HEENT: Normocephalic, atraumatic Neck: No JVD or lymphadenopathy Cardiovascular: Regular rate Lungs: normal inspiratory/expiratory excursion Abdomen: Obese.  Left CVA and left lower quadrant tenderness.  No right CVA or lower  quadrant tenderness. Extremities: No edema Neurologic: Grossly intact  Laboratory Data:  Recent Labs    11/28/17 2208 11/29/17 0219  WBC 22.4* 20.0*  HGB 14.7 12.8  HCT 43.1 38.5  PLT 349 274    Recent Labs    11/28/17 2208 11/29/17 0219  NA 137 137  K 5.3* 4.3  CL 102 107  GLUCOSE 146* 195*  BUN 9 9  CALCIUM 9.1 7.8*  CREATININE 1.04* 1.03*     Results for orders placed or performed during the hospital encounter of 11/28/17 (from the past 24 hour(s))  Urinalysis, Routine w reflex microscopic     Status: Abnormal   Collection Time: 11/28/17  8:29 PM  Result Value Ref Range   Color, Urine AMBER (A) YELLOW   APPearance CLOUDY (  A) CLEAR   Specific Gravity, Urine 1.016 1.005 - 1.030   pH 6.0 5.0 - 8.0   Glucose, UA NEGATIVE NEGATIVE mg/dL   Hgb urine dipstick SMALL (A) NEGATIVE   Bilirubin Urine NEGATIVE NEGATIVE   Ketones, ur NEGATIVE NEGATIVE mg/dL   Protein, ur 100 (A) NEGATIVE mg/dL   Nitrite POSITIVE (A) NEGATIVE   Leukocytes, UA MODERATE (A) NEGATIVE   RBC / HPF >50 (H) 0 - 5 RBC/hpf   WBC, UA >50 (H) 0 - 5 WBC/hpf   Bacteria, UA RARE (A) NONE SEEN   Mucus PRESENT   I-Stat CG4 Lactic Acid, ED     Status: Abnormal   Collection Time: 11/28/17 10:01 PM  Result Value Ref Range   Lactic Acid, Venous 3.49 (HH) 0.5 - 1.9 mmol/L   Comment NOTIFIED PHYSICIAN   Lipase, blood     Status: None   Collection Time: 11/28/17 10:08 PM  Result Value Ref Range   Lipase 34 11 - 51 U/L  Comprehensive metabolic panel     Status: Abnormal   Collection Time: 11/28/17 10:08 PM  Result Value Ref Range   Sodium 137 135 - 145 mmol/L   Potassium 5.3 (H) 3.5 - 5.1 mmol/L   Chloride 102 98 - 111 mmol/L   CO2 24 22 - 32 mmol/L   Glucose, Bld 146 (H) 70 - 99 mg/dL   BUN 9 8 - 23 mg/dL   Creatinine, Ser 1.04 (H) 0.44 - 1.00 mg/dL   Calcium 9.1 8.9 - 10.3 mg/dL   Total Protein 6.5 6.5 - 8.1 g/dL   Albumin 3.7 3.5 - 5.0 g/dL   AST 39 15 - 41 U/L   ALT 13 0 - 44 U/L   Alkaline  Phosphatase 54 38 - 126 U/L   Total Bilirubin 1.4 (H) 0.3 - 1.2 mg/dL   GFR calc non Af Amer 52 (L) >60 mL/min   GFR calc Af Amer >60 >60 mL/min   Anion gap 11 5 - 15  CBC     Status: Abnormal   Collection Time: 11/28/17 10:08 PM  Result Value Ref Range   WBC 22.4 (H) 4.0 - 10.5 K/uL   RBC 4.59 3.87 - 5.11 MIL/uL   Hemoglobin 14.7 12.0 - 15.0 g/dL   HCT 43.1 36.0 - 46.0 %   MCV 93.9 78.0 - 100.0 fL   MCH 32.0 26.0 - 34.0 pg   MCHC 34.1 30.0 - 36.0 g/dL   RDW 12.8 11.5 - 15.5 %   Platelets 349 150 - 400 K/uL  Basic metabolic panel     Status: Abnormal   Collection Time: 11/29/17  2:19 AM  Result Value Ref Range   Sodium 137 135 - 145 mmol/L   Potassium 4.3 3.5 - 5.1 mmol/L   Chloride 107 98 - 111 mmol/L   CO2 19 (L) 22 - 32 mmol/L   Glucose, Bld 195 (H) 70 - 99 mg/dL   BUN 9 8 - 23 mg/dL   Creatinine, Ser 1.03 (H) 0.44 - 1.00 mg/dL   Calcium 7.8 (L) 8.9 - 10.3 mg/dL   GFR calc non Af Amer 53 (L) >60 mL/min   GFR calc Af Amer >60 >60 mL/min   Anion gap 11 5 - 15  CBC     Status: Abnormal   Collection Time: 11/29/17  2:19 AM  Result Value Ref Range   WBC 20.0 (H) 4.0 - 10.5 K/uL   RBC 3.99 3.87 - 5.11 MIL/uL   Hemoglobin 12.8 12.0 -  15.0 g/dL   HCT 38.5 36.0 - 46.0 %   MCV 96.5 78.0 - 100.0 fL   MCH 32.1 26.0 - 34.0 pg   MCHC 33.2 30.0 - 36.0 g/dL   RDW 12.9 11.5 - 15.5 %   Platelets 274 150 - 400 K/uL  Lactic acid, plasma     Status: Abnormal   Collection Time: 11/29/17  2:19 AM  Result Value Ref Range   Lactic Acid, Venous 3.1 (HH) 0.5 - 1.9 mmol/L  Lactic acid, plasma     Status: Abnormal   Collection Time: 11/29/17  5:37 AM  Result Value Ref Range   Lactic Acid, Venous 2.2 (HH) 0.5 - 1.9 mmol/L  Lactic acid, plasma     Status: None   Collection Time: 11/29/17  8:04 AM  Result Value Ref Range   Lactic Acid, Venous 1.6 0.5 - 1.9 mmol/L   No results found for this or any previous visit (from the past 240 hour(s)).  Renal Function: Recent Labs     11/28/17 2208 11/29/17 0219  CREATININE 1.04* 1.03*   Estimated Creatinine Clearance: 45.8 mL/min (A) (by C-G formula based on SCr of 1.03 mg/dL (H)).  Radiologic Imaging: Ct Abdomen Pelvis Wo Contrast  Result Date: 11/29/2017 CLINICAL DATA:  Chronic left flank pain, worse since yesterday. Taking antibiotics for urinary tract infection. Fever. Clinical concern for abscess. EXAM: CT ABDOMEN AND PELVIS WITHOUT CONTRAST TECHNIQUE: Multidetector CT imaging of the abdomen and pelvis was performed following the standard protocol without IV contrast. COMPARISON:  Renal ultrasound dated 10/21/2017 and abdomen and pelvis CT dated 08/01/2017. FINDINGS: Lower chest: Small left pleural effusion. Mild left basilar atelectasis and minimal right basilar atelectasis. Hepatobiliary: No focal liver abnormality is seen. No gallstones, gallbladder wall thickening, or biliary dilatation. Pancreas: Unremarkable. No pancreatic ductal dilatation or surrounding inflammatory changes. Spleen: Normal in size without focal abnormality. Adrenals/Urinary Tract: Normal appearing adrenal glands. Interval mild dilatation of the right renal collecting system and ureter to the level of the urinary bladder with no obstructing mass or calculus seen. Marked dilatation of the left renal collecting system and mild dilatation of the left ureter the level of the urinary bladder with no obstructing mass or calculus visualized. Mild asymmetrical left renal atrophy is stable. Left perinephric soft tissue stranding with improvement. Small amount of fluid and soft tissue stranding in the left pararenal space extending inferiorly into the upper pelvis. No fluid collections suspicious for abscess. Stomach/Bowel: Unremarkable stomach, small bowel and colon. No evidence of appendicitis. Vascular/Lymphatic: Atheromatous arterial calcifications without aneurysm. No enlarged lymph nodes. Reproductive: Status post hysterectomy. No adnexal masses. Other: Midline  surgical scar. Musculoskeletal: Bilateral hip prostheses. Lumbar spine laminectomy defects with anterior posterior fixation hardware at the L2 through L4 levels and Ray cages at the L4-5 level. Extensive lumbar and lower thoracic spine degenerative changes. IMPRESSION: 1. Interval mild right hydronephrosis and hydroureter with no obstructing stone or mass visualized. 2. Stable marked left hydronephrosis and mild left hydroureter with no obstructing stone or mass visualized. This remains compatible with a chronic partial left UPJ obstruction with associated mild left renal atrophy. 3. Mildly improved perinephric edema on the left with increased pararenal edema and fluid extending into the upper left pelvis. 4. Interval small left pleural effusion. 5. No findings suspicious for abscess formation. Electronically Signed   By: Claudie Revering M.D.   On: 11/29/2017 12:30    I independently reviewed the above imaging studies.  Impression/Assessment:  1.  Recent radiographic evidence of mild  right hydroureteronephrosis.  I do not think, on review, that this is significant.  I do not think that this is causing any of her symptoms  2.  Left hydronephrosis with associated pyelonephritis.  She is clinically improving with IV antibiotic management.  Plan:  1.  I do not think further intervention is needed with her right renal unit at this time, as I do not strongly suspect involvement of the right kidney either with poor drainage or infection  2.  Continue adequate IV antibiotic management at the present time  3.  Unless she does not have significant improvement in her clinical status with IV antibiotics, further imaging or intervention is not necessary.  It would be worthwhile to follow her up as an outpatient (she did have an appointment scheduled today) for discussion of further management

## 2017-11-29 NOTE — Progress Notes (Signed)
CRITICAL VALUE ALERT  Critical Value: Lactic Acid 3.1  Date & Time Notied:  11/29/17  0255  Provider Notified: Lamar Blinks, NP  Orders Received/Actions taken: 1 L bolus at 500 mL/hr

## 2017-11-29 NOTE — Progress Notes (Signed)
CRITICAL VALUE ALERT  Critical Value:  Lactic Acid 2.2  Date & Time Notied: 11/29/17 7253  Provider Notified: Lamar Blinks, NP   Orders Received/Actions taken: 500 mL NS bolus

## 2017-11-29 NOTE — H&P (View-Only) (Signed)
Urology Consult   Physician requesting consult: Thomes Dinning  Reason for consult: Recurrent pyelonephritis, possible right hydronephrosis (new)  History of Present Illness: [name] is a 72 y.o. female well-known to our service.  She has a history of recurrent pyelonephritis in a left renal unit that has chronic mild/moderate hydronephrosis.  This is secondary to complications from lumbar surgery a few years ago.  She has had ureteral dilation and ureteral lysis by Dr. Alinda Money.  Because of her frequent episodes of pyelonephritis (this is her third admission for this in 2019), she is on chronic suppression.  Despite this, she recently developed left flank pain and signs of urinary tract infection.  She was recently placed on definitive antibiotic management with Vantin.  This has not improved her symptoms, and she is admitted for IV antibiotic management.  Recent CT revealed persistent left hydronephrosis with perinephric inflammation as well as questionable mild right hydronephrosis with prominent ureter down to the bladder.  She has no right-sided pain.  She is clinically improving with IV antibiotic management. .  Past Medical History:  Diagnosis Date  . Arthritis   . Colon polyp, hyperplastic 04/27/2012   Colonoscopy 2010.  No adenomatous polyps   . Endometrioid adenocarcinoma   . History of endometrial cancer s/p TAH BSO June2013 06/15/2011   Grade 1 endometrioid adenocarcinoma with focal superficial myometrial invasion of 0.1 cm with a myometrium of 1.8 cm or approximately 7%. She had a 5-cm tumor. No lymphovascular space Involvement.    . Hyperlipidemia   . Hypertension   . Mass of in fold of jejunum s/p SB resection 07/19/2012 07/20/2012  . Obesity (BMI 30-39.9) 04/27/2012  . Pyelonephritis 02/2016  . 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 SBO (small bowel obstruction) - recurrent 04/27/2012  . Ureteral adhesion, left 2010   s/p  ureterolysis & stenting Dr. Larwance Sachs now  . UTI (urinary tract infection)     Past Surgical History:  Procedure Laterality Date  . ABDOMINAL HYSTERECTOMY  11/02/10   TAH, BSO, lysis of adhesions for endometrial cancer  . Eva, 2009   anterior L2-3, L3-4 arthrodesis  . BACK SURGERY  12/17/2012   Spinal fusion and repair   . BLADDER SUSPENSION  2003  . CATARACT EXTRACTION, BILATERAL  2003  . EYE SURGERY    . FOOT SURGERY  06/2010   left foot surgery  . HERNIA REPAIR  07/19/12   lap [place] repair  . INCISIONAL HERNIA REPAIR  11/02/10  . INSERTION OF MESH N/A 04/23/2013   Procedure: INSERTION OF MESH;  Surgeon: Adin Hector, MD;  Location: [facility];  Service: General;  Laterality: N/A;  . JOINT REPLACEMENT  09/2009   Right total knee  . KIDNEY SURGERY  2010   Left kidney  . LAPAROSCOPIC LYSIS OF ADHESIONS N/A 04/23/2013   Procedure: LAPAROSCOPIC LYSIS OF ADHESIONS;  Surgeon: Adin Hector, MD;  Location: [facility];  Service: General;  Laterality: N/A;  . TOTAL HIP ARTHROPLASTY  01/2010   Right total hip  . TOTAL HIP ARTHROPLASTY Left 07/04/2014   Procedure: LEFT TOTAL HIP ARTHROPLASTY ANTERIOR APPROACH;  Surgeon: Gearlean Alf, MD;  Location: [facility];  Service: Orthopedics;  Laterality: Left;  . TOTAL KNEE ARTHROPLASTY     right  . URETEROLYSIS  2010   lap/open left ureterolysis & omental flap, ureter stent  . VENTRAL HERNIA REPAIR N/A 07/19/2012   Procedure: LAPAROSCOPIC LYSIS OF ADHESIONS,  SMALL BOWEL RESECTION, SEROSAL REPAIR, PRIMARY VENTRAL HERNIA REPAIR;  Surgeon: Adin Hector, MD;  Location: [facility];  Service: General;  Laterality: N/A;  . VENTRAL HERNIA REPAIR N/A 04/23/2013   Procedure: LAPAROSCOPIC exploration and repair of hernia in abdominal VENTRAL wall  HERNIA;  Surgeon: Adin Hector, MD;  Location: [facility];  Service: General;  Laterality: N/A;     Current Hospital Medications: Scheduled Meds: . amitriptyline  25 mg Oral QHS  .  enoxaparin (LOVENOX) injection  40 mg Subcutaneous Daily  . gabapentin  300 mg Oral QHS  . iopamidol       Continuous Infusions: . cefTRIAXone (ROCEPHIN)  IV     PRN Meds:.acetaminophen **OR** acetaminophen, methocarbamol, ondansetron **OR** ondansetron (ZOFRAN) IV, oxyCODONE-acetaminophen, polyethylene glycol, traZODone  Allergies:  Allergies  Allergen Reactions  . Cashew Nut Oil Hives  . Dog Epithelium Hives and Itching  . Ondansetron Hcl Nausea And Vomiting    "made me throw up"  . Sulfa Antibiotics     "blistering lips and mouth sores"  . Sulfamethoxazole     Mouth sores  . Zofran Hives and Nausea Only    Family History  Problem Relation Age of Onset  . Diabetes Mother   . Cancer Mother        lung  . Diabetes Father   . Cancer Father        liver  . Colon cancer Sister   . Diabetes Sister   . Cancer Sister        colon  . Breast cancer Neg Hx   . Allergic rhinitis Neg Hx   . Angioedema Neg Hx   . Asthma Neg Hx   . Atopy Neg Hx   . Eczema Neg Hx   . Immunodeficiency Neg Hx   . Urticaria Neg Hx     Social History:  reports that she has never smoked. She has never used smokeless tobacco. She reports that she does not drink alcohol or use drugs.  ROS: A complete review of systems was performed.  All systems are negative except for pertinent findings as noted.  Physical Exam:  Vital signs in last 24 hours: Temp:  [98.4 F (36.9 C)-102.1 F (38.9 C)] 98.4 F (36.9 C) (07/24 1353) Pulse Rate:  [70-91] 70 (07/24 1353) Resp:  [16-23] 16 (07/24 1353) BP: (104-143)/(51-69) 113/51 (07/24 1353) SpO2:  [94 %-98 %] 95 % (07/24 1353) Weight:  [80.7 kg (178 lb)] 80.7 kg (178 lb) (07/23 2237) General:  Alert and oriented, No acute distress HEENT: Normocephalic, atraumatic Neck: No JVD or lymphadenopathy Cardiovascular: Regular rate Lungs: normal inspiratory/expiratory excursion Abdomen: Obese.  Left CVA and left lower quadrant tenderness.  No right CVA or lower  quadrant tenderness. Extremities: No edema Neurologic: Grossly intact  Laboratory Data:  Recent Labs    11/28/17 2208 11/29/17 0219  WBC 22.4* 20.0*  HGB 14.7 12.8  HCT 43.1 38.5  PLT 349 274    Recent Labs    11/28/17 2208 11/29/17 0219  NA 137 137  K 5.3* 4.3  CL 102 107  GLUCOSE 146* 195*  BUN 9 9  CALCIUM 9.1 7.8*  CREATININE 1.04* 1.03*     Results for orders placed or performed during the hospital encounter of 11/28/17 (from the past 24 hour(s))  Urinalysis, Routine w reflex microscopic     Status: Abnormal   Collection Time: 11/28/17  8:29 PM  Result Value Ref Range   Color, Urine AMBER (A) YELLOW   APPearance CLOUDY (  A) CLEAR   Specific Gravity, Urine 1.016 1.005 - 1.030   pH 6.0 5.0 - 8.0   Glucose, UA NEGATIVE NEGATIVE mg/dL   Hgb urine dipstick SMALL (A) NEGATIVE   Bilirubin Urine NEGATIVE NEGATIVE   Ketones, ur NEGATIVE NEGATIVE mg/dL   Protein, ur 100 (A) NEGATIVE mg/dL   Nitrite POSITIVE (A) NEGATIVE   Leukocytes, UA MODERATE (A) NEGATIVE   RBC / HPF >50 (H) 0 - 5 RBC/hpf   WBC, UA >50 (H) 0 - 5 WBC/hpf   Bacteria, UA RARE (A) NONE SEEN   Mucus PRESENT   I-Stat CG4 Lactic Acid, ED     Status: Abnormal   Collection Time: 11/28/17 10:01 PM  Result Value Ref Range   Lactic Acid, Venous 3.49 (HH) 0.5 - 1.9 mmol/L   Comment NOTIFIED PHYSICIAN   Lipase, blood     Status: None   Collection Time: 11/28/17 10:08 PM  Result Value Ref Range   Lipase 34 11 - 51 U/L  Comprehensive metabolic panel     Status: Abnormal   Collection Time: 11/28/17 10:08 PM  Result Value Ref Range   Sodium 137 135 - 145 mmol/L   Potassium 5.3 (H) 3.5 - 5.1 mmol/L   Chloride 102 98 - 111 mmol/L   CO2 24 22 - 32 mmol/L   Glucose, Bld 146 (H) 70 - 99 mg/dL   BUN 9 8 - 23 mg/dL   Creatinine, Ser 1.04 (H) 0.44 - 1.00 mg/dL   Calcium 9.1 8.9 - 10.3 mg/dL   Total Protein 6.5 6.5 - 8.1 g/dL   Albumin 3.7 3.5 - 5.0 g/dL   AST 39 15 - 41 U/L   ALT 13 0 - 44 U/L   Alkaline  Phosphatase 54 38 - 126 U/L   Total Bilirubin 1.4 (H) 0.3 - 1.2 mg/dL   GFR calc non Af Amer 52 (L) >60 mL/min   GFR calc Af Amer >60 >60 mL/min   Anion gap 11 5 - 15  CBC     Status: Abnormal   Collection Time: 11/28/17 10:08 PM  Result Value Ref Range   WBC 22.4 (H) 4.0 - 10.5 K/uL   RBC 4.59 3.87 - 5.11 MIL/uL   Hemoglobin 14.7 12.0 - 15.0 g/dL   HCT 43.1 36.0 - 46.0 %   MCV 93.9 78.0 - 100.0 fL   MCH 32.0 26.0 - 34.0 pg   MCHC 34.1 30.0 - 36.0 g/dL   RDW 12.8 11.5 - 15.5 %   Platelets 349 150 - 400 K/uL  Basic metabolic panel     Status: Abnormal   Collection Time: 11/29/17  2:19 AM  Result Value Ref Range   Sodium 137 135 - 145 mmol/L   Potassium 4.3 3.5 - 5.1 mmol/L   Chloride 107 98 - 111 mmol/L   CO2 19 (L) 22 - 32 mmol/L   Glucose, Bld 195 (H) 70 - 99 mg/dL   BUN 9 8 - 23 mg/dL   Creatinine, Ser 1.03 (H) 0.44 - 1.00 mg/dL   Calcium 7.8 (L) 8.9 - 10.3 mg/dL   GFR calc non Af Amer 53 (L) >60 mL/min   GFR calc Af Amer >60 >60 mL/min   Anion gap 11 5 - 15  CBC     Status: Abnormal   Collection Time: 11/29/17  2:19 AM  Result Value Ref Range   WBC 20.0 (H) 4.0 - 10.5 K/uL   RBC 3.99 3.87 - 5.11 MIL/uL   Hemoglobin 12.8 12.0 -  15.0 g/dL   HCT 38.5 36.0 - 46.0 %   MCV 96.5 78.0 - 100.0 fL   MCH 32.1 26.0 - 34.0 pg   MCHC 33.2 30.0 - 36.0 g/dL   RDW 12.9 11.5 - 15.5 %   Platelets 274 150 - 400 K/uL  Lactic acid, plasma     Status: Abnormal   Collection Time: 11/29/17  2:19 AM  Result Value Ref Range   Lactic Acid, Venous 3.1 (HH) 0.5 - 1.9 mmol/L  Lactic acid, plasma     Status: Abnormal   Collection Time: 11/29/17  5:37 AM  Result Value Ref Range   Lactic Acid, Venous 2.2 (HH) 0.5 - 1.9 mmol/L  Lactic acid, plasma     Status: None   Collection Time: 11/29/17  8:04 AM  Result Value Ref Range   Lactic Acid, Venous 1.6 0.5 - 1.9 mmol/L   No results found for this or any previous visit (from the past 240 hour(s)).  Renal Function: Recent Labs     11/28/17 2208 11/29/17 0219  CREATININE 1.04* 1.03*   Estimated Creatinine Clearance: 45.8 mL/min (A) (by C-G formula based on SCr of 1.03 mg/dL (H)).  Radiologic Imaging: Ct Abdomen Pelvis Wo Contrast  Result Date: 11/29/2017 CLINICAL DATA:  Chronic left flank pain, worse since yesterday. Taking antibiotics for urinary tract infection. Fever. Clinical concern for abscess. EXAM: CT ABDOMEN AND PELVIS WITHOUT CONTRAST TECHNIQUE: Multidetector CT imaging of the abdomen and pelvis was performed following the standard protocol without IV contrast. COMPARISON:  Renal ultrasound dated 10/21/2017 and abdomen and pelvis CT dated 08/01/2017. FINDINGS: Lower chest: Small left pleural effusion. Mild left basilar atelectasis and minimal right basilar atelectasis. Hepatobiliary: No focal liver abnormality is seen. No gallstones, gallbladder wall thickening, or biliary dilatation. Pancreas: Unremarkable. No pancreatic ductal dilatation or surrounding inflammatory changes. Spleen: Normal in size without focal abnormality. Adrenals/Urinary Tract: Normal appearing adrenal glands. Interval mild dilatation of the right renal collecting system and ureter to the level of the urinary bladder with no obstructing mass or calculus seen. Marked dilatation of the left renal collecting system and mild dilatation of the left ureter the level of the urinary bladder with no obstructing mass or calculus visualized. Mild asymmetrical left renal atrophy is stable. Left perinephric soft tissue stranding with improvement. Small amount of fluid and soft tissue stranding in the left pararenal space extending inferiorly into the upper pelvis. No fluid collections suspicious for abscess. Stomach/Bowel: Unremarkable stomach, small bowel and colon. No evidence of appendicitis. Vascular/Lymphatic: Atheromatous arterial calcifications without aneurysm. No enlarged lymph nodes. Reproductive: Status post hysterectomy. No adnexal masses. Other: Midline  surgical scar. Musculoskeletal: Bilateral hip prostheses. Lumbar spine laminectomy defects with anterior posterior fixation hardware at the L2 through L4 levels and Ray cages at the L4-5 level. Extensive lumbar and lower thoracic spine degenerative changes. IMPRESSION: 1. Interval mild right hydronephrosis and hydroureter with no obstructing stone or mass visualized. 2. Stable marked left hydronephrosis and mild left hydroureter with no obstructing stone or mass visualized. This remains compatible with a chronic partial left UPJ obstruction with associated mild left renal atrophy. 3. Mildly improved perinephric edema on the left with increased pararenal edema and fluid extending into the upper left pelvis. 4. Interval small left pleural effusion. 5. No findings suspicious for abscess formation. Electronically Signed   By: Claudie Revering M.D.   On: 11/29/2017 12:30    I independently reviewed the above imaging studies.  Impression/Assessment:  1.  Recent radiographic evidence of mild  right hydroureteronephrosis.  I do not think, on review, that this is significant.  I do not think that this is causing any of her symptoms  2.  Left hydronephrosis with associated pyelonephritis.  She is clinically improving with IV antibiotic management.  Plan:  1.  I do not think further intervention is needed with her right renal unit at this time, as I do not strongly suspect involvement of the right kidney either with poor drainage or infection  2.  Continue adequate IV antibiotic management at the present time  3.  Unless she does not have significant improvement in her clinical status with IV antibiotics, further imaging or intervention is not necessary.  It would be worthwhile to follow her up as an outpatient (she did have an appointment scheduled today) for discussion of further management

## 2017-11-30 LAB — BASIC METABOLIC PANEL WITH GFR
Anion gap: 6 (ref 5–15)
Calcium: 8.3 mg/dL — ABNORMAL LOW (ref 8.9–10.3)
GFR calc Af Amer: >60 mL/min (ref >60)
GFR calc non Af Amer: >60 mL/min (ref >60)
Potassium: 3.8 mmol/L (ref 3.5–5.1)
Sodium: 141 mmol/L (ref 135–145)

## 2017-11-30 LAB — CBC
HCT: 35.8 % — ABNORMAL LOW (ref 36.0–46.0)
Hemoglobin: 11.8 g/dL — ABNORMAL LOW (ref 12.0–15.0)
MCH: 31.5 pg (ref 26.0–34.0)
MCHC: 33 g/dL (ref 30.0–36.0)
MCV: 95.5 fL (ref 78.0–100.0)
Platelets: 259 K/uL (ref 150–400)
RBC: 3.75 MIL/uL — ABNORMAL LOW (ref 3.87–5.11)
RDW: 13.1 % (ref 11.5–15.5)
WBC: 11.7 K/uL — ABNORMAL HIGH (ref 4.0–10.5)

## 2017-11-30 LAB — BASIC METABOLIC PANEL
BUN: 8 mg/dL (ref 8–23)
CO2: 25 mmol/L (ref 22–32)
Chloride: 110 mmol/L (ref 98–111)
Creatinine, Ser: 0.77 mg/dL (ref 0.44–1.00)
Glucose, Bld: 107 mg/dL — ABNORMAL HIGH (ref 70–99)

## 2017-11-30 LAB — MAGNESIUM: Magnesium: 2 mg/dL (ref 1.7–2.4)

## 2017-11-30 MED ORDER — SODIUM CHLORIDE 0.9 % IV SOLN
1.0000 g | INTRAVENOUS | Status: DC
  Administered 2017-11-30: 1 g via INTRAVENOUS
  Filled 2017-11-30: qty 1
  Filled 2017-11-30: qty 10

## 2017-11-30 NOTE — Progress Notes (Addendum)
Patient ID: [name], female   DOB: [birth year], 72 y.o.   MRN: [id number]    Subjective: Pt feeling better.  Still with mild left flank pain.  Objective: Vital signs in last 24 hours: Temp:  [98.4 F (36.9 C)-99.4 F (37.4 C)] 99.4 F (37.4 C) (07/25 0527) Pulse Rate:  [70-73] 71 (07/25 0527) Resp:  [16-20] 18 (07/25 0527) BP: (113-148)/(51-67) 129/55 (07/25 0527) SpO2:  [95 %-96 %] 96 % (07/25 0527)  Intake/Output from previous day: 07/24 0701 - 07/25 0700 In: 1783.3 [P.O.:900; I.V.:783.3; IV Piggyback:100] Out: 3800 [Urine:3800] Intake/Output this shift: Total I/O In: 240 [P.O.:240] Out: -   Physical Exam:  General: Alert and oriented Abd: Mild left CVAT  Lab Results: Recent Labs    11/28/17 2208 11/29/17 0219 11/30/17 0513  HGB 14.7 12.8 11.8*  HCT 43.1 38.5 35.8*   CBC Latest Ref Rng & Units 11/30/2017 11/29/2017 11/28/2017  WBC 4.0 - 10.5 K/uL 11.7(H) 20.0(H) 22.4(H)  Hemoglobin 12.0 - 15.0 g/dL 11.8(L) 12.8 14.7  Hematocrit 36.0 - 46.0 % 35.8(L) 38.5 43.1  Platelets 150 - 400 K/uL 259 274 349     BMET Recent Labs    11/29/17 0219 11/30/17 0513  NA 137 141  K 4.3 3.8  CL 107 110  CO2 19* 25  GLUCOSE 195* 107*  BUN 9 8  CREATININE 1.03* 0.77  CALCIUM 7.8* 8.3*     Studies/Results:   Assessment/Plan: Left pyelonephritis with left hydronephrosis:  Continue IV antibiotics pending final cultures.  She does not require urgent ureteral stenting but I did discuss her recent history of multiple episodes of pyelonephritis.  She is agreeable to elective ureteral stenting of the left ureter next week to see if this may decrease her risk of recurrent renal infections.  She can be discharged home on appropriate oral antibiotics once her culture returns.     LOS: 2 days   Ayse Mccartin,LES 11/30/2017, 12:38 PM

## 2017-11-30 NOTE — Progress Notes (Signed)
PROGRESS NOTE    [name]  XBM:[id number] DOB: 1946/02/02 DOA: 11/28/2017 PCP: [name], MD    Brief Narrative:  72 year old with past medical history relevant for hypertension, depression/anxiety, chronic pain, chronic left-sided hydronephrosis, complicated by recurrent episodes of pyelonephritis who comes in with sepsis secondary to likely left-sided pyelonephritis.   Assessment & Plan:   Active Problems:   Hypertension   Depression   History of hydronephrosis   Pyelonephritis   #) Pyelonephritis/left-sided hydronephrosis: Prior imaging is redemonstrated persistent left-sided hydronephrosis which is been noted on both ultrasound and CT scan since 2017.   -Noncontrast CT on 11/29/2017 showed known severe left-sided hydronephrosis and mild new onset right-sided hydronephrosis -Urology felt that patient should be treated medically at this time no follow-up as an outpatient -Continue IV ceftriaxone 2 g daily started 11/28/2017 - blood cultures x2 ordered 11/28/2017 no growth to date -  urine culture from 11/27/2017 growing 30,000 colonies of gram-negative rods  #) Hypertension: -Hold lisinopril 10 mg daily -Hold HCTZ 12.5 mg daily  #) Pain/psych: -Continue amitriptyline 25 mg nightly -Continue gabapentin 600 mg nightly  Fluids: Tolerating p.o. Electrolytes: Monitor and supplement Nutrition: Heart healthy diet  Prophylaxis: Enoxaparin  Disposition: Pending susceptibilities and transitioning to oral antibiotics  Full code   Consultants:   Urology  Procedures:   None  Antimicrobials:  IV ceftriaxone 2 g started 720 06/11/2017   Subjective: Patient reports she is doing much better.  She reports her flank pain is completely resolved.  She denies any nausea, vomiting, diarrhea.  She was seen by urology who felt that at this time she did not need any intervention but they would follow-up as an outpatient for her chronic left-sided  hydronephrosis..  Objective: Vitals:   11/29/17 0453 11/29/17 1353 11/29/17 2155 11/30/17 0527  BP: (!) 143/69 (!) 113/51 (!) 148/67 (!) 129/55  Pulse: 75 70 73 71  Resp: 18 16 20 18   Temp: 100.1 F (37.8 C) 98.4 F (36.9 C) 99 F (37.2 C) 99.4 F (37.4 C)  TempSrc: Oral Oral Oral Oral  SpO2: 96% 95% 96% 96%  Weight:      Height:        Intake/Output Summary (Last 24 hours) at 11/30/2017 1033 Last data filed at 11/30/2017 0838 Gross per 24 hour  Intake 2023.33 ml  Output 2800 ml  Net -776.67 ml   Filed Weights   11/28/17 2237  Weight: 80.7 kg (178 lb)    Examination:  General exam: Appears calm and comfortable  Respiratory system: Clear to auscultation. Respiratory effort normal. Cardiovascular system: Regular rate and rhythm, no murmurs Gastrointestinal system: Soft, nondistended, no rebound or guarding, no flank pain, plus bowel sounds Central nervous system: Alert and oriented. No focal neurological deficits. Extremities: Trace lower extremity edema Skin: No rashes on visible skin Psychiatry: Judgement and insight appear normal. Mood & affect appropriate.     Data Reviewed: I have personally reviewed following labs and imaging studies  CBC: Recent Labs  Lab 11/28/17 2208 11/29/17 0219 11/30/17 0513  WBC 22.4* 20.0* 11.7*  HGB 14.7 12.8 11.8*  HCT 43.1 38.5 35.8*  MCV 93.9 96.5 95.5  PLT 349 274 027   Basic Metabolic Panel: Recent Labs  Lab 11/28/17 2208 11/29/17 0219 11/30/17 0513  NA 137 137 141  K 5.3* 4.3 3.8  CL 102 107 110  CO2 24 19* 25  GLUCOSE 146* 195* 107*  BUN 9 9 8   CREATININE 1.04* 1.03* 0.77  CALCIUM 9.1 7.8* 8.3*  MG  --   --  2.0   GFR: Estimated Creatinine Clearance: 58.9 mL/min (by C-G formula based on SCr of 0.77 mg/dL). Liver Function Tests: Recent Labs  Lab 11/28/17 2208  AST 39  ALT 13  ALKPHOS 54  BILITOT 1.4*  PROT 6.5  ALBUMIN 3.7   Recent Labs  Lab 11/28/17 2208  LIPASE 34   No results for input(s):  AMMONIA in the last 168 hours. Coagulation Profile: No results for input(s): INR, PROTIME in the last 168 hours. Cardiac Enzymes: No results for input(s): CKTOTAL, CKMB, CKMBINDEX, TROPONINI in the last 168 hours. BNP (last 3 results) No results for input(s): PROBNP in the last 8760 hours. HbA1C: No results for input(s): HGBA1C in the last 72 hours. CBG: No results for input(s): GLUCAP in the last 168 hours. Lipid Profile: No results for input(s): CHOL, HDL, LDLCALC, TRIG, CHOLHDL, LDLDIRECT in the last 72 hours. Thyroid Function Tests: No results for input(s): TSH, T4TOTAL, FREET4, T3FREE, THYROIDAB in the last 72 hours. Anemia Panel: No results for input(s): VITAMINB12, FOLATE, FERRITIN, TIBC, IRON, RETICCTPCT in the last 72 hours. Sepsis Labs: Recent Labs  Lab 11/28/17 2201 11/29/17 0219 11/29/17 0537 11/29/17 0804  LATICACIDVEN 3.49* 3.1* 2.2* 1.6    Recent Results (from the past 240 hour(s))  Urine culture     Status: Abnormal (Preliminary result)   Collection Time: 11/28/17  8:46 PM  Result Value Ref Range Status   Specimen Description   Final    URINE, RANDOM Performed at [facility] [address] [place], [place] [address]    Special Requests   Final    NONE Performed at [facility] [place] [address] [place], [place] [address]    Culture 30,000 COLONIES/mL GRAM NEGATIVE RODS (A)  Final   Report Status PENDING  Incomplete         Radiology Studies: Ct Abdomen Pelvis Wo Contrast  Result Date: 11/29/2017 CLINICAL DATA:  Chronic left flank pain, worse since yesterday. Taking antibiotics for urinary tract infection. Fever. Clinical concern for abscess. EXAM: CT ABDOMEN AND PELVIS WITHOUT CONTRAST TECHNIQUE: Multidetector CT imaging of the abdomen and pelvis was performed following the standard protocol without IV contrast. COMPARISON:  Renal ultrasound dated 10/21/2017 and abdomen and pelvis CT dated 08/01/2017. FINDINGS:  Lower chest: Small left pleural effusion. Mild left basilar atelectasis and minimal right basilar atelectasis. Hepatobiliary: No focal liver abnormality is seen. No gallstones, gallbladder wall thickening, or biliary dilatation. Pancreas: Unremarkable. No pancreatic ductal dilatation or surrounding inflammatory changes. Spleen: Normal in size without focal abnormality. Adrenals/Urinary Tract: Normal appearing adrenal glands. Interval mild dilatation of the right renal collecting system and ureter to the level of the urinary bladder with no obstructing mass or calculus seen. Marked dilatation of the left renal collecting system and mild dilatation of the left ureter the level of the urinary bladder with no obstructing mass or calculus visualized. Mild asymmetrical left renal atrophy is stable. Left perinephric soft tissue stranding with improvement. Small amount of fluid and soft tissue stranding in the left pararenal space extending inferiorly into the upper pelvis. No fluid collections suspicious for abscess. Stomach/Bowel: Unremarkable stomach, small bowel and colon. No evidence of appendicitis. Vascular/Lymphatic: Atheromatous arterial calcifications without aneurysm. No enlarged lymph nodes. Reproductive: Status post hysterectomy. No adnexal masses. Other: Midline surgical scar. Musculoskeletal: Bilateral hip prostheses. Lumbar spine laminectomy defects with anterior posterior fixation hardware at the L2 through L4 levels and Ray cages at the L4-5 level. Extensive lumbar  and lower thoracic spine degenerative changes. IMPRESSION: 1. Interval mild right hydronephrosis and hydroureter with no obstructing stone or mass visualized. 2. Stable marked left hydronephrosis and mild left hydroureter with no obstructing stone or mass visualized. This remains compatible with a chronic partial left UPJ obstruction with associated mild left renal atrophy. 3. Mildly improved perinephric edema on the left with increased  pararenal edema and fluid extending into the upper left pelvis. 4. Interval small left pleural effusion. 5. No findings suspicious for abscess formation. Electronically Signed   By: Claudie Revering M.D.   On: 11/29/2017 12:30        Scheduled Meds: . amitriptyline  25 mg Oral QHS  . enoxaparin (LOVENOX) injection  40 mg Subcutaneous Daily  . gabapentin  300 mg Oral QHS   Continuous Infusions: . cefTRIAXone (ROCEPHIN)  IV       LOS: 2 days    Time spent: [place], MD [facility]  If 7PM-7AM, please contact night-coverage [email or web address] Password [facility] 11/30/2017, 10:33 AM

## 2017-12-01 LAB — URINE CULTURE: Culture: 30000 — AB

## 2017-12-01 MED ORDER — AMOXICILLIN-POT CLAVULANATE 875-125 MG PO TABS: 1.0000 | ORAL_TABLET | Freq: Two times a day (BID) | ORAL | 0 refills | Status: AC

## 2017-12-01 NOTE — Discharge Summary (Signed)
Physician Discharge Summary  [name]:[id number] DOB: June 26, 1945 DOA: 11/28/2017  PCP: [name], MD  Admit date: 11/28/2017 Discharge date: 12/01/2017  Admitted From: Home Disposition: Home  Recommendations for Outpatient Follow-up:  1. Follow up with PCP in 1-2 weeks 2. Please obtain BMP/CBC in one week 3. Please follow up with urology in 1 week for consideration of ureteral stent.  Home Health: No Equipment/Devices: None  Discharge Condition: Stable CODE STATUS: Full Diet recommendation: Regular  Brief/Interim Summary:  #) E. coli sepsis secondary to pyelonephritis in the setting of left-sided hydronephrosis: Patient was admitted with sepsis with fever, tachycardia, elevated white blood cell count.  Patient has a known history of severe left-sided hydronephrosis.  Blood cultures from admission were negative.  Patient was started on IV ceftriaxone and continued from 11/28/2017 to 12/01/2017.  Her urine cultures grew out approximately 30,000 colonies of E. coli that was resistant to ampicillin but susceptible to Augmentin.  Noncontrast CT on 11/29/2017 showed severe known left-sided hydronephrosis which was stable and mild new right-sided hydronephrosis.  Patient was discharged on a total of 2 weeks of Augmentin.  Urology was consulted for new slight right-sided hydronephrosis and known left-sided and recommended no urgent intervention at this time as patient was clinically improving however they will see her as an outpatient she is had recurrent episodes of pyelonephritis to consider placement of ureteral stent on the left.  #) Hypertension: Patient's lisinopril and HCTZ were held on admission.  These can be restarted on discharge.  #)  pain/psych: Patient was continued on home amitriptyline and gabapentin.  Discharge Diagnoses:  Active Problems:   Hypertension   Depression   History of hydronephrosis   Pyelonephritis    Discharge Instructions  Discharge  Instructions    Call MD for:  hives   Complete by:  As directed    Call MD for:  persistant nausea and vomiting   Complete by:  As directed    Call MD for:  redness, tenderness, or signs of infection (pain, swelling, redness, odor or green/yellow discharge around incision site)   Complete by:  As directed    Call MD for:  severe uncontrolled pain   Complete by:  As directed    Call MD for:  temperature >100.4   Complete by:  As directed    Diet - low sodium heart healthy   Complete by:  As directed    Discharge instructions   Complete by:  As directed    Please follow-up with your primary care doctor in 1 to 2 weeks.  Please take antibiotics as prescribed.   Increase activity slowly   Complete by:  As directed      Allergies as of 12/01/2017      Reactions   Cashew Nut Oil Hives   Dog Epithelium Hives, Itching   Ondansetron Hcl Nausea And Vomiting   "made me throw up"   Sulfa Antibiotics    "blistering lips and mouth sores"   Sulfamethoxazole    Mouth sores   Zofran Hives, Nausea Only      Medication List    STOP taking these medications   cefpodoxime 100 MG tablet Commonly known as:  VANTIN     TAKE these medications   amitriptyline 25 MG tablet Commonly known as:  ELAVIL Take 1 tablet (25 mg total) by mouth at bedtime.   amoxicillin-clavulanate 875-125 MG tablet Commonly known as:  AUGMENTIN Take 1 tablet by mouth 2 (two) times daily for 8 days.  EX-LAX PO Take 2-4 tablets by mouth daily as needed (CONSTIPATION).   gabapentin 300 MG capsule Commonly known as:  NEURONTIN Take 2 capsules (600 mg total) by mouth at bedtime. What changed:  how much to take   lisinopril-hydrochlorothiazide 10-12.5 MG tablet Commonly known as:  PRINZIDE,ZESTORETIC Take 1 tablet by mouth daily.   methocarbamol 500 MG tablet Commonly known as:  ROBAXIN Take 500 mg by mouth 4 (four) times daily.       Allergies  Allergen Reactions  . Cashew Nut Oil Hives  . Dog  Epithelium Hives and Itching  . Ondansetron Hcl Nausea And Vomiting    "made me throw up"  . Sulfa Antibiotics     "blistering lips and mouth sores"  . Sulfamethoxazole     Mouth sores  . Zofran Hives and Nausea Only    Consultations:  Urology   Procedures/Studies: Ct Abdomen Pelvis Wo Contrast  Result Date: 11/29/2017 CLINICAL DATA:  Chronic left flank pain, worse since yesterday. Taking antibiotics for urinary tract infection. Fever. Clinical concern for abscess. EXAM: CT ABDOMEN AND PELVIS WITHOUT CONTRAST TECHNIQUE: Multidetector CT imaging of the abdomen and pelvis was performed following the standard protocol without IV contrast. COMPARISON:  Renal ultrasound dated 10/21/2017 and abdomen and pelvis CT dated 08/01/2017. FINDINGS: Lower chest: Small left pleural effusion. Mild left basilar atelectasis and minimal right basilar atelectasis. Hepatobiliary: No focal liver abnormality is seen. No gallstones, gallbladder wall thickening, or biliary dilatation. Pancreas: Unremarkable. No pancreatic ductal dilatation or surrounding inflammatory changes. Spleen: Normal in size without focal abnormality. Adrenals/Urinary Tract: Normal appearing adrenal glands. Interval mild dilatation of the right renal collecting system and ureter to the level of the urinary bladder with no obstructing mass or calculus seen. Marked dilatation of the left renal collecting system and mild dilatation of the left ureter the level of the urinary bladder with no obstructing mass or calculus visualized. Mild asymmetrical left renal atrophy is stable. Left perinephric soft tissue stranding with improvement. Small amount of fluid and soft tissue stranding in the left pararenal space extending inferiorly into the upper pelvis. No fluid collections suspicious for abscess. Stomach/Bowel: Unremarkable stomach, small bowel and colon. No evidence of appendicitis. Vascular/Lymphatic: Atheromatous arterial calcifications without  aneurysm. No enlarged lymph nodes. Reproductive: Status post hysterectomy. No adnexal masses. Other: Midline surgical scar. Musculoskeletal: Bilateral hip prostheses. Lumbar spine laminectomy defects with anterior posterior fixation hardware at the L2 through L4 levels and Ray cages at the L4-5 level. Extensive lumbar and lower thoracic spine degenerative changes. IMPRESSION: 1. Interval mild right hydronephrosis and hydroureter with no obstructing stone or mass visualized. 2. Stable marked left hydronephrosis and mild left hydroureter with no obstructing stone or mass visualized. This remains compatible with a chronic partial left UPJ obstruction with associated mild left renal atrophy. 3. Mildly improved perinephric edema on the left with increased pararenal edema and fluid extending into the upper left pelvis. 4. Interval small left pleural effusion. 5. No findings suspicious for abscess formation. Electronically Signed   By: Claudie Revering M.D.   On: 11/29/2017 12:30     Subjective:   Discharge Exam: Vitals:   11/30/17 2104 12/01/17 0500  BP: (!) 141/63 127/63  Pulse: 60 (!) 50  Resp: 18 16  Temp: 98.5 F (36.9 C) 98.1 F (36.7 C)  SpO2: 97% 94%   Vitals:   11/30/17 0527 11/30/17 1245 11/30/17 2104 12/01/17 0500  BP: (!) 129/55 (!) 124/50 (!) 141/63 127/63  Pulse: 71 63 60 (!) 50  Resp: 18 18 18 16   Temp: 99.4 F (37.4 C) 97.8 F (36.6 C) 98.5 F (36.9 C) 98.1 F (36.7 C)  TempSrc: Oral Oral    SpO2: 96% 98% 97% 94%  Weight:      Height:        General exam: Appears calm and comfortable  Respiratory system: Clear to auscultation. Respiratory effort normal. Cardiovascular system: Regular rate and rhythm, no murmurs Gastrointestinal system: Soft, nondistended, no rebound or guarding, no flank pain, plus bowel sounds Central nervous system: Alert and oriented. No focal neurological deficits. Extremities: Trace lower extremity edema Skin: No rashes on visible skin Psychiatry:  Judgement and insight appear normal. Mood & affect appropriate.       The results of significant diagnostics from this hospitalization (including imaging, microbiology, ancillary and laboratory) are listed below for reference.     Microbiology: Recent Results (from the past 240 hour(s))  Urine culture     Status: Abnormal   Collection Time: 11/28/17  8:46 PM  Result Value Ref Range Status   Specimen Description   Final    URINE, RANDOM Performed at [facility] [address], [place], [place] [address]    Special Requests   Final    NONE Performed at [facility], [place] [address], [place], [place] [address]    Culture 30,000 COLONIES/mL ESCHERICHIA COLI (A)  Final   Report Status 12/01/2017 FINAL  Final   Organism ID, Bacteria ESCHERICHIA COLI (A)  Final      Susceptibility   Escherichia coli - MIC*    AMPICILLIN 16 INTERMEDIATE Intermediate     CEFAZOLIN <=4 SENSITIVE Sensitive     CEFTRIAXONE <=1 SENSITIVE Sensitive     CIPROFLOXACIN >=4 RESISTANT Resistant     GENTAMICIN <=1 SENSITIVE Sensitive     IMIPENEM <=0.25 SENSITIVE Sensitive     NITROFURANTOIN <=16 SENSITIVE Sensitive     TRIMETH/SULFA >=320 RESISTANT Resistant     AMPICILLIN/SULBACTAM 4 SENSITIVE Sensitive     PIP/TAZO <=4 SENSITIVE Sensitive     Extended ESBL NEGATIVE Sensitive     * 30,000 COLONIES/mL ESCHERICHIA COLI  Blood culture (routine x 2)     Status: None (Preliminary result)   Collection Time: 11/28/17 10:08 PM  Result Value Ref Range Status   Specimen Description   Final    BLOOD LEFT HAND Performed at [facility] [address], [place], [place] [address]    Special Requests   Final    BOTTLES DRAWN AEROBIC AND ANAEROBIC Blood Culture adequate volume Performed at [facility] [address], [place], [place] [address]    Culture   Final    NO GROWTH 2 DAYS Performed at [facility] [address]  [place], [place] [address]    Report Status PENDING  Incomplete     Labs: BNP (last 3 results) No results for input(s): BNP in the last 8760 hours. Basic Metabolic Panel: Recent Labs  Lab 11/28/17 2208 11/29/17 0219 11/30/17 0513  NA 137 137 141  K 5.3* 4.3 3.8  CL 102 107 110  CO2 24 19* 25  GLUCOSE 146* 195* 107*  BUN 9 9 8   CREATININE 1.04* 1.03* 0.77  CALCIUM 9.1 7.8* 8.3*  MG  --   --  2.0   Liver Function Tests: Recent Labs  Lab 11/28/17 2208  AST 39  ALT 13  ALKPHOS 54  BILITOT 1.4*  PROT 6.5  ALBUMIN 3.7  Recent Labs  Lab 11/28/17 2208  LIPASE 34   No results for input(s): AMMONIA in the last 168 hours. CBC: Recent Labs  Lab 11/28/17 2208 11/29/17 0219 11/30/17 0513  WBC 22.4* 20.0* 11.7*  HGB 14.7 12.8 11.8*  HCT 43.1 38.5 35.8*  MCV 93.9 96.5 95.5  PLT 349 274 259   Cardiac Enzymes: No results for input(s): CKTOTAL, CKMB, CKMBINDEX, TROPONINI in the last 168 hours. BNP: Invalid input(s): POCBNP CBG: No results for input(s): GLUCAP in the last 168 hours. D-Dimer No results for input(s): DDIMER in the last 72 hours. Hgb A1c No results for input(s): HGBA1C in the last 72 hours. Lipid Profile No results for input(s): CHOL, HDL, LDLCALC, TRIG, CHOLHDL, LDLDIRECT in the last 72 hours. Thyroid function studies No results for input(s): TSH, T4TOTAL, T3FREE, THYROIDAB in the last 72 hours.  Invalid input(s): FREET3 Anemia work up No results for input(s): VITAMINB12, FOLATE, FERRITIN, TIBC, IRON, RETICCTPCT in the last 72 hours. Urinalysis    Component Value Date/Time   COLORURINE AMBER (A) 11/28/2017 2029   APPEARANCEUR CLOUDY (A) 11/28/2017 2029   APPEARANCEUR Cloudy (A) 11/17/2017 1613   LABSPEC 1.016 11/28/2017 2029   PHURINE 6.0 11/28/2017 2029   GLUCOSEU NEGATIVE 11/28/2017 2029   HGBUR SMALL (A) 11/28/2017 2029   BILIRUBINUR NEGATIVE 11/28/2017 2029   BILIRUBINUR Negative 11/17/2017 [place] 11/28/2017 2029    PROTEINUR 100 (A) 11/28/2017 2029   UROBILINOGEN negative 09/30/2014 1049   UROBILINOGEN 0.2 07/07/2014 0537   NITRITE POSITIVE (A) 11/28/2017 2029   LEUKOCYTESUR MODERATE (A) 11/28/2017 2029   LEUKOCYTESUR 3+ (A) 11/17/2017 1613   Sepsis Labs Invalid input(s): PROCALCITONIN,  WBC,  LACTICIDVEN Microbiology Recent Results (from the past 240 hour(s))  Urine culture     Status: Abnormal   Collection Time: 11/28/17  8:46 PM  Result Value Ref Range Status   Specimen Description   Final    URINE, RANDOM Performed at [facility], [place] [address] [place], [place] [address]    Special Requests   Final    NONE Performed at [facility], [place] [address], [place], [place] [address]    Culture 30,000 COLONIES/mL ESCHERICHIA COLI (A)  Final   Report Status 12/01/2017 FINAL  Final   Organism ID, Bacteria ESCHERICHIA COLI (A)  Final      Susceptibility   Escherichia coli - MIC*    AMPICILLIN 16 INTERMEDIATE Intermediate     CEFAZOLIN <=4 SENSITIVE Sensitive     CEFTRIAXONE <=1 SENSITIVE Sensitive     CIPROFLOXACIN >=4 RESISTANT Resistant     GENTAMICIN <=1 SENSITIVE Sensitive     IMIPENEM <=0.25 SENSITIVE Sensitive     NITROFURANTOIN <=16 SENSITIVE Sensitive     TRIMETH/SULFA >=320 RESISTANT Resistant     AMPICILLIN/SULBACTAM 4 SENSITIVE Sensitive     PIP/TAZO <=4 SENSITIVE Sensitive     Extended ESBL NEGATIVE Sensitive     * 30,000 COLONIES/mL ESCHERICHIA COLI  Blood culture (routine x 2)     Status: None (Preliminary result)   Collection Time: 11/28/17 10:08 PM  Result Value Ref Range Status   Specimen Description   Final    BLOOD LEFT HAND Performed at [facility] [address] [place], [place] [address]    Special Requests   Final    BOTTLES DRAWN AEROBIC AND ANAEROBIC Blood Culture adequate volume Performed at [facility] [address], [place], [place] [address]    Culture  Final    NO GROWTH  2 DAYS Performed at [facility], [place] [address] [place], [place] [address]    Report Status PENDING  Incomplete     Time coordinating discharge: Over 30 minutes  SIGNED:   Cristy Folks, MD  [facility] 12/01/2017, 9:03 AM  If 7PM-7AM, please contact night-coverage [email or web address] Password [email or web address]

## 2017-12-01 NOTE — Care Management Important Message (Signed)
Important Message  Patient Details  Name: BABS DABBS MRN: [id number] Date of Birth: May 23, 1945   Medicare Important Message Given:  Yes    Kerin Salen 12/01/2017, 10:50 AMImportant Message  Patient Details  Name: MELONIE GERMANI MRN: [id number] Date of Birth: 1946-01-27   Medicare Important Message Given:  Yes    Kerin Salen 12/01/2017, 10:50 AM

## 2017-12-04 ENCOUNTER — Other Ambulatory Visit: Payer: Self-pay | Admitting: Urology

## 2017-12-04 LAB — CULTURE, BLOOD (ROUTINE X 2)
Culture: NO GROWTH
Special Requests: ADEQUATE

## 2017-12-05 ENCOUNTER — Encounter (HOSPITAL_COMMUNITY): Payer: Self-pay | Admitting: *Deleted

## 2017-12-05 ENCOUNTER — Other Ambulatory Visit: Payer: Self-pay

## 2017-12-07 ENCOUNTER — Encounter (HOSPITAL_COMMUNITY)
Admission: RE | Disposition: A | Discharge status: Home / Self Care | Payer: Self-pay | Source: Ambulatory Visit | Attending: Urology

## 2017-12-07 ENCOUNTER — Encounter (HOSPITAL_COMMUNITY): Payer: Self-pay

## 2017-12-07 ENCOUNTER — Ambulatory Visit (HOSPITAL_COMMUNITY)
Admission: RE | Admit: 2017-12-07 | Discharge: 2017-12-07 | Disposition: A | Discharge status: Home / Self Care | Payer: Medicare HMO | Source: Ambulatory Visit | Attending: Urology | Admitting: Urology

## 2017-12-07 ENCOUNTER — Ambulatory Visit (HOSPITAL_COMMUNITY): Payer: Medicare HMO | Admitting: Anesthesiology

## 2017-12-07 ENCOUNTER — Ambulatory Visit (HOSPITAL_COMMUNITY): Payer: Medicare HMO

## 2017-12-07 DIAGNOSIS — N1330 Unspecified hydronephrosis: Secondary | ICD-10-CM | POA: Insufficient documentation

## 2017-12-07 DIAGNOSIS — Z79899 Other long term (current) drug therapy: Secondary | ICD-10-CM | POA: Insufficient documentation

## 2017-12-07 DIAGNOSIS — Z86010 Personal history of colonic polyps: Secondary | ICD-10-CM | POA: Insufficient documentation

## 2017-12-07 DIAGNOSIS — Z882 Allergy status to sulfonamides status: Secondary | ICD-10-CM | POA: Diagnosis not present

## 2017-12-07 DIAGNOSIS — E785 Hyperlipidemia, unspecified: Secondary | ICD-10-CM | POA: Diagnosis not present

## 2017-12-07 DIAGNOSIS — Z91018 Allergy to other foods: Secondary | ICD-10-CM | POA: Insufficient documentation

## 2017-12-07 DIAGNOSIS — Z9842 Cataract extraction status, left eye: Secondary | ICD-10-CM | POA: Diagnosis not present

## 2017-12-07 DIAGNOSIS — Z8542 Personal history of malignant neoplasm of other parts of uterus: Secondary | ICD-10-CM | POA: Diagnosis not present

## 2017-12-07 DIAGNOSIS — I10 Essential (primary) hypertension: Secondary | ICD-10-CM | POA: Insufficient documentation

## 2017-12-07 DIAGNOSIS — Z888 Allergy status to other drugs, medicaments and biological substances status: Secondary | ICD-10-CM | POA: Diagnosis not present

## 2017-12-07 DIAGNOSIS — Z833 Family history of diabetes mellitus: Secondary | ICD-10-CM | POA: Insufficient documentation

## 2017-12-07 DIAGNOSIS — Z90710 Acquired absence of both cervix and uterus: Secondary | ICD-10-CM | POA: Diagnosis not present

## 2017-12-07 DIAGNOSIS — Z9841 Cataract extraction status, right eye: Secondary | ICD-10-CM | POA: Diagnosis not present

## 2017-12-07 DIAGNOSIS — Z6834 Body mass index (BMI) 34.0-34.9, adult: Secondary | ICD-10-CM | POA: Insufficient documentation

## 2017-12-07 DIAGNOSIS — E669 Obesity, unspecified: Secondary | ICD-10-CM | POA: Diagnosis not present

## 2017-12-07 DIAGNOSIS — Z801 Family history of malignant neoplasm of trachea, bronchus and lung: Secondary | ICD-10-CM | POA: Diagnosis not present

## 2017-12-07 DIAGNOSIS — Z96653 Presence of artificial knee joint, bilateral: Secondary | ICD-10-CM | POA: Insufficient documentation

## 2017-12-07 DIAGNOSIS — Z87440 Personal history of urinary (tract) infections: Secondary | ICD-10-CM | POA: Insufficient documentation

## 2017-12-07 DIAGNOSIS — Z9109 Other allergy status, other than to drugs and biological substances: Secondary | ICD-10-CM | POA: Insufficient documentation

## 2017-12-07 DIAGNOSIS — D649 Anemia, unspecified: Secondary | ICD-10-CM | POA: Diagnosis not present

## 2017-12-07 DIAGNOSIS — N135 Crossing vessel and stricture of ureter without hydronephrosis: Secondary | ICD-10-CM | POA: Diagnosis not present

## 2017-12-07 DIAGNOSIS — M1990 Unspecified osteoarthritis, unspecified site: Secondary | ICD-10-CM | POA: Diagnosis not present

## 2017-12-07 DIAGNOSIS — Z800 Family history of malignant neoplasm of digestive organs: Secondary | ICD-10-CM | POA: Diagnosis not present

## 2017-12-07 DIAGNOSIS — G894 Chronic pain syndrome: Secondary | ICD-10-CM | POA: Diagnosis not present

## 2017-12-07 HISTORY — PX: CYSTOSCOPY W/ URETERAL STENT PLACEMENT: SHX1429

## 2017-12-07 SURGERY — CYSTOSCOPY, WITH RETROGRADE PYELOGRAM AND URETERAL STENT INSERTION
Anesthesia: General | Laterality: Left

## 2017-12-07 MED ORDER — FENTANYL CITRATE (PF) 100 MCG/2ML IJ SOLN
INTRAMUSCULAR | Status: DC | PRN
  Administered 2017-12-07 (×2): 50 ug via INTRAVENOUS

## 2017-12-07 MED ORDER — DIPHENHYDRAMINE HCL 50 MG/ML IJ SOLN
INTRAMUSCULAR | Status: AC
  Filled 2017-12-07: qty 1

## 2017-12-07 MED ORDER — DEXAMETHASONE SODIUM PHOSPHATE 10 MG/ML IJ SOLN
INTRAMUSCULAR | Status: DC | PRN
  Administered 2017-12-07: 10 mg via INTRAVENOUS

## 2017-12-07 MED ORDER — LACTATED RINGERS IV SOLN
INTRAVENOUS | Status: DC
  Administered 2017-12-07: 1000 mL via INTRAVENOUS

## 2017-12-07 MED ORDER — PROPOFOL 10 MG/ML IV BOLUS
INTRAVENOUS | Status: AC
Start: 2017-12-07 — End: ?
  Filled 2017-12-07: qty 20

## 2017-12-07 MED ORDER — LIDOCAINE 2% (20 MG/ML) 5 ML SYRINGE
INTRAMUSCULAR | Status: AC
  Filled 2017-12-07: qty 5

## 2017-12-07 MED ORDER — SODIUM CHLORIDE 0.9 % IV SOLN
2.0000 g | Freq: Once | INTRAVENOUS | Status: AC
  Administered 2017-12-07: 2 g via INTRAVENOUS
  Filled 2017-12-07: qty 20

## 2017-12-07 MED ORDER — DIPHENHYDRAMINE HCL 50 MG/ML IJ SOLN
INTRAMUSCULAR | Status: DC | PRN
  Administered 2017-12-07: 12.5 mg via INTRAVENOUS

## 2017-12-07 MED ORDER — LIDOCAINE 2% (20 MG/ML) 5 ML SYRINGE
INTRAMUSCULAR | Status: DC | PRN
  Administered 2017-12-07: 60 mg via INTRAVENOUS

## 2017-12-07 MED ORDER — FENTANYL CITRATE (PF) 100 MCG/2ML IJ SOLN: 25.0000 ug | INTRAMUSCULAR | Status: DC | PRN

## 2017-12-07 MED ORDER — NITROFURANTOIN MACROCRYSTAL 50 MG PO CAPS: 50.0000 mg | ORAL_CAPSULE | Freq: Every day | ORAL | 11 refills | Status: DC

## 2017-12-07 MED ORDER — DEXAMETHASONE SODIUM PHOSPHATE 10 MG/ML IJ SOLN
INTRAMUSCULAR | Status: AC
  Filled 2017-12-07: qty 1

## 2017-12-07 MED ORDER — PROMETHAZINE HCL 25 MG/ML IJ SOLN: 6.2500 mg | INTRAMUSCULAR | Status: DC | PRN

## 2017-12-07 MED ORDER — PROPOFOL 10 MG/ML IV BOLUS
INTRAVENOUS | Status: DC | PRN
  Administered 2017-12-07: 150 mg via INTRAVENOUS

## 2017-12-07 MED ORDER — FENTANYL CITRATE (PF) 100 MCG/2ML IJ SOLN
INTRAMUSCULAR | Status: AC
  Filled 2017-12-07: qty 2

## 2017-12-07 SURGICAL SUPPLY — 12 items
BAG URO CATCHER STRL LF (MISCELLANEOUS) ×2 IMPLANT
CATH INTERMIT  6FR 70CM (CATHETERS) ×2 IMPLANT
CLOTH BEACON ORANGE TIMEOUT ST (SAFETY) ×2 IMPLANT
COVER FOOTSWITCH UNIV (MISCELLANEOUS) IMPLANT
COVER SURGICAL LIGHT HANDLE (MISCELLANEOUS) ×2 IMPLANT
GLOVE BIOGEL M STRL SZ7.5 (GLOVE) ×2 IMPLANT
GOWN STRL REUS W/TWL LRG LVL3 (GOWN DISPOSABLE) ×4 IMPLANT
GUIDEWIRE STR DUAL SENSOR (WIRE) ×2 IMPLANT
MANIFOLD NEPTUNE II (INSTRUMENTS) ×2 IMPLANT
PACK CYSTO (CUSTOM PROCEDURE TRAY) ×2 IMPLANT
STENT URET 6FRX26 CONTOUR (STENTS) ×1 IMPLANT
TUBING CONNECTING 10 (TUBING) ×2 IMPLANT

## 2017-12-07 NOTE — Op Note (Signed)
Preoperative diagnosis:  1. Left hydronephrosis   Postoperative diagnosis:  1. Left hydronephrosis   Procedure:  1. Cystoscopy 2. Left ureteral stent placement (6 x 26) no string 3. Left retrograde pyelography with interpretation  Surgeon: Pryor Curia. M.D.  Anesthesia: General  Complications: None  Intraoperative findings: Left retrograde pyelography was performed with a 6 Fr ureteral catheter and and omnipaque contrast.  This demonstrated a normal caliber and normal appearing ureter up to the proximal ureter where the ureter was deviated medially and was slightly narrowed.  The renal collecting system was dilated but without filling defects.  EBL: Minimal  Specimens: None  Indication: [name] is a 72 y.o. patient with recurrent left pyelonephritis and partial left ureteral obstruction. After reviewing the management options for treatment, he elected to proceed with the above surgical procedure(s). We have discussed the potential benefits and risks of the procedure, side effects of the proposed treatment, the likelihood of the patient achieving the goals of the procedure, and any potential problems that might occur during the procedure or recuperation. Informed consent has been obtained.  Description of procedure:  The patient was taken to the operating room and general anesthesia was induced.  The patient was placed in the dorsal lithotomy position, prepped and draped in the usual sterile fashion, and preoperative antibiotics were administered. A preoperative time-out was performed.   Cystourethroscopy was performed.  The patient's urethra was examined and was normal. The bladder was then systematically examined in its entirety. There was no evidence for any bladder tumors, stones, or other mucosal pathology.    Attention then turned to the left ureteral orifice and a ureteral catheter was used to intubate the ureteral orifice.  Omnipaque contrast was injected  through the ureteral catheter and a retrograde pyelogram was performed with findings as dictated above.  A 0.38 sensor guidewire was then advanced up the left ureter into the renal pelvis under fluoroscopic guidance.  The wire was then backloaded through the cystoscope and a ureteral stent was advance over the wire using Seldinger technique.  The stent was positioned appropriately under fluoroscopic and cystoscopic guidance.  The wire was then removed with an adequate stent curl noted in the renal pelvis as well as in the bladder.  The bladder was then emptied and the procedure ended.  The patient appeared to tolerate the procedure well and without complications.  The patient was able to be awakened and transferred to the recovery unit in satisfactory condition.    Pryor Curia MD

## 2017-12-07 NOTE — Anesthesia Preprocedure Evaluation (Addendum)
Anesthesia Evaluation  Patient identified by MRN, date of birth, ID band Patient awake    Reviewed: Allergy & Precautions, NPO status , Patient's Chart, lab work & pertinent test results  Airway Mallampati: II  TM Distance: >3 FB Neck ROM: Full    Dental  (+) Dental Advisory Given   Pulmonary neg pulmonary ROS,    breath sounds clear to auscultation       Cardiovascular hypertension, Pt. on medications  Rhythm:Regular Rate:Normal     Neuro/Psych    GI/Hepatic negative GI ROS, Neg liver ROS,   Endo/Other  negative endocrine ROS  Renal/GU Renal disease     Musculoskeletal  (+) Arthritis ,   Abdominal   Peds  Hematology  (+) anemia ,   Anesthesia Other Findings   Reproductive/Obstetrics                            Lab Results  Component Value Date   WBC 11.7 (H) 11/30/2017   HGB 11.8 (L) 11/30/2017   HCT 35.8 (L) 11/30/2017   MCV 95.5 11/30/2017   PLT 259 11/30/2017   Lab Results  Component Value Date   CREATININE 0.77 11/30/2017   BUN 8 11/30/2017   NA 141 11/30/2017   K 3.8 11/30/2017   CL 110 11/30/2017   CO2 25 11/30/2017    Anesthesia Physical Anesthesia Plan  ASA: II  Anesthesia Plan: General   Post-op Pain Management:    Induction: Intravenous  PONV Risk Score and Plan: 3 and Dexamethasone, Ondansetron and Treatment may vary due to age or medical condition  Airway Management Planned: LMA  Additional Equipment:   Intra-op Plan:   Post-operative Plan: Extubation in OR  Informed Consent: I have reviewed the patients History and Physical, chart, labs and discussed the procedure including the risks, benefits and alternatives for the proposed anesthesia with the patient or authorized representative who has indicated his/her understanding and acceptance.   Dental advisory given  Plan Discussed with: CRNA  Anesthesia Plan Comments:         Anesthesia Quick  Evaluation

## 2017-12-07 NOTE — Anesthesia Procedure Notes (Addendum)
Procedure Name: LMA Insertion Date/Time: 12/07/2017 12:08 PM Performed by: Mitzie Na, CRNA Pre-anesthesia Checklist: Patient identified, Emergency Drugs available, Suction available, Patient being monitored and Timeout performed Patient Re-evaluated:Patient Re-evaluated prior to induction Oxygen Delivery Method: Circle system utilized Preoxygenation: Pre-oxygenation with 100% oxygen Induction Type: IV induction LMA: LMA inserted LMA Size: 3.0 Number of attempts: 1 Placement Confirmation: positive ETCO2 and breath sounds checked- equal and bilateral Dental Injury: Teeth and Oropharynx as per pre-operative assessment

## 2017-12-07 NOTE — OR Nursing (Signed)
Patient ambulatory to restroom with daughter. Patient had steady gait with the use of her cane. Patient had a urine occurrence, NAD at this time.

## 2017-12-07 NOTE — Discharge Instructions (Signed)

## 2017-12-07 NOTE — Interval H&P Note (Signed)
History and Physical Interval Note:  12/07/2017 11:45 AM  [name]  has presented today for surgery, with the diagnosis of LEFT HYDRONEPHROSIS  The various methods of treatment have been discussed with the patient and family. After consideration of risks, benefits and other options for treatment, the patient has consented to  Procedure(s): CYSTOSCOPY WITH RETROGRADE PYELOGRAM/URETERAL STENT PLACEMENT (Left) as a surgical intervention .  The patient's history has been reviewed, patient examined, no change in status, stable for surgery.  I have reviewed the patient's chart and labs.  Questions were answered to the patient's satisfaction.     Yosiah Jasmin,LES

## 2017-12-07 NOTE — Progress Notes (Signed)
Pt received from OR with purple eyeglasses and denture box.

## 2017-12-07 NOTE — Progress Notes (Signed)
Pt placed dentures orally and is wearing eyeglasses

## 2017-12-07 NOTE — Transfer of Care (Signed)
Immediate Anesthesia Transfer of Care Note  Patient: [name]  Procedure(s) Performed: CYSTOSCOPY WITH RETROGRADE PYELOGRAM/URETERAL STENT PLACEMENT (Left )  Patient Location: PACU  Anesthesia Type:General  Level of Consciousness: awake, alert , oriented and patient cooperative  Airway & Oxygen Therapy: Patient Spontanous Breathing and Patient connected to face mask oxygen  Post-op Assessment: Report given to RN, Post -op Vital signs reviewed and stable and Patient moving all extremities  Post vital signs: Reviewed and stable  Last Vitals:  Vitals Value Taken Time  BP 145/70 12/07/2017 12:36 PM  Temp    Pulse 50 12/07/2017 12:38 PM  Resp 11 12/07/2017 12:38 PM  SpO2 100 % 12/07/2017 12:38 PM  Vitals shown include unvalidated device data.  Last Pain:  Vitals:   12/07/17 0848  TempSrc:   PainSc: 0-No pain      Patients Stated Pain Goal: 3 (97/53/00 5110)  Complications: No apparent anesthesia complications

## 2017-12-08 ENCOUNTER — Telehealth: Payer: Self-pay | Admitting: Pediatrics

## 2017-12-08 ENCOUNTER — Encounter (HOSPITAL_COMMUNITY): Payer: Self-pay | Admitting: Urology

## 2017-12-08 NOTE — Telephone Encounter (Signed)
lmtcb with name of medication

## 2017-12-08 NOTE — Anesthesia Postprocedure Evaluation (Signed)
Anesthesia Post Note  Patient: [name]  Procedure(s) Performed: CYSTOSCOPY WITH RETROGRADE PYELOGRAM/URETERAL STENT PLACEMENT (Left )     Patient location during evaluation: PACU Anesthesia Type: General Level of consciousness: awake and alert Pain management: pain level controlled Vital Signs Assessment: post-procedure vital signs reviewed and stable Respiratory status: spontaneous breathing, nonlabored ventilation, respiratory function stable and patient connected to nasal cannula oxygen Cardiovascular status: blood pressure returned to baseline and stable Postop Assessment: no apparent nausea or vomiting Anesthetic complications: no    Last Vitals:  Vitals:   12/07/17 1312 12/07/17 1350  BP: (!) 165/69 (!) 159/85  Pulse: (!) 48 60  Resp: 14 15  Temp: (!) 36.3 C (!) 36.3 C  SpO2: 100% 100%    Last Pain:  Vitals:   12/07/17 1350  TempSrc:   PainSc: 0-No pain                 Tiajuana Amass

## 2017-12-09 MED ORDER — LISINOPRIL 10 MG PO TABS: 10.0000 mg | ORAL_TABLET | Freq: Every day | ORAL | 0 refills | Status: DC

## 2017-12-09 NOTE — Telephone Encounter (Signed)
Sent in lisinopril alone. Continue check blood pressures at home until her visit with me for hospital follow-up.  Bring numbers from home with her to that office visit.

## 2017-12-11 NOTE — Telephone Encounter (Signed)
Patient aware.

## 2017-12-15 ENCOUNTER — Ambulatory Visit (INDEPENDENT_AMBULATORY_CARE_PROVIDER_SITE_OTHER): Payer: Medicare HMO | Admitting: Pediatrics

## 2017-12-15 ENCOUNTER — Encounter: Payer: Self-pay | Admitting: Pediatrics

## 2017-12-15 VITALS — BP 128/74 | HR 68 | Temp 97.7°F | Ht 60.0 in | Wt 177.0 lb

## 2017-12-15 DIAGNOSIS — I10 Essential (primary) hypertension: Secondary | ICD-10-CM

## 2017-12-15 DIAGNOSIS — Z09 Encounter for follow-up examination after completed treatment for conditions other than malignant neoplasm: Secondary | ICD-10-CM

## 2017-12-15 DIAGNOSIS — B372 Candidiasis of skin and nail: Secondary | ICD-10-CM | POA: Diagnosis not present

## 2017-12-15 DIAGNOSIS — G8929 Other chronic pain: Secondary | ICD-10-CM | POA: Diagnosis not present

## 2017-12-15 MED ORDER — NYSTATIN 100000 UNIT/GM EX OINT: 1.0000 "application " | TOPICAL_OINTMENT | Freq: Two times a day (BID) | CUTANEOUS | 1 refills | Status: DC

## 2017-12-15 MED ORDER — GABAPENTIN 300 MG PO CAPS: 300.0000 mg | ORAL_CAPSULE | Freq: Two times a day (BID) | ORAL | 1 refills | Status: DC

## 2017-12-15 NOTE — Progress Notes (Signed)
  Subjective:   Patient ID: [name], female    DOB: 1945-11-16, 72 y.o.   MRN: [id number] CC: Hospitalization Follow-up  HPI: [name] is a 72 y.o. female   Recently admitted for E. coli sepsis secondary to pyelonephritis.  She was admitted 7/23.  Discharge 7/26.  She had a ureteral stent placed on 8/1 with urology to treat hydronephrosis given recurrent urinary tract infections.  She is now taking nitrofurantoin for prophylaxis once a day.  She has been feeling well for the last week.  Immediately after ureteral stent placement she had more frequent urination.  Now she feels back to her normal self.  No dysuria.  No urgency or frequency.  Normal appetite.  No abdominal pain.  No fevers.  Hypertension: Taking lisinopril once a day.  No lightheadedness or dizziness.  No chest pressure.  Chronic pain in back: Following with Dr. Assunta Curtis.  Has an upcoming MRI.  Candidal intertrigo: Bothered by a rash under her breasts.  Nystatin is helped in the past.  Relevant past medical, surgical, family and social history reviewed. Allergies and medications reviewed and updated. Social History   Tobacco Use  Smoking Status Never Smoker  Smokeless Tobacco Never Used   ROS: Per HPI   Objective:    BP 128/74   Pulse 68   Temp 97.7 F (36.5 C) (Oral)   Ht 5' (1.524 m)   Wt 177 lb (80.3 kg)   BMI 34.57 kg/m   Wt Readings from Last 3 Encounters:  12/15/17 177 lb (80.3 kg)  12/07/17 177 lb (80.3 kg)  11/28/17 178 lb (80.7 kg)    Gen: NAD, alert, cooperative with exam, NCAT EYES: EOMI, no conjunctival injection, or no icterus ENT:  TMs pearly gray b/l, small layering white right sided effusion, OP without erythema LYMPH: no cervical LAD CV: NRRR, normal S1/S2, no murmur, distal pulses 2+ b/l Resp: CTABL, no wheezes, normal WOB Abd: +BS, soft, NTND. no guarding or organomegaly Ext: No edema, warm Neuro: Alert and oriented  Assessment & Plan:  Loistine was seen today for  hospitalization follow-up.  Diagnoses and all orders for this visit:  Candidal intertrigo Start below. -     nystatin ointment (MYCOSTATIN); Apply 1 application topically 2 (two) times daily.  Other chronic pain Following with specialist.  Continue gabapentin. -     gabapentin (NEURONTIN) 300 MG capsule; Take 1 capsule (300 mg total) by mouth 2 (two) times daily.  Hospital discharge follow-up Symptoms much improved, hopefully stent will prevent recurrent UTIs. -     CBC with Differential -     Basic Metabolic Panel  Essential hypertension Stable off of hydrochlorthiazide.  Rechecking blood work as above.  Continue to check intermittently at home, let me know if regularly elevated.  Follow up plan: Return in about 3 months (around 03/17/2018). [name], MD [place]

## 2017-12-16 LAB — BASIC METABOLIC PANEL
BUN / CREAT RATIO: 11 — AB (ref 12–28)
BUN: 9 mg/dL (ref 8–27)
CO2: 21 mmol/L (ref 20–29)
CREATININE: 0.83 mg/dL (ref 0.57–1.00)
Calcium: 9.7 mg/dL (ref 8.7–10.3)
Chloride: 99 mmol/L (ref 96–106)
GFR calc Af Amer: 81 mL/min/{1.73_m2} (ref >59)
GFR, EST NON AFRICAN AMERICAN: 70 mL/min/{1.73_m2} (ref >59)
Glucose: 83 mg/dL (ref 65–99)
Potassium: 3.9 mmol/L (ref 3.5–5.2)
Sodium: 139 mmol/L (ref 134–144)

## 2017-12-16 LAB — CBC WITH DIFFERENTIAL/PLATELET
BASOS: 0 %
Basophils Absolute: 0 10*3/uL (ref 0.0–0.2)
EOS (ABSOLUTE): 0.2 10*3/uL (ref 0.0–0.4)
EOS: 3 %
HEMATOCRIT: 40.3 % (ref 34.0–46.6)
HEMOGLOBIN: 13.5 g/dL (ref 11.1–15.9)
Immature Grans (Abs): 0 10*3/uL (ref 0.0–0.1)
Immature Granulocytes: 0 %
LYMPHS ABS: 3.1 10*3/uL (ref 0.7–3.1)
Lymphs: 39 %
MCH: 31.5 pg (ref 26.6–33.0)
MCHC: 33.5 g/dL (ref 31.5–35.7)
MCV: 94 fL (ref 79–97)
Monocytes Absolute: 0.6 10*3/uL (ref 0.1–0.9)
Monocytes: 8 %
NEUTROS ABS: 4 10*3/uL (ref 1.4–7.0)
Neutrophils: 50 %
Platelets: 356 10*3/uL (ref 150–450)
RBC: 4.29 x10E6/uL (ref 3.77–5.28)
RDW: 13.6 % (ref 12.3–15.4)
WBC: 8 10*3/uL (ref 3.4–10.8)

## 2017-12-18 ENCOUNTER — Ambulatory Visit: Payer: Medicare HMO

## 2018-01-02 DIAGNOSIS — M79673 Pain in unspecified foot: Secondary | ICD-10-CM | POA: Diagnosis not present

## 2018-01-02 DIAGNOSIS — G609 Hereditary and idiopathic neuropathy, unspecified: Secondary | ICD-10-CM | POA: Diagnosis not present

## 2018-01-02 DIAGNOSIS — L84 Corns and callosities: Secondary | ICD-10-CM | POA: Diagnosis not present

## 2018-01-03 DIAGNOSIS — M961 Postlaminectomy syndrome, not elsewhere classified: Secondary | ICD-10-CM | POA: Diagnosis not present

## 2018-01-03 DIAGNOSIS — M48061 Spinal stenosis, lumbar region without neurogenic claudication: Secondary | ICD-10-CM | POA: Diagnosis not present

## 2018-01-09 ENCOUNTER — Encounter: Payer: Self-pay | Admitting: Family Medicine

## 2018-01-09 ENCOUNTER — Ambulatory Visit (INDEPENDENT_AMBULATORY_CARE_PROVIDER_SITE_OTHER): Payer: Medicare HMO | Admitting: Family Medicine

## 2018-01-09 VITALS — BP 138/75 | HR 75 | Temp 97.2°F | Ht 60.0 in | Wt 180.0 lb

## 2018-01-09 DIAGNOSIS — J069 Acute upper respiratory infection, unspecified: Secondary | ICD-10-CM | POA: Diagnosis not present

## 2018-01-09 MED ORDER — BENZONATATE 100 MG PO CAPS: 100.0000 mg | ORAL_CAPSULE | Freq: Three times a day (TID) | ORAL | 0 refills | Status: DC | PRN

## 2018-01-09 NOTE — Progress Notes (Signed)
Subjective: CC: cough/ congestion PCP: [name], MD PPI:RJJOACZ L Mirabella is a 72 y.o. female presenting to clinic today for:  1.Cold symptoms  Patient reports productive cough, chest and sinus congestion that started Sunday morning.  She reports associated night sweat.  Denies hemoptysis, dizziness, rash, nausea, vomiting, diarrhea, fevers, myalgia, sick contacts, recent travel.  Patient has used nothing for symptoms.  Denies history of COPD or asthma.  Denies tobacco use/ exposure.    ROS: Per HPI  Allergies  Allergen Reactions  . Cashew Nut Oil Hives  . Dog Epithelium Hives and Itching  . Ondansetron Hcl Nausea And Vomiting    "made me throw up"  . Sulfa Antibiotics     "blistering lips and mouth sores"  . Sulfamethoxazole     Mouth sores  . Zofran Hives and Nausea Only   Past Medical History:  Diagnosis Date  . Arthritis   . Colon polyp, hyperplastic 04/27/2012   Colonoscopy 2010.  No adenomatous polyps   . Endometrioid adenocarcinoma   . History of endometrial cancer s/p TAH BSO June2013 06/15/2011   Grade 1 endometrioid adenocarcinoma with focal superficial myometrial invasion of 0.1 cm with a myometrium of 1.8 cm or approximately 7%. She had a 5-cm tumor. No lymphovascular space Involvement.    . Hyperlipidemia   . Hypertension   . Mass of in fold of jejunum s/p SB resection 07/19/2012 07/20/2012  . Obesity (BMI 30-39.9) 04/27/2012  . Pyelonephritis 02/2016  . 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 SBO (small bowel obstruction) - recurrent 04/27/2012  . Ureteral adhesion, left 2010   s/p ureterolysis & stenting Dr. Larwance Sachs now  . UTI (urinary tract infection)     Current Outpatient Medications:  .  amitriptyline (ELAVIL) 25 MG tablet, Take 1 tablet (25 mg total) by mouth at bedtime., Disp: 90 tablet, Rfl: 2 .  gabapentin (NEURONTIN) 300 MG capsule, Take 1 capsule (300 mg total) by mouth 2 (two)  times daily., Disp: 180 capsule, Rfl: 1 .  lisinopril (PRINIVIL,ZESTRIL) 10 MG tablet, Take 1 tablet (10 mg total) by mouth daily., Disp: 90 tablet, Rfl: 0 .  methocarbamol (ROBAXIN) 500 MG tablet, Take 500 mg by mouth 4 (four) times daily., Disp: , Rfl:  .  nitrofurantoin (MACRODANTIN) 50 MG capsule, Take 1 capsule (50 mg total) by mouth at bedtime. Begin after completing current course of antibiotics., Disp: 30 capsule, Rfl: 11 .  nystatin ointment (MYCOSTATIN), Apply 1 application topically 2 (two) times daily., Disp: 30 g, Rfl: 1 .  oxyCODONE-acetaminophen (PERCOCET/ROXICET) 5-325 MG tablet, Take 5 tablets by mouth every 6 (six) hours., Disp: , Rfl:  .  Sennosides (EX-LAX PO), Take 2-4 tablets by mouth daily as needed (CONSTIPATION)., Disp: , Rfl:  Social History   Socioeconomic History  . Marital status: Widowed    Spouse name: Not on file  . Number of children: Not on file  . Years of education: Not on file  . Highest education level: Not on file  Occupational History  . Not on file  Social Needs  . Financial resource strain: Not on file  . Food insecurity:    Worry: Not on file    Inability: Not on file  . Transportation needs:    Medical: Not on file    Non-medical: Not on file  Tobacco Use  . Smoking status: Never Smoker  . Smokeless tobacco: Never Used  Substance and Sexual Activity  . Alcohol use:  No  . Drug use: No  . Sexual activity: Never  Lifestyle  . Physical activity:    Days per week: Not on file    Minutes per session: Not on file  . Stress: Not on file  Relationships  . Social connections:    Talks on phone: Not on file    Gets together: Not on file    Attends religious service: Not on file    Active member of club or organization: Not on file    Attends meetings of clubs or organizations: Not on file    Relationship status: Not on file  . Intimate partner violence:    Fear of current or ex partner: Not on file    Emotionally abused: Not on file     Physically abused: Not on file    Forced sexual activity: Not on file  Other Topics Concern  . Not on file  Social History Narrative  . Not on file   Family History  Problem Relation Age of Onset  . Diabetes Mother   . Cancer Mother        lung  . Diabetes Father   . Cancer Father        liver  . Colon cancer Sister   . Diabetes Sister   . Cancer Sister        colon  . Breast cancer Neg Hx   . Allergic rhinitis Neg Hx   . Angioedema Neg Hx   . Asthma Neg Hx   . Atopy Neg Hx   . Eczema Neg Hx   . Immunodeficiency Neg Hx   . Urticaria Neg Hx     Objective: Office vital signs reviewed. BP 138/75   Pulse 75   Temp (!) 97.2 F (36.2 C) (Oral)   Ht 5' (1.524 m)   Wt 180 lb (81.6 kg)   SpO2 95%   BMI 35.15 kg/m   Physical Examination:  General: Awake, alert, well nourished, nontoxic. No acute distress HEENT: Normal; no sinus tenderness to palpation    Neck: No masses palpated. No lymphadenopathy    Ears: Tympanic membranes intact, dulled light reflex, no erythema, no bulging    Eyes: PERRLA, extraocular membranes intact, sclera white    Nose: nasal turbinates moist, clear nasal discharge    Throat: moist mucus membranes, no erythema, no tonsillar exudate.  Airway is patent Cardio: regular rate and rhythm, S1S2 heard, no murmurs appreciated Pulm: clear to auscultation bilaterally, no wheezes, rhonchi or rales; normal work of breathing on room air   Assessment/ Plan: 72 y.o. female   1. URI with cough and congestion Patient is afebrile nontoxic-appearing.  Her vital signs are stable.  Pulse ox within normal limits on room air.  Her physical exam is fairly unremarkable.  I suspect this is a viral URI.  Recommended supportive care with OTC products like Coricidin or plain Mucinex.  Tessalon Perles prescribed.  Push oral fluids and rest.  Reasons for return discussed.  If no significant improvement or worsening of symptoms by Friday, she will contact me and we will plan  to send in Augmentin if appropriate.  Meds ordered this encounter  Medications  . benzonatate (TESSALON PERLES) 100 MG capsule    Sig: Take 1 capsule (100 mg total) by mouth 3 (three) times daily as needed for cough.    Dispense:  20 capsule    Refill:  Mucarabones, DO [place] [phone]

## 2018-01-09 NOTE — Patient Instructions (Addendum)
It appears that you have a viral upper respiratory infection (cold).  Cold symptoms can last up to 2 weeks.  I recommend that you only use cold medications that are safe in high blood pressure like Coricidin (generic is fine) or plain Mucinex.  I sent in benzonatate for you to take up to 3 times a day if needed for cough.  We discussed reasons for return evaluation.  Contact me on Friday if symptoms are not improving at all.    - Get plenty of rest and drink plenty of fluids. - Try to breathe moist air. Use a cold mist humidifier. - Consume warm fluids (soup or tea) to provide relief for a stuffy nose and to loosen phlegm. - For nasal stuffiness, try saline nasal spray or a Neti Pot.  Afrin nasal spray can also be used but this product should not be used longer than 3 days or it will cause rebound nasal stuffiness (worsening nasal congestion). - For sore throat pain relief: use chloraseptic spray, suck on throat lozenges, hard candy or popsicles; gargle with warm salt water (1/4 tsp. salt per 8 oz. of water); and eat soft, bland foods. - Eat a well-balanced diet. If you cannot, ensure you are getting enough nutrients by taking a daily multivitamin. - Avoid dairy products, as they can thicken phlegm. - Avoid alcohol, as it impairs your body's immune system.  CONTACT YOUR DOCTOR IF YOU EXPERIENCE ANY OF THE FOLLOWING: - High fever - Ear pain - Sinus-type headache - Unusually severe cold symptoms - Cough that gets worse while other cold symptoms improve - Flare up of any chronic lung problem, such as asthma - Your symptoms persist longer than 2 weeks

## 2018-01-11 ENCOUNTER — Encounter: Payer: Self-pay | Admitting: *Deleted

## 2018-01-11 ENCOUNTER — Ambulatory Visit (INDEPENDENT_AMBULATORY_CARE_PROVIDER_SITE_OTHER): Payer: Medicare HMO | Admitting: *Deleted

## 2018-01-11 VITALS — BP 151/71 | HR 56 | Ht 59.0 in | Wt 179.0 lb

## 2018-01-11 DIAGNOSIS — Z0000 Encounter for general adult medical examination without abnormal findings: Secondary | ICD-10-CM | POA: Diagnosis not present

## 2018-01-11 NOTE — Patient Instructions (Addendum)
   [name] , Thank you for taking time to come for your Medicare Wellness Visit. I appreciate your ongoing commitment to your health goals. Please review the following plan we discussed and let me know if I can assist you in the future.   These are the goals we discussed: Goals    . Exercise 150 min/wk Moderate Activity       This is a list of the screening recommended for you and due dates:  Health Maintenance  Topic Date Due  . Flu Shot  12/07/2017  . Colon Cancer Screening  07/31/2018  . Mammogram  08/02/2019  . Tetanus Vaccine  03/09/2022  . DEXA scan (bone density measurement)  Completed  .  Hepatitis C: One time screening is recommended by Center for Disease Control  (CDC) for  adults born from [birth year] through 1966.   Completed  . Pneumonia vaccines  Completed

## 2018-01-11 NOTE — Progress Notes (Signed)
Subjective:   [name] is a very pleasant 72 y.o. female who presents for a Medicare Annual Wellness Visit. [name] lives at home with her son, daughter-in-law, and their adopted 60 year old daughter. She has 2 sons and 1 daughter and they all live on the same road. Her husband passed away in 2010-08-07 at the age of 49 from a sudden illness. She is active around her home and walks around [place] several times a week. She also participates in bus trips organized by the [employer]. She does not participate in any formal exercise programs but has considered joining them for chair exercises.    Review of Systems    Patient reports that her overall health is worse compared to last year.  Cardiac Risk Factors include: advanced age (>79men, >31 women);hypertension;sedentary lifestyle;obesity (BMI >30kg/m2)  Musculoskeletal: bilateral hip replacements, knee replacement, and four back surgeries. Sees Dr Orpah Melter for pain management. Hx of length length discrepancy but that was corrected by adding additional length to left leg during hip replacement. This has helped her gait and balance and she no longer has to have her shoe built up.   Urinary: Several episodes of pyelonephritis due to anatomical issue with left kidney and ureter. Stint placed to help promote drainage. Reports that left kidney is functioning at 25% but that Dr Alinda Money has considered removal if she has recurrent infections. Taking Macrobid 50mg  daily for prevention.   All other systems negative       Current Medications (verified) Outpatient Encounter Medications as of 01/11/2018  Medication Sig  . amitriptyline (ELAVIL) 25 MG tablet Take 1 tablet (25 mg total) by mouth at bedtime.  . benzonatate (TESSALON PERLES) 100 MG capsule Take 1 capsule (100 mg total) by mouth 3 (three) times daily as needed for cough.  . gabapentin (NEURONTIN) 300 MG capsule Take 1 capsule (300 mg total) by mouth  2 (two) times daily.  Marland Kitchen lisinopril (PRINIVIL,ZESTRIL) 10 MG tablet Take 1 tablet (10 mg total) by mouth daily.  . methocarbamol (ROBAXIN) 500 MG tablet Take 500 mg by mouth 4 (four) times daily.  . nitrofurantoin (MACRODANTIN) 50 MG capsule Take 1 capsule (50 mg total) by mouth at bedtime. Begin after completing current course of antibiotics.  Marland Kitchen nystatin ointment (MYCOSTATIN) Apply 1 application topically 2 (two) times daily.  Marland Kitchen oxyCODONE-acetaminophen (PERCOCET/ROXICET) 5-325 MG tablet Take 5 tablets by mouth every 6 (six) hours.  . Sennosides (EX-LAX PO) Take 2-4 tablets by mouth daily as needed (CONSTIPATION).   No facility-administered encounter medications on file as of 01/11/2018.     Allergies (verified) Cashew nut oil; Dog epithelium; Ondansetron hcl; Sulfa antibiotics; Sulfamethoxazole; and Zofran   History: Past Medical History:  Diagnosis Date  . Arthritis   . Colon polyp, hyperplastic 04/27/2012   Colonoscopy Aug 06, 2008.  No adenomatous polyps   . Endometrioid adenocarcinoma   . History of endometrial cancer s/p TAH BSO June2013 06/15/2011   Grade 1 endometrioid adenocarcinoma with focal superficial myometrial invasion of 0.1 cm with a myometrium of 1.8 cm or approximately 7%. She had a 5-cm tumor. No lymphovascular space Involvement.    . Hyperlipidemia   . Hypertension   . Mass of in fold of jejunum s/p SB resection 07/19/2012 07/20/2012  . Obesity (BMI 30-39.9) 04/27/2012  . Pyelonephritis 02/2016  . 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 SBO (small bowel obstruction) - recurrent 04/27/2012  . Sepsis due to Escherichia  coli (E. coli) ([place])   . Ureteral adhesion, left 2010   s/p ureterolysis & stenting Dr. Larwance Sachs now  . UTI (urinary tract infection)    Past Surgical History:  Procedure Laterality Date  . ABDOMINAL HYSTERECTOMY  11/02/10   TAH, BSO, lysis of adhesions for endometrial cancer  . [place], 2009   anterior L2-3, L3-4 arthrodesis  . BACK SURGERY  12/17/2012   Spinal fusion and repair   . BLADDER SUSPENSION  2003  . CATARACT EXTRACTION, BILATERAL  2003  . CYSTOSCOPY W/ URETERAL STENT PLACEMENT Left 12/07/2017   Procedure: CYSTOSCOPY WITH RETROGRADE PYELOGRAM/URETERAL STENT PLACEMENT;  Surgeon: Raynelle Bring, MD;  Location: [facility];  Service: Urology;  Laterality: Left;  . EYE SURGERY    . FOOT SURGERY  06/2010   left foot surgery  . HERNIA REPAIR  07/19/12   lap [place] [facility] repair  . INCISIONAL HERNIA REPAIR  11/02/10  . INSERTION OF MESH N/A 04/23/2013   Procedure: INSERTION OF MESH;  Surgeon: Adin Hector, MD;  Location: [facility];  Service: General;  Laterality: N/A;  . JOINT REPLACEMENT  09/2009   Right total knee  . KIDNEY SURGERY  2010   Left kidney  . LAPAROSCOPIC LYSIS OF ADHESIONS N/A 04/23/2013   Procedure: LAPAROSCOPIC LYSIS OF ADHESIONS;  Surgeon: Adin Hector, MD;  Location: [facility];  Service: General;  Laterality: N/A;  . TOTAL HIP ARTHROPLASTY  01/2010   Right total hip  . TOTAL HIP ARTHROPLASTY Left 07/04/2014   Procedure: LEFT TOTAL HIP ARTHROPLASTY ANTERIOR APPROACH;  Surgeon: Gearlean Alf, MD;  Location: [facility];  Service: Orthopedics;  Laterality: Left;  . TOTAL KNEE ARTHROPLASTY     right  . URETEROLYSIS  2010   lap/open left ureterolysis & omental flap, ureter stent  . VENTRAL HERNIA REPAIR N/A 07/19/2012   Procedure: LAPAROSCOPIC LYSIS OF ADHESIONS, SMALL BOWEL RESECTION, SEROSAL REPAIR, PRIMARY VENTRAL HERNIA REPAIR;  Surgeon: Adin Hector, MD;  [facility];  Service: General;  Laterality: N/A;  . VENTRAL HERNIA REPAIR N/A 04/23/2013   Procedure: LAPAROSCOPIC exploration and repair of hernia in abdominal VENTRAL wall  HERNIA;  Surgeon: Adin Hector, MD;  [facility];  Service: General;  Laterality: N/A;   Family History  Problem Relation Age of Onset  . Diabetes Mother   . Cancer Mother        lung  . Diabetes Father   . Cancer  Father        liver  . Colon cancer Sister   . Diabetes Sister   . Cancer Sister        colon  . Seizures Daughter   . Diabetes Son   . Diabetes Sister   . Hypertension Sister   . Kidney disease Sister   . Breast cancer Neg Hx   . Allergic rhinitis Neg Hx   . Angioedema Neg Hx   . Asthma Neg Hx   . Atopy Neg Hx   . Eczema Neg Hx   . Immunodeficiency Neg Hx   . Urticaria Neg Hx    Social History   Socioeconomic History  . Marital status: Widowed    Spouse name: Not on file  . Number of children: 3  . Years of education: 26  . Highest education level: High school graduate  Occupational History  . Occupation: Retired  Scientific laboratory technician  . Financial resource strain: Not hard at all  . Food insecurity:  Worry: Never true    Inability: Never true  . Transportation needs:    Medical: No    Non-medical: No  Tobacco Use  . Smoking status: Never Smoker  . Smokeless tobacco: Never Used  Substance and Sexual Activity  . Alcohol use: No  . Drug use: No  . Sexual activity: Not Currently  Lifestyle  . Physical activity:    Days per week: 0 days    Minutes per session: 0 min  . Stress: Not at all  Relationships  . Social connections:    Talks on phone: More than three times a week    Gets together: More than three times a week    Attends religious service: More than 4 times per year    Active member of club or organization: Yes    Attends meetings of clubs or organizations: More than 4 times per year    Relationship status: Widowed  Other Topics Concern  . Not on file  Social History Narrative  . Not on file    Tobacco Use No.  Clinical Intake:     Pain : 0-10 Pain Type: Chronic pain Pain Location: Back Pain Orientation: Lower Pain Descriptors / Indicators: Aching Pain Onset: More than a month ago Pain Frequency: Constant Effect of Pain on Daily Activities: significant     Nutritional Status: BMI 25 -29 Overweight  What is the last grade level you  completed in school?: high school graduate  Interpreter Needed?: No  Information entered by :: Chong Sicilian, RN   Activities of Daily Living In your present state of health, do you have any difficulty performing the following activities: 01/11/2018 11/29/2017  Hearing? N N  Vision? N N  Comment Cataracts-has regular eye exams -  Difficulty concentrating or making decisions? N N  Comment sometimes forgets what she's going in to a room for -  Walking or climbing stairs? N N  Dressing or bathing? N N  Doing errands, shopping? N Y  Conservation officer, nature and eating ? N -  Using the Toilet? N -  In the past six months, have you accidently leaked urine? N -  Do you have problems with loss of bowel control? N -  Managing your Medications? N -  Managing your Finances? N -  Housekeeping or managing your Housekeeping? N -  Some recent data might be hidden      Diet  Breakfast and Supper and a snack at lunch Eats out about twice a week.   Drinks mostly water and one cup of coffee in the morning  Exercise Current Exercise Habits: The patient does not participate in regular exercise at present, Type of exercise: Other - see comments(works around home with housework and walks up and down stairs often), Time (Minutes): 20, Frequency (Times/Week): 5, Weekly Exercise (Minutes/Week): 100, Intensity: Mild, Exercise limited by: orthopedic condition(s)   Depression Screen PHQ 2/9 Scores 01/11/2018 01/09/2018 12/15/2017 11/17/2017 10/30/2017 09/21/2017 08/22/2017  PHQ - 2 Score 0 0 0 0 0 0 0     Fall Risk Fall Risk  01/11/2018 01/09/2018 12/15/2017 11/17/2017 10/30/2017  Falls in the past year? No No Yes Yes Yes  Number falls in past yr: - - 1 - 1  Injury with Fall? - - No No No  Risk for fall due to : - - - - -  Follow up - - - - -    Safety Is the patient's home free of loose throw rugs in walkways, pet beds, electrical cords,  etc?   yes      Grab bars in the bathroom? yes      Walkin shower? yes      Shower  Seat? yes      Handrails on the stairs?   yes      Adequate lighting?   yes  Patient Care Team: [name], MD as PCP - General (Pediatrics) Nancy Marus, MD as Consulting Physician (Gynecologic Oncology) Sable Feil, MD as Consulting Physician (Gastroenterology) Raynelle Bring, MD as Consulting Physician (Urology)  Hospitalizations, surgeries, and ER visits in previous 12 months Several hospitalizations due to pyelonephritis  Objective:    Today's Vitals   01/11/18 1136  BP: (!) 151/71  Pulse: (!) 56  Weight: 179 lb (81.2 kg)  Height: 4\' 11"  (1.499 m)   Body mass index is 36.15 kg/m.  Advanced Directives 01/11/2018 12/07/2017 12/05/2017 11/28/2017 10/21/2017 08/01/2017 06/25/2017  Does Patient Have a Medical Advance Directive? No No;Yes No No No No No  Type of Advance Directive - [place] in Chart? - No - copy requested No - copy requested - - - -  Would patient like information on creating a medical advance directive? Yes (MAU/Ambulatory/Procedural Areas - Information given) No - Patient declined - Yes (ED - Information included in AVS) No - Patient declined Yes (Inpatient - patient requests chaplain consult to create a medical advance directive) No - Patient declined  Pre-existing out of facility DNR order (yellow form or pink MOST form) - - - - - - -    Hearing/Vision  No hearing or vision deficits noted during visit.  Cognitive Function: MMSE - Mini Mental State Exam 01/11/2018  Orientation to time 5  Orientation to Place 5  Registration 3  Attention/ Calculation 5  Recall 3  Language- name 2 objects 2  Language- repeat 1  Language- follow 3 step command 3  Language- read & follow direction 1  Write a sentence 1  Copy design 1  Total score 30       Normal Cognitive Function Screening: Yes    Immunizations and Health Maintenance Immunization History  Administered Date(s) Administered    . Influenza, High Dose Seasonal PF 02/19/2016, 03/20/2017  . Influenza,inj,Quad PF,6+ Mos 02/21/2013, 03/28/2014, 04/17/2015  . Pneumococcal Conjugate-13 09/30/2014  . Pneumococcal Polysaccharide-23 05/09/2010  . Pneumococcal-Unspecified 05/09/2016  . Tdap 03/09/2012  . Zoster 05/09/2010   Health Maintenance Due  Topic Date Due  . INFLUENZA VACCINE  12/07/2017   Health Maintenance  Topic Date Due  . INFLUENZA VACCINE  12/07/2017  . COLONOSCOPY  07/31/2018  . MAMMOGRAM  08/02/2019  . TETANUS/TDAP  03/09/2022  . DEXA SCAN  Completed  . Hepatitis C Screening  Completed  . PNA vac Low Risk Adult  Completed        Assessment:   This is a routine wellness examination for [name].    Plan:    Goals    . Exercise 150 min/wk Moderate Activity        Health Maintenance Recommendations: Flu Vaccine in October   Additional Screening Recommendations: Lung: Low Dose CT Chest recommended if Age 47-80 years, 30 pack-year currently smoking OR have quit w/in 15years. Patient does not qualify. Hepatitis C Screening recommended: no  Keep f/u with [name], MD and any other specialty appointments you may have Continue current medications Move carefully to avoid falls. Use assistive devices like a cane or  walker if needed. Increase activity level as tolerated. Aim for at least 150 minutes of moderate activity a week. This can be done with chair exercises if necessary. Consider participating in the exercise programs at the [facility].  Read or work on puzzles daily Stay connected with friends and family Advanced Directives given and discussed  I have personally reviewed and noted the following in the patient's chart:   . Medical and social history . Use of alcohol, tobacco or illicit drugs  . Current medications and supplements . Functional ability and status . Nutritional status . Physical activity . Advanced directives . List of other  physicians . Hospitalizations, surgeries, and ER visits in previous 12 months . Vitals . Screenings to include cognitive, depression, and falls . Referrals and appointments  In addition, I have reviewed and discussed with patient certain preventive protocols, quality metrics, and best practice recommendations. A written personalized care plan for preventive services as well as general preventive health recommendations were provided to patient.     Chong Sicilian, RN   01/11/2018

## 2018-02-01 DIAGNOSIS — M961 Postlaminectomy syndrome, not elsewhere classified: Secondary | ICD-10-CM | POA: Diagnosis not present

## 2018-02-01 DIAGNOSIS — M47818 Spondylosis without myelopathy or radiculopathy, sacral and sacrococcygeal region: Secondary | ICD-10-CM | POA: Diagnosis not present

## 2018-02-01 DIAGNOSIS — Z79899 Other long term (current) drug therapy: Secondary | ICD-10-CM | POA: Diagnosis not present

## 2018-02-01 DIAGNOSIS — M7918 Myalgia, other site: Secondary | ICD-10-CM | POA: Diagnosis not present

## 2018-02-01 DIAGNOSIS — M7071 Other bursitis of hip, right hip: Secondary | ICD-10-CM | POA: Diagnosis not present

## 2018-02-01 DIAGNOSIS — R69 Illness, unspecified: Secondary | ICD-10-CM | POA: Diagnosis not present

## 2018-02-01 DIAGNOSIS — M47816 Spondylosis without myelopathy or radiculopathy, lumbar region: Secondary | ICD-10-CM | POA: Diagnosis not present

## 2018-02-07 DIAGNOSIS — N135 Crossing vessel and stricture of ureter without hydronephrosis: Secondary | ICD-10-CM | POA: Diagnosis not present

## 2018-02-13 DIAGNOSIS — G5603 Carpal tunnel syndrome, bilateral upper limbs: Secondary | ICD-10-CM | POA: Diagnosis not present

## 2018-02-16 ENCOUNTER — Other Ambulatory Visit: Payer: Self-pay | Admitting: Urology

## 2018-02-21 DIAGNOSIS — M47816 Spondylosis without myelopathy or radiculopathy, lumbar region: Secondary | ICD-10-CM | POA: Diagnosis not present

## 2018-02-28 ENCOUNTER — Other Ambulatory Visit: Payer: Self-pay

## 2018-02-28 NOTE — Patient Outreach (Signed)
[facility] [facility]) Care Management  02/28/2018  [name] 1945/06/18 [id number]   Referral Date: 02/28/18 Referral Source: [employer] Referral Reason: Unknown   Outreach Attempt: Spoke with patient.  She is able to verify HIPAA.  Discussed with patient referral and [facility] services and how [facility] could support patient.  Patient declines stating she does not have any needs presently.  Advised patient that CM would send letter and brochure for future reference.  Patient in agreement.    Plan: RN CM will send letter and close case.    Jone Baseman, RN, MSN [facility] Care Management Care Management Coordinator Direct Line [phone] Toll Free: [phone]  Fax: [phone]

## 2018-03-13 DIAGNOSIS — G609 Hereditary and idiopathic neuropathy, unspecified: Secondary | ICD-10-CM | POA: Diagnosis not present

## 2018-03-13 DIAGNOSIS — M79676 Pain in unspecified toe(s): Secondary | ICD-10-CM | POA: Diagnosis not present

## 2018-03-13 DIAGNOSIS — L84 Corns and callosities: Secondary | ICD-10-CM | POA: Diagnosis not present

## 2018-03-13 DIAGNOSIS — B351 Tinea unguium: Secondary | ICD-10-CM | POA: Diagnosis not present

## 2018-03-14 ENCOUNTER — Encounter: Payer: Self-pay | Admitting: Pediatrics

## 2018-03-14 ENCOUNTER — Ambulatory Visit (INDEPENDENT_AMBULATORY_CARE_PROVIDER_SITE_OTHER): Payer: Medicare HMO | Admitting: Pediatrics

## 2018-03-14 VITALS — BP 125/74 | HR 61 | Temp 97.7°F | Ht 59.0 in | Wt 184.8 lb

## 2018-03-14 DIAGNOSIS — Z23 Encounter for immunization: Secondary | ICD-10-CM | POA: Diagnosis not present

## 2018-03-14 DIAGNOSIS — G8929 Other chronic pain: Secondary | ICD-10-CM | POA: Diagnosis not present

## 2018-03-14 DIAGNOSIS — I10 Essential (primary) hypertension: Secondary | ICD-10-CM | POA: Diagnosis not present

## 2018-03-14 DIAGNOSIS — Z01818 Encounter for other preprocedural examination: Secondary | ICD-10-CM

## 2018-03-14 DIAGNOSIS — N390 Urinary tract infection, site not specified: Secondary | ICD-10-CM

## 2018-03-14 MED ORDER — GABAPENTIN 300 MG PO CAPS: 300.0000 mg | ORAL_CAPSULE | Freq: Two times a day (BID) | ORAL | 1 refills | Status: DC

## 2018-03-14 MED ORDER — AMITRIPTYLINE HCL 25 MG PO TABS: 25.0000 mg | ORAL_TABLET | Freq: Every day | ORAL | 2 refills | Status: DC

## 2018-03-14 MED ORDER — LISINOPRIL-HYDROCHLOROTHIAZIDE 10-12.5 MG PO TABS: 1.0000 | ORAL_TABLET | Freq: Every day | ORAL | 3 refills | Status: DC

## 2018-03-14 NOTE — Progress Notes (Signed)
  Subjective:   Patient ID: [name], female    DOB: 11/06/1945, 72 y.o.   MRN: [id number] CC: Medical Management of Chronic Issues  HPI: [name] is a 72 y.o. female   Getting ureteral stent removed later this month. Will need to have intermittently exchanged in the future.  Has been feeling well.  No fevers.  Taking nitrofurantoin nightly as prophylaxis.  Has upcoming surgery on hands for carpal tunnel. Walks 2-3 hours at [employer] several days a week for exercise. No SOB or breath or chest pain. Uses cart for stability.  No history of diabetes, stroke or heart attack.  Hypertension: Taking medicine regularly.  No lightheadedness or dizziness with standing.  Chronic pain: Taking amitriptyline and gabapentin.  Also following with pain specialist.  Relevant past medical, surgical, family and social history reviewed. Allergies and medications reviewed and updated. Social History   Tobacco Use  Smoking Status Never Smoker  Smokeless Tobacco Never Used   ROS: Per HPI   Objective:    BP 125/74   Pulse 61   Temp 97.7 F (36.5 C) (Oral)   Ht 4\' 11"  (1.499 m)   Wt 184 lb 12.8 oz (83.8 kg)   BMI 37.33 kg/m   Wt Readings from Last 3 Encounters:  03/14/18 184 lb 12.8 oz (83.8 kg)  01/11/18 179 lb (81.2 kg)  01/09/18 180 lb (81.6 kg)    Gen: NAD, alert, cooperative with exam, NCAT EYES: EOMI, no conjunctival injection, or no icterus ENT:OP without erythema LYMPH: no cervical LAD CV: NRRR, normal S1/S2, no murmur, distal pulses 2+ b/l Resp: CTABL, no wheezes, normal WOB Abd: +BS, soft, NTND.  Ext: No edema, warm Neuro: Alert and oriented  Assessment & Plan:  [name] was seen today for medical management of chronic issues.  Diagnoses and all orders for this visit:  Essential hypertension Stable, continue below -     lisinopril-hydrochlorothiazide (PRINZIDE,ZESTORETIC) 10-12.5 MG tablet; Take 1 tablet by mouth daily.  Other chronic pain Stable, continue  below.  Following with pain management for oxycodone prescriptions. -     gabapentin (NEURONTIN) 300 MG capsule; Take 1 capsule (300 mg total) by mouth 2 (two) times daily. -     amitriptyline (ELAVIL) 25 MG tablet; Take 1 tablet (25 mg total) by mouth at bedtime.  Pre-op exam Upcoming carpal tunnel surgery.  Low risk surgery.  No risk factors on RCRI.  Discussed risk with patient.  Reasonable to proceed.  Recurrent UTI On nitrofurantoin.  Had stent placed now.  Following with urology.  Encounter for immunization -     Flu vaccine HIGH DOSE PF   Follow up plan: Return in about 3 months (around 06/14/2018). [name], MD [place]

## 2018-03-16 DIAGNOSIS — G5601 Carpal tunnel syndrome, right upper limb: Secondary | ICD-10-CM | POA: Diagnosis not present

## 2018-03-16 DIAGNOSIS — G5602 Carpal tunnel syndrome, left upper limb: Secondary | ICD-10-CM | POA: Diagnosis not present

## 2018-03-21 NOTE — Patient Instructions (Signed)
Margaretta Chittum Cobos  03/21/2018   Your procedure is scheduled on: 03-29-18  Report to [facility] Main  Entrance  Report to admitting at      0900 AM    Call this number if you have problems the morning of surgery [phone]    Remember: Do not eat food or drink liquids :After Midnight.  BRUSH YOUR TEETH MORNING OF SURGERY AND RINSE YOUR MOUTH OUT, NO CHEWING GUM CANDY OR MINTS.     Take these medicines the morning of surgery with A SIP OF WATER: robaxin, gabapentin, elavil                                 You may not have any metal on your body including hair pins and              piercings  Do not wear jewelry, make-up, lotions, powders or perfumes, deodorant             Do not wear nail polish.  Do not shave  48 hours prior to surgery.     Do not bring valuables to the hospital. [place].  Contacts, dentures or bridgework may not be worn into surgery.        Patients discharged the day of surgery will not be allowed to drive home.  Name and phone number of your driver:  Special Instructions: N/A              Please read over the following fact sheets you were given: _____________________________________________________________________          [facility] - Preparing for Surgery Before surgery, you can play an important role.  Because skin is not sterile, your skin needs to be as free of germs as possible.  You can reduce the number of germs on your skin by washing with CHG (chlorahexidine gluconate) soap before surgery.  CHG is an antiseptic cleaner which kills germs and bonds with the skin to continue killing germs even after washing. Please DO NOT use if you have an allergy to CHG or antibacterial soaps.  If your skin becomes reddened/irritated stop using the CHG and inform your nurse when you arrive at Short Stay. Do not shave (including legs and underarms) for at least 48 hours prior to the first  CHG shower.  You may shave your face/neck. Please follow these instructions carefully:  1.  Shower with CHG Soap the night before surgery and the  morning of Surgery.  2.  If you choose to wash your hair, wash your hair first as usual with your  normal  shampoo.  3.  After you shampoo, rinse your hair and body thoroughly to remove the  shampoo.                           4.  Use CHG as you would any other liquid soap.  You can apply chg directly  to the skin and wash                       Gently with a scrungie or clean washcloth.  5.  Apply the CHG Soap to your body ONLY FROM  THE NECK DOWN.   Do not use on face/ open                           Wound or open sores. Avoid contact with eyes, ears mouth and genitals (private parts).                       Wash face,  Genitals (private parts) with your normal soap.             6.  Wash thoroughly, paying special attention to the area where your surgery  will be performed.  7.  Thoroughly rinse your body with warm water from the neck down.  8.  DO NOT shower/wash with your normal soap after using and rinsing off  the CHG Soap.                9.  Pat yourself dry with a clean towel.            10.  Wear clean pajamas.            11.  Place clean sheets on your bed the night of your first shower and do not  sleep with pets. Day of Surgery : Do not apply any lotions/deodorants the morning of surgery.  Please wear clean clothes to the hospital/surgery center.  FAILURE TO FOLLOW THESE INSTRUCTIONS MAY RESULT IN THE CANCELLATION OF YOUR SURGERY PATIENT SIGNATURE_________________________________  NURSE SIGNATURE__________________________________  ________________________________________________________________________

## 2018-03-21 NOTE — Progress Notes (Signed)
ekg 11-29-17 epic  cxr 10-21-17 epic

## 2018-03-26 ENCOUNTER — Encounter (HOSPITAL_COMMUNITY)
Admission: RE | Admit: 2018-03-26 | Discharge: 2018-03-26 | Disposition: A | Discharge status: Home / Self Care | Payer: Medicare HMO | Source: Ambulatory Visit | Attending: Urology | Admitting: Urology

## 2018-03-26 ENCOUNTER — Other Ambulatory Visit: Payer: Self-pay

## 2018-03-26 ENCOUNTER — Ambulatory Visit: Payer: Medicare HMO | Admitting: Pediatrics

## 2018-03-26 ENCOUNTER — Encounter (HOSPITAL_COMMUNITY): Payer: Self-pay

## 2018-03-26 DIAGNOSIS — Z87440 Personal history of urinary (tract) infections: Secondary | ICD-10-CM | POA: Diagnosis not present

## 2018-03-26 DIAGNOSIS — Z882 Allergy status to sulfonamides status: Secondary | ICD-10-CM | POA: Diagnosis not present

## 2018-03-26 DIAGNOSIS — Z01812 Encounter for preprocedural laboratory examination: Secondary | ICD-10-CM | POA: Insufficient documentation

## 2018-03-26 DIAGNOSIS — I10 Essential (primary) hypertension: Secondary | ICD-10-CM | POA: Diagnosis not present

## 2018-03-26 DIAGNOSIS — M1990 Unspecified osteoarthritis, unspecified site: Secondary | ICD-10-CM | POA: Diagnosis not present

## 2018-03-26 DIAGNOSIS — Z6835 Body mass index (BMI) 35.0-35.9, adult: Secondary | ICD-10-CM | POA: Diagnosis not present

## 2018-03-26 DIAGNOSIS — Z833 Family history of diabetes mellitus: Secondary | ICD-10-CM | POA: Diagnosis not present

## 2018-03-26 DIAGNOSIS — Z841 Family history of disorders of kidney and ureter: Secondary | ICD-10-CM | POA: Diagnosis not present

## 2018-03-26 DIAGNOSIS — D649 Anemia, unspecified: Secondary | ICD-10-CM | POA: Diagnosis not present

## 2018-03-26 DIAGNOSIS — Z79899 Other long term (current) drug therapy: Secondary | ICD-10-CM | POA: Diagnosis not present

## 2018-03-26 DIAGNOSIS — Z96649 Presence of unspecified artificial hip joint: Secondary | ICD-10-CM | POA: Diagnosis not present

## 2018-03-26 DIAGNOSIS — N131 Hydronephrosis with ureteral stricture, not elsewhere classified: Secondary | ICD-10-CM | POA: Diagnosis not present

## 2018-03-26 DIAGNOSIS — Z981 Arthrodesis status: Secondary | ICD-10-CM | POA: Diagnosis not present

## 2018-03-26 LAB — CBC
HEMATOCRIT: 41.2 % (ref 36.0–46.0)
HEMOGLOBIN: 13.3 g/dL (ref 12.0–15.0)
MCH: 31.7 pg (ref 26.0–34.0)
MCHC: 32.3 g/dL (ref 30.0–36.0)
MCV: 98.1 fL (ref 80.0–100.0)
Platelets: 278 10*3/uL (ref 150–400)
RBC: 4.2 MIL/uL (ref 3.87–5.11)
RDW: 12.9 % (ref 11.5–15.5)
WBC: 8 10*3/uL (ref 4.0–10.5)
nRBC: 0 % (ref 0.0–0.2)

## 2018-03-26 LAB — BASIC METABOLIC PANEL
Anion gap: 9 (ref 5–15)
BUN: 18 mg/dL (ref 8–23)
CO2: 25 mmol/L (ref 22–32)
Calcium: 9 mg/dL (ref 8.9–10.3)
Chloride: 107 mmol/L (ref 98–111)
Creatinine, Ser: 0.77 mg/dL (ref 0.44–1.00)
GFR calc Af Amer: >60 mL/min (ref >60)
GLUCOSE: 95 mg/dL (ref 70–99)
POTASSIUM: 4.2 mmol/L (ref 3.5–5.1)
Sodium: 141 mmol/L (ref 135–145)

## 2018-03-28 NOTE — H&P (Signed)
CC/HPI: Left ureteral obstruction with recurrent pyelonephritis   She returns after having undergone left ureteral stent placement. She had been hospitalized multiple times with episodes of left pyelonephritis. She remains on antibiotic prophylaxis with trimethoprim. She has denied any urinary tract infections since stent placement 2 months ago. She denies any significant urinary urgency, frequency, or flank pain since stent placement.     ALLERGIES: Ondansetron HCl TABS Sulfa Drugs    MEDICATIONS: Trimethoprim 100 mg tablet 1 tablet PO Q HS  Allegra Allergy  Amitriptyline Hcl 25 mg tablet Oral  Lisinopril 20 MG Oral Tablet Oral  Neurontin 300 MG Oral Capsule Oral  Nitrofurantoin Mono-Macro 100 mg capsule  Uristat     GU PSH: Catheterization For Collection Of Specimen, Single Patient, All Places Of Service - 08/01/2017 Cysto Uretero Balloon Dil Strict - 2010 Cystoscopy Insert Stent, Left - 12/07/2017, 2010, 2010 Omental Flap; Intra-abdom - 2010 Release Ureter - 2010 Sling - 2010      PSH Notes: Total Hip Replacement, Ureterolysis, Abdominal Surgery Omental Flap, Intra-abdominal, Cystourethroscopy W/ Ureteroscopy W/ Tx Of Ureteral Strict, Cystoscopy With Insertion Of Ureteral Stent Left, Mid-Urethral Sling Operation, Cystoscopy With Insertion Of Ureteral Stent Left, Back Surgery   NON-GU PSH: Total Hip Replacement - 2016    GU PMH: Flank Pain - 08/01/2017 Acute Cystitis/UTI - 03/01/2017 Chronic cystitis (w/o hematuria), Chronic cystitis - 2016 Ureteral stricture, Ureteral obstruction - 2016, Ureteral stricture, - 2014 Dysuria, Dysuria - 2016 Urinary Tract Inf, Unspec site, Pyuria - 2016, Urinary tract infection, - 2016 Urinary Urgency, Urinary urgency - 2016 Pyelonephritis, Pyelonephritis, acute - 2014      PMH Notes:   1) Left hydronephrosis and ureteral obstruction secondary to ureteral stricture: She is s/p a left anterior lumbar decompression and arthrodesis in  September 2009. This was performed via an anterior retroperitoneal approach. She presented to the emergency department in October 2009 with left lower quadrant pain, dysuria, and fever. A CT scan demonstrated a left lower quadrant fluid collection with left hydronephrosis. She was admitted to the hospital and was seen in consultation by Dr. Jeffie Pollock. A left percutaneous nephrostomy tube was placed. CT guided aspiration of the fluid collection was also performed and the fluid was consistent with a probable lymphocele. An antegrade stent was placed which was then removed in November 2009. She did well until May 2010 when she again presented with flank pain and fever and a renal ultrasound demonstrated left hydronephrosis. She was taken to the OR by Dr. Jeffie Pollock and a retrograde pyelogram showed a long proximal narrowing from the level of the left UPJ to the level of the bifurcation of the aorta. A stent was again placed which was removed in June 2010. I initially evaluated her in July 2010 and she was asymptomatic and she was therefore observed. However, she again developed left flank pain in August 2010 and was taken to the OR for further evaluation. She was found to have a short ureteral stricture at the level of L4 likely a result of an ischemic injury from her prior surgery. This was treated at that time with balloon dilation and stent placement. She again developed pyelonephritis and elected to proceed with definitive surgical repair. She underwent exploration on 03/11/09 and was found to have retroperitoneal fibrosis as the cause of her ureteral obstruction and likely related to her prior surgery. Her ureter was very difficult to remove from the surrounding fibrosis but she was able to undergo definitive ureterolysis and omental wrap. She  was noted to have declining relative renal function of her left kidney in March 2015 (25%). After discussing her options, the complexity of her surgical history, and the fact her  global renal function remained stable, she elected to proceed with observation rather than intervention.   Mar 2019: Left pyelonephritis - E. coli  Jun 2019: Left pyelonephritis - E. coli resistant to fluorquinolones, TMP/SMX, and tetracycline (sensitive to cephalexin)  Aug 2019: Left ureteral stent placement (initial) - 6 x 26    2) Stress incontinence: She is s/p a urethral sling by Dr. Jeffie Pollock in 2005.   3) Recurrent UTIs: She has previously been on prophylaxis with nitrofurantoin but was able to discontinue this therapy in 2013. She developed recurrent pyelonephritis in 2017.       NON-GU PMH: Pyuria/other UA findings - 03/01/2017 Hypertension    FAMILY HISTORY: 1 Daughter - Daughter 2 sons - Son Death of family member - Sister, Father, Mother Diabetes - Sister, Son, Father, Mother End Stage Renal Disease - No Family History   SOCIAL HISTORY: Marital Status: Widowed Preferred Language: English; Ethnicity: Not Hispanic Or Latino; Race: White Current Smoking Status: Patient has never smoked.   Tobacco Use Assessment Completed: Used Tobacco in last 30 days? Does not drink anymore.  Drinks 3 caffeinated drinks per day.    REVIEW OF SYSTEMS:    GU Review Female:   Patient denies frequent urination, hard to postpone urination, burning /pain with urination, get up at night to urinate, leakage of urine, stream starts and stops, trouble starting your stream, have to strain to urinate, and currently pregnant.  Gastrointestinal (Lower):   Patient denies diarrhea and constipation.  Gastrointestinal (Upper):   Patient denies nausea and vomiting.  Constitutional:   Patient denies night sweats, fever, fatigue, and weight loss.  Skin:   Patient denies skin rash/ lesion and itching.  Eyes:   Patient denies blurred vision and double vision.  Ears/ Nose/ Throat:   Patient denies sore throat and sinus problems.  Hematologic/Lymphatic:   Patient denies swollen glands and easy bruising.   Cardiovascular:   Patient denies leg swelling and chest pains.  Respiratory:   Patient denies cough and shortness of breath.  Endocrine:   Patient denies excessive thirst.  Musculoskeletal:   Patient denies back pain and joint pain.  Neurological:   Patient denies headaches and dizziness.  Psychologic:   Patient denies depression and anxiety.   VITAL SIGNS:     Weight 180 lb / 81.65 kg  Height 60 in / 152.4 cm  BP 144/81 mmHg  Pulse 62 /min  BMI 35.2 kg/m   MULTI-SYSTEM PHYSICAL EXAMINATION:    Constitutional: Well-nourished. No physical deformities. Normally developed. Good grooming.  Respiratory: No labored breathing, no use of accessory muscles. Clear bilaterally.  Cardiovascular: Normal temperature, normal extremity pulses, no swelling, no varicosities. Regular rate and rhythm.  Gastrointestinal: No CVA tenderness.     PAST DATA REVIEWED:  Source Of History:  Patient  Urine Test Review:   Urinalysis   PROCEDURES:          Urinalysis w/Scope Dipstick Dipstick Cont'd Micro  Color: Yellow Bilirubin: Neg mg/dL WBC/hpf: NS (Not Seen)  Appearance: Clear Ketones: Neg mg/dL RBC/hpf: 3 - 10/hpf  Specific Gravity: 1.020 Blood: Trace ery/uL Bacteria: NS (Not Seen)  pH: 5.5 Protein: Neg mg/dL Cystals: NS (Not Seen)  Glucose: Neg mg/dL Urobilinogen: 0.2 mg/dL Casts: NS (Not Seen)    Nitrites: Neg Trichomonas: Not Present    Leukocyte Esterase: Neg  leu/uL Mucous: Not Present      Epithelial Cells: NS (Not Seen)      Yeast: NS (Not Seen)      Sperm: Not Present    ASSESSMENT:      ICD-10 Details  1 GU:   Ureteral stricture - N13.5    PLAN:           Schedule Return Visit/Planned Activity: Other See Visit Notes             Note: Will call to schedule surgery.          Document Letter(s):  Created for Patient: Clinical Summary         Notes:   1. Left ureteral obstruction: She has not had any infections since stent placement 2 months ago. She also is not having  bothersome symptoms related to her stent. As such, I recommended that we continue with stent placement for the near term. She will continue trimethoprim prophylaxis and will be scheduled for cystoscopy with left ureteral stent change in the next 1-2 months. We will continue stopping antibiotic prophylaxis at her next follow-up if she still has not had any infections at that time. We will then further consider possibly removing her stent after 6-12 months of stent management to see if she still require stent management.   Cc: Dr. Redge Gainer

## 2018-03-29 ENCOUNTER — Ambulatory Visit (HOSPITAL_COMMUNITY): Payer: Medicare HMO | Admitting: Certified Registered Nurse Anesthetist

## 2018-03-29 ENCOUNTER — Encounter (HOSPITAL_COMMUNITY): Payer: Self-pay | Admitting: *Deleted

## 2018-03-29 ENCOUNTER — Encounter (HOSPITAL_COMMUNITY)
Admission: RE | Disposition: A | Discharge status: Home / Self Care | Payer: Self-pay | Source: Ambulatory Visit | Attending: Urology

## 2018-03-29 ENCOUNTER — Ambulatory Visit (HOSPITAL_COMMUNITY): Payer: Medicare HMO

## 2018-03-29 ENCOUNTER — Ambulatory Visit (HOSPITAL_COMMUNITY)
Admission: RE | Admit: 2018-03-29 | Discharge: 2018-03-29 | Disposition: A | Discharge status: Home / Self Care | Payer: Medicare HMO | Source: Ambulatory Visit | Attending: Urology | Admitting: Urology

## 2018-03-29 DIAGNOSIS — Z882 Allergy status to sulfonamides status: Secondary | ICD-10-CM | POA: Insufficient documentation

## 2018-03-29 DIAGNOSIS — Z87440 Personal history of urinary (tract) infections: Secondary | ICD-10-CM | POA: Insufficient documentation

## 2018-03-29 DIAGNOSIS — Z981 Arthrodesis status: Secondary | ICD-10-CM | POA: Diagnosis not present

## 2018-03-29 DIAGNOSIS — E785 Hyperlipidemia, unspecified: Secondary | ICD-10-CM | POA: Diagnosis not present

## 2018-03-29 DIAGNOSIS — Z833 Family history of diabetes mellitus: Secondary | ICD-10-CM | POA: Diagnosis not present

## 2018-03-29 DIAGNOSIS — Z841 Family history of disorders of kidney and ureter: Secondary | ICD-10-CM | POA: Insufficient documentation

## 2018-03-29 DIAGNOSIS — Z79899 Other long term (current) drug therapy: Secondary | ICD-10-CM | POA: Insufficient documentation

## 2018-03-29 DIAGNOSIS — N131 Hydronephrosis with ureteral stricture, not elsewhere classified: Secondary | ICD-10-CM | POA: Insufficient documentation

## 2018-03-29 DIAGNOSIS — Z96649 Presence of unspecified artificial hip joint: Secondary | ICD-10-CM | POA: Insufficient documentation

## 2018-03-29 DIAGNOSIS — M1990 Unspecified osteoarthritis, unspecified site: Secondary | ICD-10-CM | POA: Insufficient documentation

## 2018-03-29 DIAGNOSIS — Z466 Encounter for fitting and adjustment of urinary device: Secondary | ICD-10-CM | POA: Diagnosis not present

## 2018-03-29 DIAGNOSIS — I10 Essential (primary) hypertension: Secondary | ICD-10-CM | POA: Insufficient documentation

## 2018-03-29 DIAGNOSIS — N135 Crossing vessel and stricture of ureter without hydronephrosis: Secondary | ICD-10-CM | POA: Diagnosis not present

## 2018-03-29 DIAGNOSIS — Z6835 Body mass index (BMI) 35.0-35.9, adult: Secondary | ICD-10-CM | POA: Insufficient documentation

## 2018-03-29 DIAGNOSIS — D649 Anemia, unspecified: Secondary | ICD-10-CM | POA: Insufficient documentation

## 2018-03-29 HISTORY — PX: CYSTOSCOPY W/ URETERAL STENT PLACEMENT: SHX1429

## 2018-03-29 SURGERY — CYSTOSCOPY, FLEXIBLE, WITH STENT REPLACEMENT
Anesthesia: General | Site: Urethra | Laterality: Left

## 2018-03-29 MED ORDER — FENTANYL CITRATE (PF) 100 MCG/2ML IJ SOLN
INTRAMUSCULAR | Status: DC | PRN
  Administered 2018-03-29: 50 ug via INTRAVENOUS

## 2018-03-29 MED ORDER — DIPHENHYDRAMINE HCL 50 MG/ML IJ SOLN
INTRAMUSCULAR | Status: DC | PRN
  Administered 2018-03-29: 12.5 mg via INTRAVENOUS

## 2018-03-29 MED ORDER — LIDOCAINE 2% (20 MG/ML) 5 ML SYRINGE
INTRAMUSCULAR | Status: AC
  Filled 2018-03-29: qty 5

## 2018-03-29 MED ORDER — HYDROMORPHONE HCL 1 MG/ML IJ SOLN: 0.2500 mg | INTRAMUSCULAR | Status: DC | PRN

## 2018-03-29 MED ORDER — ONDANSETRON HCL 4 MG/2ML IJ SOLN
INTRAMUSCULAR | Status: AC
  Filled 2018-03-29: qty 2

## 2018-03-29 MED ORDER — 0.9 % SODIUM CHLORIDE (POUR BTL) OPTIME
TOPICAL | Status: DC | PRN
  Administered 2018-03-29: 1000 mL

## 2018-03-29 MED ORDER — PROMETHAZINE HCL 25 MG/ML IJ SOLN: 6.2500 mg | INTRAMUSCULAR | Status: DC | PRN

## 2018-03-29 MED ORDER — CEFAZOLIN SODIUM-DEXTROSE 2-4 GM/100ML-% IV SOLN
2.0000 g | Freq: Once | INTRAVENOUS | Status: AC
  Administered 2018-03-29: 2 g via INTRAVENOUS
  Filled 2018-03-29: qty 100

## 2018-03-29 MED ORDER — DEXAMETHASONE SODIUM PHOSPHATE 10 MG/ML IJ SOLN
INTRAMUSCULAR | Status: DC | PRN
  Administered 2018-03-29: 10 mg via INTRAVENOUS

## 2018-03-29 MED ORDER — LIDOCAINE 2% (20 MG/ML) 5 ML SYRINGE
INTRAMUSCULAR | Status: DC | PRN
  Administered 2018-03-29: 80 mg via INTRAVENOUS

## 2018-03-29 MED ORDER — LACTATED RINGERS IV SOLN
INTRAVENOUS | Status: DC
  Administered 2018-03-29: 10:00:00 via INTRAVENOUS

## 2018-03-29 MED ORDER — DEXAMETHASONE SODIUM PHOSPHATE 10 MG/ML IJ SOLN
INTRAMUSCULAR | Status: AC
  Filled 2018-03-29: qty 1

## 2018-03-29 MED ORDER — FENTANYL CITRATE (PF) 100 MCG/2ML IJ SOLN
INTRAMUSCULAR | Status: AC
  Filled 2018-03-29: qty 2

## 2018-03-29 MED ORDER — PROPOFOL 10 MG/ML IV BOLUS
INTRAVENOUS | Status: AC
  Filled 2018-03-29: qty 20

## 2018-03-29 MED ORDER — DIPHENHYDRAMINE HCL 50 MG/ML IJ SOLN
INTRAMUSCULAR | Status: AC
  Filled 2018-03-29: qty 1

## 2018-03-29 MED ORDER — PROPOFOL 10 MG/ML IV BOLUS
INTRAVENOUS | Status: DC | PRN
  Administered 2018-03-29: 150 mg via INTRAVENOUS

## 2018-03-29 MED ORDER — SODIUM CHLORIDE 0.9 % IR SOLN
Status: DC | PRN
  Administered 2018-03-29: 3000 mL via INTRAVESICAL

## 2018-03-29 SURGICAL SUPPLY — 11 items
BAG URO CATCHER STRL LF (MISCELLANEOUS) ×2 IMPLANT
CATH INTERMIT  6FR 70CM (CATHETERS) ×2 IMPLANT
CLOTH BEACON ORANGE TIMEOUT ST (SAFETY) ×2 IMPLANT
COVER WAND RF STERILE (DRAPES) IMPLANT
GLOVE BIOGEL M STRL SZ7.5 (GLOVE) ×2 IMPLANT
GOWN STRL REUS W/TWL LRG LVL3 (GOWN DISPOSABLE) ×4 IMPLANT
GUIDEWIRE STR DUAL SENSOR (WIRE) ×2 IMPLANT
MANIFOLD NEPTUNE II (INSTRUMENTS) ×2 IMPLANT
PACK CYSTO (CUSTOM PROCEDURE TRAY) ×2 IMPLANT
STENT URET 6FRX24 CONTOUR (STENTS) ×1 IMPLANT
TUBING CONNECTING 10 (TUBING) ×2 IMPLANT

## 2018-03-29 NOTE — Anesthesia Preprocedure Evaluation (Addendum)
Anesthesia Evaluation  Patient identified by MRN, date of birth, ID band Patient awake    Reviewed: Allergy & Precautions, NPO status , Patient's Chart, lab work & pertinent test results  Airway Mallampati: II  TM Distance: >3 FB Neck ROM: Full    Dental  (+) Dental Advisory Given, Upper Dentures, Lower Dentures   Pulmonary neg pulmonary ROS,    breath sounds clear to auscultation       Cardiovascular hypertension, Pt. on medications  Rhythm:Regular Rate:Normal     Neuro/Psych    GI/Hepatic negative GI ROS, Neg liver ROS,   Endo/Other  Morbid obesity  Renal/GU Renal disease     Musculoskeletal  (+) Arthritis ,   Abdominal   Peds  Hematology  (+) anemia ,   Anesthesia Other Findings   Reproductive/Obstetrics                             Lab Results  Component Value Date   WBC 8.0 03/26/2018   HGB 13.3 03/26/2018   HCT 41.2 03/26/2018   MCV 98.1 03/26/2018   PLT 278 03/26/2018   Lab Results  Component Value Date   CREATININE 0.77 03/26/2018   BUN 18 03/26/2018   NA 141 03/26/2018   K 4.2 03/26/2018   CL 107 03/26/2018   CO2 25 03/26/2018    Anesthesia Physical  Anesthesia Plan  ASA: III  Anesthesia Plan: General   Post-op Pain Management:    Induction: Intravenous  PONV Risk Score and Plan: 3 and Dexamethasone, Treatment may vary due to age or medical condition and Diphenhydramine  Airway Management Planned: LMA  Additional Equipment:   Intra-op Plan:   Post-operative Plan: Extubation in OR  Informed Consent: I have reviewed the patients History and Physical, chart, labs and discussed the procedure including the risks, benefits and alternatives for the proposed anesthesia with the patient or authorized representative who has indicated his/her understanding and acceptance.   Dental advisory given  Plan Discussed with: CRNA  Anesthesia Plan Comments:         Anesthesia Quick Evaluation

## 2018-03-29 NOTE — Discharge Instructions (Addendum)
1. You may see some blood in the urine and may have some burning with urination for 48-72 hours. You also may notice that you have to urinate more frequently or urgently after your procedure which is normal.  °2. You should call should you develop an inability urinate, fever > 101, persistent nausea and vomiting that prevents you from eating or drinking to stay hydrated.  °3. If you have a stent, you will likely urinate more frequently and urgently until the stent is removed and you may experience some discomfort/pain in the lower abdomen and flank especially when urinating. You may take pain medication prescribed to you if needed for pain. You may also intermittently have blood in the urine until the stent is removed. ° ° ° °General Anesthesia, Adult, Care After °These instructions provide you with information about caring for yourself after your procedure. Your health care provider may also give you more specific instructions. Your treatment has been planned according to current medical practices, but problems sometimes occur. Call your health care provider if you have any problems or questions after your procedure. °What can I expect after the procedure? °After the procedure, it is common to have: °· Vomiting. °· A sore throat. °· Mental slowness. ° °It is common to feel: °· Nauseous. °· Cold or shivery. °· Sleepy. °· Tired. °· Sore or achy, even in parts of your body where you did not have surgery. ° °Follow these instructions at home: °For at least 24 hours after the procedure: °· Do not: °? Participate in activities where you could fall or become injured. °? Drive. °? Use heavy machinery. °? Drink alcohol. °? Take sleeping pills or medicines that cause drowsiness. °? Make important decisions or sign legal documents. °? Take care of children on your own. °· Rest. °Eating and drinking °· If you vomit, drink water, juice, or soup when you can drink without vomiting. °· Drink enough fluid to keep your urine clear  or pale yellow. °· Make sure you have little or no nausea before eating solid foods. °· Follow the diet recommended by your health care provider. °General instructions °· Have a responsible adult stay with you until you are awake and alert. °· Return to your normal activities as told by your health care provider. Ask your health care provider what activities are safe for you. °· Take over-the-counter and prescription medicines only as told by your health care provider. °· If you smoke, do not smoke without supervision. °· Keep all follow-up visits as told by your health care provider. This is important. °Contact a health care provider if: °· You continue to have nausea or vomiting at home, and medicines are not helpful. °· You cannot drink fluids or start eating again. °· You cannot urinate after 8-12 hours. °· You develop a skin rash. °· You have fever. °· You have increasing redness at the site of your procedure. °Get help right away if: °· You have difficulty breathing. °· You have chest pain. °· You have unexpected bleeding. °· You feel that you are having a life-threatening or urgent problem. °This information is not intended to replace advice given to you by your health care provider. Make sure you discuss any questions you have with your health care provider. °Document Released: 08/01/2000 Document Revised: 09/28/2015 Document Reviewed: 04/09/2015 °Elsevier Interactive Patient Education © 2018 [employer] ° ° ° °

## 2018-03-29 NOTE — Op Note (Signed)
Preoperative diagnosis:  1. Left ureteral obstruction   Postoperative diagnosis:  1. Left ureteral obstruction   Procedure:  1. Cystoscopy 2. Left ureteral stent placement (6 x 24 with no string)  Surgeon: Roxy Horseman, Brooke Bonito. M.D.  Anesthesia: General  Complications: None  Intraoperative findings: Her stent was not encrusted.  EBL: Minimal  Specimens: None  Indication: [name] is a 72 y.o. patient with left ureteral obstruction. After reviewing the management options for treatment, he elected to proceed with the above surgical procedure(s). We have discussed the potential benefits and risks of the procedure, side effects of the proposed treatment, the likelihood of the patient achieving the goals of the procedure, and any potential problems that might occur during the procedure or recuperation. Informed consent has been obtained.  Description of procedure:  The patient was taken to the operating room and general anesthesia was induced.  The patient was placed in the dorsal lithotomy position, prepped and draped in the usual sterile fashion, and preoperative antibiotics were administered. A preoperative time-out was performed.   Cystourethroscopy was performed.  The patient's urethra was examined and was unremarkable. The bladder was then systematically examined in its entirety. There was no evidence for any bladder tumors, stones, or other mucosal pathology.    Attention then turned to the left ureteral orifice and the patient's indwelling ureteral stent was identified and brought out to the urethral meatus with the flexible graspers.  A 0.38 sensor guidewire was then advanced up the left ureter into the renal pelvis under fluoroscopic guidance.  The wire was then backloaded through the cystoscope and a ureteral stent was advance over the wire using Seldinger technique.  The stent was positioned appropriately under fluoroscopic and cystoscopic guidance.  The wire was then  removed with an adequate stent curl noted in the renal pelvis as well as in the bladder.  The bladder was then emptied and the procedure ended.  The patient appeared to tolerate the procedure well and without complications.  The patient was able to be awakened and transferred to the recovery unit in satisfactory condition.    Pryor Curia MD

## 2018-03-29 NOTE — Anesthesia Procedure Notes (Signed)
Procedure Name: LMA Insertion Date/Time: 03/29/2018 11:44 AM Performed by: Montel Clock, CRNA Pre-anesthesia Checklist: Patient identified, Emergency Drugs available, Suction available, Patient being monitored and Timeout performed Patient Re-evaluated:Patient Re-evaluated prior to induction Oxygen Delivery Method: Circle system utilized Preoxygenation: Pre-oxygenation with 100% oxygen Induction Type: IV induction Ventilation: Mask ventilation without difficulty LMA: LMA with gastric port inserted LMA Size: 3.0 Number of attempts: 1 Dental Injury: Teeth and Oropharynx as per pre-operative assessment

## 2018-03-29 NOTE — Transfer of Care (Signed)
Immediate Anesthesia Transfer of Care Note  Patient: [name]  Procedure(s) Performed: CYSTOSCOPY WITH STENT  EXCHANGE (Left Urethra)  Patient Location: PACU  Anesthesia Type:General  Level of Consciousness: drowsy and patient cooperative  Airway & Oxygen Therapy: Patient Spontanous Breathing and Patient connected to face mask oxygen  Post-op Assessment: Report given to RN and Post -op Vital signs reviewed and stable  Post vital signs: Reviewed and stable  Last Vitals:  Vitals Value Taken Time  BP    Temp    Pulse 58 03/29/2018 12:08 PM  Resp 20 03/29/2018 12:08 PM  SpO2 100 % 03/29/2018 12:08 PM  Vitals shown include unvalidated device data.  Last Pain:  Vitals:   03/29/18 0937  TempSrc:   PainSc: 0-No pain         Complications: No apparent anesthesia complications

## 2018-03-29 NOTE — Interval H&P Note (Signed)
History and Physical Interval Note:  03/29/2018 10:57 AM  [name]  has presented today for surgery, with the diagnosis of LEFT URETERAL OBSTRUCTION  The various methods of treatment have been discussed with the patient and family. After consideration of risks, benefits and other options for treatment, the patient has consented to  Procedure(s): CYSTOSCOPY WITH STENT  EXCHANGE (Left) as a surgical intervention .  The patient's history has been reviewed, patient examined, no change in status, stable for surgery.  I have reviewed the patient's chart and labs.  Questions were answered to the patient's satisfaction.     Teagan Heidrick,LES

## 2018-03-29 NOTE — Anesthesia Postprocedure Evaluation (Signed)
Anesthesia Post Note  Patient: [name]  Procedure(s) Performed: CYSTOSCOPY WITH STENT  EXCHANGE (Left Urethra)     Patient location during evaluation: PACU Anesthesia Type: General Level of consciousness: sedated Pain management: pain level controlled Vital Signs Assessment: post-procedure vital signs reviewed and stable Respiratory status: spontaneous breathing and respiratory function stable Cardiovascular status: stable Postop Assessment: no apparent nausea or vomiting Anesthetic complications: no    Last Vitals:  Vitals:   03/29/18 1230 03/29/18 1245  BP: (!) 141/65 (!) 141/62  Pulse: (!) 52 (!) 52  Resp: 11 14  Temp:  36.6 C  SpO2: 97% 98%    Last Pain:  Vitals:   03/29/18 1245  TempSrc:   PainSc: 0-No pain                 Briel Gallicchio DANIEL

## 2018-03-30 ENCOUNTER — Encounter (HOSPITAL_COMMUNITY): Payer: Self-pay | Admitting: Urology

## 2018-04-17 DIAGNOSIS — G5601 Carpal tunnel syndrome, right upper limb: Secondary | ICD-10-CM | POA: Diagnosis not present

## 2018-04-17 DIAGNOSIS — G5602 Carpal tunnel syndrome, left upper limb: Secondary | ICD-10-CM | POA: Diagnosis not present

## 2018-04-26 ENCOUNTER — Ambulatory Visit: Payer: Medicare HMO | Admitting: Family

## 2018-04-26 ENCOUNTER — Ambulatory Visit (INDEPENDENT_AMBULATORY_CARE_PROVIDER_SITE_OTHER): Payer: Medicare HMO | Admitting: Nurse Practitioner

## 2018-04-26 ENCOUNTER — Encounter: Payer: Self-pay | Admitting: Nurse Practitioner

## 2018-04-26 VITALS — BP 141/76 | HR 76 | Temp 97.0°F | Ht 60.0 in | Wt 189.0 lb

## 2018-04-26 DIAGNOSIS — R42 Dizziness and giddiness: Secondary | ICD-10-CM | POA: Diagnosis not present

## 2018-04-26 DIAGNOSIS — H6522 Chronic serous otitis media, left ear: Secondary | ICD-10-CM | POA: Diagnosis not present

## 2018-04-26 MED ORDER — FLUTICASONE PROPIONATE 50 MCG/ACT NA SUSP: 2.0000 | Freq: Every day | NASAL | 6 refills | Status: DC

## 2018-04-26 MED ORDER — MECLIZINE HCL 25 MG PO TABS: 25.0000 mg | ORAL_TABLET | Freq: Three times a day (TID) | ORAL | 0 refills | Status: DC | PRN

## 2018-04-26 NOTE — Progress Notes (Signed)
   Subjective:    Patient ID: [name], female    DOB: 13-Apr-1946, 72 y.o.   MRN: [id number]   Chief Complaint: Feels like she has fluid behind her ears   HPI Patient comes in today c/o feeling like she has fluid behind her ears. Slight dizziness that started over the last3-4 days. She has not taken any meds.   Review of Systems  Constitutional: Negative.   HENT: Negative.   Respiratory: Negative.   Cardiovascular: Negative.   Gastrointestinal: Negative.   Genitourinary: Negative.   Neurological: Positive for dizziness. Negative for light-headedness and headaches.  Psychiatric/Behavioral: Negative.   All other systems reviewed and are negative.      Objective:   Physical Exam Constitutional:      General: She is not in acute distress.    Appearance: Normal appearance. She is not ill-appearing.  HENT:     Right Ear: Tympanic membrane, ear canal and external ear normal.     Left Ear: Tympanic membrane, ear canal and external ear normal.     Nose: Nose normal.     Mouth/Throat:     Mouth: Mucous membranes are dry.  Eyes:     Extraocular Movements: Extraocular movements intact.     Pupils: Pupils are equal, round, and reactive to light.  Neck:     Musculoskeletal: Normal range of motion.  Cardiovascular:     Rate and Rhythm: Normal rate and regular rhythm.  Pulmonary:     Effort: Pulmonary effort is normal.     Breath sounds: Normal breath sounds.  Skin:    General: Skin is warm.  Neurological:     General: No focal deficit present.     Mental Status: She is alert and oriented to person, place, and time.     Cranial Nerves: No cranial nerve deficit.     Comments: Negative romberg  Psychiatric:        Mood and Affect: Mood normal.    BP (!) 141/76   Pulse 76   Temp (!) 97 F (36.1 C) (Oral)   Ht 5' (1.524 m)   Wt 189 lb (85.7 kg)   BMI 36.91 kg/m        Assessment & Plan:  [name] in today with chief complaint of Feels like she has  fluid behind her ears   1. Vertigo - meclizine (ANTIVERT) 25 MG tablet; Take 1 tablet (25 mg total) by mouth 3 (three) times daily as needed for dizziness.  Dispense: 30 tablet; Refill: 0  2. Left chronic serous otitis media Drink lot sof liquids Rest  RTO prn - fluticasone (FLONASE) 50 MCG/ACT nasal spray; Place 2 sprays into both nostrils daily.  Dispense: 16 g; Refill: [place], FNP

## 2018-04-26 NOTE — Patient Instructions (Signed)
Otitis Media With Effusion, Pediatric    Otitis media with effusion (OME) occurs when there is inflammation of the middle ear and fluid in the middle ear space. There are no signs and symptoms of infection. The middle ear space contains air and the bones for hearing. Air in the middle ear space helps to transmit sound to the brain.  OME is a common condition in children, and it often occurs after an ear infection. This condition may be present for several weeks or longer after an ear infection. Most cases of this condition get better on their own.  What are the causes?  OME is caused by a blockage of the eustachian tube in one or both ears. These tubes drain fluid in the ears to the back of the nose (nasopharynx). If the tissue in the tube swells up (edema), the tube closes. This prevents fluid from draining. Blockage can be caused by:  · Ear infections.  · Colds and other upper respiratory infections.  · Allergies.  · Irritants, such as tobacco smoke.  · Enlarged adenoids. The adenoids are areas of soft tissue located high in the back of the throat, behind the nose and the roof of the mouth. They are part of the body’s natural defense (immune) system.  · A mass in the nasopharynx.  · Damage to the ear caused by pressure changes (barotrauma).  What increases the risk?  Your child is more likely to develop this condition if:  · He or she has repeated ear and sinus infections.  · He or she has allergies.  · He or she is exposed to tobacco smoke.  · He or she attends daycare.  · He or she is not breastfed.  What are the signs or symptoms?  Symptoms of this condition may not be obvious. Sometimes this condition does not have any symptoms, or symptoms may overlap with those of a cold or upper respiratory tract illness.  Symptoms of this condition include:  · Temporary hearing loss.  · A feeling of fullness in the ear without pain.  · Irritability or agitation.  · Balance (vestibular) problems.  As a result of hearing  loss, your child may:  · Listen to the TV at a loud volume.  · Not respond to questions.  · Ask "What?" often when spoken to.  · Mistake or confuse one sound or word for another.  · Perform poorly at school.  · Have a poor attention span.  · Become agitated or irritated easily.  How is this diagnosed?  This condition is diagnosed with an ear exam. Your child's health care provider will look inside your child's ear with an instrument (otoscope) to check for redness, swelling, and fluid.  Other tests may be done, including:  · A test to check the movement of the eardrum (pneumatic otoscopy). This is done by squeezing a small amount of air into the ear.  · A test that changes air pressure in the middle ear to check how well the eardrum moves and to see if the eustachian tube is working (tympanogram).  · Hearing test (audiogram). This test involves playing tones at different pitches to see if your child can hear each tone.  How is this treated?  Treatment for this condition depends on the cause. In many cases, the fluid goes away on its own.  In some cases, your child may need a procedure to create a hole in the eardrum to allow fluid to drain (myringotomy) and to   insert small drainage tubes (tympanostomy tubes) into the eardrums. These tubes help to drain fluid and prevent infection. This procedure may be recommended if:  · OME does not get better over several months.  · Your child has many ear infections within several months.  · Your child has noticeable hearing loss.  · Your child has problems with speech and language development.  Surgery may also be done to remove the adenoids (adenoidectomy).  Follow these instructions at home:  · Give over-the-counter and prescription medicines only as told by your child's health care provider.  · Keep children away from any tobacco smoke.  · Keep all follow-up visits as told by your child's health care provider. This is important.  How is this prevented?  · Keep your child's  vaccinations up to date. Make sure your child gets all recommended vaccinations, including a pneumonia and flu vaccine.  · Encourage hand washing. Your child should wash his or her hands often with soap and water. If there is no soap and water, he or she should use hand sanitizer.  · Avoid exposing your child to tobacco smoke.  · Breastfeed your baby, if possible. Babies who are breastfed as long as possible are less likely to develop this condition.  Contact a health care provider if:  · Your child's hearing does not get better after 3 months.  · Your child's hearing is worse.  · Your child has ear pain.  · Your child has a fever.  · Your child has drainage from the ear.  · Your child is dizzy.  · Your child has a lump on his or her neck.  Get help right away if:  · Your child has bleeding from the nose.  · Your child cannot move part of her or his face.  · Your child has trouble breathing.  · Your child cannot smell.  · Your child develops severe congestion.  · Your child develops weakness.  · Your child who is younger than 3 months has a temperature of 100°F (38°C) or higher.  Summary  · Otitis media with effusion (OME) occurs when there is inflammation of the middle ear and fluid in the middle ear space.  · This condition is caused by blockage of one or both eustachian tubes, which drain fluid in the ears to the back of the nose.  · Symptoms of this condition can include temporary hearing loss, a feeling of fullness in the ear, irritability or agitation, and balance (vestibular) problems. Sometimes, there are no symptoms.  · This condition is diagnosed with an ear exam and tests, such as pneumatic otoscopy, tympanogram, and audiogram.  · Treatment for this condition depends on the cause. In many cases, the fluid goes away on its own.  This information is not intended to replace advice given to you by your health care provider. Make sure you discuss any questions you have with your health care provider.  Document  Released: 07/16/2003 Document Revised: 03/17/2016 Document Reviewed: 03/17/2016  Elsevier Interactive Patient Education © 2019 [employer]

## 2018-04-30 DIAGNOSIS — M7918 Myalgia, other site: Secondary | ICD-10-CM | POA: Diagnosis not present

## 2018-04-30 DIAGNOSIS — M47816 Spondylosis without myelopathy or radiculopathy, lumbar region: Secondary | ICD-10-CM | POA: Diagnosis not present

## 2018-04-30 DIAGNOSIS — R69 Illness, unspecified: Secondary | ICD-10-CM | POA: Diagnosis not present

## 2018-04-30 DIAGNOSIS — G5603 Carpal tunnel syndrome, bilateral upper limbs: Secondary | ICD-10-CM | POA: Diagnosis not present

## 2018-04-30 DIAGNOSIS — M461 Sacroiliitis, not elsewhere classified: Secondary | ICD-10-CM | POA: Diagnosis not present

## 2018-04-30 DIAGNOSIS — M961 Postlaminectomy syndrome, not elsewhere classified: Secondary | ICD-10-CM | POA: Diagnosis not present

## 2018-04-30 DIAGNOSIS — Z79899 Other long term (current) drug therapy: Secondary | ICD-10-CM | POA: Diagnosis not present

## 2018-04-30 DIAGNOSIS — M47818 Spondylosis without myelopathy or radiculopathy, sacral and sacrococcygeal region: Secondary | ICD-10-CM | POA: Diagnosis not present

## 2018-04-30 DIAGNOSIS — M7071 Other bursitis of hip, right hip: Secondary | ICD-10-CM | POA: Diagnosis not present

## 2018-05-19 ENCOUNTER — Other Ambulatory Visit: Payer: Self-pay | Admitting: Family Medicine

## 2018-05-19 DIAGNOSIS — G8929 Other chronic pain: Secondary | ICD-10-CM

## 2018-05-21 DIAGNOSIS — G5601 Carpal tunnel syndrome, right upper limb: Secondary | ICD-10-CM | POA: Diagnosis not present

## 2018-05-22 NOTE — Telephone Encounter (Signed)
OV 06/20/18 

## 2018-06-19 DIAGNOSIS — R8279 Other abnormal findings on microbiological examination of urine: Secondary | ICD-10-CM | POA: Diagnosis not present

## 2018-06-19 DIAGNOSIS — N135 Crossing vessel and stricture of ureter without hydronephrosis: Secondary | ICD-10-CM | POA: Diagnosis not present

## 2018-06-20 ENCOUNTER — Encounter: Payer: Self-pay | Admitting: Physician Assistant

## 2018-06-20 ENCOUNTER — Ambulatory Visit (INDEPENDENT_AMBULATORY_CARE_PROVIDER_SITE_OTHER): Payer: Medicare HMO | Admitting: Physician Assistant

## 2018-06-20 ENCOUNTER — Ambulatory Visit (INDEPENDENT_AMBULATORY_CARE_PROVIDER_SITE_OTHER): Payer: Medicare HMO

## 2018-06-20 ENCOUNTER — Ambulatory Visit: Payer: Medicare HMO | Admitting: Pediatrics

## 2018-06-20 VITALS — BP 117/67 | HR 56 | Temp 97.3°F | Ht 60.0 in | Wt 189.4 lb

## 2018-06-20 DIAGNOSIS — Z0000 Encounter for general adult medical examination without abnormal findings: Secondary | ICD-10-CM

## 2018-06-20 DIAGNOSIS — M546 Pain in thoracic spine: Secondary | ICD-10-CM

## 2018-06-20 DIAGNOSIS — M5440 Lumbago with sciatica, unspecified side: Secondary | ICD-10-CM

## 2018-06-20 DIAGNOSIS — I10 Essential (primary) hypertension: Secondary | ICD-10-CM | POA: Diagnosis not present

## 2018-06-20 DIAGNOSIS — G8929 Other chronic pain: Secondary | ICD-10-CM

## 2018-06-20 DIAGNOSIS — M545 Low back pain: Secondary | ICD-10-CM | POA: Diagnosis not present

## 2018-06-20 DIAGNOSIS — E785 Hyperlipidemia, unspecified: Secondary | ICD-10-CM | POA: Diagnosis not present

## 2018-06-20 DIAGNOSIS — R799 Abnormal finding of blood chemistry, unspecified: Secondary | ICD-10-CM | POA: Diagnosis not present

## 2018-06-20 LAB — BAYER DCA HB A1C WAIVED: HB A1C (BAYER DCA - WAIVED): 5.8 % (ref <7.0)

## 2018-06-21 ENCOUNTER — Other Ambulatory Visit: Payer: Self-pay | Admitting: Urology

## 2018-06-21 LAB — CMP14+EGFR
A/G RATIO: 1.7 (ref 1.2–2.2)
ALT: 21 IU/L (ref 0–32)
AST: 18 IU/L (ref 0–40)
Albumin: 4.2 g/dL (ref 3.7–4.7)
Alkaline Phosphatase: 60 IU/L (ref 39–117)
BUN/Creatinine Ratio: 17 (ref 12–28)
BUN: 15 mg/dL (ref 8–27)
Bilirubin Total: 0.4 mg/dL (ref 0.0–1.2)
CALCIUM: 9 mg/dL (ref 8.7–10.3)
CO2: 21 mmol/L (ref 20–29)
Chloride: 104 mmol/L (ref 96–106)
Creatinine, Ser: 0.88 mg/dL (ref 0.57–1.00)
GFR, EST AFRICAN AMERICAN: 75 mL/min/{1.73_m2} (ref >59)
GFR, EST NON AFRICAN AMERICAN: 65 mL/min/{1.73_m2} (ref >59)
Globulin, Total: 2.5 g/dL (ref 1.5–4.5)
Glucose: 82 mg/dL (ref 65–99)
POTASSIUM: 4.7 mmol/L (ref 3.5–5.2)
Sodium: 140 mmol/L (ref 134–144)
TOTAL PROTEIN: 6.7 g/dL (ref 6.0–8.5)

## 2018-06-21 LAB — CBC WITH DIFFERENTIAL/PLATELET
BASOS ABS: 0 10*3/uL (ref 0.0–0.2)
Basos: 0 %
EOS (ABSOLUTE): 0.2 10*3/uL (ref 0.0–0.4)
Eos: 2 %
HEMATOCRIT: 36.9 % (ref 34.0–46.6)
Hemoglobin: 12.8 g/dL (ref 11.1–15.9)
Immature Grans (Abs): 0 10*3/uL (ref 0.0–0.1)
Immature Granulocytes: 0 %
LYMPHS ABS: 3.4 10*3/uL — AB (ref 0.7–3.1)
Lymphs: 48 %
MCH: 32.2 pg (ref 26.6–33.0)
MCHC: 34.7 g/dL (ref 31.5–35.7)
MCV: 93 fL (ref 79–97)
MONOS ABS: 0.5 10*3/uL (ref 0.1–0.9)
Monocytes: 7 %
Neutrophils Absolute: 3.1 10*3/uL (ref 1.4–7.0)
Neutrophils: 43 %
PLATELETS: 310 10*3/uL (ref 150–450)
RBC: 3.98 x10E6/uL (ref 3.77–5.28)
RDW: 12.5 % (ref 11.7–15.4)
WBC: 7.2 10*3/uL (ref 3.4–10.8)

## 2018-06-21 LAB — LIPID PANEL
Chol/HDL Ratio: 2.7 ratio (ref 0.0–4.4)
Cholesterol, Total: 190 mg/dL (ref 100–199)
HDL: 71 mg/dL (ref >39)
LDL CALC: 93 mg/dL (ref 0–99)
Triglycerides: 128 mg/dL (ref 0–149)
VLDL Cholesterol Cal: 26 mg/dL (ref 5–40)

## 2018-06-21 LAB — TSH: TSH: 1.26 u[IU]/mL (ref 0.450–4.500)

## 2018-06-24 NOTE — Progress Notes (Signed)
BP 117/67   Pulse (!) 56   Temp (!) 97.3 F (36.3 C) (Oral)   Ht 5' (1.524 m)   Wt 189 lb 6.4 oz (85.9 kg)   BMI 36.99 kg/m    Subjective:    Patient ID: [name], female    DOB: 20-Feb-1946, 73 y.o.   MRN: [id number]  HPI: [name] is a 73 y.o. female presenting on 06/20/2018 for Hypertension (Dr. Evette Doffing patient); Cough; and Nasal Congestion  She comes in for a recheck on her medical conditions.  She goes to [facility] for chronic pain and has old history.  She recently had a cold but is better.  She does have allergies.  She deals with chronic pain and would go to PT for evaluation.  She needs labs. She has no other complaints at this time.  Past Medical History:  Diagnosis Date  . Arthritis   . Colon polyp, hyperplastic 04/27/2012   Colonoscopy 2010.  No adenomatous polyps   . Endometrioid adenocarcinoma   . History of endometrial cancer s/p TAH BSO June2013 06/15/2011   Grade 1 endometrioid adenocarcinoma with focal superficial myometrial invasion of 0.1 cm with a myometrium of 1.8 cm or approximately 7%. She had a 5-cm tumor. No lymphovascular space Involvement.    . Hyperlipidemia   . Hypertension   . Mass of in fold of jejunum s/p SB resection 07/19/2012 07/20/2012  . Obesity (BMI 30-39.9) 04/27/2012  . Pyelonephritis 02/2016  . 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 SBO (small bowel obstruction) - recurrent 04/27/2012  . Sepsis due to Escherichia coli (E. coli) ([place])   . Ureteral adhesion, left 2010   s/p ureterolysis & stenting Dr. Larwance Sachs now  . UTI (urinary tract infection)    Relevant past medical, surgical, family and social history reviewed and updated as indicated. Interim medical history since our last visit reviewed. Allergies and medications reviewed and updated. DATA REVIEWED: CHART IN EPIC  Family History reviewed for pertinent findings.  Review of Systems  Constitutional:  Negative.  Negative for activity change, fatigue and fever.  HENT: Positive for congestion.   Eyes: Negative.   Respiratory: Negative.  Negative for cough.   Cardiovascular: Negative.  Negative for chest pain.  Gastrointestinal: Negative.  Negative for abdominal pain.  Endocrine: Negative.   Genitourinary: Negative.  Negative for dysuria.  Musculoskeletal: Positive for arthralgias, gait problem and myalgias.  Skin: Negative.     Allergies as of 06/20/2018      Reactions   Cashew Nut Oil Hives   Dog Epithelium Hives, Itching   Ondansetron Hcl Nausea And Vomiting   "made me throw up"   Sulfa Antibiotics    "blistering lips and mouth sores"      Medication List       Accurate as of June 20, 2018 11:59 PM. Always use your most recent med list.        amitriptyline 25 MG tablet Commonly known as:  ELAVIL Take 1 tablet (25 mg total) by mouth at bedtime.   EX-LAX PO Take 2 tablets by mouth every 3 (three) days.   fluticasone 50 MCG/ACT nasal spray Commonly known as:  FLONASE Place 2 sprays into both nostrils daily.   gabapentin 300 MG capsule Commonly known as:  NEURONTIN TAKE 2 CAPSULES AT BEDTIME   lisinopril-hydrochlorothiazide 10-12.5 MG tablet Commonly known as:  PRINZIDE,ZESTORETIC Take 1 tablet by mouth daily.   meclizine 25 MG tablet Commonly known  as:  ANTIVERT Take 1 tablet (25 mg total) by mouth 3 (three) times daily as needed for dizziness.   methocarbamol 500 MG tablet Commonly known as:  ROBAXIN Take 500 mg by mouth 2 (two) times daily.   nitrofurantoin 50 MG capsule Commonly known as:  MACRODANTIN Take 1 capsule (50 mg total) by mouth at bedtime. Begin after completing current course of antibiotics.   nystatin ointment Commonly known as:  MYCOSTATIN Apply 1 application topically 2 (two) times daily.   oxyCODONE-acetaminophen 5-325 MG tablet Commonly known as:  PERCOCET/ROXICET Take 1 tablet by mouth every 6 (six) hours as needed for  moderate pain.          Objective:    BP 117/67   Pulse (!) 56   Temp (!) 97.3 F (36.3 C) (Oral)   Ht 5' (1.524 m)   Wt 189 lb 6.4 oz (85.9 kg)   BMI 36.99 kg/m   Allergies  Allergen Reactions  . Cashew Nut Oil Hives  . Dog Epithelium Hives and Itching  . Ondansetron Hcl Nausea And Vomiting    "made me throw up"  . Sulfa Antibiotics     "blistering lips and mouth sores"    Wt Readings from Last 3 Encounters:  06/20/18 189 lb 6.4 oz (85.9 kg)  04/26/18 189 lb (85.7 kg)  03/26/18 185 lb (83.9 kg)    Physical Exam Constitutional:      Appearance: She is well-developed.  HENT:     Head: Normocephalic and atraumatic.     Right Ear: Tympanic membrane, ear canal and external ear normal.     Left Ear: Tympanic membrane, ear canal and external ear normal.     Nose: Nose normal. No rhinorrhea.     Mouth/Throat:     Pharynx: No oropharyngeal exudate or posterior oropharyngeal erythema.  Eyes:     Conjunctiva/sclera: Conjunctivae normal.     Pupils: Pupils are equal, round, and reactive to light.  Neck:     Musculoskeletal: Normal range of motion and neck supple.  Cardiovascular:     Rate and Rhythm: Normal rate and regular rhythm.     Heart sounds: Normal heart sounds.  Pulmonary:     Effort: Pulmonary effort is normal.     Breath sounds: Normal breath sounds.  Abdominal:     General: Bowel sounds are normal.     Palpations: Abdomen is soft.  Skin:    General: Skin is warm and dry.     Findings: No rash.  Neurological:     Mental Status: She is alert and oriented to person, place, and time.     Deep Tendon Reflexes: Reflexes are normal and symmetric.  Psychiatric:        Behavior: Behavior normal.        Thought Content: Thought content normal.        Judgment: Judgment normal.     Results for orders placed or performed in visit on 06/20/18  CBC with Differential/Platelet  Result Value Ref Range   WBC 7.2 3.4 - 10.8 x10E3/uL   RBC 3.98 3.77 - 5.28  x10E6/uL   Hemoglobin 12.8 11.1 - 15.9 g/dL   Hematocrit 36.9 34.0 - 46.6 %   MCV 93 79 - 97 fL   MCH 32.2 26.6 - 33.0 pg   MCHC 34.7 31.5 - 35.7 g/dL   RDW 12.5 11.7 - 15.4 %   Platelets 310 150 - 450 x10E3/uL   Neutrophils 43 Not Estab. %   Lymphs 48  Not Estab. %   Monocytes 7 Not Estab. %   Eos 2 Not Estab. %   Basos 0 Not Estab. %   Neutrophils Absolute 3.1 1.4 - 7.0 x10E3/uL   Lymphocytes Absolute 3.4 (H) 0.7 - 3.1 x10E3/uL   Monocytes Absolute 0.5 0.1 - 0.9 x10E3/uL   EOS (ABSOLUTE) 0.2 0.0 - 0.4 x10E3/uL   Basophils Absolute 0.0 0.0 - 0.2 x10E3/uL   Immature Granulocytes 0 Not Estab. %   Immature Grans (Abs) 0.0 0.0 - 0.1 x10E3/uL  CMP14+EGFR  Result Value Ref Range   Glucose 82 65 - 99 mg/dL   BUN 15 8 - 27 mg/dL   Creatinine, Ser 0.88 0.57 - 1.00 mg/dL   GFR calc non Af Amer 65 >59 mL/min/1.73   GFR calc Af Amer 75 >59 mL/min/1.73   BUN/Creatinine Ratio 17 12 - 28   Sodium 140 134 - 144 mmol/L   Potassium 4.7 3.5 - 5.2 mmol/L   Chloride 104 96 - 106 mmol/L   CO2 21 20 - 29 mmol/L   Calcium 9.0 8.7 - 10.3 mg/dL   Total Protein 6.7 6.0 - 8.5 g/dL   Albumin 4.2 3.7 - 4.7 g/dL   Globulin, Total 2.5 1.5 - 4.5 g/dL   Albumin/Globulin Ratio 1.7 1.2 - 2.2   Bilirubin Total 0.4 0.0 - 1.2 mg/dL   Alkaline Phosphatase 60 39 - 117 IU/L   AST 18 0 - 40 IU/L   ALT 21 0 - 32 IU/L  Lipid panel  Result Value Ref Range   Cholesterol, Total 190 100 - 199 mg/dL   Triglycerides 128 0 - 149 mg/dL   HDL 71 >39 mg/dL   VLDL Cholesterol Cal 26 5 - 40 mg/dL   LDL Calculated 93 0 - 99 mg/dL   Chol/HDL Ratio 2.7 0.0 - 4.4 ratio  TSH  Result Value Ref Range   TSH 1.260 0.450 - 4.500 uIU/mL  Bayer DCA Hb A1c Waived  Result Value Ref Range   HB A1C (BAYER DCA - WAIVED) 5.8 <7.0 %      Assessment & Plan:   1. Chronic midline low back pain with sciatica, sciatica laterality unspecified - DG Lumbar Spine 2-3 Views; Future - Ambulatory referral to Physical Therapy  2. Chronic  midline thoracic back pain - DG Thoracic Spine 2 View; Future - Ambulatory referral to Physical Therapy  3. Well adult exam - CBC with Differential/Platelet - CMP14+EGFR - Lipid panel - TSH - Bayer DCA Hb A1c Waived   Continue all other maintenance medications as listed above.  Follow up plan: No follow-ups on file.  Educational handout given for [place] [employer] [place] PA-C [place] [address]  [place], [place] [address] [phone]   06/24/2018, 8:44 PM

## 2018-06-26 ENCOUNTER — Ambulatory Visit: Payer: Medicare HMO | Admitting: Physical Therapy

## 2018-06-29 ENCOUNTER — Encounter: Payer: Self-pay | Admitting: Gastroenterology

## 2018-06-29 ENCOUNTER — Ambulatory Visit: Payer: Medicare HMO | Admitting: Physical Therapy

## 2018-07-05 ENCOUNTER — Other Ambulatory Visit: Payer: Self-pay

## 2018-07-05 ENCOUNTER — Ambulatory Visit: Payer: Medicare HMO | Attending: Physician Assistant | Admitting: Physical Therapy

## 2018-07-05 DIAGNOSIS — M545 Low back pain, unspecified: Secondary | ICD-10-CM

## 2018-07-05 DIAGNOSIS — G8929 Other chronic pain: Secondary | ICD-10-CM | POA: Insufficient documentation

## 2018-07-05 DIAGNOSIS — R293 Abnormal posture: Secondary | ICD-10-CM | POA: Diagnosis not present

## 2018-07-05 NOTE — Therapy (Signed)
[facility] [place], [place], [address] Phone: [phone]   Fax:  [phone]  Physical Therapy Evaluation  Patient Details  Name: [name] MRN: [id number] Date of Birth: [birth year] Referring Provider (PT): Particia Nearing   Encounter Date: 07/05/2018  PT End of Session - 07/05/18 1759    Visit Number  1    Number of Visits  12    Date for PT Re-Evaluation  10/03/18    PT Start Time  0945    PT Stop Time  1041    PT Time Calculation (min)  56 min    Activity Tolerance  Patient tolerated treatment well    Behavior During Therapy  [facility] for tasks assessed/performed       Past Medical History:  Diagnosis Date  . Arthritis   . Colon polyp, hyperplastic 04/27/2012   Colonoscopy 2010.  No adenomatous polyps   . Endometrioid adenocarcinoma   . History of endometrial cancer s/p TAH BSO June2013 06/15/2011   Grade 1 endometrioid adenocarcinoma with focal superficial myometrial invasion of 0.1 cm with a myometrium of 1.8 cm or approximately 7%. She had a 5-cm tumor. No lymphovascular space Involvement.    . Hyperlipidemia   . Hypertension   . Mass of in fold of jejunum s/p SB resection 07/19/2012 07/20/2012  . Obesity (BMI 30-39.9) 04/27/2012  . Pyelonephritis 02/2016  . 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 SBO (small bowel obstruction) - recurrent 04/27/2012  . Sepsis due to Escherichia coli (E. coli) ([place])   . Ureteral adhesion, left 2010   s/p ureterolysis & stenting Dr. Larwance Sachs now  . UTI (urinary tract infection)     Past Surgical History:  Procedure Laterality Date  . ABDOMINAL HYSTERECTOMY  11/02/10   TAH, BSO, lysis of adhesions for endometrial cancer  . [place], 2009   anterior L2-3, L3-4 arthrodesis  . BACK SURGERY  12/17/2012   Spinal fusion and repair   . BLADDER SUSPENSION  2003  . CATARACT EXTRACTION, BILATERAL  2003  .  CYSTOSCOPY     left ureteral sttent  exchage  . CYSTOSCOPY W/ URETERAL STENT PLACEMENT Left 12/07/2017   Procedure: CYSTOSCOPY WITH RETROGRADE PYELOGRAM/URETERAL STENT PLACEMENT;  Surgeon: Raynelle Bring, MD;  [facility];  Service: Urology;  Laterality: Left;  . CYSTOSCOPY W/ URETERAL STENT PLACEMENT Left 03/29/2018   Procedure: CYSTOSCOPY WITH STENT  EXCHANGE;  Surgeon: Raynelle Bring, MD;  [facility];  Service: Urology;  Laterality: Left;  . EYE SURGERY    . FOOT SURGERY  06/2010   left foot surgery  . HERNIA REPAIR  07/19/12   lap [place] repair  . INCISIONAL HERNIA REPAIR  11/02/10  . INSERTION OF MESH N/A 04/23/2013   Procedure: INSERTION OF MESH;  Surgeon: Adin Hector, MD;  [facility];  Service: General;  Laterality: N/A;  . JOINT REPLACEMENT  09/2009   Right total knee  . KIDNEY SURGERY  2010   Left kidney  . LAPAROSCOPIC LYSIS OF ADHESIONS N/A 04/23/2013   Procedure: LAPAROSCOPIC LYSIS OF ADHESIONS;  Surgeon: Adin Hector, MD;  [facility];  Service: General;  Laterality: N/A;  . TOTAL HIP ARTHROPLASTY  01/2010   Right total hip  . TOTAL HIP ARTHROPLASTY Left 07/04/2014   Procedure: LEFT TOTAL HIP ARTHROPLASTY ANTERIOR APPROACH;  Surgeon: Gearlean Alf, MD;  Location: [facility];  Service: Orthopedics;  Laterality: Left;  .  TOTAL KNEE ARTHROPLASTY     right  . URETEROLYSIS  2010   lap/open left ureterolysis & omental flap, ureter stent  . VENTRAL HERNIA REPAIR N/A 07/19/2012   Procedure: LAPAROSCOPIC LYSIS OF ADHESIONS, SMALL BOWEL RESECTION, SEROSAL REPAIR, PRIMARY VENTRAL HERNIA REPAIR;  Surgeon: Adin Hector, MD;  [facility];  Service: General;  Laterality: N/A;  . VENTRAL HERNIA REPAIR N/A 04/23/2013   Procedure: LAPAROSCOPIC exploration and repair of hernia in abdominal VENTRAL wall  HERNIA;  Surgeon: Adin Hector, MD;  [facility];  Service: General;  Laterality: N/A;    There were no vitals filed for this visit.   Subjective  Assessment - 07/05/18 1809    Subjective  The patient presents to OPPT with c/o low back pain.  Her reported pain-level is a 6/10 but she states that after standing for awhile her pain will rise to higher levels.  She reports that due to her spinal problems her right shoulder drops and shirts tend to fall off her shoulder.  Rest decreases her pain.  Her pain is a 10/10 when first out of bed and she reports it takes a couple of hours for her pain to come down.    Pertinent History  OA, 4 lumbar surgeries, bilateral THA's (right posterior approach, left anterior approach), bilateral foor surgeries, hernia surgery, right CTS, right TKA, chronic left LE edema.    How long can you stand comfortably?  10 minutes.    How long can you walk comfortably?  10 minutes.    Patient Stated Goals  Improve posture.    Currently in Pain?  Yes    Pain Score  6     Pain Location  Back    Pain Orientation  Mid    Pain Descriptors / Indicators  Aching    Pain Type  Chronic pain    Pain Onset  More than a month ago    Pain Frequency  Constant    Aggravating Factors   See above.    Pain Relieving Factors  See above.         [facility] PT Assessment - 07/05/18 0001      Assessment   Medical Diagnosis  Chronic midline low back pain with sciatica.    Referring Provider (PT)  Particia Nearing    Onset Date/Surgical Date  --   Ongoing.     Precautions   Precautions  Fall      Restrictions   Weight Bearing Restrictions  No      Balance Screen   Has the patient fallen in the past 6 months  Yes    How many times?  --   3.   Has the patient had a decrease in activity level because of a fear of falling?   No    Is the patient reluctant to leave their home because of a fear of falling?   No      Home Environment   Living Environment  Private residence      Prior Function   Level of Independence  Independent      Posture/Postural Control   Posture/Postural Control  Postural limitations    Postural Limitations   Rounded Shoulders;Forward head;Decreased lumbar lordosis;Flexed trunk    Posture Comments  Right shoulder depression.  Scoliosis.      ROM / Strength   AROM / PROM / Strength  AROM;Strength      AROM   Overall AROM Comments  Functional LE AROM.  Strength   Overall Strength Comments  Right hip abduction= 4-/5, left hip abduction= 4-/5, bilateral knee and ankle strength= 5/5.      Palpation   Palpation comment  C/o pain at L4-S1.      Special Tests   Other special tests  Unable to elicit LE DTR's.  Equal leg lengths.        Ambulation/Gait   Gait Comments  Patient walking with a cane with a very notable Trendelenburg gait pattern.                Objective measurements completed on examination: See above findings.      [facility] Adult PT Treatment/Exercise - 07/05/18 0001      Modalities   Modalities  Electrical Stimulation;Moist Heat      Moist Heat Therapy   Number Minutes Moist Heat  20 Minutes    Moist Heat Location  Lumbar Spine      Electrical Stimulation   Electrical Stimulation Location  Lower lumbar     Electrical Stimulation Action  IFC    Electrical Stimulation Parameters  80-150 Hz x 20 minutes.    Electrical Stimulation Goals  Pain               PT Short Term Goals - 07/05/18 1835      PT SHORT TERM GOAL #1   Title  STG's=LTG's.        PT Long Term Goals - 07/05/18 1835      PT LONG TERM GOAL #1   Title  Independent with HEP.    Time  6    Period  Weeks    Status  New      PT LONG TERM GOAL #2   Title  Improve posture so that shoulders are equal.    Time  6    Period  Weeks    Status  New      PT LONG TERM GOAL #3   Title  Decrease average daily pain-level by 2 points.    Period  Weeks    Status  New             Plan - 07/05/18 1828    Clinical Impression Statement  The patient presents to the clinic today with c/o low back pain and postural abnormalities such that shirts fall off her right shoulder.  She has  bilateral hip weakness and a notable Trendelenburg gait pattern.  She is in severe pain upon waking every morning.  She has multiple co-morbidities.  Patient will benefit from skilled physical therapy intervention to address deficits and pain.    History and Personal Factors relevant to plan of care:  OA, 4 lumbar surgeries, bilateral THA's (right posterior approach, left anterior approach), bilateral foor surgeries, hernia surgery, right CTS, right TKA, chronic left LE edema.    Clinical Presentation  Evolving    Clinical Presentation due to:  Not improving.    Clinical Decision Making  Moderate    Rehab Potential  Good    PT Frequency  2x / week    PT Duration  6 weeks    PT Treatment/Interventions  ADLs/Self Care Home Management;Cryotherapy;Electrical Stimulation;Ultrasound;Moist Heat;Therapeutic activities;Therapeutic exercise;Patient/family education;Manual techniques    PT Next Visit Plan  Please focus on strengthening of bilateral hips, spinal exercises, postural exercises.  Moadlites PRN.    Consulted and Agree with Plan of Care  Patient       Patient will benefit from skilled therapeutic intervention in order to improve  the following deficits and impairments:  Abnormal gait, Decreased activity tolerance, Decreased strength, Postural dysfunction  Visit Diagnosis: Chronic bilateral low back pain, unspecified whether sciatica present - Plan: PT plan of care cert/re-cert  Abnormal posture - Plan: PT plan of care cert/re-cert     Problem List Patient Active Problem List   Diagnosis Date Noted  . Hydronephrosis   . Pyelonephritis 08/01/2017  . Class 2 severe obesity with serious comorbidity and body mass index (BMI) of 35.0 to 35.9 in adult ([place]) 06/21/2017  . History of hydronephrosis   . Low back pain   . Abdominal pain 04/26/2016  . Left flank pain   . Intractable abdominal pain 04/25/2016  . Recurrent UTI 02/20/2016  . Peripheral neuropathy 02/08/2016  . Depression  02/08/2016  . OA (osteoarthritis) of hip 07/04/2014  . 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 Pseudoarthrosis L3- L5 12/17/2012  . Nocturnal leg cramps 11/13/2012  . HLD (hyperlipidemia) 08/10/2012  . Recurrent ventral incisional hernia s/p lap repair 04/23/2013 04/27/2012  . Obesity (BMI 30-39.9) 04/27/2012  . Chronic midline thoracic back pain 04/27/2012  . Hypertension   . History of endometrial cancer s/p TAH BSO June2013 06/15/2011    Bleu Minerd, [place] MPT 07/05/2018, 6:39 PM  [facility] [address] [place], [place], [address] Phone: [phone]   Fax:  [phone]  Name: [name] MRN: [id number] Date of Birth: 1946/04/16

## 2018-07-12 ENCOUNTER — Ambulatory Visit: Payer: Medicare HMO | Attending: Physician Assistant | Admitting: *Deleted

## 2018-07-12 DIAGNOSIS — R293 Abnormal posture: Secondary | ICD-10-CM | POA: Diagnosis not present

## 2018-07-12 DIAGNOSIS — M545 Low back pain: Secondary | ICD-10-CM | POA: Diagnosis not present

## 2018-07-12 DIAGNOSIS — G8929 Other chronic pain: Secondary | ICD-10-CM | POA: Diagnosis not present

## 2018-07-12 NOTE — Therapy (Signed)
[facility] [place], [place], [address] Phone: [phone]   Fax:  [phone]  Physical Therapy Treatment  Patient Details  Name: [name] MRN: [id number] Date of Birth: September 05, 1945 Referring Provider (PT): Particia Nearing   Encounter Date: 07/12/2018  PT End of Session - 07/12/18 1310    Visit Number  2    Number of Visits  12    Date for PT Re-Evaluation  10/03/18    PT Start Time  1300    PT Stop Time  1350    PT Time Calculation (min)  50 min       Past Medical History:  Diagnosis Date  . Arthritis   . Colon polyp, hyperplastic 04/27/2012   Colonoscopy 2010.  No adenomatous polyps   . Endometrioid adenocarcinoma   . History of endometrial cancer s/p TAH BSO June2013 06/15/2011   Grade 1 endometrioid adenocarcinoma with focal superficial myometrial invasion of 0.1 cm with a myometrium of 1.8 cm or approximately 7%. She had a 5-cm tumor. No lymphovascular space Involvement.    . Hyperlipidemia   . Hypertension   . Mass of in fold of jejunum s/p SB resection 07/19/2012 07/20/2012  . Obesity (BMI 30-39.9) 04/27/2012  . Pyelonephritis 02/2016  . 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 SBO (small bowel obstruction) - recurrent 04/27/2012  . Sepsis due to Escherichia coli (E. coli) ([place])   . Ureteral adhesion, left 2010   s/p ureterolysis & stenting Dr. Larwance Sachs now  . UTI (urinary tract infection)     Past Surgical History:  Procedure Laterality Date  . ABDOMINAL HYSTERECTOMY  11/02/10   TAH, BSO, lysis of adhesions for endometrial cancer  . [place], 2009   anterior L2-3, L3-4 arthrodesis  . BACK SURGERY  12/17/2012   Spinal fusion and repair   . BLADDER SUSPENSION  2003  . CATARACT EXTRACTION, BILATERAL  2003  . CYSTOSCOPY     left ureteral sttent  exchage  . CYSTOSCOPY W/ URETERAL STENT PLACEMENT Left 12/07/2017   Procedure:  CYSTOSCOPY WITH RETROGRADE PYELOGRAM/URETERAL STENT PLACEMENT;  Surgeon: Raynelle Bring, MD;  [facility];  Service: Urology;  Laterality: Left;  . CYSTOSCOPY W/ URETERAL STENT PLACEMENT Left 03/29/2018   Procedure: CYSTOSCOPY WITH STENT  EXCHANGE;  Surgeon: Raynelle Bring, MD;  [facility];  Service: Urology;  Laterality: Left;  . EYE SURGERY    . FOOT SURGERY  06/2010   left foot surgery  . HERNIA REPAIR  07/19/12   lap [place] repair  . INCISIONAL HERNIA REPAIR  11/02/10  . INSERTION OF MESH N/A 04/23/2013   Procedure: INSERTION OF MESH;  Surgeon: Adin Hector, MD;  [facility];  Service: General;  Laterality: N/A;  . JOINT REPLACEMENT  09/2009   Right total knee  . KIDNEY SURGERY  2010   Left kidney  . LAPAROSCOPIC LYSIS OF ADHESIONS N/A 04/23/2013   Procedure: LAPAROSCOPIC LYSIS OF ADHESIONS;  Surgeon: Adin Hector, MD;  [facility];  Service: General;  Laterality: N/A;  . TOTAL HIP ARTHROPLASTY  01/2010   Right total hip  . TOTAL HIP ARTHROPLASTY Left 07/04/2014   Procedure: LEFT TOTAL HIP ARTHROPLASTY ANTERIOR APPROACH;  Surgeon: Gearlean Alf, MD;  Location: [facility];  Service: Orthopedics;  Laterality: Left;  . TOTAL KNEE ARTHROPLASTY     right  . URETEROLYSIS  2010   lap/open left ureterolysis & omental flap,  ureter stent  . VENTRAL HERNIA REPAIR N/A 07/19/2012   Procedure: LAPAROSCOPIC LYSIS OF ADHESIONS, SMALL BOWEL RESECTION, SEROSAL REPAIR, PRIMARY VENTRAL HERNIA REPAIR;  Surgeon: Adin Hector, MD;  [facility];  Service: General;  Laterality: N/A;  . VENTRAL HERNIA REPAIR N/A 04/23/2013   Procedure: LAPAROSCOPIC exploration and repair of hernia in abdominal VENTRAL wall  HERNIA;  Surgeon: Adin Hector, MD;  [facility];  Service: General;  Laterality: N/A;    There were no vitals filed for this visit.  Subjective Assessment - 07/12/18 1307    Subjective  No Nustep or bike    Pertinent History  OA, 4 lumbar surgeries, bilateral THA's  (right posterior approach, left anterior approach), bilateral foor surgeries, hernia surgery, right CTS, right TKA, chronic left LE edema.    How long can you stand comfortably?  10 minutes.    How long can you walk comfortably?  10 minutes.    Patient Stated Goals  Improve posture.    Currently in Pain?  Yes    Pain Score  6     Pain Location  Back    Pain Orientation  Medial    Pain Descriptors / Indicators  Aching    Pain Type  Chronic pain    Pain Onset  More than a month ago    Pain Frequency  Constant                       [facility] Adult PT Treatment/Exercise - 07/12/18 0001      Exercises   Exercises  Knee/Hip;Lumbar      Knee/Hip Exercises: Seated   Long Arc Quad  Strengthening;Both;3 sets    [employer] Weight  2 lbs.    Ball Squeeze  x30 hold 3 secs    Clamshell with TheraBand  Red    Sit to Sand  3 sets;10 reps;without UE support   on plinth     Knee/Hip Exercises: Supine   Bridges  AROM;2 sets;5 reps    Straight Leg Raises  AROM;2 sets;10 reps    Other Supine Knee/Hip Exercises  Reviewed ball squeeze and clamshell with band in supine as well as sitting      Knee/Hip Exercises: Sidelying   Hip ABduction  Both;AAROM;3 sets;10 reps;AROM   AAROM on RT LE     Modalities   Modalities  --      Moist Heat Therapy   Number Minutes Moist Heat  --    Moist Heat Location  --      Electrical Stimulation   Electrical Stimulation Location  --    Printmaker Goals  --             PT Education - 07/12/18 1358    Education Details  core and hip exs    Person(s) Educated  Patient    Methods  Explanation;Demonstration;Tactile cues;Verbal cues;Handout    Comprehension  Verbalized understanding;Need further instruction;Returned demonstration       PT Short Term Goals - 07/05/18 1835      PT SHORT TERM GOAL #1   Title  STG's=LTG's.        PT Long Term Goals - 07/05/18 1835      PT LONG TERM GOAL #1   Title  Independent with HEP.     Time  6    Period  Weeks    Status  New      PT LONG TERM GOAL #2  Title  Improve posture so that shoulders are equal.    Time  6    Period  Weeks    Status  New      PT LONG TERM GOAL #3   Title  Decrease average daily pain-level by 2 points.    Period  Weeks    Status  New            Plan - 07/12/18 1306    Clinical Impression Statement  Pt arrived today doing fairly well and reports that both of her legs are weak and doesn't like the nustep machine. Pt was able to perform LE exs in sitting and on mat and did well . Her cc is weak hips. She did very well with sit to stand without UE assist. HEP given, but needs reviewed    Clinical Decision Making  Moderate    Rehab Potential  Good    PT Frequency  2x / week    PT Duration  6 weeks    PT Treatment/Interventions  ADLs/Self Care Home Management;Cryotherapy;Electrical Stimulation;Ultrasound;Moist Heat;Therapeutic activities;Therapeutic exercise;Patient/family education;Manual techniques    PT Next Visit Plan  Please focus on strengthening of bilateral hips, spinal exercises, postural exercises.  Moadlites PRN.   No Nustep as per Pt       Patient will benefit from skilled therapeutic intervention in order to improve the following deficits and impairments:  Abnormal gait, Decreased activity tolerance, Decreased strength, Postural dysfunction  Visit Diagnosis: Chronic bilateral low back pain, unspecified whether sciatica present  Abnormal posture     Problem List Patient Active Problem List   Diagnosis Date Noted  . Hydronephrosis   . Pyelonephritis 08/01/2017  . Class 2 severe obesity with serious comorbidity and body mass index (BMI) of 35.0 to 35.9 in adult ([place]) 06/21/2017  . History of hydronephrosis   . Low back pain   . Abdominal pain 04/26/2016  . Left flank pain   . Intractable abdominal pain 04/25/2016  . Recurrent UTI 02/20/2016  . Peripheral neuropathy 02/08/2016  . Depression 02/08/2016  . OA  (osteoarthritis) of hip 07/04/2014  . 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 Pseudoarthrosis L3- L5 12/17/2012  . Nocturnal leg cramps 11/13/2012  . HLD (hyperlipidemia) 08/10/2012  . Recurrent ventral incisional hernia s/p lap repair 04/23/2013 04/27/2012  . Obesity (BMI 30-39.9) 04/27/2012  . Chronic midline thoracic back pain 04/27/2012  . Hypertension   . History of endometrial cancer s/p TAH BSO June2013 06/15/2011    RAMSEUR,CHRIS, PTA 07/12/2018, 2:02 PM  [facility] [place], [place], [address] Phone: [phone]   Fax:  [phone]  Name: LORRENA GORANSON MRN: [id number] Date of Birth: 08-Oct-1945

## 2018-07-19 ENCOUNTER — Encounter (HOSPITAL_COMMUNITY): Payer: Self-pay

## 2018-07-19 ENCOUNTER — Encounter (HOSPITAL_COMMUNITY)
Admission: RE | Admit: 2018-07-19 | Discharge: 2018-07-19 | Disposition: A | Discharge status: Home / Self Care | Payer: Medicare HMO | Source: Ambulatory Visit | Attending: Urology | Admitting: Urology

## 2018-07-19 ENCOUNTER — Other Ambulatory Visit: Payer: Self-pay

## 2018-07-19 DIAGNOSIS — Z01812 Encounter for preprocedural laboratory examination: Secondary | ICD-10-CM | POA: Insufficient documentation

## 2018-07-19 HISTORY — DX: Lumbago with sciatica, unspecified side: M54.40

## 2018-07-19 HISTORY — DX: Complete loss of teeth, unspecified cause, unspecified class: K08.109

## 2018-07-19 HISTORY — DX: Unspecified hearing loss, unspecified ear: H91.90

## 2018-07-19 HISTORY — DX: Personal history of urinary (tract) infections: Z87.440

## 2018-07-19 HISTORY — DX: Dizziness and giddiness: R42

## 2018-07-19 HISTORY — DX: Personal history of other diseases of the digestive system: Z87.19

## 2018-07-19 HISTORY — DX: Other chronic pain: G89.29

## 2018-07-19 HISTORY — DX: Presence of dental prosthetic device (complete) (partial): Z97.2

## 2018-07-19 HISTORY — DX: Personal history of other infectious and parasitic diseases: Z86.19

## 2018-07-19 HISTORY — DX: Presence of spectacles and contact lenses: Z97.3

## 2018-07-19 HISTORY — DX: Other constipation: K59.09

## 2018-07-19 HISTORY — DX: Crossing vessel and stricture of ureter without hydronephrosis: N13.5

## 2018-07-19 LAB — CBC
HCT: 44.2 % (ref 36.0–46.0)
HEMOGLOBIN: 14.2 g/dL (ref 12.0–15.0)
MCH: 31.5 pg (ref 26.0–34.0)
MCHC: 32.1 g/dL (ref 30.0–36.0)
MCV: 98 fL (ref 80.0–100.0)
Platelets: 319 10*3/uL (ref 150–400)
RBC: 4.51 MIL/uL (ref 3.87–5.11)
RDW: 11.9 % (ref 11.5–15.5)
WBC: 7.9 10*3/uL (ref 4.0–10.5)
nRBC: 0 % (ref 0.0–0.2)

## 2018-07-19 LAB — BASIC METABOLIC PANEL
Anion gap: 9 (ref 5–15)
BUN: 12 mg/dL (ref 8–23)
CHLORIDE: 105 mmol/L (ref 98–111)
CO2: 25 mmol/L (ref 22–32)
Calcium: 9.2 mg/dL (ref 8.9–10.3)
Creatinine, Ser: 0.84 mg/dL (ref 0.44–1.00)
GFR calc Af Amer: >60 mL/min (ref >60)
GFR calc non Af Amer: >60 mL/min (ref >60)
Glucose, Bld: 113 mg/dL — ABNORMAL HIGH (ref 70–99)
Potassium: 3.8 mmol/L (ref 3.5–5.1)
Sodium: 139 mmol/L (ref 135–145)

## 2018-07-19 NOTE — Patient Instructions (Signed)
[name]  07/19/2018       Your procedure is scheduled on:  07-27-2018   Report to [facility] Main  Entrance,  Report to admitting at  1:30 PM    Call this number if you have problems the morning of surgery [phone]         Remember: Do not eat food After Midnight.   Clear liquid diet from midnight until 9:30 AM day of surgery.  Nothing by mouth after 9:30 AM including water, candy, gum, mints.  BRUSH YOUR TEETH MORNING OF SURGERY AND RINSE YOUR MOUTH OUT         Take these medicines the morning of surgery with A SIP OF WATER:   NONE                                   You may not have any metal on your body including hair pins and piercings              Do not wear jewelry, make-up, lotions, powders or perfumes, deodorant              Do not wear nail polish.  Do not shave  48 hours prior to surgery.     .   Do not bring valuables to the hospital. [place].  Contacts, dentures or bridgework may not be worn into surgery.  Leave suitcase in the car. After surgery it may be brought to your room.        Patients discharged the day of surgery will not be allowed to drive home. IF YOU ARE HAVING SURGERY AND GOING HOME THE SAME DAY, YOU MUST HAVE AN ADULT TO DRIVE YOU HOME AND BE WITH YOU FOR 24 HOURS. YOU MAY GO HOME BY TAXI OR UBER OR ORTHERWISE, BUT AN ADULT MUST ACCOMPANY YOU HOME AND STAY WITH YOU FOR 24 HOURS.   Name and phone number of your driver:            _____________________________________________________________________     CLEAR LIQUID DIET   Foods Allowed                                                                     Foods Excluded  Coffee and tea, regular and decaf                             liquids that you cannot  Plain Jell-O in any flavor                                             see through such as: Fruit ices (not with fruit pulp)  milk, soups, orange juice  Iced Popsicles                                    All solid food Carbonated beverages, regular and diet                                    Cranberry, grape and apple juices Sports drinks like Gatorade Lightly seasoned clear broth or consume(fat free) Sugar, honey syrup   _____________________________________________________________________             [facility] - Preparing for Surgery Before surgery, you can play an important role.  Because skin is not sterile, your skin needs to be as free of germs as possible.  You can reduce the number of germs on your skin by washing with CHG (chlorahexidine gluconate) soap before surgery.  CHG is an antiseptic cleaner which kills germs and bonds with the skin to continue killing germs even after washing. Please DO NOT use if you have an allergy to CHG or antibacterial soaps.  If your skin becomes reddened/irritated stop using the CHG and inform your nurse when you arrive at Short Stay. Do not shave (including legs and underarms) for at least 48 hours prior to the first CHG shower.  You may shave your face/neck. Please follow these instructions carefully:  1.  Shower with CHG Soap the night before surgery and the  morning of Surgery.  2.  If you choose to wash your hair, wash your hair first as usual with your  normal  shampoo.  3.  After you shampoo, rinse your hair and body thoroughly to remove the  shampoo.                            4.  Use CHG as you would any other liquid soap.  You can apply chg directly  to the skin and wash                       Gently with a scrungie or clean washcloth.  5.  Apply the CHG Soap to your body ONLY FROM THE NECK DOWN.   Do not use on face/ open                           Wound or open sores. Avoid contact with eyes, ears mouth and genitals (private parts).                       Wash face,  Genitals (private parts) with your normal soap.             6.  Wash  thoroughly, paying special attention to the area where your surgery  will be performed.  7.  Thoroughly rinse your body with warm water from the neck down.  8.  DO NOT shower/wash with your normal soap after using and rinsing off  the CHG Soap.             9.  Pat yourself dry with a clean towel.            10.  Wear clean pajamas.            11.  Place clean sheets  on your bed the night of your first shower and do not  sleep with pets. Day of Surgery : Do not apply any lotions/deodorants the morning of surgery.  Please wear clean clothes to the hospital/surgery center.  FAILURE TO FOLLOW THESE INSTRUCTIONS MAY RESULT IN THE CANCELLATION OF YOUR SURGERY PATIENT SIGNATURE_________________________________  NURSE SIGNATURE__________________________________  ________________________________________________________________________

## 2018-07-24 ENCOUNTER — Encounter (HOSPITAL_COMMUNITY): Payer: Self-pay

## 2018-07-24 NOTE — Progress Notes (Addendum)
EKG dated 11-28-2017 in epic.  CXR dated 10-21-2017 in epic.  ABd. CT dated 11-29-2017.

## 2018-07-25 NOTE — H&P (Signed)
CC/HPI: Left ureteral obstruction with history of recurrent pyelonephritis   She follows up today for continued management of her left ureteral obstruction due to stricture. She still has not had any clinical infections since she had her ureteral stent placed approximately 6 months ago. Her last stent was changed in mid November. She remains on trimethoprim prophylaxis. She otherwise denies any changes in her overall health.     ALLERGIES: Ondansetron HCl TABS Sulfa Drugs    MEDICATIONS: Trimethoprim 100 mg tablet 1 tablet PO Q HS  Allegra Allergy  Amitriptyline Hcl 25 mg tablet Oral  Lisinopril 20 MG Oral Tablet Oral  Neurontin 300 MG Oral Capsule Oral  Nitrofurantoin Mono-Macro 100 mg capsule  Uristat     GU PSH: Catheterization For Collection Of Specimen, Single Patient, All Places Of Service - 08/01/2017 Cysto Uretero Balloon Dil Strict - 2010 Cystoscopy Insert Stent, Left - 03/29/2018, Left - 12/07/2017, 2010, 2010 Omental Flap; Intra-abdom - 2010 Release Ureter - 2010 Sling - 2010      PSH Notes: Total Hip Replacement, Ureterolysis, Abdominal Surgery Omental Flap, Intra-abdominal, Cystourethroscopy W/ Ureteroscopy W/ Tx Of Ureteral Strict, Cystoscopy With Insertion Of Ureteral Stent Left, Mid-Urethral Sling Operation, Cystoscopy With Insertion Of Ureteral Stent Left, Back Surgery   NON-GU PSH: Total Hip Replacement - 2016    GU PMH: Flank Pain - 08/01/2017 Acute Cystitis/UTI - 03/01/2017 Chronic cystitis (w/o hematuria), Chronic cystitis - 2016 Ureteral stricture, Ureteral obstruction - 2016, Ureteral stricture, - 2014 Dysuria, Dysuria - 2016 Urinary Tract Inf, Unspec site, Pyuria - 2016, Urinary tract infection, - 2016 Urinary Urgency, Urinary urgency - 2016 Pyelonephritis, Pyelonephritis, acute - 2014      PMH Notes:   1) Left hydronephrosis and ureteral obstruction secondary to ureteral stricture: She is s/p a left anterior lumbar decompression and arthrodesis  in September 2009. This was performed via an anterior retroperitoneal approach. She presented to the emergency department in October 2009 with left lower quadrant pain, dysuria, and fever. A CT scan demonstrated a left lower quadrant fluid collection with left hydronephrosis. She was admitted to the hospital and was seen in consultation by Dr. Jeffie Pollock. A left percutaneous nephrostomy tube was placed. CT guided aspiration of the fluid collection was also performed and the fluid was consistent with a probable lymphocele. An antegrade stent was placed which was then removed in November 2009. She did well until May 2010 when she again presented with flank pain and fever and a renal ultrasound demonstrated left hydronephrosis. She was taken to the OR by Dr. Jeffie Pollock and a retrograde pyelogram showed a long proximal narrowing from the level of the left UPJ to the level of the bifurcation of the aorta. A stent was again placed which was removed in June 2010. I initially evaluated her in July 2010 and she was asymptomatic and she was therefore observed. However, she again developed left flank pain in August 2010 and was taken to the OR for further evaluation. She was found to have a short ureteral stricture at the level of L4 likely a result of an ischemic injury from her prior surgery. This was treated at that time with balloon dilation and stent placement. She again developed pyelonephritis and elected to proceed with definitive surgical repair. She underwent exploration on 03/11/09 and was found to have retroperitoneal fibrosis as the cause of her ureteral obstruction and likely related to her prior surgery. Her ureter was very difficult to remove from the surrounding fibrosis but she was able  to undergo definitive ureterolysis and omental wrap. She was noted to have declining relative renal function of her left kidney in March 2015 (25%). After discussing her options, the complexity of her surgical history, and the fact her  global renal function remained stable, she elected to proceed with observation rather than intervention.   Mar 2019: Left pyelonephritis - E. coli  Jun 2019: Left pyelonephritis - E. coli resistant to fluorquinolones, TMP/SMX, and tetracycline (sensitive to cephalexin)  Aug 2019: Left ureteral stent placement (initial) - 6 x 03 Apr 2018: Left ureteral stent - 6 x 24   2) Stress incontinence: She is s/p a urethral sling by Dr. Jeffie Pollock in 2005.   3) Recurrent UTIs: She has previously been on prophylaxis with nitrofurantoin but was able to discontinue this therapy in 2013. She developed recurrent pyelonephritis in 2017.       NON-GU PMH: Pyuria/other UA findings - 03/01/2017 Hypertension    FAMILY HISTORY: 1 Daughter - Daughter 2 sons - Son Death of family member - Sister, Father, Mother Diabetes - Sister, Son, Father, Mother End Stage Renal Disease - No Family History   SOCIAL HISTORY: Marital Status: Widowed Preferred Language: English; Ethnicity: Not Hispanic Or Latino; Race: White Current Smoking Status: Patient has never smoked.   Tobacco Use Assessment Completed: Used Tobacco in last 30 days? Does not drink anymore.  Drinks 3 caffeinated drinks per day.    REVIEW OF SYSTEMS:    GU Review Female:   Patient denies frequent urination, hard to postpone urination, burning /pain with urination, get up at night to urinate, leakage of urine, stream starts and stops, trouble starting your stream, have to strain to urinate, and currently pregnant.  Gastrointestinal (Lower):   Patient denies diarrhea and constipation.  Gastrointestinal (Upper):   Patient denies nausea and vomiting.  Constitutional:   Patient denies fever, night sweats, weight loss, and fatigue.  Skin:   Patient denies skin rash/ lesion and itching.  Eyes:   Patient denies blurred vision and double vision.  Ears/ Nose/ Throat:   Patient denies sore throat and sinus problems.  Hematologic/Lymphatic:   Patient denies  swollen glands and easy bruising.  Cardiovascular:   Patient denies leg swelling and chest pains.  Respiratory:   Patient denies cough and shortness of breath.  Endocrine:   Patient denies excessive thirst.  Musculoskeletal:   Patient reports back pain. Patient denies joint pain.  Neurological:   Patient reports headaches. Patient denies dizziness.  Psychologic:   Patient denies anxiety and depression.    MULTI-SYSTEM PHYSICAL EXAMINATION:    Constitutional: Well-nourished. No physical deformities. Normally developed. Good grooming.  Respiratory: No labored breathing, no use of accessory muscles. Clear bilaterally.  Cardiovascular: Normal temperature, normal extremity pulses, no swelling, no varicosities. Regular rate and rhythm.  Gastrointestinal: No CVA tenderness.      ASSESSMENT:      ICD-10 Details  1 GU:   Ureteral stricture - N13.5   2 NON-GU:   Pyuria/other UA findings - R82.79 Stable   PLAN:      1. Left ureteral obstruction: We have agreed to continue with stent management is this is been quite successful prevent in her from recurrent episodes of pyelonephritis. She will continue her trimethoprim prophylaxis through her next stent change and we will then discontinue that a few days after her stent change. We have reviewed this procedure in detail and plan to proceed with this in mid to late March which would be approximately 4 months. We  will continue to try and lengthened the interval between stent changes for her.   2. Recurrent pyelonephritis: Continue trimethoprim prophylaxis through her next stent change.   Cc: Dr. Redge Gainer

## 2018-07-26 ENCOUNTER — Ambulatory Visit (HOSPITAL_COMMUNITY): Payer: Medicare HMO | Admitting: Certified Registered"

## 2018-07-26 ENCOUNTER — Ambulatory Visit (HOSPITAL_COMMUNITY): Payer: Medicare HMO | Admitting: Physician Assistant

## 2018-07-26 ENCOUNTER — Encounter (HOSPITAL_COMMUNITY)
Admission: RE | Disposition: A | Discharge status: Home / Self Care | Payer: Self-pay | Source: Home / Self Care | Attending: Urology

## 2018-07-26 ENCOUNTER — Encounter (HOSPITAL_COMMUNITY): Payer: Self-pay | Admitting: Emergency Medicine

## 2018-07-26 ENCOUNTER — Ambulatory Visit (HOSPITAL_COMMUNITY): Payer: Medicare HMO

## 2018-07-26 ENCOUNTER — Ambulatory Visit (HOSPITAL_COMMUNITY)
Admission: RE | Admit: 2018-07-26 | Discharge: 2018-07-26 | Disposition: A | Discharge status: Home / Self Care | Payer: Medicare HMO | Attending: Urology | Admitting: Urology

## 2018-07-26 ENCOUNTER — Other Ambulatory Visit: Payer: Self-pay

## 2018-07-26 DIAGNOSIS — N393 Stress incontinence (female) (male): Secondary | ICD-10-CM | POA: Diagnosis not present

## 2018-07-26 DIAGNOSIS — Z96649 Presence of unspecified artificial hip joint: Secondary | ICD-10-CM | POA: Insufficient documentation

## 2018-07-26 DIAGNOSIS — N131 Hydronephrosis with ureteral stricture, not elsewhere classified: Secondary | ICD-10-CM | POA: Insufficient documentation

## 2018-07-26 DIAGNOSIS — Z8542 Personal history of malignant neoplasm of other parts of uterus: Secondary | ICD-10-CM | POA: Diagnosis not present

## 2018-07-26 DIAGNOSIS — Z466 Encounter for fitting and adjustment of urinary device: Secondary | ICD-10-CM | POA: Insufficient documentation

## 2018-07-26 DIAGNOSIS — Z6836 Body mass index (BMI) 36.0-36.9, adult: Secondary | ICD-10-CM | POA: Diagnosis not present

## 2018-07-26 DIAGNOSIS — Z79899 Other long term (current) drug therapy: Secondary | ICD-10-CM | POA: Diagnosis not present

## 2018-07-26 DIAGNOSIS — Z87440 Personal history of urinary (tract) infections: Secondary | ICD-10-CM | POA: Diagnosis not present

## 2018-07-26 DIAGNOSIS — N135 Crossing vessel and stricture of ureter without hydronephrosis: Secondary | ICD-10-CM | POA: Diagnosis not present

## 2018-07-26 DIAGNOSIS — F329 Major depressive disorder, single episode, unspecified: Secondary | ICD-10-CM | POA: Insufficient documentation

## 2018-07-26 DIAGNOSIS — E669 Obesity, unspecified: Secondary | ICD-10-CM | POA: Diagnosis not present

## 2018-07-26 DIAGNOSIS — I10 Essential (primary) hypertension: Secondary | ICD-10-CM | POA: Insufficient documentation

## 2018-07-26 DIAGNOSIS — R69 Illness, unspecified: Secondary | ICD-10-CM | POA: Diagnosis not present

## 2018-07-26 HISTORY — PX: CYSTOSCOPY WITH STENT PLACEMENT: SHX5790

## 2018-07-26 SURGERY — CYSTOSCOPY, WITH STENT INSERTION
Anesthesia: General | Laterality: Left

## 2018-07-26 MED ORDER — FENTANYL CITRATE (PF) 100 MCG/2ML IJ SOLN: 25.0000 ug | INTRAMUSCULAR | Status: DC | PRN

## 2018-07-26 MED ORDER — CIPROFLOXACIN IN D5W 400 MG/200ML IV SOLN
400.0000 mg | Freq: Once | INTRAVENOUS | Status: AC
  Administered 2018-07-26: 400 mg via INTRAVENOUS
  Filled 2018-07-26: qty 200

## 2018-07-26 MED ORDER — STERILE WATER FOR IRRIGATION IR SOLN
Status: DC | PRN
  Administered 2018-07-26: 3000 mL

## 2018-07-26 MED ORDER — LIDOCAINE 2% (20 MG/ML) 5 ML SYRINGE
INTRAMUSCULAR | Status: DC | PRN
  Administered 2018-07-26: 60 mg via INTRAVENOUS

## 2018-07-26 MED ORDER — SUCCINYLCHOLINE CHLORIDE 200 MG/10ML IV SOSY
PREFILLED_SYRINGE | INTRAVENOUS | Status: AC
  Filled 2018-07-26: qty 10

## 2018-07-26 MED ORDER — PHENYLEPHRINE 40 MCG/ML (10ML) SYRINGE FOR IV PUSH (FOR BLOOD PRESSURE SUPPORT)
PREFILLED_SYRINGE | INTRAVENOUS | Status: DC | PRN
  Administered 2018-07-26: 80 ug via INTRAVENOUS

## 2018-07-26 MED ORDER — PROPOFOL 10 MG/ML IV BOLUS
INTRAVENOUS | Status: DC | PRN
  Administered 2018-07-26: 150 ug via INTRAVENOUS

## 2018-07-26 MED ORDER — FENTANYL CITRATE (PF) 100 MCG/2ML IJ SOLN
INTRAMUSCULAR | Status: DC | PRN
  Administered 2018-07-26: 25 ug via INTRAVENOUS

## 2018-07-26 MED ORDER — PHENYLEPHRINE 40 MCG/ML (10ML) SYRINGE FOR IV PUSH (FOR BLOOD PRESSURE SUPPORT)
PREFILLED_SYRINGE | INTRAVENOUS | Status: AC
  Filled 2018-07-26: qty 10

## 2018-07-26 MED ORDER — DEXAMETHASONE SODIUM PHOSPHATE 10 MG/ML IJ SOLN
INTRAMUSCULAR | Status: DC | PRN
  Administered 2018-07-26: 10 mg via INTRAVENOUS

## 2018-07-26 MED ORDER — LACTATED RINGERS IV SOLN
INTRAVENOUS | Status: DC
  Administered 2018-07-26: 14:00:00 via INTRAVENOUS

## 2018-07-26 MED ORDER — FENTANYL CITRATE (PF) 100 MCG/2ML IJ SOLN
INTRAMUSCULAR | Status: AC
  Filled 2018-07-26: qty 2

## 2018-07-26 MED ORDER — ACETAMINOPHEN 500 MG PO TABS
1000.0000 mg | ORAL_TABLET | Freq: Once | ORAL | Status: AC
  Administered 2018-07-26: 1000 mg via ORAL
  Filled 2018-07-26: qty 2

## 2018-07-26 MED ORDER — DIPHENHYDRAMINE HCL 50 MG/ML IJ SOLN
INTRAMUSCULAR | Status: AC
  Filled 2018-07-26: qty 1

## 2018-07-26 MED ORDER — DIPHENHYDRAMINE HCL 50 MG/ML IJ SOLN
INTRAMUSCULAR | Status: DC | PRN
  Administered 2018-07-26: 12.5 mg via INTRAVENOUS

## 2018-07-26 SURGICAL SUPPLY — 12 items
BAG URO CATCHER STRL LF (MISCELLANEOUS) ×2 IMPLANT
CATH INTERMIT  6FR 70CM (CATHETERS) ×1 IMPLANT
CLOTH BEACON ORANGE TIMEOUT ST (SAFETY) ×2 IMPLANT
COVER WAND RF STERILE (DRAPES) IMPLANT
GLOVE BIOGEL M STRL SZ7.5 (GLOVE) ×2 IMPLANT
GOWN STRL REUS W/TWL LRG LVL3 (GOWN DISPOSABLE) ×4 IMPLANT
GUIDEWIRE STR DUAL SENSOR (WIRE) ×2 IMPLANT
KIT TURNOVER KIT A (KITS) ×1 IMPLANT
MANIFOLD NEPTUNE II (INSTRUMENTS) ×2 IMPLANT
PACK CYSTO (CUSTOM PROCEDURE TRAY) ×2 IMPLANT
STENT URET 6FRX24 CONTOUR (STENTS) ×1 IMPLANT
TUBING CONNECTING 10 (TUBING) ×2 IMPLANT

## 2018-07-26 NOTE — Discharge Instructions (Addendum)

## 2018-07-26 NOTE — Op Note (Signed)
Preoperative diagnosis:  1. Left ureteral obstruction  Postoperative diagnosis:  1. Left ureteral obstruction   Procedure:  1. Cystoscopy 2. Left ureteral stent placement (6 x 24 - no string)  Surgeon: Roxy Horseman, Brooke Bonito. M.D.  Anesthesia: General  Complications: None  Intraoperative findings: Stent was mildly encrusted  EBL: Minimal  Specimens: None  Indication: [name] is a 73 y.o. patient with ureteral obstruction. After reviewing the management options for treatment, he elected to proceed with the above surgical procedure(s). We have discussed the potential benefits and risks of the procedure, side effects of the proposed treatment, the likelihood of the patient achieving the goals of the procedure, and any potential problems that might occur during the procedure or recuperation. Informed consent has been obtained.  Description of procedure:  The patient was taken to the operating room and general anesthesia was induced.  The patient was placed in the dorsal lithotomy position, prepped and draped in the usual sterile fashion, and preoperative antibiotics were administered. A preoperative time-out was performed.   Cystourethroscopy was performed.  The patient's urethra was examined and was unremarkable. The bladder was then systematically examined in its entirety. There was no evidence for any bladder tumors, stones, or other mucosal pathology.    Attention then turned to the left ureteral orifice and the patient's indwelling ureteral stent was identified and brought out to the urethral meatus with the flexible graspers.  A 0.38 sensor guidewire was then advanced up the left ureter into the renal pelvis under fluoroscopic guidance.  The wire was then backloaded through the cystoscope and a ureteral stent was advance over the wire using Seldinger technique.  The stent was positioned appropriately under fluoroscopic and cystoscopic guidance.  The wire was then removed  with an adequate stent curl noted in the renal pelvis as well as in the bladder.  The bladder was then emptied and the procedure ended.  The patient appeared to tolerate the procedure well and without complications.  The patient was able to be awakened and transferred to the recovery unit in satisfactory condition.    Pryor Curia MD

## 2018-07-26 NOTE — Anesthesia Preprocedure Evaluation (Addendum)
Anesthesia Evaluation  Patient identified by MRN, date of birth, ID band Patient awake    Reviewed: Allergy & Precautions, NPO status , Patient's Chart, lab work & pertinent test results  Airway Mallampati: II  TM Distance: >3 FB Neck ROM: Full    Dental  (+) Dental Advisory Given, Upper Dentures, Lower Dentures   Pulmonary neg pulmonary ROS,    breath sounds clear to auscultation       Cardiovascular hypertension, Pt. on medications  Rhythm:Regular Rate:Normal     Neuro/Psych PSYCHIATRIC DISORDERS Depression  Neuromuscular disease    GI/Hepatic negative GI ROS, Neg liver ROS,   Endo/Other  Obesity   Renal/GU Renal disease     Musculoskeletal  (+) Arthritis ,   Abdominal   Peds  Hematology  (+) anemia ,   Anesthesia Other Findings   Reproductive/Obstetrics Endometrioid adenocarcinoma s/p TAH, BSO                             Lab Results  Component Value Date   WBC 7.9 07/19/2018   HGB 14.2 07/19/2018   HCT 44.2 07/19/2018   MCV 98.0 07/19/2018   PLT 319 07/19/2018   Lab Results  Component Value Date   CREATININE 0.84 07/19/2018   BUN 12 07/19/2018   NA 139 07/19/2018   K 3.8 07/19/2018   CL 105 07/19/2018   CO2 25 07/19/2018    Anesthesia Physical  Anesthesia Plan  ASA: III  Anesthesia Plan: General   Post-op Pain Management:    Induction: Intravenous  PONV Risk Score and Plan: 3 and Dexamethasone, Treatment may vary due to age or medical condition and Diphenhydramine  Airway Management Planned: Oral ETT  Additional Equipment:   Intra-op Plan:   Post-operative Plan: Extubation in OR  Informed Consent: I have reviewed the patients History and Physical, chart, labs and discussed the procedure including the risks, benefits and alternatives for the proposed anesthesia with the patient or authorized representative who has indicated his/her understanding and  acceptance.     Dental advisory given  Plan Discussed with: CRNA  Anesthesia Plan Comments:         Anesthesia Quick Evaluation

## 2018-07-26 NOTE — Anesthesia Postprocedure Evaluation (Signed)
Anesthesia Post Note  Patient: [name]  Procedure(s) Performed: CYSTOSCOPY WITH STENT CHANGE (Left )     Patient location during evaluation: PACU Anesthesia Type: General Level of consciousness: awake and alert Pain management: pain level controlled Vital Signs Assessment: post-procedure vital signs reviewed and stable Respiratory status: spontaneous breathing, nonlabored ventilation and respiratory function stable Cardiovascular status: blood pressure returned to baseline and stable Postop Assessment: no apparent nausea or vomiting Anesthetic complications: no    Last Vitals:  Vitals:   07/26/18 1445 07/26/18 1500  BP: 130/75 130/73  Pulse: 76 73  Resp: 12 13  Temp:    SpO2: 100% 98%    Last Pain:  Vitals:   07/26/18 1500  TempSrc:   PainSc: 0-No pain                 Catalina Gravel

## 2018-07-26 NOTE — Anesthesia Procedure Notes (Signed)
Procedure Name: Intubation Date/Time: 07/26/2018 2:15 PM Performed by: Maxwell Caul, CRNA Pre-anesthesia Checklist: Patient identified, Emergency Drugs available, Suction available and Patient being monitored Patient Re-evaluated:Patient Re-evaluated prior to induction Oxygen Delivery Method: Circle system utilized Preoxygenation: Pre-oxygenation with 100% oxygen Induction Type: IV induction Laryngoscope Size: Mac and 3 Grade View: Grade I Tube type: Oral Tube size: 7.5 mm Number of attempts: 1 Airway Equipment and Method: Stylet Placement Confirmation: ETT inserted through vocal cords under direct vision,  positive ETCO2 and breath sounds checked- equal and bilateral Secured at: 21 cm Tube secured with: Tape Dental Injury: Teeth and Oropharynx as per pre-operative assessment

## 2018-07-26 NOTE — Transfer of Care (Signed)
Immediate Anesthesia Transfer of Care Note  Patient: [name]  Procedure(s) Performed: CYSTOSCOPY WITH STENT CHANGE (Left )  Patient Location: PACU  Anesthesia Type:General  Level of Consciousness: awake, alert  and oriented  Airway & Oxygen Therapy: Patient Spontanous Breathing and Patient connected to face mask oxygen  Post-op Assessment: Report given to RN and Post -op Vital signs reviewed and stable  Post vital signs: Reviewed and stable  Last Vitals:  Vitals Value Taken Time  BP 128/67 07/26/2018  2:36 PM  Temp    Pulse 74 07/26/2018  2:38 PM  Resp 17 07/26/2018  2:38 PM  SpO2 100 % 07/26/2018  2:38 PM  Vitals shown include unvalidated device data.  Last Pain:  Vitals:   07/26/18 1327  TempSrc:   PainSc: 0-No pain      Patients Stated Pain Goal: 2 ([id number])  Complications: No apparent anesthesia complications

## 2018-07-27 ENCOUNTER — Encounter (HOSPITAL_COMMUNITY): Payer: Self-pay | Admitting: Urology

## 2018-07-30 DIAGNOSIS — M47816 Spondylosis without myelopathy or radiculopathy, lumbar region: Secondary | ICD-10-CM | POA: Diagnosis not present

## 2018-07-30 DIAGNOSIS — M7918 Myalgia, other site: Secondary | ICD-10-CM | POA: Diagnosis not present

## 2018-07-30 DIAGNOSIS — M47818 Spondylosis without myelopathy or radiculopathy, sacral and sacrococcygeal region: Secondary | ICD-10-CM | POA: Diagnosis not present

## 2018-07-30 DIAGNOSIS — M461 Sacroiliitis, not elsewhere classified: Secondary | ICD-10-CM | POA: Diagnosis not present

## 2018-07-30 DIAGNOSIS — M7071 Other bursitis of hip, right hip: Secondary | ICD-10-CM | POA: Diagnosis not present

## 2018-07-30 DIAGNOSIS — M961 Postlaminectomy syndrome, not elsewhere classified: Secondary | ICD-10-CM | POA: Diagnosis not present

## 2018-07-30 DIAGNOSIS — R69 Illness, unspecified: Secondary | ICD-10-CM | POA: Diagnosis not present

## 2018-07-30 DIAGNOSIS — Z79899 Other long term (current) drug therapy: Secondary | ICD-10-CM | POA: Diagnosis not present

## 2018-08-01 ENCOUNTER — Other Ambulatory Visit: Payer: Self-pay | Admitting: Physician Assistant

## 2018-08-01 ENCOUNTER — Telehealth: Payer: Self-pay | Admitting: Physician Assistant

## 2018-08-01 MED ORDER — MELOXICAM 15 MG PO TABS: 15.0000 mg | ORAL_TABLET | Freq: Every day | ORAL | 2 refills | Status: DC

## 2018-08-01 NOTE — Telephone Encounter (Signed)
Pt aware.

## 2018-08-01 NOTE — Telephone Encounter (Signed)
Pharmacy: [facility]   PT is wanting to get back on her meloxicam 15 mg not sure if AJ needs to see her, do a virtual visit or can she just send it into the Pharmacy

## 2018-08-01 NOTE — Telephone Encounter (Signed)
Please advise 

## 2018-08-01 NOTE — Telephone Encounter (Signed)
Medication is sent to the pharmacy.

## 2018-08-02 ENCOUNTER — Encounter: Payer: Self-pay | Admitting: Gastroenterology

## 2018-08-13 ENCOUNTER — Ambulatory Visit: Payer: Medicare HMO | Admitting: Physical Therapy

## 2018-09-28 ENCOUNTER — Other Ambulatory Visit: Payer: Self-pay | Admitting: Physician Assistant

## 2018-09-28 DIAGNOSIS — Z1231 Encounter for screening mammogram for malignant neoplasm of breast: Secondary | ICD-10-CM

## 2018-10-22 ENCOUNTER — Encounter: Payer: Self-pay | Admitting: Gastroenterology

## 2018-10-29 DIAGNOSIS — M47816 Spondylosis without myelopathy or radiculopathy, lumbar region: Secondary | ICD-10-CM | POA: Diagnosis not present

## 2018-10-29 DIAGNOSIS — M47818 Spondylosis without myelopathy or radiculopathy, sacral and sacrococcygeal region: Secondary | ICD-10-CM | POA: Diagnosis not present

## 2018-10-29 DIAGNOSIS — I10 Essential (primary) hypertension: Secondary | ICD-10-CM | POA: Diagnosis not present

## 2018-10-29 DIAGNOSIS — M961 Postlaminectomy syndrome, not elsewhere classified: Secondary | ICD-10-CM | POA: Diagnosis not present

## 2018-10-29 DIAGNOSIS — Z6836 Body mass index (BMI) 36.0-36.9, adult: Secondary | ICD-10-CM | POA: Diagnosis not present

## 2018-10-29 DIAGNOSIS — Z79899 Other long term (current) drug therapy: Secondary | ICD-10-CM | POA: Diagnosis not present

## 2018-10-29 DIAGNOSIS — M7918 Myalgia, other site: Secondary | ICD-10-CM | POA: Diagnosis not present

## 2018-10-29 DIAGNOSIS — M7071 Other bursitis of hip, right hip: Secondary | ICD-10-CM | POA: Diagnosis not present

## 2018-10-29 DIAGNOSIS — M7072 Other bursitis of hip, left hip: Secondary | ICD-10-CM | POA: Diagnosis not present

## 2018-11-05 DIAGNOSIS — N135 Crossing vessel and stricture of ureter without hydronephrosis: Secondary | ICD-10-CM | POA: Diagnosis not present

## 2018-11-07 ENCOUNTER — Other Ambulatory Visit: Payer: Self-pay | Admitting: Urology

## 2018-11-12 ENCOUNTER — Ambulatory Visit: Payer: Medicare HMO | Admitting: *Deleted

## 2018-11-12 ENCOUNTER — Other Ambulatory Visit: Payer: Self-pay

## 2018-11-12 VITALS — Ht 60.0 in | Wt 185.0 lb

## 2018-11-12 DIAGNOSIS — Z800 Family history of malignant neoplasm of digestive organs: Secondary | ICD-10-CM

## 2018-11-12 MED ORDER — SUPREP BOWEL PREP KIT 17.5-3.13-1.6 GM/177ML PO SOLN: 1.0000 | Freq: Once | ORAL | 0 refills | Status: AC

## 2018-11-12 NOTE — Progress Notes (Signed)
No egg or soy allergy known to patient  No issues with past sedation with any surgeries  or procedures, no intubation problems  No diet pills per patient No home 02 use per patient  No blood thinners per patient  Pt denies issues with constipation - she has to take a laxative to have a bowel movement- uses an OTC ex lax every 2-3 days - stools are hard then becomes softer - will do a 2 day SUprep  No A fib or A flutter  EMMI video sent to pt's e mail    Pt verified name, DOB, address and insurance during PV today. Pt mailed instruction packet to included paper to complete and mail back to [facility] with addressed and stamped envelope, Emmi video, copy of consent form to read and not return, and instructions.. PV completed over the phone. Pt encouraged to call with questions or issues    Pt is aware that care partner will wait in the car during proceudre; if they feel like they will be too hot to wait in the car; they may wait in the lobby.  We want them to wear a mask (we do not have any that we can provide them), practice social distancing, and we will check their temperatures when they get here.  I did remind patient that their care partner needs to stay in the parking lot the entire time. Pt will wear mask into building.

## 2018-11-19 ENCOUNTER — Ambulatory Visit
Admission: RE | Admit: 2018-11-19 | Discharge: 2018-11-19 | Disposition: A | Discharge status: Home / Self Care | Payer: Medicare HMO | Source: Ambulatory Visit | Attending: Physician Assistant | Admitting: Physician Assistant

## 2018-11-19 ENCOUNTER — Other Ambulatory Visit: Payer: Self-pay

## 2018-11-19 DIAGNOSIS — Z1231 Encounter for screening mammogram for malignant neoplasm of breast: Secondary | ICD-10-CM | POA: Diagnosis not present

## 2018-11-21 ENCOUNTER — Telehealth: Payer: Self-pay | Admitting: Gastroenterology

## 2018-11-21 NOTE — Telephone Encounter (Signed)
She received general anesthesia for a ureteral stent change in March, so I expect she will again have general anesthesia for the stent change on the 27th.  Although she would most likely feel well enough after recovering from anesthesia to have her colonoscopy less than a week later, I feel that in order to be on the safe side, we should move her colonoscopy back a couple of weeks just in case there happened to be any unexpected difficulties after the stent placement.

## 2018-11-21 NOTE — Telephone Encounter (Signed)
Pt called to inform that she will have stent removed on 7/27, she is scheduled for a colon on 8/3, she wants to know if she should r/s or if it is safe to go under sedation that soon. Pls call her.

## 2018-11-21 NOTE — Telephone Encounter (Signed)
Pt is scheduled for cystoscopy with stent change on 12/03/18. She is also scheduled for a colonoscopy on 12/10/18. Pt wants to know if she should reschedule her procedure to a later date. She wants to make sure it will be ok being sedated for both procedures that close. Please advise.

## 2018-11-22 NOTE — Telephone Encounter (Signed)
The pt has been rescheduled to 8/17.  She has been re instructed and  verbalized understanding.

## 2018-11-22 NOTE — Telephone Encounter (Signed)
Left message on machine to call back  

## 2018-11-26 ENCOUNTER — Encounter: Payer: Medicare HMO | Admitting: Gastroenterology

## 2018-11-29 ENCOUNTER — Other Ambulatory Visit (HOSPITAL_COMMUNITY): Payer: Self-pay | Admitting: *Deleted

## 2018-11-29 ENCOUNTER — Other Ambulatory Visit (HOSPITAL_COMMUNITY)
Admission: RE | Admit: 2018-11-29 | Discharge: 2018-11-29 | Disposition: A | Discharge status: Home / Self Care | Payer: Medicare HMO | Source: Ambulatory Visit | Attending: Urology | Admitting: Urology

## 2018-11-29 DIAGNOSIS — Z1159 Encounter for screening for other viral diseases: Secondary | ICD-10-CM | POA: Diagnosis not present

## 2018-11-29 LAB — SARS CORONAVIRUS 2 (TAT 6-24 HRS): SARS Coronavirus 2: NEGATIVE

## 2018-11-29 NOTE — Patient Instructions (Addendum)
YOU HAD  A COVID 19 TEST ON 11-29-2018. ONCE YOUR COVID TEST IS COMPLETED, PLEASE BEGIN THE QUARANTINE INSTRUCTIONS AS OUTLINED IN YOUR HANDOUT.                Wava Kildow Holderness    Your procedure is scheduled on: 12-03-2018   Report to [facility] Main  Entrance    Report to admitting at 37 AM   [address] [place].    Call this number if you have problems the morning of surgery [phone]    Remember: Do not eat food or drink liquids :After Midnight. BRUSH YOUR TEETH MORNING OF SURGERY AND RINSE YOUR MOUTH OUT, NO CHEWING GUM CANDY OR MINTS.     Take these medicines the morning of surgery with A SIP OF WATER: oxycodone if needed                               You may not have any metal on your body including hair pins and              piercings  Do not wear jewelry, make-up, lotions, powders or perfumes, deodorant             Do not wear nail polish.  Do not shave  48 hours prior to surgery.                Do not bring valuables to the hospital. [place].  Contacts, dentures or bridgework may not be worn into surgery.  Leave suitcase in the car. After surgery it may be brought to your room.     Patients discharged the day of surgery will not be allowed to drive home. IF YOU ARE HAVING SURGERY AND GOING HOME THE SAME DAY, YOU MUST HAVE AN ADULT TO DRIVE YOU HOME AND BE WITH YOU FOR 24 HOURS. YOU MAY GO HOME BY TAXI OR UBER OR ORTHERWISE, BUT AN ADULT MUST ACCOMPANY YOU HOME AND STAY WITH YOU FOR 24 HOURS.  Name and phone number of your driver:  Special Instructions: N/A              Please read over the following fact sheets you were given: _____________________________________________________________________             [facility] - Preparing for Surgery Before surgery, you can play an important role.  Because skin is not sterile, your skin needs to be as free of  germs as possible.  You can reduce the number of germs on your skin by washing with CHG (chlorahexidine gluconate) soap before surgery.  CHG is an antiseptic cleaner which kills germs and bonds with the skin to continue killing germs even after washing. Please DO NOT use if you have an allergy to CHG or antibacterial soaps.  If your skin becomes reddened/irritated stop using the CHG and inform your nurse when you arrive at Short Stay. Do not shave (including legs and underarms) for at least 48 hours prior to the first CHG shower.  You may shave your face/neck. Please follow these instructions carefully:  1.  Shower with CHG Soap the night before surgery and the  morning of Surgery.  2.  If you choose to wash your hair, wash your hair first as  usual with your  normal  shampoo.  3.  After you shampoo, rinse your hair and body thoroughly to remove the  shampoo.                           4.  Use CHG as you would any other liquid soap.  You can apply chg directly  to the skin and wash                       Gently with a scrungie or clean washcloth.  5.  Apply the CHG Soap to your body ONLY FROM THE NECK DOWN.   Do not use on face/ open                           Wound or open sores. Avoid contact with eyes, ears mouth and genitals (private parts).                       Wash face,  Genitals (private parts) with your normal soap.             6.  Wash thoroughly, paying special attention to the area where your surgery  will be performed.  7.  Thoroughly rinse your body with warm water from the neck down.  8.  DO NOT shower/wash with your normal soap after using and rinsing off  the CHG Soap.                9.  Pat yourself dry with a clean towel.            10.  Wear clean pajamas.            11.  Place clean sheets on your bed the night of your first shower and do not  sleep with pets. Day of Surgery : Do not apply any lotions/deodorants the morning of surgery.  Please wear clean clothes to the  hospital/surgery center.  FAILURE TO FOLLOW THESE INSTRUCTIONS MAY RESULT IN THE CANCELLATION OF YOUR SURGERY PATIENT SIGNATURE_________________________________  NURSE SIGNATURE__________________________________  ________________________________________________________________________

## 2018-11-30 ENCOUNTER — Encounter (HOSPITAL_COMMUNITY)
Admission: RE | Admit: 2018-11-30 | Discharge: 2018-11-30 | Disposition: A | Discharge status: Home / Self Care | Payer: Medicare HMO | Source: Ambulatory Visit | Attending: Urology | Admitting: Urology

## 2018-11-30 ENCOUNTER — Encounter (HOSPITAL_COMMUNITY): Payer: Self-pay

## 2018-11-30 ENCOUNTER — Other Ambulatory Visit: Payer: Self-pay

## 2018-11-30 DIAGNOSIS — Z01810 Encounter for preprocedural cardiovascular examination: Secondary | ICD-10-CM | POA: Diagnosis not present

## 2018-11-30 DIAGNOSIS — Z01812 Encounter for preprocedural laboratory examination: Secondary | ICD-10-CM | POA: Diagnosis not present

## 2018-11-30 DIAGNOSIS — I10 Essential (primary) hypertension: Secondary | ICD-10-CM | POA: Diagnosis not present

## 2018-11-30 DIAGNOSIS — N135 Crossing vessel and stricture of ureter without hydronephrosis: Secondary | ICD-10-CM | POA: Diagnosis not present

## 2018-11-30 DIAGNOSIS — Z79899 Other long term (current) drug therapy: Secondary | ICD-10-CM | POA: Diagnosis not present

## 2018-11-30 DIAGNOSIS — Z01818 Encounter for other preprocedural examination: Secondary | ICD-10-CM | POA: Insufficient documentation

## 2018-11-30 LAB — BASIC METABOLIC PANEL
Anion gap: 8 (ref 5–15)
BUN: 13 mg/dL (ref 8–23)
CO2: 28 mmol/L (ref 22–32)
Calcium: 9 mg/dL (ref 8.9–10.3)
Chloride: 103 mmol/L (ref 98–111)
Creatinine, Ser: 0.79 mg/dL (ref 0.44–1.00)
GFR calc Af Amer: >60 mL/min (ref >60)
GFR calc non Af Amer: >60 mL/min (ref >60)
Glucose, Bld: 98 mg/dL (ref 70–99)
Potassium: 3.9 mmol/L (ref 3.5–5.1)
Sodium: 139 mmol/L (ref 135–145)

## 2018-11-30 LAB — CBC
HCT: 41.6 % (ref 36.0–46.0)
Hemoglobin: 13.9 g/dL (ref 12.0–15.0)
MCH: 33 pg (ref 26.0–34.0)
MCHC: 33.4 g/dL (ref 30.0–36.0)
MCV: 98.8 fL (ref 80.0–100.0)
Platelets: 316 10*3/uL (ref 150–400)
RBC: 4.21 MIL/uL (ref 3.87–5.11)
RDW: 12.1 % (ref 11.5–15.5)
WBC: 8.7 10*3/uL (ref 4.0–10.5)
nRBC: 0 % (ref 0.0–0.2)

## 2018-11-30 NOTE — H&P (Signed)
Office Visit Report     11/05/2018   --------------------------------------------------------------------------------   [name]  MRN: [id number]  DOB: [birth year], 73 year old Female  SSN: [id number]   PRIMARY CARE:  Chipper Herb, MD  REFERRING:  Raynelle Bring, Eduardo Osier  PROVIDER:  Raynelle Bring, M.D.  LOCATION:  [facility] [phone]     --------------------------------------------------------------------------------   CC/HPI: Left ureteral obstruction with history of recurrent left pyelonephritis   She returns today for continued management of her left ureteral obstruction due to stricture. She has continued to tolerate her indwelling left ureteral stent very well. Her last stent was changed on March 19th. She did have mild encrustation of the stent at that time. This was a 6 x 24 double-J stent. She remains in stable overall health. She continues on nitrofurantoin prophylaxis nightly.     ALLERGIES: Ondansetron HCl TABS Sulfa Drugs    MEDICATIONS: Allegra Allergy  Amitriptyline Hcl 25 mg tablet Oral  Lisinopril 20 MG Oral Tablet Oral  Neurontin 300 MG Oral Capsule Oral  Nitrofurantoin Mono-Macro 100 mg capsule  Uristat     GU PSH: Catheterization For Collection Of Specimen, Single Patient, All Places Of Service - 08/01/2017 Cysto Uretero Balloon Dil Strict - 2010 Cystoscopy Insert Stent, Left - 07/26/2018, Left - 03/29/2018, Left - 12/07/2017, 2010, 2010 Omental Flap; Intra-abdom - 2010 Release Ureter - 2010 Sling - 2010       PSH Notes: Total Hip Replacement, Ureterolysis, Abdominal Surgery Omental Flap, Intra-abdominal, Cystourethroscopy W/ Ureteroscopy W/ Tx Of Ureteral Strict, Cystoscopy With Insertion Of Ureteral Stent Left, Mid-Urethral Sling Operation, Cystoscopy With Insertion Of Ureteral Stent Left, Back Surgery   NON-GU PSH: Total Hip Replacement - 2016     GU PMH: Flank Pain - 08/01/2017 Acute Cystitis/UTI - 03/01/2017 Chronic  cystitis (w/o hematuria), Chronic cystitis - 2016 Ureteral stricture, Ureteral obstruction - 2016, Ureteral stricture, - 2014 Dysuria, Dysuria - 2016 Urinary Tract Inf, Unspec site, Pyuria - 2016, Urinary tract infection, - 2016 Urinary Urgency, Urinary urgency - 2016 Pyelonephritis, Pyelonephritis, acute - 2014      PMH Notes:   1) Left hydronephrosis and ureteral obstruction secondary to ureteral stricture: She is s/p a left anterior lumbar decompression and arthrodesis in September 2009. This was performed via an anterior retroperitoneal approach. She presented to the emergency department in October 2009 with left lower quadrant pain, dysuria, and fever. A CT scan demonstrated a left lower quadrant fluid collection with left hydronephrosis. She was admitted to the hospital and was seen in consultation by Dr. Jeffie Pollock. A left percutaneous nephrostomy tube was placed. CT guided aspiration of the fluid collection was also performed and the fluid was consistent with a probable lymphocele. An antegrade stent was placed which was then removed in November 2009. She did well until May 2010 when she again presented with flank pain and fever and a renal ultrasound demonstrated left hydronephrosis. She was taken to the OR by Dr. Jeffie Pollock and a retrograde pyelogram showed a long proximal narrowing from the level of the left UPJ to the level of the bifurcation of the aorta. A stent was again placed which was removed in June 2010. I initially evaluated her in July 2010 and she was asymptomatic and she was therefore observed. However, she again developed left flank pain in August 2010 and was taken to the OR for further evaluation. She was found to have a short ureteral stricture at the level of L4 likely a result of an  ischemic injury from her prior surgery. This was treated at that time with balloon dilation and stent placement. She again developed pyelonephritis and elected to proceed with definitive surgical repair. She  underwent exploration on 03/11/09 and was found to have retroperitoneal fibrosis as the cause of her ureteral obstruction and likely related to her prior surgery. Her ureter was very difficult to remove from the surrounding fibrosis but she was able to undergo definitive ureterolysis and omental wrap. She was noted to have declining relative renal function of her left kidney in March 2015 (25%). After discussing her options, the complexity of her surgical history, and the fact her global renal function remained stable, she elected to proceed with observation rather than intervention.   Mar 2019: Left pyelonephritis - E. coli  Jun 2019: Left pyelonephritis - E. coli resistant to fluorquinolones, TMP/SMX, and tetracycline (sensitive to cephalexin)  Aug 2019: Left ureteral stent placement (initial) - 6 x 03 Apr 2018: Left ureteral stent - 6 x 31 Jul 2018: Left ureteral stent - 6 x 24   2) Stress incontinence: She is s/p a urethral sling by Dr. Jeffie Pollock in 2005.   3) Recurrent UTIs: She has previously been on prophylaxis with nitrofurantoin but was able to discontinue this therapy in 2013. She developed recurrent pyelonephritis in 2017.       NON-GU PMH: Pyuria/other UA findings (Stable) - 06/19/2018, - 03/01/2017 Hypertension    FAMILY HISTORY: 1 Daughter - Daughter 2 sons - Son Death of family member - Sister, Father, Mother Diabetes - Sister, Son, Father, Mother End Stage Renal Disease - No Family History   SOCIAL HISTORY: Marital Status: Widowed Preferred Language: English; Ethnicity: Not Hispanic Or Latino; Race: White Current Smoking Status: Patient has never smoked.   Tobacco Use Assessment Completed: Used Tobacco in last 30 days? Does not drink anymore.  Drinks 3 caffeinated drinks per day.    REVIEW OF SYSTEMS:    GU Review Female:   Patient denies frequent urination, hard to postpone urination, burning /pain with urination, get up at night to urinate, leakage of urine, stream  starts and stops, trouble starting your stream, have to strain to urinate, and currently pregnant.  Gastrointestinal (Upper):   Patient denies nausea and vomiting.  Gastrointestinal (Lower):   Patient denies diarrhea and constipation.  Constitutional:   Patient denies fever, night sweats, weight loss, and fatigue.  Skin:   Patient denies skin rash/ lesion and itching.  Eyes:   Patient denies blurred vision and double vision.  Ears/ Nose/ Throat:   Patient denies sore throat and sinus problems.  Hematologic/Lymphatic:   Patient denies swollen glands and easy bruising.  Cardiovascular:   Patient denies chest pains and leg swelling.  Respiratory:   Patient denies cough and shortness of breath.  Endocrine:   Patient denies excessive thirst.  Musculoskeletal:   Patient denies back pain and joint pain.  Neurological:   Patient denies headaches and dizziness.  Psychologic:   Patient denies depression and anxiety.   VITAL SIGNS:      11/05/2018 12:27 PM  Weight 186 lb / 84.37 kg  Height 60 in / 152.4 cm  BP 158/73 mmHg  Pulse 72 /min  BMI 36.3 kg/m   MULTI-SYSTEM PHYSICAL EXAMINATION:    Constitutional: Well-nourished. No physical deformities. Normally developed. Good grooming.  Respiratory: No labored breathing, no use of accessory muscles. Clear bilaterally.  Cardiovascular: Normal temperature, normal extremity pulses, no swelling, no varicosities. Regular rate and rhythm.  PAST DATA REVIEWED:  Source Of History:  Patient  Urine Test Review:   Urinalysis  Notes:                     Urine has been cultured.   PROCEDURES:          Urinalysis w/Scope Dipstick Dipstick Cont'd Micro  Color: Yellow Bilirubin: Neg mg/dL WBC/hpf: 20 - 40/hpf  Appearance: Cloudy Ketones: Neg mg/dL RBC/hpf: 3 - 10/hpf  Specific Gravity: 1.025 Blood: 2+ ery/uL Bacteria: Few (10-25/hpf)  pH: 5.5 Protein: Neg mg/dL Cystals: NS (Not Seen)  Glucose: Neg mg/dL Urobilinogen: 0.2 mg/dL Casts: NS (Not Seen)     Nitrites: Neg Trichomonas: Not Present    Leukocyte Esterase: 3+ leu/uL Mucous: Not Present      Epithelial Cells: 0 - 5/hpf      Yeast: NS (Not Seen)      Sperm: Not Present    ASSESSMENT:      ICD-10 Details  1 GU:   Ureteral stricture - N13.5    PLAN:           Orders Labs Urine Culture          Schedule Return Visit/Planned Activity: Other See Visit Notes             Note: Will call to schedule surgery          Document Letter(s):  Created for Patient: Clinical Summary         Notes:   1. Left ureteral obstruction: Her urine has been cultured today. We will plan to proceed with cystoscopy and left ureteral stent change in mid to late July. She will be placed on appropriate preoperative antibiotics pending her culture results today. We have discussed having her continue nitrofurantoin prophylaxis until her stent change. We will then plan to discontinue prophylaxis to see if she needs to continue this moving forward.   Cc: Dr. Redge Gainer    * Signed by Raynelle Bring, M.D. on 11/05/18 at 7:46 PM (EDT)*

## 2018-12-03 ENCOUNTER — Encounter (HOSPITAL_COMMUNITY)
Admission: RE | Disposition: A | Discharge status: Home / Self Care | Payer: Self-pay | Source: Home / Self Care | Attending: Urology

## 2018-12-03 ENCOUNTER — Encounter (HOSPITAL_COMMUNITY): Payer: Self-pay | Admitting: Emergency Medicine

## 2018-12-03 ENCOUNTER — Other Ambulatory Visit: Payer: Self-pay

## 2018-12-03 ENCOUNTER — Ambulatory Visit (HOSPITAL_COMMUNITY): Payer: Medicare HMO | Admitting: Anesthesiology

## 2018-12-03 ENCOUNTER — Ambulatory Visit (HOSPITAL_COMMUNITY)
Admission: RE | Admit: 2018-12-03 | Discharge: 2018-12-03 | Disposition: A | Discharge status: Home / Self Care | Payer: Medicare HMO | Attending: Urology | Admitting: Urology

## 2018-12-03 ENCOUNTER — Ambulatory Visit (HOSPITAL_COMMUNITY): Payer: Medicare HMO | Admitting: Physician Assistant

## 2018-12-03 ENCOUNTER — Ambulatory Visit (HOSPITAL_COMMUNITY): Payer: Medicare HMO

## 2018-12-03 DIAGNOSIS — N135 Crossing vessel and stricture of ureter without hydronephrosis: Secondary | ICD-10-CM | POA: Diagnosis not present

## 2018-12-03 DIAGNOSIS — Z01812 Encounter for preprocedural laboratory examination: Secondary | ICD-10-CM | POA: Diagnosis not present

## 2018-12-03 DIAGNOSIS — Z79899 Other long term (current) drug therapy: Secondary | ICD-10-CM | POA: Insufficient documentation

## 2018-12-03 DIAGNOSIS — Z01810 Encounter for preprocedural cardiovascular examination: Secondary | ICD-10-CM | POA: Diagnosis not present

## 2018-12-03 DIAGNOSIS — I10 Essential (primary) hypertension: Secondary | ICD-10-CM | POA: Insufficient documentation

## 2018-12-03 DIAGNOSIS — E785 Hyperlipidemia, unspecified: Secondary | ICD-10-CM | POA: Diagnosis not present

## 2018-12-03 DIAGNOSIS — G629 Polyneuropathy, unspecified: Secondary | ICD-10-CM | POA: Diagnosis not present

## 2018-12-03 HISTORY — PX: CYSTOSCOPY WITH STENT PLACEMENT: SHX5790

## 2018-12-03 SURGERY — CYSTOSCOPY, WITH STENT INSERTION
Anesthesia: General | Site: Urethra | Laterality: Left

## 2018-12-03 MED ORDER — OXYCODONE HCL 5 MG/5ML PO SOLN: 5.0000 mg | Freq: Once | ORAL | Status: DC | PRN

## 2018-12-03 MED ORDER — FENTANYL CITRATE (PF) 100 MCG/2ML IJ SOLN: 25.0000 ug | INTRAMUSCULAR | Status: DC | PRN

## 2018-12-03 MED ORDER — FENTANYL CITRATE (PF) 250 MCG/5ML IJ SOLN
INTRAMUSCULAR | Status: DC | PRN
  Administered 2018-12-03 (×4): 25 ug via INTRAVENOUS

## 2018-12-03 MED ORDER — PROPOFOL 10 MG/ML IV BOLUS
INTRAVENOUS | Status: DC | PRN
  Administered 2018-12-03: 130 mg via INTRAVENOUS

## 2018-12-03 MED ORDER — LIDOCAINE 2% (20 MG/ML) 5 ML SYRINGE
INTRAMUSCULAR | Status: AC
  Filled 2018-12-03: qty 5

## 2018-12-03 MED ORDER — LACTATED RINGERS IV SOLN
INTRAVENOUS | Status: DC
  Administered 2018-12-03: 09:00:00 via INTRAVENOUS

## 2018-12-03 MED ORDER — STERILE WATER FOR IRRIGATION IR SOLN
Status: DC | PRN
  Administered 2018-12-03: 3000 mL via INTRAVESICAL

## 2018-12-03 MED ORDER — CEFAZOLIN SODIUM-DEXTROSE 2-4 GM/100ML-% IV SOLN
2.0000 g | Freq: Once | INTRAVENOUS | Status: AC
  Administered 2018-12-03: 2 g via INTRAVENOUS
  Filled 2018-12-03: qty 100

## 2018-12-03 MED ORDER — FENTANYL CITRATE (PF) 100 MCG/2ML IJ SOLN
INTRAMUSCULAR | Status: AC
  Filled 2018-12-03: qty 2

## 2018-12-03 MED ORDER — OXYCODONE HCL 5 MG PO TABS: 5.0000 mg | ORAL_TABLET | Freq: Once | ORAL | Status: DC | PRN

## 2018-12-03 MED ORDER — LIDOCAINE 2% (20 MG/ML) 5 ML SYRINGE
INTRAMUSCULAR | Status: DC | PRN
  Administered 2018-12-03: 60 mg via INTRAVENOUS

## 2018-12-03 MED ORDER — DEXAMETHASONE SODIUM PHOSPHATE 10 MG/ML IJ SOLN
INTRAMUSCULAR | Status: AC
  Filled 2018-12-03: qty 1

## 2018-12-03 MED ORDER — PROMETHAZINE HCL 25 MG/ML IJ SOLN: 6.2500 mg | INTRAMUSCULAR | Status: DC | PRN

## 2018-12-03 MED ORDER — DEXAMETHASONE SODIUM PHOSPHATE 10 MG/ML IJ SOLN
INTRAMUSCULAR | Status: DC | PRN
  Administered 2018-12-03: 5 mg via INTRAVENOUS

## 2018-12-03 MED ORDER — PROPOFOL 10 MG/ML IV BOLUS
INTRAVENOUS | Status: AC
  Filled 2018-12-03: qty 20

## 2018-12-03 SURGICAL SUPPLY — 13 items
BAG URO CATCHER STRL LF (MISCELLANEOUS) ×2 IMPLANT
CATH INTERMIT  6FR 70CM (CATHETERS) ×1 IMPLANT
CLOTH BEACON ORANGE TIMEOUT ST (SAFETY) ×2 IMPLANT
COVER WAND RF STERILE (DRAPES) IMPLANT
GLOVE BIOGEL M STRL SZ7.5 (GLOVE) ×2 IMPLANT
GOWN STRL REUS W/TWL LRG LVL3 (GOWN DISPOSABLE) ×4 IMPLANT
GUIDEWIRE STR DUAL SENSOR (WIRE) ×2 IMPLANT
KIT TURNOVER KIT A (KITS) ×1 IMPLANT
MANIFOLD NEPTUNE II (INSTRUMENTS) ×2 IMPLANT
PACK CYSTO (CUSTOM PROCEDURE TRAY) ×2 IMPLANT
STENT CONTOUR 6FRX24X.038 (STENTS) ×1 IMPLANT
TUBING CONNECTING 10 (TUBING) ×2 IMPLANT
TUBING UROLOGY SET (TUBING) IMPLANT

## 2018-12-03 NOTE — Anesthesia Procedure Notes (Signed)
Procedure Name: LMA Insertion Date/Time: 12/03/2018 10:58 AM Performed by: Myna Bright, CRNA Pre-anesthesia Checklist: Patient identified, Emergency Drugs available, Suction available and Patient being monitored Patient Re-evaluated:Patient Re-evaluated prior to induction Oxygen Delivery Method: Circle system utilized Preoxygenation: Pre-oxygenation with 100% oxygen Induction Type: IV induction Ventilation: Mask ventilation without difficulty LMA: LMA inserted LMA Size: 4.0 Number of attempts: 1 Placement Confirmation: positive ETCO2 and breath sounds checked- equal and bilateral Tube secured with: Tape Dental Injury: Teeth and Oropharynx as per pre-operative assessment

## 2018-12-03 NOTE — Interval H&P Note (Signed)
History and Physical Interval Note:  12/03/2018 10:26 AM  [name]  has presented today for surgery, with the diagnosis of LEFT URETERAL OBSTRUCTION.  The various methods of treatment have been discussed with the patient and family. After consideration of risks, benefits and other options for treatment, the patient has consented to  Procedure(s): CYSTOSCOPY WITH STENT CHANGE (Left) as a surgical intervention.  The patient's history has been reviewed, patient examined, no change in status, stable for surgery.  I have reviewed the patient's chart and labs.  Questions were answered to the patient's satisfaction.     [employer]

## 2018-12-03 NOTE — Op Note (Signed)
Preoperative diagnosis:  1. Left ureteral obstruction   Postoperative diagnosis:  1. Left ureteral obstruction   Procedure:  1. Cystoscopy 2. Left ureteral stent placement (6 x 24 - no string)  Surgeon: Roxy Horseman, Brooke Bonito. M.D.  Anesthesia: General  Complications: None  Intraoperative findings: Her stent was mildly encrusted.  EBL: Minimal  Specimens: None  Indication: [name] is a 73 y.o. patient with left ureteral obstruction. After reviewing the management options for treatment, he elected to proceed with the above surgical procedure(s). We have discussed the potential benefits and risks of the procedure, side effects of the proposed treatment, the likelihood of the patient achieving the goals of the procedure, and any potential problems that might occur during the procedure or recuperation. Informed consent has been obtained.  Description of procedure:  The patient was taken to the operating room and general anesthesia was induced.  The patient was placed in the dorsal lithotomy position, prepped and draped in the usual sterile fashion, and preoperative antibiotics were administered. A preoperative time-out was performed.   Cystourethroscopy was performed.  The patient's urethra was examined and was unremarkable. The bladder was then systematically examined in its entirety. There was no evidence for any bladder tumors, stones, or other mucosal pathology.    Attention then turned to the left ureteral orifice and the patient's indwelling ureteral stent was identified and brought out to the urethral meatus with the flexible graspers.  A 0.38 sensor guidewire was then advanced up the left ureter into the renal pelvis under fluoroscopic guidance.  The wire was then backloaded through the cystoscope and a ureteral stent was advance over the wire using Seldinger technique.  The stent was positioned appropriately under fluoroscopic and cystoscopic guidance.  The wire was then  removed with an adequate stent curl noted in the renal pelvis as well as in the bladder.  The bladder was then emptied and the procedure ended.  The patient appeared to tolerate the procedure well and without complications.  The patient was able to be awakened and transferred to the recovery unit in satisfactory condition.    Pryor Curia MD

## 2018-12-03 NOTE — Discharge Instructions (Signed)
1. You may see some blood in the urine and may have some burning with urination for 48-72 hours. You also may notice that you have to urinate more frequently or urgently after your procedure which is normal.  2. You should call should you develop an inability urinate, fever > 101, persistent nausea and vomiting that prevents you from eating or drinking to stay hydrated.  3. If you have a stent, you will likely urinate more frequently and urgently until the stent is removed and you may experience some discomfort/pain in the lower abdomen and flank especially when urinating. You may take pain medication prescribed to you if needed for pain. You may also intermittently have blood in the urine until the stent is removed.       4.   You may discontinue nitrofurantoin in 3 days.

## 2018-12-03 NOTE — Addendum Note (Signed)
Addendum  created 12/03/18 1333 by Myna Bright, CRNA   Intraprocedure Meds edited

## 2018-12-03 NOTE — Anesthesia Postprocedure Evaluation (Signed)
Anesthesia Post Note  Patient: [name]  Procedure(s) Performed: CYSTOSCOPY WITH STENT CHANGE (Left Urethra)     Patient location during evaluation: PACU Anesthesia Type: General Level of consciousness: awake and alert Pain management: pain level controlled Vital Signs Assessment: post-procedure vital signs reviewed and stable Respiratory status: spontaneous breathing, nonlabored ventilation, respiratory function stable and patient connected to nasal cannula oxygen Cardiovascular status: blood pressure returned to baseline and stable Postop Assessment: no apparent nausea or vomiting Anesthetic complications: no    Last Vitals:  Vitals:   12/03/18 0906 12/03/18 1130  BP: (!) 148/67 (!) 145/68  Pulse: 63 (!) 54  Resp: 18 15  Temp: 36.8 C 36.7 C  SpO2: 98% 98%    Last Pain:  Vitals:   12/03/18 1130  TempSrc:   PainSc: 0-No pain                 [name] S

## 2018-12-03 NOTE — Anesthesia Preprocedure Evaluation (Signed)
Anesthesia Evaluation  Patient identified by MRN, date of birth, ID band Patient awake    Reviewed: Allergy & Precautions, NPO status , Patient's Chart, lab work & pertinent test results  Airway Mallampati: II  TM Distance: >3 FB Neck ROM: Full    Dental no notable dental hx.    Pulmonary neg pulmonary ROS,    Pulmonary exam normal breath sounds clear to auscultation       Cardiovascular hypertension, Normal cardiovascular exam Rhythm:Regular Rate:Normal     Neuro/Psych negative neurological ROS  negative psych ROS   GI/Hepatic negative GI ROS, Neg liver ROS,   Endo/Other  negative endocrine ROS  Renal/GU negative Renal ROS  negative genitourinary   Musculoskeletal negative musculoskeletal ROS (+)   Abdominal   Peds negative pediatric ROS (+)  Hematology negative hematology ROS (+)   Anesthesia Other Findings   Reproductive/Obstetrics negative OB ROS                             Anesthesia Physical Anesthesia Plan  ASA: III  Anesthesia Plan: General   Post-op Pain Management:    Induction: Intravenous  PONV Risk Score and Plan: 3 and Ondansetron, Dexamethasone and Treatment may vary due to age or medical condition  Airway Management Planned: LMA  Additional Equipment:   Intra-op Plan:   Post-operative Plan: Extubation in OR  Informed Consent: I have reviewed the patients History and Physical, chart, labs and discussed the procedure including the risks, benefits and alternatives for the proposed anesthesia with the patient or authorized representative who has indicated his/her understanding and acceptance.     Dental advisory given  Plan Discussed with: CRNA and Surgeon  Anesthesia Plan Comments:         Anesthesia Quick Evaluation

## 2018-12-03 NOTE — Transfer of Care (Signed)
Immediate Anesthesia Transfer of Care Note  Patient: [name]  Procedure(s) Performed: CYSTOSCOPY WITH STENT CHANGE (Left Urethra)  Patient Location: PACU  Anesthesia Type:General  Level of Consciousness: awake, alert , oriented and patient cooperative  Airway & Oxygen Therapy: Patient Spontanous Breathing and Patient connected to face mask oxygen  Post-op Assessment: Report given to RN, Post -op Vital signs reviewed and stable and Patient moving all extremities  Post vital signs: Reviewed and stable  Last Vitals:  Vitals Value Taken Time  BP    Temp    Pulse    Resp    SpO2      Last Pain:  Vitals:   12/03/18 0925  TempSrc:   PainSc: 0-No pain      Patients Stated Pain Goal: 4 (23/34/35 6861)  Complications: No apparent anesthesia complications

## 2018-12-04 ENCOUNTER — Encounter (HOSPITAL_COMMUNITY): Payer: Self-pay | Admitting: Urology

## 2018-12-10 ENCOUNTER — Encounter: Payer: Medicare HMO | Admitting: Gastroenterology

## 2018-12-18 ENCOUNTER — Other Ambulatory Visit: Payer: Self-pay

## 2018-12-19 ENCOUNTER — Encounter: Payer: Self-pay | Admitting: Physician Assistant

## 2018-12-19 ENCOUNTER — Ambulatory Visit (INDEPENDENT_AMBULATORY_CARE_PROVIDER_SITE_OTHER): Payer: Medicare HMO | Admitting: Physician Assistant

## 2018-12-19 VITALS — BP 134/76 | HR 67 | Temp 96.2°F | Ht 60.0 in | Wt 186.8 lb

## 2018-12-19 DIAGNOSIS — I10 Essential (primary) hypertension: Secondary | ICD-10-CM

## 2018-12-19 DIAGNOSIS — G8929 Other chronic pain: Secondary | ICD-10-CM | POA: Diagnosis not present

## 2018-12-19 DIAGNOSIS — Z6835 Body mass index (BMI) 35.0-35.9, adult: Secondary | ICD-10-CM | POA: Diagnosis not present

## 2018-12-19 DIAGNOSIS — M5440 Lumbago with sciatica, unspecified side: Secondary | ICD-10-CM | POA: Diagnosis not present

## 2018-12-19 DIAGNOSIS — M546 Pain in thoracic spine: Secondary | ICD-10-CM

## 2018-12-19 MED ORDER — PREGABALIN 50 MG PO CAPS: 50.0000 mg | ORAL_CAPSULE | Freq: Three times a day (TID) | ORAL | 2 refills | Status: DC

## 2018-12-19 MED ORDER — OXYCODONE-ACETAMINOPHEN 7.5-325 MG PO TABS: 1.0000 | ORAL_TABLET | Freq: Three times a day (TID) | ORAL | 0 refills | Status: DC | PRN

## 2018-12-19 NOTE — Progress Notes (Signed)
BP 134/76   Pulse 67   Temp (!) 96.2 F (35.7 C) (Temporal)   Ht 5' (1.524 m)   Wt 186 lb 12.8 oz (84.7 kg)   BMI 36.48 kg/m    Subjective:    Patient ID: [name], female    DOB: [birth year], 73 y.o.   MRN: [id number]  HPI: [name] is a 73 y.o. female presenting on 12/19/2018 for Medical Management of Chronic Issues (6 month ) and Hypertension  This patient comes in for follow-up on her regular chronic conditions.  She does have hypertension, fatigue, multiple arthritis is and degenerative spine.  She does have some probably from the damage to her spine.  Patient also has significant urinary tract symptoms.  She still under the care of urology.  PAIN ASSESSMENT: Cause of pain-degenerative disc disease with osteophyte formation from thoracic and lumbar spines.  Severe degenerative disc disease  2020: Osteophytes fuse almost the entire thoracic spine with chronic severe degenerative disc disease at T12-L1. Progressive severe chronic degenerative disc disease at T12-L1 and L1-2 with probable fusing osteophytes anteriorly at T12-L1.  Solid fusion from L2-L5.  This patient returns for a visit on narcotic use for the above named conditions The patient has been going to Dr. Brien Few for some time.  She is more time getting patient to be able to go to this appointment.  It is every 3 months.  I reviewed the PDMP and this does show to be true.  Current medications-Percocet 5 mg, will change to 7.5 mg 3 times a day. Robaxin 500 mg 1 at bedtime Gabapentin has been making her feel confused so she has discontinued it in the symptoms.  We will trial Lyrica with titration this month from 50 mg up to 150 mg. Medication side effects-none Any concerns-warnings about side effects, somnolence  Pain on scale of 1-02-13-09 Frequency-Daily What increases pain-walking What makes pain Better-very low Effects on ADL -moderate some days Any change in general medical condition-none   Effectiveness of current meds-adequate Adverse reactions form pain meds-no PMP AWARE website reviewed: Yes Any suspicious activity on PMP Aware: No MME daily dose: 30. Now 33.75  Contract on file 12/19/2018 Last UDS 12/19/2018  History of overdose or risk of abuse no  Past Medical History:  Diagnosis Date  . Allergy   . Arthritis   . Cataract    removed both eyes  . Chronic constipation   . Chronic midline low back pain with sciatica   . Endometrioid adenocarcinoma   . Full dentures   . History of endometrial cancer 2012   s/p   TAH w/ BSO 11-02-2010  . History of recurrent UTIs   . History of sepsis    multiple times (some due to e.coli) last sepsis 07/ 2019  . History of small bowel obstruction    x2  ,  hx bowel resection  . HOH (hard of hearing)   . Hyperlipidemia   . Hypertension   . Medical history reviewed with no changes 11/30/2018  . Retroperitoneal fibrosis   . Ureteral obstruction, left    secondary to adhesions/ retroperitoneal fibrosis  . Vertigo   . Wears glasses    Relevant past medical, surgical, family and social history reviewed and updated as indicated. Interim medical history since our last visit reviewed. Allergies and medications reviewed and updated. DATA REVIEWED: CHART IN EPIC  Family History reviewed for pertinent findings.  Review of Systems  Constitutional: Negative.   HENT: Negative.  Eyes: Negative.   Respiratory: Negative.   Gastrointestinal: Negative.   Genitourinary: Negative.     Allergies as of 12/19/2018      Reactions   Cashew Nut Oil Hives   Dog Epithelium Hives, Itching   Ondansetron Hcl Nausea And Vomiting   "made me throw up"   Sulfa Antibiotics    "blistering lips and mouth sores"      Medication List       Accurate as of December 19, 2018 11:46 AM. If you have any questions, ask your nurse or doctor.        STOP taking these medications   fluticasone 50 MCG/ACT nasal spray Commonly known as: FLONASE  Stopped by: Terald Sleeper, PA-C   gabapentin 300 MG capsule Commonly known as: NEURONTIN Stopped by: Terald Sleeper, PA-C   meclizine 25 MG tablet Commonly known as: ANTIVERT Stopped by: Terald Sleeper, PA-C   meloxicam 15 MG tablet Commonly known as: MOBIC Stopped by: Terald Sleeper, PA-C   nystatin ointment Commonly known as: MYCOSTATIN Stopped by: Terald Sleeper, PA-C   oxyCODONE-acetaminophen 5-325 MG tablet Commonly known as: PERCOCET/ROXICET Replaced by: oxyCODONE-acetaminophen 7.5-325 MG tablet Stopped by: Terald Sleeper, PA-C     TAKE these medications   amitriptyline 25 MG tablet Commonly known as: ELAVIL Take 1 tablet (25 mg total) by mouth at bedtime.   EX-LAX PO Take 1 tablet by mouth 2 (two) times a week.   lisinopril-hydrochlorothiazide 10-12.5 MG tablet Commonly known as: ZESTORETIC Take 1 tablet by mouth daily.   methocarbamol 500 MG tablet Commonly known as: ROBAXIN Take 500 mg by mouth at bedtime.   nitrofurantoin 50 MG capsule Commonly known as: MACRODANTIN Take 1 capsule (50 mg total) by mouth at bedtime. Begin after completing current course of antibiotics.   oxyCODONE-acetaminophen 7.5-325 MG tablet Commonly known as: Percocet Take 1 tablet by mouth every 8 (eight) hours as needed for severe pain. Replaces: oxyCODONE-acetaminophen 5-325 MG tablet Started by: Terald Sleeper, PA-C   oxyCODONE-acetaminophen 7.5-325 MG tablet Commonly known as: Percocet Take 1 tablet by mouth every 8 (eight) hours as needed for severe pain. Started by: Terald Sleeper, PA-C   oxyCODONE-acetaminophen 7.5-325 MG tablet Commonly known as: Percocet Take 1 tablet by mouth every 8 (eight) hours as needed for severe pain. Started by: Terald Sleeper, PA-C   pregabalin 50 MG capsule Commonly known as: LYRICA Take 1 capsule (50 mg total) by mouth 3 (three) times daily. Started by: Terald Sleeper, PA-C          Objective:    BP 134/76   Pulse 67   Temp (!) 96.2  F (35.7 C) (Temporal)   Ht 5' (1.524 m)   Wt 186 lb 12.8 oz (84.7 kg)   BMI 36.48 kg/m   Allergies  Allergen Reactions  . Cashew Nut Oil Hives  . Dog Epithelium Hives and Itching  . Ondansetron Hcl Nausea And Vomiting    "made me throw up"  . Sulfa Antibiotics     "blistering lips and mouth sores"    Wt Readings from Last 3 Encounters:  12/19/18 186 lb 12.8 oz (84.7 kg)  12/03/18 188 lb 6 oz (85.4 kg)  11/30/18 188 lb 6 oz (85.4 kg)    Physical Exam Constitutional:      General: She is not in acute distress.    Appearance: Normal appearance. She is well-developed.  HENT:     Head: Normocephalic and atraumatic.  Cardiovascular:  Rate and Rhythm: Normal rate.  Pulmonary:     Effort: Pulmonary effort is normal.  Skin:    General: Skin is warm and dry.     Findings: No rash.  Neurological:     Mental Status: She is alert and oriented to person, place, and time.     Deep Tendon Reflexes: Reflexes are normal and symmetric.         Assessment & Plan:   1. Chronic midline low back pain with sciatica, sciatica laterality unspecified - pregabalin (LYRICA) 50 MG capsule; Take 1 capsule (50 mg total) by mouth 3 (three) times daily.  Dispense: 90 capsule; Refill: 2 - ToxASSURE Select 13 (MW), Urine - oxyCODONE-acetaminophen (PERCOCET) 7.5-325 MG tablet; Take 1 tablet by mouth every 8 (eight) hours as needed for severe pain.  Dispense: 90 tablet; Refill: 0 - oxyCODONE-acetaminophen (PERCOCET) 7.5-325 MG tablet; Take 1 tablet by mouth every 8 (eight) hours as needed for severe pain.  Dispense: 30 tablet; Refill: 0 - oxyCODONE-acetaminophen (PERCOCET) 7.5-325 MG tablet; Take 1 tablet by mouth every 8 (eight) hours as needed for severe pain.  Dispense: 90 tablet; Refill: 0  2. Chronic midline thoracic back pain - pregabalin (LYRICA) 50 MG capsule; Take 1 capsule (50 mg total) by mouth 3 (three) times daily.  Dispense: 90 capsule; Refill: 2 - ToxASSURE Select 13 (MW), Urine -  oxyCODONE-acetaminophen (PERCOCET) 7.5-325 MG tablet; Take 1 tablet by mouth every 8 (eight) hours as needed for severe pain.  Dispense: 90 tablet; Refill: 0 - oxyCODONE-acetaminophen (PERCOCET) 7.5-325 MG tablet; Take 1 tablet by mouth every 8 (eight) hours as needed for severe pain.  Dispense: 30 tablet; Refill: 0 - oxyCODONE-acetaminophen (PERCOCET) 7.5-325 MG tablet; Take 1 tablet by mouth every 8 (eight) hours as needed for severe pain.  Dispense: 90 tablet; Refill: 0  3. Essential hypertension - TSH - Vitamin B12 - CBC with Differential/Platelet - VITAMIN D 25 Hydroxy (Vit-D Deficiency, Fractures)  4. Class 2 severe obesity with serious comorbidity and body mass index (BMI) of 35.0 to 35.9 in adult, unspecified obesity type (HCC) - TSH - Vitamin B12 - CBC with Differential/Platelet - VITAMIN D 25 Hydroxy (Vit-D Deficiency, Fractures)   Continue all other maintenance medications as listed above.  Follow up plan: Return in about 3 months (around 03/21/2019) for follow up pain meds.  Educational handout given for [place] PA-C [place] [address]  [place], [place] [address] [phone]   12/19/2018, 11:46 AM

## 2018-12-19 NOTE — Patient Instructions (Signed)
In a few days you may receive a survey in the mail or online from [employer] regarding your visit with us today. Please take a moment to fill this out. Your feedback is very important to our whole office. It can help us better understand your needs as well as improve your experience and satisfaction. Thank you for taking your time to complete it. We care about you.  Dejanay Wamboldt, PA-C  

## 2018-12-20 ENCOUNTER — Telehealth: Payer: Self-pay | Admitting: *Deleted

## 2018-12-20 NOTE — Telephone Encounter (Addendum)
Prior Auth for Pregabalin 50mg  Capsules-APPROVED   Coverage Starts on: 05/07/2018, Coverage Ends on: 05/09/2019.  Key: [id number]   PA Case ID: [id number]    [employer] has not yet replied to your PA request. You may close this dialog, return to your dashboard, and perform other tasks.  To check for an update later, open this request again from your dashboard.  If [employer] has not replied to your request within 24 hours please contact [employer] at [phone].

## 2018-12-21 ENCOUNTER — Telehealth: Payer: Self-pay | Admitting: Gastroenterology

## 2018-12-21 NOTE — Telephone Encounter (Signed)

## 2018-12-22 LAB — CBC WITH DIFFERENTIAL/PLATELET
Basophils Absolute: 0 10*3/uL (ref 0.0–0.2)
Basos: 0 %
EOS (ABSOLUTE): 0.2 10*3/uL (ref 0.0–0.4)
Eos: 2 %
Hematocrit: 42.9 % (ref 34.0–46.6)
Hemoglobin: 15.3 g/dL (ref 11.1–15.9)
Immature Grans (Abs): 0 10*3/uL (ref 0.0–0.1)
Immature Granulocytes: 0 %
Lymphocytes Absolute: 2.9 10*3/uL (ref 0.7–3.1)
Lymphs: 34 %
MCH: 32.8 pg (ref 26.6–33.0)
MCHC: 35.7 g/dL (ref 31.5–35.7)
MCV: 92 fL (ref 79–97)
Monocytes Absolute: 0.6 10*3/uL (ref 0.1–0.9)
Monocytes: 7 %
Neutrophils Absolute: 5 10*3/uL (ref 1.4–7.0)
Neutrophils: 57 %
Platelets: 290 10*3/uL (ref 150–450)
RBC: 4.66 x10E6/uL (ref 3.77–5.28)
RDW: 12.3 % (ref 11.7–15.4)
WBC: 8.6 10*3/uL (ref 3.4–10.8)

## 2018-12-22 LAB — TOXASSURE SELECT 13 (MW), URINE

## 2018-12-22 LAB — TSH: TSH: 0.994 u[IU]/mL (ref 0.450–4.500)

## 2018-12-22 LAB — VITAMIN B12: Vitamin B-12: 411 pg/mL (ref 232–1245)

## 2018-12-22 LAB — VITAMIN D 25 HYDROXY (VIT D DEFICIENCY, FRACTURES): Vit D, 25-Hydroxy: 25.7 ng/mL — ABNORMAL LOW (ref 30.0–100.0)

## 2018-12-24 ENCOUNTER — Ambulatory Visit (AMBULATORY_SURGERY_CENTER): Payer: Medicare HMO | Admitting: Gastroenterology

## 2018-12-24 ENCOUNTER — Encounter: Payer: Self-pay | Admitting: Gastroenterology

## 2018-12-24 ENCOUNTER — Other Ambulatory Visit: Payer: Self-pay

## 2018-12-24 VITALS — BP 147/62 | HR 61 | Temp 97.3°F | Resp 13 | Ht 60.0 in | Wt 186.0 lb

## 2018-12-24 DIAGNOSIS — Z1211 Encounter for screening for malignant neoplasm of colon: Secondary | ICD-10-CM | POA: Diagnosis not present

## 2018-12-24 DIAGNOSIS — D122 Benign neoplasm of ascending colon: Secondary | ICD-10-CM

## 2018-12-24 DIAGNOSIS — Z800 Family history of malignant neoplasm of digestive organs: Secondary | ICD-10-CM

## 2018-12-24 MED ORDER — SODIUM CHLORIDE 0.9 % IV SOLN: 500.0000 mL | Freq: Once | INTRAVENOUS | Status: DC

## 2018-12-24 NOTE — Progress Notes (Signed)
Called to room to assist during endoscopic procedure.  Patient ID and intended procedure confirmed with present staff. Received instructions for my participation in the procedure from the performing physician.  

## 2018-12-24 NOTE — Op Note (Signed)
[facility] Patient Name: [name] Procedure Date: 12/24/2018 1:04 PM MRN: [id number] Endoscopist: Mallie Mussel L. Loletha Carrow , MD Age: 73 Referring MD:  Date of Birth: Feb 10, 1946 Gender: Female Account #: [id number] Procedure:                Colonoscopy Indications:              Screening in patient at increased risk: Colorectal                            cancer in sister before age 97 (last colonoscopy                            2010) Medicines:                Monitored Anesthesia Care Procedure:                Pre-Anesthesia Assessment:                           - Prior to the procedure, a History and Physical                            was performed, and patient medications and                            allergies were reviewed. The patient's tolerance of                            previous anesthesia was also reviewed. The risks                            and benefits of the procedure and the sedation                            options and risks were discussed with the patient.                            All questions were answered, and informed consent                            was obtained. Prior Anticoagulants: The patient has                            taken no previous anticoagulant or antiplatelet                            agents. ASA Grade Assessment: III - A patient with                            severe systemic disease. After reviewing the risks                            and benefits, the patient was deemed in  satisfactory condition to undergo the procedure.                           After obtaining informed consent, the colonoscope                            was passed under direct vision. Throughout the                            procedure, the patient's blood pressure, pulse, and                            oxygen saturations were monitored continuously. The                            Colonoscope was introduced through the anus and                             advanced to the the ileocecal valve. The                            colonoscopy was performed with difficulty due to                            significant looping and the patient's body habitus.                            The patient tolerated the procedure well. The                            quality of the bowel preparation was good. The                            ileocecal valve and the rectum were photographed. Scope In: 1:11:21 PM Scope Out: 1:40:07 PM Scope Withdrawal Time: 0 hours 16 minutes 24 seconds  Total Procedure Duration: 0 hours 28 minutes 46 seconds  Findings:                 The perianal and digital rectal examinations were                            normal.                           A very large polyp was found in the proximal                            ascending colon. The polyp was sessile, occupying                            about 2/3 the colon circumference, perhaps 6 cm in                            greatest dimension. It was difficult to completely  visualize due to size, location, and scope looping.                            Biopsies were taken with a cold forceps for                            histology. Area was tattooed with an injection of 1                            mL of Spot (two injections each of 0.40ml on                            opposite walls 7-10 cm distal to the larger polyp.                            ) There was another small sessile polyp just distal                            to the large polyp. The tattoo was placed distal to                            both of them.                           Note above - the cecum could not be reached.                           The colon (entire examined portion) was                            significantly redundant.                           The exam was otherwise without abnormality on                            direct and retroflexion  views. Complications:            No immediate complications. Estimated Blood Loss:     Estimated blood loss was minimal. Impression:               - One large polyp in the proximal ascending colon.                            Biopsied. Tattooed.                           - Redundant colon.                           - The examination was otherwise normal on direct                            and retroflexion views.  The large polyp is not amenable to endoscopic                            removal and will most likely require surgical                            resection. Recommendation:           - Patient has a contact number available for                            emergencies. The signs and symptoms of potential                            delayed complications were discussed with the                            patient. Return to normal activities tomorrow.                            Written discharge instructions were provided to the                            patient.                           - Resume previous diet.                           - Continue present medications.                           - Await pathology results.                           - No recommendation at this time regarding repeat                            colonoscopy. Henry L. Loletha Carrow, MD 12/24/2018 1:56:49 PM This report has been signed electronically.

## 2018-12-24 NOTE — Progress Notes (Signed)
PT taken to PACU. Monitors in place. VSS. Report given to RN. 

## 2018-12-24 NOTE — Progress Notes (Signed)
Pt's states no medical or surgical changes since previsit or office visit.  JB temps and CW vitals. 

## 2018-12-24 NOTE — Patient Instructions (Signed)
Please read handouts provided. Await pathology results. Continue present medications.      YOU HAD AN ENDOSCOPIC PROCEDURE TODAY AT THE [facility]:   Refer to the procedure report that was given to you for any specific questions about what was found during the examination.  If the procedure report does not answer your questions, please call your gastroenterologist to clarify.  If you requested that your care partner not be given the details of your procedure findings, then the procedure report has been included in a sealed envelope for you to review at your convenience later.  YOU SHOULD EXPECT: Some feelings of bloating in the abdomen. Passage of more gas than usual.  Walking can help get rid of the air that was put into your GI tract during the procedure and reduce the bloating. If you had a lower endoscopy (such as a colonoscopy or flexible sigmoidoscopy) you may notice spotting of blood in your stool or on the toilet paper. If you underwent a bowel prep for your procedure, you may not have a normal bowel movement for a few days.  Please Note:  You might notice some irritation and congestion in your nose or some drainage.  This is from the oxygen used during your procedure.  There is no need for concern and it should clear up in a day or so.  SYMPTOMS TO REPORT IMMEDIATELY:   Following lower endoscopy (colonoscopy or flexible sigmoidoscopy):  Excessive amounts of blood in the stool  Significant tenderness or worsening of abdominal pains  Swelling of the abdomen that is new, acute  Fever of 100F or higher    For urgent or emergent issues, a gastroenterologist can be reached at any hour by calling [phone].   DIET:  We do recommend a small meal at first, but then you may proceed to your regular diet.  Drink plenty of fluids but you should avoid alcoholic beverages for 24 hours.  ACTIVITY:  You should plan to take it easy for the rest of today and you should NOT  DRIVE or use heavy machinery until tomorrow (because of the sedation medicines used during the test).    FOLLOW UP: Our staff will call the number listed on your records 48-72 hours following your procedure to check on you and address any questions or concerns that you may have regarding the information given to you following your procedure. If we do not reach you, we will leave a message.  We will attempt to reach you two times.  During this call, we will ask if you have developed any symptoms of COVID 19. If you develop any symptoms (ie: fever, flu-like symptoms, shortness of breath, cough etc.) before then, please call [phone].  If you test positive for Covid 19 in the 2 weeks post procedure, please call and report this information to us.    If any biopsies were taken you will be contacted by phone or by letter within the next 1-3 weeks.  Please call us at [phone] if you have not heard about the biopsies in 3 weeks.    SIGNATURES/CONFIDENTIALITY: You and/or your care partner have signed paperwork which will be entered into your electronic medical record.  These signatures attest to the fact that that the information above on your After Visit Summary has been reviewed and is understood.  Full responsibility of the confidentiality of this discharge information lies with you and/or your care-partner. 

## 2018-12-26 ENCOUNTER — Telehealth: Payer: Self-pay | Admitting: *Deleted

## 2018-12-26 NOTE — Telephone Encounter (Signed)
1. Have you developed a fever since your procedure? no  2.   Have you had an respiratory symptoms (SOB or cough) since your procedure? no  3.   Have you tested positive for COVID 19 since your procedure no  4.   Have you had any family members/close contacts diagnosed with the COVID 19 since your procedure?  no   If yes to any of these questions please route to Joylene John, RN and Alphonsa Gin, Therapist, sports.  Follow up Call-  Call back number 12/24/2018  Post procedure Call Back phone  # [phone]  Permission to leave phone message Yes  Some recent data might be hidden     Patient questions:  Do you have a fever, pain , or abdominal swelling? No. Pain Score  0 *  Have you tolerated food without any problems? Yes.    Have you been able to return to your normal activities? Yes.    Do you have any questions about your discharge instructions: Diet   No. Medications  No. Follow up visit  No.  Do you have questions or concerns about your Care? No.  Actions: * If pain score is 4 or above: No action needed, pain <4.

## 2018-12-28 ENCOUNTER — Encounter: Payer: Self-pay | Admitting: Gastroenterology

## 2018-12-28 ENCOUNTER — Encounter: Payer: Self-pay | Admitting: *Deleted

## 2019-01-09 ENCOUNTER — Telehealth: Payer: Self-pay | Admitting: Gastroenterology

## 2019-01-09 NOTE — Telephone Encounter (Signed)
Pt requested a call back to discuss colon results.

## 2019-01-09 NOTE — Telephone Encounter (Signed)
Spoke to the patient who wanted to know if she would wait for her possible polyp removal could wait until next year. This RN told the patient that she should wait until she had her consultation with the [facility] surgeon to make that determination. This RN discussed with the patient that she should have her initial consultation and that I would continue to follow up with [facility] to make sure she was scheduled. Patient verbalized understanding. No other questions or concerns voiced at the time of the call.

## 2019-01-15 DIAGNOSIS — Z961 Presence of intraocular lens: Secondary | ICD-10-CM | POA: Diagnosis not present

## 2019-01-15 DIAGNOSIS — H353132 Nonexudative age-related macular degeneration, bilateral, intermediate dry stage: Secondary | ICD-10-CM | POA: Diagnosis not present

## 2019-01-29 ENCOUNTER — Telehealth: Payer: Self-pay | Admitting: Gastroenterology

## 2019-01-29 NOTE — Telephone Encounter (Signed)
Agree - not residual effect from a procedure a month ago.  Gas-X  Miralax if having any trouble moving bowels.  Please make sure she has seen the surgeon for the large colon polyp that needs resection.

## 2019-01-29 NOTE — Telephone Encounter (Signed)
Dr Loletha Carrow patient. Colonoscopy was on 12-24-2018

## 2019-01-29 NOTE — Telephone Encounter (Signed)
The patient is stating that she still has gas from the colonoscopy that was performed on 12/24/2018. I explained that since her colonoscopy was more than 1 month ago, her gas could not be from the colonoscopy. The patient is convinced this is not true and want to be advised of what she could take for gas. Please advise.

## 2019-01-29 NOTE — Telephone Encounter (Signed)
I advised the pt that Dr Loletha Carrow recommends gas x and miralax if having any trouble moving her bowels.  I also asked if she had gotten an appt with the surgeons office.  She states she has not heard from [facility] regarding appt.  Brittani have you heard from [facility] about the appt?  I did call and left a message for Judson Roch to return call to inquire about appt date and time.

## 2019-01-30 NOTE — Telephone Encounter (Signed)
FYI- Followed up with [facility], per [facility], the patient has been scheduled to see Dr. Johney Maine on 02/11/2019 at 10:30 am.  Left detailed message on machine if the patient had not yet been contacted by [facility].

## 2019-02-05 ENCOUNTER — Telehealth: Payer: Self-pay | Admitting: Physician Assistant

## 2019-02-05 NOTE — Telephone Encounter (Signed)
Aug 12 visit:  Medication Changes    Pregabalin 50 mg Oral 3 times daily   oxyCODONE-Acetaminophen        7.5-325 MG Tabs, 1 tablet Oral Every 8 hours PRN   7.5-325 MG Tabs, 1 tablet Oral Every 8 hours PRN   7.5-325 MG Tabs, 1 tablet Oral Every 8 hours PRN   She should have 8/12, 9/11 and 02/16/19 available. They were sent to the pharmacy.   She can have her appt moved up to 11/9 or before so they we stay on the 3 month protocol for controlled meds.

## 2019-02-05 NOTE — Telephone Encounter (Signed)
Patient aware.

## 2019-02-07 ENCOUNTER — Telehealth: Payer: Self-pay | Admitting: Physician Assistant

## 2019-02-07 NOTE — Telephone Encounter (Signed)
Spoke with [employer].  Patient picked up a 10 day supply on 02/02/2019 and will be due for next refill on 02/11/2019 patient aware.

## 2019-02-11 ENCOUNTER — Other Ambulatory Visit: Payer: Self-pay | Admitting: Physician Assistant

## 2019-02-11 ENCOUNTER — Telehealth: Payer: Self-pay | Admitting: Physician Assistant

## 2019-02-11 DIAGNOSIS — G8929 Other chronic pain: Secondary | ICD-10-CM

## 2019-02-11 DIAGNOSIS — M546 Pain in thoracic spine: Secondary | ICD-10-CM

## 2019-02-11 DIAGNOSIS — M5440 Lumbago with sciatica, unspecified side: Secondary | ICD-10-CM

## 2019-02-11 MED ORDER — OXYCODONE-ACETAMINOPHEN 7.5-325 MG PO TABS: 1.0000 | ORAL_TABLET | Freq: Three times a day (TID) | ORAL | 0 refills | Status: DC | PRN

## 2019-02-11 NOTE — Telephone Encounter (Signed)
And going back and reviewing the prescriptions the second prescription was sent for only 30 tablets and not the normal 90 quantity I will send a prescription and for 60 tablets to make up the difference.  And there still should be another 1 on file for her to get filled.

## 2019-02-11 NOTE — Telephone Encounter (Signed)
The prescription for #60 tabs has been sent to [facility].

## 2019-02-11 NOTE — Telephone Encounter (Signed)
At her last visit 3 scripts were sent and were on file to fill each month. The medication has to last the whole month. If she is having an acute problem can make an appointment,   1. Chronic midline low back pain with sciatica, sciatica laterality unspecified - pregabalin (LYRICA) 50 MG capsule; Take 1 capsule (50 mg total) by mouth 3 (three) times daily.  Dispense: 90 capsule; Refill: 2 - ToxASSURE Select 13 (MW), Urine - oxyCODONE-acetaminophen (PERCOCET) 7.5-325 MG tablet; Take 1 tablet by mouth every 8 (eight) hours as needed for severe pain.  Dispense: 90 tablet; Refill: 0 - oxyCODONE-acetaminophen (PERCOCET) 7.5-325 MG tablet; Take 1 tablet by mouth every 8 (eight) hours as needed for severe pain.  Dispense: 30 tablet; Refill: 0 - oxyCODONE-acetaminophen (PERCOCET) 7.5-325 MG tablet; Take 1 tablet by mouth every 8 (eight) hours as needed for severe pain.  Dispense: 90 tablet; Refill: 0  2. Chronic midline thoracic back pain - pregabalin (LYRICA) 50 MG capsule; Take 1 capsule (50 mg total) by mouth 3 (three) times daily.  Dispense: 90 capsule; Refill: 2 - ToxASSURE Select 13 (MW), Urine - oxyCODONE-acetaminophen (PERCOCET) 7.5-325 MG tablet; Take 1 tablet by mouth every 8 (eight) hours as needed for severe pain.  Dispense: 90 tablet; Refill: 0 - oxyCODONE-acetaminophen (PERCOCET) 7.5-325 MG tablet; Take 1 tablet by mouth every 8 (eight) hours as needed for severe pain.  Dispense: 30 tablet; Refill: 0 - oxyCODONE-acetaminophen (PERCOCET) 7.5-325 MG tablet; Take 1 tablet by mouth every 8 (eight) hours as needed for severe pain.  Dispense: 90 tablet; Refill: 0

## 2019-02-11 NOTE — Telephone Encounter (Signed)
Can pain medication be done as televisit?

## 2019-02-11 NOTE — Telephone Encounter (Signed)
SECOND CALL SHE IS OUT WANTS A CALL ASAP!

## 2019-02-11 NOTE — Telephone Encounter (Signed)
Left message to please call our office.  Must move next appointment back to meet refill needs.

## 2019-02-11 NOTE — Telephone Encounter (Signed)
Pt aware.

## 2019-03-05 DIAGNOSIS — R8279 Other abnormal findings on microbiological examination of urine: Secondary | ICD-10-CM | POA: Diagnosis not present

## 2019-03-05 DIAGNOSIS — N135 Crossing vessel and stricture of ureter without hydronephrosis: Secondary | ICD-10-CM | POA: Diagnosis not present

## 2019-03-11 ENCOUNTER — Other Ambulatory Visit: Payer: Self-pay | Admitting: Urology

## 2019-03-21 ENCOUNTER — Other Ambulatory Visit: Payer: Self-pay

## 2019-03-22 ENCOUNTER — Encounter: Payer: Self-pay | Admitting: Physician Assistant

## 2019-03-22 ENCOUNTER — Ambulatory Visit (INDEPENDENT_AMBULATORY_CARE_PROVIDER_SITE_OTHER): Payer: Medicare HMO | Admitting: Physician Assistant

## 2019-03-22 DIAGNOSIS — M5440 Lumbago with sciatica, unspecified side: Secondary | ICD-10-CM | POA: Diagnosis not present

## 2019-03-22 DIAGNOSIS — G8929 Other chronic pain: Secondary | ICD-10-CM | POA: Diagnosis not present

## 2019-03-22 DIAGNOSIS — M546 Pain in thoracic spine: Secondary | ICD-10-CM | POA: Diagnosis not present

## 2019-03-22 DIAGNOSIS — Z23 Encounter for immunization: Secondary | ICD-10-CM | POA: Diagnosis not present

## 2019-03-22 MED ORDER — OXYCODONE-ACETAMINOPHEN 7.5-325 MG PO TABS: 1.0000 | ORAL_TABLET | Freq: Three times a day (TID) | ORAL | 0 refills | Status: DC | PRN

## 2019-03-29 MED ORDER — OXYCODONE-ACETAMINOPHEN 7.5-325 MG PO TABS: 1.0000 | ORAL_TABLET | Freq: Three times a day (TID) | ORAL | 0 refills | Status: DC | PRN

## 2019-03-29 NOTE — Progress Notes (Addendum)
BP 133/73   Pulse 63   Temp (!) 96.6 F (35.9 C) (Temporal)   Ht 5' (1.524 m)   Wt 197 lb 6.4 oz (89.5 kg)   SpO2 96%   BMI 38.55 kg/m    Subjective:    Patient ID: [name], female    DOB: 01-02-1946, 73 y.o.   MRN: [id number]  HPI: [name] is a 73 y.o. female presenting on 03/22/2019 for Medical Management of Chronic Issues (3 month )  This patient comes in for periodic recheck on her chronic medical conditions they do include arthritis at multiple joints, arthritis of the lumbar spine, chronic low back pain with sciatica, peripheral neuropathy, hypertension, history of urinary tract infection, pyelonephritis, history of endometrial cancer, depression.  The patient reports that she does get injections through Dr. Brien Few, her neurosurgeon.  She states that it will help for a while but not completely.  She deftly wants to avoid any future surgeries.  At this time she does take Elavil, Robaxin on an as-needed basis, she has discontinued Lyrica because of significant weight gain.  PAIN ASSESSMENT: Cause of pain-degenerative disc disease with osteophyte formation from thoracic and lumbar spines.  Severe degenerative disc disease  2020: Osteophytes fuse almost the entire thoracic spine with chronic severe degenerative disc disease at T12-L1. Progressive severe chronic degenerative disc disease at T12-L1 and L1-2 with probable fusing osteophytes anteriorly at T12-L1.  Solid fusion from L2-L5.  This patient returns for a visit on narcotic use for the above named conditions The patient has been going to Dr. Brien Few for some time.  She is more time getting patient to be able to go to this appointment.  It is every 3 months.  I reviewed the PDMP and this does show to be true.  Current medications-Percocet 7.5 mg 3 times a day. Robaxin 500 mg 1 at bedtime Gabapentin: confusion  Stop Lyrica: weight gain Medication side effects-none Any concerns-warnings about side  effects, somnolence  Pain on scale of 1-02-13-09 Frequency-Daily What increases pain-walking What makes pain Better-very low Effects on ADL -moderate some days Any change in general medical condition-none  Effectiveness of current meds-adequate Adverse reactions form pain meds-no PMP AWARE website reviewed: Yes Any suspicious activity on PMP Aware: No MME daily dose: 33.75  Contract on file 12/19/2018 Last UDS 12/19/2018  History of overdose or risk of abuse no  Past Medical History:  Diagnosis Date  . Allergy   . Arthritis   . Cataract    removed both eyes  . Chronic constipation   . Chronic midline low back pain with sciatica   . Endometrioid adenocarcinoma   . Full dentures   . History of endometrial cancer 2012   s/p   TAH w/ BSO 11-02-2010  . History of recurrent UTIs   . History of sepsis    multiple times (some due to e.coli) last sepsis 07/ 2019  . History of small bowel obstruction    x2  ,  hx bowel resection  . HOH (hard of hearing)   . Hyperlipidemia   . Hypertension   . Medical history reviewed with no changes 11/30/2018  . Retroperitoneal fibrosis   . Ureteral obstruction, left    secondary to adhesions/ retroperitoneal fibrosis  . Vertigo   . Wears glasses    Relevant past medical, surgical, family and social history reviewed and updated as indicated. Interim medical history since our last visit reviewed. Allergies and medications reviewed and updated. DATA  REVIEWED: CHART IN EPIC  Family History reviewed for pertinent findings.  Review of Systems  Constitutional: Negative.   HENT: Negative.   Eyes: Negative.   Respiratory: Negative.   Gastrointestinal: Negative.   Genitourinary: Negative.   Musculoskeletal: Positive for arthralgias, back pain, joint swelling and myalgias.    Allergies as of 03/22/2019      Reactions   Cashew Nut Oil Hives   Dog Epithelium Hives, Itching   Lyrica [pregabalin]    Weight gain   Ondansetron Hcl Nausea  And Vomiting   "made me throw up"   Sulfa Antibiotics    "blistering lips and mouth sores"      Medication List       Accurate as of March 22, 2019 11:59 PM. If you have any questions, ask your nurse or doctor.        STOP taking these medications   nitrofurantoin 50 MG capsule Commonly known as: MACRODANTIN Stopped by: Terald Sleeper, PA-C   pregabalin 50 MG capsule Commonly known as: LYRICA Stopped by: Terald Sleeper, PA-C     TAKE these medications   amitriptyline 25 MG tablet Commonly known as: ELAVIL Take 1 tablet (25 mg total) by mouth at bedtime.   EX-LAX PO Take 1 tablet by mouth 2 (two) times a week.   lisinopril-hydrochlorothiazide 10-12.5 MG tablet Commonly known as: ZESTORETIC Take 1 tablet by mouth daily.   methocarbamol 500 MG tablet Commonly known as: ROBAXIN Take 500 mg by mouth at bedtime.   oxyCODONE-acetaminophen 7.5-325 MG tablet Commonly known as: Percocet Take 1 tablet by mouth every 8 (eight) hours as needed for severe pain. What changed: Another medication with the same name was added. Make sure you understand how and when to take each. Changed by: Terald Sleeper, PA-C   oxyCODONE-acetaminophen 7.5-325 MG tablet Commonly known as: Percocet Take 1 tablet by mouth every 8 (eight) hours as needed for severe pain. What changed: You were already taking a medication with the same name, and this prescription was added. Make sure you understand how and when to take each. Changed by: Terald Sleeper, PA-C   oxyCODONE-acetaminophen 7.5-325 MG tablet Commonly known as: Percocet Take 1 tablet by mouth every 8 (eight) hours as needed for severe pain. What changed: You were already taking a medication with the same name, and this prescription was added. Make sure you understand how and when to take each. Changed by: Terald Sleeper, PA-C          Objective:    BP 133/73   Pulse 63   Temp (!) 96.6 F (35.9 C) (Temporal)   Ht 5' (1.524 m)   Wt  197 lb 6.4 oz (89.5 kg)   SpO2 96%   BMI 38.55 kg/m   Allergies  Allergen Reactions  . Cashew Nut Oil Hives  . Dog Epithelium Hives and Itching  . Lyrica [Pregabalin]     Weight gain  . Ondansetron Hcl Nausea And Vomiting    "made me throw up"  . Sulfa Antibiotics     "blistering lips and mouth sores"    Wt Readings from Last 3 Encounters:  03/22/19 197 lb 6.4 oz (89.5 kg)  12/24/18 186 lb (84.4 kg)  12/19/18 186 lb 12.8 oz (84.7 kg)    Physical Exam Constitutional:      General: She is not in acute distress.    Appearance: Normal appearance. She is well-developed.  HENT:     Head: Normocephalic and atraumatic.  Cardiovascular:     Rate and Rhythm: Normal rate.  Pulmonary:     Effort: Pulmonary effort is normal.  Musculoskeletal:     Thoracic back: She exhibits decreased range of motion, tenderness, pain and spasm.     Lumbar back: She exhibits decreased range of motion, tenderness, pain and spasm.       Back:  Skin:    General: Skin is warm and dry.     Findings: No rash.  Neurological:     Mental Status: She is alert and oriented to person, place, and time.     Deep Tendon Reflexes: Reflexes are normal and symmetric.     Results for orders placed or performed in visit on 12/19/18  ToxASSURE Select 13 (MW), Urine  Result Value Ref Range   Summary Note   TSH  Result Value Ref Range   TSH 0.994 0.450 - 4.500 uIU/mL  Vitamin B12  Result Value Ref Range   Vitamin B-12 411 232 - 1,245 pg/mL  CBC with Differential/Platelet  Result Value Ref Range   WBC 8.6 3.4 - 10.8 x10E3/uL   RBC 4.66 3.77 - 5.28 x10E6/uL   Hemoglobin 15.3 11.1 - 15.9 g/dL   Hematocrit 42.9 34.0 - 46.6 %   MCV 92 79 - 97 fL   MCH 32.8 26.6 - 33.0 pg   MCHC 35.7 31.5 - 35.7 g/dL   RDW 12.3 11.7 - 15.4 %   Platelets 290 150 - 450 x10E3/uL   Neutrophils 57 Not Estab. %   Lymphs 34 Not Estab. %   Monocytes 7 Not Estab. %   Eos 2 Not Estab. %   Basos 0 Not Estab. %   Neutrophils Absolute  5.0 1.4 - 7.0 x10E3/uL   Lymphocytes Absolute 2.9 0.7 - 3.1 x10E3/uL   Monocytes Absolute 0.6 0.1 - 0.9 x10E3/uL   EOS (ABSOLUTE) 0.2 0.0 - 0.4 x10E3/uL   Basophils Absolute 0.0 0.0 - 0.2 x10E3/uL   Immature Granulocytes 0 Not Estab. %   Immature Grans (Abs) 0.0 0.0 - 0.1 x10E3/uL  VITAMIN D 25 Hydroxy (Vit-D Deficiency, Fractures)  Result Value Ref Range   Vit D, 25-Hydroxy 25.7 (L) 30.0 - 100.0 ng/mL      Assessment & Plan:   1. Need for immunization against influenza - Flu Vaccine QUAD High Dose(Fluad)  2. Chronic midline low back pain with sciatica, sciatica laterality unspecified - oxyCODONE-acetaminophen (PERCOCET) 7.5-325 MG tablet; Take 1 tablet by mouth every 8 (eight) hours as needed for severe pain.  Dispense: 90 tablet; Refill: 0  3. Chronic midline thoracic back pain - oxyCODONE-acetaminophen (PERCOCET) 7.5-325 MG tablet; Take 1 tablet by mouth every 8 (eight) hours as needed for severe pain.  Dispense: 90 tablet; Refill: 0   Continue all other maintenance medications as listed above.  Follow up plan: Return in about 3 months (around 06/22/2019) for recheck medications and labs.  Educational handout given for [place] PA-C [place] [address]  [place], [place] [address] [phone]   03/29/2019, 4:45 PM

## 2019-04-03 ENCOUNTER — Telehealth: Payer: Self-pay | Admitting: Physician Assistant

## 2019-04-03 ENCOUNTER — Other Ambulatory Visit: Payer: Self-pay | Admitting: Physician Assistant

## 2019-04-03 MED ORDER — OXYCODONE-ACETAMINOPHEN 7.5-325 MG PO TABS: 1.0000 | ORAL_TABLET | Freq: Three times a day (TID) | ORAL | 0 refills | Status: DC | PRN

## 2019-04-03 NOTE — Telephone Encounter (Signed)
Does just one script need to be sent?

## 2019-04-03 NOTE — Telephone Encounter (Signed)
Can this be changed? Please advise

## 2019-04-03 NOTE — Telephone Encounter (Signed)
Yes just one script

## 2019-04-03 NOTE — Telephone Encounter (Signed)
done

## 2019-04-08 ENCOUNTER — Ambulatory Visit: Payer: Self-pay | Admitting: Surgery

## 2019-04-08 DIAGNOSIS — D122 Benign neoplasm of ascending colon: Secondary | ICD-10-CM | POA: Diagnosis not present

## 2019-04-08 NOTE — H&P (Signed)
Aneya Carnero Brandenburger Documented: 04/08/2019 9:40 AM Location: [facility] Patient #: [id number] DOB: 1946-01-13 Single / Language: Cleophus Molt / Race: White Female  History of Present Illness Adin Hector MD; 04/08/2019 11:41 AM) The patient is a 73 year old female who presents with a colonic polyp. Note for "Colonic polyp": ` ` ` Patient sent for surgical consultation at the request of Dr Wilfrid Lund  Chief Complaint: Large polyp of the ascending colon. Request for surgical removal ` ` The patient is a patient has been followed by [facility] gastroenterology for colon polyps. Had a 10 yr f/ucolonoscopy in August by Dr Loletha Carrow that noted a large polyp carpeting the ascending colon. At least 6 cm. Not able to be resected endoscopically given the difficult anatomy. Biopsy showed adenoma. Surgical consultation recommended. Patient had not set appointment in September and again GI recommended setting up appointment. Patient has chronic low back pain and is on chronic narcotics. Follow-up with regular back injections. I had done urgent surgery on her in 2014 for an incarcerated hernia requiring small bowel resection. Patient developed recurrence, so I did a laparoscopic underlay repair mesh with a giant 33 cm sheet later in that year. Have not seen her since then. She had a history of an open left nephrectomy many years ago and has some chronic bulging/asymmetry on that side. She had no obvious hernias along that fold incision. No other surgery since then.  She comes today with her daughter. She wish to help take care of her since I didn't prior surgery with her. She moves her bowels about 2-3 times a week. Uses a laxative to help out. She can walk about 10-20 minutes with a cane. Can run errands. Denies any rectal bleeding. No personal nor family history of GI/colon cancer, inflammatory bowel disease, irritable bowel syndrome, allergy such as Celiac Sprue, dietary/dairy  problems, colitis, ulcers nor gastritis. No recent sick contacts/gastroenteritis. No travel outside the country. No changes in diet. No dysphagia to solids or liquids. No significant heartburn or reflux. No hematochezia, hematemesis, coffee ground emesis. No evidence of prior gastric/peptic ulceration.   (Review of systems as stated in this history (HPI) or in the review of systems. Otherwise all other 12 point ROS are negative) ` ` `  Diagnosis Surgical [P], colon, ascending, polyp - TUBULOVILLOUS ADENOMA (MULTIPLE FRAGMENTS). - NO HIGH GRADE DYSPLASIA OR MALIGNANCY. [name] MD Pathologist, Electronic Signature (Case signed 12/27/2018) Specimen Malynda Smolinski and Clinical Information Specimen Comment Family history of malignant neoplasm of gastrointestinal tract Specimen(s) Obtained: Surgical [P], colon, ascending, polyp Specimen Clinical Information R/O adenoma; benign neoplasm of ascending colon Addis Bennie Received in formalin are tan, soft tissue fragments that are submitted in toto. Number: multiple, Size: 0.2 cm smallest to 0.4 cm largest, (1 B) ( TA )   Past Surgical History [name], CMA; 04/08/2019 10:19 AM) Cataract Surgery Bilateral. Colon Polyp Removal - Colonoscopy Foot Surgery Bilateral. Hip Surgery Bilateral. Hysterectomy (due to cancer) - Complete Knee Surgery Right. Resection of Small Bowel Spinal Surgery - Lower Back Spinal Surgery Midback  Diagnostic Studies History [name], [place]; 04/08/2019 10:19 AM) Colonoscopy within last year Mammogram within last year Pap Smear >5 years ago  Allergies [name], [place]; 04/08/2019 10:21 AM) Sulfa Antibiotics Zofran *ANTIEMETICS* Allergies Reconciled  Medication History [name], CMA; 04/08/2019 10:21 AM) Amitriptyline HCl (25MG  Tablet, Oral) Active. Lisinopril-hydroCHLOROthiazide (10-12.5MG  Tablet, Oral) Active. oxyCODONE-Acetaminophen (7.5-325MG  Tablet, Oral)  Active. Medications Reconciled  Social History [name], [place]; 04/08/2019 10:19 AM) Caffeine use Coffee.  No alcohol use No drug use Tobacco use Never smoker.  Family History [name], [place]; 04/08/2019 10:19 AM) Alcohol Abuse Father, Mother. Arthritis Father. Colon Cancer Sister. Diabetes Mellitus Father, Mother, Sister, Son. Seizure disorder Daughter.  Pregnancy / Birth History [name], [place]; 04/08/2019 10:19 AM) Age at menarche 54 years. Age of menopause 38-55 Gravida 3 Maternal age 32-20 Para 3 Regular periods  Other Problems [name], CMA; 04/08/2019 10:19 AM) Arthritis Back Pain Bladder Problems High blood pressure Oophorectomy Bilateral. Ovarian Cancer     Review of Systems [name] CMA; 04/08/2019 10:19 AM) General Not Present- Appetite Loss, Chills, Fatigue, Fever, Night Sweats, Weight Gain and Weight Loss. Skin Not Present- Change in Wart/Mole, Dryness, Hives, Jaundice, New Lesions, Non-Healing Wounds, Rash and Ulcer. HEENT Present- Hearing Loss, Seasonal Allergies and Wears glasses/contact lenses. Not Present- Earache, Hoarseness, Nose Bleed, Oral Ulcers, Ringing in the Ears, Sinus Pain, Sore Throat, Visual Disturbances and Yellow Eyes. Respiratory Not Present- Bloody sputum, Chronic Cough, Difficulty Breathing, Snoring and Wheezing. Breast Not Present- Breast Mass, Breast Pain, Nipple Discharge and Skin Changes. Cardiovascular Not Present- Chest Pain, Difficulty Breathing Lying Down, Leg Cramps, Palpitations, Rapid Heart Rate, Shortness of Breath and Swelling of Extremities. Gastrointestinal Present- Bloating. Not Present- Abdominal Pain, Bloody Stool, Change in Bowel Habits, Chronic diarrhea, Constipation, Difficulty Swallowing, Excessive gas, Gets full quickly at meals, Hemorrhoids, Indigestion, Nausea, Rectal Pain and Vomiting. Female Genitourinary Not Present- Frequency, Nocturia, Painful Urination,  Pelvic Pain and Urgency. Musculoskeletal Present- Back Pain, Joint Pain and Joint Stiffness. Not Present- Muscle Pain, Muscle Weakness and Swelling of Extremities. Neurological Not Present- Decreased Memory, Fainting, Headaches, Numbness, Seizures, Tingling, Tremor, Trouble walking and Weakness. Psychiatric Not Present- Anxiety, Bipolar, Change in Sleep Pattern, Depression, Fearful and Frequent crying. Endocrine Not Present- Cold Intolerance, Excessive Hunger, Hair Changes, Heat Intolerance, Hot flashes and New Diabetes. Hematology Not Present- Blood Thinners, Easy Bruising, Excessive bleeding, Gland problems, HIV and Persistent Infections.  Vitals [name] CMA; 04/08/2019 10:20 AM) 04/08/2019 10:20 AM Weight: 193.8 lb Height: 61in Body Surface Area: 1.86 m Body Mass Index: 36.62 kg/m  Temp.: 59F  Pulse: 95 (Regular)  BP: 142/88 (Sitting, Left Arm, Standard)        Physical Exam Adin Hector MD; 04/08/2019 10:23 AM)  General Mental Status-Alert. General Appearance-Not in acute distress, Not Sickly. Orientation-Oriented X3. Hydration-Well hydrated. Voice-Normal.  Integumentary Global Assessment Upon inspection and palpation of skin surfaces of the - Axillae: non-tender, no inflammation or ulceration, no drainage. and Distribution of scalp and body hair is normal. General Characteristics Temperature - normal warmth is noted.  Head and Neck Head-normocephalic, atraumatic with no lesions or palpable masses. Face Global Assessment - atraumatic, no absence of expression. Neck Global Assessment - no abnormal movements, no bruit auscultated on the right, no bruit auscultated on the left, no decreased range of motion, non-tender. Trachea-midline. Thyroid Gland Characteristics - non-tender.  Eye Eyeball - Left-Extraocular movements intact, No Nystagmus - Left. Eyeball - Right-Extraocular movements intact, No Nystagmus - Right. Cornea  - Left-No Hazy - Left. Cornea - Right-No Hazy - Right. Sclera/Conjunctiva - Left-No scleral icterus, No Discharge - Left. Sclera/Conjunctiva - Right-No scleral icterus, No Discharge - Right. Pupil - Left-Direct reaction to light normal. Pupil - Right-Direct reaction to light normal.  ENMT Ears Pinna - Left - no drainage observed, no generalized tenderness observed. Pinna - Right - no drainage observed, no generalized tenderness observed. Nose and Sinuses External Inspection of the Nose - no destructive lesion observed. Inspection of  the nares - Left - quiet respiration. Inspection of the nares - Right - quiet respiration. Mouth and Throat Lips - Upper Lip - no fissures observed, no pallor noted. Lower Lip - no fissures observed, no pallor noted. Nasopharynx - no discharge present. Oral Cavity/Oropharynx - Tongue - no dryness observed. Oral Mucosa - no cyanosis observed. Hypopharynx - no evidence of airway distress observed.  Chest and Lung Exam Inspection Movements - Normal and Symmetrical. Accessory muscles - No use of accessory muscles in breathing. Palpation Palpation of the chest reveals - Non-tender. Auscultation Breath sounds - Normal and Clear.  Cardiovascular Auscultation Rhythm - Regular. Murmurs & Other Heart Sounds - Auscultation of the heart reveals - No Murmurs and No Systolic Clicks.  Abdomen Inspection Inspection of the abdomen reveals - No Visible peristalsis and No Abnormal pulsations. Umbilicus - No Bleeding, No Urine drainage. Palpation/Percussion Palpation and Percussion of the abdomen reveal - Soft, Non Tender, No Rebound tenderness, No Rigidity (guarding) and No Cutaneous hyperesthesia. Note: Abdomen soft. Not severely distended. No distasis recti. No umbilical or other anterior abdominal wall hernias  Female Genitourinary Sexual Maturity Tanner 5 - Adult hair pattern. Note: No vaginal bleeding nor discharge  Peripheral Vascular Upper  Extremity Inspection - Left - No Cyanotic nailbeds - Left, Not Ischemic. Inspection - Right - No Cyanotic nailbeds - Right, Not Ischemic.  Neurologic Neurologic evaluation reveals -normal attention span and ability to concentrate, able to name objects and repeat phrases. Appropriate fund of knowledge , normal sensation and normal coordination. Mental Status Affect - not angry, not paranoid. Cranial Nerves-Normal Bilaterally. Gait-Normal.  Neuropsychiatric Mental status exam performed with findings of-able to articulate well with normal speech/language, rate, volume and coherence, thought content normal with ability to perform basic computations and apply abstract reasoning and no evidence of hallucinations, delusions, obsessions or homicidal/suicidal ideation.  Musculoskeletal Global Assessment Spine, Ribs and Pelvis - no instability, subluxation or laxity. Right Upper Extremity - no instability, subluxation or laxity.  Lymphatic Head & Neck  General Head & Neck Lymphatics: Bilateral - Description - No Localized lymphadenopathy. Axillary  General Axillary Region: Bilateral - Description - No Localized lymphadenopathy. Femoral & Inguinal  Generalized Femoral & Inguinal Lymphatics: Left - Description - No Localized lymphadenopathy. Right - Description - No Localized lymphadenopathy.    Assessment & Plan Adin Hector MD; 04/08/2019 10:40 AM)  PREOP COLON - ENCOUNTER FOR PREOPERATIVE EXAMINATION FOR GENERAL SURGICAL PROCEDURE (Z01.818)  Current Plans You are being scheduled for surgery- Our schedulers will call you.  You should hear from our office's scheduling department within 5 working days about the location, date, and time of surgery. We try to make accommodations for patient's preferences in scheduling surgery, but sometimes the OR schedule or the surgeon's schedule prevents [place] from making those accommodations.  If you have not heard from our office  [phone]) in 5 working days, call the office and ask for your surgeon's nurse.  If you have other questions about your diagnosis, plan, or surgery, call the office and ask for your surgeon's nurse.  Written instructions provided The anatomy & physiology of the digestive tract was discussed. The pathophysiology of the colon was discussed. Natural history risks without surgery was discussed. I feel the risks of no intervention will lead to serious problems that outweigh the operative risks; therefore, I recommended a partial colectomy to remove the pathology. Minimally invasive (Robotic/Laparoscopic) & open techniques were discussed.  Risks such as bleeding, infection, abscess, leak, reoperation, possible ostomy, hernia, heart  attack, death, and other risks were discussed. I noted a good likelihood this will help address the problem. Goals of post-operative recovery were discussed as well. Need for adequate nutrition, daily bowel regimen and healthy physical activity, to optimize recovery was noted as well. We will work to minimize complications. Educational materials were available as well. Questions were answered. The patient expresses understanding & wishes to proceed with surgery.  Pt Education - [facility] Colon Bowel Prep 2018 ERAS/Miralax/Antibiotics Started Neomycin Sulfate 500 MG Oral Tablet, 2 (two) Tablet SEE NOTE, #6, 04/08/2019, No Refill. Local Order: Pharmacist Notes: TAKE TWO TABLETS AT 2 PM, 3 PM, AND 10 PM THE DAY PRIOR TO SURGERY Started Flagyl 500 MG Oral Tablet, 2 (two) Tablet SEE NOTE, #6, 04/08/2019, No Refill. Local Order: Pharmacist Notes: Take at 2pm, 3pm, and 10pm the day prior to your colon operation Pt Education - Pamphlet Given - Laparoscopic Colorectal Surgery: discussed with patient and provided information. Pt Education - [facility] Colectomy post-op instructions: discussed with patient and provided information.  ADENOMATOUS POLYP OF ASCENDING COLON  (D12.2) Impression: Large polyp of proximal ascending colon not amenable to endoscopic resection. Surgical consultation recommended earlier this year. Large polyp of proximal ascending colon not amenable to endoscopic resection.  Surgical consultation recommended earlier this year.  I think she would benefit from segmental colonic resection.  Reasonable to try a minimally invasive approach.  Resection with intracorporeal anastomosis and Pfannenstiel excision.  With her giant sheet of mesh, expected additional hour of lysis of adhesions, perhaps longer.  We will see.  We can do intracorporeal anastomosis and extracted through Pfannenstiel incision, avoiding her upper abdominal mesh.    Current Plans Pt Education - Polyps in the Colon and Rectum (Colonic and Rectal Polyps): colonic polyps

## 2019-04-18 ENCOUNTER — Encounter (HOSPITAL_BASED_OUTPATIENT_CLINIC_OR_DEPARTMENT_OTHER): Payer: Self-pay | Admitting: Urology

## 2019-04-18 ENCOUNTER — Other Ambulatory Visit: Payer: Self-pay

## 2019-04-18 ENCOUNTER — Other Ambulatory Visit (HOSPITAL_COMMUNITY)
Admission: RE | Admit: 2019-04-18 | Discharge: 2019-04-18 | Disposition: A | Discharge status: Home / Self Care | Payer: Medicare HMO | Source: Ambulatory Visit | Attending: Urology | Admitting: Urology

## 2019-04-18 ENCOUNTER — Encounter (HOSPITAL_COMMUNITY)
Admission: RE | Admit: 2019-04-18 | Discharge: 2019-04-18 | Disposition: A | Discharge status: Home / Self Care | Payer: Medicare HMO | Source: Ambulatory Visit | Attending: Urology | Admitting: Urology

## 2019-04-18 DIAGNOSIS — Z79899 Other long term (current) drug therapy: Secondary | ICD-10-CM | POA: Insufficient documentation

## 2019-04-18 DIAGNOSIS — I10 Essential (primary) hypertension: Secondary | ICD-10-CM | POA: Diagnosis not present

## 2019-04-18 DIAGNOSIS — Z01812 Encounter for preprocedural laboratory examination: Secondary | ICD-10-CM | POA: Diagnosis not present

## 2019-04-18 DIAGNOSIS — N135 Crossing vessel and stricture of ureter without hydronephrosis: Secondary | ICD-10-CM | POA: Insufficient documentation

## 2019-04-18 DIAGNOSIS — Z20828 Contact with and (suspected) exposure to other viral communicable diseases: Secondary | ICD-10-CM | POA: Insufficient documentation

## 2019-04-18 LAB — BASIC METABOLIC PANEL
Anion gap: 11 (ref 5–15)
BUN: 18 mg/dL (ref 8–23)
CO2: 26 mmol/L (ref 22–32)
Calcium: 9.3 mg/dL (ref 8.9–10.3)
Chloride: 100 mmol/L (ref 98–111)
Creatinine, Ser: 0.88 mg/dL (ref 0.44–1.00)
GFR calc Af Amer: >60 mL/min (ref >60)
GFR calc non Af Amer: >60 mL/min (ref >60)
Glucose, Bld: 97 mg/dL (ref 70–99)
Potassium: 4.1 mmol/L (ref 3.5–5.1)
Sodium: 137 mmol/L (ref 135–145)

## 2019-04-18 LAB — HEMOGLOBIN A1C
Hgb A1c MFr Bld: 6.1 % — ABNORMAL HIGH (ref 4.8–5.6)
Mean Plasma Glucose: 128.37 mg/dL

## 2019-04-18 LAB — CBC
HCT: 43.9 % (ref 36.0–46.0)
Hemoglobin: 14.5 g/dL (ref 12.0–15.0)
MCH: 32.1 pg (ref 26.0–34.0)
MCHC: 33 g/dL (ref 30.0–36.0)
MCV: 97.1 fL (ref 80.0–100.0)
Platelets: 336 10*3/uL (ref 150–400)
RBC: 4.52 MIL/uL (ref 3.87–5.11)
RDW: 12.1 % (ref 11.5–15.5)
WBC: 8.6 10*3/uL (ref 4.0–10.5)
nRBC: 0 % (ref 0.0–0.2)

## 2019-04-18 NOTE — Progress Notes (Signed)
Spoke w/ via phone for pre-op interview--- PT Lab needs dos----  none             Lab results------ CBC, BMP done 04-18-2019 in chart/ epic.  Current ekg in chart/ epic COVID test ------ 04-18-2019 Arrive at ------- 1000 NPO after ------  MN Medications to take morning of surgery ----- May take percocet if needed am dos w/ sips of water Diabetic medication ----- n/a Patient Special Instructions ----- n/a Pre-Op special Istructions ----- n/a Patient verbalized understanding of instructions that were given at this phone interview. Patient denies shortness of breath, chest pain, fever, cough a this phone interview.

## 2019-04-19 LAB — NOVEL CORONAVIRUS, NAA (HOSP ORDER, SEND-OUT TO REF LAB; TAT 18-24 HRS): SARS-CoV-2, NAA: NOT DETECTED

## 2019-04-19 NOTE — H&P (Signed)
Left ureteral stricture/obstruction with history of recurrent left pyelonephritis   She follows up today for continued management of her left ureteral obstruction due to stricture. She has continued to tolerate her indwelling left ureteral stent quite well. She has now been off of prophylactic antibiotics and has not had any urinary tract infections since her last stent change in late July. She remains in stable overall health.     ALLERGIES: Ondansetron HCl TABS Sulfa Drugs    MEDICATIONS: Allegra Allergy  Amitriptyline Hcl 25 mg tablet Oral  Lisinopril 20 MG Oral Tablet Oral  Neurontin 300 MG Oral Capsule Oral  Nitrofurantoin Mono-Macro 100 mg capsule  Uristat     GU PSH: Catheterization For Collection Of Specimen, Single Patient, All Places Of Service - 2019 Cysto Uretero Balloon Dil Strict - 2010 Cystoscopy Insert Stent, Left - 12/03/2018, Left - 07/26/2018, Left - 03/29/2018, Left - 12/07/2017, 2010, 2010 Omental Flap; Intra-abdom - 2010 Release Ureter - 2010 Sling - 2010       PSH Notes: Total Hip Replacement, Ureterolysis, Abdominal Surgery Omental Flap, Intra-abdominal, Cystourethroscopy W/ Ureteroscopy W/ Tx Of Ureteral Strict, Cystoscopy With Insertion Of Ureteral Stent Left, Mid-Urethral Sling Operation, Cystoscopy With Insertion Of Ureteral Stent Left, Back Surgery   NON-GU PSH: Total Hip Replacement - 2016     GU PMH: Flank Pain - 2019 Acute Cystitis/UTI - 2018 Chronic cystitis (w/o hematuria), Chronic cystitis - 2016 Ureteral stricture, Ureteral obstruction - 2016, Ureteral stricture, - 2014 Dysuria, Dysuria - 2016 Urinary Tract Inf, Unspec site, Pyuria - 2016, Urinary tract infection, - 2016 Urinary Urgency, Urinary urgency - 2016 Pyelonephritis, Pyelonephritis, acute - 2014      PMH Notes:   1) Left hydronephrosis and ureteral obstruction secondary to ureteral stricture: She is s/p a left anterior lumbar decompression and arthrodesis in September 2009. This  was performed via an anterior retroperitoneal approach. She presented to the emergency department in October 2009 with left lower quadrant pain, dysuria, and fever. A CT scan demonstrated a left lower quadrant fluid collection with left hydronephrosis. She was admitted to the hospital and was seen in consultation by Dr. Jeffie Pollock. A left percutaneous nephrostomy tube was placed. CT guided aspiration of the fluid collection was also performed and the fluid was consistent with a probable lymphocele. An antegrade stent was placed which was then removed in November 2009. She did well until May 2010 when she again presented with flank pain and fever and a renal ultrasound demonstrated left hydronephrosis. She was taken to the OR by Dr. Jeffie Pollock and a retrograde pyelogram showed a long proximal narrowing from the level of the left UPJ to the level of the bifurcation of the aorta. A stent was again placed which was removed in June 2010. I initially evaluated her in July 2010 and she was asymptomatic and she was therefore observed. However, she again developed left flank pain in August 2010 and was taken to the OR for further evaluation. She was found to have a short ureteral stricture at the level of L4 likely a result of an ischemic injury from her prior surgery. This was treated at that time with balloon dilation and stent placement. She again developed pyelonephritis and elected to proceed with definitive surgical repair. She underwent exploration on 03/11/09 and was found to have retroperitoneal fibrosis as the cause of her ureteral obstruction and likely related to her prior surgery. Her ureter was very difficult to remove from the surrounding fibrosis but she was able to undergo definitive ureterolysis  and omental wrap. She was noted to have declining relative renal function of her left kidney in March 2015 (25%). After discussing her options, the complexity of her surgical history, and the fact her global renal function  remained stable, she elected to proceed with observation rather than intervention.   Mar 2019: Left pyelonephritis - E. coli  Jun 2019: Left pyelonephritis - E. coli resistant to fluorquinolones, TMP/SMX, and tetracycline (sensitive to cephalexin)  Aug 2019: Left ureteral stent placement (initial) - 6 x 03 Apr 2018: Left ureteral stent - 6 x 31 Jul 2018: Left ureteral stent - 6 x 30 Nov 2018: L ureteral stent - 6 x 24   2) Stress incontinence: She is s/p a urethral sling by Dr. Jeffie Pollock in 2005.   3) Recurrent UTIs: She has previously been on prophylaxis with nitrofurantoin but was able to discontinue this therapy in 2013. She developed recurrent pyelonephritis in 2017.       NON-GU PMH: Pyuria/other UA findings (Stable) - 06/19/2018, - 2018 Hypertension    FAMILY HISTORY: 1 Daughter - Daughter 2 sons - Son Death of family member - Sister, Father, Mother Diabetes - Sister, Son, Father, Mother End Stage Renal Disease - No Family History   SOCIAL HISTORY: Marital Status: Widowed Preferred Language: English; Ethnicity: Not Hispanic Or Latino; Race: White Current Smoking Status: Patient has never smoked.   Tobacco Use Assessment Completed: Used Tobacco in last 30 days? Does not drink anymore.  Drinks 3 caffeinated drinks per day.    REVIEW OF SYSTEMS:    GU Review Female:   Patient denies frequent urination, hard to postpone urination, burning /pain with urination, get up at night to urinate, leakage of urine, stream starts and stops, trouble starting your stream, have to strain to urinate, and currently pregnant.  Gastrointestinal (Upper):   Patient denies nausea and vomiting.  Gastrointestinal (Lower):   Patient denies diarrhea and constipation.  Constitutional:   Patient denies fever, night sweats, weight loss, and fatigue.  Skin:   Patient denies skin rash/ lesion and itching.  Eyes:   Patient denies blurred vision and double vision.  Ears/ Nose/ Throat:   Patient denies sore  throat and sinus problems.  Hematologic/Lymphatic:   Patient denies swollen glands and easy bruising.  Cardiovascular:   Patient denies leg swelling and chest pains.  Respiratory:   Patient denies cough and shortness of breath.  Endocrine:   Patient denies excessive thirst.  Musculoskeletal:   Patient denies back pain and joint pain.  Neurological:   Patient denies headaches and dizziness.  Psychologic:   Patient denies anxiety and depression.   Notes: She denies chest pain or shortness of breath.     MULTI-SYSTEM PHYSICAL EXAMINATION:    Constitutional: Well-nourished. No physical deformities. Normally developed. Good grooming.  Respiratory: No labored breathing, no use of accessory muscles. Clear bilaterally.  Cardiovascular: Normal temperature, normal extremity pulses, no swelling, no varicosities. Regular rate and rhythm.        ASSESSMENT:      ICD-10 Details  1 GU:   Ureteral stricture - N13.5    PLAN:         1. Left ureteral stricture/obstruction: She was is to continue with ureteral stent management. Her urine will be cultured today. She will be scheduled for cystoscopy and left ureteral stent change. We have reviewed the potential risks, complications, and expected recovery process associated with this procedure. She gives informed consent to proceed. She will be provided perioperative antibiotic  therapy pending her culture results.

## 2019-04-22 ENCOUNTER — Ambulatory Visit (HOSPITAL_BASED_OUTPATIENT_CLINIC_OR_DEPARTMENT_OTHER): Payer: Medicare HMO | Admitting: Anesthesiology

## 2019-04-22 ENCOUNTER — Encounter (HOSPITAL_BASED_OUTPATIENT_CLINIC_OR_DEPARTMENT_OTHER): Payer: Self-pay | Admitting: Urology

## 2019-04-22 ENCOUNTER — Encounter (HOSPITAL_BASED_OUTPATIENT_CLINIC_OR_DEPARTMENT_OTHER)
Admission: RE | Disposition: A | Discharge status: Home / Self Care | Payer: Self-pay | Source: Home / Self Care | Attending: Urology

## 2019-04-22 ENCOUNTER — Ambulatory Visit (HOSPITAL_COMMUNITY)
Admission: RE | Admit: 2019-04-22 | Discharge: 2019-04-22 | Disposition: A | Discharge status: Home / Self Care | Payer: Medicare HMO | Attending: Urology | Admitting: Urology

## 2019-04-22 ENCOUNTER — Other Ambulatory Visit: Payer: Self-pay

## 2019-04-22 DIAGNOSIS — Z96649 Presence of unspecified artificial hip joint: Secondary | ICD-10-CM | POA: Insufficient documentation

## 2019-04-22 DIAGNOSIS — Z882 Allergy status to sulfonamides status: Secondary | ICD-10-CM | POA: Diagnosis not present

## 2019-04-22 DIAGNOSIS — Z87440 Personal history of urinary (tract) infections: Secondary | ICD-10-CM | POA: Insufficient documentation

## 2019-04-22 DIAGNOSIS — N135 Crossing vessel and stricture of ureter without hydronephrosis: Secondary | ICD-10-CM | POA: Diagnosis not present

## 2019-04-22 DIAGNOSIS — I10 Essential (primary) hypertension: Secondary | ICD-10-CM | POA: Insufficient documentation

## 2019-04-22 DIAGNOSIS — Z888 Allergy status to other drugs, medicaments and biological substances status: Secondary | ICD-10-CM | POA: Insufficient documentation

## 2019-04-22 DIAGNOSIS — N131 Hydronephrosis with ureteral stricture, not elsewhere classified: Secondary | ICD-10-CM | POA: Diagnosis present

## 2019-04-22 DIAGNOSIS — Z79899 Other long term (current) drug therapy: Secondary | ICD-10-CM | POA: Diagnosis not present

## 2019-04-22 DIAGNOSIS — Z960 Presence of urogenital implants: Secondary | ICD-10-CM | POA: Diagnosis not present

## 2019-04-22 DIAGNOSIS — E785 Hyperlipidemia, unspecified: Secondary | ICD-10-CM | POA: Diagnosis not present

## 2019-04-22 DIAGNOSIS — R69 Illness, unspecified: Secondary | ICD-10-CM | POA: Diagnosis not present

## 2019-04-22 HISTORY — DX: Polyp of colon: K63.5

## 2019-04-22 HISTORY — PX: CYSTOSCOPY WITH STENT PLACEMENT: SHX5790

## 2019-04-22 SURGERY — CYSTOSCOPY, WITH STENT INSERTION
Anesthesia: General | Site: Ureter | Laterality: Left

## 2019-04-22 MED ORDER — CEFAZOLIN SODIUM-DEXTROSE 2-4 GM/100ML-% IV SOLN
2.0000 g | Freq: Once | INTRAVENOUS | Status: AC
  Administered 2019-04-22: 2 g via INTRAVENOUS
  Filled 2019-04-22: qty 100

## 2019-04-22 MED ORDER — BISACODYL 5 MG PO TBEC
20.0000 mg | DELAYED_RELEASE_TABLET | Freq: Once | ORAL | Status: DC
  Filled 2019-04-22: qty 4

## 2019-04-22 MED ORDER — LIDOCAINE 2% (20 MG/ML) 5 ML SYRINGE
INTRAMUSCULAR | Status: AC
  Filled 2019-04-22: qty 5

## 2019-04-22 MED ORDER — METRONIDAZOLE 500 MG PO TABS
1000.0000 mg | ORAL_TABLET | ORAL | Status: DC
  Filled 2019-04-22: qty 2

## 2019-04-22 MED ORDER — BUPIVACAINE LIPOSOME 1.3 % IJ SUSP
20.0000 mL | Freq: Once | INTRAMUSCULAR | Status: DC
  Filled 2019-04-22: qty 20

## 2019-04-22 MED ORDER — PROPOFOL 10 MG/ML IV BOLUS
INTRAVENOUS | Status: DC | PRN
  Administered 2019-04-22: 130 mg via INTRAVENOUS

## 2019-04-22 MED ORDER — SODIUM CHLORIDE 0.9 % IV SOLN
INTRAVENOUS | Status: DC
  Filled 2019-04-22: qty 6

## 2019-04-22 MED ORDER — DEXAMETHASONE SODIUM PHOSPHATE 10 MG/ML IJ SOLN
INTRAMUSCULAR | Status: DC | PRN
  Administered 2019-04-22: 8 mg via INTRAVENOUS

## 2019-04-22 MED ORDER — DIPHENHYDRAMINE HCL 50 MG/ML IJ SOLN
INTRAMUSCULAR | Status: DC | PRN
  Administered 2019-04-22: 12.5 mg via INTRAVENOUS

## 2019-04-22 MED ORDER — ENOXAPARIN SODIUM 40 MG/0.4ML ~~LOC~~ SOLN
40.0000 mg | Freq: Once | SUBCUTANEOUS | Status: AC
  Administered 2019-04-22: 40 mg via SUBCUTANEOUS
  Filled 2019-04-22 (×2): qty 0.4

## 2019-04-22 MED ORDER — PHENYLEPHRINE 40 MCG/ML (10ML) SYRINGE FOR IV PUSH (FOR BLOOD PRESSURE SUPPORT)
PREFILLED_SYRINGE | INTRAVENOUS | Status: AC
  Filled 2019-04-22: qty 10

## 2019-04-22 MED ORDER — GABAPENTIN 300 MG PO CAPS
300.0000 mg | ORAL_CAPSULE | ORAL | Status: AC
  Administered 2019-04-22: 300 mg via ORAL
  Filled 2019-04-22: qty 1

## 2019-04-22 MED ORDER — CEFAZOLIN SODIUM-DEXTROSE 2-4 GM/100ML-% IV SOLN
INTRAVENOUS | Status: AC
  Filled 2019-04-22: qty 100

## 2019-04-22 MED ORDER — ENOXAPARIN SODIUM 40 MG/0.4ML ~~LOC~~ SOLN
SUBCUTANEOUS | Status: AC
  Filled 2019-04-22: qty 0.4

## 2019-04-22 MED ORDER — SODIUM CHLORIDE 0.9 % IV SOLN
2.0000 g | INTRAVENOUS | Status: DC
  Filled 2019-04-22: qty 2

## 2019-04-22 MED ORDER — SODIUM CHLORIDE 0.9 % IR SOLN
Status: DC | PRN
  Administered 2019-04-22: 1 via INTRAVESICAL

## 2019-04-22 MED ORDER — GABAPENTIN 300 MG PO CAPS
ORAL_CAPSULE | ORAL | Status: AC
  Filled 2019-04-22: qty 1

## 2019-04-22 MED ORDER — FENTANYL CITRATE (PF) 100 MCG/2ML IJ SOLN
INTRAMUSCULAR | Status: AC
  Filled 2019-04-22: qty 2

## 2019-04-22 MED ORDER — CLINDAMYCIN PHOSPHATE 900 MG/50ML IV SOLN
INTRAVENOUS | Status: AC
  Filled 2019-04-22: qty 50

## 2019-04-22 MED ORDER — PHENYLEPHRINE 40 MCG/ML (10ML) SYRINGE FOR IV PUSH (FOR BLOOD PRESSURE SUPPORT)
PREFILLED_SYRINGE | INTRAVENOUS | Status: DC | PRN
  Administered 2019-04-22: 80 ug via INTRAVENOUS

## 2019-04-22 MED ORDER — PROPOFOL 10 MG/ML IV BOLUS
INTRAVENOUS | Status: AC
  Filled 2019-04-22: qty 20

## 2019-04-22 MED ORDER — ACETAMINOPHEN 500 MG PO TABS
ORAL_TABLET | ORAL | Status: AC
  Filled 2019-04-22: qty 2

## 2019-04-22 MED ORDER — DEXAMETHASONE SODIUM PHOSPHATE 10 MG/ML IJ SOLN
INTRAMUSCULAR | Status: AC
  Filled 2019-04-22: qty 1

## 2019-04-22 MED ORDER — NEOMYCIN SULFATE 500 MG PO TABS
1000.0000 mg | ORAL_TABLET | ORAL | Status: DC
  Filled 2019-04-22: qty 2

## 2019-04-22 MED ORDER — FENTANYL CITRATE (PF) 100 MCG/2ML IJ SOLN
INTRAMUSCULAR | Status: DC | PRN
  Administered 2019-04-22: 50 ug via INTRAVENOUS

## 2019-04-22 MED ORDER — LIDOCAINE 2% (20 MG/ML) 5 ML SYRINGE
INTRAMUSCULAR | Status: DC | PRN
  Administered 2019-04-22: 40 mg via INTRAVENOUS

## 2019-04-22 MED ORDER — DIPHENHYDRAMINE HCL 50 MG/ML IJ SOLN
INTRAMUSCULAR | Status: AC
  Filled 2019-04-22: qty 1

## 2019-04-22 MED ORDER — ACETAMINOPHEN 500 MG PO TABS
1000.0000 mg | ORAL_TABLET | ORAL | Status: AC
  Administered 2019-04-22: 1000 mg via ORAL
  Filled 2019-04-22: qty 2

## 2019-04-22 MED ORDER — ALVIMOPAN 12 MG PO CAPS
12.0000 mg | ORAL_CAPSULE | ORAL | Status: DC
  Filled 2019-04-22: qty 1

## 2019-04-22 MED ORDER — LACTATED RINGERS IV SOLN
INTRAVENOUS | Status: DC
  Administered 2019-04-22: 10:00:00 via INTRAVENOUS
  Filled 2019-04-22: qty 1000

## 2019-04-22 MED ORDER — POLYETHYLENE GLYCOL 3350 17 GM/SCOOP PO POWD
1.0000 | Freq: Once | ORAL | Status: DC
  Filled 2019-04-22: qty 255

## 2019-04-22 SURGICAL SUPPLY — 16 items
BAG DRAIN URO-CYSTO SKYTR STRL (DRAIN) ×2 IMPLANT
BAG DRN UROCATH (DRAIN) ×1
CATH INTERMIT  6FR 70CM (CATHETERS) IMPLANT
CLOTH BEACON ORANGE TIMEOUT ST (SAFETY) ×2 IMPLANT
GLOVE BIO SURGEON STRL SZ7.5 (GLOVE) ×2 IMPLANT
GOWN STRL REUS W/ TWL XL LVL3 (GOWN DISPOSABLE) ×1 IMPLANT
GOWN STRL REUS W/TWL XL LVL3 (GOWN DISPOSABLE) ×2
GUIDEWIRE ANG ZIPWIRE 038X150 (WIRE) IMPLANT
GUIDEWIRE STR DUAL SENSOR (WIRE) ×1 IMPLANT
KIT TURNOVER CYSTO (KITS) ×2 IMPLANT
MANIFOLD NEPTUNE II (INSTRUMENTS) ×1 IMPLANT
NS IRRIG 500ML POUR BTL (IV SOLUTION) ×2 IMPLANT
PACK CYSTO (CUSTOM PROCEDURE TRAY) ×2 IMPLANT
STENT URET 6FRX24 CONTOUR (STENTS) ×1 IMPLANT
TUBE CONNECTING 12X1/4 (SUCTIONS) ×1 IMPLANT
TUBING UROLOGY SET (TUBING) IMPLANT

## 2019-04-22 NOTE — Anesthesia Preprocedure Evaluation (Signed)
Anesthesia Evaluation  Patient identified by MRN, date of birth, ID band Patient awake    Reviewed: Allergy & Precautions, NPO status , Patient's Chart, lab work & pertinent test results  History of Anesthesia Complications Negative for: history of anesthetic complications  Airway Mallampati: I  TM Distance: >3 FB Neck ROM: Full    Dental  (+) Edentulous Upper, Edentulous Lower   Pulmonary neg pulmonary ROS,    Pulmonary exam normal        Cardiovascular hypertension, Pt. on medications Normal cardiovascular exam     Neuro/Psych PSYCHIATRIC DISORDERS Depression negative neurological ROS     GI/Hepatic negative GI ROS, Neg liver ROS,   Endo/Other  negative endocrine ROS  Renal/GU Renal disease (left ureteral stricture/obstruction)  negative genitourinary   Musculoskeletal negative musculoskeletal ROS (+)   Abdominal   Peds  Hematology negative hematology ROS (+)   Anesthesia Other Findings   Reproductive/Obstetrics                             Anesthesia Physical Anesthesia Plan  ASA: III  Anesthesia Plan: General   Post-op Pain Management:    Induction: Intravenous  PONV Risk Score and Plan: 3 and Dexamethasone, Treatment may vary due to age or medical condition and Midazolam  Airway Management Planned: LMA  Additional Equipment: None  Intra-op Plan:   Post-operative Plan: Extubation in OR  Informed Consent: I have reviewed the patients History and Physical, chart, labs and discussed the procedure including the risks, benefits and alternatives for the proposed anesthesia with the patient or authorized representative who has indicated his/her understanding and acceptance.     Dental advisory given  Plan Discussed with:   Anesthesia Plan Comments: (Chart states N/V with Zofran but pt reports hives.)        Anesthesia Quick Evaluation

## 2019-04-22 NOTE — Transfer of Care (Signed)
Last Vitals:  Vitals Value Taken Time  BP 137/60 04/22/19 1300  Temp 36.7 C 04/22/19 1258  Pulse 67 04/22/19 1300  Resp 14 04/22/19 1300  SpO2 99 % 04/22/19 1300  Vitals shown include unvalidated device data.  Last Pain:  Vitals:   04/22/19 1010  TempSrc:   PainSc: 4       Patients Stated Pain Goal: 4 (04/22/19 1010)  Immediate Anesthesia Transfer of Care Note  Patient: [name]  Procedure(s) Performed: Procedure(s) (LRB): CYSTOSCOPY WITH STENT CHANGE (Left)  Patient Location: PACU  Anesthesia Type: General  Level of Consciousness: awake, alert  and oriented  Airway & Oxygen Therapy: Patient Spontanous Breathing and Patient connected to nasal cannula  oxygen  Post-op Assessment: Report given to PACU RN and Post -op Vital signs reviewed and stable  Post vital signs: Reviewed and stable  Complications: No apparent anesthesia complications

## 2019-04-22 NOTE — Anesthesia Procedure Notes (Signed)
Procedure Name: LMA Insertion Date/Time: 04/22/2019 12:39 PM Performed by: Mechele Claude, CRNA Pre-anesthesia Checklist: Patient identified, Emergency Drugs available, Suction available and Patient being monitored Patient Re-evaluated:Patient Re-evaluated prior to induction Oxygen Delivery Method: Circle system utilized Preoxygenation: Pre-oxygenation with 100% oxygen Induction Type: IV induction Ventilation: Mask ventilation without difficulty LMA: LMA inserted LMA Size: 4.0 Number of attempts: 1 Airway Equipment and Method: Bite block Placement Confirmation: positive ETCO2 Tube secured with: Tape Dental Injury: Teeth and Oropharynx as per pre-operative assessment

## 2019-04-22 NOTE — Op Note (Signed)
Preoperative diagnosis:  1. Left ureteral obstruction   Postoperative diagnosis:  1. Left ureteral obstruction   Procedure:  1. Cystoscopy 2. Left ureteral stent placement (6 x 24 - no string)  Surgeon: Roxy Horseman, Brooke Bonito. M.D.  Anesthesia: General  Complications: None  Intraoperative findings: Her stent was minimally encrusted.  EBL: Minimal  Specimens: None  Indication: [name] is a 73 y.o. patient with left ureteral obstruction. After reviewing the management options for treatment, he elected to proceed with the above surgical procedure(s). We have discussed the potential benefits and risks of the procedure, side effects of the proposed treatment, the likelihood of the patient achieving the goals of the procedure, and any potential problems that might occur during the procedure or recuperation. Informed consent has been obtained.  Description of procedure:  The patient was taken to the operating room and general anesthesia was induced.  The patient was placed in the dorsal lithotomy position, prepped and draped in the usual sterile fashion, and preoperative antibiotics were administered. A preoperative time-out was performed.   Cystourethroscopy was performed.  The patient's urethra was examined and was unremarkable. The bladder was then systematically examined in its entirety. There was no evidence for any bladder tumors, stones, or other mucosal pathology.    Attention then turned to the left ureteral orifice and the patient's indwelling ureteral stent was identified and brought out to the urethral meatus with the flexible graspers.  A 0.38 sensor guidewire was then advanced up the left ureter into the renal pelvis under fluoroscopic guidance.  The wire was then backloaded through the cystoscope and a ureteral stent was advance over the wire using Seldinger technique.  The stent was positioned appropriately under fluoroscopic and cystoscopic guidance.  The wire was  then removed with an adequate stent curl noted in the renal pelvis as well as in the bladder.  The bladder was then emptied and the procedure ended.  The patient appeared to tolerate the procedure well and without complications.  The patient was able to be awakened and transferred to the recovery unit in satisfactory condition.    Pryor Curia MD

## 2019-04-22 NOTE — Anesthesia Postprocedure Evaluation (Signed)
Anesthesia Post Note  Patient: [name]  Procedure(s) Performed: CYSTOSCOPY WITH STENT CHANGE (Left Ureter)     Patient location during evaluation: PACU Anesthesia Type: General Level of consciousness: awake and alert Pain management: pain level controlled Vital Signs Assessment: post-procedure vital signs reviewed and stable Respiratory status: spontaneous breathing, nonlabored ventilation and respiratory function stable Cardiovascular status: blood pressure returned to baseline and stable Postop Assessment: no apparent nausea or vomiting Anesthetic complications: no    Last Vitals:  Vitals:   04/22/19 1330 04/22/19 1355  BP: 115/67 (!) 148/65  Pulse: (!) 58 69  Resp: 12 18  Temp:    SpO2: 98% 99%    Last Pain:  Vitals:   04/22/19 1355  TempSrc:   PainSc: 0-No pain                 Lidia Collum

## 2019-04-22 NOTE — Discharge Instructions (Addendum)
1. You may see some blood in the urine and may have some burning with urination for 48-72 hours. You also may notice that you have to urinate more frequently or urgently after your procedure which is normal.  2. You should call should you develop an inability urinate, fever > 101, persistent nausea and vomiting that prevents you from eating or drinking to stay hydrated.  3. If you have a stent, you will likely urinate more frequently and urgently until the stent is removed and you may experience some discomfort/pain in the lower abdomen and flank especially when urinating. You may take pain medication prescribed to you if needed for pain. You may also intermittently have blood in the urine until the stent is removed.   CYSTOSCOPY HOME CARE INSTRUCTIONS  Activity: Rest for the remainder of the day.  Do not drive or operate equipment today.  You may resume normal activities in one to two days as instructed by your physician.   Meals: Drink plenty of liquids and eat light foods such as gelatin or soup this evening.  You may return to a normal meal plan tomorrow.  Return to Work: You may return to work in one to two days or as instructed by your physician.  Special Instructions / Symptoms: Call your physician if any of these symptoms occur:   -persistent or heavy bleeding  -bleeding which continues after first few urination  -large blood clots that are difficult to pass  -urine stream diminishes or stops completely  -fever equal to or higher than 101 degrees Farenheit.  -cloudy urine with a strong, foul odor  -severe pain  Females should always wipe from front to back after elimination.  You may feel some burning pain when you urinate.  This should disappear with time.  Applying moist heat to the lower abdomen or a hot tub bath may help relieve the pain. \    Post Anesthesia Home Care Instructions  Activity: Get plenty of rest for the remainder of the day. A responsible adult should  stay with you for 24 hours following the procedure.  For the next 24 hours, DO NOT: -Drive a car -Paediatric nurse -Drink alcoholic beverages -Take any medication unless instructed by your physician -Make any legal decisions or sign important papers.  Meals: Start with liquid foods such as gelatin or soup. Progress to regular foods as tolerated. Avoid greasy, spicy, heavy foods. If nausea and/or vomiting occur, drink only clear liquids until the nausea and/or vomiting subsides. Call your physician if vomiting continues.  Special Instructions/Symptoms: Your throat may feel dry or sore from the anesthesia or the breathing tube placed in your throat during surgery. If this causes discomfort, gargle with warm salt water. The discomfort should disappear within 24 hours.  If you had a scopolamine patch placed behind your ear for the management of post- operative nausea and/or vomiting:  1. The medication in the patch is effective for 72 hours, after which it should be removed.  Wrap patch in a tissue and discard in the trash. Wash hands thoroughly with soap and water. 2. You may remove the patch earlier than 72 hours if you experience unpleasant side effects which may include dry mouth, dizziness or visual disturbances. 3. Avoid touching the patch. Wash your hands with soap and water after contact with the patch.

## 2019-04-29 ENCOUNTER — Other Ambulatory Visit: Payer: Self-pay | Admitting: *Deleted

## 2019-04-29 DIAGNOSIS — I10 Essential (primary) hypertension: Secondary | ICD-10-CM

## 2019-04-29 MED ORDER — LISINOPRIL-HYDROCHLOROTHIAZIDE 10-12.5 MG PO TABS: 1.0000 | ORAL_TABLET | Freq: Every day | ORAL | 0 refills | Status: DC

## 2019-05-15 DIAGNOSIS — R69 Illness, unspecified: Secondary | ICD-10-CM | POA: Diagnosis not present

## 2019-05-20 ENCOUNTER — Other Ambulatory Visit: Payer: Self-pay | Admitting: *Deleted

## 2019-05-20 DIAGNOSIS — G8929 Other chronic pain: Secondary | ICD-10-CM

## 2019-05-20 MED ORDER — AMITRIPTYLINE HCL 25 MG PO TABS: 25.0000 mg | ORAL_TABLET | Freq: Every day | ORAL | 0 refills | Status: DC

## 2019-05-23 ENCOUNTER — Ambulatory Visit (INDEPENDENT_AMBULATORY_CARE_PROVIDER_SITE_OTHER): Payer: Medicare HMO | Admitting: Family Medicine

## 2019-05-23 ENCOUNTER — Encounter: Payer: Self-pay | Admitting: Family Medicine

## 2019-05-23 ENCOUNTER — Other Ambulatory Visit: Payer: Self-pay

## 2019-05-23 DIAGNOSIS — R300 Dysuria: Secondary | ICD-10-CM | POA: Diagnosis not present

## 2019-05-23 DIAGNOSIS — R350 Frequency of micturition: Secondary | ICD-10-CM | POA: Diagnosis not present

## 2019-05-23 DIAGNOSIS — Z96643 Presence of artificial hip joint, bilateral: Secondary | ICD-10-CM | POA: Diagnosis not present

## 2019-05-23 DIAGNOSIS — R3915 Urgency of urination: Secondary | ICD-10-CM | POA: Diagnosis not present

## 2019-05-23 DIAGNOSIS — M7061 Trochanteric bursitis, right hip: Secondary | ICD-10-CM | POA: Diagnosis not present

## 2019-05-23 DIAGNOSIS — M25552 Pain in left hip: Secondary | ICD-10-CM | POA: Diagnosis not present

## 2019-05-23 DIAGNOSIS — M25551 Pain in right hip: Secondary | ICD-10-CM | POA: Diagnosis not present

## 2019-05-23 DIAGNOSIS — R3989 Other symptoms and signs involving the genitourinary system: Secondary | ICD-10-CM

## 2019-05-23 DIAGNOSIS — M7062 Trochanteric bursitis, left hip: Secondary | ICD-10-CM | POA: Diagnosis not present

## 2019-05-23 MED ORDER — AMOXICILLIN-POT CLAVULANATE 875-125 MG PO TABS: 1.0000 | ORAL_TABLET | Freq: Two times a day (BID) | ORAL | 0 refills | Status: AC

## 2019-05-23 NOTE — Progress Notes (Signed)
Virtual Visit via telephone Note Due to COVID-19 pandemic this visit was conducted virtually. This visit type was conducted due to national recommendations for restrictions regarding the COVID-19 Pandemic (e.g. social distancing, sheltering in place) in an effort to limit this patient's exposure and mitigate transmission in our community. All issues noted in this document were discussed and addressed.  A physical exam was not performed with this format.   I connected with [name] on 05/23/2019 at 1640 by telephone and verified that I am speaking with the correct person using two identifiers. [name] is currently located at home and family is currently with them during visit. The provider, Monia Pouch, FNP is located in their office at time of visit.  I discussed the limitations, risks, security and privacy concerns of performing an evaluation and management service by telephone and the availability of in person appointments. I also discussed with the patient that there may be a patient responsible charge related to this service. The patient expressed understanding and agreed to proceed.  Subjective:  Patient ID: [name], female    DOB: May 04, 1946, 74 y.o.   MRN: [id number]  Chief Complaint:  Urinary Tract Infection   HPI: [name] is a 74 y.o. female presenting on 05/23/2019 for Urinary Tract Infection   Dysuria  This is a new problem. The current episode started in the past 7 days. The problem occurs every urination. The problem has been unchanged. The quality of the pain is described as aching and burning. The pain is at a severity of 3/10. The pain is mild. There has been no fever. She is not sexually active. There is no history of pyelonephritis. Associated symptoms include frequency and urgency. Pertinent negatives include no chills, discharge, flank pain, hematuria, hesitancy, nausea, possible pregnancy, sweats or vomiting. She has tried increased  fluids and acetaminophen for the symptoms. The treatment provided no relief. Her past medical history is significant for recurrent UTIs and a urological procedure.  Pt had a cystoscopy with left ureter stent placement on 04/22/2019. Pt attempted to reach urologist with no response. States she is worried the symptoms will worsen and lead to her having to go to the ED so she called her PCP office. No weakness, confusion, or fatigue.    Relevant past medical, surgical, family, and social history reviewed and updated as indicated.  Allergies and medications reviewed and updated.   Past Medical History:  Diagnosis Date  . Arthritis   . Chronic back pain   . Chronic constipation   . Chronic constipation   . Chronic midline low back pain with sciatica   . Colon polyp    large ascending colon polyp , scheduled for resection 02/ 2021  . Full dentures   . History of endometrial cancer 2012   s/p   TAH w/ BSO 11-02-2010  . History of recurrent UTIs   . History of sepsis    multiple times (some due to e.coli) last sepsis 07/ 2019  . History of small bowel obstruction    x2  ,  hx bowel resection  . HOH (hard of hearing)   . Hyperlipidemia   . Hypertension   . Retroperitoneal fibrosis   . Ureteral obstruction, left urologist-- dr Alinda Money   secondary to adhesions/ retroperitoneal fibrosis, treated with ureteral stent  . Vertigo   . Wears glasses     Past Surgical History:  Procedure Laterality Date  . ABDOMINAL HYSTERECTOMY  11/02/10  dr Alycia Rossetti  @  [facility]   TAH, BSO, incisional hernia repair , lysis of adhesions for endometrial cancer  . ANTERIOR LUMBAR FUSION  01-28-2008   dr elsner  @[facility]    L2 -- L4  . BUNIONECTOMY Right 2003  . CATARACT EXTRACTION W/ INTRAOCULAR LENS  IMPLANT, BILATERAL  2007  . COLONOSCOPY  last one 12-24-2018  . CYSTO/  LEFT RETROGRADE PYELOGRAM/ URETEROSCOPY STENT PLACEMENT/  OPEN URETEROLYSIS WITH OMENTAL FLAP Left 03-11-2009    dr borden  @[facility]   . CYSTO/  [facility] SLING/   ANTERIOR REPAIR  12-06-2001    dr Jeffie Pollock @[facility]   . CYSTOSCOPY W/ URETERAL STENT PLACEMENT Left 12/07/2017   Procedure: CYSTOSCOPY WITH RETROGRADE PYELOGRAM/URETERAL STENT PLACEMENT;  Surgeon: Raynelle Bring, MD;  Location: [facility];  Service: Urology;  Laterality: Left;  . CYSTOSCOPY W/ URETERAL STENT PLACEMENT Left 03/29/2018   Procedure: CYSTOSCOPY WITH STENT  EXCHANGE;  Surgeon: Raynelle Bring, MD;  Location: [facility];  Service: Urology;  Laterality: Left;  . CYSTOSCOPY WITH RETROGRADE PYELOGRAM, URETEROSCOPY AND STENT PLACEMENT Left 12-29-2008   dr Alinda Money  @[facility]    WITH BALLOON DILATION LEFT URETERAL STRICTURE  . CYSTOSCOPY WITH RETROGRADE PYELOGRAM, URETEROSCOPY AND STENT PLACEMENT Left 09-12-2008    dr Jeffie Pollock @[facility]   . CYSTOSCOPY WITH STENT PLACEMENT Left 07/26/2018   Procedure: CYSTOSCOPY WITH STENT CHANGE;  Surgeon: Raynelle Bring, MD;  Location: [facility];  Service: Urology;  Laterality: Left;  . CYSTOSCOPY WITH STENT PLACEMENT Left 12/03/2018   Procedure: CYSTOSCOPY WITH STENT CHANGE;  Surgeon: Raynelle Bring, MD;  Location: [facility];  Service: Urology;  Laterality: Left;  . CYSTOSCOPY WITH STENT PLACEMENT Left 04/22/2019   Procedure: CYSTOSCOPY WITH STENT CHANGE;  Surgeon: Raynelle Bring, MD;  Location[facility];  Service: Urology;  Laterality: Left;  . FOOT SURGERY Left 06-23-2010   dr Beola Cord   left first and second toes  . INSERTION OF MESH N/A 04/23/2013   Procedure: INSERTION OF MESH;  Surgeon: Adin Hector, MD;  Location: [facility];  Service: General;  Laterality: N/A;  . KNEE ARTHROSCOPY Right 04-27-2007  dr Shellia Carwin @[facility]   . LAPAROSCOPIC LYSIS OF ADHESIONS N/A 04/23/2013   Procedure: LAPAROSCOPIC LYSIS OF ADHESIONS;  Surgeon: Adin Hector, MD;  Location: [facility];  Service: General;  Laterality: N/A;  . [place] and 1999  . POSTERIOR LUMBAR FUSION  12/17/2012   L3 -- L5 laminectomy and L3 -- 5 fusion  . TOTAL HIP ARTHROPLASTY Left 07/04/2014   Procedure:  LEFT TOTAL HIP ARTHROPLASTY ANTERIOR APPROACH;  Surgeon: Gearlean Alf, MD;  Location: [facility];  Service: Orthopedics;  Laterality: Left;  . TOTAL HIP ARTHROPLASTY Right 2009  . TOTAL KNEE ARTHROPLASTY Right 09-15-2009   dr Shellia Carwin @[facility]   . VENTRAL HERNIA REPAIR N/A 07/19/2012   Procedure: LAPAROSCOPIC LYSIS OF ADHESIONS, SMALL BOWEL RESECTION, SEROSAL REPAIR, PRIMARY VENTRAL HERNIA REPAIR;  Surgeon: Adin Hector, MD;  Location: [facility];  Service: General;  Laterality: N/A;  . VENTRAL HERNIA REPAIR N/A 04/23/2013   Procedure: LAPAROSCOPIC exploration and repair of hernia in abdominal VENTRAL wall  HERNIA;  Surgeon: Adin Hector, MD;  Location: [facility];  Service: General;  Laterality: N/A;    Social History   Socioeconomic History  . Marital status: Widowed    Spouse name: Not on file  . Number of children: 3  . Years of education: 64  . Highest education level: High school graduate  Occupational History  . Occupation: Retired  Tobacco Use  . Smoking status:  Never Smoker  . Smokeless tobacco: Never Used  Substance and Sexual Activity  . Alcohol use: No  . Drug use: No  . Sexual activity: Not Currently    Birth control/protection: Surgical  Other Topics Concern  . Not on file  Social History Narrative  . Not on file   Social Determinants of Health   Financial Resource Strain:   . Difficulty of Paying Living Expenses: Not on file  Food Insecurity:   . Worried About Charity fundraiser in the Last Year: Not on file  . Ran Out of Food in the Last Year: Not on file  Transportation Needs:   . Lack of Transportation (Medical): Not on file  . Lack of Transportation (Non-Medical): Not on file  Physical Activity:   . Days of Exercise per Week: Not on file  . Minutes of Exercise per Session: Not on file  Stress:   . Feeling of Stress : Not on file  Social Connections:   . Frequency of Communication with Friends and Family: Not on file  . Frequency of Social Gatherings with  Friends and Family: Not on file  . Attends Religious Services: Not on file  . Active Member of Clubs or Organizations: Not on file  . Attends Archivist Meetings: Not on file  . Marital Status: Not on file  Intimate Partner Violence:   . Fear of Current or Ex-Partner: Not on file  . Emotionally Abused: Not on file  . Physically Abused: Not on file  . Sexually Abused: Not on file    Outpatient Encounter Medications as of 05/23/2019  Medication Sig  . amitriptyline (ELAVIL) 25 MG tablet Take 1 tablet (25 mg total) by mouth at bedtime.  Marland Kitchen amoxicillin-clavulanate (AUGMENTIN) 875-125 MG tablet Take 1 tablet by mouth 2 (two) times daily for 10 days.  Marland Kitchen lisinopril-hydrochlorothiazide (ZESTORETIC) 10-12.5 MG tablet Take 1 tablet by mouth daily.  . methocarbamol (ROBAXIN) 500 MG tablet Take 500 mg by mouth at bedtime.   Marland Kitchen oxyCODONE-acetaminophen (PERCOCET) 7.5-325 MG tablet Take 1 tablet by mouth every 8 (eight) hours as needed for severe pain. (Patient taking differently: Take 1 tablet by mouth every 8 (eight) hours as needed for severe pain. )  . oxyCODONE-acetaminophen (PERCOCET) 7.5-325 MG tablet Take 1 tablet by mouth every 8 (eight) hours as needed for severe pain.  Marland Kitchen oxyCODONE-acetaminophen (PERCOCET) 7.5-325 MG tablet Take 1 tablet by mouth every 8 (eight) hours as needed for severe pain.  . Sennosides (EX-LAX PO) Take 1 tablet by mouth 2 (two) times a week.    No facility-administered encounter medications on file as of 05/23/2019.    Allergies  Allergen Reactions  . Cashew Nut Oil Hives  . Dog Epithelium Hives and Itching  . Lyrica [Pregabalin]     Weight gain  . Ondansetron Hcl Nausea And Vomiting    "made me throw up"  . Sulfa Antibiotics     "blistering lips and mouth sores"    Review of Systems  Constitutional: Negative for activity change, appetite change, chills, diaphoresis, fatigue, fever and unexpected weight change.  HENT: Negative.   Eyes: Negative.     Respiratory: Negative for cough, chest tightness and shortness of breath.   Cardiovascular: Negative for chest pain, palpitations and leg swelling.  Gastrointestinal: Negative for abdominal pain, blood in stool, constipation, diarrhea, nausea and vomiting.  Endocrine: Negative.   Genitourinary: Positive for dysuria, frequency and urgency. Negative for decreased urine volume, difficulty urinating, flank pain, hematuria and hesitancy.  Musculoskeletal: Negative for arthralgias, back pain and myalgias.  Skin: Negative.   Allergic/Immunologic: Negative.   Neurological: Negative for dizziness, weakness and headaches.  Hematological: Negative.   Psychiatric/Behavioral: Negative for agitation, confusion, hallucinations, sleep disturbance and suicidal ideas.  All other systems reviewed and are negative.        Observations/Objective: No vital signs or physical exam, this was a telephone or virtual health encounter.  Pt alert and oriented, answers all questions appropriately, and able to speak in full sentences.    Assessment and Plan: Nkenge was seen today for urinary tract infection.  Diagnoses and all orders for this visit:  Dysuria Frequency of micturition Urgency of micturition -     urinalysis- dip and micro; Future -     Urine culture; Future  Suspected UTI Reported symptoms consistent with UTI. Due to recent urologic procedure, will initiate below empirically based on previous urine cultures. Pt will come to office to provide urine sample for analysis and culture. Will change therapy if warranted. Pt encouraged to follow up with surgeon. Pt aware of symptoms that require emergent evaluation and treatment.  -     urinalysis- dip and micro; Future -     Urine culture; Future -     amoxicillin-clavulanate (AUGMENTIN) 875-125 MG tablet; Take 1 tablet by mouth 2 (two) times daily for 10 days.     Follow Up Instructions: Return if symptoms worsen or fail to improve.    I  discussed the assessment and treatment plan with the patient. The patient was provided an opportunity to ask questions and all were answered. The patient agreed with the plan and demonstrated an understanding of the instructions.   The patient was advised to call back or seek an in-person evaluation if the symptoms worsen or if the condition fails to improve as anticipated.  The above assessment and management plan was discussed with the patient. The patient verbalized understanding of and has agreed to the management plan. Patient is aware to call the clinic if they develop any new symptoms or if symptoms persist or worsen. Patient is aware when to return to the clinic for a follow-up visit. Patient educated on when it is appropriate to go to the emergency department.    I provided 15 minutes of non-face-to-face time during this encounter. The call started at 1640. The call ended at 1655. The other time was used for coordination of care.    Monia Pouch, FNP-C [facility] [address] [place], [place] [address] [phone] 05/23/2019

## 2019-05-24 ENCOUNTER — Other Ambulatory Visit: Payer: Self-pay

## 2019-05-24 ENCOUNTER — Other Ambulatory Visit: Payer: Medicare HMO

## 2019-05-24 DIAGNOSIS — R300 Dysuria: Secondary | ICD-10-CM | POA: Diagnosis not present

## 2019-05-24 DIAGNOSIS — R350 Frequency of micturition: Secondary | ICD-10-CM | POA: Diagnosis not present

## 2019-05-24 DIAGNOSIS — R3915 Urgency of urination: Secondary | ICD-10-CM | POA: Diagnosis not present

## 2019-05-24 DIAGNOSIS — R3989 Other symptoms and signs involving the genitourinary system: Secondary | ICD-10-CM | POA: Diagnosis not present

## 2019-05-24 LAB — URINALYSIS, COMPLETE
Bilirubin, UA: NEGATIVE
Glucose, UA: NEGATIVE
Ketones, UA: NEGATIVE
Nitrite, UA: POSITIVE — AB
Specific Gravity, UA: >1.03 — ABNORMAL HIGH (ref 1.005–1.030)
Urobilinogen, Ur: 0.2 mg/dL (ref 0.2–1.0)
pH, UA: 5.5 (ref 5.0–7.5)

## 2019-05-24 LAB — MICROSCOPIC EXAMINATION
Renal Epithel, UA: NONE SEEN /hpf
WBC, UA: >30 /hpf — AB (ref 0–5)

## 2019-05-24 NOTE — Addendum Note (Signed)
Addended by: Pollyann Kennedy F on: 05/24/2019 10:27 AM   Modules accepted: Orders

## 2019-05-24 NOTE — Progress Notes (Signed)
Please let pt know she does have a UTI. Due to her recent procedure, I would like for her to touch base with her urologist. Continue medications, will change if culture warrants. Please have her call urologist for follow up.

## 2019-05-25 LAB — URINE CULTURE: Organism ID, Bacteria: NO GROWTH

## 2019-06-08 ENCOUNTER — Other Ambulatory Visit (HOSPITAL_COMMUNITY): Payer: Self-pay

## 2019-06-08 ENCOUNTER — Other Ambulatory Visit (HOSPITAL_COMMUNITY): Payer: Medicare HMO

## 2019-06-12 ENCOUNTER — Inpatient Hospital Stay: Admit: 2019-06-12 | Payer: Medicare HMO | Admitting: Surgery

## 2019-06-12 SURGERY — COLECTOMY, PARTIAL, ROBOT-ASSISTED, LAPAROSCOPIC
Anesthesia: General

## 2019-06-25 ENCOUNTER — Encounter: Payer: Self-pay | Admitting: Physician Assistant

## 2019-06-25 ENCOUNTER — Ambulatory Visit (INDEPENDENT_AMBULATORY_CARE_PROVIDER_SITE_OTHER): Payer: Medicare HMO | Admitting: Physician Assistant

## 2019-06-25 DIAGNOSIS — I10 Essential (primary) hypertension: Secondary | ICD-10-CM

## 2019-06-25 DIAGNOSIS — G8929 Other chronic pain: Secondary | ICD-10-CM | POA: Diagnosis not present

## 2019-06-25 DIAGNOSIS — M5440 Lumbago with sciatica, unspecified side: Secondary | ICD-10-CM | POA: Diagnosis not present

## 2019-06-25 DIAGNOSIS — M546 Pain in thoracic spine: Secondary | ICD-10-CM | POA: Diagnosis not present

## 2019-06-25 MED ORDER — LISINOPRIL-HYDROCHLOROTHIAZIDE 10-12.5 MG PO TABS: 1.0000 | ORAL_TABLET | Freq: Every day | ORAL | 0 refills | Status: DC

## 2019-06-25 MED ORDER — OXYCODONE-ACETAMINOPHEN 7.5-325 MG PO TABS: 1.0000 | ORAL_TABLET | Freq: Three times a day (TID) | ORAL | 0 refills | Status: DC | PRN

## 2019-06-25 MED ORDER — GABAPENTIN 300 MG PO CAPS: 300.0000 mg | ORAL_CAPSULE | Freq: Three times a day (TID) | ORAL | 3 refills | Status: DC

## 2019-06-25 MED ORDER — AMITRIPTYLINE HCL 25 MG PO TABS: 25.0000 mg | ORAL_TABLET | Freq: Every day | ORAL | 1 refills | Status: DC

## 2019-06-25 MED ORDER — MELOXICAM 7.5 MG PO TABS: 7.5000 mg | ORAL_TABLET | Freq: Every day | ORAL | 1 refills | Status: DC

## 2019-06-25 NOTE — Progress Notes (Signed)
8  [id number]      Telephone visit  Subjective: [id number] on chronic conditions PCP: Terald Sleeper, PA-C [id number] L [name] is a 74 y.o. female calls for telephone consult today. Patient provides verbal consent for consult held via phone.  Patient is identified with 2 separate identifiers.  At this time the entire area is on COVID-19 social distancing and stay home orders are in place.  Patient is of higher risk and therefore we are performing this by a virtual method.  Location of patient: home Location of provider: HOME Others present for call: no  Hypertension This patient is doing very well with her medications and has had good blood pressure readings.  She denies any chest pain or shortness of breath.  Chronic back pain, low back pain, degenerative disc disease PAIN ASSESSMENT: Cause of pain-degenerative disc disease with osteophyte formation from thoracic and lumbar spines. Severe degenerative disc disease  2020: Osteophytes fuse almost the entire thoracic spine with chronic severe degenerative disc disease at T12-L1. Progressive severe chronic degenerative disc disease at T12-L1 and L1-2 with probable fusing osteophytes anteriorly at T12-L1. Solid fusion from L2-L5.  This patient returns for avisiton narcotic use for the above named conditions The patient has been going to Dr.Bartkofor some time. She is more time getting patient to be able to go to this appointment. It is every 3 months. I reviewed the PDMPand this does show to be true.  Current medications-Percocet 7.5 mg 3 times a day. Robaxin 500 mg 1 at bedtime Gabapentin: confusion  Stop Lyrica: weight gain Medication side effects-none Any concerns-warnings about side effects, somnolence  Pain on scale of 1-02-13-09 Frequency-Daily What increases pain-walking What makes pain Better-very low Effects on ADL -moderate some days Any change in general medical condition-none  Effectiveness of  current meds-adequate Adverse reactions form pain meds-no PMP AWARE website reviewed:Yes Any suspicious activity on PMP Aware:No MME daily dose:33.75  Contract on file8/04/2019 Last UDS8/04/2019 normal  History of overdose or risk of abuseno  ROS: Per HPI  Allergies  Allergen Reactions  . Cashew Nut Oil Hives  . Dog Epithelium Hives and Itching  . Lyrica [Pregabalin]     Weight gain  . Ondansetron Hcl Nausea And Vomiting    "made me throw up"  . Sulfa Antibiotics     "blistering lips and mouth sores"   Past Medical History:  Diagnosis Date  . Arthritis   . Chronic back pain   . Chronic constipation   . Chronic constipation   . Chronic midline low back pain with sciatica   . Colon polyp    large ascending colon polyp , scheduled for resection 02/ 2021  . Full dentures   . History of endometrial cancer 2012   s/p   TAH w/ BSO 11-02-2010  . History of recurrent UTIs   . History of sepsis    multiple times (some due to e.coli) last sepsis 07/ 2019  . History of small bowel obstruction    x2  ,  hx bowel resection  . HOH (hard of hearing)   . Hyperlipidemia   . Hypertension   . Retroperitoneal fibrosis   . Ureteral obstruction, left urologist-- dr Alinda Money   secondary to adhesions/ retroperitoneal fibrosis, treated with ureteral stent  . Vertigo   . Wears glasses     Current Outpatient Medications:  .  amitriptyline (ELAVIL) 25 MG tablet, Take 1 tablet (25 mg total) by mouth at bedtime., Disp: 90 tablet, Rfl: 1 .  gabapentin (  NEURONTIN) 300 MG capsule, Take 1 capsule (300 mg total) by mouth 3 (three) times daily., Disp: 90 capsule, Rfl: 3 .  lisinopril-hydrochlorothiazide (ZESTORETIC) 10-12.5 MG tablet, Take 1 tablet by mouth daily., Disp: 90 tablet, Rfl: 0 .  meloxicam (MOBIC) 7.5 MG tablet, Take 1 tablet (7.5 mg total) by mouth daily., Disp: 90 tablet, Rfl: 1 .  methocarbamol (ROBAXIN) 500 MG tablet, Take 500 mg by mouth at bedtime. , Disp: , Rfl:  .   oxyCODONE-acetaminophen (PERCOCET) 7.5-325 MG tablet, Take 1 tablet by mouth every 8 (eight) hours as needed for severe pain., Disp: 90 tablet, Rfl: 0 .  oxyCODONE-acetaminophen (PERCOCET) 7.5-325 MG tablet, Take 1 tablet by mouth every 8 (eight) hours as needed for severe pain., Disp: 90 tablet, Rfl: 0 .  oxyCODONE-acetaminophen (PERCOCET) 7.5-325 MG tablet, Take 1 tablet by mouth every 8 (eight) hours as needed for severe pain., Disp: 90 tablet, Rfl: 0 .  Sennosides (EX-LAX PO), Take 1 tablet by mouth 2 (two) times a week. , Disp: , Rfl:   Assessment/ Plan: 74 y.o. female   1. Chronic midline low back pain with sciatica, sciatica laterality unspecified - oxyCODONE-acetaminophen (PERCOCET) 7.5-325 MG tablet; Take 1 tablet by mouth every 8 (eight) hours as needed for severe pain.  Dispense: 90 tablet; Refill: 0 - oxyCODONE-acetaminophen (PERCOCET) 7.5-325 MG tablet; Take 1 tablet by mouth every 8 (eight) hours as needed for severe pain.  Dispense: 90 tablet; Refill: 0 - oxyCODONE-acetaminophen (PERCOCET) 7.5-325 MG tablet; Take 1 tablet by mouth every 8 (eight) hours as needed for severe pain.  Dispense: 90 tablet; Refill: 0 - gabapentin (NEURONTIN) 300 MG capsule; Take 1 capsule (300 mg total) by mouth 3 (three) times daily.  Dispense: 90 capsule; Refill: 3 - meloxicam (MOBIC) 7.5 MG tablet; Take 1 tablet (7.5 mg total) by mouth daily.  Dispense: 90 tablet; Refill: 1  2. Chronic midline thoracic back pain - oxyCODONE-acetaminophen (PERCOCET) 7.5-325 MG tablet; Take 1 tablet by mouth every 8 (eight) hours as needed for severe pain.  Dispense: 90 tablet; Refill: 0 - oxyCODONE-acetaminophen (PERCOCET) 7.5-325 MG tablet; Take 1 tablet by mouth every 8 (eight) hours as needed for severe pain.  Dispense: 90 tablet; Refill: 0 - oxyCODONE-acetaminophen (PERCOCET) 7.5-325 MG tablet; Take 1 tablet by mouth every 8 (eight) hours as needed for severe pain.  Dispense: 90 tablet; Refill: 0 - gabapentin  (NEURONTIN) 300 MG capsule; Take 1 capsule (300 mg total) by mouth 3 (three) times daily.  Dispense: 90 capsule; Refill: 3 - meloxicam (MOBIC) 7.5 MG tablet; Take 1 tablet (7.5 mg total) by mouth daily.  Dispense: 90 tablet; Refill: 1  3. Essential hypertension - lisinopril-hydrochlorothiazide (ZESTORETIC) 10-12.5 MG tablet; Take 1 tablet by mouth daily.  Dispense: 90 tablet; Refill: 0  4. Other chronic pain - meloxicam (MOBIC) 7.5 MG tablet; Take 1 tablet (7.5 mg total) by mouth daily.  Dispense: 90 tablet; Refill: 1 - amitriptyline (ELAVIL) 25 MG tablet; Take 1 tablet (25 mg total) by mouth at bedtime.  Dispense: 90 tablet; Refill: 1   Return in about 3 months (around 09/22/2019) for recheck medications, follow up pain meds.  Continue all other maintenance medications as listed above.  Start time: 10:30 AM End time: 10:44 AM  Meds ordered this encounter  Medications  . oxyCODONE-acetaminophen (PERCOCET) 7.5-325 MG tablet    Sig: Take 1 tablet by mouth every 8 (eight) hours as needed for severe pain.    Dispense:  90 tablet    Refill:  0  Fill 04/20/2019    Order Specific Question:   Supervising Provider    Answer:   Janora Norlander [id number]  . oxyCODONE-acetaminophen (PERCOCET) 7.5-325 MG tablet    Sig: Take 1 tablet by mouth every 8 (eight) hours as needed for severe pain.    Dispense:  90 tablet    Refill:  0    Fill 05/20/2019    Order Specific Question:   Supervising Provider    Answer:   Janora Norlander [id number]  . oxyCODONE-acetaminophen (PERCOCET) 7.5-325 MG tablet    Sig: Take 1 tablet by mouth every 8 (eight) hours as needed for severe pain.    Dispense:  90 tablet    Refill:  0    Fill 04/03/19    Order Specific Question:   Supervising Provider    Answer:   Janora Norlander [id number]  . gabapentin (NEURONTIN) 300 MG capsule    Sig: Take 1 capsule (300 mg total) by mouth 3 (three) times daily.    Dispense:  90 capsule    Refill:  3    Order  Specific Question:   Supervising Provider    Answer:   Janora Norlander [id number]  . lisinopril-hydrochlorothiazide (ZESTORETIC) 10-12.5 MG tablet    Sig: Take 1 tablet by mouth daily.    Dispense:  90 tablet    Refill:  0    Order Specific Question:   Supervising Provider    Answer:   Janora Norlander [id number]  . meloxicam (MOBIC) 7.5 MG tablet    Sig: Take 1 tablet (7.5 mg total) by mouth daily.    Dispense:  90 tablet    Refill:  1    Order Specific Question:   Supervising Provider    Answer:   Janora Norlander [id number]  . amitriptyline (ELAVIL) 25 MG tablet    Sig: Take 1 tablet (25 mg total) by mouth at bedtime.    Dispense:  90 tablet    Refill:  1    Order Specific Question:   Supervising Provider    Answer:   Janora Norlander [id number]    Particia Nearing PA-C [place] [phone]

## 2019-07-16 DIAGNOSIS — R1084 Generalized abdominal pain: Secondary | ICD-10-CM | POA: Diagnosis not present

## 2019-07-16 DIAGNOSIS — N3020 Other chronic cystitis without hematuria: Secondary | ICD-10-CM | POA: Diagnosis not present

## 2019-08-14 ENCOUNTER — Ambulatory Visit: Payer: Self-pay | Admitting: Surgery

## 2019-08-14 NOTE — Patient Instructions (Signed)
DUE TO COVID-19 ONLY ONE VISITOR IS ALLOWED TO COME WITH YOU AND STAY IN THE WAITING ROOM ONLY DURING PRE OP AND PROCEDURE DAY OF SURGERY. THE 1 VISITOR MAY VISIT WITH YOU AFTER SURGERY IN YOUR PRIVATE ROOM DURING VISITING HOURS ONLY!  YOU NEED TO HAVE A COVID 19 TEST ON__4/13_____ @_8 :30______, THIS TEST MUST BE DONE BEFORE SURGERY, COME  [place] , [id number].  ([place]) ONCE YOUR COVID TEST IS COMPLETED, PLEASE BEGIN THE QUARANTINE INSTRUCTIONS AS OUTLINED IN YOUR HANDOUT.                Carnelia Keer Obar    Your procedure is scheduled on: 08/23/19   Report to [facility] Main  Entrance   Report to Short Stay at 5:30 AM     Call this number if you have problems the morning of surgery [phone]   Follow all instructions from Dr. Clyda Greener office for the bowel prep.  Drink plenty of fluids the day of the prep to prevent dehydration.  Drink 2 pre op insure drinks at 10:00 PM the night before surgery.  No food after Midnight. You can have clear liquids until 4:30 AM the morning of surgery.  Drink the final ensure at 4:00 AM then nothing by mouth after 4:30 AM     BRUSH YOUR TEETH MORNING OF SURGERY AND RINSE YOUR MOUTH OUT, NO CHEWING GUM CANDY OR MINTS.     Take these medicines the morning of surgery with A SIP OF WATER: Gabapentin, pain med if needed                                 You may not have any metal on your body including hair pins and              piercings  Do not wear jewelry, make-up, lotions, powders or perfumes, deodorant             Do not wear nail polish on your fingernails.  Do not shave  48 hours prior to surgery.              Do not bring valuables to the hospital. [place].  Contacts, dentures or bridgework may not be worn into surgery.      Name and phone number of your driver:  Special Instructions: N/A              Please read over the following  fact sheets you were given: _____________________________________________________________________             [facility] - Preparing for Surgery Before surgery, you can play an important role.   Because skin is not sterile, your skin needs to be as free of germs as possible.   You can reduce the number of germs on your skin by washing with CHG (chlorahexidine gluconate) soap before surgery.   CHG is an antiseptic cleaner which kills germs and bonds with the skin to continue killing germs even after washing. Please DO NOT use if you have an allergy to CHG or antibacterial soaps.   If your skin becomes reddened/irritated stop using the CHG and inform your nurse when you arrive at Short Stay. Do not shave (including legs and underarms) for at least 48 hours prior to the first CHG  shower.   . Please follow these instructions carefully:  1.  Shower with CHG Soap the night before surgery and the  morning of Surgery.  2.  If you choose to wash your hair, wash your hair first as usual with your  normal  shampoo.  3.  After you shampoo, rinse your hair and body thoroughly to remove the  shampoo.                                        4.  Use CHG as you would any other liquid soap.  You can apply chg directly  to the skin and wash                       Gently with a scrungie or clean washcloth.  5.  Apply the CHG Soap to your body ONLY FROM THE NECK DOWN.   Do not use on face/ open                           Wound or open sores. Avoid contact with eyes, ears mouth and genitals (private parts).                       Wash face,  Genitals (private parts) with your normal soap.             6.  Wash thoroughly, paying special attention to the area where your surgery  will be performed.  7.  Thoroughly rinse your body with warm water from the neck down.  8.  DO NOT shower/wash with your normal soap after using and rinsing off  the CHG Soap.             9.  Pat yourself dry with a clean towel.             10.  Wear clean pajamas.            11.  Place clean sheets on your bed the night of your first shower and do not  sleep with pets. Day of Surgery : Do not apply any lotions/deodorants the morning of surgery.  Please wear clean clothes to the hospital/surgery center.  FAILURE TO FOLLOW THESE INSTRUCTIONS MAY RESULT IN THE CANCELLATION OF YOUR SURGERY PATIENT SIGNATURE_________________________________  NURSE SIGNATURE__________________________________  ________________________________________________________________________   Adam Phenix  An incentive spirometer is a tool that can help keep your lungs clear and active. This tool measures how well you are filling your lungs with each breath. Taking long deep breaths may help reverse or decrease the chance of developing breathing (pulmonary) problems (especially infection) following:  A long period of time when you are unable to move or be active. BEFORE THE PROCEDURE   If the spirometer includes an indicator to show your best effort, your nurse or respiratory therapist will set it to a desired goal.  If possible, sit up straight or lean slightly forward. Try not to slouch.  Hold the incentive spirometer in an upright position. INSTRUCTIONS FOR USE  1. Sit on the edge of your bed if possible, or sit up as far as you can in bed or on a chair. 2. Hold the incentive spirometer in an upright position. 3. Breathe out normally. 4. Place the mouthpiece in your mouth and seal your lips tightly around it. 5.  Breathe in slowly and as deeply as possible, raising the piston or the ball toward the top of the column. 6. Hold your breath for 3-5 seconds or for as long as possible. Allow the piston or ball to fall to the bottom of the column. 7. Remove the mouthpiece from your mouth and breathe out normally. 8. Rest for a few seconds and repeat Steps 1 through 7 at least 10 times every 1-2 hours when you are awake. Take your time and take a  few normal breaths between deep breaths. 9. The spirometer may include an indicator to show your best effort. Use the indicator as a goal to work toward during each repetition. 10. After each set of 10 deep breaths, practice coughing to be sure your lungs are clear. If you have an incision (the cut made at the time of surgery), support your incision when coughing by placing a pillow or rolled up towels firmly against it. Once you are able to get out of bed, walk around indoors and cough well. You may stop using the incentive spirometer when instructed by your caregiver.  RISKS AND COMPLICATIONS  Take your time so you do not get dizzy or light-headed.  If you are in pain, you may need to take or ask for pain medication before doing incentive spirometry. It is harder to take a deep breath if you are having pain. AFTER USE  Rest and breathe slowly and easily.  It can be helpful to keep track of a log of your progress. Your caregiver can provide you with a simple table to help with this. If you are using the spirometer at home, follow these instructions: [place] IF:   You are having difficultly using the spirometer.  You have trouble using the spirometer as often as instructed.  Your pain medication is not giving enough relief while using the spirometer.  You develop fever of 100.5 F (38.1 C) or higher. SEEK IMMEDIATE MEDICAL CARE IF:   You cough up bloody sputum that had not been present before.  You develop fever of 102 F (38.9 C) or greater.  You develop worsening pain at or near the incision site. MAKE SURE YOU:   Understand these instructions.  Will watch your condition.  Will get help right away if you are not doing well or get worse. Document Released: 09/05/2006 Document Revised: 07/18/2011 Document Reviewed: 11/06/2006 [facility] Patient Information 2014 [place], [place].   ________________________________________________________________________

## 2019-08-15 ENCOUNTER — Other Ambulatory Visit: Payer: Self-pay

## 2019-08-15 ENCOUNTER — Encounter (HOSPITAL_COMMUNITY): Payer: Self-pay

## 2019-08-15 ENCOUNTER — Encounter (HOSPITAL_COMMUNITY)
Admission: RE | Admit: 2019-08-15 | Discharge: 2019-08-15 | Disposition: A | Discharge status: Home / Self Care | Payer: Medicare HMO | Source: Ambulatory Visit | Attending: Surgery | Admitting: Surgery

## 2019-08-15 DIAGNOSIS — Z01812 Encounter for preprocedural laboratory examination: Secondary | ICD-10-CM | POA: Diagnosis present

## 2019-08-15 LAB — CBC
HCT: 42.2 % (ref 36.0–46.0)
Hemoglobin: 13.5 g/dL (ref 12.0–15.0)
MCH: 31.8 pg (ref 26.0–34.0)
MCHC: 32 g/dL (ref 30.0–36.0)
MCV: 99.5 fL (ref 80.0–100.0)
Platelets: 296 10*3/uL (ref 150–400)
RBC: 4.24 MIL/uL (ref 3.87–5.11)
RDW: 12.6 % (ref 11.5–15.5)
WBC: 9.5 10*3/uL (ref 4.0–10.5)
nRBC: 0 % (ref 0.0–0.2)

## 2019-08-15 LAB — BASIC METABOLIC PANEL
Anion gap: 9 (ref 5–15)
BUN: 13 mg/dL (ref 8–23)
CO2: 25 mmol/L (ref 22–32)
Calcium: 8.8 mg/dL — ABNORMAL LOW (ref 8.9–10.3)
Chloride: 104 mmol/L (ref 98–111)
Creatinine, Ser: 0.89 mg/dL (ref 0.44–1.00)
GFR calc Af Amer: >60 mL/min (ref >60)
GFR calc non Af Amer: >60 mL/min (ref >60)
Glucose, Bld: 169 mg/dL — ABNORMAL HIGH (ref 70–99)
Potassium: 3.8 mmol/L (ref 3.5–5.1)
Sodium: 138 mmol/L (ref 135–145)

## 2019-08-15 NOTE — Progress Notes (Signed)
PCP - Dr. Loanne Drilling Cardiologist - no  Chest x-ray - no EKG - 11/30/18 Stress Test - no ECHO - no Cardiac Cath - no  Sleep Study - no CPAP -   Fasting Blood Sugar - NA Checks Blood Sugar _____ times a day  Blood Thinner Instructions:NA Aspirin Instructions: Last Dose:  Anesthesia review:   Patient denies shortness of breath, fever, cough and chest pain at PAT appointment yes  Patient verbalized understanding of instructions that were given to them at the PAT appointment. Patient was also instructed that they will need to review over the PAT instructions again at home before surgery. yes

## 2019-08-16 LAB — HEMOGLOBIN A1C
Hgb A1c MFr Bld: 5.8 % — ABNORMAL HIGH (ref 4.8–5.6)
Mean Plasma Glucose: 120 mg/dL

## 2019-08-20 ENCOUNTER — Other Ambulatory Visit (HOSPITAL_COMMUNITY): Payer: Medicare HMO

## 2019-08-20 ENCOUNTER — Other Ambulatory Visit (HOSPITAL_COMMUNITY)
Admission: RE | Admit: 2019-08-20 | Discharge: 2019-08-20 | Disposition: A | Discharge status: Home / Self Care | Payer: Medicare HMO | Source: Ambulatory Visit | Attending: Surgery | Admitting: Surgery

## 2019-08-20 DIAGNOSIS — Z01812 Encounter for preprocedural laboratory examination: Secondary | ICD-10-CM | POA: Insufficient documentation

## 2019-08-20 DIAGNOSIS — Z20822 Contact with and (suspected) exposure to covid-19: Secondary | ICD-10-CM | POA: Insufficient documentation

## 2019-08-20 LAB — SARS CORONAVIRUS 2 (TAT 6-24 HRS): SARS Coronavirus 2: NEGATIVE

## 2019-08-22 MED ORDER — BUPIVACAINE LIPOSOME 1.3 % IJ SUSP
20.0000 mL | INTRAMUSCULAR | Status: DC
  Filled 2019-08-22: qty 20

## 2019-08-23 ENCOUNTER — Inpatient Hospital Stay (HOSPITAL_COMMUNITY): Payer: Medicare HMO | Admitting: Certified Registered Nurse Anesthetist

## 2019-08-23 ENCOUNTER — Other Ambulatory Visit: Payer: Self-pay

## 2019-08-23 ENCOUNTER — Encounter (HOSPITAL_COMMUNITY): Payer: Self-pay | Admitting: Surgery

## 2019-08-23 ENCOUNTER — Inpatient Hospital Stay (HOSPITAL_COMMUNITY)
Admission: RE | Admit: 2019-08-23 | Discharge: 2019-08-25 | DRG: 330 | Disposition: A | Discharge status: Home / Self Care | Payer: Medicare HMO | Attending: Surgery | Admitting: Surgery

## 2019-08-23 ENCOUNTER — Encounter (HOSPITAL_COMMUNITY)
Admission: RE | Disposition: A | Discharge status: Home / Self Care | Payer: Self-pay | Source: Home / Self Care | Attending: Surgery

## 2019-08-23 DIAGNOSIS — Z9049 Acquired absence of other specified parts of digestive tract: Secondary | ICD-10-CM

## 2019-08-23 DIAGNOSIS — G8929 Other chronic pain: Secondary | ICD-10-CM | POA: Diagnosis not present

## 2019-08-23 DIAGNOSIS — M545 Low back pain: Secondary | ICD-10-CM | POA: Diagnosis present

## 2019-08-23 DIAGNOSIS — Z6838 Body mass index (BMI) 38.0-38.9, adult: Secondary | ICD-10-CM

## 2019-08-23 DIAGNOSIS — D122 Benign neoplasm of ascending colon: Secondary | ICD-10-CM | POA: Diagnosis not present

## 2019-08-23 DIAGNOSIS — Z882 Allergy status to sulfonamides status: Secondary | ICD-10-CM | POA: Diagnosis not present

## 2019-08-23 DIAGNOSIS — Z79891 Long term (current) use of opiate analgesic: Secondary | ICD-10-CM

## 2019-08-23 DIAGNOSIS — Z791 Long term (current) use of non-steroidal anti-inflammatories (NSAID): Secondary | ICD-10-CM | POA: Diagnosis not present

## 2019-08-23 DIAGNOSIS — Q438 Other specified congenital malformations of intestine: Secondary | ICD-10-CM

## 2019-08-23 DIAGNOSIS — Z20822 Contact with and (suspected) exposure to covid-19: Secondary | ICD-10-CM | POA: Diagnosis not present

## 2019-08-23 DIAGNOSIS — Z96643 Presence of artificial hip joint, bilateral: Secondary | ICD-10-CM | POA: Diagnosis present

## 2019-08-23 DIAGNOSIS — I10 Essential (primary) hypertension: Secondary | ICD-10-CM | POA: Diagnosis present

## 2019-08-23 DIAGNOSIS — Z96651 Presence of right artificial knee joint: Secondary | ICD-10-CM | POA: Diagnosis present

## 2019-08-23 DIAGNOSIS — E785 Hyperlipidemia, unspecified: Secondary | ICD-10-CM | POA: Diagnosis present

## 2019-08-23 DIAGNOSIS — K660 Peritoneal adhesions (postprocedural) (postinfection): Secondary | ICD-10-CM | POA: Diagnosis present

## 2019-08-23 DIAGNOSIS — K635 Polyp of colon: Secondary | ICD-10-CM | POA: Diagnosis present

## 2019-08-23 DIAGNOSIS — Z8542 Personal history of malignant neoplasm of other parts of uterus: Secondary | ICD-10-CM | POA: Diagnosis not present

## 2019-08-23 DIAGNOSIS — Z79899 Other long term (current) drug therapy: Secondary | ICD-10-CM

## 2019-08-23 DIAGNOSIS — Z833 Family history of diabetes mellitus: Secondary | ICD-10-CM | POA: Diagnosis not present

## 2019-08-23 DIAGNOSIS — Z905 Acquired absence of kidney: Secondary | ICD-10-CM | POA: Diagnosis not present

## 2019-08-23 DIAGNOSIS — Z90722 Acquired absence of ovaries, bilateral: Secondary | ICD-10-CM | POA: Diagnosis not present

## 2019-08-23 LAB — TYPE AND SCREEN
ABO/RH(D): AB POS
Antibody Screen: NEGATIVE

## 2019-08-23 LAB — GLUCOSE, CAPILLARY: Glucose-Capillary: 120 mg/dL — ABNORMAL HIGH (ref 70–99)

## 2019-08-23 SURGERY — COLECTOMY, PARTIAL, ROBOT-ASSISTED, LAPAROSCOPIC
Anesthesia: General | Site: Abdomen

## 2019-08-23 MED ORDER — PROMETHAZINE HCL 25 MG/ML IJ SOLN: 6.2500 mg | INTRAMUSCULAR | Status: DC | PRN

## 2019-08-23 MED ORDER — LISINOPRIL-HYDROCHLOROTHIAZIDE 10-12.5 MG PO TABS: 1.0000 | ORAL_TABLET | Freq: Every day | ORAL | Status: DC

## 2019-08-23 MED ORDER — HYDROCHLOROTHIAZIDE 12.5 MG PO CAPS
12.5000 mg | ORAL_CAPSULE | Freq: Every day | ORAL | Status: DC
  Administered 2019-08-23 – 2019-08-25 (×3): 12.5 mg via ORAL
  Filled 2019-08-23 (×3): qty 1

## 2019-08-23 MED ORDER — POLYETHYLENE GLYCOL 3350 17 GM/SCOOP PO POWD: 1.0000 | Freq: Once | ORAL | Status: DC

## 2019-08-23 MED ORDER — LACTATED RINGERS IV SOLN: INTRAVENOUS | Status: AC

## 2019-08-23 MED ORDER — ALUM & MAG HYDROXIDE-SIMETH 200-200-20 MG/5ML PO SUSP: 30.0000 mL | Freq: Four times a day (QID) | ORAL | Status: DC | PRN

## 2019-08-23 MED ORDER — PHENYLEPHRINE 40 MCG/ML (10ML) SYRINGE FOR IV PUSH (FOR BLOOD PRESSURE SUPPORT)
PREFILLED_SYRINGE | INTRAVENOUS | Status: AC
  Filled 2019-08-23: qty 10

## 2019-08-23 MED ORDER — ACETAMINOPHEN 500 MG PO TABS
1000.0000 mg | ORAL_TABLET | ORAL | Status: AC
  Administered 2019-08-23: 1000 mg via ORAL
  Filled 2019-08-23: qty 2

## 2019-08-23 MED ORDER — NEOMYCIN SULFATE 500 MG PO TABS: 1000.0000 mg | ORAL_TABLET | ORAL | Status: DC

## 2019-08-23 MED ORDER — SODIUM CHLORIDE 0.9 % IV SOLN
2.0000 g | Freq: Two times a day (BID) | INTRAVENOUS | Status: AC
  Administered 2019-08-23: 2 g via INTRAVENOUS
  Filled 2019-08-23: qty 2

## 2019-08-23 MED ORDER — ACETAMINOPHEN 500 MG PO TABS
1000.0000 mg | ORAL_TABLET | Freq: Four times a day (QID) | ORAL | Status: DC
  Administered 2019-08-23 – 2019-08-25 (×7): 1000 mg via ORAL
  Filled 2019-08-23 (×8): qty 2

## 2019-08-23 MED ORDER — ALBUMIN HUMAN 5 % IV SOLN
INTRAVENOUS | Status: AC
  Filled 2019-08-23: qty 250

## 2019-08-23 MED ORDER — GABAPENTIN 100 MG PO CAPS: 200.0000 mg | ORAL_CAPSULE | Freq: Three times a day (TID) | ORAL | Status: DC

## 2019-08-23 MED ORDER — ACETAMINOPHEN 325 MG PO TABS: 325.0000 mg | ORAL_TABLET | Freq: Once | ORAL | Status: DC | PRN

## 2019-08-23 MED ORDER — LIP MEDEX EX OINT
1.0000 "application " | TOPICAL_OINTMENT | Freq: Two times a day (BID) | CUTANEOUS | Status: DC
  Administered 2019-08-23 – 2019-08-25 (×4): 1 via TOPICAL
  Filled 2019-08-23: qty 7

## 2019-08-23 MED ORDER — BUPIVACAINE LIPOSOME 1.3 % IJ SUSP
INTRAMUSCULAR | Status: DC | PRN
  Administered 2019-08-23: 20 mL

## 2019-08-23 MED ORDER — DEXAMETHASONE SODIUM PHOSPHATE 10 MG/ML IJ SOLN
INTRAMUSCULAR | Status: DC | PRN
  Administered 2019-08-23: 10 mg via INTRAVENOUS

## 2019-08-23 MED ORDER — PROPOFOL 10 MG/ML IV BOLUS
INTRAVENOUS | Status: AC
  Filled 2019-08-23: qty 20

## 2019-08-23 MED ORDER — PHENYLEPHRINE HCL (PRESSORS) 10 MG/ML IV SOLN
INTRAVENOUS | Status: AC
  Filled 2019-08-23: qty 2

## 2019-08-23 MED ORDER — PHENYLEPHRINE 40 MCG/ML (10ML) SYRINGE FOR IV PUSH (FOR BLOOD PRESSURE SUPPORT)
PREFILLED_SYRINGE | INTRAVENOUS | Status: AC
  Filled 2019-08-23: qty 40

## 2019-08-23 MED ORDER — PROCHLORPERAZINE EDISYLATE 10 MG/2ML IJ SOLN: 5.0000 mg | Freq: Four times a day (QID) | INTRAMUSCULAR | Status: DC | PRN

## 2019-08-23 MED ORDER — ALVIMOPAN 12 MG PO CAPS: 12.0000 mg | ORAL_CAPSULE | Freq: Two times a day (BID) | ORAL | Status: DC

## 2019-08-23 MED ORDER — ROCURONIUM BROMIDE 10 MG/ML (PF) SYRINGE
PREFILLED_SYRINGE | INTRAVENOUS | Status: DC | PRN
  Administered 2019-08-23: 30 mg via INTRAVENOUS
  Administered 2019-08-23: 70 mg via INTRAVENOUS
  Administered 2019-08-23: 30 mg via INTRAVENOUS

## 2019-08-23 MED ORDER — DIPHENHYDRAMINE HCL 50 MG/ML IJ SOLN: 12.5000 mg | Freq: Four times a day (QID) | INTRAMUSCULAR | Status: DC | PRN

## 2019-08-23 MED ORDER — LACTATED RINGERS IR SOLN
Status: DC | PRN
  Administered 2019-08-23: 2000 mL

## 2019-08-23 MED ORDER — ALBUMIN HUMAN 5 % IV SOLN: INTRAVENOUS | Status: DC | PRN

## 2019-08-23 MED ORDER — ONDANSETRON HCL 4 MG/2ML IJ SOLN: 4.0000 mg | Freq: Four times a day (QID) | INTRAMUSCULAR | Status: DC | PRN

## 2019-08-23 MED ORDER — MAGIC MOUTHWASH
15.0000 mL | Freq: Four times a day (QID) | ORAL | Status: DC | PRN
  Filled 2019-08-23: qty 15

## 2019-08-23 MED ORDER — GABAPENTIN 300 MG PO CAPS
300.0000 mg | ORAL_CAPSULE | Freq: Three times a day (TID) | ORAL | Status: DC
  Administered 2019-08-23 – 2019-08-25 (×7): 300 mg via ORAL
  Filled 2019-08-23 (×7): qty 1

## 2019-08-23 MED ORDER — LACTATED RINGERS IV SOLN: INTRAVENOUS | Status: DC

## 2019-08-23 MED ORDER — BUPIVACAINE HCL (PF) 0.25 % IJ SOLN
INTRAMUSCULAR | Status: AC
  Filled 2019-08-23: qty 60

## 2019-08-23 MED ORDER — DIPHENHYDRAMINE HCL 12.5 MG/5ML PO ELIX: 12.5000 mg | ORAL_SOLUTION | Freq: Four times a day (QID) | ORAL | Status: DC | PRN

## 2019-08-23 MED ORDER — BSS IO SOLN
15.0000 mL | Freq: Once | INTRAOCULAR | Status: AC
  Administered 2019-08-23: 15 mL
  Filled 2019-08-23: qty 15

## 2019-08-23 MED ORDER — LISINOPRIL 10 MG PO TABS
10.0000 mg | ORAL_TABLET | Freq: Every day | ORAL | Status: DC
  Administered 2019-08-23 – 2019-08-25 (×3): 10 mg via ORAL
  Filled 2019-08-23 (×3): qty 1

## 2019-08-23 MED ORDER — KETOROLAC TROMETHAMINE 0.5 % OP SOLN
1.0000 [drp] | Freq: Three times a day (TID) | OPHTHALMIC | Status: DC | PRN
  Administered 2019-08-23: 1 [drp] via OPHTHALMIC
  Filled 2019-08-23: qty 5

## 2019-08-23 MED ORDER — FENTANYL CITRATE (PF) 100 MCG/2ML IJ SOLN
INTRAMUSCULAR | Status: AC
  Filled 2019-08-23: qty 2

## 2019-08-23 MED ORDER — KETAMINE HCL 10 MG/ML IJ SOLN
INTRAMUSCULAR | Status: AC
  Filled 2019-08-23: qty 1

## 2019-08-23 MED ORDER — HYDROMORPHONE HCL 1 MG/ML IJ SOLN
0.5000 mg | INTRAMUSCULAR | Status: DC | PRN
  Administered 2019-08-23 – 2019-08-24 (×2): 1 mg via INTRAVENOUS
  Filled 2019-08-23 (×2): qty 1

## 2019-08-23 MED ORDER — PHENYLEPHRINE HCL-NACL 20-0.9 MG/250ML-% IV SOLN
INTRAVENOUS | Status: DC | PRN
  Administered 2019-08-23: 40 ug/min via INTRAVENOUS

## 2019-08-23 MED ORDER — LIDOCAINE 2% (20 MG/ML) 5 ML SYRINGE
INTRAMUSCULAR | Status: DC | PRN
  Administered 2019-08-23: 60 mg via INTRAVENOUS

## 2019-08-23 MED ORDER — PROPOFOL 10 MG/ML IV BOLUS
INTRAVENOUS | Status: DC | PRN
  Administered 2019-08-23: 90 mg via INTRAVENOUS

## 2019-08-23 MED ORDER — BISACODYL 5 MG PO TBEC: 20.0000 mg | DELAYED_RELEASE_TABLET | Freq: Once | ORAL | Status: DC

## 2019-08-23 MED ORDER — ACETAMINOPHEN 160 MG/5ML PO SOLN: 325.0000 mg | Freq: Once | ORAL | Status: DC | PRN

## 2019-08-23 MED ORDER — EPHEDRINE SULFATE-NACL 50-0.9 MG/10ML-% IV SOSY
PREFILLED_SYRINGE | INTRAVENOUS | Status: DC | PRN
  Administered 2019-08-23 (×2): 10 mg via INTRAVENOUS
  Administered 2019-08-23: 25 mg via INTRAVENOUS
  Administered 2019-08-23 (×3): 10 mg via INTRAVENOUS
  Administered 2019-08-23: 15 mg via INTRAVENOUS
  Administered 2019-08-23: 10 mg via INTRAVENOUS

## 2019-08-23 MED ORDER — ENOXAPARIN SODIUM 40 MG/0.4ML ~~LOC~~ SOLN
40.0000 mg | SUBCUTANEOUS | Status: DC
  Administered 2019-08-24 – 2019-08-25 (×2): 40 mg via SUBCUTANEOUS
  Filled 2019-08-23 (×2): qty 0.4

## 2019-08-23 MED ORDER — GABAPENTIN 300 MG PO CAPS
300.0000 mg | ORAL_CAPSULE | ORAL | Status: AC
  Administered 2019-08-23: 300 mg via ORAL
  Filled 2019-08-23: qty 1

## 2019-08-23 MED ORDER — SUGAMMADEX SODIUM 200 MG/2ML IV SOLN
INTRAVENOUS | Status: DC | PRN
  Administered 2019-08-23: 200 mg via INTRAVENOUS

## 2019-08-23 MED ORDER — POLYMYXIN B-TRIMETHOPRIM 10000-0.1 UNIT/ML-% OP SOLN
1.0000 [drp] | Freq: Three times a day (TID) | OPHTHALMIC | Status: DC
  Administered 2019-08-23: 1 [drp] via OPHTHALMIC
  Filled 2019-08-23: qty 10

## 2019-08-23 MED ORDER — MEPERIDINE HCL 50 MG/ML IJ SOLN: 6.2500 mg | INTRAMUSCULAR | Status: DC | PRN

## 2019-08-23 MED ORDER — 0.9 % SODIUM CHLORIDE (POUR BTL) OPTIME
TOPICAL | Status: DC | PRN
  Administered 2019-08-23: 11:00:00 2000 mL

## 2019-08-23 MED ORDER — LIDOCAINE 2% (20 MG/ML) 5 ML SYRINGE
INTRAMUSCULAR | Status: AC
  Filled 2019-08-23: qty 5

## 2019-08-23 MED ORDER — ROCURONIUM BROMIDE 10 MG/ML (PF) SYRINGE
PREFILLED_SYRINGE | INTRAVENOUS | Status: AC
  Filled 2019-08-23: qty 10

## 2019-08-23 MED ORDER — SODIUM CHLORIDE 0.9 % IV SOLN
2.0000 g | INTRAVENOUS | Status: AC
  Administered 2019-08-23: 2 g via INTRAVENOUS
  Filled 2019-08-23: qty 2

## 2019-08-23 MED ORDER — FENTANYL CITRATE (PF) 100 MCG/2ML IJ SOLN
INTRAMUSCULAR | Status: DC | PRN
  Administered 2019-08-23: 100 ug via INTRAVENOUS

## 2019-08-23 MED ORDER — METRONIDAZOLE 500 MG PO TABS: 1000.0000 mg | ORAL_TABLET | ORAL | Status: DC

## 2019-08-23 MED ORDER — SUCCINYLCHOLINE CHLORIDE 20 MG/ML IJ SOLN
INTRAMUSCULAR | Status: DC | PRN
  Administered 2019-08-23: 120 mg via INTRAVENOUS

## 2019-08-23 MED ORDER — LIDOCAINE HCL 2 % IJ SOLN
INTRAMUSCULAR | Status: AC
  Filled 2019-08-23: qty 40

## 2019-08-23 MED ORDER — BUPIVACAINE HCL (PF) 0.25 % IJ SOLN
INTRAMUSCULAR | Status: DC | PRN
  Administered 2019-08-23: 60 mL

## 2019-08-23 MED ORDER — METHOCARBAMOL 500 MG PO TABS
500.0000 mg | ORAL_TABLET | Freq: Every day | ORAL | Status: DC
  Administered 2019-08-23 – 2019-08-24 (×2): 500 mg via ORAL
  Filled 2019-08-23 (×3): qty 1

## 2019-08-23 MED ORDER — PROCHLORPERAZINE MALEATE 10 MG PO TABS
10.0000 mg | ORAL_TABLET | Freq: Four times a day (QID) | ORAL | Status: DC | PRN
  Filled 2019-08-23: qty 1

## 2019-08-23 MED ORDER — FENTANYL CITRATE (PF) 100 MCG/2ML IJ SOLN
INTRAMUSCULAR | Status: AC
  Administered 2019-08-23: 50 ug via INTRAVENOUS
  Filled 2019-08-23: qty 4

## 2019-08-23 MED ORDER — AMITRIPTYLINE HCL 25 MG PO TABS
25.0000 mg | ORAL_TABLET | Freq: Every day | ORAL | Status: DC
  Administered 2019-08-23 – 2019-08-24 (×2): 25 mg via ORAL
  Filled 2019-08-23 (×4): qty 1

## 2019-08-23 MED ORDER — TRAMADOL HCL 50 MG PO TABS: 50.0000 mg | ORAL_TABLET | Freq: Four times a day (QID) | ORAL | Status: DC | PRN

## 2019-08-23 MED ORDER — KETAMINE HCL 50 MG/ML IJ SOLN
INTRAMUSCULAR | Status: DC | PRN
  Administered 2019-08-23: 30 mg via INTRAMUSCULAR

## 2019-08-23 MED ORDER — ACETAMINOPHEN 10 MG/ML IV SOLN: 1000.0000 mg | Freq: Once | INTRAVENOUS | Status: DC | PRN

## 2019-08-23 MED ORDER — ONDANSETRON HCL 4 MG PO TABS: 4.0000 mg | ORAL_TABLET | Freq: Four times a day (QID) | ORAL | Status: DC | PRN

## 2019-08-23 MED ORDER — MELOXICAM 7.5 MG PO TABS
7.5000 mg | ORAL_TABLET | Freq: Every day | ORAL | Status: DC
  Administered 2019-08-23 – 2019-08-25 (×3): 7.5 mg via ORAL
  Filled 2019-08-23 (×3): qty 1

## 2019-08-23 MED ORDER — PHENYLEPHRINE 40 MCG/ML (10ML) SYRINGE FOR IV PUSH (FOR BLOOD PRESSURE SUPPORT)
PREFILLED_SYRINGE | INTRAVENOUS | Status: DC | PRN
  Administered 2019-08-23: 160 ug via INTRAVENOUS
  Administered 2019-08-23: 120 ug via INTRAVENOUS
  Administered 2019-08-23 (×3): 80 ug via INTRAVENOUS
  Administered 2019-08-23: 120 ug via INTRAVENOUS
  Administered 2019-08-23: 200 ug via INTRAVENOUS
  Administered 2019-08-23 (×2): 80 ug via INTRAVENOUS
  Administered 2019-08-23: 160 ug via INTRAVENOUS
  Administered 2019-08-23 (×2): 120 ug via INTRAVENOUS
  Administered 2019-08-23: 80 ug via INTRAVENOUS
  Administered 2019-08-23: 120 ug via INTRAVENOUS
  Administered 2019-08-23: 160 ug via INTRAVENOUS
  Administered 2019-08-23: 120 ug via INTRAVENOUS

## 2019-08-23 MED ORDER — LIDOCAINE 2% (20 MG/ML) 5 ML SYRINGE
INTRAMUSCULAR | Status: DC | PRN
  Administered 2019-08-23: 3.3 mL/h via INTRAVENOUS

## 2019-08-23 MED ORDER — ENOXAPARIN SODIUM 40 MG/0.4ML ~~LOC~~ SOLN
40.0000 mg | Freq: Once | SUBCUTANEOUS | Status: AC
  Administered 2019-08-23: 40 mg via SUBCUTANEOUS
  Filled 2019-08-23: qty 0.4

## 2019-08-23 MED ORDER — FENTANYL CITRATE (PF) 250 MCG/5ML IJ SOLN
INTRAMUSCULAR | Status: AC
  Filled 2019-08-23: qty 5

## 2019-08-23 MED ORDER — FENTANYL CITRATE (PF) 100 MCG/2ML IJ SOLN
25.0000 ug | INTRAMUSCULAR | Status: DC | PRN
  Administered 2019-08-23: 50 ug via INTRAVENOUS

## 2019-08-23 MED ORDER — ENSURE SURGERY PO LIQD
237.0000 mL | Freq: Two times a day (BID) | ORAL | Status: DC
  Filled 2019-08-23 (×4): qty 237

## 2019-08-23 MED ORDER — SUGAMMADEX SODIUM 200 MG/2ML IV SOLN: INTRAVENOUS | Status: DC | PRN

## 2019-08-23 MED ORDER — SODIUM CHLORIDE 0.9 % IV SOLN: Freq: Three times a day (TID) | INTRAVENOUS | Status: AC | PRN

## 2019-08-23 MED ORDER — METOPROLOL TARTRATE 5 MG/5ML IV SOLN: 5.0000 mg | Freq: Four times a day (QID) | INTRAVENOUS | Status: DC | PRN

## 2019-08-23 SURGICAL SUPPLY — 110 items
APL PRP STRL LF DISP 70% ISPRP (MISCELLANEOUS)
APPLIER CLIP 5 13 M/L LIGAMAX5 (MISCELLANEOUS)
APPLIER CLIP ROT 10 11.4 M/L (STAPLE)
APR CLP MED LRG 11.4X10 (STAPLE)
APR CLP MED LRG 5 ANG JAW (MISCELLANEOUS)
BLADE EXTENDED COATED 6.5IN (ELECTRODE) IMPLANT
CANNULA REDUC XI 12-8 STAPL (CANNULA) ×2
CANNULA REDUCER 12-8 DVNC XI (CANNULA) IMPLANT
CELLS DAT CNTRL 66122 CELL SVR (MISCELLANEOUS) ×1 IMPLANT
CHLORAPREP W/TINT 26 (MISCELLANEOUS) IMPLANT
CLIP APPLIE 5 13 M/L LIGAMAX5 (MISCELLANEOUS) IMPLANT
CLIP APPLIE ROT 10 11.4 M/L (STAPLE) IMPLANT
COVER SURGICAL LIGHT HANDLE (MISCELLANEOUS) ×4 IMPLANT
COVER TIP SHEARS 8 DVNC (MISCELLANEOUS) ×1 IMPLANT
COVER TIP SHEARS 8MM DA VINCI (MISCELLANEOUS) ×2
COVER WAND RF STERILE (DRAPES) ×2 IMPLANT
DEVICE TROCAR PUNCTURE CLOSURE (ENDOMECHANICALS) IMPLANT
DRAIN CHANNEL 19F RND (DRAIN) IMPLANT
DRAPE ARM DVNC X/XI (DISPOSABLE) ×4 IMPLANT
DRAPE CARDIOVASC SPLIT 84X147 (DRAPES) ×1 IMPLANT
DRAPE CARDIOVASC SPLIT 88X140 (DRAPES) ×1 IMPLANT
DRAPE COLUMN DVNC XI (DISPOSABLE) ×1 IMPLANT
DRAPE DA VINCI XI ARM (DISPOSABLE) ×8
DRAPE DA VINCI XI COLUMN (DISPOSABLE) ×2
DRSG OPSITE POSTOP 4X10 (GAUZE/BANDAGES/DRESSINGS) IMPLANT
DRSG OPSITE POSTOP 4X6 (GAUZE/BANDAGES/DRESSINGS) ×1 IMPLANT
DRSG OPSITE POSTOP 4X8 (GAUZE/BANDAGES/DRESSINGS) IMPLANT
DRSG TEGADERM 2-3/8X2-3/4 SM (GAUZE/BANDAGES/DRESSINGS) ×9 IMPLANT
DRSG TEGADERM 4X4.75 (GAUZE/BANDAGES/DRESSINGS) IMPLANT
ELECT PENCIL ROCKER SW 15FT (MISCELLANEOUS) ×1 IMPLANT
ELECT REM PT RETURN 15FT ADLT (MISCELLANEOUS) ×2 IMPLANT
ENDOLOOP SUT PDS II  0 18 (SUTURE)
ENDOLOOP SUT PDS II 0 18 (SUTURE) IMPLANT
EVACUATOR SILICONE 100CC (DRAIN) IMPLANT
GAUZE SPONGE 2X2 8PLY STRL LF (GAUZE/BANDAGES/DRESSINGS) ×1 IMPLANT
GLOVE ECLIPSE 8.0 STRL XLNG CF (GLOVE) ×6 IMPLANT
GLOVE INDICATOR 8.0 STRL GRN (GLOVE) ×6 IMPLANT
GOWN STRL REUS W/TWL XL LVL3 (GOWN DISPOSABLE) ×6 IMPLANT
GRASPER SUT TROCAR 14GX15 (MISCELLANEOUS) IMPLANT
HOLDER FOLEY CATH W/STRAP (MISCELLANEOUS) ×2 IMPLANT
IRRIG SUCT STRYKERFLOW 2 WTIP (MISCELLANEOUS) ×2
IRRIGATION SUCT STRKRFLW 2 WTP (MISCELLANEOUS) ×1 IMPLANT
KIT PROCEDURE DA VINCI SI (MISCELLANEOUS)
KIT PROCEDURE DVNC SI (MISCELLANEOUS) IMPLANT
KIT TURNOVER KIT A (KITS) IMPLANT
NDL INSUFFLATION 14GA 120MM (NEEDLE) ×1 IMPLANT
NDL SPNL 22GX3.5 QUINCKE BK (NEEDLE) IMPLANT
NEEDLE INSUFFLATION 14GA 120MM (NEEDLE) ×2 IMPLANT
NEEDLE SPNL 22GX3.5 QUINCKE BK (NEEDLE) ×2 IMPLANT
PACK COLON (CUSTOM PROCEDURE TRAY) ×2 IMPLANT
PAD POSITIONING PINK XL (MISCELLANEOUS) ×2 IMPLANT
PORT LAP GEL ALEXIS MED 5-9CM (MISCELLANEOUS) ×2 IMPLANT
PROTECTOR NERVE ULNAR (MISCELLANEOUS) ×3 IMPLANT
RELOAD STAPLE 45 BLU REG DVNC (STAPLE) IMPLANT
RELOAD STAPLE 45 GRN THCK DVNC (STAPLE) IMPLANT
RELOAD STAPLE 60 3.5 BLU DVNC (STAPLE) IMPLANT
RELOAD STAPLE 60 4.3 GRN DVNC (STAPLE) IMPLANT
RELOAD STAPLER 3.5X60 BLU DVNC (STAPLE) ×3 IMPLANT
RELOAD STAPLER 4.3X60 GRN DVNC (STAPLE) IMPLANT
RETRACTOR WND ALEXIS 18 MED (MISCELLANEOUS) IMPLANT
RTRCTR WOUND ALEXIS 18CM MED (MISCELLANEOUS) ×2
SCISSORS LAP 5X35 DISP (ENDOMECHANICALS) ×2 IMPLANT
SEAL CANN UNIV 5-8 DVNC XI (MISCELLANEOUS) ×3 IMPLANT
SEAL XI 5MM-8MM UNIVERSAL (MISCELLANEOUS) ×6
SEALER VESSEL DA VINCI XI (MISCELLANEOUS) ×2
SEALER VESSEL EXT DVNC XI (MISCELLANEOUS) ×1 IMPLANT
SOLUTION ELECTROLUBE (MISCELLANEOUS) ×2 IMPLANT
SPONGE GAUZE 2X2 STER 10/PKG (GAUZE/BANDAGES/DRESSINGS) ×1
STAPLER 45 BLU RELOAD XI (STAPLE) IMPLANT
STAPLER 45 BLUE RELOAD XI (STAPLE)
STAPLER 45 DA VINCI SURE FORM (STAPLE)
STAPLER 45 GREEN RELOAD XI (STAPLE)
STAPLER 45 GRN RELOAD XI (STAPLE) IMPLANT
STAPLER 45 SUREFORM DVNC (STAPLE) IMPLANT
STAPLER 60 DA VINCI SURE FORM (STAPLE) ×2
STAPLER 60 SUREFORM DVNC (STAPLE) IMPLANT
STAPLER CANNULA SEAL DVNC XI (STAPLE) ×1 IMPLANT
STAPLER CANNULA SEAL XI (STAPLE) ×2
STAPLER RELOAD 3.5X60 BLU DVNC (STAPLE) ×3
STAPLER RELOAD 3.5X60 BLUE (STAPLE) ×6
STAPLER RELOAD 4.3X60 GREEN (STAPLE)
STAPLER RELOAD 4.3X60 GRN DVNC (STAPLE)
STOPCOCK 4 WAY LG BORE MALE ST (IV SETS) ×4 IMPLANT
SURGILUBE 2OZ TUBE FLIPTOP (MISCELLANEOUS) IMPLANT
SUT MNCRL AB 4-0 PS2 18 (SUTURE) ×3 IMPLANT
SUT PDS AB 1 CT1 27 (SUTURE) ×4 IMPLANT
SUT PROLENE 0 CT 2 (SUTURE) IMPLANT
SUT PROLENE 2 0 KS (SUTURE) IMPLANT
SUT PROLENE 2 0 SH DA (SUTURE) IMPLANT
SUT SILK 2 0 (SUTURE)
SUT SILK 2 0 SH CR/8 (SUTURE) IMPLANT
SUT SILK 2-0 18XBRD TIE 12 (SUTURE) IMPLANT
SUT SILK 3 0 (SUTURE)
SUT SILK 3 0 SH CR/8 (SUTURE) ×1 IMPLANT
SUT SILK 3-0 18XBRD TIE 12 (SUTURE) IMPLANT
SUT V-LOC BARB 180 2/0GR6 GS22 (SUTURE)
SUT VIC AB 3-0 SH 18 (SUTURE) IMPLANT
SUT VIC AB 3-0 SH 27 (SUTURE)
SUT VIC AB 3-0 SH 27XBRD (SUTURE) IMPLANT
SUT VICRYL 0 UR6 27IN ABS (SUTURE) ×2 IMPLANT
SUTURE V-LC BRB 180 2/0GR6GS22 (SUTURE) IMPLANT
SYR 10ML ECCENTRIC (SYRINGE) ×2 IMPLANT
SYS LAPSCP GELPORT 120MM (MISCELLANEOUS)
SYSTEM LAPSCP GELPORT 120MM (MISCELLANEOUS) IMPLANT
TAPE UMBILICAL COTTON 1/8X30 (MISCELLANEOUS) ×1 IMPLANT
TOWEL OR NON WOVEN STRL DISP B (DISPOSABLE) ×2 IMPLANT
TRAY FOLEY MTR SLVR 14FR STAT (SET/KITS/TRAYS/PACK) ×1 IMPLANT
TROCAR ADV FIXATION 5X100MM (TROCAR) ×2 IMPLANT
TUBING CONNECTING 10 (TUBING) ×4 IMPLANT
TUBING INSUFFLATION 10FT LAP (TUBING) ×2 IMPLANT

## 2019-08-23 NOTE — Transfer of Care (Signed)
Immediate Anesthesia Transfer of Care Note  Patient: [name]  Procedure(s) Performed: XI ROBOT ASSISTED LAPAROSCOPIC PROXIMAL RIGHT COLECTOMY W/LYSIS OF ADHESIONS X2 HOURS, BILATERAL TAP BLOCK (N/A Abdomen)  Patient Location: PACU  Anesthesia Type:General  Level of Consciousness: sedated, patient cooperative and responds to stimulation  Airway & Oxygen Therapy: Patient Spontanous Breathing and Patient connected to face mask oxygen  Post-op Assessment: Report given to RN and Post -op Vital signs reviewed and stable  Post vital signs: Reviewed and stable  Last Vitals:  Vitals Value Taken Time  BP 128/94 08/23/19 1130  Temp    Pulse 67 08/23/19 1130  Resp 14 08/23/19 1130  SpO2 93 % 08/23/19 1130  Vitals shown include unvalidated device data.  Last Pain:  Vitals:   08/23/19 0551  TempSrc:   PainSc: 4       Patients Stated Pain Goal: 5 ([id number])  Complications: No apparent anesthesia complications

## 2019-08-23 NOTE — Anesthesia Postprocedure Evaluation (Signed)
a Anesthesia Post Note  Patient: [name]  Procedure(s) Performed: XI ROBOT ASSISTED LAPAROSCOPIC PROXIMAL RIGHT COLECTOMY W/LYSIS OF ADHESIONS X2 HOURS, BILATERAL TAP BLOCK (N/A Abdomen)     Patient location during evaluation: PACU Anesthesia Type: General Level of consciousness: awake and alert Pain management: pain level controlled Vital Signs Assessment: post-procedure vital signs reviewed and stable Respiratory status: spontaneous breathing, nonlabored ventilation, respiratory function stable and patient connected to nasal cannula oxygen Cardiovascular status: blood pressure returned to baseline and stable Postop Assessment: no apparent nausea or vomiting Anesthetic complications: no    Last Vitals:  Vitals:   08/23/19 1540 08/23/19 1600  BP:  127/68  Pulse: 85 90  Resp: 13 (!) 27  Temp:  36.7 C  SpO2: 98% 96%    Last Pain:  Vitals:   08/23/19 1600  TempSrc:   PainSc: 0-No pain                 Effie Berkshire

## 2019-08-23 NOTE — Progress Notes (Signed)
Pharmacy Brief Note - Alvimopan pre-op dose not ordered, discontinued scheduled order to start 4/17  Patient PTA on Gabapentin 300mg  tid; Gabapentin 200mg  tid ordered per ERAS pathway was discontinued  Minda Ditto PharmD 08/23/2019, 6:19 PM

## 2019-08-23 NOTE — Op Note (Signed)
08/23/2019  11:24 AM  PATIENT:  [name]  74 y.o. female  Patient Care Team: Chevis Pretty, FNP as PCP - General (Family Medicine) Nancy Marus, MD as Consulting Physician (Gynecologic Oncology) Raynelle Bring, MD as Consulting Physician (Urology) Marlaine Hind, MD as Consulting Physician (Pain Medicine) Loletha Carrow Kirke Corin, MD as Consulting Physician (Gastroenterology) Michael Boston, MD as Consulting Physician (General Surgery)  PRE-OPERATIVE DIAGNOSIS:  Colon polyp unresectable by colonoscopy  POST-OPERATIVE DIAGNOSIS:  Ascending colon polyp unresectable by colonoscopy  PROCEDURE:  XI ROBOTIC PROXIMAL RIGHT COLECTOMY ROBOTIC LYSIS OF ADHESIONS X2 HOURS OMENTOPEXY BILATERAL TAP BLOCK  SURGEON:  Adin Hector, MD  ASSISTANTS:  Leighton Ruff, MD, FACS. Fran Lowes, PA-S, [employer] An experienced assistant was required given the standard of surgical care given the complexity of the case.  This assistant was needed for exposure, dissection, suctioning, retraction, instrument exchange, etc.   ANESTHESIA:     General  Nerve block provided with liposomal bupivacaine (Experel) mixed with 0.25% bupivacaine as a Bilateral TAP block x 78mL each side at the level of the transverse abdominis & preperitoneal spaces along the flank at the anterior axillary line, from subcostal ridge to iliac crest under laparoscopic guidance   Local field block at port sites & extraction wound  EBL:  Total I/O In: 3600 [I.V.:3000; IV Piggyback:600] Out: 250 [Urine:100; Blood:150]  Delay start of Pharmacological VTE agent (>24hrs) due to surgical blood loss or risk of bleeding:  no  DRAINS: none   SPECIMEN: Proximal right colon.  Tattooing at ascending colon near the obvious polyp  DISPOSITION OF SPECIMEN:  PATHOLOGY  COUNTS:  YES  PLAN OF CARE: Admit to inpatient   PATIENT DISPOSITION:  PACU - mild hypotension  INDICATION:    Pleasant elderly woman with numerous  prior abdominal surgeries and moderate fusions.  Found to have a large polyp in the proximal colon not amenable to endoscopic resection.  Surgical consultation requested.  I recommended segmental resection:  The anatomy & physiology of the digestive tract was discussed.  The pathophysiology was discussed.  Natural history risks without surgery was discussed.   I worked to give an overview of the disease and the frequent need to have multispecialty involvement.  I feel the risks of no intervention will lead to serious problems that outweigh the operative risks; therefore, I recommended a partial colectomy to remove the pathology.  Laparoscopic & open techniques were discussed.   Risks such as bleeding, infection, abscess, leak, reoperation, possible ostomy, hernia, heart attack, death, and other risks were discussed.  I noted a good likelihood this will help address the problem.   Goals of post-operative recovery were discussed as well.  We will work to minimize complications.  An educational handout on the pathology was given as well.  Questions were answered.    The patient expresses understanding & wishes to proceed with surgery.  OR FINDINGS:   CASE DATA:  Type of patient?: Elective [facility] Private Case  Status of Case? Elective Scheduled  Infection Present At Time Of Surgery (PATOS)?  NO  Patient had very dense adhesions to the anterior abdominal wall primarily of omentum to her very large abdominal mesh.  Redundant colon.  Obvious tattooing at the distal ascending colon near the hepatic flexure.  No obvious metastatic disease on visceral parietal peritoneum or liver.  It is an isoperistaltic ileocolonic anastomosis that rests in the periumbilical region.  DESCRIPTION:   Informed consent was confirmed.  The patient underwent general anaesthesia without  difficulty.  The patient was positioned with arms tucked & secured appropriately.  VTE prevention in place.  The patient's abdomen was  clipped, prepped, & draped in a sterile fashion.  Surgical timeout confirmed our plan.  The patient was positioned in reverse Trendelenburg.  Abdominal entry was gained using Varess technique at the left subcostal ridge on the anterior abdominal wall.  No elevated EtCO2 noted.  Port placed.  Camera inspection revealed no injury.  Patient had very dense adhesions.  I was able to carefully sweep the very thin omental ones off and place extra ports under direct laparoscopic visualization.  We docked the [employer] Vinci robot carefully and placed intstruments under visualization.  Proceeded to do lysis of adhesions using focused sharp dissection to free the omentum stomach and liver off the anterior abdominal wall.  Was able to identify the tattooing along the ascending colon.  This was consistent as suspected by gastroenterology.  There is some central sparing on the prior large mesh, but the edges had rather dense adhesions.  Eventually freed off.  I mobilized & reflected the greater omentum and small bowel in the upper abdomen.    I was able to elevate the proximal colon to isolate the ileocolonic pedicle.  I scored the ileal mesentery just proximal to that.   I carried that further dissection in a medial to lateral fashion.  I was able to bluntly get into the retro-mesenteric plane on the right side.  I freed the proximal right sided colonic mesentery off the retroperitoneum including the duodenal sweep, pancreatic head, & Gerota's fascia of the right kidney. I was able to get underneath the hepatic flexure.  I was able to get underneath the proximal and mid transverse colon.  I isolated the proximal ileocecal pedicle.  I skeletonized it & transected the vessels.    I then proceeded to mobilize the terminal ileum & proximal "right" colon in a lateral to medial fashion.  I mobilized the distal ileal mesentery off its retroperitoneal and pelvic attachments.  I mobilized the ascending colon off It is side wall  attachments to the paracolic gutter and retroperitoneum.  And freed adhesions of the gallbladder to the retroperitoneum and mesocolon and omentum.  Freed adhesions to the liver as well.  I also mobilized the greater omentum off the mid transverse colon and mobilized the mid to proximal transverse colon in a superior to inferior fashion.  This allowed me to mobilize the hepatic flexure and get a complete mobilization of the proximal "right" colon to the mid-transverse colon.  We took care to transect to the mid transverse colon radially just proximal to a dominant middle colic pedicle.  Alternated from inferior and superior approaches to make sure there is no injury to the duodenum, pancreatic head, duodenal sweep, stomach, gallbladder.  We went ahead and proceeded with transection.  I transected the distal ileal mesentery and then transected at the distal ileum with a robotic stapler.  I then transected transverse colon mesentery just proximal to a dominant middle colic arterial pedicle radially.  Transected at the proximal transverse colon with a robotic stapler.  We assured hemostasis.   I did a side-to-side stapled anastomosis of ileum to mid-transverse colon using a 63mm robotic stapler x 1 firing in an isoperistaltic fashion.  (Distal stump of ileum to mid transverse colon for the distal end of the anastomosis.  Proximal end of colon stump to more proximal ileum for the proximal end of the anastomosis).  I sewed the common  staple channel wound with an absorbable suture ( 2-0 V-lock) in a running Eveleth fashion from each corner and meeting in the center.  I did meticulous inspection prove an airtight closure.  I protected the anastomosis line with an anterior omentopexy of greater omentum using V lock suture.  We did reinspection of the abdomen.  Hemostasis was good.   Ureters, retroperitoneum, and bowel uninjured.  The anastomosis looked healthy.   Did irrigation of several liters.  Well the omentum had  been oozy, things cleared up well.  Hemostasis was good.  Robot was undocked.  I placed a wound protector through the 80mm port site after it was enlarged in a Pfannenstiel fashion.  Specimen removed without incident.   Ports & wound protector removed.  We changed gloves & redraped the patient per colon SSI prevention protocol.  We aspirated the antibiotic irrigation.  Hemostasis was good.  Sterile unused instruments were used from this point.  I closed the skin at the port sites using Monocryl stitch and sterile dressing.  I closed the extraction wound using a 0 Vicryl vertical peritoneal closure and a #1 PDS transverse anterior rectal fascial closure like a small Pfannenstiel closure. I closed the skin with some interrupted Monocryl stitches. I placed antibiotic-soaked wicks into the closure at the corners x2.  I placed sterile dressings.     Patient is being extubated go to recovery room.  While blood loss was rather low, blood pressure was somewhat soft through the case.  Therefore, we watched the patient in the stepdown unit overnight just in case.  Anesthesia agreed.  I discussed postop care with the patient in detail the office & in the holding area. Instructions are written. I discussed operative findings, updated the patient's status, discussed probable steps to recovery, and gave postoperative recommendations to the patient's daughter-in-law, Rip Harbour.  Recommendations were made.  Questions were answered.  She expressed understanding & appreciation.   Adin Hector, M.D., F.A.C.S. Gastrointestinal and Minimally Invasive Surgery [facility] [address], [place], [place] [id number] [phone] Main / Paging

## 2019-08-23 NOTE — H&P (Signed)
[name]  DOB: 07/21/1945   `  `  Patient sent for surgical consultation at the request of Dr Wilfrid Lund  Chief Complaint: Large polyp of the ascending colon. Request for surgical removal  `   Patient Care Team: Chevis Pretty, FNP as PCP - General (Family Medicine) Nancy Marus, MD as Consulting Physician (Gynecologic Oncology) Raynelle Bring, MD as Consulting Physician (Urology) Marlaine Hind, MD as Consulting Physician (Pain Medicine) Loletha Carrow Kirke Corin, MD as Consulting Physician (Gastroenterology) Michael Boston, MD as Consulting Physician (General Surgery) \ `  The patient is a patient has been followed by [facility] gastroenterology for colon polyps. Had a 10 yr f/ucolonoscopy in August by Dr Loletha Carrow that noted a large polyp carpeting the ascending colon. At least 6 cm. Not able to be resected endoscopically given the difficult anatomy. Biopsy showed adenoma. Surgical consultation recommended. Patient had not set appointment in September and again GI recommended setting up appointment. Patient has chronic low back pain and is on chronic narcotics. Follow-up with regular back injections. I had done urgent surgery on her in 2014 for an incarcerated hernia requiring small bowel resection. Patient developed recurrence, so I did a laparoscopic underlay repair mesh with a giant 33 cm sheet later in that year. Have not seen her since then. She had a history of an open left nephrectomy many years ago and has some chronic bulging/asymmetry on that side. She had no obvious hernias along that fold incision. No other surgery since then.  She comes today with her daughter. She wish to help take care of her since I didn't prior surgery with her. She moves her bowels about 2-3 times a week. Uses a laxative to help out. She can walk about 10-20 minutes with a cane. Can run errands. Denies any rectal bleeding. No personal nor family history of GI/colon cancer, inflammatory bowel disease,  irritable bowel syndrome, allergy such as Celiac Sprue, dietary/dairy problems, colitis, ulcers nor gastritis. No recent sick contacts/gastroenteritis. No travel outside the country. No changes in diet. No dysphagia to solids or liquids. No significant heartburn or reflux. No hematochezia, hematemesis, coffee ground emesis. No evidence of prior gastric/peptic ulceration.  (Review of systems as stated in this history (HPI) or in the review of systems. Otherwise all other 12 point ROS are negative)  `  `  `  Diagnosis  Surgical [P], colon, ascending, polyp  - TUBULOVILLOUS ADENOMA (MULTIPLE FRAGMENTS).  - NO HIGH GRADE DYSPLASIA OR MALIGNANCY.  [name] MD  Pathologist, Electronic Signature  (Case signed 12/27/2018)  Specimen [name] and Clinical Information  Specimen Comment  Family history of malignant neoplasm of gastrointestinal tract  Specimen(s) Obtained:  Surgical [P], colon, ascending, polyp  Specimen Clinical Information  R/O adenoma; benign neoplasm of ascending colon  [name]  Received in formalin are tan, soft tissue fragments that are submitted in toto. Number: multiple, Size: 0.2 cm smallest  to 0.4 cm largest, (1 B) ( TA )  Past Surgical History [name], CMA; 04/08/2019 10:19 AM)  Cataract Surgery Bilateral.  Colon Polyp Removal - Colonoscopy  Foot Surgery Bilateral.  Hip Surgery Bilateral.  Hysterectomy (due to cancer) - Complete  Knee Surgery Right.  Resection of Small Bowel  Spinal Surgery - Lower Back  Spinal Surgery Midback  Diagnostic Studies History [name], CMA; 04/08/2019 10:19 AM)  Colonoscopy within last year  Mammogram within last year  Pap Smear >5 years ago  Allergies [name], CMA; 04/08/2019 10:21 AM)  Sulfa Antibiotics  Zofran *  ANTIEMETICS*  Allergies Reconciled  Medication History [name], CMA; 04/08/2019 10:21 AM)  Amitriptyline HCl (25MG  Tablet, Oral) Active.  Lisinopril-hydroCHLOROthiazide (10-12.5MG  Tablet,  Oral) Active.  oxyCODONE-Acetaminophen (7.5-325MG  Tablet, Oral) Active.  Medications Reconciled  Social History [name], [place]; 04/08/2019 10:19 AM)  Caffeine use Coffee.  No alcohol use  No drug use  Tobacco use Never smoker.  Family History [name], [place]; 04/08/2019 10:19 AM)  Alcohol Abuse Father, Mother.  Arthritis Father.  Colon Cancer Sister.  Diabetes Mellitus Father, Mother, Sister, Son.  Seizure disorder Daughter.  Pregnancy / Birth History [name], [place]; 04/08/2019 10:19 AM)  Age at menarche 62 years.  Age of menopause 11-55  Gravida 3  Maternal age 19-20  Para 3  Regular periods  Other Problems [name], CMA; 04/08/2019 10:19 AM)  Arthritis  Back Pain  Bladder Problems  High blood pressure  Oophorectomy Bilateral.  Ovarian Cancer  Review of Systems [name] CMA; 04/08/2019 10:19 AM)  General Not Present- Appetite Loss, Chills, Fatigue, Fever, Night Sweats, Weight Gain and Weight Loss.  Skin Not Present- Change in Wart/Mole, Dryness, Hives, Jaundice, New Lesions, Non-Healing Wounds, Rash and Ulcer.  HEENT Present- Hearing Loss, Seasonal Allergies and Wears glasses/contact lenses. Not Present- Earache, Hoarseness, Nose Bleed, Oral Ulcers, Ringing in the Ears, Sinus Pain, Sore Throat, Visual Disturbances and Yellow Eyes.  Respiratory Not Present- Bloody sputum, Chronic Cough, Difficulty Breathing, Snoring and Wheezing.  Breast Not Present- Breast Mass, Breast Pain, Nipple Discharge and Skin Changes.  Cardiovascular Not Present- Chest Pain, Difficulty Breathing Lying Down, Leg Cramps, Palpitations, Rapid Heart Rate, Shortness of Breath and Swelling of Extremities.  Gastrointestinal Present- Bloating. Not Present- Abdominal Pain, Bloody Stool, Change in Bowel Habits, Chronic diarrhea, Constipation, Difficulty Swallowing, Excessive gas, Gets full quickly at meals, Hemorrhoids, Indigestion, Nausea, Rectal Pain and Vomiting.  Female  Genitourinary Not Present- Frequency, Nocturia, Painful Urination, Pelvic Pain and Urgency.  Musculoskeletal Present- Back Pain, Joint Pain and Joint Stiffness. Not Present- Muscle Pain, Muscle Weakness and Swelling of Extremities.  Neurological Not Present- Decreased Memory, Fainting, Headaches, Numbness, Seizures, Tingling, Tremor, Trouble walking and Weakness.  Psychiatric Not Present- Anxiety, Bipolar, Change in Sleep Pattern, Depression, Fearful and Frequent crying.  Endocrine Not Present- Cold Intolerance, Excessive Hunger, Hair Changes, Heat Intolerance, Hot flashes and New Diabetes.  Hematology Not Present- Blood Thinners, Easy Bruising, Excessive bleeding, Gland problems, HIV and Persistent Infections.  Vitals [name] CMA; 04/08/2019 10:20 AM)  04/08/2019 10:20 AM  Weight: 193.8 lb Height: 61 in  Body Surface Area: 1.86 m Body Mass Index: 36.62 kg/m  Temp.: 98 F Pulse: 95 (Regular)  BP: 142/88 (Sitting, Left Arm, Standard)  Physical Exam Adin Hector MD; 04/08/2019 10:23 AM)  General  Mental Status - Alert.  General Appearance - Not in acute distress, Not Sickly.  Orientation - Oriented X3.  Hydration - Well hydrated.  Voice - Normal.  Integumentary  Global Assessment  Upon inspection and palpation of skin surfaces of the - Axillae: non-tender, no inflammation or ulceration, no drainage. and Distribution of scalp and body hair is normal.  General Characteristics  Temperature - normal warmth is noted.  Head and Neck  Head - normocephalic, atraumatic with no lesions or palpable masses.  Face  Global Assessment - atraumatic, no absence of expression.  Neck  Global Assessment - no abnormal movements, no bruit auscultated on the right, no bruit auscultated on the left, no decreased range of motion, non-tender.  Trachea - midline.  Thyroid  Gland  Characteristics - non-tender.  Eye  Eyeball - Left - Extraocular movements intact, No Nystagmus - Left.  Eyeball -  Right - Extraocular movements intact, No Nystagmus - Right.  Cornea - Left - No Hazy - Left.  Cornea - Right - No Hazy - Right.  Sclera/Conjunctiva - Left - No scleral icterus, No Discharge - Left.  Sclera/Conjunctiva - Right - No scleral icterus, No Discharge - Right.  Pupil - Left - Direct reaction to light normal.  Pupil - Right - Direct reaction to light normal.  ENMT  Ears  Pinna - Left - no drainage observed, no generalized tenderness observed. Pinna - Right - no drainage observed, no generalized tenderness observed.  Nose and Sinuses  External Inspection of the Nose - no destructive lesion observed. Inspection of the nares - Left - quiet respiration. Inspection of the nares - Right - quiet respiration.  Mouth and Throat  Lips - Upper Lip - no fissures observed, no pallor noted. Lower Lip - no fissures observed, no pallor noted. Nasopharynx - no discharge present. Oral Cavity/Oropharynx - Tongue - no dryness observed. Oral Mucosa - no cyanosis observed. Hypopharynx - no evidence of airway distress observed.  Chest and Lung Exam  Inspection  Movements - Normal and Symmetrical. Accessory muscles - No use of accessory muscles in breathing.  Palpation  Palpation of the chest reveals - Non-tender.  Auscultation  Breath sounds - Normal and Clear.  Cardiovascular  Auscultation  Rhythm - Regular. Murmurs & Other Heart Sounds - Auscultation of the heart reveals - No Murmurs and No Systolic Clicks.  Abdomen  Inspection  Inspection of the abdomen reveals - No Visible peristalsis and No Abnormal pulsations. Umbilicus - No Bleeding, No Urine drainage.  Palpation/Percussion  Palpation and Percussion of the abdomen reveal - Soft, Non Tender, No Rebound tenderness, No Rigidity (guarding) and No Cutaneous hyperesthesia.  Note: Abdomen soft. Not severely distended. No distasis recti. No umbilical or other anterior abdominal wall hernias  Female Genitourinary  Sexual Maturity  Tanner 5 - Adult  hair pattern.  Note: No vaginal bleeding nor discharge  Peripheral Vascular  Upper Extremity  Inspection - Left - No Cyanotic nailbeds - Left, Not Ischemic. Inspection - Right - No Cyanotic nailbeds - Right, Not Ischemic.  Neurologic  Neurologic evaluation reveals - normal attention span and ability to concentrate, able to name objects and repeat phrases. Appropriate fund of knowledge , normal sensation and normal coordination.  Mental Status  Affect - not angry, not paranoid.  Cranial Nerves - Normal Bilaterally.  Gait - Normal.  Neuropsychiatric  Mental status exam performed with findings of - able to articulate well with normal speech/language, rate, volume and coherence, thought content normal with ability to perform basic computations and apply abstract reasoning and no evidence of hallucinations, delusions, obsessions or homicidal/suicidal ideation.  Musculoskeletal  Global Assessment  Spine, Ribs and Pelvis - no instability, subluxation or laxity. Right Upper Extremity - no instability, subluxation or laxity.  Lymphatic  Head & Neck  General Head & Neck Lymphatics: Bilateral - Description - No Localized lymphadenopathy.  Axillary  General Axillary Region: Bilateral - Description - No Localized lymphadenopathy.  Femoral & Inguinal  Generalized Femoral & Inguinal Lymphatics: Left - Description - No Localized lymphadenopathy. Right - Description - No Localized lymphadenopathy.  Assessment & Plan Adin Hector MD; 04/08/2019 10:40 AM)  PREOP COLON - ENCOUNTER FOR PREOPERATIVE EXAMINATION FOR GENERAL SURGICAL PROCEDURE (Z01.818)  Current Plans  You are being scheduled for surgery -  Our schedulers will call you.  You should hear from our office's scheduling department within 5 working days about the location, date, and time of surgery. We try to make accommodations for patient's preferences in scheduling surgery, but sometimes the OR schedule or the surgeon's schedule prevents [place] from  making those accommodations.  If you have not heard from our office [phone]) in 5 working days, call the office and ask for your surgeon's nurse.  If you have other questions about your diagnosis, plan, or surgery, call the office and ask for your surgeon's nurse.  Written instructions provided  The anatomy & physiology of the digestive tract was discussed. The pathophysiology of the colon was discussed. Natural history risks without surgery was discussed. I feel the risks of no intervention will lead to serious problems that outweigh the operative risks; therefore, I recommended a partial colectomy to remove the pathology. Minimally invasive (Robotic/Laparoscopic) & open techniques were discussed.  Risks such as bleeding, infection, abscess, leak, reoperation, possible ostomy, hernia, heart attack, death, and other risks were discussed. I noted a good likelihood this will help address the problem. Goals of post-operative recovery were discussed as well. Need for adequate nutrition, daily bowel regimen and healthy physical activity, to optimize recovery was noted as well. We will work to minimize complications. Educational materials were available as well. Questions were answered. The patient expresses understanding & wishes to proceed with surgery.  Pt Education - [facility] Colon Bowel Prep 2018 ERAS/Miralax/Antibiotics  Started Neomycin Sulfate 500 MG Oral Tablet, 2 (two) Tablet SEE NOTE, #6, 04/08/2019, No Refill.  Local Order:  Pharmacist Notes: TAKE TWO TABLETS AT 2 PM, 3 PM, AND 10 PM THE DAY PRIOR TO SURGERY  Started Flagyl 500 MG Oral Tablet, 2 (two) Tablet SEE NOTE, #6, 04/08/2019, No Refill.  Local Order:  Pharmacist Notes: Take at 2pm, 3pm, and 10pm the day prior to your colon operation  Pt Education - Pamphlet Given - Laparoscopic Colorectal Surgery: discussed with patient and provided information.  Pt Education - [facility] Colectomy post-op instructions: discussed with patient and provided  information.   ADENOMATOUS POLYP OF ASCENDING COLON (D12.2)  Impression: Large polyp of proximal ascending colon not amenable to endoscopic resection. Surgical consultation recommended earlier this year.  Large polyp of proximal ascending colon not amenable to endoscopic resection. Surgical consultation recommended earlier this year. I think she would benefit from segmental colonic resection. Reasonable to try a minimally invasive approach. Resection with intracorporeal anastomosis and Pfannenstiel excision. With her giant sheet of mesh, expected additional hour of lysis of adhesions, perhaps longer. We will see. We can do intracorporeal anastomosis and extracted through Pfannenstiel incision, avoiding her upper abdominal mesh.

## 2019-08-23 NOTE — Progress Notes (Signed)
PACU Nursing  Upon admission to PACU, plan for patient to be admitted to stepdown for issues with blood pressure intra op. Patient arrived to PACU at 11:30am. Since arrival to PACU, patient's SBP remained above 140mmHg. After 4.5 hours in PACU waiting for SD bed, RN contacted Smith Robert (anesthesia) and White (on call for Gross) regarding whether patient could be changed to Med Surg. White came to bedside to assess patient and gave verbal order to admit to Med Surg with continuous pulse ox.  Callie Fielding RN

## 2019-08-23 NOTE — Anesthesia Preprocedure Evaluation (Addendum)
Anesthesia Evaluation  Patient identified by MRN, date of birth, ID band Patient awake    Reviewed: Allergy & Precautions, NPO status , Patient's Chart, lab work & pertinent test results  Airway Mallampati: I  TM Distance: >3 FB Neck ROM: Full    Dental  (+) Edentulous Upper, Edentulous Lower   Pulmonary    breath sounds clear to auscultation       Cardiovascular hypertension, Pt. on medications  Rhythm:Regular Rate:Normal     Neuro/Psych PSYCHIATRIC DISORDERS Depression  Neuromuscular disease    GI/Hepatic negative GI ROS, Neg liver ROS,   Endo/Other  negative endocrine ROS  Renal/GU Renal disease     Musculoskeletal  (+) Arthritis ,   Abdominal (+) + obese,   Peds  Hematology negative hematology ROS (+)   Anesthesia Other Findings   Reproductive/Obstetrics                            Lab Results  Component Value Date   WBC 9.5 08/15/2019   HGB 13.5 08/15/2019   HCT 42.2 08/15/2019   MCV 99.5 08/15/2019   PLT 296 08/15/2019   Lab Results  Component Value Date   INR 1.15 02/08/2016   INR 1.05 06/23/2014   INR 1.92 (H) 12/15/2009     Anesthesia Physical Anesthesia Plan  ASA: II  Anesthesia Plan: General   Post-op Pain Management:    Induction: Intravenous  PONV Risk Score and Plan: 4 or greater and Ondansetron, Dexamethasone and Treatment may vary due to age or medical condition  Airway Management Planned: Oral ETT  Additional Equipment: None  Intra-op Plan:   Post-operative Plan: Extubation in OR  Informed Consent: I have reviewed the patients History and Physical, chart, labs and discussed the procedure including the risks, benefits and alternatives for the proposed anesthesia with the patient or authorized representative who has indicated his/her understanding and acceptance.       Plan Discussed with: CRNA  Anesthesia Plan Comments:        Anesthesia  Quick Evaluation

## 2019-08-23 NOTE — Interval H&P Note (Signed)
History and Physical Interval Note:  08/23/2019 7:10 AM  [name]  has presented today for surgery, with the diagnosis of Colon polyp unresectable by colonoscopy.  The various methods of treatment have been discussed with the patient and family. After consideration of risks, benefits and other options for treatment, the patient has consented to  Procedure(s): [facility] [place] (N/A) as a surgical intervention.  The patient's history has been reviewed, patient examined, no change in status, stable for surgery.  I have reviewed the patient's chart and labs.  Questions were answered to the patient's satisfaction.    I have re-reviewed the the patient's records, history, medications, and allergies.  I have re-examined the patient.  I again discussed intraoperative plans and goals of post-operative recovery.  The patient agrees to proceed.  [name]  05/03/1946 [id number]  Patient Care Team: Chevis Pretty, FNP as PCP - General (Family Medicine) Nancy Marus, MD as Consulting Physician (Gynecologic Oncology) Raynelle Bring, MD as Consulting Physician (Urology) Marlaine Hind, MD as Consulting Physician (Pain Medicine) Doran Stabler, MD as Consulting Physician (Gastroenterology) Michael Boston, MD as Consulting Physician (General Surgery)  Patient Active Problem List   Diagnosis Date Noted   Hydronephrosis    Pyelonephritis 08/01/2017   Class 2 severe obesity with serious comorbidity and body mass index (BMI) of 35.0 to 35.9 in adult [facility]) 06/21/2017   History of hydronephrosis    Low back pain    Abdominal pain 04/26/2016   Left flank pain    Intractable abdominal pain 04/25/2016   Recurrent UTI 02/20/2016   Peripheral neuropathy 02/08/2016   Depression 02/08/2016   OA (osteoarthritis) of hip 07/04/2014   Retroperitoneal fibrosis in setting of chronic abscess at ureter 2010 02/21/2013   Pseudoarthrosis L3-  L5 12/17/2012   Nocturnal leg cramps 11/13/2012   HLD (hyperlipidemia) 08/10/2012   Recurrent ventral incisional hernia s/p lap repair 04/23/2013 04/27/2012   Obesity (BMI 30-39.9) 04/27/2012   Chronic midline thoracic back pain 04/27/2012   Hypertension    History of endometrial cancer s/p TAH BSO June2013 06/15/2011    Past Medical History:  Diagnosis Date   Arthritis    Chronic back pain    Chronic constipation    Chronic constipation    Chronic midline low back pain with sciatica    Colon polyp    large ascending colon polyp , scheduled for resection 02/ 2021   Full dentures    History of endometrial cancer 2012   s/p   TAH w/ BSO 11-02-2010   History of recurrent UTIs    History of sepsis    multiple times (some due to e.coli) last sepsis 07/ 2019   History of small bowel obstruction    x2  ,  hx bowel resection   HOH (hard of hearing)    Hyperlipidemia    Hypertension    Retroperitoneal fibrosis    Ureteral obstruction, left urologist-- dr Alinda Money   secondary to adhesions/ retroperitoneal fibrosis, treated with ureteral stent   Vertigo    Wears glasses     Past Surgical History:  Procedure Laterality Date   ABDOMINAL HYSTERECTOMY  11/02/10  dr Alycia Rossetti  @[facility]    TAH, BSO, incisional hernia repair , lysis of adhesions for endometrial cancer   ANTERIOR LUMBAR FUSION  01-28-2008   dr elsner  @[facility]    L2 -- L4   BACK SURGERY     BUNIONECTOMY Right 2003   CATARACT EXTRACTION W/  INTRAOCULAR LENS  IMPLANT, BILATERAL  2007   COLONOSCOPY  last one 12-24-2018   CYSTO/  LEFT RETROGRADE PYELOGRAM/ URETEROSCOPY STENT PLACEMENT/  OPEN URETEROLYSIS WITH OMENTAL FLAP Left 03-11-2009    dr borden  @[facility]    CYSTO/  [facility]/  ANTERIOR REPAIR  12-06-2001    dr Jeffie Pollock @[facility]    CYSTOSCOPY W/ URETERAL STENT PLACEMENT Left 12/07/2017   Procedure: CYSTOSCOPY WITH RETROGRADE PYELOGRAM/URETERAL STENT PLACEMENT;  Surgeon: Raynelle Bring, MD;  Location: [facility];  Service: Urology;  Laterality: Left;    CYSTOSCOPY W/ URETERAL STENT PLACEMENT Left 03/29/2018   Procedure: CYSTOSCOPY WITH STENT  EXCHANGE;  Surgeon: Raynelle Bring, MD;  Location: [facility];  Service: Urology;  Laterality: Left;   CYSTOSCOPY WITH RETROGRADE PYELOGRAM, URETEROSCOPY AND STENT PLACEMENT Left 12-29-2008   dr Alinda Money  @[facility]    WITH BALLOON DILATION LEFT URETERAL STRICTURE   CYSTOSCOPY WITH RETROGRADE PYELOGRAM, URETEROSCOPY AND STENT PLACEMENT Left 09-12-2008    dr Jeffie Pollock @[facility]    CYSTOSCOPY WITH STENT PLACEMENT Left 07/26/2018   Procedure: CYSTOSCOPY WITH STENT CHANGE;  Surgeon: Raynelle Bring, MD;  Location: [facility];  Service: Urology;  Laterality: Left;   CYSTOSCOPY WITH STENT PLACEMENT Left 12/03/2018   Procedure: CYSTOSCOPY WITH STENT CHANGE;  Surgeon: Raynelle Bring, MD;  Location: [facility];  Service: Urology;  Laterality: Left;   CYSTOSCOPY WITH STENT PLACEMENT Left 04/22/2019   Procedure: CYSTOSCOPY WITH STENT CHANGE;  Surgeon: Raynelle Bring, MD;  Location: [facility];  Service: Urology;  Laterality: Left;   FOOT SURGERY Left 06-23-2010   dr Beola Cord   left first and second toes   INSERTION OF MESH N/A 04/23/2013   Procedure: INSERTION OF MESH;  Surgeon: Adin Hector, MD;  Location: [facility];  Service: General;  Laterality: N/A;   JOINT REPLACEMENT     KNEE ARTHROSCOPY Right 04-27-2007  dr Shellia Carwin @[facility]    LAPAROSCOPIC LYSIS OF ADHESIONS N/A 04/23/2013   Procedure: LAPAROSCOPIC LYSIS OF ADHESIONS;  Surgeon: Adin Hector, MD;  Location: [facility];  Service: General;  Laterality: N/A;   [place]  12/17/2012   L3 -- L5 laminectomy and L3 -- 5 fusion   TOTAL HIP ARTHROPLASTY Left 07/04/2014   Procedure: LEFT TOTAL HIP ARTHROPLASTY ANTERIOR APPROACH;  Surgeon: Gearlean Alf, MD;  Location: [place];  Service: Orthopedics;  Laterality: Left;   TOTAL HIP ARTHROPLASTY Right 2009   TOTAL KNEE ARTHROPLASTY Right 09-15-2009   dr Shellia Carwin @[facility]    VENTRAL HERNIA  REPAIR N/A 07/19/2012   Procedure: LAPAROSCOPIC LYSIS OF ADHESIONS, SMALL BOWEL RESECTION, SEROSAL REPAIR, PRIMARY VENTRAL HERNIA REPAIR;  Surgeon: Adin Hector, MD;  Location: [facility];  Service: General;  Laterality: N/A;   VENTRAL HERNIA REPAIR N/A 04/23/2013   Procedure: LAPAROSCOPIC exploration and repair of hernia in abdominal VENTRAL wall  HERNIA;  Surgeon: Adin Hector, MD;  Location: [facility];  Service: General;  Laterality: N/A;    Social History   Socioeconomic History   Marital status: Widowed    Spouse name: Not on file   Number of children: 3   Years of education: 12   Highest education level: High school graduate  Occupational History   Occupation: Retired  Tobacco Use   Smoking status: Never Smoker   Smokeless tobacco: Never Used  Substance and Sexual Activity   Alcohol use: No   Drug use: No   Sexual activity: Not Currently    Birth control/protection: Surgical  Other  Topics Concern   Not on file  Social History Narrative   Not on file   Social Determinants of Health   Financial Resource Strain:    Difficulty of Paying Living Expenses:   Food Insecurity:    Worried About Charity fundraiser in the Last Year:    Arboriculturist in the Last Year:   Transportation Needs:    Film/video editor (Medical):    Lack of Transportation (Non-Medical):   Physical Activity:    Days of Exercise per Week:    Minutes of Exercise per Session:   Stress:    Feeling of Stress :   Social Connections:    Frequency of Communication with Friends and Family:    Frequency of Social Gatherings with Friends and Family:    Attends Religious Services:    Active Member of Clubs or Organizations:    Attends Music therapist:    Marital Status:   Intimate Partner Violence:    Fear of Current or Ex-Partner:    Emotionally Abused:    Physically Abused:    Sexually Abused:     Family History  Problem Relation Age of Onset   Diabetes Mother    Lung cancer  Mother    Diabetes Father    Liver cancer Father    Colon cancer Sister    Diabetes Sister    Seizures Daughter    Diabetes Son    Diabetes Sister    Hypertension Sister    Kidney disease Sister    Breast cancer Neg Hx    Allergic rhinitis Neg Hx    Angioedema Neg Hx    Asthma Neg Hx    Atopy Neg Hx    Eczema Neg Hx    Immunodeficiency Neg Hx    Urticaria Neg Hx    Colon polyps Neg Hx    Esophageal cancer Neg Hx    Rectal cancer Neg Hx    Stomach cancer Neg Hx     Medications Prior to Admission  Medication Sig Dispense Refill Last Dose   amitriptyline (ELAVIL) 25 MG tablet Take 1 tablet (25 mg total) by mouth at bedtime. 90 tablet 1 08/21/2019   gabapentin (NEURONTIN) 300 MG capsule Take 1 capsule (300 mg total) by mouth 3 (three) times daily. 90 capsule 3 08/22/2019 at Unknown time   lisinopril-hydrochlorothiazide (ZESTORETIC) 10-12.5 MG tablet Take 1 tablet by mouth daily. 90 tablet 0 08/22/2019 at Unknown time   meloxicam (MOBIC) 7.5 MG tablet Take 1 tablet (7.5 mg total) by mouth daily. 90 tablet 1 1+ week ago   methocarbamol (ROBAXIN) 500 MG tablet Take 500 mg by mouth at bedtime.    08/22/2019 at Unknown time   oxyCODONE-acetaminophen (PERCOCET) 7.5-325 MG tablet Take 1 tablet by mouth every 8 (eight) hours as needed for severe pain. 90 tablet 0 08/22/2019 at Unknown time   oxyCODONE-acetaminophen (PERCOCET) 7.5-325 MG tablet Take 1 tablet by mouth every 8 (eight) hours as needed for severe pain. 90 tablet 0 08/22/2019 at Unknown time   Sennosides (EX-LAX PO) Take 1 tablet by mouth 2 (two) times a week.       oxyCODONE-acetaminophen (PERCOCET) 7.5-325 MG tablet Take 1 tablet by mouth every 8 (eight) hours as needed for severe pain. (Patient not taking: Reported on 08/09/2019) 90 tablet 0 Not Taking at Unknown time    Current Facility-Administered Medications  Medication Dose Route Frequency Provider Last Rate Last Admin   bupivacaine liposome (EXPAREL) 1.3 % injection  266 mg  20  mL Infiltration On Call to OR Michael Boston, MD       cefoTEtan (CEFOTAN) 2 g in sodium chloride 0.9 % 100 mL IVPB  2 g Intravenous On Call to OR Michael Boston, MD       lactated ringers infusion   Intravenous Continuous Effie Berkshire, MD 50 mL/hr at 08/23/19 0600 New Bag at 08/23/19 0600     Allergies  Allergen Reactions   Cashew Nut Oil Hives   Dog Epithelium Hives and Itching   Lyrica [Pregabalin]     Weight gain   Ondansetron Hcl Nausea And Vomiting    "made me throw up"   Sulfa Antibiotics     "blistering lips and mouth sores"    BP (!) 159/70   Pulse 86   Temp 98.7 F (37.1 C) (Oral)   Resp 17   Ht 5' (1.524 m)   Wt 90.3 kg   SpO2 98%   BMI 38.88 kg/m   Labs: Results for orders placed or performed during the hospital encounter of 08/23/19 (from the past 48 hour(s))  Glucose, capillary     Status: Abnormal   Collection Time: 08/23/19  5:36 AM  Result Value Ref Range   Glucose-Capillary 120 (H) 70 - 99 mg/dL    Comment: Glucose reference range applies only to samples taken after fasting for at least 8 hours.    Imaging / Studies: No results found.   Adin Hector, M.D., F.A.C.S. Gastrointestinal and Minimally Invasive Surgery [facility] [address], [place], [place] [address] [phone] Main / Paging  08/23/2019 7:10 AM    Adin Hector

## 2019-08-23 NOTE — Anesthesia Procedure Notes (Signed)
Procedure Name: Intubation Performed by: Tanieka Pownall J, CRNA Pre-anesthesia Checklist: Patient identified, Emergency Drugs available, Suction available, Patient being monitored and Timeout performed Patient Re-evaluated:Patient Re-evaluated prior to induction Oxygen Delivery Method: Circle system utilized Preoxygenation: Pre-oxygenation with 100% oxygen Induction Type: IV induction Ventilation: Mask ventilation without difficulty Laryngoscope Size: Mac and 3 Grade View: Grade I Tube type: Oral Tube size: 7.0 mm Number of attempts: 1 Airway Equipment and Method: Stylet Placement Confirmation: ETT inserted through vocal cords under direct vision,  positive ETCO2 and breath sounds checked- equal and bilateral Secured at: 21 cm Tube secured with: Tape Dental Injury: Teeth and Oropharynx as per pre-operative assessment        

## 2019-08-24 LAB — CBC
HCT: 28.9 % — ABNORMAL LOW (ref 36.0–46.0)
Hemoglobin: 9.6 g/dL — ABNORMAL LOW (ref 12.0–15.0)
MCH: 32.7 pg (ref 26.0–34.0)
MCHC: 33.2 g/dL (ref 30.0–36.0)
MCV: 98.3 fL (ref 80.0–100.0)
Platelets: 241 10*3/uL (ref 150–400)
RBC: 2.94 MIL/uL — ABNORMAL LOW (ref 3.87–5.11)
RDW: 12.3 % (ref 11.5–15.5)
WBC: 11.8 10*3/uL — ABNORMAL HIGH (ref 4.0–10.5)
nRBC: 0 % (ref 0.0–0.2)

## 2019-08-24 LAB — BASIC METABOLIC PANEL
Anion gap: 8 (ref 5–15)
BUN: 9 mg/dL (ref 8–23)
CO2: 26 mmol/L (ref 22–32)
Calcium: 8 mg/dL — ABNORMAL LOW (ref 8.9–10.3)
Chloride: 104 mmol/L (ref 98–111)
Creatinine, Ser: 0.67 mg/dL (ref 0.44–1.00)
GFR calc Af Amer: >60 mL/min (ref >60)
GFR calc non Af Amer: >60 mL/min (ref >60)
Glucose, Bld: 149 mg/dL — ABNORMAL HIGH (ref 70–99)
Potassium: 3.3 mmol/L — ABNORMAL LOW (ref 3.5–5.1)
Sodium: 138 mmol/L (ref 135–145)

## 2019-08-24 LAB — MAGNESIUM: Magnesium: 1.7 mg/dL (ref 1.7–2.4)

## 2019-08-24 MED ORDER — OXYCODONE-ACETAMINOPHEN 7.5-325 MG PO TABS
1.0000 | ORAL_TABLET | Freq: Three times a day (TID) | ORAL | Status: DC | PRN
  Administered 2019-08-24 – 2019-08-25 (×3): 1 via ORAL
  Filled 2019-08-24 (×3): qty 1

## 2019-08-24 NOTE — Progress Notes (Signed)
1 Day Post-Op Robotic LOA and R colectomy Subjective: Doing well.  Ambulating in hall.  Tolerating liquids.  Having flatus and BM's.  Urinating well  Objective: Vital signs in last 24 hours: Temp:  [97.4 F (36.3 C)-98.4 F (36.9 C)] 98 F (36.7 C) (04/17 0518) Pulse Rate:  [61-100] 73 (04/17 0518) Resp:  [11-27] 18 (04/17 0518) BP: (106-166)/(54-94) 124/61 (04/17 0518) SpO2:  [92 %-100 %] 97 % (04/17 0518) Weight:  [90.3 kg] 90.3 kg (04/16 1710)   Intake/Output from previous day: 04/16 0701 - 04/17 0700 In: 4484.5 [P.O.:702; I.V.:3182.5; IV Piggyback:600] Out: 750 [Urine:600; Blood:150] Intake/Output this shift: No intake/output data recorded.   General appearance: alert and cooperative GI: normal findings: soft, non-tender  Incision: moderate bloody drainage noted  Lab Results:  Recent Labs    08/24/19 0510  WBC 11.8*  HGB 9.6*  HCT 28.9*  PLT 241   BMET Recent Labs    08/24/19 0510  NA 138  K 3.3*  CL 104  CO2 26  GLUCOSE 149*  BUN 9  CREATININE 0.67  CALCIUM 8.0*   PT/INR No results for input(s): LABPROT, INR in the last 72 hours. ABG No results for input(s): PHART, HCO3 in the last 72 hours.  Invalid input(s): PCO2, PO2  MEDS, Scheduled . acetaminophen  1,000 mg Oral Q6H  . amitriptyline  25 mg Oral QHS  . enoxaparin (LOVENOX) injection  40 mg Subcutaneous Q24H  . feeding supplement  237 mL Oral BID BM  . gabapentin  300 mg Oral TID  . lisinopril  10 mg Oral Daily   And  . hydrochlorothiazide  12.5 mg Oral Daily  . lip balm  1 application Topical BID  . meloxicam  7.5 mg Oral Daily  . methocarbamol  500 mg Oral QHS    Studies/Results: No results found.  Assessment: s/p Procedure(s): XI ROBOT ASSISTED LAPAROSCOPIC PROXIMAL RIGHT COLECTOMY W/LYSIS OF ADHESIONS X2 HOURS, BILATERAL TAP BLOCK Patient Active Problem List   Diagnosis Date Noted  . Colon polyp 08/23/2019  . Adenomatous polyp of ascending colon 08/23/2019  .  Hydronephrosis   . Pyelonephritis 08/01/2017  . Class 2 severe obesity with serious comorbidity and body mass index (BMI) of 35.0 to 35.9 in adult ([place]) 06/21/2017  . History of hydronephrosis   . Low back pain   . Abdominal pain 04/26/2016  . Left flank pain   . Intractable abdominal pain 04/25/2016  . Recurrent UTI 02/20/2016  . Peripheral neuropathy 02/08/2016  . Depression 02/08/2016  . OA (osteoarthritis) of hip 07/04/2014  . 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 Pseudoarthrosis L3- L5 12/17/2012  . Nocturnal leg cramps 11/13/2012  . HLD (hyperlipidemia) 08/10/2012  . Recurrent ventral incisional hernia s/p lap repair 04/23/2013 04/27/2012  . Obesity (BMI 30-39.9) 04/27/2012  . Chronic midline thoracic back pain 04/27/2012  . Hypertension   . History of endometrial cancer s/p TAH BSO June2013 06/15/2011    Post op course as expected  Plan: Advance diet to soft foods PO pain control Cont to ambulate Recheck Hgb in AM Possible d/c tomorrow   LOS: 1 day     .Rosario Adie, MD [facility], [place]    08/24/2019 8:40 AM

## 2019-08-25 LAB — CBC
HCT: 27.1 % — ABNORMAL LOW (ref 36.0–46.0)
Hemoglobin: 9.5 g/dL — ABNORMAL LOW (ref 12.0–15.0)
MCH: 32.8 pg (ref 26.0–34.0)
MCHC: 35.1 g/dL (ref 30.0–36.0)
MCV: 93.4 fL (ref 80.0–100.0)
Platelets: 225 10*3/uL (ref 150–400)
RBC: 2.9 MIL/uL — ABNORMAL LOW (ref 3.87–5.11)
RDW: 12.4 % (ref 11.5–15.5)
WBC: 11.6 10*3/uL — ABNORMAL HIGH (ref 4.0–10.5)
nRBC: 0 % (ref 0.0–0.2)

## 2019-08-25 NOTE — Discharge Summary (Signed)
Physician Discharge Summary  Patient ID: [name] MRN: [id number] DOB/AGE: 1945/06/10 74 y.o.  Admit date: 08/23/2019 Discharge date: 08/25/2019  Admission Diagnoses: Colon polyp Discharge Diagnoses:  Active Problems:   Colon polyp   Adenomatous polyp of ascending colon   Discharged Condition: good  Hospital Course: Patient admitted after surgery.  Her diet was advanced as tolerated.  Foley was removed on postop day 1.  By postop day 2 she was tolerating a diet and having good bowel function.  Her hemoglobin was stable.  Her pain was controlled with oral medications and she was ambulating without difficulty.  It was felt that she was in stable condition for discharge to home.  Consults: None  Significant Diagnostic Studies: labs: cbc, bmet  Treatments: IV hydration, analgesia: acetaminophen and surgery: robotic R colectomy  Discharge Exam: Blood pressure 131/64, pulse 71, temperature 97.6 F (36.4 C), temperature source Oral, resp. rate 16, height 5' (1.524 m), weight 90.3 kg, SpO2 99 %. General appearance: alert and cooperative GI: soft, non-tender; bowel sounds normal; no masses,  no organomegaly Incision/Wound: clean, min drainage  Disposition: Discharge disposition: 01-Home or Self Care       Discharge Instructions    Call MD for:   Complete by: As directed    FEVER > 101.5 F  (temperatures < 101.5 F are not significant)   Call MD for:  extreme fatigue   Complete by: As directed    Call MD for:  persistant dizziness or light-headedness   Complete by: As directed    Call MD for:  persistant nausea and vomiting   Complete by: As directed    Call MD for:  redness, tenderness, or signs of infection (pain, swelling, redness, odor or green/yellow discharge around incision site)   Complete by: As directed    Call MD for:  severe uncontrolled pain   Complete by: As directed    Diet - low sodium heart healthy   Complete by: As directed    Start with a bland  diet such as soups, liquids, starchy foods, low fat foods, etc. the first few days at home. Gradually advance to a solid, low-fat, high fiber diet by the end of the first week at home.   Add a fiber supplement to your diet (Metamucil, etc) If you feel full, bloated, or constipated, stay on a full liquid or pureed/blenderized diet for a few days until you feel better and are no longer constipated.   Discharge instructions   Complete by: As directed    See Discharge Instructions If you are not getting better after two weeks or are noticing you are getting worse, contact our office [phone] for further advice.  We may need to adjust your medications, re-evaluate you in the office, send you to the emergency room, or see what other things we can do to help. The clinic staff is available to answer your questions during regular business hours (8:30am-5pm).  Please don't hesitate to call and ask to speak to one of our nurses for clinical concerns.    A surgeon from [facility] is always on call at the hospitals 24 hours/day If you have a medical emergency, go to the nearest emergency room or call 911.   Discharge wound care:   Complete by: As directed    It is good for closed incisions and even open wounds to be washed every day.  Shower every day.  Short baths are fine.  Wash the incisions and wounds clean  with soap & water.    You may leave closed incisions open to air if it is dry.   You may cover the incision with clean gauze & replace it after your daily shower for comfort.   Driving Restrictions   Complete by: As directed    You may drive when: - you are no longer taking narcotic prescription pain medication - you can comfortably wear a seatbelt - you can safely make sudden turns/stops without pain.   Increase activity slowly   Complete by: As directed    Start light daily activities --- self-care, walking, climbing stairs- beginning the day after surgery.  Gradually increase  activities as tolerated.  Control your pain to be active.  Stop when you are tired.  Ideally, walk several times a day, eventually an hour a day.   Most people are back to most day-to-day activities in a few weeks.  It takes 4-6 weeks to get back to unrestricted, intense activity. If you can walk 30 minutes without difficulty, it is safe to try more intense activity such as jogging, treadmill, bicycling, low-impact aerobics, swimming, etc. Save the most intensive and strenuous activity for last (Usually 4-8 weeks after surgery) such as sit-ups, heavy lifting, contact sports, etc.  Refrain from any intense heavy lifting or straining until you are off narcotics for pain control.  You will have off days, but things should improve week-by-week. DO NOT PUSH THROUGH PAIN.  Let pain be your guide: If it hurts to do something, don't do it.   Lifting restrictions   Complete by: As directed    If you can walk 30 minutes without difficulty, it is safe to try more intense activity such as jogging, treadmill, bicycling, low-impact aerobics, swimming, etc. Save the most intensive and strenuous activity for last (Usually 4-8 weeks after surgery) such as sit-ups, heavy lifting, contact sports, etc.   Refrain from any intense heavy lifting or straining until you are off narcotics for pain control.  You will have off days, but things should improve week-by-week. DO NOT PUSH THROUGH PAIN.  Let pain be your guide: If it hurts to do something, don't do it.  Pain is your body warning you to avoid that activity for another week until the pain goes down.   May shower / Bathe   Complete by: As directed    May walk up steps   Complete by: As directed    Remove dressing in 72 hours   Complete by: As directed    Make sure all dressings are removed by the third day after surgery.  Leave incisions open to air.  OK to cover incisions with gauze or bandages as desired   Sexual Activity Restrictions   Complete by: As directed     You may have sexual intercourse when it is comfortable. If it hurts to do something, stop.     Allergies as of 08/25/2019      Reactions   Cashew Nut Oil Hives   Dog Epithelium Hives, Itching   Lyrica [pregabalin]    Weight gain   Ondansetron Hcl Nausea And Vomiting   "made me throw up"   Sulfa Antibiotics    "blistering lips and mouth sores"      Medication List    TAKE these medications   amitriptyline 25 MG tablet Commonly known as: ELAVIL Take 1 tablet (25 mg total) by mouth at bedtime.   EX-LAX PO Take 1 tablet by mouth 2 (two) times a week.  gabapentin 300 MG capsule Commonly known as: NEURONTIN Take 1 capsule (300 mg total) by mouth 3 (three) times daily.   lisinopril-hydrochlorothiazide 10-12.5 MG tablet Commonly known as: ZESTORETIC Take 1 tablet by mouth daily.   meloxicam 7.5 MG tablet Commonly known as: MOBIC Take 1 tablet (7.5 mg total) by mouth daily.   methocarbamol 500 MG tablet Commonly known as: ROBAXIN Take 500 mg by mouth at bedtime.   oxyCODONE-acetaminophen 7.5-325 MG tablet Commonly known as: Percocet Take 1 tablet by mouth every 8 (eight) hours as needed for severe pain.            Discharge Care Instructions  (From admission, onward)         Start     Ordered   08/23/19 0000  Discharge wound care:    Comments: It is good for closed incisions and even open wounds to be washed every day.  Shower every day.  Short baths are fine.  Wash the incisions and wounds clean with soap & water.    You may leave closed incisions open to air if it is dry.   You may cover the incision with clean gauze & replace it after your daily shower for comfort.   08/23/19 1124         Follow-up Information    Michael Boston, MD. Schedule an appointment as soon as possible for a visit in 3 weeks.   Specialty: General Surgery Why: To follow up after your operation, To follow up after your hospital stay Contact information: [address] [place] [address] [phone]           Signed: Rosario Adie [id number], 8:34 AM

## 2019-08-25 NOTE — Discharge Instructions (Addendum)
SURGERY: POST OP INSTRUCTIONS (Surgery for small bowel obstruction, colon resection, etc)   ######################################################################  EAT Gradually transition to a high fiber diet with a fiber supplement over the next few days after discharge  WALK Walk an hour a day.  Control your pain to do that.    CONTROL PAIN Control pain so that you can walk, sleep, tolerate sneezing/coughing, go up/down stairs.  HAVE A BOWEL MOVEMENT DAILY Keep your bowels regular to avoid problems.  OK to try a laxative to override constipation.  OK to use an antidairrheal to slow down diarrhea.  Call if not better after 2 tries  CALL IF YOU HAVE PROBLEMS/CONCERNS Call if you are still struggling despite following these instructions. Call if you have concerns not answered by these instructions  ######################################################################   DIET Follow a light diet the first few days at home.  Start with a bland diet such as soups, liquids, starchy foods, low fat foods, etc.  If you feel full, bloated, or constipated, stay on a ful liquid or pureed/blenderized diet for a few days until you feel better and no longer constipated. Be sure to drink plenty of fluids every day to avoid getting dehydrated (feeling dizzy, not urinating, etc.). Gradually add a fiber supplement to your diet over the next week.  Gradually get back to a regular solid diet.  Avoid fast food or heavy meals the first week as you are more likely to get nauseated. It is expected for your digestive tract to need a few months to get back to normal.  It is common for your bowel movements and stools to be irregular.  You will have occasional bloating and cramping that should eventually fade away.  Until you are eating solid food normally, off all pain medications, and back to regular activities; your bowels will not be normal. Focus on eating a low-fat, high fiber diet the rest of your life  (See Getting to [place], below).  CARE of your INCISION or WOUND It is good for closed incision and even open wounds to be washed every day.  Shower every day.  Short baths are fine.  Wash the incisions and wounds clean with soap & water.    If you have a closed incision(s), wash the incision with soap & water every day.  You may leave closed incisions open to air if it is dry.   Cover the incision with clean gauze & replace it after your daily shower until all drainage stops If you have skin tapes (Steristrips) or skin glue (Dermabond) on your incision, leave them in place.  They will fall off on their own like a scab.  You may trim any edges that curl up with clean scissors.  If you have staples, set up an appointment for them to be removed in the office in 10 days after surgery.  If you have a drain, wash around the skin exit site with soap & water and place a new dressing of gauze or band aid around the skin every day.  Keep the drain site clean & dry.    If you have an open wound with packing, see wound care instructions.  In general, it is encouraged that you remove your dressing and packing, shower with soap & water, and replace your dressing once a day.  Pack the wound with clean gauze moistened with normal (0.9%) saline to keep the wound moist & uninfected.  Pressure on the dressing for 30 minutes will stop most wound  bleeding.  Eventually your body will heal & pull the open wound closed over the next few months.  Raw open wounds will occasionally bleed or secrete yellow drainage until it heals closed.  Drain sites will drain a little until the drain is removed.  Even closed incisions can have mild bleeding or drainage the first few days until the skin edges scab over & seal.   If you have an open wound with a wound vac, see wound vac care instructions.     ACTIVITIES as tolerated Start light daily activities --- self-care, walking, climbing stairs-- beginning the day after surgery.   Gradually increase activities as tolerated.  Control your pain to be active.  Stop when you are tired.  Ideally, walk several times a day, eventually an hour a day.   Most people are back to most day-to-day activities in a few weeks.  It takes 4-8 weeks to get back to unrestricted, intense activity. If you can walk 30 minutes without difficulty, it is safe to try more intense activity such as jogging, treadmill, bicycling, low-impact aerobics, swimming, etc. Save the most intensive and strenuous activity for last (Usually 4-8 weeks after surgery) such as sit-ups, heavy lifting, contact sports, etc.  Refrain from any intense heavy lifting or straining until you are off narcotics for pain control.  You will have off days, but things should improve week-by-week. DO NOT PUSH THROUGH PAIN.  Let pain be your guide: If it hurts to do something, don't do it.  Pain is your body warning you to avoid that activity for another week until the pain goes down. You may drive when you are no longer taking narcotic prescription pain medication, you can comfortably wear a seatbelt, and you can safely make sudden turns/stops to protect yourself without hesitating due to pain. You may have sexual intercourse when it is comfortable. If it hurts to do something, stop.  MEDICATIONS Take your usually prescribed home medications unless otherwise directed.   Blood thinners:  Usually you can restart any strong blood thinners after the second postoperative day.  It is OK to take aspirin right away.     If you are on strong blood thinners (warfarin/Coumadin, Plavix, Xerelto, Eliquis, Pradaxa, etc), discuss with your surgeon, medicine PCP, and/or cardiologist for instructions on when to restart the blood thinner & if blood monitoring is needed (PT/INR blood check, etc).     PAIN CONTROL Pain after surgery or related to activity is often due to strain/injury to muscle, tendon, nerves and/or incisions.  This pain is usually  short-term and will improve in a few months.  To help speed the process of healing and to get back to regular activity more quickly, DO THE FOLLOWING THINGS TOGETHER: 1. Increase activity gradually.  DO NOT PUSH THROUGH PAIN 2. Use Ice and/or Heat 3. Try Gentle Massage and/or Stretching 4. Take over the counter pain medication 5. Take Narcotic prescription pain medication for more severe pain  Good pain control = faster recovery.  It is better to take more medicine to be more active than to stay in bed all day to avoid medications. 1.  Increase activity gradually Avoid heavy lifting at first, then increase to lifting as tolerated over the next 6 weeks. Do not "push through" the pain.  Listen to your body and avoid positions and maneuvers than reproduce the pain.  Wait a few days before trying something more intense Walking an hour a day is encouraged to help your body recover faster   and more safely.  Start slowly and stop when getting sore.  If you can walk 30 minutes without stopping or pain, you can try more intense activity (running, jogging, aerobics, cycling, swimming, treadmill, sex, sports, weightlifting, etc.) Remember: If it hurts to do it, then don't do it! 2. Use Ice and/or Heat You will have swelling and bruising around the incisions.  This will take several weeks to resolve. Ice packs or heating pads (6-8 times a day, 30-60 minutes at a time) will help sooth soreness & bruising. Some people prefer to use ice alone, heat alone, or alternate between ice & heat.  Experiment and see what works best for you.  Consider trying ice for the first few days to help decrease swelling and bruising; then, switch to heat to help relax sore spots and speed recovery. Shower every day.  Short baths are fine.  It feels good!  Keep the incisions and wounds clean with soap & water.   3. Try Gentle Massage and/or Stretching Massage at the area of pain many times a day Stop if you feel pain - do not  overdo it 4. Take over the counter pain medication This helps the muscle and nerve tissues become less irritable and calm down faster Choose ONE of the following over-the-counter anti-inflammatory medications: Acetaminophen 500mg tabs (Tylenol) 1-2 pills with every meal and just before bedtime (avoid if you have liver problems or if you have acetaminophen in you narcotic prescription) Naproxen 220mg tabs (ex. Aleve, Naprosyn) 1-2 pills twice a day (avoid if you have kidney, stomach, IBD, or bleeding problems) Ibuprofen 200mg tabs (ex. Advil, Motrin) 3-4 pills with every meal and just before bedtime (avoid if you have kidney, stomach, IBD, or bleeding problems) Take with food/snack several times a day as directed for at least 2 weeks to help keep pain / soreness down & more manageable. 5. Take Narcotic prescription pain medication for more severe pain A prescription for strong pain control is often given to you upon discharge (for example: oxycodone/Percocet, hydrocodone/Norco/Vicodin, or tramadol/Ultram) Take your pain medication as prescribed. Be mindful that most narcotic prescriptions contain Tylenol (acetaminophen) as well - avoid taking too much Tylenol. If you are having problems/concerns with the prescription medicine (does not control pain, nausea, vomiting, rash, itching, etc.), please call us [phone] to see if we need to switch you to a different pain medicine that will work better for you and/or control your side effects better. If you need a refill on your pain medication, you must call the office before 4 pm and on weekdays only.  By federal law, prescriptions for narcotics cannot be called into a pharmacy.  They must be filled out on paper & picked up from our office by the patient or authorized caretaker.  Prescriptions cannot be filled after 4 pm nor on weekends.    WHEN TO CALL US [phone] Severe uncontrolled or worsening pain  Fever over 101 F (38.5 C) Concerns with  the incision: Worsening pain, redness, rash/hives, swelling, bleeding, or drainage Reactions / problems with new medications (itching, rash, hives, nausea, etc.) Nausea and/or vomiting Difficulty urinating Difficulty breathing Worsening fatigue, dizziness, lightheadedness, blurred vision Other concerns If you are not getting better after two weeks or are noticing you are getting worse, contact our office [phone] for further advice.  We may need to adjust your medications, re-evaluate you in the office, send you to the emergency room, or see what other things we can do to help. The   clinic staff is available to answer your questions during regular business hours (8:30am-5pm).  Please don't hesitate to call and ask to speak to one of our nurses for clinical concerns.    A surgeon from [facility] is always on call at the hospitals 24 hours/day If you have a medical emergency, go to the nearest emergency room or call 911.  FOLLOW UP in our office One the day of your discharge from the hospital (or the next business weekday), please call [facility] to set up or confirm an appointment to see your surgeon in the office for a follow-up appointment.  Usually it is 2-3 weeks after your surgery.   If you have skin staples at your incision(s), let the office know so we can set up a time in the office for the nurse to remove them (usually around 10 days after surgery). Make sure that you call for appointments the day of discharge (or the next business weekday) from the hospital to ensure a convenient appointment time. IF YOU HAVE DISABILITY OR FAMILY LEAVE FORMS, BRING THEM TO THE OFFICE FOR PROCESSING.  DO NOT GIVE THEM TO YOUR DOCTOR.  [facility] [address], [address], , [place]  [address] ? [phone] - Main [phone] - Toll Free,  [phone] - Fax [email or web address]  GETTING TO GOOD BOWEL HEALTH. It is  expected for your digestive tract to need a few months to get back to normal.  It is common for your bowel movements and stools to be irregular.  You will have occasional bloating and cramping that should eventually fade away.  Until you are eating solid food normally, off all pain medications, and back to regular activities; your bowels will not be normal.   Avoiding constipation The goal: ONE SOFT BOWEL MOVEMENT A DAY!    Drink plenty of fluids.  Choose water first. TAKE A FIBER SUPPLEMENT EVERY DAY THE REST OF YOUR LIFE During your first week back home, gradually add back a fiber supplement every day Experiment which form you can tolerate.   There are many forms such as powders, tablets, wafers, gummies, etc Psyllium bran (Metamucil), methylcellulose (Citrucel), Miralax or Glycolax, Benefiber, Flax Seed.  Adjust the dose week-by-week (1/2 dose/day to 6 doses a day) until you are moving your bowels 1-2 times a day.  Cut back the dose or try a different fiber product if it is giving you problems such as diarrhea or bloating. Sometimes a laxative is needed to help jump-start bowels if constipated until the fiber supplement can help regulate your bowels.  If you are tolerating eating & you are farting, it is okay to try a gentle laxative such as double dose MiraLax, prune juice, or Milk of Magnesia.  Avoid using laxatives too often. Stool softeners can sometimes help counteract the constipating effects of narcotic pain medicines.  It can also cause diarrhea, so avoid using for too long. If you are still constipated despite taking fiber daily, eating solids, and a few doses of laxatives, call our office. Controlling diarrhea Try drinking liquids and eating bland foods for a few days to avoid stressing your intestines further. Avoid dairy products (especially milk & ice cream) for a short time.  The intestines often can lose the ability to digest lactose when stressed. Avoid foods that cause gassiness or  bloating.  Typical foods include beans and other legumes, cabbage, broccoli, and dairy foods.  Avoid greasy, spicy, fast foods.  Every person has   some sensitivity to other foods, so listen to your body and avoid those foods that trigger problems for you. Probiotics (such as active yogurt, Align, etc) may help repopulate the intestines and colon with normal bacteria and calm down a sensitive digestive tract Adding a fiber supplement gradually can help thicken stools by absorbing excess fluid and retrain the intestines to act more normally.  Slowly increase the dose over a few weeks.  Too much fiber too soon can backfire and cause cramping & bloating. It is okay to try and slow down diarrhea with a few doses of antidiarrheal medicines.   Bismuth subsalicylate (ex. Kayopectate, Pepto Bismol) for a few doses can help control diarrhea.  Avoid if pregnant.   Loperamide (Imodium) can slow down diarrhea.  Start with one tablet (15m) first.  Avoid if you are having fevers or severe pain.  ILEOSTOMY PATIENTS WILL HAVE CHRONIC DIARRHEA since their colon is not in use.    Drink plenty of liquids.  You will need to drink even more glasses of water/liquid a day to avoid getting dehydrated. Record output from your ileostomy.  Expect to empty the bag every 3-4 hours at first.  Most people with a permanent ileostomy empty their bag 4-6 times at the least.   Use antidiarrheal medicine (especially Imodium) several times a day to avoid getting dehydrated.  Start with a dose at bedtime & breakfast.  Adjust up or down as needed.  Increase antidiarrheal medications as directed to avoid emptying the bag more than 8 times a day (every 3 hours). Work with your wound ostomy nurse to learn care for your ostomy.  See ostomy care instructions. TROUBLESHOOTING IRREGULAR BOWELS 1) Start with a soft & bland diet. No spicy, greasy, or fried foods.  2) Avoid gluten/wheat or dairy products from diet to see if symptoms improve. 3) Miralax  17gm or flax seed mixed in 8Alger water or juice-daily. May use 2-4 times a day as needed. 4) Gas-X, Phazyme, etc. as needed for gas & bloating.  5) Prilosec (omeprazole) over-the-counter as needed 6)  Consider probiotics (Align, Activa, etc) to help calm the bowels down  Call your doctor if you are getting worse or not getting better.  Sometimes further testing (cultures, endoscopy, X-ray studies, CT scans, bloodwork, etc.) may be needed to help diagnose and treat the cause of the diarrhea. [facility] [place]  [phone] Main.    [phone] - Toll Free.   [phone] Fax [email or web address]

## 2019-08-25 NOTE — Progress Notes (Signed)
Discharge instructions given to pt and all questions were answered.  

## 2019-08-26 LAB — SURGICAL PATHOLOGY

## 2019-09-04 DIAGNOSIS — R8271 Bacteriuria: Secondary | ICD-10-CM | POA: Diagnosis not present

## 2019-09-04 DIAGNOSIS — N135 Crossing vessel and stricture of ureter without hydronephrosis: Secondary | ICD-10-CM | POA: Diagnosis not present

## 2019-09-10 ENCOUNTER — Other Ambulatory Visit: Payer: Self-pay | Admitting: Urology

## 2019-09-11 ENCOUNTER — Other Ambulatory Visit: Payer: Self-pay | Admitting: Urology

## 2019-09-11 IMAGING — CT CT ABD-PELV W/O CM
2 of 4 series · 16 of 46 positions shown, 18 images · non-contrast
Comparison: Renal ultrasound dated 10/21/2017 and abdomen and
pelvis CT dated 08/01/2017.

CLINICAL DATA: Chronic left flank pain, worse since yesterday.
Taking antibiotics for urinary tract infection. Fever. Clinical
concern for abscess.

EXAM:
CT ABDOMEN AND PELVIS WITHOUT CONTRAST
TECHNIQUE: Multidetector CT imaging of the abdomen and pelvis was performed
following the standard protocol without IV contrast.

[Series 2: axial (person_name) (person_name) · axial · 0.94mm/px · z∈[-609,-239]mm · 13 of 84 slices shown, 15 images]
[im 5/84  soft-tissue]
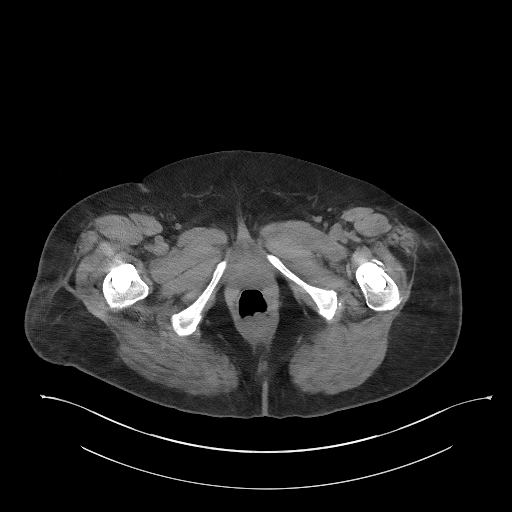
[im 5/84  bone]
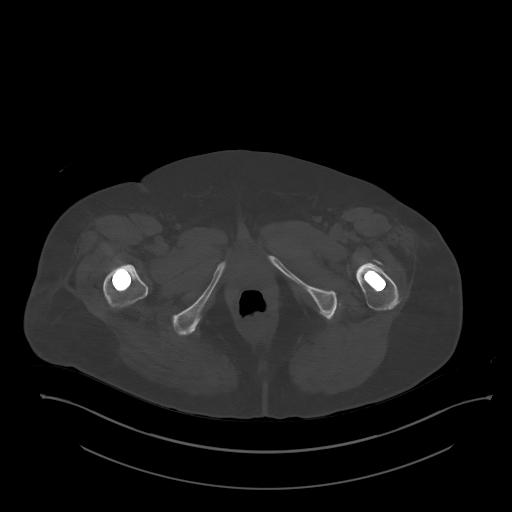
[im 14/84  soft-tissue]
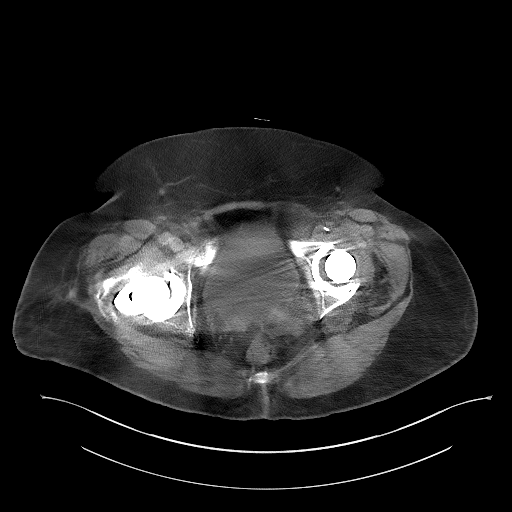
[im 18/84  soft-tissue]
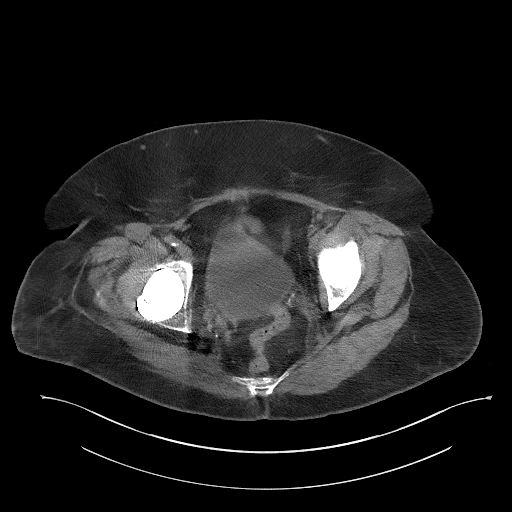
[im 22/84  soft-tissue]
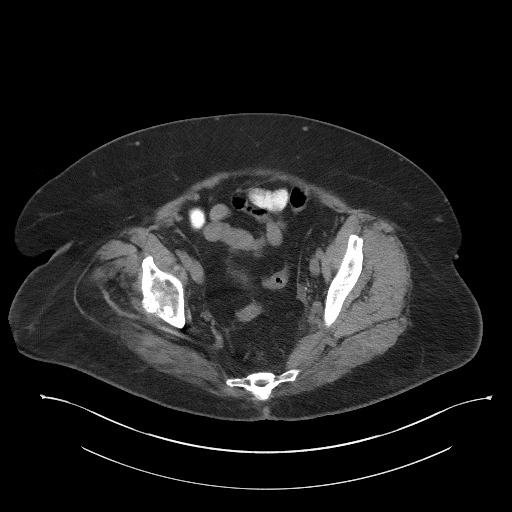
[im 31/84  soft-tissue]
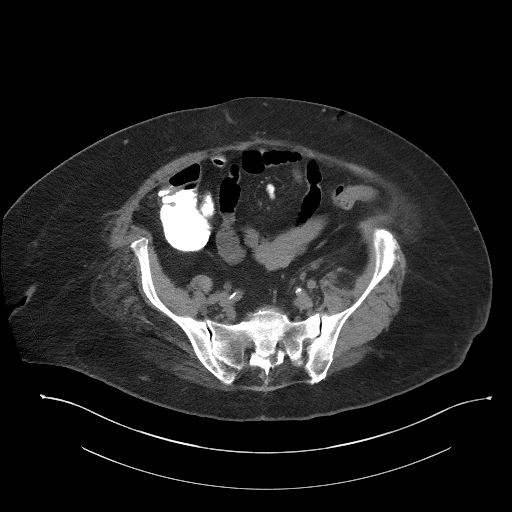
[im 35/84  soft-tissue]
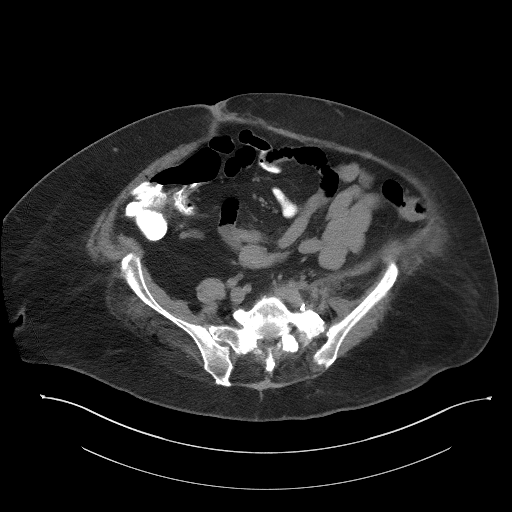
[im 44/84  soft-tissue]
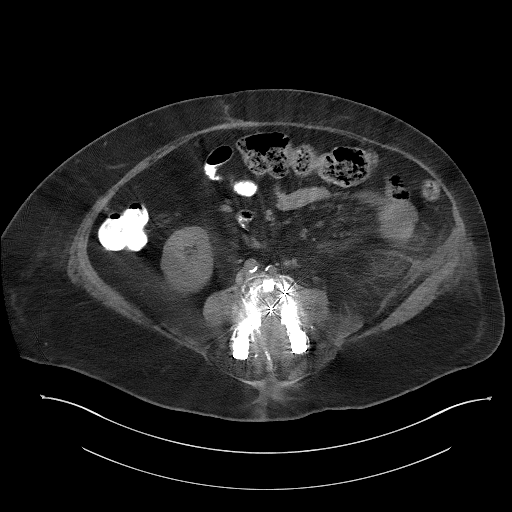
[im 49/84  soft-tissue]
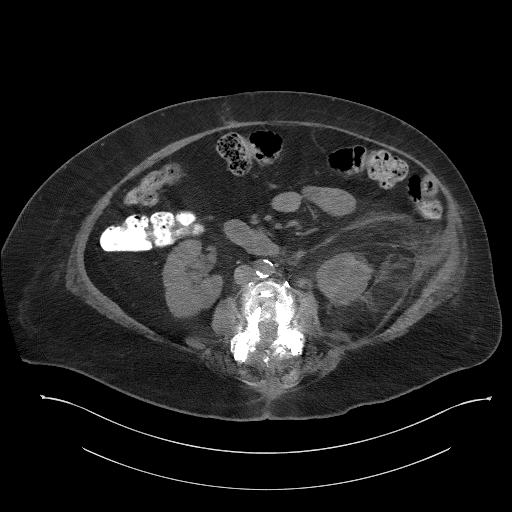
[im 53/84  soft-tissue]
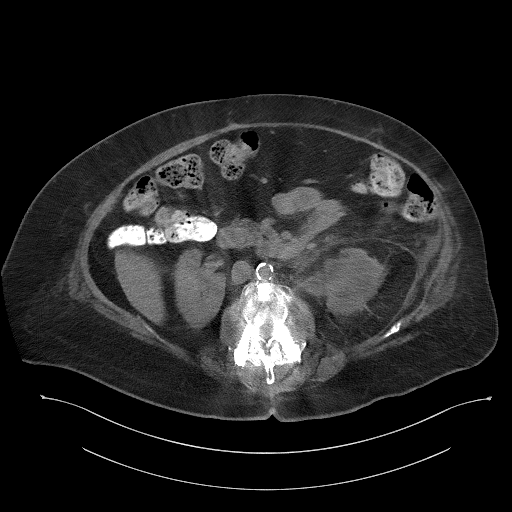
[im 53/84  bone]
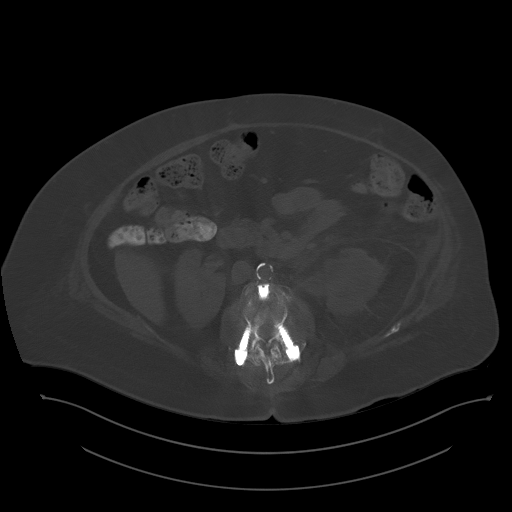
[im 62/84  soft-tissue]
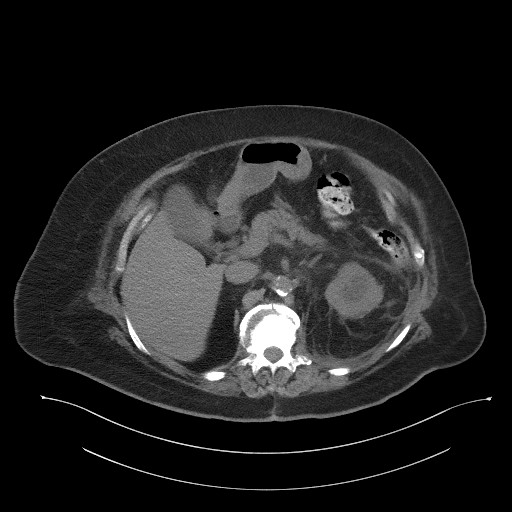
[im 66/84  soft-tissue]
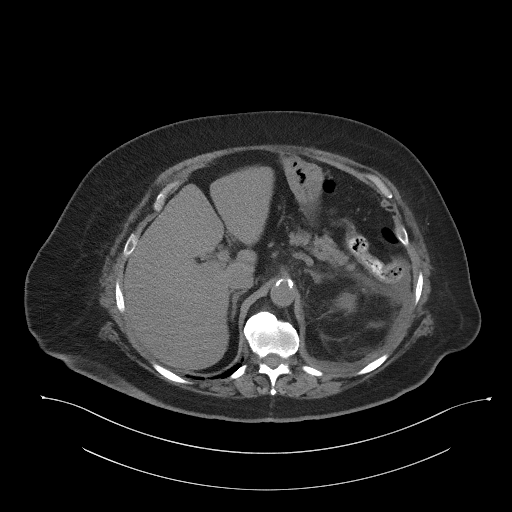
[im 70/84  soft-tissue]
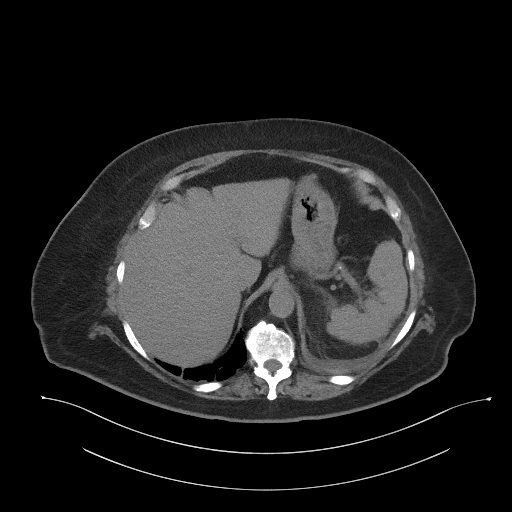
[im 79/84  soft-tissue]
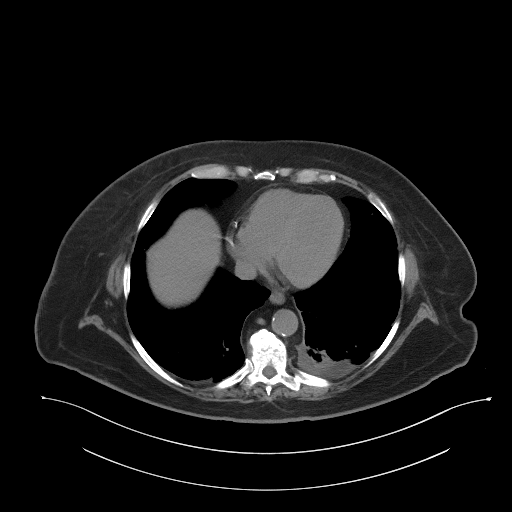

[Series 5: coronal st · coronal · 0.85mm/px · 3 of 101 slices shown]
[im 34/101  soft-tissue]
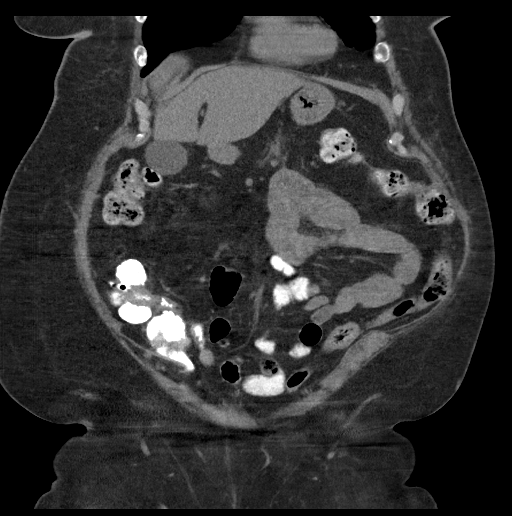
[im 45/101  soft-tissue]
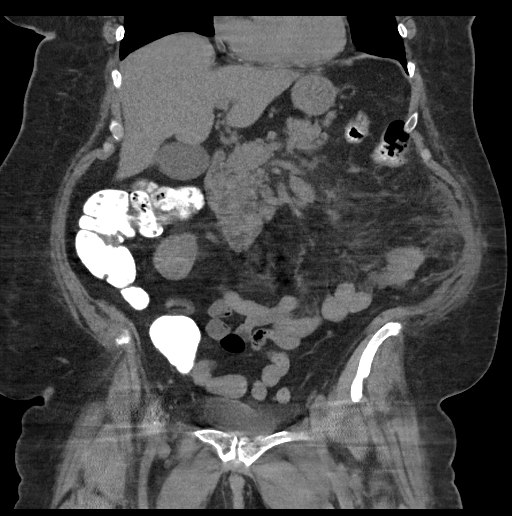
[im 56/101  soft-tissue]
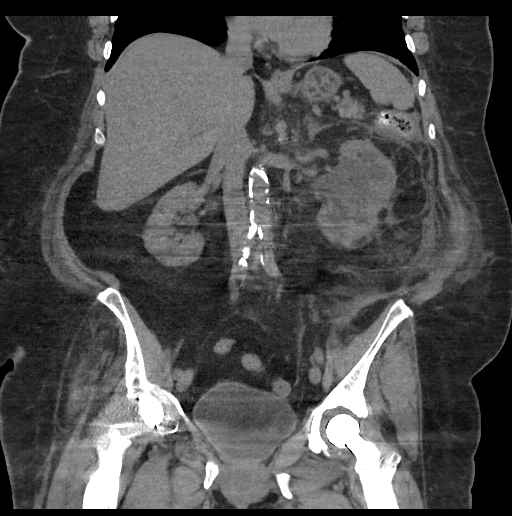

[16 of 46 positions shown; findings below may reference images not displayed]

FINDINGS: Lower chest: Small left pleural effusion. Mild left basilar
atelectasis and minimal right basilar atelectasis.

Hepatobiliary: No focal liver abnormality is seen. No gallstones,
gallbladder wall thickening, or biliary dilatation.

Pancreas: Unremarkable. No pancreatic ductal dilatation or
surrounding inflammatory changes.

Spleen: Normal in size without focal abnormality.

Adrenals/Urinary Tract: Normal appearing adrenal glands. Interval
mild dilatation of the right renal collecting system and ureter to
the level of the urinary bladder with no obstructing mass or
calculus seen. Marked dilatation of the left renal collecting system
and mild dilatation of the left ureter the level of the urinary
bladder with no obstructing mass or calculus visualized. Mild
asymmetrical left renal atrophy is stable. Left perinephric soft
tissue stranding with improvement. Small amount of fluid and soft
tissue stranding in the left pararenal space extending inferiorly
into the upper pelvis. No fluid collections suspicious for abscess.

Stomach/Bowel: Unremarkable stomach, small bowel and colon. No
evidence of appendicitis.

Vascular/Lymphatic: Atheromatous arterial calcifications without
aneurysm. No enlarged lymph nodes.

Reproductive: Status post hysterectomy. No adnexal masses.

Other: Midline surgical scar.

Musculoskeletal: Bilateral hip prostheses. Lumbar spine laminectomy
defects with anterior posterior fixation hardware at the L2 through
L4 levels and Ray cages at the L4-5 level. Extensive lumbar and
lower thoracic spine degenerative changes.
IMPRESSION: 1. Interval mild right hydronephrosis and hydroureter with no
obstructing stone or mass visualized.
2. Stable marked left hydronephrosis and mild left hydroureter with
no obstructing stone or mass visualized. This remains compatible
with a chronic partial left UPJ obstruction with associated mild
left renal atrophy.
3. Mildly improved perinephric edema on the left with increased
pararenal edema and fluid extending into the upper left pelvis.
4. Interval small left pleural effusion.
5. No findings suspicious for abscess formation.

## 2019-09-19 ENCOUNTER — Encounter: Payer: Self-pay | Admitting: Nurse Practitioner

## 2019-09-19 ENCOUNTER — Ambulatory Visit (INDEPENDENT_AMBULATORY_CARE_PROVIDER_SITE_OTHER): Payer: Medicare HMO | Admitting: Nurse Practitioner

## 2019-09-19 ENCOUNTER — Other Ambulatory Visit: Payer: Self-pay

## 2019-09-19 VITALS — BP 115/59 | HR 59 | Temp 97.3°F | Resp 20 | Ht 60.0 in | Wt 195.0 lb

## 2019-09-19 DIAGNOSIS — G629 Polyneuropathy, unspecified: Secondary | ICD-10-CM | POA: Diagnosis not present

## 2019-09-19 DIAGNOSIS — I10 Essential (primary) hypertension: Secondary | ICD-10-CM

## 2019-09-19 DIAGNOSIS — F3341 Major depressive disorder, recurrent, in partial remission: Secondary | ICD-10-CM

## 2019-09-19 DIAGNOSIS — Z6835 Body mass index (BMI) 35.0-35.9, adult: Secondary | ICD-10-CM | POA: Diagnosis not present

## 2019-09-19 DIAGNOSIS — M5440 Lumbago with sciatica, unspecified side: Secondary | ICD-10-CM

## 2019-09-19 DIAGNOSIS — G8929 Other chronic pain: Secondary | ICD-10-CM | POA: Diagnosis not present

## 2019-09-19 DIAGNOSIS — E782 Mixed hyperlipidemia: Secondary | ICD-10-CM

## 2019-09-19 DIAGNOSIS — R69 Illness, unspecified: Secondary | ICD-10-CM | POA: Diagnosis not present

## 2019-09-19 MED ORDER — OXYCODONE-ACETAMINOPHEN 7.5-325 MG PO TABS: 1.0000 | ORAL_TABLET | Freq: Three times a day (TID) | ORAL | 0 refills | Status: AC | PRN

## 2019-09-19 MED ORDER — OXYCODONE-ACETAMINOPHEN 7.5-325 MG PO TABS: 1.0000 | ORAL_TABLET | Freq: Three times a day (TID) | ORAL | 0 refills | Status: DC | PRN

## 2019-09-19 MED ORDER — LISINOPRIL-HYDROCHLOROTHIAZIDE 10-12.5 MG PO TABS: 1.0000 | ORAL_TABLET | Freq: Every day | ORAL | 1 refills | Status: DC

## 2019-09-19 NOTE — Patient Instructions (Signed)
Opioid Pain Medicine Management Opioid pain medicines are strong medicines that are used to treat bad or very bad pain. When you take them for a short time, they can help you:  Sleep better.  Do better in physical therapy.  Feel better during the first few days after you get hurt.  Recover from surgery. Only take these medicines if a doctor says that you can. You should only take them for a short time. This is because opioids can be hard to stop taking (they are addictive). The longer you take opioids, the harder it may be to stop taking them (opioid use disorder). What are the risks? Opioids can cause problems (side effects). Taking them for more than 3 days raises your chance of problems, such as:  Trouble pooping (constipation).  Feeling sick to your stomach (nausea).  Vomiting.  Feeling very sleepy.  Confusion.  Not being able to stop taking the medicine.  Breathing problems. Taking opioids for a long time can make it hard for you to do daily tasks. It can also put you at risk for:  Car accidents.  Depression.  Suicide.  Heart attack.  Taking too much of the medicine (overdose), which can sometimes lead to death. What is a pain treatment plan? A pain treatment plan is a plan made by you and your doctor. Work with your doctor to make a plan for treating your pain. To help you do this:  Talk about the goals of your treatment, including: ? How much pain you might expect to have. ? How you will manage the pain.  Talk about the risks and benefits of taking these medicines for your condition.  Remember that a good treatment plan uses more than one approach and lowers the risks of side effects.  Tell your doctor about the amount of medicines you take and about any drug or alcohol use.  Get your pain medicine prescriptions from only one doctor. Pain can be managed with other treatments. Work with your doctor to find other ways to help your pain, such as:  Physical  therapy.  Counseling.  Eating healthy foods.  Brain exercises.  Massage.  Meditation.  Other pain medicines.  Doing gentle exercises. Tapering your use of opioids If you have been taking opioids for more than a few weeks, you may need to slowly decrease (taper) how much you take until you stop taking them. Doing this can lower your chance of having symptoms, such as:  Pain and cramping in your belly (abdomen).  Feeling sick to your stomach.  Sweating.  Feeling very sleepy.  Feeling restless.  Shaking you cannot control (tremors).  Cravings for the medicine. Do not try to stop taking them by yourself. Work with your doctor to stop. Your doctor will help you take less until you are not taking the medicine at all. Follow these instructions at home: Safety and storage   While you are taking opioids: ? Do not drive. ? Do not use machines or power tools. ? Do not sign important papers (legal documents). ? Do not drink alcohol. ? Do not take sleeping pills. ? Do not take care of children by yourself. ? Do not do activities where you need to climb or be in high places, like working on a ladder. ? Do not go into any water, such as a lake, river, ocean, swimming pool, or hot tub.  Keep your opioids locked up or in a place where children cannot reach them.  Do not share your   pain medicine with anyone. Getting rid of leftover pills Do not save any leftover pills. Get rid of leftover pills safely by:  Taking them to a take-back program in your area.  Bringing them to a pharmacy that has a container for throwing away pills (pill disposal).  Throwing them in the trash. Check the label or package insert of your medicine to see whether this is safe to do. If it is safe to throw them out: 1. Take the pills out of their container. 2. Mix the pills with pet poop or food scraps. 3. Put this in the trash. Activity  Return to your normal activities as told by your doctor. Ask  your doctor what activities are safe for you.  Avoid doing things that make your pain worse.  Do exercises as told by your doctor. General instructions  You may need to take these actions to prevent or treat trouble pooping: ? Drink enough fluid to keep your pee (urine) pale yellow. ? Take over-the-counter or prescription medicines. ? Eat foods that are high in fiber. These include beans, whole grains, and fresh fruits and vegetables. ? Limit foods that are high in fat and sugar. These include fried or sweet foods.  Keep all follow-up visits as told by your doctor. This is important. Where to find support If you have been taking opioids for a long time, think about getting help quitting from a local support group or counselor. Ask your doctor about this. Where to find more information Centers for Disease Control and Prevention (CDC): www.cdc.gov Get help right away if: Seek medical care right away if you are taking opioids and you, or people close to you, notice any of the following:  You have trouble breathing.  Your breathing is slower or more shallow than normal.  You have a very slow heartbeat.  You feel very confused.  You pass out (faint).  You are very sleepy.  Your speech is not normal.  You feel sick to your stomach and vomit.  You have cold skin.  You have blue lips or fingernails.  Your muscles are weak (limp) and your body seems floppy.  The black centers of your eyes (pupils) are smaller than normal. If you think that you or someone else may have taken too much of an opioid medicine, get medical help right away. Call your local emergency services (911 in the [place] Do not drive yourself to the hospital. If you ever feel like you may hurt yourself or others, or have thoughts about taking your own life, get help right away. You can go to your nearest emergency department or call:  Your local emergency services (911 in the [place]  The hotline of the National  Poison Control Center (1-800-222-1222 in the [place]  A suicide crisis helpline, such as the National Suicide Prevention Lifeline at 1-800-273-8255. This is open 24 hours a day. Summary  Opioid are strong medicines that are used to treat bad or very bad pain.  A pain treatment plan is a plan made by you and your doctor. Work with your doctor to make a plan for treating your pain.  Work with your doctor to find other ways to help your pain.  If you think that you or someone else may have taken too much of an opioid, get help right away. This information is not intended to replace advice given to you by your health care provider. Make sure you discuss any questions you have with your health care provider.   Document Revised: 05/25/2018 Document Reviewed: 05/25/2018 Elsevier Patient Education  2020 Elsevier Inc.  

## 2019-09-19 NOTE — Progress Notes (Signed)
Subjective:    Patient ID: [name], female    DOB: May 08, 1946, 74 y.o.   MRN: [id number]   Chief Complaint: Medical Management of Chronic Issues    HPI:  1. Essential hypertension No c/o chest pain, ob or headaches. Does not check blood pressure at home. BP Readings from Last 3 Encounters:  09/19/19 (!) 115/59  08/25/19 (!) 112/58  08/15/19 (!) 167/75     2. Mixed hyperlipidemia Does not watch diet and does no exercise. Lab Results  Component Value Date   CHOL 190 06/20/2018   HDL 71 06/20/2018   LDLCALC 93 06/20/2018   TRIG 128 06/20/2018   CHOLHDL 2.7 06/20/2018     3. Recurrent major depressive disorder, in partial remission ([place]) Is currently not on antidepressant. Depression screen [facility] 2/9 09/19/2019 03/22/2019 12/19/2018  Decreased Interest 0 0 0  Down, Depressed, Hopeless 0 0 0  PHQ - 2 Score 0 0 0  Some recent data might be hidden     4. Peripheral polyneuropathy Has constant burning in hr feet- neurontin help.  5. Chronic midline thoracic back pain Pain assessment: Cause of pain- Arthritis- dx by Dr. Ellene Route- ha had 4 back surgeries Pain location- lower back mainly Pain on scale of 1-10- 3/10 currently Frequency- daily What increases pain- standing increases pain What makes pain Better-itting and resting Effects on ADL - none Any change in general medical condition-none  Current opioids rx- percocet 7.5/325 TID # meds rx- 90 Effectiveness of current meds-helps Adverse reactions form pain meds-none Morphine equivalent- 33 MEDD  Pill count performed-No Last drug screen - never ( high risk q52m moderate risk q626mlow risk yearly ) Urine drug screen today- Yes Was the [place]- yes  If yes were their any concerning findings? - none   Overdose risk: 1  Pain contract signed on: 12/27/19   6. Insomnia Is on elavil nightly to sleep. Has not been working as well as it use to. wakes up frequently at night.  7.  Class 2 severe  obesity with serious comorbidity and body mass index (BMI) of 35.0 to 35.9 in adult, unspecified [place] type [place] Wt Readings from Last 3 Encounters:  09/19/19 195 lb (88.5 kg)  08/23/19 199 lb 1.2 oz (90.3 kg)  08/15/19 199 lb (90.3 kg)   BMI Readings from Last 3 Encounters:  09/19/19 38.08 kg/m  08/23/19 38.88 kg/m  08/15/19 38.86 kg/m       Outpatient Encounter Medications as of 09/19/2019  Medication Sig  . amitriptyline (ELAVIL) 25 MG tablet Take 1 tablet (25 mg total) by mouth at bedtime.  . gabapentin (NEURONTIN) 300 MG capsule Take 1 capsule (300 mg total) by mouth 3 (three) times daily.  . Marland Kitchenisinopril-hydrochlorothiazide (ZESTORETIC) 10-12.5 MG tablet Take 1 tablet by mouth daily.  . meloxicam (MOBIC) 7.5 MG tablet Take 1 tablet (7.5 mg total) by mouth daily.  . methocarbamol (ROBAXIN) 500 MG tablet Take 500 mg by mouth at bedtime.   . Marland KitchenxyCODONE-acetaminophen (PERCOCET) 7.5-325 MG tablet Take 1 tablet by mouth every 8 (eight) hours as needed for severe pain.  . Sennosides (EX-LAX PO) Take 1 tablet by mouth 2 (two) times a week.      Past Surgical History:  Procedure Laterality Date  . ABDOMINAL HYSTERECTOMY  11/02/10  dr geAlycia Rossetti'@[facility]'    TAH, BSO, incisional hernia repair , lysis of adhesions for endometrial cancer  . ANTERIOR LUMBAR FUSION  01-28-2008   dr elsner  @  [place]   L2 -- L4  . BACK SURGERY    . BUNIONECTOMY Right 2003  . CATARACT EXTRACTION W/ INTRAOCULAR LENS  IMPLANT, BILATERAL  2007  . COLONOSCOPY  last one 12-24-2018  . CYSTO/  LEFT RETROGRADE PYELOGRAM/ URETEROSCOPY STENT PLACEMENT/  OPEN URETEROLYSIS WITH OMENTAL FLAP Left 03-11-2009    dr borden  '@[facility]'   . CYSTO/  [facility] SLING/  ANTERIOR REPAIR  12-06-2001    dr Jeffie Pollock '@[facility]'   . CYSTOSCOPY W/ URETERAL STENT PLACEMENT Left 12/07/2017   Procedure: CYSTOSCOPY WITH RETROGRADE PYELOGRAM/URETERAL STENT PLACEMENT;  Surgeon: Raynelle Bring, MD;  Location: [facility];  Service: Urology;   Laterality: Left;  . CYSTOSCOPY W/ URETERAL STENT PLACEMENT Left 03/29/2018   Procedure: CYSTOSCOPY WITH STENT  EXCHANGE;  Surgeon: Raynelle Bring, MD;  Location: [facility];  Service: Urology;  Laterality: Left;  . CYSTOSCOPY WITH RETROGRADE PYELOGRAM, URETEROSCOPY AND STENT PLACEMENT Left 12-29-2008   dr Alinda Money  '@[facility]'    WITH BALLOON DILATION LEFT URETERAL STRICTURE  . CYSTOSCOPY WITH RETROGRADE PYELOGRAM, URETEROSCOPY AND STENT PLACEMENT Left 09-12-2008    dr Jeffie Pollock '@[facility]'   . CYSTOSCOPY WITH STENT PLACEMENT Left 07/26/2018   Procedure: CYSTOSCOPY WITH STENT CHANGE;  Surgeon: Raynelle Bring, MD;  Location: [facility];  Service: Urology;  Laterality: Left;  . CYSTOSCOPY WITH STENT PLACEMENT Left 12/03/2018   Procedure: CYSTOSCOPY WITH STENT CHANGE;  Surgeon: Raynelle Bring, MD;  Location: [facility];  Service: Urology;  Laterality: Left;  . CYSTOSCOPY WITH STENT PLACEMENT Left 04/22/2019   Procedure: CYSTOSCOPY WITH STENT CHANGE;  Surgeon: Raynelle Bring, MD;  Location: [facility];  Service: Urology;  Laterality: Left;  . FOOT SURGERY Left 06-23-2010   dr Beola Cord   left first and second toes  . INSERTION OF MESH N/A 04/23/2013   Procedure: INSERTION OF MESH;  Surgeon: Adin Hector, MD;  Location: [facility];  Service: General;  Laterality: N/A;  . JOINT REPLACEMENT    . KNEE ARTHROSCOPY Right 04-27-2007  dr Shellia Carwin '@[facility]'   . LAPAROSCOPIC LYSIS OF ADHESIONS N/A 04/23/2013   Procedure: LAPAROSCOPIC LYSIS OF ADHESIONS;  Surgeon: Adin Hector, MD;  Location: [facility];  Service: General;  Laterality: N/A;  . [place] and 1999  . POSTERIOR LUMBAR FUSION  12/17/2012   L3 -- L5 laminectomy and L3 -- 5 fusion  . TOTAL HIP ARTHROPLASTY Left 07/04/2014   Procedure: LEFT TOTAL HIP ARTHROPLASTY ANTERIOR APPROACH;  Surgeon: Gearlean Alf, MD;  Location: [place];  Service: Orthopedics;  Laterality: Left;  . TOTAL HIP ARTHROPLASTY Right 2009  . TOTAL KNEE ARTHROPLASTY Right 09-15-2009   dr  Shellia Carwin '@[facility]'   . VENTRAL HERNIA REPAIR N/A 07/19/2012   Procedure: LAPAROSCOPIC LYSIS OF ADHESIONS, SMALL BOWEL RESECTION, SEROSAL REPAIR, PRIMARY VENTRAL HERNIA REPAIR;  Surgeon: Adin Hector, MD;  Location: [facility];  Service: General;  Laterality: N/A;  . VENTRAL HERNIA REPAIR N/A 04/23/2013   Procedure: LAPAROSCOPIC exploration and repair of hernia in abdominal VENTRAL wall  HERNIA;  Surgeon: Adin Hector, MD;  Location: [facility];  Service: General;  Laterality: N/A;    Family History  Problem Relation Age of Onset  . Diabetes Mother   . Lung cancer Mother   . Diabetes Father   . Liver cancer Father   . Colon cancer Sister   . Diabetes Sister   . Seizures Daughter   . Diabetes Son   . Diabetes Sister   . Hypertension Sister   . Kidney disease Sister   .  Breast cancer Neg Hx   . Allergic rhinitis Neg Hx   . Angioedema Neg Hx   . Asthma Neg Hx   . Atopy Neg Hx   . Eczema Neg Hx   . Immunodeficiency Neg Hx   . Urticaria Neg Hx   . Colon polyps Neg Hx   . Esophageal cancer Neg Hx   . Rectal cancer Neg Hx   . Stomach cancer Neg Hx     New complaints: None today  Social history: Live with on and daughter in law      Review of Systems  Constitutional: Negative for diaphoresis.  Eyes: Negative for pain.  Respiratory: Negative for shortness of breath.   Cardiovascular: Negative for chest pain, palpitations and leg swelling.  Gastrointestinal: Negative for abdominal pain.  Endocrine: Negative for polydipsia.  Musculoskeletal: Positive for back pain.  Skin: Negative for rash.  Neurological: Negative for dizziness, weakness and headaches.  Hematological: Does not bruise/bleed easily.  All other systems reviewed and are negative.      Objective:   Physical Exam Vitals and nursing note reviewed.  Constitutional:      General: [name] is not in acute distress.    Appearance: Normal appearance. [name] is well-developed.  HENT:     Head: Normocephalic.     Nose: Nose  normal.  Eyes:     Pupils: Pupils are equal, round, and reactive to light.  Neck:     Vascular: No carotid bruit or JVD.  Cardiovascular:     Rate and Rhythm: Normal rate and regular rhythm.     Heart sounds: Normal heart sounds.  Pulmonary:     Effort: Pulmonary effort is normal. No respiratory distress.     Breath sounds: Normal breath sounds. No wheezing or rales.  Chest:     Chest wall: No tenderness.  Abdominal:     General: Bowel sounds are normal. There is no distension or abdominal bruit.     Palpations: Abdomen is soft. There is no hepatomegaly, splenomegaly, mass or pulsatile mass.     Tenderness: There is no abdominal tenderness.  Musculoskeletal:        General: Normal range of motion.     Cervical back: Normal range of motion and neck supple.     Comments: Ambulating with cane Decrease ROM of lumbar pine due to pan on flexion and extenion (-) SLR bil  Lymphadenopathy:     Cervical: No cervical adenopathy.  Skin:    General: Skin is warm and dry.  Neurological:     Mental Status: [name] is alert and oriented to person, place, and time.     Deep Tendon Reflexes: Reflexes are normal and symmetric.  Psychiatric:        Behavior: Behavior normal.        Thought Content: Thought content normal.        Judgment: Judgment normal.    Blood pressure (!) 115/59, pulse (!) 59, temperature (!) 97.3 F (36.3 C), temperature source Temporal, resp. rate 20, height 5' (1.524 m), weight 195 lb (88.5 kg), SpO2 97 %.       Assessment & Plan:  BERNARDINE LANGWORTHY comes in today with chief complaint of Medical Management of Chronic Issues   Diagnosis and orders addressed:  1. Essential hypertension Low odium diet - lisinopril-hydrochlorothiazide (ZESTORETIC) 10-12.5 MG tablet; Take 1 tablet by mouth daily.  Dispense: 90 tablet; Refill: 1 - CBC with Differential/Platelet - CMP14+EGFR  2. Mixed hyperlipidemia Low fat diet - Lipid panel  3. Recurrent major depressive  disorder, in partial remission ([place]) stress management  4. Peripheral polyneuropathy Do not go barefooted Check feet daily for any lesions  5. Class 2 severe obesity with serious comorbidity and body mass index (BMI) of 35.0 to 35.9 in adult, unspecified obesity type ([place]) Discussed diet and exercise for person with BMI >25 Will recheck weight in 3-6 months  6. Chronic midline low back pain with sciatica, sciatica laterality unspecified Moist heat rest - oxyCODONE-acetaminophen (PERCOCET) 7.5-325 MG tablet; Take 1 tablet by mouth every 8 (eight) hours as needed for severe pain.  Dispense: 90 tablet; Refill: 0 - oxyCODONE-acetaminophen (PERCOCET) 7.5-325 MG tablet; Take 1 tablet by mouth every 8 (eight) hours as needed for severe pain.  Dispense: 90 tablet; Refill: 0 - oxyCODONE-acetaminophen (PERCOCET) 7.5-325 MG tablet; Take 1 tablet by mouth every 8 (eight) hours as needed for severe pain.  Dispense: 90 tablet; Refill: 0 - ToxASSURE Select 13 (MW), Urine   Labs pending Health Maintenance reviewed Diet and exercise encouraged  Follow up plan: 3 months   Mary-Margaret Hassell Done, FNP

## 2019-09-20 LAB — CMP14+EGFR
ALT: 11 IU/L (ref 0–32)
AST: 18 IU/L (ref 0–40)
Albumin/Globulin Ratio: 1.6 (ref 1.2–2.2)
Albumin: 4 g/dL (ref 3.7–4.7)
Alkaline Phosphatase: 64 IU/L (ref 39–117)
BUN/Creatinine Ratio: 13 (ref 12–28)
BUN: 11 mg/dL (ref 8–27)
Bilirubin Total: 0.3 mg/dL (ref 0.0–1.2)
CO2: 19 mmol/L — ABNORMAL LOW (ref 20–29)
Calcium: 9.4 mg/dL (ref 8.7–10.3)
Chloride: 104 mmol/L (ref 96–106)
Creatinine, Ser: 0.86 mg/dL (ref 0.57–1.00)
GFR calc Af Amer: 77 mL/min/{1.73_m2} (ref >59)
GFR calc non Af Amer: 67 mL/min/{1.73_m2} (ref >59)
Globulin, Total: 2.5 g/dL (ref 1.5–4.5)
Glucose: 102 mg/dL — ABNORMAL HIGH (ref 65–99)
Potassium: 4.4 mmol/L (ref 3.5–5.2)
Sodium: 141 mmol/L (ref 134–144)
Total Protein: 6.5 g/dL (ref 6.0–8.5)

## 2019-09-20 LAB — CBC WITH DIFFERENTIAL/PLATELET
Basophils Absolute: 0.1 10*3/uL (ref 0.0–0.2)
Basos: 1 %
EOS (ABSOLUTE): 0.5 10*3/uL — ABNORMAL HIGH (ref 0.0–0.4)
Eos: 7 %
Hematocrit: 34.2 % (ref 34.0–46.6)
Hemoglobin: 11.3 g/dL (ref 11.1–15.9)
Immature Grans (Abs): 0 10*3/uL (ref 0.0–0.1)
Immature Granulocytes: 1 %
Lymphocytes Absolute: 2.1 10*3/uL (ref 0.7–3.1)
Lymphs: 30 %
MCH: 31.8 pg (ref 26.6–33.0)
MCHC: 33 g/dL (ref 31.5–35.7)
MCV: 96 fL (ref 79–97)
Monocytes Absolute: 0.5 10*3/uL (ref 0.1–0.9)
Monocytes: 8 %
Neutrophils Absolute: 3.8 10*3/uL (ref 1.4–7.0)
Neutrophils: 53 %
Platelets: 368 10*3/uL (ref 150–450)
RBC: 3.55 x10E6/uL — ABNORMAL LOW (ref 3.77–5.28)
RDW: 12.3 % (ref 11.7–15.4)
WBC: 7.1 10*3/uL (ref 3.4–10.8)

## 2019-09-20 LAB — LIPID PANEL
Chol/HDL Ratio: 3.2 ratio (ref 0.0–4.4)
Cholesterol, Total: 165 mg/dL (ref 100–199)
HDL: 51 mg/dL (ref >39)
LDL Chol Calc (NIH): 95 mg/dL (ref 0–99)
Triglycerides: 107 mg/dL (ref 0–149)
VLDL Cholesterol Cal: 19 mg/dL (ref 5–40)

## 2019-09-24 LAB — TOXASSURE SELECT 13 (MW), URINE

## 2019-09-24 NOTE — Patient Instructions (Signed)
DUE TO COVID-19 ONLY ONE VISITOR IS ALLOWED TO COME WITH YOU AND STAY IN THE WAITING ROOM ONLY DURING PRE OP AND PROCEDURE DAY OF SURGERY. THE 1 VISITOR MAY VISIT WITH YOU AFTER SURGERY IN YOUR PRIVATE ROOM DURING VISITING HOURS ONLY!  YOU NEED TO HAVE A COVID 19 TEST ON_5/24______ @_12 :25______, THIS TEST MUST BE DONE BEFORE SURGERY, COME  [address], [place] , [address].  ([place]) ONCE YOUR COVID TEST IS COMPLETED, PLEASE BEGIN THE QUARANTINE INSTRUCTIONS AS OUTLINED IN YOUR HANDOUT.                [name]    Your procedure is scheduled on: 10/03/19   Report to [facility] Main  Entrance   Report to admitting at  12:00  PM     Call this number if you have problems the morning of surgery [phone]    Remember: Do not eat food or drink liquids :After Midnight.   BRUSH YOUR TEETH MORNING OF SURGERY AND RINSE YOUR MOUTH OUT, NO CHEWING GUM CANDY OR MINTS.     Take these medicines the morning of surgery with A SIP OF WATER: Gabapentin, Elivil                                 You may not have any metal on your body including hair pins and              piercings  Do not wear jewelry, make-up, lotions, powders or perfumes, deodorant             Do not wear nail polish on your fingernails.  Do not shave  48 hours prior to surgery.     Do not bring valuables to the hospital. [place].  Contacts, dentures or bridgework may not be worn into surgery.      Patients discharged the day of surgery will not be allowed to drive home.   IF YOU ARE HAVING SURGERY AND GOING HOME THE SAME DAY, YOU MUST HAVE AN ADULT TO DRIVE YOU HOME AND BE WITH YOU FOR 24 HOURS.   YOU MAY GO HOME BY TAXI OR UBER OR ORTHERWISE, BUT AN ADULT MUST ACCOMPANY YOU HOME AND STAY WITH YOU FOR 24 HOURS.  Name and phone number of your driver:  Special Instructions: N/A              Please read over the following fact  sheets you were given: _____________________________________________________________________             [facility] - Preparing for Surgery  Before surgery, you can play an important role.   Because skin is not sterile, your skin needs to be as free of germs as possible .  You can reduce the number of germs on your skin by washing with CHG (chlorahexidine gluconate) soap before surgery.   CHG is an antiseptic cleaner which kills germs and bonds with the skin to continue killing germs even after washing. Please DO NOT use if you have an allergy to CHG or antibacterial soaps.   If your skin becomes reddened/irritated stop using the CHG and inform your nurse when you arrive at Short Stay. Do not shave (including legs and underarms) for at least 48 hours prior to the first CHG shower.  Please follow these instructions carefully:  1.  Shower with CHG Soap the night before surgery and the  morning of Surgery.  2.  If you choose to wash your hair, wash your hair first as usual with your  normal  shampoo.  3.  After you shampoo, rinse your hair and body thoroughly to remove the  shampoo.                                        4.  Use CHG as you would any other liquid soap.  You can apply chg directly  to the skin and wash                       Gently with a scrungie or clean washcloth.  5.  Apply the CHG Soap to your body ONLY FROM THE NECK DOWN.   Do not use on face/ open                           Wound or open sores. Avoid contact with eyes, ears mouth and genitals (private parts).                       Wash face,  Genitals (private parts) with your normal soap.             6.  Wash thoroughly, paying special attention to the area where your surgery  will be performed.  7.  Thoroughly rinse your body with warm water from the neck down.  8.  DO NOT shower/wash with your normal soap after using and rinsing off  the CHG Soap.             9.  Pat yourself dry with a clean towel.            10.   Wear clean pajamas.            11.  Place clean sheets on your bed the night of your first shower and do not  sleep with pets. Day of Surgery : Do not apply any lotions/deodorants the morning of surgery.  Please wear clean clothes to the hospital/surgery center.  FAILURE TO FOLLOW THESE INSTRUCTIONS MAY RESULT IN THE CANCELLATION OF YOUR SURGERY PATIENT SIGNATURE_________________________________  NURSE SIGNATURE__________________________________  ________________________________________________________________________

## 2019-09-25 ENCOUNTER — Encounter (HOSPITAL_COMMUNITY): Payer: Self-pay

## 2019-09-25 ENCOUNTER — Encounter (HOSPITAL_COMMUNITY)
Admission: RE | Admit: 2019-09-25 | Discharge: 2019-09-25 | Disposition: A | Discharge status: Home / Self Care | Payer: Medicare HMO | Source: Ambulatory Visit | Attending: Urology | Admitting: Urology

## 2019-09-25 ENCOUNTER — Other Ambulatory Visit: Payer: Self-pay

## 2019-09-25 DIAGNOSIS — Z01812 Encounter for preprocedural laboratory examination: Secondary | ICD-10-CM | POA: Insufficient documentation

## 2019-09-25 LAB — BASIC METABOLIC PANEL
Anion gap: 8 (ref 5–15)
BUN: 12 mg/dL (ref 8–23)
CO2: 24 mmol/L (ref 22–32)
Calcium: 8.8 mg/dL — ABNORMAL LOW (ref 8.9–10.3)
Chloride: 99 mmol/L (ref 98–111)
Creatinine, Ser: 0.86 mg/dL (ref 0.44–1.00)
GFR calc Af Amer: >60 mL/min (ref >60)
GFR calc non Af Amer: >60 mL/min (ref >60)
Glucose, Bld: 111 mg/dL — ABNORMAL HIGH (ref 70–99)
Potassium: 3.6 mmol/L (ref 3.5–5.1)
Sodium: 131 mmol/L — ABNORMAL LOW (ref 135–145)

## 2019-09-25 LAB — CBC
HCT: 36.5 % (ref 36.0–46.0)
Hemoglobin: 11.8 g/dL — ABNORMAL LOW (ref 12.0–15.0)
MCH: 31.6 pg (ref 26.0–34.0)
MCHC: 32.3 g/dL (ref 30.0–36.0)
MCV: 97.6 fL (ref 80.0–100.0)
Platelets: 323 10*3/uL (ref 150–400)
RBC: 3.74 MIL/uL — ABNORMAL LOW (ref 3.87–5.11)
RDW: 12.7 % (ref 11.5–15.5)
WBC: 8.9 10*3/uL (ref 4.0–10.5)
nRBC: 0 % (ref 0.0–0.2)

## 2019-09-25 NOTE — Progress Notes (Signed)
PCP - Ronnald Collum FNP Cardiologist - no  Chest x-ray - 2019EKG -  Stress Test - no ECHO - no Cardiac Cath -no   Sleep Study - no CPAP -   Fasting Blood Sugar - NA Checks Blood Sugar _____ times a day  Blood Thinner Instructions:NA Aspirin Instructions: Last Dose:  Anesthesia review:   Patient denies shortness of breath, fever, cough and chest pain at PAT appointment yes  Patient verbalized understanding of instructions that were given to them at the PAT appointment. Patient was also instructed that they will need to review over the PAT instructions again at home before surgery. yes

## 2019-09-30 ENCOUNTER — Other Ambulatory Visit (HOSPITAL_COMMUNITY)
Admission: RE | Admit: 2019-09-30 | Discharge: 2019-09-30 | Disposition: A | Discharge status: Home / Self Care | Payer: Medicare HMO | Source: Ambulatory Visit | Attending: Urology | Admitting: Urology

## 2019-09-30 DIAGNOSIS — Z01812 Encounter for preprocedural laboratory examination: Secondary | ICD-10-CM | POA: Insufficient documentation

## 2019-09-30 DIAGNOSIS — Z20822 Contact with and (suspected) exposure to covid-19: Secondary | ICD-10-CM | POA: Diagnosis not present

## 2019-09-30 LAB — SARS CORONAVIRUS 2 (TAT 6-24 HRS): SARS Coronavirus 2: NEGATIVE

## 2019-10-02 ENCOUNTER — Encounter (HOSPITAL_COMMUNITY): Payer: Self-pay | Admitting: Urology

## 2019-10-02 NOTE — H&P (Signed)
Office Visit Report     09/04/2019   --------------------------------------------------------------------------------   [name]  MRN: [id number]  DOB: 1945-10-14, 74 year old Female  SSN: [id number]   PRIMARY CARE:  Chipper Herb, MD  REFERRING:  Raynelle Bring, Eduardo Osier  PROVIDER:  Raynelle Bring, M.D.  LOCATION:  [facility] [phone]     --------------------------------------------------------------------------------   CC/HPI: Left ureteral stricture/obstruction with history of recurrent left pyelonephritis   She returns today for further follow-up of her left ureteral obstruction. She recently underwent robot assisted laparoscopic surgery for colon resection for a adenomatous polyp. Fortunately, no malignancy was identified. She has recovered well from this. She did have an infection in early March that was treated with antibiotic therapy. She has been off prophylactic antibiotics however and has had no other infections since her last stent change.     ALLERGIES: Ondansetron HCl TABS Sulfa Drugs    MEDICATIONS: Allegra Allergy  Amitriptyline Hcl 25 mg tablet Oral  Lisinopril 20 MG Oral Tablet Oral  Neurontin 300 MG Oral Capsule Oral  Uristat     GU PSH: Catheterization For Collection Of Specimen, Single Patient, All Places Of Service - 2019 Cysto Uretero Balloon Dil Strict - 2010 Cystoscopy Insert Stent, Left - 04/22/2019, Left - 12/03/2018, Left - 07/26/2018, Left - 03/29/2018, Left - 12/07/2017, 2010, 2010 Omental Flap; Intra-abdom - 2010 Release Ureter - 2010 Sling - 2010       PSH Notes: Total Hip Replacement, Ureterolysis, Abdominal Surgery Omental Flap, Intra-abdominal, Cystourethroscopy W/ Ureteroscopy W/ Tx Of Ureteral Strict, Cystoscopy With Insertion Of Ureteral Stent Left, Mid-Urethral Sling Operation, Cystoscopy With Insertion Of Ureteral Stent Left, Back Surgery   NON-GU PSH: COLONOSCOPY FLX W/ENDOSCOPIC MUCOSAL RESECTION Total Hip  Replacement - 2016     GU PMH: Flank Pain - 2019 Acute Cystitis/UTI - 2018 Chronic cystitis (w/o hematuria), Chronic cystitis - 2016 Ureteral stricture, Ureteral obstruction - 2016, Ureteral stricture, - 2014 Dysuria, Dysuria - 2016 Urinary Tract Inf, Unspec site, Pyuria - 2016, Urinary tract infection, - 2016 Urinary Urgency, Urinary urgency - 2016 Pyelonephritis, Pyelonephritis, acute - 2014      PMH Notes:   1) Left hydronephrosis and ureteral obstruction secondary to ureteral stricture: She is s/p a left anterior lumbar decompression and arthrodesis in September 2009. This was performed via an anterior retroperitoneal approach. She presented to the emergency department in October 2009 with left lower quadrant pain, dysuria, and fever. A CT scan demonstrated a left lower quadrant fluid collection with left hydronephrosis. She was admitted to the hospital and was seen in consultation by Dr. Jeffie Pollock. A left percutaneous nephrostomy tube was placed. CT guided aspiration of the fluid collection was also performed and the fluid was consistent with a probable lymphocele. An antegrade stent was placed which was then removed in November 2009. She did well until May 2010 when she again presented with flank pain and fever and a renal ultrasound demonstrated left hydronephrosis. She was taken to the OR by Dr. Jeffie Pollock and a retrograde pyelogram showed a long proximal narrowing from the level of the left UPJ to the level of the bifurcation of the aorta. A stent was again placed which was removed in June 2010. I initially evaluated her in July 2010 and she was asymptomatic and she was therefore observed. However, she again developed left flank pain in August 2010 and was taken to the OR for further evaluation. She was found to have a short ureteral stricture at the  level of L4 likely a result of an ischemic injury from her prior surgery. This was treated at that time with balloon dilation and stent placement. She  again developed pyelonephritis and elected to proceed with definitive surgical repair. She underwent exploration on 03/11/09 and was found to have retroperitoneal fibrosis as the cause of her ureteral obstruction and likely related to her prior surgery. Her ureter was very difficult to remove from the surrounding fibrosis but she was able to undergo definitive ureterolysis and omental wrap. She was noted to have declining relative renal function of her left kidney in March 2015 (25%). After discussing her options, the complexity of her surgical history, and the fact her global renal function remained stable, she elected to proceed with observation rather than intervention.   Mar 2019: Left pyelonephritis - E. coli  Jun 2019: Left pyelonephritis - E. coli resistant to fluorquinolones, TMP/SMX, and tetracycline (sensitive to cephalexin)  Aug 2019: Left ureteral stent placement (initial) - 6 x 03 Apr 2018: Left ureteral stent - 6 x 31 Jul 2018: Left ureteral stent - 6 x 30 Nov 2018: L ureteral stent - 6 x 02 May 2019: L ureteral stent - 6x 24   2) Stress incontinence: She is s/p a urethral sling by Dr. Jeffie Pollock in 2005.   3) Recurrent UTIs: She has previously been on prophylaxis with nitrofurantoin but was able to discontinue this therapy in 2013. She developed recurrent pyelonephritis in 2017.       NON-GU PMH: Pyuria/other UA findings (Stable) - 06/19/2018, - 2018 Hypertension    FAMILY HISTORY: 1 Daughter - Daughter 2 sons - Son Death of family member - Sister, Father, Mother Diabetes - Sister, Son, Father, Mother End Stage Renal Disease - No Family History   SOCIAL HISTORY: Marital Status: Widowed Preferred Language: English; Ethnicity: Not Hispanic Or Latino; Race: White Current Smoking Status: Patient has never smoked.   Tobacco Use Assessment Completed: Used Tobacco in last 30 days? Does not drink anymore.  Drinks 3 caffeinated drinks per day.    REVIEW OF SYSTEMS:    GU  Review Female:   Patient denies currently pregnant, frequent urination, trouble starting your stream, hard to postpone urination, leakage of urine, stream starts and stops, burning /pain with urination, have to strain to urinate, and get up at night to urinate.  Gastrointestinal (Lower):   Patient denies diarrhea and constipation.  Gastrointestinal (Upper):   Patient denies nausea and vomiting.  Constitutional:   Patient denies fever, night sweats, weight loss, and fatigue.  Skin:   Patient denies skin rash/ lesion and itching.  Eyes:   Patient denies blurred vision and double vision.  Ears/ Nose/ Throat:   Patient denies sore throat and sinus problems.  Hematologic/Lymphatic:   Patient denies swollen glands and easy bruising.  Cardiovascular:   Patient denies leg swelling and chest pains.  Respiratory:   Patient denies cough and shortness of breath.  Endocrine:   Patient denies excessive thirst.  Musculoskeletal:   Patient denies back pain and joint pain.  Neurological:   Patient denies headaches and dizziness.  Psychologic:   Patient denies depression and anxiety.   VITAL SIGNS:      09/04/2019 12:48 PM  Weight 200 lb / 90.72 kg  Height 60 in / 152.4 cm  BP 117/69 mmHg  Pulse 93 /min  Temperature 97.1 F / 36.1 C  BMI 39.1 kg/m   MULTI-SYSTEM PHYSICAL EXAMINATION:    Constitutional: Well-nourished. No physical deformities.  Normally developed. Good grooming.  Respiratory: No labored breathing, no use of accessory muscles. Clear bilaterally.   Cardiovascular: Normal temperature, normal extremity pulses, no swelling, no varicosities. Regular rate and rhythm.     Complexity of Data:  Records Review:   Previous Patient Records   PROCEDURES:          Urinalysis w/Scope Dipstick Dipstick Cont'd Micro  Color: Yellow Bilirubin: Neg mg/dL WBC/hpf: >60/hpf  Appearance: Cloudy Ketones: Neg mg/dL RBC/hpf: NS (Not Seen)  Specific Gravity: 1.015 Blood: Neg ery/uL Bacteria: Few (10-25/hpf)   pH: 6.0 Protein: Trace mg/dL Cystals: NS (Not Seen)  Glucose: Neg mg/dL Urobilinogen: 0.2 mg/dL Casts: NS (Not Seen)    Nitrites: Neg Trichomonas: Not Present    Leukocyte Esterase: 3+ leu/uL Mucous: Present      Epithelial Cells: 0 - 5/hpf      Yeast: NS (Not Seen)      Sperm: Not Present    ASSESSMENT:      ICD-10 Details  1 GU:   Ureteral stricture - N13.5   2 NON-GU:   Bacteriuria - R82.71    PLAN:           Orders Labs Urine Culture          Schedule Return Visit/Planned Activity: Other See Visit Notes             Note: Will call to schedule surgery.          Document Letter(s):  Created for Patient: Clinical Summary         Notes:   1. Left ureteral stricture/obstruction: She will be scheduled for cystoscopy and left ureteral stent change in May. We have reviewed this procedure in detail including the potential risks, complications, and the expected recovery process. She gives informed consent to proceed. Her urine has been cultured today and she will be treated appropriately with perioperative antibiotics as indicated.   2. Recurrent UTIs: Her urine has been cultured today. She will remain off prophylaxis for now.   Cc: Dr. Redge Gainer    * Signed by Raynelle Bring, M.D. on 09/04/19 at 5:35 PM (EDT)*

## 2019-10-02 NOTE — Anesthesia Preprocedure Evaluation (Addendum)
Anesthesia Evaluation  Patient identified by MRN, date of birth, ID band Patient awake    Reviewed: Allergy & Precautions, NPO status , Patient's Chart, lab work & pertinent test results  History of Anesthesia Complications Negative for: history of anesthetic complications  Airway Mallampati: I  TM Distance: >3 FB Neck ROM: Full    Dental no notable dental hx. (+) Edentulous Upper, Edentulous Lower   Pulmonary neg pulmonary ROS,    Pulmonary exam normal        Cardiovascular hypertension, Pt. on medications Normal cardiovascular exam     Neuro/Psych Depression Chronic low back pain, uses cane    GI/Hepatic Neg liver ROS, Hx of SBO s/p bowel resection   Endo/Other  negative endocrine ROS  Renal/GU Recurrent ureteral obstruction 2/2 retroperitoneal fibrosis  negative genitourinary   Musculoskeletal  (+) Arthritis , narcotic dependent  Abdominal   Peds  Hematology negative hematology ROS (+) anemia , Hgb 11.8   Anesthesia Other Findings Day of surgery medications reviewed with patient.  Reproductive/Obstetrics negative OB ROS                            Anesthesia Physical Anesthesia Plan  ASA: II  Anesthesia Plan: General   Post-op Pain Management:    Induction: Intravenous  PONV Risk Score and Plan: 3 and Treatment may vary due to age or medical condition, Ondansetron and Dexamethasone  Airway Management Planned: LMA  Additional Equipment: None  Intra-op Plan:   Post-operative Plan: Extubation in OR  Informed Consent: I have reviewed the patients History and Physical, chart, labs and discussed the procedure including the risks, benefits and alternatives for the proposed anesthesia with the patient or authorized representative who has indicated his/her understanding and acceptance.     Dental advisory given  Plan Discussed with: CRNA  Anesthesia Plan Comments:         Anesthesia Quick Evaluation

## 2019-10-03 ENCOUNTER — Ambulatory Visit (HOSPITAL_COMMUNITY)
Admission: RE | Admit: 2019-10-03 | Discharge: 2019-10-03 | Disposition: A | Discharge status: Home / Self Care | Payer: Medicare HMO | Attending: Urology | Admitting: Urology

## 2019-10-03 ENCOUNTER — Ambulatory Visit (HOSPITAL_COMMUNITY): Payer: Medicare HMO | Admitting: Anesthesiology

## 2019-10-03 ENCOUNTER — Ambulatory Visit (HOSPITAL_COMMUNITY): Payer: Medicare HMO

## 2019-10-03 ENCOUNTER — Encounter (HOSPITAL_COMMUNITY)
Admission: RE | Disposition: A | Discharge status: Home / Self Care | Payer: Self-pay | Source: Home / Self Care | Attending: Urology

## 2019-10-03 DIAGNOSIS — N135 Crossing vessel and stricture of ureter without hydronephrosis: Secondary | ICD-10-CM | POA: Insufficient documentation

## 2019-10-03 DIAGNOSIS — I10 Essential (primary) hypertension: Secondary | ICD-10-CM | POA: Diagnosis not present

## 2019-10-03 DIAGNOSIS — Z466 Encounter for fitting and adjustment of urinary device: Secondary | ICD-10-CM | POA: Insufficient documentation

## 2019-10-03 DIAGNOSIS — M545 Low back pain: Secondary | ICD-10-CM | POA: Insufficient documentation

## 2019-10-03 DIAGNOSIS — Z79899 Other long term (current) drug therapy: Secondary | ICD-10-CM | POA: Insufficient documentation

## 2019-10-03 DIAGNOSIS — E785 Hyperlipidemia, unspecified: Secondary | ICD-10-CM | POA: Diagnosis not present

## 2019-10-03 DIAGNOSIS — Z87440 Personal history of urinary (tract) infections: Secondary | ICD-10-CM | POA: Insufficient documentation

## 2019-10-03 DIAGNOSIS — Z981 Arthrodesis status: Secondary | ICD-10-CM | POA: Diagnosis not present

## 2019-10-03 DIAGNOSIS — G8929 Other chronic pain: Secondary | ICD-10-CM | POA: Insufficient documentation

## 2019-10-03 DIAGNOSIS — Z96649 Presence of unspecified artificial hip joint: Secondary | ICD-10-CM | POA: Insufficient documentation

## 2019-10-03 DIAGNOSIS — Z882 Allergy status to sulfonamides status: Secondary | ICD-10-CM | POA: Diagnosis not present

## 2019-10-03 DIAGNOSIS — R69 Illness, unspecified: Secondary | ICD-10-CM | POA: Diagnosis not present

## 2019-10-03 DIAGNOSIS — F329 Major depressive disorder, single episode, unspecified: Secondary | ICD-10-CM | POA: Insufficient documentation

## 2019-10-03 DIAGNOSIS — D649 Anemia, unspecified: Secondary | ICD-10-CM | POA: Diagnosis not present

## 2019-10-03 HISTORY — PX: CYSTOSCOPY WITH STENT PLACEMENT: SHX5790

## 2019-10-03 SURGERY — CYSTOSCOPY, WITH STENT INSERTION
Anesthesia: General | Laterality: Left

## 2019-10-03 MED ORDER — EPHEDRINE 5 MG/ML INJ
INTRAVENOUS | Status: AC
  Filled 2019-10-03: qty 10

## 2019-10-03 MED ORDER — LACTATED RINGERS IV SOLN: INTRAVENOUS | Status: DC

## 2019-10-03 MED ORDER — EPHEDRINE SULFATE-NACL 50-0.9 MG/10ML-% IV SOSY
PREFILLED_SYRINGE | INTRAVENOUS | Status: DC | PRN
  Administered 2019-10-03: 10 mg via INTRAVENOUS

## 2019-10-03 MED ORDER — LIDOCAINE 2% (20 MG/ML) 5 ML SYRINGE
INTRAMUSCULAR | Status: AC
  Filled 2019-10-03: qty 5

## 2019-10-03 MED ORDER — FENTANYL CITRATE (PF) 100 MCG/2ML IJ SOLN
INTRAMUSCULAR | Status: DC | PRN
  Administered 2019-10-03: 25 ug via INTRAVENOUS

## 2019-10-03 MED ORDER — SODIUM CHLORIDE 0.9 % IR SOLN
Status: DC | PRN
  Administered 2019-10-03: 1000 mL

## 2019-10-03 MED ORDER — 0.9 % SODIUM CHLORIDE (POUR BTL) OPTIME
TOPICAL | Status: DC | PRN
  Administered 2019-10-03: 1000 mL

## 2019-10-03 MED ORDER — LIDOCAINE 2% (20 MG/ML) 5 ML SYRINGE
INTRAMUSCULAR | Status: DC | PRN
  Administered 2019-10-03: 100 mg via INTRAVENOUS

## 2019-10-03 MED ORDER — OXYCODONE HCL 5 MG/5ML PO SOLN: 5.0000 mg | Freq: Once | ORAL | Status: DC | PRN

## 2019-10-03 MED ORDER — PROMETHAZINE HCL 25 MG/ML IJ SOLN: 6.2500 mg | INTRAMUSCULAR | Status: DC | PRN

## 2019-10-03 MED ORDER — VANCOMYCIN HCL IN DEXTROSE 1-5 GM/200ML-% IV SOLN
1000.0000 mg | Freq: Once | INTRAVENOUS | Status: AC
  Administered 2019-10-03: 1000 mg via INTRAVENOUS
  Filled 2019-10-03: qty 200

## 2019-10-03 MED ORDER — FENTANYL CITRATE (PF) 100 MCG/2ML IJ SOLN: 25.0000 ug | INTRAMUSCULAR | Status: DC | PRN

## 2019-10-03 MED ORDER — FENTANYL CITRATE (PF) 100 MCG/2ML IJ SOLN
INTRAMUSCULAR | Status: AC
  Filled 2019-10-03: qty 2

## 2019-10-03 MED ORDER — PROPOFOL 10 MG/ML IV BOLUS
INTRAVENOUS | Status: DC | PRN
  Administered 2019-10-03: 170 mg via INTRAVENOUS

## 2019-10-03 MED ORDER — DEXAMETHASONE SODIUM PHOSPHATE 10 MG/ML IJ SOLN
INTRAMUSCULAR | Status: DC | PRN
  Administered 2019-10-03: 4 mg via INTRAVENOUS

## 2019-10-03 MED ORDER — ONDANSETRON HCL 4 MG/2ML IJ SOLN
INTRAMUSCULAR | Status: AC
  Filled 2019-10-03: qty 2

## 2019-10-03 MED ORDER — PROPOFOL 10 MG/ML IV BOLUS
INTRAVENOUS | Status: AC
  Filled 2019-10-03: qty 20

## 2019-10-03 MED ORDER — OXYCODONE HCL 5 MG PO TABS: 5.0000 mg | ORAL_TABLET | Freq: Once | ORAL | Status: DC | PRN

## 2019-10-03 MED ORDER — ACETAMINOPHEN 500 MG PO TABS
1000.0000 mg | ORAL_TABLET | Freq: Once | ORAL | Status: DC
  Filled 2019-10-03: qty 2

## 2019-10-03 SURGICAL SUPPLY — 13 items
BAG URO CATCHER STRL LF (MISCELLANEOUS) ×2 IMPLANT
CATH INTERMIT  6FR 70CM (CATHETERS) ×2 IMPLANT
CLOTH BEACON ORANGE TIMEOUT ST (SAFETY) ×2 IMPLANT
GAUZE 4X4 16PLY RFD (DISPOSABLE) ×1 IMPLANT
GLOVE BIOGEL M STRL SZ7.5 (GLOVE) ×2 IMPLANT
GOWN STRL REUS W/TWL LRG LVL3 (GOWN DISPOSABLE) ×4 IMPLANT
GUIDEWIRE STR DUAL SENSOR (WIRE) ×2 IMPLANT
KIT TURNOVER KIT A (KITS) IMPLANT
MANIFOLD NEPTUNE II (INSTRUMENTS) ×2 IMPLANT
STENT URET 6FRX24 CONTOUR (STENTS) ×1 IMPLANT
TRAY CYSTO PACK (CUSTOM PROCEDURE TRAY) ×2 IMPLANT
TUBING CONNECTING 10 (TUBING) ×2 IMPLANT
TUBING UROLOGY SET (TUBING) IMPLANT

## 2019-10-03 NOTE — Anesthesia Procedure Notes (Signed)
Procedure Name: LMA Insertion Date/Time: 10/03/2019 3:12 PM Performed by: Niel Hummer, CRNA Pre-anesthesia Checklist: Patient identified, Emergency Drugs available, Suction available and Patient being monitored Patient Re-evaluated:Patient Re-evaluated prior to induction Oxygen Delivery Method: Circle system utilized Preoxygenation: Pre-oxygenation with 100% oxygen Induction Type: IV induction LMA: LMA with gastric port inserted LMA Size: 4.0 Number of attempts: 1 Dental Injury: Teeth and Oropharynx as per pre-operative assessment

## 2019-10-03 NOTE — Interval H&P Note (Signed)
History and Physical Interval Note:  10/03/2019 1:51 PM  [name] Fallen  has presented today for surgery, with the diagnosis of LEFT URETERAL OBSTRUCTION.  The various methods of treatment have been discussed with the patient and family. After consideration of risks, benefits and other options for treatment, the patient has consented to  Procedure(s): CYSTOSCOPY WITH STENT CHANGE (Left) as a surgical intervention.  The patient's history has been reviewed, patient examined, no change in status, stable for surgery.  I have reviewed the patient's chart and labs.  Questions were answered to the patient's satisfaction.     [employer]

## 2019-10-03 NOTE — Progress Notes (Signed)
See paper chart for pre-op assessment. This occurred during downtime. Completed by Kyra Leyland, RN

## 2019-10-03 NOTE — Op Note (Signed)
Preoperative diagnosis:  1. Left ureteral obstruction   Postoperative diagnosis:  1. Left ureteral obstruction   Procedure:  1. Cystoscopy 2. Left ureteral stent placement (6 x 24 - no string)  Surgeon: Roxy Horseman, Brooke Bonito. M.D.  Anesthesia: General  Complications: None  Intraoperative findings: Her indwelling stent was severely encrusted.  EBL: Minimal  Specimens: None  Indication: [name] is a 74 y.o. patient with left ureteral obstruction. After reviewing the management options for treatment, he elected to proceed with the above surgical procedure(s). We have discussed the potential benefits and risks of the procedure, side effects of the proposed treatment, the likelihood of the patient achieving the goals of the procedure, and any potential problems that might occur during the procedure or recuperation. Informed consent has been obtained.  Description of procedure:  The patient was taken to the operating room and general anesthesia was induced.  The patient was placed in the dorsal lithotomy position, prepped and draped in the usual sterile fashion, and preoperative antibiotics were administered. A preoperative time-out was performed.   Cystourethroscopy was performed.  The patient's urethra was examined and was unremarkable. The bladder was then systematically examined in its entirety. There was no evidence for any bladder tumors, stones, or other mucosal pathology.    Attention then turned to the left ureteral orifice and the patient's indwelling ureteral stent was identified and brought out to the urethral meatus with the flexible graspers. An attempt was made to pass a wire through the stent but was unsuccessful due to encrustation.  The stent was therefore removed.  A 0.38 sensor guidewire was then advanced up the left ureter into the renal pelvis under fluoroscopic guidance.  The wire was then backloaded through the cystoscope and a ureteral stent was advance  over the wire using Seldinger technique.  The stent was positioned appropriately under fluoroscopic and cystoscopic guidance.  The wire was then removed with an adequate stent curl noted in the renal pelvis as well as in the bladder.  The bladder was then emptied and the procedure ended.  The patient appeared to tolerate the procedure well and without complications.  The patient was able to be awakened and transferred to the recovery unit in satisfactory condition.    Pryor Curia MD

## 2019-10-03 NOTE — Transfer of Care (Signed)
Immediate Anesthesia Transfer of Care Note  Patient: [name]  Procedure(s) Performed: CYSTOSCOPY WITH STENT CHANGE (Left )  Patient Location: PACU  Anesthesia Type:General  Level of Consciousness: awake, alert  and oriented  Airway & Oxygen Therapy: Patient Spontanous Breathing and Patient connected to face mask oxygen  Post-op Assessment: Report given to RN, Post -op Vital signs reviewed and stable and Patient moving all extremities X 4  Post vital signs: Reviewed and stable  Last Vitals:  Vitals Value Taken Time  BP    Temp    Pulse 58 10/03/19 1543  Resp 13 10/03/19 1543  SpO2 100 % 10/03/19 1543  Vitals shown include unvalidated device data.  Last Pain:  Vitals:   10/03/19 1200  TempSrc: Oral         Complications: No apparent anesthesia complications

## 2019-10-03 NOTE — Anesthesia Postprocedure Evaluation (Signed)
Anesthesia Post Note  Patient: [name]  Procedure(s) Performed: CYSTOSCOPY WITH STENT CHANGE (Left )     Patient location during evaluation: PACU Anesthesia Type: General Level of consciousness: awake and alert and oriented Pain management: pain level controlled Vital Signs Assessment: post-procedure vital signs reviewed and stable Respiratory status: spontaneous breathing, nonlabored ventilation and respiratory function stable Cardiovascular status: blood pressure returned to baseline Postop Assessment: no apparent nausea or vomiting Anesthetic complications: no    Last Vitals:  Vitals:   10/03/19 1600 10/03/19 1615  BP: 135/62 123/60  Pulse: (!) 55 (!) 54  Resp: 12 12  Temp:  36.4 C  SpO2: 100% 100%    Last Pain:  Vitals:   10/03/19 1615  TempSrc:   PainSc: 0-No pain                 Brennan Bailey

## 2019-10-03 NOTE — Discharge Instructions (Signed)

## 2019-10-04 ENCOUNTER — Ambulatory Visit: Payer: Medicare HMO | Admitting: Nurse Practitioner

## 2019-10-21 ENCOUNTER — Telehealth: Payer: Self-pay | Admitting: Nurse Practitioner

## 2019-10-21 NOTE — Telephone Encounter (Signed)
Due to pain contract, we are not suppose to send pain meds to a different pharmacy

## 2019-10-21 NOTE — Telephone Encounter (Signed)
[facility] is out of stock and would like Rx to go to [facility]

## 2019-10-21 NOTE — Telephone Encounter (Signed)
Patient aware.

## 2019-10-21 NOTE — Telephone Encounter (Signed)
  Prescription Request  10/21/2019  What is the name of the medication or equipment? Hydrocodone-Generic for percocet  Have you contacted your pharmacy to request a refill? (if applicable) Yes  Which pharmacy would you like this sent to? [facility]   Patient notified that their request is being sent to the clinical staff for review and that they should receive a response within 2 business days.   [facility] is out of her meds.  MMM's pt.  She was suppose to get it on 10-19-2019 but they were out of her meds.

## 2019-10-29 ENCOUNTER — Telehealth: Payer: Self-pay | Admitting: Nurse Practitioner

## 2019-10-29 DIAGNOSIS — M546 Pain in thoracic spine: Secondary | ICD-10-CM

## 2019-10-29 DIAGNOSIS — G8929 Other chronic pain: Secondary | ICD-10-CM

## 2019-10-29 DIAGNOSIS — M5440 Lumbago with sciatica, unspecified side: Secondary | ICD-10-CM

## 2019-10-29 MED ORDER — GABAPENTIN 300 MG PO CAPS: 300.0000 mg | ORAL_CAPSULE | Freq: Three times a day (TID) | ORAL | 2 refills | Status: DC

## 2019-10-29 NOTE — Telephone Encounter (Signed)
Pt aware refill sent to pharmacy 

## 2019-10-29 NOTE — Telephone Encounter (Signed)
  Prescription Request  10/29/2019  What is the name of the medication or equipment? Gabapentin  Have you contacted your pharmacy to request a refill? (if applicable) Yes  Which pharmacy would you like this sent to? [facility]   Patient notified that their request is being sent to the clinical staff for review and that they should receive a response within 2 business days.   MMM's pt  Please call pt.

## 2019-11-01 ENCOUNTER — Other Ambulatory Visit: Payer: Self-pay | Admitting: Nurse Practitioner

## 2019-11-01 DIAGNOSIS — Z1231 Encounter for screening mammogram for malignant neoplasm of breast: Secondary | ICD-10-CM

## 2019-12-02 ENCOUNTER — Other Ambulatory Visit: Payer: Self-pay

## 2019-12-02 ENCOUNTER — Ambulatory Visit
Admission: RE | Admit: 2019-12-02 | Discharge: 2019-12-02 | Disposition: A | Discharge status: Home / Self Care | Payer: Medicare HMO | Source: Ambulatory Visit | Attending: Nurse Practitioner | Admitting: Nurse Practitioner

## 2019-12-02 DIAGNOSIS — Z1231 Encounter for screening mammogram for malignant neoplasm of breast: Secondary | ICD-10-CM | POA: Diagnosis not present

## 2019-12-19 ENCOUNTER — Other Ambulatory Visit: Payer: Self-pay

## 2019-12-19 ENCOUNTER — Ambulatory Visit (INDEPENDENT_AMBULATORY_CARE_PROVIDER_SITE_OTHER): Payer: Medicare HMO | Admitting: Nurse Practitioner

## 2019-12-19 ENCOUNTER — Encounter: Payer: Self-pay | Admitting: Nurse Practitioner

## 2019-12-19 VITALS — BP 123/70 | HR 86 | Temp 97.7°F | Resp 20 | Ht 60.0 in | Wt 200.0 lb

## 2019-12-19 DIAGNOSIS — F3341 Major depressive disorder, recurrent, in partial remission: Secondary | ICD-10-CM

## 2019-12-19 DIAGNOSIS — R609 Edema, unspecified: Secondary | ICD-10-CM | POA: Diagnosis not present

## 2019-12-19 DIAGNOSIS — G8929 Other chronic pain: Secondary | ICD-10-CM

## 2019-12-19 DIAGNOSIS — R69 Illness, unspecified: Secondary | ICD-10-CM | POA: Diagnosis not present

## 2019-12-19 DIAGNOSIS — R600 Localized edema: Secondary | ICD-10-CM

## 2019-12-19 DIAGNOSIS — E669 Obesity, unspecified: Secondary | ICD-10-CM | POA: Diagnosis not present

## 2019-12-19 DIAGNOSIS — I10 Essential (primary) hypertension: Secondary | ICD-10-CM | POA: Diagnosis not present

## 2019-12-19 DIAGNOSIS — M546 Pain in thoracic spine: Secondary | ICD-10-CM

## 2019-12-19 DIAGNOSIS — G629 Polyneuropathy, unspecified: Secondary | ICD-10-CM | POA: Diagnosis not present

## 2019-12-19 DIAGNOSIS — M5440 Lumbago with sciatica, unspecified side: Secondary | ICD-10-CM | POA: Diagnosis not present

## 2019-12-19 LAB — URINALYSIS, COMPLETE
Bilirubin, UA: NEGATIVE
Glucose, UA: NEGATIVE
Ketones, UA: NEGATIVE
Nitrite, UA: POSITIVE — AB
Protein,UA: NEGATIVE
Specific Gravity, UA: 1.01 (ref 1.005–1.030)
Urobilinogen, Ur: 0.2 mg/dL (ref 0.2–1.0)
pH, UA: 6.5 (ref 5.0–7.5)

## 2019-12-19 LAB — MICROSCOPIC EXAMINATION
Epithelial Cells (non renal): NONE SEEN /hpf (ref 0–10)
RBC, Urine: NONE SEEN /hpf (ref 0–2)

## 2019-12-19 MED ORDER — LISINOPRIL 20 MG PO TABS: 20.0000 mg | ORAL_TABLET | Freq: Every day | ORAL | 1 refills | Status: DC

## 2019-12-19 MED ORDER — OXYCODONE-ACETAMINOPHEN 7.5-325 MG PO TABS: 1.0000 | ORAL_TABLET | Freq: Three times a day (TID) | ORAL | 0 refills | Status: DC | PRN

## 2019-12-19 MED ORDER — AMITRIPTYLINE HCL 25 MG PO TABS: 25.0000 mg | ORAL_TABLET | Freq: Every day | ORAL | 1 refills | Status: DC

## 2019-12-19 MED ORDER — FUROSEMIDE 20 MG PO TABS: 20.0000 mg | ORAL_TABLET | Freq: Every day | ORAL | 1 refills | Status: DC

## 2019-12-19 MED ORDER — OXYCODONE-ACETAMINOPHEN 7.5-325 MG PO TABS: 1.0000 | ORAL_TABLET | Freq: Three times a day (TID) | ORAL | 0 refills | Status: AC | PRN

## 2019-12-19 MED ORDER — GABAPENTIN 300 MG PO CAPS: 300.0000 mg | ORAL_CAPSULE | Freq: Three times a day (TID) | ORAL | 2 refills | Status: DC

## 2019-12-19 NOTE — Patient Instructions (Signed)
Peripheral Edema  Peripheral edema is swelling that is caused by a buildup of fluid. Peripheral edema most often affects the lower legs, ankles, and feet. It can also develop in the arms, hands, and face. The area of the body that has peripheral edema will look swollen. It may also feel heavy or warm. Your clothes may start to feel tight. Pressing on the area may make a temporary dent in your skin. You may not be able to move your swollen arm or leg as much as usual. There are many causes of peripheral edema. It can happen because of a complication of other conditions such as congestive heart failure, kidney disease, or a problem with your blood circulation. It also can be a side effect of certain medicines or because of an infection. It often happens to women during pregnancy. Sometimes, the cause is not known. Follow these instructions at home: Managing pain, stiffness, and swelling   Raise (elevate) your legs while you are sitting or lying down.  Move around often to prevent stiffness and to lessen swelling.  Do not sit or stand for long periods of time.  Wear support stockings as told by your health care provider. Medicines  Take over-the-counter and prescription medicines only as told by your health care provider.  Your health care provider may prescribe medicine to help your body get rid of excess water (diuretic). General instructions  Pay attention to any changes in your symptoms.  Follow instructions from your health care provider about limiting salt (sodium) in your diet. Sometimes, eating less salt may reduce swelling.  Moisturize skin daily to help prevent skin from cracking and draining.  Keep all follow-up visits as told by your health care provider. This is important. Contact a health care provider if you have:  A fever.  Edema that starts suddenly or is getting worse, especially if you are pregnant or have a medical condition.  Swelling in only one leg.  Increased  swelling, redness, or pain in one or both of your legs.  Drainage or sores at the area where you have edema. Get help right away if you:  Develop shortness of breath, especially when you are lying down.  Have pain in your chest or abdomen.  Feel weak.  Feel faint. Summary  Peripheral edema is swelling that is caused by a buildup of fluid. Peripheral edema most often affects the lower legs, ankles, and feet.  Move around often to prevent stiffness and to lessen swelling. Do not sit or stand for long periods of time.  Pay attention to any changes in your symptoms.  Contact a health care provider if you have edema that starts suddenly or is getting worse, especially if you are pregnant or have a medical condition.  Get help right away if you develop shortness of breath, especially when lying down. This information is not intended to replace advice given to you by your health care provider. Make sure you discuss any questions you have with your health care provider. Document Revised: 01/17/2018 Document Reviewed: 01/17/2018 Elsevier Patient Education  2020 Elsevier Inc.  

## 2019-12-19 NOTE — Progress Notes (Signed)
Subjective:    Patient ID: [name], female    DOB: 1945-11-29, 74 y.o.   MRN: [id number]   Chief Complaint: Medical Management of Chronic Issues    HPI:  1. Essential hypertension denies chest pain, sob or headaches. She does not check her blood pressure at home. BP Readings from Last 3 Encounters:  12/19/19 123/70  10/03/19 (!) 158/66  09/25/19 (!) 140/56     2. Peripheral polyneuropathy Burning in bottom of both feet. She takes gabapentin 3x a day and that helps  3. Chronic midline thoracic back pain Pain assessment: Cause of pain- DDD Pain location- low back- has had 4 back surgeries Pain on scale of 1-10- 2-10 depending o what she is doing Frequency- daily What increases pain-to much activity What makes pain Better-rest Effects on ADL - none Any change in general medical condition-none  Current opioids rx- percocet 7.5/325 # meds rx- #90 Effectiveness of current meds-does not coempletely relieve pain Adverse reactions form pain meds-none Morphine equivalent- 33.75 MME  Pill count performed-No Last drug screen - 09/19/19 ( high risk q38m moderate risk q630mlow risk yearly ) Urine drug screen today- No Was the N[place]ed- yes  If yes were their any concerning findings? - noo   Overdose risk: 1  Pain contract signed on: 12/28/18   4. Recurrent major depressive disorder, in partial remission ([place] is not on any meds for depression. She says she is doing well. [facility] 12/19/2019 09/19/2019 03/22/2019  Decreased Interest 0 0 0  Down, Depressed, Hopeless 0 0 0  PHQ - 2 Score 0 0 0  Some recent data might be hidden     5. Obesity (BMI 30-39.9) No recent weight changes Wt Readings from Last 3 Encounters:  12/19/19 200 lb (90.7 kg)  10/03/19 192 lb 9 oz (87.3 kg)  09/25/19 192 lb 9 oz (87.3 kg)   BMI Readings from Last 3 Encounters:  12/19/19 39.06 kg/m  10/03/19 37.61 kg/m  09/25/19 37.61 kg/m        Outpatient Encounter Medications as of 12/19/2019  Medication Sig  . amitriptyline (ELAVIL) 25 MG tablet Take 1 tablet (25 mg total) by mouth at bedtime.  . gabapentin (NEURONTIN) 300 MG capsule Take 1 capsule (300 mg total) by mouth 3 (three) times daily.  . Marland Kitchenisinopril-hydrochlorothiazide (ZESTORETIC) 10-12.5 MG tablet Take 1 tablet by mouth daily.  . meloxicam (MOBIC) 7.5 MG tablet Take 1 tablet (7.5 mg total) by mouth daily.  . methocarbamol (ROBAXIN) 500 MG tablet Take 500 mg by mouth at bedtime.   . Sennosides (EX-LAX PO) Take 1 tablet by mouth 2 (two) times a week.      Past Surgical History:  Procedure Laterality Date  . ABDOMINAL HYSTERECTOMY  11/02/10  dr geAlycia Rossetti'@[facility]'    TAH, BSO, incisional hernia repair , lysis of adhesions for endometrial cancer  . ANTERIOR LUMBAR FUSION  01-28-2008   dr elsner  '@[facility]'    L2 -- L4  . BACK SURGERY    . BUNIONECTOMY Right 2003  . CATARACT EXTRACTION W/ INTRAOCULAR LENS  IMPLANT, BILATERAL  2007  . COLONOSCOPY  last one 12-24-2018  . CYSTO/  LEFT RETROGRADE PYELOGRAM/ URETEROSCOPY STENT PLACEMENT/  OPEN URETEROLYSIS WITH OMENTAL FLAP Left 03-11-2009    dr borden  '@[facility]'   . [facility]  AN[facility]IOR REPAIR  12-06-2001    dr wrJeffie Polloc'@[facility]'   . CYSTOSCOPY W/ URETERAL STENT PLACEMENT Left 12/07/2017   Procedure: CYSTOSCOPY WITH RETROGRADE  PYELOGRAM/URETERAL STENT PLACEMENT;  Surgeon: Raynelle Bring, MD;  Location: [facility];  Service: Urology;  Laterality: Left;  . CYSTOSCOPY W/ URETERAL STENT PLACEMENT Left 03/29/2018   Procedure: CYSTOSCOPY WITH STENT  EXCHANGE;  Surgeon: Raynelle Bring, MD;  Location: [facility];  Service: Urology;  Laterality: Left;  . CYSTOSCOPY WITH RETROGRADE PYELOGRAM, URETEROSCOPY AND STENT PLACEMENT Left 12-29-2008   dr Alinda Money  '@[facility]'    WITH BALLOON DILATION LEFT URETERAL STRICTURE  . CYSTOSCOPY WITH RETROGRADE PYELOGRAM, URETEROSCOPY AND STENT PLACEMENT Left 09-12-2008    dr Jeffie Pollock '@[facility]'   . CYSTOSCOPY WITH STENT PLACEMENT Left  07/26/2018   Procedure: CYSTOSCOPY WITH STENT CHANGE;  Surgeon: Raynelle Bring, MD;  [facility];  Service: Urology;  Laterality: Left;  . CYSTOSCOPY WITH STENT PLACEMENT Left 12/03/2018   Procedure: CYSTOSCOPY WITH STENT CHANGE;  Surgeon: Raynelle Bring, MD;  [facility];  Service: Urology;  Laterality: Left;  . CYSTOSCOPY WITH STENT PLACEMENT Left 04/22/2019   Procedure: CYSTOSCOPY WITH STENT CHANGE;  Surgeon: Raynelle Bring, MD;  Location: [facility];  Service: Urology;  Laterality: Left;  . CYSTOSCOPY WITH STENT PLACEMENT Left 10/03/2019   Procedure: CYSTOSCOPY WITH STENT CHANGE;  Surgeon: Raynelle Bring, MD;  Location[facility];  Service: Urology;  Laterality: Left;  . EYE SURGERY    . FOOT SURGERY Left 06-23-2010   dr Beola Cord   left first and second toes  . INSERTION OF MESH N/A 04/23/2013   Procedure: INSERTION OF MESH;  Surgeon: Adin Hector, MD;  Location[facility];  Service: General;  Laterality: N/A;  . JOINT REPLACEMENT    . KNEE ARTHROSCOPY Right 04-27-2007  dr Shellia Carwin '@[facility]'   . LAPAROSCOPIC LYSIS OF ADHESIONS N/A 04/23/2013   Procedure: LAPAROSCOPIC LYSIS OF ADHESIONS;  Surgeon: Adin Hector, MD;  Location: [facility];  Service: General;  Laterality: N/A;  . [place] and 1999  . POSTERIOR LUMBAR FUSION  12/17/2012   L3 -- L5 laminectomy and L3 -- 5 fusion  . TOTAL HIP ARTHROPLASTY Left 07/04/2014   Procedure: LEFT TOTAL HIP ARTHROPLASTY ANTERIOR APPROACH;  Surgeon: Gearlean Alf, MD;  Location: [facility];  Service: Orthopedics;  Laterality: Left;  . TOTAL HIP ARTHROPLASTY Right 2009  . TOTAL KNEE ARTHROPLASTY Right 09-15-2009   dr Shellia Carwin '@[facility]'   . VENTRAL HERNIA REPAIR N/A 07/19/2012   Procedure: LAPAROSCOPIC LYSIS OF ADHESIONS, SMALL BOWEL RESECTION, SEROSAL REPAIR, PRIMARY VENTRAL HERNIA REPAIR;  Surgeon: Adin Hector, MD;  Location[facility];  Service: General;  Laterality: N/A;  . VENTRAL HERNIA REPAIR N/A 04/23/2013   Procedure:  LAPAROSCOPIC exploration and repair of hernia in abdominal VENTRAL wall  HERNIA;  Surgeon: Adin Hector, MD;  Location[facility];  Service: General;  Laterality: N/A;    Family History  Problem Relation Age of Onset  . Diabetes Mother   . Lung cancer Mother   . Diabetes Father   . Liver cancer Father   . Colon cancer Sister   . Diabetes Sister   . Seizures Daughter   . Diabetes Son   . Diabetes Sister   . Hypertension Sister   . Kidney disease Sister   . Breast cancer Neg Hx   . Allergic rhinitis Neg Hx   . Angioedema Neg Hx   . Asthma Neg Hx   . Atopy Neg Hx   . Eczema Neg Hx   . Immunodeficiency Neg Hx   . Urticaria Neg Hx   . Colon polyps Neg Hx   .  Esophageal cancer Neg Hx   . Rectal cancer Neg Hx   . Stomach cancer Neg Hx     New complaints: Social history: Lives with her son and daughter in law     Review of Systems  Constitutional: Negative for diaphoresis.  Eyes: Negative for pain.  Respiratory: Negative for shortness of breath.   Cardiovascular: Positive for leg swelling (bil). Negative for chest pain and palpitations.  Gastrointestinal: Negative for abdominal pain.  Endocrine: Negative for polydipsia.  Skin: Negative for rash.  Neurological: Negative for dizziness, weakness and headaches.  Hematological: Does not bruise/bleed easily.  All other systems reviewed and are negative.      Objective:   Physical Exam Vitals and nursing note reviewed.  Constitutional:      General: She is not in acute distress.    Appearance: Normal appearance. She is well-developed.  HENT:     Head: Normocephalic.     Nose: Nose normal.  Eyes:     Pupils: Pupils are equal, round, and reactive to light.  Neck:     Vascular: No carotid bruit or JVD.  Cardiovascular:     Rate and Rhythm: Normal rate and regular rhythm.     Heart sounds: Normal heart sounds.  Pulmonary:     Effort: Pulmonary effort is normal. No respiratory distress.     Breath sounds: Normal  breath sounds. No wheezing or rales.  Chest:     Chest wall: No tenderness.  Abdominal:     General: Bowel sounds are normal. There is no distension or abdominal bruit.     Palpations: Abdomen is soft. There is no hepatomegaly, splenomegaly, mass or pulsatile mass.     Tenderness: There is no abdominal tenderness.  Musculoskeletal:        General: Normal range of motion.     Cervical back: Normal range of motion and neck supple.     Right lower leg: Edema (1+) present.     Left lower leg: Edema (2+) present.  Lymphadenopathy:     Cervical: No cervical adenopathy.  Skin:    General: Skin is warm and dry.  Neurological:     Mental Status: She is alert and oriented to person, place, and time.     Deep Tendon Reflexes: Reflexes are normal and symmetric.  Psychiatric:        Behavior: Behavior normal.        Thought Content: Thought content normal.        Judgment: Judgment normal.    BP 123/70   Pulse 86   Temp 97.7 F (36.5 C) (Temporal)   Resp 20   Ht 5' (1.524 m)   Wt 200 lb (90.7 kg)   SpO2 93%   BMI 39.06 kg/m        Assessment & Plan:  [name] comes in today with chief complaint of Medical Management of Chronic Issues   Diagnosis and orders addressed:  1. Essential hypertension Stop zestoretic Ordered lisnopril 69m - lisinopril (ZESTRIL) 20 MG tablet; Take 1 tablet (20 mg total) by mouth daily.  Dispense: 90 tablet; Refill: 1 - CBC with Differential/Platelet - CMP14+EGFR - Lipid panel - Urinalysis, Complete  2. Peripheral polyneuropathy Do not go barefooted Check  feet daily for any lesions or cuts  3. Chronic midline thoracic back pain Moist heat Rest No heavy lifting - oxyCODONE-acetaminophen (PERCOCET) 7.5-325 MG tablet; Take 1 tablet by mouth every 8 (eight) hours as needed for severe pain.  Dispense: 90 tablet; Refill:  0 - oxyCODONE-acetaminophen (PERCOCET) 7.5-325 MG tablet; Take 1 tablet by mouth every 8 (eight) hours as needed  for severe pain.  Dispense: 90 tablet; Refill: 0 - oxyCODONE-acetaminophen (PERCOCET) 7.5-325 MG tablet; Take 1 tablet by mouth every 8 (eight) hours as needed for severe pain.  Dispense: 90 tablet; Refill: 0 - gabapentin (NEURONTIN) 300 MG capsule; Take 1 capsule (300 mg total) by mouth 3 (three) times daily.  Dispense: 90 capsule; Refill: 2  4. Recurrent major depressive disorder, in partial remission ([place]) Stress management  5. Obesity (BMI 30-39.9) Discussed diet and exercise for person with BMI >25 Will recheck weight in 3-6 months   6. Peripheral edema Elevate legs when sitting Wear compression socks - furosemide (LASIX) 20 MG tablet; Take 1 tablet (20 mg total) by mouth daily.  Dispense: 90 tablet; Refill: 1  7. Chronic midline low back pain with sciatica, sciatica laterality unspecified - gabapentin (NEURONTIN) 300 MG capsule; Take 1 capsule (300 mg total) by mouth 3 (three) times daily.  Dispense: 90 capsule; Refill: 2  8. Other chronic pain  amitriptyline (ELAVIL) 25 MG tablet; Take 1 tablet (25 mg total) by mouth at bedtime.  Dispense: 90 tablet; Refill: 1   Labs pending Health Maintenance reviewed Diet and exercise encouraged  Follow up plan: 3 months   Mary-Margaret Hassell Done, FNP

## 2019-12-20 LAB — CBC WITH DIFFERENTIAL/PLATELET
Basophils Absolute: 0 10*3/uL (ref 0.0–0.2)
Basos: 0 %
EOS (ABSOLUTE): 0.2 10*3/uL (ref 0.0–0.4)
Eos: 3 %
Hematocrit: 40.6 % (ref 34.0–46.6)
Hemoglobin: 13.7 g/dL (ref 11.1–15.9)
Immature Grans (Abs): 0 10*3/uL (ref 0.0–0.1)
Immature Granulocytes: 0 %
Lymphocytes Absolute: 2.7 10*3/uL (ref 0.7–3.1)
Lymphs: 34 %
MCH: 30.7 pg (ref 26.6–33.0)
MCHC: 33.7 g/dL (ref 31.5–35.7)
MCV: 91 fL (ref 79–97)
Monocytes Absolute: 0.7 10*3/uL (ref 0.1–0.9)
Monocytes: 8 %
Neutrophils Absolute: 4.5 10*3/uL (ref 1.4–7.0)
Neutrophils: 55 %
Platelets: 281 10*3/uL (ref 150–450)
RBC: 4.46 x10E6/uL (ref 3.77–5.28)
RDW: 13.5 % (ref 11.7–15.4)
WBC: 8.2 10*3/uL (ref 3.4–10.8)

## 2019-12-20 LAB — CMP14+EGFR
ALT: 18 IU/L (ref 0–32)
AST: 21 IU/L (ref 0–40)
Albumin/Globulin Ratio: 1.8 (ref 1.2–2.2)
Albumin: 4.4 g/dL (ref 3.7–4.7)
Alkaline Phosphatase: 68 IU/L (ref 48–121)
BUN/Creatinine Ratio: 15 (ref 12–28)
BUN: 12 mg/dL (ref 8–27)
Bilirubin Total: 0.5 mg/dL (ref 0.0–1.2)
CO2: 24 mmol/L (ref 20–29)
Calcium: 9.1 mg/dL (ref 8.7–10.3)
Chloride: 101 mmol/L (ref 96–106)
Creatinine, Ser: 0.8 mg/dL (ref 0.57–1.00)
GFR calc Af Amer: 83 mL/min/{1.73_m2} (ref >59)
GFR calc non Af Amer: 72 mL/min/{1.73_m2} (ref >59)
Globulin, Total: 2.4 g/dL (ref 1.5–4.5)
Glucose: 93 mg/dL (ref 65–99)
Potassium: 4.2 mmol/L (ref 3.5–5.2)
Sodium: 139 mmol/L (ref 134–144)
Total Protein: 6.8 g/dL (ref 6.0–8.5)

## 2019-12-20 LAB — LIPID PANEL
Chol/HDL Ratio: 3.3 ratio (ref 0.0–4.4)
Cholesterol, Total: 202 mg/dL — ABNORMAL HIGH (ref 100–199)
HDL: 61 mg/dL (ref >39)
LDL Chol Calc (NIH): 109 mg/dL — ABNORMAL HIGH (ref 0–99)
Triglycerides: 187 mg/dL — ABNORMAL HIGH (ref 0–149)
VLDL Cholesterol Cal: 32 mg/dL (ref 5–40)

## 2019-12-25 ENCOUNTER — Telehealth: Payer: Self-pay | Admitting: Nurse Practitioner

## 2019-12-25 DIAGNOSIS — M5440 Lumbago with sciatica, unspecified side: Secondary | ICD-10-CM

## 2019-12-25 DIAGNOSIS — M546 Pain in thoracic spine: Secondary | ICD-10-CM

## 2019-12-25 DIAGNOSIS — G8929 Other chronic pain: Secondary | ICD-10-CM

## 2019-12-25 NOTE — Telephone Encounter (Signed)
°  Prescription Request  12/25/2019  What is the name of the medication or equipment? methocarbamol (ROBAXIN) 500 MG tablet and meloxicam (MOBIC) 15 MG tablet and nystatin ointment  Have you contacted your pharmacy to request a refill? (if applicable) no needs new rx --pt has here 12/19/2019  Which pharmacy would you like this sent to? [facility]   Patient notified that their request is being sent to the clinical staff for review and that they should receive a response within 2 business days.

## 2019-12-27 MED ORDER — MELOXICAM 7.5 MG PO TABS: 7.5000 mg | ORAL_TABLET | Freq: Every day | ORAL | 1 refills | Status: DC

## 2019-12-27 MED ORDER — METHOCARBAMOL 500 MG PO TABS: 500.0000 mg | ORAL_TABLET | Freq: Every day | ORAL | 1 refills | Status: DC

## 2019-12-27 NOTE — Telephone Encounter (Signed)
Rx  sent  to  [facility]

## 2019-12-27 NOTE — Telephone Encounter (Signed)
Left message that requested rx sent into pharmacy and to call back with any questions or concerns.

## 2020-01-01 DIAGNOSIS — Z96643 Presence of artificial hip joint, bilateral: Secondary | ICD-10-CM | POA: Diagnosis not present

## 2020-01-01 DIAGNOSIS — Z471 Aftercare following joint replacement surgery: Secondary | ICD-10-CM | POA: Diagnosis not present

## 2020-01-02 DIAGNOSIS — Z0100 Encounter for examination of eyes and vision without abnormal findings: Secondary | ICD-10-CM | POA: Diagnosis not present

## 2020-01-07 ENCOUNTER — Telehealth (HOSPITAL_COMMUNITY): Payer: Self-pay | Admitting: Psychiatry

## 2020-01-17 ENCOUNTER — Encounter (INDEPENDENT_AMBULATORY_CARE_PROVIDER_SITE_OTHER): Payer: Self-pay

## 2020-01-30 ENCOUNTER — Ambulatory Visit: Payer: Medicare HMO | Admitting: Nurse Practitioner

## 2020-02-13 DIAGNOSIS — L820 Inflamed seborrheic keratosis: Secondary | ICD-10-CM | POA: Diagnosis not present

## 2020-02-13 DIAGNOSIS — Z08 Encounter for follow-up examination after completed treatment for malignant neoplasm: Secondary | ICD-10-CM | POA: Diagnosis not present

## 2020-02-13 DIAGNOSIS — Z85828 Personal history of other malignant neoplasm of skin: Secondary | ICD-10-CM | POA: Diagnosis not present

## 2020-02-13 DIAGNOSIS — D225 Melanocytic nevi of trunk: Secondary | ICD-10-CM | POA: Diagnosis not present

## 2020-02-19 ENCOUNTER — Telehealth (HOSPITAL_COMMUNITY): Payer: Self-pay | Admitting: Psychiatry

## 2020-02-19 ENCOUNTER — Other Ambulatory Visit: Payer: Self-pay | Admitting: Nurse Practitioner

## 2020-02-24 DIAGNOSIS — H353132 Nonexudative age-related macular degeneration, bilateral, intermediate dry stage: Secondary | ICD-10-CM | POA: Diagnosis not present

## 2020-02-24 DIAGNOSIS — Z961 Presence of intraocular lens: Secondary | ICD-10-CM | POA: Diagnosis not present

## 2020-03-04 DIAGNOSIS — N135 Crossing vessel and stricture of ureter without hydronephrosis: Secondary | ICD-10-CM | POA: Diagnosis not present

## 2020-03-04 DIAGNOSIS — R8271 Bacteriuria: Secondary | ICD-10-CM | POA: Diagnosis not present

## 2020-03-12 ENCOUNTER — Other Ambulatory Visit: Payer: Self-pay | Admitting: Urology

## 2020-03-16 ENCOUNTER — Telehealth: Payer: Self-pay | Admitting: Nurse Practitioner

## 2020-03-16 NOTE — Telephone Encounter (Signed)
Spoke with patient and she said that she worked it out so she could keep her original appt. Advised patient we would leave the schedule the same.

## 2020-03-18 ENCOUNTER — Encounter (HOSPITAL_COMMUNITY): Admission: RE | Admit: 2020-03-18 | Payer: Medicare HMO | Source: Ambulatory Visit

## 2020-03-18 NOTE — Patient Instructions (Signed)
DUE TO COVID-19 ONLY ONE VISITOR IS ALLOWED TO COME WITH YOU AND STAY IN THE WAITING ROOM ONLY DURING PRE OP AND PROCEDURE DAY OF SURGERY. THE 1 VISITOR  MAY VISIT WITH YOU AFTER SURGERY IN YOUR PRIVATE ROOM DURING VISITING HOURS ONLY!  YOU NEED TO HAVE A COVID 19 TEST ON: 03/19/20 @ 11:20 AM, THIS TEST MUST BE DONE BEFORE SURGERY,  COVID TESTING SITE [place] [id number], IT IS ON THE RIGHT GOING OUT [address] APPROXIMATELY  2 MINUTES PAST [employer] ON THE RIGHT. ONCE YOUR COVID TEST IS COMPLETED,  PLEASE BEGIN THE QUARANTINE INSTRUCTIONS AS OUTLINED IN YOUR HANDOUT.                Millisa Giarrusso Gato    Your procedure is scheduled on: 03/23/20   Report to [facility] Main  Entrance   Report to admitting at: 1:15 PM     Call this number if you have problems the morning of surgery [phone]    Remember: Do not eat solid food :After Midnight. Clear liquids until: 12:15 PM.  CLEAR LIQUID DIET   Foods Allowed                                                                     Foods Excluded  Coffee and tea, regular and decaf                             liquids that you cannot  Plain Jell-O any favor except red or purple                                           see through such as: Fruit ices (not with fruit pulp)                                     milk, soups, orange juice  Iced Popsicles                                    All solid food Carbonated beverages, regular and diet                                    Cranberry, grape and apple juices Sports drinks like Gatorade Lightly seasoned clear broth or consume(fat free) Sugar, honey syrup  Sample Menu Breakfast                                Lunch                                     Supper Cranberry juice  Beef broth                            Chicken broth Jell-O                                     Grape juice                           Apple juice Coffee or  tea                        Jell-O                                      Popsicle                                                Coffee or tea                        Coffee or tea  _____________________________________________________________________  BRUSH YOUR TEETH MORNING OF SURGERY AND RINSE YOUR MOUTH OUT, NO CHEWING GUM CANDY OR MINTS.                               You may not have any metal on your body including hair pins and              piercings  Do not wear jewelry, make-up, lotions, powders or perfumes, deodorant             Do not wear nail polish on your fingernails.  Do not shave  48 hours prior to surgery.          Do not bring valuables to the hospital. [place].  Contacts, dentures or bridgework may not be worn into surgery.  Leave suitcase in the car. After surgery it may be brought to your room.     Patients discharged the day of surgery will not be allowed to drive home. IF YOU ARE HAVING SURGERY AND GOING HOME THE SAME DAY, YOU MUST HAVE AN ADULT TO DRIVE YOU HOME AND BE WITH YOU FOR 24 HOURS. YOU MAY GO HOME BY TAXI OR UBER OR ORTHERWISE, BUT AN ADULT MUST ACCOMPANY YOU HOME AND STAY WITH YOU FOR 24 HOURS.  Name and phone number of your driver:  Special Instructions: N/A              Please read over the following fact sheets you were given: _____________________________________________________________________          [facility] - Preparing for Surgery Before surgery, you can play an important role.  Because skin is not sterile, your skin needs to be as free of germs as possible.  You can reduce the number of germs on your skin by washing with CHG (chlorahexidine gluconate) soap before surgery.  CHG is an antiseptic cleaner which kills germs and bonds with the skin to  continue killing germs even after washing. Please DO NOT use if you have an allergy to CHG or antibacterial soaps.  If your skin becomes  reddened/irritated stop using the CHG and inform your nurse when you arrive at Short Stay. Do not shave (including legs and underarms) for at least 48 hours prior to the first CHG shower.  You may shave your face/neck. Please follow these instructions carefully:  1.  Shower with CHG Soap the night before surgery and the  morning of Surgery.  2.  If you choose to wash your hair, wash your hair first as usual with your  normal  shampoo.  3.  After you shampoo, rinse your hair and body thoroughly to remove the  shampoo.                           4.  Use CHG as you would any other liquid soap.  You can apply chg directly  to the skin and wash                       Gently with a scrungie or clean washcloth.  5.  Apply the CHG Soap to your body ONLY FROM THE NECK DOWN.   Do not use on face/ open                           Wound or open sores. Avoid contact with eyes, ears mouth and genitals (private parts).                       Wash face,  Genitals (private parts) with your normal soap.             6.  Wash thoroughly, paying special attention to the area where your surgery  will be performed.  7.  Thoroughly rinse your body with warm water from the neck down.  8.  DO NOT shower/wash with your normal soap after using and rinsing off  the CHG Soap.                9.  Pat yourself dry with a clean towel.            10.  Wear clean pajamas.            11.  Place clean sheets on your bed the night of your first shower and do not  sleep with pets. Day of Surgery : Do not apply any lotions/deodorants the morning of surgery.  Please wear clean clothes to the hospital/surgery center.  FAILURE TO FOLLOW THESE INSTRUCTIONS MAY RESULT IN THE CANCELLATION OF YOUR SURGERY PATIENT SIGNATURE_________________________________  NURSE SIGNATURE__________________________________  ________________________________________________________________________

## 2020-03-19 ENCOUNTER — Encounter (HOSPITAL_COMMUNITY): Payer: Self-pay

## 2020-03-19 ENCOUNTER — Encounter (HOSPITAL_COMMUNITY)
Admission: RE | Admit: 2020-03-19 | Discharge: 2020-03-19 | Disposition: A | Discharge status: Home / Self Care | Payer: Medicare HMO | Source: Ambulatory Visit | Attending: Urology | Admitting: Urology

## 2020-03-19 ENCOUNTER — Other Ambulatory Visit (HOSPITAL_COMMUNITY)
Admission: RE | Admit: 2020-03-19 | Discharge: 2020-03-19 | Disposition: A | Discharge status: Home / Self Care | Payer: Medicare HMO | Source: Ambulatory Visit | Attending: Urology | Admitting: Urology

## 2020-03-19 ENCOUNTER — Other Ambulatory Visit: Payer: Self-pay

## 2020-03-19 DIAGNOSIS — Z20822 Contact with and (suspected) exposure to covid-19: Secondary | ICD-10-CM | POA: Diagnosis not present

## 2020-03-19 DIAGNOSIS — Z01818 Encounter for other preprocedural examination: Secondary | ICD-10-CM | POA: Diagnosis not present

## 2020-03-19 HISTORY — DX: Malignant (primary) neoplasm, unspecified: C80.1

## 2020-03-19 HISTORY — DX: Chronic kidney disease, unspecified: N18.9

## 2020-03-19 LAB — CBC
HCT: 42.3 % (ref 36.0–46.0)
Hemoglobin: 13.9 g/dL (ref 12.0–15.0)
MCH: 31.7 pg (ref 26.0–34.0)
MCHC: 32.9 g/dL (ref 30.0–36.0)
MCV: 96.6 fL (ref 80.0–100.0)
Platelets: 330 10*3/uL (ref 150–400)
RBC: 4.38 MIL/uL (ref 3.87–5.11)
RDW: 12.8 % (ref 11.5–15.5)
WBC: 8.6 10*3/uL (ref 4.0–10.5)
nRBC: 0 % (ref 0.0–0.2)

## 2020-03-19 LAB — BASIC METABOLIC PANEL
Anion gap: 10 (ref 5–15)
BUN: 12 mg/dL (ref 8–23)
CO2: 25 mmol/L (ref 22–32)
Calcium: 9.1 mg/dL (ref 8.9–10.3)
Chloride: 105 mmol/L (ref 98–111)
Creatinine, Ser: 0.86 mg/dL (ref 0.44–1.00)
GFR, Estimated: >60 mL/min (ref >60)
Glucose, Bld: 164 mg/dL — ABNORMAL HIGH (ref 70–99)
Potassium: 4.1 mmol/L (ref 3.5–5.1)
Sodium: 140 mmol/L (ref 135–145)

## 2020-03-19 LAB — SARS CORONAVIRUS 2 (TAT 6-24 HRS): SARS Coronavirus 2: NEGATIVE

## 2020-03-19 NOTE — Progress Notes (Addendum)
COVID Vaccine Completed: Yes Date COVID Vaccine completed:08/12/19 COVID vaccine manufacturer:     [employer]     PCP - Harmon Pier. FNP. LOV: 12/19/19 Cardiologist - NO  Chest x-ray -  EKG -  Stress Test -  ECHO -  Cardiac Cath -  Pacemaker/ICD device last checked:  Sleep Study -  CPAP -   Fasting Blood Sugar -  Checks Blood Sugar _____ times a day  Blood Thinner Instructions: Aspirin Instructions: Last Dose:  Anesthesia review: Hx: HTN  Patient denies shortness of breath, fever, cough and chest pain at PAT appointment   Patient verbalized understanding of instructions that were given to them at the PAT appointment. Patient was also instructed that they will need to review over the PAT instructions again at home before surgery.

## 2020-03-20 ENCOUNTER — Encounter: Payer: Self-pay | Admitting: Nurse Practitioner

## 2020-03-20 ENCOUNTER — Ambulatory Visit (INDEPENDENT_AMBULATORY_CARE_PROVIDER_SITE_OTHER): Payer: Medicare HMO | Admitting: Nurse Practitioner

## 2020-03-20 VITALS — BP 129/65 | HR 57 | Temp 97.9°F | Resp 20 | Ht 60.0 in | Wt 197.0 lb

## 2020-03-20 DIAGNOSIS — G629 Polyneuropathy, unspecified: Secondary | ICD-10-CM | POA: Diagnosis not present

## 2020-03-20 DIAGNOSIS — G8929 Other chronic pain: Secondary | ICD-10-CM | POA: Diagnosis not present

## 2020-03-20 DIAGNOSIS — M546 Pain in thoracic spine: Secondary | ICD-10-CM | POA: Diagnosis not present

## 2020-03-20 DIAGNOSIS — R69 Illness, unspecified: Secondary | ICD-10-CM | POA: Diagnosis not present

## 2020-03-20 DIAGNOSIS — R739 Hyperglycemia, unspecified: Secondary | ICD-10-CM | POA: Diagnosis not present

## 2020-03-20 DIAGNOSIS — R609 Edema, unspecified: Secondary | ICD-10-CM | POA: Insufficient documentation

## 2020-03-20 DIAGNOSIS — F3341 Major depressive disorder, recurrent, in partial remission: Secondary | ICD-10-CM

## 2020-03-20 DIAGNOSIS — Z23 Encounter for immunization: Secondary | ICD-10-CM

## 2020-03-20 DIAGNOSIS — I10 Essential (primary) hypertension: Secondary | ICD-10-CM

## 2020-03-20 DIAGNOSIS — E669 Obesity, unspecified: Secondary | ICD-10-CM

## 2020-03-20 DIAGNOSIS — M5440 Lumbago with sciatica, unspecified side: Secondary | ICD-10-CM | POA: Diagnosis not present

## 2020-03-20 DIAGNOSIS — R600 Localized edema: Secondary | ICD-10-CM | POA: Insufficient documentation

## 2020-03-20 LAB — BAYER DCA HB A1C WAIVED: HB A1C (BAYER DCA - WAIVED): 5.7 % (ref <7.0)

## 2020-03-20 MED ORDER — AMITRIPTYLINE HCL 25 MG PO TABS: 25.0000 mg | ORAL_TABLET | Freq: Every day | ORAL | 1 refills | Status: DC

## 2020-03-20 MED ORDER — OXYCODONE-ACETAMINOPHEN 7.5-325 MG PO TABS: 1.0000 | ORAL_TABLET | Freq: Three times a day (TID) | ORAL | 0 refills | Status: AC | PRN

## 2020-03-20 MED ORDER — LISINOPRIL 20 MG PO TABS: 20.0000 mg | ORAL_TABLET | Freq: Every day | ORAL | 1 refills | Status: DC

## 2020-03-20 MED ORDER — OXYCODONE-ACETAMINOPHEN 7.5-325 MG PO TABS: 1.0000 | ORAL_TABLET | Freq: Three times a day (TID) | ORAL | 0 refills | Status: DC | PRN

## 2020-03-20 MED ORDER — NYSTATIN 100000 UNIT/GM EX CREA: 1.0000 "application " | TOPICAL_CREAM | Freq: Two times a day (BID) | CUTANEOUS | 1 refills | Status: DC

## 2020-03-20 MED ORDER — FUROSEMIDE 20 MG PO TABS: 20.0000 mg | ORAL_TABLET | Freq: Every day | ORAL | 1 refills | Status: DC

## 2020-03-20 MED ORDER — GABAPENTIN 300 MG PO CAPS: 300.0000 mg | ORAL_CAPSULE | Freq: Three times a day (TID) | ORAL | 2 refills | Status: DC

## 2020-03-20 NOTE — H&P (Signed)
Office Visit Report     03/04/2020   --------------------------------------------------------------------------------   [name]  MRN: [id number]  DOB: 1946/02/24, 74 year old Female  SSN: [id number]   PRIMARY CARE:  Chipper Herb, MD  REFERRING:  Raynelle Bring, Eduardo Osier  PROVIDER:  Raynelle Bring, M.D.  LOCATION:  [facility] [phone]     --------------------------------------------------------------------------------   CC/HPI: Left ureteral obstruction   [name] returns today for ongoing management of her left ureteral obstruction. We have been managing her proximal left ureteral stricture with a history of recurrent left pyelonephritis with ureteral stent is since August of 2019. Her stent was most recently changed in May 2021. She has tolerated this well. She currently denies any significant dysuria and has not needed to be treated with antibiotics for urinary tract infections. She follows up today for further management. She is interested in potentially seeing if it might be possible to remove her stent.     ALLERGIES: Ondansetron HCl TABS Sulfa Drugs    MEDICATIONS: [place] Allergy  Amitriptyline Hcl 25 mg tablet Oral  Lisinopril 20 MG Oral Tablet Oral  Neurontin 300 MG Oral Capsule Oral  Oxycodone-Acetaminophen 7.5 mg-325 mg tablet 1 tablet PO Daily     GU PSH: Catheterization For Collection Of Specimen, Single Patient, All Places Of Service - 2019 Cysto Uretero Balloon Dil Strict - 2010 Cystoscopy Insert Stent, Left - 10/03/2019, Left - 04/22/2019, Left - 12/03/2018, Left - 2020, Left - 03/29/2018, Left - 2019, 2010, 2010 Omental Flap; Intra-abdom - 2010 Release Ureter - 2010 Sling - 2010       PSH Notes: Total Hip Replacement, Ureterolysis, Abdominal Surgery Omental Flap, Intra-abdominal, Cystourethroscopy W/ Ureteroscopy W/ Tx Of Ureteral Strict, Cystoscopy With Insertion Of Ureteral Stent Left, Mid-Urethral Sling Operation, Cystoscopy  With Insertion Of Ureteral Stent Left, Back Surgery   NON-GU PSH: COLONOSCOPY FLX W/ENDOSCOPIC MUCOSAL RESECTION Total Hip Replacement - 2016     GU PMH: Flank Pain - 2019 Acute Cystitis/UTI - 2018 Chronic cystitis (w/o hematuria), Chronic cystitis - 2016 Ureteral stricture, Ureteral obstruction - 2016, Ureteral stricture, - 2014 Dysuria, Dysuria - 2016 Urinary Tract Inf, Unspec site, Pyuria - 2016, Urinary tract infection, - 2016 Urinary Urgency, Urinary urgency - 2016 Pyelonephritis, Pyelonephritis, acute - 2014      PMH Notes:   1) Left hydronephrosis and ureteral obstruction secondary to ureteral stricture: She is s/p a left anterior lumbar decompression and arthrodesis in September 2009. This was performed via an anterior retroperitoneal approach. She presented to the emergency department in October 2009 with left lower quadrant pain, dysuria, and fever. A CT scan demonstrated a left lower quadrant fluid collection with left hydronephrosis. She was admitted to the hospital and was seen in consultation by Dr. Jeffie Pollock. A left percutaneous nephrostomy tube was placed. CT guided aspiration of the fluid collection was also performed and the fluid was consistent with a probable lymphocele. An antegrade stent was placed which was then removed in November 2009. She did well until May 2010 when she again presented with flank pain and fever and a renal ultrasound demonstrated left hydronephrosis. She was taken to the OR by Dr. Jeffie Pollock and a retrograde pyelogram showed a long proximal narrowing from the level of the left UPJ to the level of the bifurcation of the aorta. A stent was again placed which was removed in June 2010. I initially evaluated her in July 2010 and she was asymptomatic and she was therefore observed. However, she  again developed left flank pain in August 2010 and was taken to the OR for further evaluation. She was found to have a short ureteral stricture at the level of L4 likely a  result of an ischemic injury from her prior surgery. This was treated at that time with balloon dilation and stent placement. She again developed pyelonephritis and elected to proceed with definitive surgical repair. She underwent exploration on 03/11/09 and was found to have retroperitoneal fibrosis as the cause of her ureteral obstruction and likely related to her prior surgery. Her ureter was very difficult to remove from the surrounding fibrosis but she was able to undergo definitive ureterolysis and omental wrap. She was noted to have declining relative renal function of her left kidney in March 2015 (25%). After discussing her options, the complexity of her surgical history, and the fact her global renal function remained stable, she elected to proceed with observation rather than intervention.   Mar 2019: Left pyelonephritis - E. coli  Jun 2019: Left pyelonephritis - E. coli resistant to fluorquinolones, TMP/SMX, and tetracycline (sensitive to cephalexin)  Aug 2019: Left ureteral stent placement (initial) - 6 x 03 Apr 2018: Left ureteral stent - 6 x 31 Jul 2018: Left ureteral stent - 6 x 30 Nov 2018: L ureteral stent - 6 x 02 May 2019: L ureteral stent - 6x 30 Sep 2019: L ureteral stent - 6 x 24   2) Stress incontinence: She is s/p a urethral sling by Dr. Jeffie Pollock in 2005.   3) Recurrent UTIs: She has previously been on prophylaxis with nitrofurantoin but was able to discontinue this therapy in 2013. She developed recurrent pyelonephritis in 2017.       NON-GU PMH: Bacteriuria - 09/04/2019 Pyuria/other UA findings (Stable) - 2020, - 2018 Hypertension    FAMILY HISTORY: 1 Daughter - Daughter 2 sons - Son Death of family member - Sister, Father, Mother Diabetes - Mother, Father, Son, Sister End Stage Renal Disease - No Family History   SOCIAL HISTORY: Marital Status: Widowed Preferred Language: English; Ethnicity: Not Hispanic Or Latino; Race: White Current Smoking Status: Patient  has never smoked.   Tobacco Use Assessment Completed: Used Tobacco in last 30 days? Does not drink anymore.  Drinks 3 caffeinated drinks per day.    REVIEW OF SYSTEMS:    GU Review Female:   Patient denies frequent urination, hard to postpone urination, burning /pain with urination, get up at night to urinate, leakage of urine, stream starts and stops, trouble starting your stream, have to strain to urinate, and currently pregnant.  Gastrointestinal (Upper):   Patient denies nausea and vomiting.  Gastrointestinal (Lower):   Patient denies constipation and diarrhea.  Constitutional:   Patient denies fever, night sweats, weight loss, and fatigue.  Skin:   Patient denies skin rash/ lesion and itching.  Eyes:   Patient denies blurred vision and double vision.  Ears/ Nose/ Throat:   Patient denies sore throat and sinus problems.  Hematologic/Lymphatic:   Patient denies swollen glands and easy bruising.  Cardiovascular:   Patient denies leg swelling and chest pains.  Respiratory:   Patient denies cough and shortness of breath.  Endocrine:   Patient denies excessive thirst.  Musculoskeletal:   Patient denies back pain and joint pain.  Neurological:   Patient denies headaches and dizziness.  Psychologic:   Patient denies depression and anxiety.   VITAL SIGNS:      03/04/2020 12:38 PM  Weight 193 lb /  87.54 kg  Height 60 in / 152.4 cm  BP 148/68 mmHg  Pulse 61 /min  Temperature 97.1 F / 36.1 C  BMI 37.7 kg/m   MULTI-SYSTEM PHYSICAL EXAMINATION:    Constitutional: Well-nourished. No physical deformities. Normally developed. Good grooming.  Respiratory: No labored breathing, no use of accessory muscles. Clear bilaterally.  Cardiovascular: Normal temperature, normal extremity pulses, no swelling, no varicosities. Regular rate and rhythm.     Complexity of Data:  Records Review:   Previous Patient Records   PROCEDURES:          Urinalysis w/Scope Dipstick Dipstick Cont'd Micro   Color: Yellow Bilirubin: Neg mg/dL WBC/hpf: >60/hpf  Appearance: Turbid Ketones: Neg mg/dL RBC/hpf: 10 - 20/hpf  Specific Gravity: 1.025 Blood: 2+ ery/uL Bacteria: Many (>50/hpf)  pH: 6.0 Protein: Trace mg/dL Cystals: NS (Not Seen)  Glucose: Neg mg/dL Urobilinogen: 0.2 mg/dL Casts: NS (Not Seen)    Nitrites: Positive Trichomonas: Not Present    Leukocyte Esterase: 3+ leu/uL Mucous: Not Present      Epithelial Cells: 0 - 5/hpf      Yeast: NS (Not Seen)      Sperm: Not Present    ASSESSMENT:      ICD-10 Details  1 GU:   Ureteral stricture - N13.5   2 NON-GU:   Bacteriuria - R82.71    PLAN:            Medications Stop Meds: Uristat  Discontinue: 03/04/2020  - Reason: The medication cycle was completed.            Orders Labs Urine Culture          Schedule Return Visit/Planned Activity: Other See Visit Notes             Note: Will call to schedule surgery. He          Document Letter(s):  Created for Patient: Clinical Summary         Notes:   1. Left ureteral obstruction: Her urine has been cultured today. We have agreed to proceed with further evaluation endoscopically with stent removal and retrograde pyelography and possible ureteroscopy to assess her degree of current obstruction. If she does not appear to be clearly obstructive, we will consider stent removal with a follow-up renogram. She understands that she may be at risk for developing recurrent pyelonephritis again but is willing to do this if there is no clear evidence of obstruction. If there is clear evidence of obstruction on her upcoming evaluation, we will simply replace her stent. Fortunately, she has not had any concern for recurrent pyelonephritis since stent management was instituted.   Cc: Dr. Redge Gainer        Next Appointment:      Next Appointment: 03/04/2020 12:30 PM    Appointment Type: [facility] OP Appointment    Location: [facility] [phone]    Provider: Raynelle Bring, M.D.    Reason for Visit: PO 5 MON WITH UA PRIOR      * Signed by Raynelle Bring, M.D. on 03/04/20 at 8:55 PM (EDT)*

## 2020-03-20 NOTE — Progress Notes (Signed)
Subjective:    Patient ID: [name], female    DOB: 1946-02-23, 74 y.o.   MRN: [id number]   Chief Complaint: Medical Management of Chronic Issues    HPI:  1. Primary hypertension Does not check BP at home. Does try to watch salt in her diet. Denies chest pain, SOB, headaches. BP Readings from Last 3 Encounters:  03/20/20 129/65  03/19/20 (!) 151/74  12/19/19 123/70    2. Peripheral polyneuropathy She is having some burning in the bottom of her feet but says the neurontin does help.  3. Recurrent major depressive disorder, in partial remission (HCC) Takes amitriptyline primarily for sleep. No depression. PHQ9 SCORE ONLY 03/20/2020 12/19/2019 09/19/2019  PHQ-9 Total Score 0 0 0    4. Obesity (BMI 30-39.9) No significant changes in weight. Wt Readings from Last 3 Encounters:  03/20/20 197 lb (89.4 kg)  03/19/20 194 lb (88 kg)  12/19/19 200 lb (90.7 kg)   BMI Readings from Last 3 Encounters:  03/20/20 38.47 kg/m  03/19/20 37.89 kg/m  12/19/19 39.06 kg/m    5. Chronic midline thoracic back pain Pain assessment: Cause of pain- osteoarthritis Pain location- mid back Pain on scale of 1-10- 2-3/10 currently Frequency- daily What increases pain-standing What makes pain Better-rest Effects on ADL - none Any change in general medical condition-none  Current opioids rx- percocet 7.5/325 TID # meds rx- 90 Effectiveness of current meds-helps Adverse reactions from pain meds-none Morphine equivalent- 3375 MME  Pill count performed-No Last drug screen - 09/19/19 ( high risk q35m, moderate risk q71m, low risk yearly ) Urine drug screen today- No Was the [place] reviewed- yes  If yes were their any concerning findings? - no   Overdose risk: 1   Pain contract signed on:03/20/20   6. Peripheral edema Left leg is swollen but it stays that way   Outpatient Encounter Medications as of 03/20/2020  Medication Sig  . amitriptyline (ELAVIL) 25 MG tablet  Take 1 tablet (25 mg total) by mouth at bedtime.  . furosemide (LASIX) 20 MG tablet Take 1 tablet (20 mg total) by mouth daily.  Marland Kitchen gabapentin (NEURONTIN) 300 MG capsule Take 1 capsule (300 mg total) by mouth 3 (three) times daily.  Marland Kitchen lisinopril (ZESTRIL) 20 MG tablet Take 1 tablet (20 mg total) by mouth daily.  . meloxicam (MOBIC) 7.5 MG tablet Take 1 tablet (7.5 mg total) by mouth daily.  . methocarbamol (ROBAXIN) 500 MG tablet TAKE 1 TABLET BY MOUTH AT BEDTIME (Patient taking differently: Take 500 mg by mouth daily. )  . Multiple Vitamins-Minerals (PRESERVISION AREDS) CAPS Take 1 capsule by mouth in the morning and at bedtime.  Marland Kitchen oxyCODONE-acetaminophen (PERCOCET) 7.5-325 MG tablet Take 1 tablet by mouth every 8 (eight) hours as needed for severe pain. (Patient taking differently: Take 1 tablet by mouth in the morning, at noon, and at bedtime. )   No facility-administered encounter medications on file as of 03/20/2020.    Past Surgical History:  Procedure Laterality Date  . ABDOMINAL HYSTERECTOMY  11/02/10  dr Alycia Rossetti  @[facility]    TAH, BSO, incisional hernia repair , lysis of adhesions for endometrial cancer  . ANTERIOR LUMBAR FUSION  01-28-2008   dr elsner  @[facility]    L2 -- L4  . BACK SURGERY    . BUNIONECTOMY Right 2003  . CATARACT EXTRACTION W/ INTRAOCULAR LENS  IMPLANT, BILATERAL  2007  . COLONOSCOPY  last one 12-24-2018  . CYSTO/  LEFT RETROGRADE PYELOGRAM/ URETEROSCOPY STENT PLACEMENT/  OPEN  URETEROLYSIS WITH OMENTAL FLAP Left 03-11-2009    dr borden  @[facility]   . CYSTO/  [facility] SLING/  ANTERIOR REPAIR  12-06-2001    dr Jeffie Pollock @[facility]   . CYSTOSCOPY W/ URETERAL STENT PLACEMENT Left 12/07/2017   Procedure: CYSTOSCOPY WITH RETROGRADE PYELOGRAM/URETERAL STENT PLACEMENT;  Surgeon: Raynelle Bring, MD;  Location: [facility];  Service: Urology;  Laterality: Left;  . CYSTOSCOPY W/ URETERAL STENT PLACEMENT Left 03/29/2018   Procedure: CYSTOSCOPY WITH STENT  EXCHANGE;  Surgeon: Raynelle Bring, MD;  Location: [facility];   Service: Urology;  Laterality: Left;  . CYSTOSCOPY WITH RETROGRADE PYELOGRAM, URETEROSCOPY AND STENT PLACEMENT Left 12-29-2008   dr Alinda Money  @[facility]    WITH BALLOON DILATION LEFT URETERAL STRICTURE  . CYSTOSCOPY WITH RETROGRADE PYELOGRAM, URETEROSCOPY AND STENT PLACEMENT Left 09-12-2008    dr Jeffie Pollock @[facility]   . CYSTOSCOPY WITH STENT PLACEMENT Left 07/26/2018   Procedure: CYSTOSCOPY WITH STENT CHANGE;  Surgeon: Raynelle Bring, MD;  Location: [facility];  Service: Urology;  Laterality: Left;  . CYSTOSCOPY WITH STENT PLACEMENT Left 12/03/2018   Procedure: CYSTOSCOPY WITH STENT CHANGE;  Surgeon: Raynelle Bring, MD;  Location: [facility];  Service: Urology;  Laterality: Left;  . CYSTOSCOPY WITH STENT PLACEMENT Left 04/22/2019   Procedure: CYSTOSCOPY WITH STENT CHANGE;  Surgeon: Raynelle Bring, MD;  Location: [facility];  Service: Urology;  Laterality: Left;  . CYSTOSCOPY WITH STENT PLACEMENT Left 10/03/2019   Procedure: CYSTOSCOPY WITH STENT CHANGE;  Surgeon: Raynelle Bring, MD;  Location: [facility];  Service: Urology;  Laterality: Left;  . EYE SURGERY    . FOOT SURGERY Left 06-23-2010   dr Beola Cord   left first and second toes  . INSERTION OF MESH N/A 04/23/2013   Procedure: INSERTION OF MESH;  Surgeon: Adin Hector, MD;  Location: [facility];  Service: General;  Laterality: N/A;  . JOINT REPLACEMENT    . KNEE ARTHROSCOPY Right 04-27-2007  dr Shellia Carwin @[facility]   . LAPAROSCOPIC LYSIS OF ADHESIONS N/A 04/23/2013   Procedure: LAPAROSCOPIC LYSIS OF ADHESIONS;  Surgeon: Adin Hector, MD;  Location: [facility];  Service: General;  Laterality: N/A;  . [place] and 1999  . POSTERIOR LUMBAR FUSION  12/17/2012   L3 -- L5 laminectomy and L3 -- 5 fusion  . TOTAL HIP ARTHROPLASTY Left 07/04/2014   Procedure: LEFT TOTAL HIP ARTHROPLASTY ANTERIOR APPROACH;  Surgeon: Gearlean Alf, MD;  Location: [facility];  Service: Orthopedics;  Laterality: Left;  . TOTAL HIP ARTHROPLASTY Right 2009  . TOTAL KNEE  ARTHROPLASTY Right 09-15-2009   dr Shellia Carwin @[facility]   . VENTRAL HERNIA REPAIR N/A 07/19/2012   Procedure: LAPAROSCOPIC LYSIS OF ADHESIONS, SMALL BOWEL RESECTION, SEROSAL REPAIR, PRIMARY VENTRAL HERNIA REPAIR;  Surgeon: Adin Hector, MD;  Location: [facility];  Service: General;  Laterality: N/A;  . VENTRAL HERNIA REPAIR N/A 04/23/2013   Procedure: LAPAROSCOPIC exploration and repair of hernia in abdominal VENTRAL wall  HERNIA;  Surgeon: Adin Hector, MD;  Location: [facility];  Service: General;  Laterality: N/A;    Family History  Problem Relation Age of Onset  . Diabetes Mother   . Lung cancer Mother   . Diabetes Father   . Liver cancer Father   . Colon cancer Sister   . Diabetes Sister   . Seizures Daughter   . Diabetes Son   . Diabetes Sister   . Hypertension Sister   . Kidney disease Sister   . Breast cancer Neg Hx   . Allergic rhinitis Neg Hx   .  Angioedema Neg Hx   . Asthma Neg Hx   . Atopy Neg Hx   . Eczema Neg Hx   . Immunodeficiency Neg Hx   . Urticaria Neg Hx   . Colon polyps Neg Hx   . Esophageal cancer Neg Hx   . Rectal cancer Neg Hx   . Stomach cancer Neg Hx     New complaints: Would like to have an MRI to see what is going with her right side. Her right shoulder is shorter than the left and it is very uncomfortable. She is having surgery on Monday to have the stent in her left kidney replaced. She had bloodwork done yesterday but she did not have an A1C done. Would like to have her A1C checked today. She would also like to have a prescription for some nystatin ointment. She has used it in the past for yeast infections and it works well but she is almost out.  Social history: Son and daughter-in-law live with her.   Controlled substance contract: 03/20/20   Review of Systems  Constitutional: Negative.   HENT: Negative.   Eyes: Negative.   Respiratory: Negative.   Cardiovascular: Positive for leg swelling (left leg).  Gastrointestinal: Negative.    Genitourinary: Negative.   Musculoskeletal: Positive for back pain.  Skin: Negative.   Neurological: Negative.   Psychiatric/Behavioral: Negative.        Objective:   Physical Exam Vitals and nursing note reviewed.  Constitutional:      Appearance: Normal appearance.  HENT:     Head: Normocephalic.     Right Ear: Tympanic membrane normal.     Left Ear: Tympanic membrane normal.     Nose: Nose normal.     Mouth/Throat:     Mouth: Mucous membranes are moist.     Pharynx: Oropharynx is clear.  Eyes:     Conjunctiva/sclera: Conjunctivae normal.     Pupils: Pupils are equal, round, and reactive to light.  Cardiovascular:     Rate and Rhythm: Normal rate and regular rhythm.     Pulses: Normal pulses.     Heart sounds: Normal heart sounds.  Pulmonary:     Effort: Pulmonary effort is normal.     Breath sounds: Normal breath sounds.  Abdominal:     General: Bowel sounds are normal.     Palpations: Abdomen is soft.  Musculoskeletal:     Cervical back: Normal range of motion.     Left lower leg: Edema present.  Skin:    General: Skin is warm and dry.  Neurological:     General: No focal deficit present.     Mental Status: She is alert and oriented to person, place, and time.  Psychiatric:        Mood and Affect: Mood normal.        Behavior: Behavior normal.     BP 129/65   Pulse (!) 57   Temp 97.9 F (36.6 C) (Temporal)   Resp 20   Ht 5' (1.524 m)   Wt 197 lb (89.4 kg)   SpO2 97%   BMI 38.47 kg/m      Assessment & Plan:  [name] comes in today with chief complaint of Medical Management of Chronic Issues   Diagnosis and orders addressed:  1. Primary hypertension Low sodium diet - lisinopril (ZESTRIL) 20 MG tablet; Take 1 tablet (20 mg total) by mouth daily.  Dispense: 90 tablet; Refill: 1  2. Peripheral polyneuropathy Elevate legs when sitting -  Bayer DCA Hb A1c Waived  3. Recurrent major depressive disorder, in partial remission  ([place]) Stress management  4. Obesity (BMI 30-39.9) Discussed diet and exercise for person with BMI >25 Will recheck weight in 3-6 months  5. Chronic midline thoracic back pain Rest If doing something that increase back pain- stop - gabapentin (NEURONTIN) 300 MG capsule; Take 1 capsule (300 mg total) by mouth 3 (three) times daily.  Dispense: 90 capsule; Refill: 2 - amitriptyline (ELAVIL) 25 MG tablet; Take 1 tablet (25 mg total) by mouth at bedtime.  Dispense: 90 tablet; Refill: 1 - oxyCODONE-acetaminophen (PERCOCET) 7.5-325 MG tablet; Take 1 tablet by mouth every 8 (eight) hours as needed for severe pain.  Dispense: 90 tablet; Refill: 0 - oxyCODONE-acetaminophen (PERCOCET) 7.5-325 MG tablet; Take 1 tablet by mouth every 8 (eight) hours as needed for severe pain.  Dispense: 90 tablet; Refill: 0 - oxyCODONE-acetaminophen (PERCOCET) 7.5-325 MG tablet; Take 1 tablet by mouth every 8 (eight) hours as needed for severe pain.  Dispense: 90 tablet; Refill: 0  6. Chronic midline low back pain with sciatica, sciatica laterality unspecified - gabapentin (NEURONTIN) 300 MG capsule; Take 1 capsule (300 mg total) by mouth 3 (three) times daily.  Dispense: 90 capsule; Refill: 2  7. Peripheral edema - furosemide (LASIX) 20 MG tablet; Take 1 tablet (20 mg total) by mouth daily.  Dispense: 90 tablet; Refill: 1  * discussed MRI of back- no change in symptoms- discussed that insurance would not pay for MRI unless it was warranted.  Labs pending Health Maintenance reviewed Diet and exercise encouraged  Follow up plan: 3 months   Mary-Margaret Hassell Done, FNP

## 2020-03-20 NOTE — Patient Instructions (Signed)

## 2020-03-20 NOTE — Addendum Note (Signed)
Addended by: Chevis Pretty on: 03/20/2020 11:46 AM   Modules accepted: Orders

## 2020-03-23 ENCOUNTER — Ambulatory Visit (HOSPITAL_COMMUNITY)
Admission: RE | Admit: 2020-03-23 | Discharge: 2020-03-23 | Disposition: A | Discharge status: Home / Self Care | Payer: Medicare HMO | Attending: Urology | Admitting: Urology

## 2020-03-23 ENCOUNTER — Telehealth (HOSPITAL_COMMUNITY): Payer: Self-pay | Admitting: *Deleted

## 2020-03-23 ENCOUNTER — Other Ambulatory Visit: Payer: Self-pay

## 2020-03-23 ENCOUNTER — Ambulatory Visit (HOSPITAL_COMMUNITY): Payer: Medicare HMO | Admitting: Anesthesiology

## 2020-03-23 ENCOUNTER — Ambulatory Visit (HOSPITAL_COMMUNITY): Payer: Medicare HMO

## 2020-03-23 ENCOUNTER — Encounter (HOSPITAL_COMMUNITY)
Admission: RE | Disposition: A | Discharge status: Home / Self Care | Payer: Self-pay | Source: Home / Self Care | Attending: Urology

## 2020-03-23 ENCOUNTER — Encounter (HOSPITAL_COMMUNITY): Payer: Self-pay | Admitting: Urology

## 2020-03-23 DIAGNOSIS — N135 Crossing vessel and stricture of ureter without hydronephrosis: Secondary | ICD-10-CM | POA: Diagnosis not present

## 2020-03-23 DIAGNOSIS — N189 Chronic kidney disease, unspecified: Secondary | ICD-10-CM | POA: Diagnosis not present

## 2020-03-23 DIAGNOSIS — Z888 Allergy status to other drugs, medicaments and biological substances status: Secondary | ICD-10-CM | POA: Diagnosis not present

## 2020-03-23 DIAGNOSIS — E785 Hyperlipidemia, unspecified: Secondary | ICD-10-CM | POA: Diagnosis not present

## 2020-03-23 DIAGNOSIS — Z882 Allergy status to sulfonamides status: Secondary | ICD-10-CM | POA: Insufficient documentation

## 2020-03-23 DIAGNOSIS — I129 Hypertensive chronic kidney disease with stage 1 through stage 4 chronic kidney disease, or unspecified chronic kidney disease: Secondary | ICD-10-CM | POA: Diagnosis not present

## 2020-03-23 HISTORY — PX: CYSTOSCOPY WITH RETROGRADE PYELOGRAM, URETEROSCOPY AND STENT PLACEMENT: SHX5789

## 2020-03-23 SURGERY — CYSTOURETEROSCOPY, WITH RETROGRADE PYELOGRAM AND STENT INSERTION
Anesthesia: General | Laterality: Left

## 2020-03-23 MED ORDER — DEXAMETHASONE SODIUM PHOSPHATE 4 MG/ML IJ SOLN
INTRAMUSCULAR | Status: DC | PRN
  Administered 2020-03-23: 4 mg via INTRAVENOUS

## 2020-03-23 MED ORDER — LIDOCAINE 2% (20 MG/ML) 5 ML SYRINGE
INTRAMUSCULAR | Status: DC | PRN
  Administered 2020-03-23: 60 mg via INTRAVENOUS

## 2020-03-23 MED ORDER — FENTANYL CITRATE (PF) 100 MCG/2ML IJ SOLN
INTRAMUSCULAR | Status: AC
  Filled 2020-03-23: qty 2

## 2020-03-23 MED ORDER — SODIUM CHLORIDE 0.9 % IR SOLN
Status: DC | PRN
  Administered 2020-03-23: 3000 mL

## 2020-03-23 MED ORDER — SODIUM CHLORIDE 0.9 % IV SOLN
2.0000 g | INTRAVENOUS | Status: AC
  Administered 2020-03-23: 2 g via INTRAVENOUS
  Filled 2020-03-23: qty 20

## 2020-03-23 MED ORDER — IOHEXOL 300 MG/ML  SOLN
INTRAMUSCULAR | Status: DC | PRN
  Administered 2020-03-23: 27 mL

## 2020-03-23 MED ORDER — PROPOFOL 10 MG/ML IV BOLUS
INTRAVENOUS | Status: AC
  Filled 2020-03-23: qty 20

## 2020-03-23 MED ORDER — PROPOFOL 10 MG/ML IV BOLUS
INTRAVENOUS | Status: DC | PRN
  Administered 2020-03-23: 150 mg via INTRAVENOUS

## 2020-03-23 MED ORDER — CHLORHEXIDINE GLUCONATE 0.12 % MT SOLN
15.0000 mL | Freq: Once | OROMUCOSAL | Status: AC
  Administered 2020-03-23: 15 mL via OROMUCOSAL

## 2020-03-23 MED ORDER — ONDANSETRON HCL 4 MG/2ML IJ SOLN
INTRAMUSCULAR | Status: DC | PRN
  Administered 2020-03-23: 4 mg via INTRAVENOUS

## 2020-03-23 MED ORDER — FENTANYL CITRATE (PF) 100 MCG/2ML IJ SOLN
INTRAMUSCULAR | Status: DC | PRN
  Administered 2020-03-23: 50 ug via INTRAVENOUS

## 2020-03-23 MED ORDER — LACTATED RINGERS IV SOLN: INTRAVENOUS | Status: DC

## 2020-03-23 MED ORDER — ORAL CARE MOUTH RINSE: 15.0000 mL | Freq: Once | OROMUCOSAL | Status: AC

## 2020-03-23 MED ORDER — PHENYLEPHRINE 40 MCG/ML (10ML) SYRINGE FOR IV PUSH (FOR BLOOD PRESSURE SUPPORT)
PREFILLED_SYRINGE | INTRAVENOUS | Status: DC | PRN
  Administered 2020-03-23 (×4): 80 ug via INTRAVENOUS

## 2020-03-23 SURGICAL SUPPLY — 20 items
BAG URO CATCHER STRL LF (MISCELLANEOUS) ×2 IMPLANT
BASKET ZERO TIP NITINOL 2.4FR (BASKET) IMPLANT
BSKT STON RTRVL ZERO TP 2.4FR (BASKET)
CATH INTERMIT  6FR 70CM (CATHETERS) ×1 IMPLANT
CLOTH BEACON ORANGE TIMEOUT ST (SAFETY) ×2 IMPLANT
GLOVE BIOGEL M STRL SZ7.5 (GLOVE) ×2 IMPLANT
GOWN STRL REUS W/TWL LRG LVL3 (GOWN DISPOSABLE) ×2 IMPLANT
GUIDEWIRE STR DUAL SENSOR (WIRE) ×2 IMPLANT
GUIDEWIRE ZIPWRE .038 STRAIGHT (WIRE) IMPLANT
IV NS 1000ML (IV SOLUTION)
IV NS 1000ML BAXH (IV SOLUTION) ×1 IMPLANT
KIT TURNOVER KIT A (KITS) IMPLANT
LASER FIB FLEXIVA PULSE ID 365 (Laser) IMPLANT
MANIFOLD NEPTUNE II (INSTRUMENTS) ×2 IMPLANT
PACK CYSTO (CUSTOM PROCEDURE TRAY) ×2 IMPLANT
SHEATH URETERAL 12FRX35CM (MISCELLANEOUS) IMPLANT
TRACTIP FLEXIVA PULS ID 200XHI (Laser) IMPLANT
TRACTIP FLEXIVA PULSE ID 200 (Laser)
TUBING CONNECTING 10 (TUBING) ×2 IMPLANT
TUBING UROLOGY SET (TUBING) ×2 IMPLANT

## 2020-03-23 NOTE — Transfer of Care (Signed)
Immediate Anesthesia Transfer of Care Note  Patient: [name]  Procedure(s) Performed: CYSTOSCOPY WITH LEFT RETROGRADE PYELOGRAM, AND LEFT STENT REMOVAL (Left )  Patient Location: PACU  Anesthesia Type:General  Level of Consciousness: awake, alert  and oriented  Airway & Oxygen Therapy: Patient Spontanous Breathing and Patient connected to face mask oxygen  Post-op Assessment: Report given to RN and Post -op Vital signs reviewed and stable  Post vital signs: Reviewed and stable  Last Vitals:  Vitals Value Taken Time  BP    Temp    Pulse 58 03/23/20 1405  Resp 14 03/23/20 1405  SpO2 100 % 03/23/20 1405  Vitals shown include unvalidated device data.  Last Pain:  Vitals:   03/23/20 1216  TempSrc:   PainSc: 0-No pain         Complications: No complications documented.

## 2020-03-23 NOTE — Anesthesia Postprocedure Evaluation (Signed)
Anesthesia Post Note  Patient: Etherine Mackowiak Ly  Procedure(s) Performed: CYSTOSCOPY WITH LEFT RETROGRADE PYELOGRAM, AND LEFT STENT REMOVAL (Left )     Patient location during evaluation: PACU Anesthesia Type: General Level of consciousness: awake and alert Pain management: pain level controlled Vital Signs Assessment: post-procedure vital signs reviewed and stable Respiratory status: spontaneous breathing, nonlabored ventilation, respiratory function stable and patient connected to nasal cannula oxygen Cardiovascular status: blood pressure returned to baseline and stable Postop Assessment: no apparent nausea or vomiting Anesthetic complications: no   No complications documented.  Last Vitals:  Vitals:   03/23/20 1415 03/23/20 1430  BP: 121/90 134/60  Pulse: (!) 55 (!) 58  Resp: 14 16  Temp:  36.5 C  SpO2: 100% 95%    Last Pain:  Vitals:   03/23/20 1430  TempSrc:   PainSc: 0-No pain                 Effie Berkshire

## 2020-03-23 NOTE — Discharge Instructions (Signed)
1. You may see some blood in the urine and may have some burning with urination for 48-72 hours. You also may notice that you have to urinate more frequently or urgently after your procedure which is normal.  °2. You should call should you develop an inability urinate, fever > 101, persistent nausea and vomiting that prevents you from eating or drinking to stay hydrated.  °

## 2020-03-23 NOTE — Interval H&P Note (Signed)
History and Physical Interval Note:  03/23/2020 1:00 PM  [name]  has presented today for surgery, with the diagnosis of LEFT URETERAL OBSTRUCTION.  The various methods of treatment have been discussed with the patient and family. After consideration of risks, benefits and other options for treatment, the patient has consented to  Procedure(s): CYSTOSCOPY WITH LEFT RETROGRADE PYELOGRAM, POSSIBLE URETEROSCOPY AND LEFT STENT REMOVAL VS EXCHANGE (Left) as a surgical intervention.  The patient's history has been reviewed, patient examined, no change in status, stable for surgery.  I have reviewed the patient's chart and labs.  Questions were answered to the patient's satisfaction.     [employer]

## 2020-03-23 NOTE — Op Note (Signed)
Preoperative diagnosis:  1. Partial left ureteral obstruction   Postoperative diagnosis:  1. Partial left ureteral obstruction   Procedure:  1. Cystoscopy 2. Left retrograde pyelography with interpretation 3. Left ureteral stent removal  Surgeon: Pryor Curia. M.D.  Anesthesia: General  Complications: None  Intraoperative findings: Left retrograde pyelography was performed through a 6 French ureteral catheter with Omnipaque contrast.  This revealed a normal distal ureter.  Proximal ureter was noted to be somewhat tortuous and very slightly stenotic.  The renal collecting system was dilated as it has been chronically.  No filling defects were noted.  Upon monitoring the renal collecting system and ureter, there did appear to be adequate clearance of contrast after stent removal.    Indwelling stent that had been present was noted to be severely encrusted.  EBL: Minimal  Specimens: None  Indication: [name] is a 74 y.o. patient with left ureteral obstruction and recurrent pyelonephritis. After reviewing the management options for treatment, he elected to proceed with the above surgical procedure(s). We have discussed the potential benefits and risks of the procedure, side effects of the proposed treatment, the likelihood of the patient achieving the goals of the procedure, and any potential problems that might occur during the procedure or recuperation. Informed consent has been obtained.  Description of procedure:  The patient was taken to the operating room and general anesthesia was induced.  The patient was placed in the dorsal lithotomy position, prepped and draped in the usual sterile fashion, and preoperative antibiotics were administered. A preoperative time-out was performed.   Cystourethroscopy was performed with a 22 French cystoscope sheath.  The indwelling stent was identified and was noted to be severely encrusted.  Using flexible graspers, this was  brought out to the urethra.  A wire could not be placed through the stent to the encrustation and it was therefore removed completely.  The 6 French ureteral catheter was then used to perform a left retrograde pyelogram with Omnipaque contrast.  Findings are as dictated above.  After monitoring the patient's renal collecting system and ureter, there did appear to be adequate drainage of contrast.  This was again consistent with the patient's history of partial obstruction.  Considering the patient's wishes, the stent was left out and she will plan to follow-up with a Lasix renal scan.  Should she have evidence of recurrent obstruction based on symptoms, radiographic imaging, or recurrent episodes of infection, we will plan to return to stent management.  She tolerated the procedure well and without complications.  She was able to be awakened and transferred to the recovery unit in satisfactory condition.  Pryor Curia MD

## 2020-03-23 NOTE — Anesthesia Procedure Notes (Signed)
Procedure Name: LMA Insertion Date/Time: 03/23/2020 1:34 PM Performed by: Eulas Post, Majesty Stehlin W, CRNA Pre-anesthesia Checklist: Patient identified, Emergency Drugs available, Suction available and Patient being monitored Patient Re-evaluated:Patient Re-evaluated prior to induction Oxygen Delivery Method: Circle system utilized Preoxygenation: Pre-oxygenation with 100% oxygen Induction Type: IV induction Ventilation: Mask ventilation without difficulty LMA: LMA inserted LMA Size: 4.0 Number of attempts: 1 Placement Confirmation: positive ETCO2 and breath sounds checked- equal and bilateral Tube secured with: Tape Dental Injury: Teeth and Oropharynx as per pre-operative assessment

## 2020-03-23 NOTE — Anesthesia Preprocedure Evaluation (Signed)
Anesthesia Evaluation  Patient identified by MRN, date of birth, ID band Patient awake    Reviewed: Allergy & Precautions, NPO status , Patient's Chart, lab work & pertinent test results  Airway Mallampati: I  TM Distance: >3 FB Neck ROM: Full    Dental  (+) Edentulous Upper, Edentulous Lower   Pulmonary    breath sounds clear to auscultation       Cardiovascular hypertension, Pt. on medications  Rhythm:Regular Rate:Normal     Neuro/Psych PSYCHIATRIC DISORDERS Depression    GI/Hepatic negative GI ROS, Neg liver ROS,   Endo/Other  negative endocrine ROS  Renal/GU Renal disease     Musculoskeletal  (+) Arthritis , Osteoarthritis,    Abdominal Normal abdominal exam  (+)   Peds  Hematology negative hematology ROS (+)   Anesthesia Other Findings - HLD  Reproductive/Obstetrics                             Anesthesia Physical Anesthesia Plan  ASA: II  Anesthesia Plan: General   Post-op Pain Management:    Induction: Intravenous  PONV Risk Score and Plan: 4 or greater and Ondansetron, Dexamethasone and Treatment may vary due to age or medical condition  Airway Management Planned: LMA  Additional Equipment: None  Intra-op Plan:   Post-operative Plan: Extubation in OR  Informed Consent: I have reviewed the patients History and Physical, chart, labs and discussed the procedure including the risks, benefits and alternatives for the proposed anesthesia with the patient or authorized representative who has indicated his/her understanding and acceptance.     Dental advisory given  Plan Discussed with: CRNA  Anesthesia Plan Comments:         Anesthesia Quick Evaluation

## 2020-03-24 ENCOUNTER — Encounter (HOSPITAL_COMMUNITY): Payer: Self-pay | Admitting: Urology

## 2020-05-26 DIAGNOSIS — M7061 Trochanteric bursitis, right hip: Secondary | ICD-10-CM | POA: Diagnosis not present

## 2020-06-01 ENCOUNTER — Other Ambulatory Visit: Payer: Self-pay | Admitting: Nurse Practitioner

## 2020-06-01 DIAGNOSIS — G8929 Other chronic pain: Secondary | ICD-10-CM

## 2020-06-10 ENCOUNTER — Other Ambulatory Visit: Payer: Self-pay | Admitting: Nurse Practitioner

## 2020-06-10 DIAGNOSIS — M25551 Pain in right hip: Secondary | ICD-10-CM | POA: Diagnosis not present

## 2020-06-10 DIAGNOSIS — Z96641 Presence of right artificial hip joint: Secondary | ICD-10-CM | POA: Diagnosis not present

## 2020-06-10 DIAGNOSIS — G8929 Other chronic pain: Secondary | ICD-10-CM

## 2020-06-16 ENCOUNTER — Other Ambulatory Visit: Payer: Self-pay

## 2020-06-16 ENCOUNTER — Ambulatory Visit (INDEPENDENT_AMBULATORY_CARE_PROVIDER_SITE_OTHER): Payer: Medicare HMO | Admitting: Nurse Practitioner

## 2020-06-16 ENCOUNTER — Encounter: Payer: Self-pay | Admitting: Nurse Practitioner

## 2020-06-16 VITALS — BP 127/67 | HR 74 | Temp 97.5°F | Resp 20 | Ht 60.0 in | Wt 199.0 lb

## 2020-06-16 DIAGNOSIS — E669 Obesity, unspecified: Secondary | ICD-10-CM | POA: Diagnosis not present

## 2020-06-16 DIAGNOSIS — G629 Polyneuropathy, unspecified: Secondary | ICD-10-CM

## 2020-06-16 DIAGNOSIS — R609 Edema, unspecified: Secondary | ICD-10-CM

## 2020-06-16 DIAGNOSIS — M546 Pain in thoracic spine: Secondary | ICD-10-CM

## 2020-06-16 DIAGNOSIS — I10 Essential (primary) hypertension: Secondary | ICD-10-CM

## 2020-06-16 DIAGNOSIS — R69 Illness, unspecified: Secondary | ICD-10-CM | POA: Diagnosis not present

## 2020-06-16 DIAGNOSIS — D122 Benign neoplasm of ascending colon: Secondary | ICD-10-CM | POA: Diagnosis not present

## 2020-06-16 DIAGNOSIS — F3341 Major depressive disorder, recurrent, in partial remission: Secondary | ICD-10-CM

## 2020-06-16 DIAGNOSIS — R600 Localized edema: Secondary | ICD-10-CM

## 2020-06-16 DIAGNOSIS — M5440 Lumbago with sciatica, unspecified side: Secondary | ICD-10-CM

## 2020-06-16 DIAGNOSIS — G8929 Other chronic pain: Secondary | ICD-10-CM | POA: Diagnosis not present

## 2020-06-16 MED ORDER — AMITRIPTYLINE HCL 25 MG PO TABS: 25.0000 mg | ORAL_TABLET | Freq: Every day | ORAL | 1 refills | Status: DC

## 2020-06-16 MED ORDER — OXYCODONE-ACETAMINOPHEN 7.5-325 MG PO TABS: 1.0000 | ORAL_TABLET | Freq: Three times a day (TID) | ORAL | 0 refills | Status: AC | PRN

## 2020-06-16 MED ORDER — OXYCODONE-ACETAMINOPHEN 7.5-325 MG PO TABS: 1.0000 | ORAL_TABLET | Freq: Three times a day (TID) | ORAL | 0 refills | Status: DC | PRN

## 2020-06-16 MED ORDER — GABAPENTIN 300 MG PO CAPS: 300.0000 mg | ORAL_CAPSULE | Freq: Three times a day (TID) | ORAL | 2 refills | Status: DC

## 2020-06-16 NOTE — Patient Instructions (Signed)
Chronic Back Pain When back pain lasts longer than 3 months, it is called chronic back pain. Pain may get worse at certain times (flare-ups). There are things you can do at home to manage your pain. Follow these instructions at home: Pay attention to any changes in your symptoms. Take these actions to help with your pain: Managing pain and stiffness  If told, put ice on the painful area. Your doctor may tell you to use ice for 24-48 hours after the flare-up starts. To do this: ? Put ice in a plastic bag. ? Place a towel between your skin and the bag. ? Leave the ice on for 20 minutes, 2-3 times a day.  If told, put heat on the painful area. Do this as often as told by your doctor. Use the heat source that your doctor recommends, such as a moist heat pack or a heating pad. ? Place a towel between your skin and the heat source. ? Leave the heat on for 20-30 minutes. ? Take off the heat if your skin turns bright red. This is especially important if you are unable to feel pain, heat, or cold. You may have a greater risk of getting burned.  Soak in a warm bath. This can help relieve pain.      Activity  Avoid bending and other activities that make pain worse.  When standing: ? Keep your upper back and neck straight. ? Keep your shoulders pulled back. ? Avoid slouching.  When sitting: ? Keep your back straight. ? Relax your shoulders. Do not round your shoulders or pull them backward.  Do not sit or stand in one place for long periods of time.  Take short rest breaks during the day. Lying down or standing is usually better than sitting. Resting can help relieve pain.  When sitting or lying down for a long time, do some mild activity or stretching. This will help to prevent stiffness and pain.  Get regular exercise. Ask your doctor what activities are safe for you.  Do not lift anything that is heavier than 10 lb (4.5 kg) or the limit that you are told, until your doctor says that it  is safe.  To prevent injury when you lift things: ? Bend your knees. ? Keep the weight close to your body. ? Avoid twisting.  Sleep on a firm mattress. Try lying on your side with your knees slightly bent. If you lie on your back, put a pillow under your knees.   Medicines  Treatment may include medicines for pain and swelling taken by mouth or put on the skin, prescription pain medicine, or muscle relaxants.  Take over-the-counter and prescription medicines only as told by your doctor.  Ask your doctor if the medicine prescribed to you: ? Requires you to avoid driving or using machinery. ? Can cause trouble pooping (constipation). You may need to take these actions to prevent or treat trouble pooping:  Drink enough fluid to keep your pee (urine) pale yellow.  Take over-the-counter or prescription medicines.  Eat foods that are high in fiber. These include beans, whole grains, and fresh fruits and vegetables.  Limit foods that are high in fat and sugars. These include fried or sweet foods. General instructions  Do not use any products that contain nicotine or tobacco, such as cigarettes, e-cigarettes, and chewing tobacco. If you need help quitting, ask your doctor.  Keep all follow-up visits as told by your doctor. This is important. Contact a doctor if:    Your pain does not get better with rest or medicine.  Your pain gets worse, or you have new pain.  You have a high fever.  You lose weight very quickly.  You have trouble doing your normal activities. Get help right away if:  One or both of your legs or feet feel weak.  One or both of your legs or feet lose feeling (have numbness).  You have trouble controlling when you poop (have a bowel movement) or pee (urinate).  You have bad back pain and: ? You feel like you may vomit (nauseous), or you vomit. ? You have pain in your belly (abdomen). ? You have shortness of breath. ? You faint. Summary  When back pain  lasts longer than 3 months, it is called chronic back pain.  Pain may get worse at certain times (flare-ups).  Use ice and heat as told by your doctor. Your doctor may tell you to use ice after flare-ups. This information is not intended to replace advice given to you by your health care provider. Make sure you discuss any questions you have with your health care provider. Document Revised: 06/05/2019 Document Reviewed: 06/05/2019 Elsevier Patient Education  2021 [employer] Inc.  

## 2020-06-16 NOTE — Progress Notes (Signed)
Subjective:    Patient ID: [name], female    DOB: 06/29/1945, 75 y.o.   MRN: [id number]   Chief Complaint: medical management of chronic issues     HPI:  1. Primary hypertension No c/o chest pain, sob or headache. Does not check blood pressure at home. BP Readings from Last 3 Encounters:  03/23/20 130/60  03/20/20 129/65  03/19/20 (!) 151/74     2. Adenomatous polyp of ascending colon last colonoscopy 12/24/18. They found several large polyps. According to chart she should have already repeated this. Patient will contact them about repeating.  3. Peripheral polyneuropathy Numbness and tingling of bil hands and feet.  4. Chronic midline thoracic back pain Pain assessment: Cause of pain- DDD Pain location- thoracic mainly. Hs had some right hip pain- may need hip replacement Pain on scale of 1-10- 3/10 currently Frequency- daily What increases pain-to much standing or walking What makes pain Better-rest Effects on ADL - none Any change in general medical condition-none  Current opioids rx- percocet 7.5/325 TID and gabapentin which helps # meds rx- 90 Effectiveness of current meds-helps Adverse reactions from pain meds-none Morphine equivalent- 30MME  Pill count performed-No Last drug screen - 09/19/19 ( high risk q76m, moderate risk q60m, low risk yearly ) Urine drug screen today- No Was the [place] reviewed- yes  If yes were their any concerning findings? - no   Overdose risk: 1  Pain contract signed on: 03/27/20   5. Recurrent major depressive disorder, in partial remission ([place]) currently on no meds. Says sh eis doing well. Depression screen [facility] 2/9 06/16/2020 03/20/2020 12/19/2019  Decreased Interest 0 0 0  Down, Depressed, Hopeless 0 0 0  PHQ - 2 Score 0 0 0  Altered sleeping - 0 -  Tired, decreased energy - 0 -  Change in appetite - 0 -  Feeling bad or failure about yourself  - 0 -  Trouble concentrating - 0 -  Moving slowly or  fidgety/restless - 0 -  Suicidal thoughts - 0 -  PHQ-9 Score - 0 -  Difficult doing work/chores - Not difficult at all -  Some recent data might be hidden     6. Peripheral edema Has occasionally. Usually in her left foot. She takes lasix daily  7. Obesity (BMI 30-39.9) Weight is up 4lbs form previous visit Wt Readings from Last 3 Encounters:  06/16/20 199 lb (90.3 kg)  03/23/20 195 lb (88.5 kg)  03/20/20 197 lb (89.4 kg)   BMI Readings from Last 3 Encounters:  06/16/20 38.86 kg/m  03/23/20 38.08 kg/m  03/20/20 38.47 kg/m       Outpatient Encounter Medications as of 06/16/2020  Medication Sig  . amitriptyline (ELAVIL) 25 MG tablet TAKE 1 TABLET BY MOUTH AT BEDTIME  . furosemide (LASIX) 20 MG tablet Take 1 tablet (20 mg total) by mouth daily.  Marland Kitchen gabapentin (NEURONTIN) 300 MG capsule Take 1 capsule (300 mg total) by mouth 3 (three) times daily.  Marland Kitchen lisinopril (ZESTRIL) 20 MG tablet Take 1 tablet (20 mg total) by mouth daily.  . meloxicam (MOBIC) 7.5 MG tablet Take 1 tablet (7.5 mg total) by mouth daily.  . methocarbamol (ROBAXIN) 500 MG tablet TAKE 1 TABLET BY MOUTH AT BEDTIME  . Multiple Vitamins-Minerals (PRESERVISION AREDS) CAPS Take 1 capsule by mouth in the morning and at bedtime.  Marland Kitchen nystatin cream (MYCOSTATIN) Apply 1 application topically 2 (two) times daily.  Marland Kitchen oxyCODONE-acetaminophen (PERCOCET) 7.5-325 MG tablet Take 1 tablet by  mouth every 8 (eight) hours as needed for severe pain.   No facility-administered encounter medications on file as of 06/16/2020.    Past Surgical History:  Procedure Laterality Date  . ABDOMINAL HYSTERECTOMY  11/02/10  dr Alycia Rossetti  @[facility]    TAH, BSO, incisional hernia repair , lysis of adhesions for endometrial cancer  . ANTERIOR LUMBAR FUSION  01-28-2008   dr elsner  @[facility]    L2 -- L4  . BACK SURGERY    . BUNIONECTOMY Right 2003  . CATARACT EXTRACTION W/ INTRAOCULAR LENS  IMPLANT, BILATERAL  2007  . COLONOSCOPY  last one 12-24-2018  .  CYSTO/  LEFT RETROGRADE PYELOGRAM/ URETEROSCOPY STENT PLACEMENT/  OPEN URETEROLYSIS WITH OMENTAL FLAP Left 03-11-2009    dr borden  @[facility]   . CYSTO/  [facility]/  ANTERIOR REPAIR  12-06-2001    dr Jeffie Pollock @[facility]   . CYSTOSCOPY W/ URETERAL STENT PLACEMENT Left 12/07/2017   Procedure: CYSTOSCOPY WITH RETROGRADE PYELOGRAM/URETERAL STENT PLACEMENT;  Surgeon: Raynelle Bring, MD;  Location: [facility];  Service: Urology;  Laterality: Left;  . CYSTOSCOPY W/ URETERAL STENT PLACEMENT Left 03/29/2018   Procedure: CYSTOSCOPY WITH STENT  EXCHANGE;  Surgeon: Raynelle Bring, MD;  Location: [facility];  Service: Urology;  Laterality: Left;  . CYSTOSCOPY WITH RETROGRADE PYELOGRAM, URETEROSCOPY AND STENT PLACEMENT Left 12-29-2008   dr Alinda Money  @[facility]    WITH BALLOON DILATION LEFT URETERAL STRICTURE  . CYSTOSCOPY WITH RETROGRADE PYELOGRAM, URETEROSCOPY AND STENT PLACEMENT Left 09-12-2008    dr Jeffie Pollock @[facility]   . CYSTOSCOPY WITH RETROGRADE PYELOGRAM, URETEROSCOPY AND STENT PLACEMENT Left 03/23/2020   Procedure: CYSTOSCOPY WITH LEFT RETROGRADE PYELOGRAM, AND LEFT STENT REMOVAL;  Surgeon: Raynelle Bring, MD;  Location: [facility];  Service: Urology;  Laterality: Left;  . CYSTOSCOPY WITH STENT PLACEMENT Left 07/26/2018   Procedure: CYSTOSCOPY WITH STENT CHANGE;  Surgeon: Raynelle Bring, MD;  Location: [facility];  Service: Urology;  Laterality: Left;  . CYSTOSCOPY WITH STENT PLACEMENT Left 12/03/2018   Procedure: CYSTOSCOPY WITH STENT CHANGE;  Surgeon: Raynelle Bring, MD;  Location: [facility];  Service: Urology;  Laterality: Left;  . CYSTOSCOPY WITH STENT PLACEMENT Left 04/22/2019   Procedure: CYSTOSCOPY WITH STENT CHANGE;  Surgeon: Raynelle Bring, MD;  Location: [facility];  Service: Urology;  Laterality: Left;  . CYSTOSCOPY WITH STENT PLACEMENT Left 10/03/2019   Procedure: CYSTOSCOPY WITH STENT CHANGE;  Surgeon: Raynelle Bring, MD;  Location: [facility];  Service: Urology;  Laterality: Left;  . EYE SURGERY    . FOOT SURGERY Left 06-23-2010    dr Beola Cord   left first and second toes  . INSERTION OF MESH N/A 04/23/2013   Procedure: INSERTION OF MESH;  Surgeon: Adin Hector, MD;  Location: [facility];  Service: General;  Laterality: N/A;  . JOINT REPLACEMENT    . KNEE ARTHROSCOPY Right 04-27-2007  dr Shellia Carwin @[facility]   . LAPAROSCOPIC LYSIS OF ADHESIONS N/A 04/23/2013   Procedure: LAPAROSCOPIC LYSIS OF ADHESIONS;  Surgeon: Adin Hector, MD;  Location: [facility];  Service: General;  Laterality: N/A;  . [place] and 1999  . POSTERIOR LUMBAR FUSION  12/17/2012   L3 -- L5 laminectomy and L3 -- 5 fusion  . TOTAL HIP ARTHROPLASTY Left 07/04/2014   Procedure: LEFT TOTAL HIP ARTHROPLASTY ANTERIOR APPROACH;  Surgeon: Gearlean Alf, MD;  Location: [place];  Service: Orthopedics;  Laterality: Left;  . TOTAL HIP ARTHROPLASTY Right 2009  . TOTAL KNEE ARTHROPLASTY Right 09-15-2009   dr Shellia Carwin @[facility]   . [place]  N/A 07/19/2012   Procedure: LAPAROSCOPIC LYSIS OF ADHESIONS, SMALL BOWEL RESECTION, SEROSAL REPAIR, PRIMARY VENTRAL HERNIA REPAIR;  Surgeon: Adin Hector, MD;  Location: [facility];  Service: General;  Laterality: N/A;  . VENTRAL HERNIA REPAIR N/A 04/23/2013   Procedure: LAPAROSCOPIC exploration and repair of hernia in abdominal VENTRAL wall  HERNIA;  Surgeon: Adin Hector, MD;  Location: [facility];  Service: General;  Laterality: N/A;    Family History  Problem Relation Age of Onset  . Diabetes Mother   . Lung cancer Mother   . Diabetes Father   . Liver cancer Father   . Colon cancer Sister   . Diabetes Sister   . Seizures Daughter   . Diabetes Son   . Diabetes Sister   . Hypertension Sister   . Kidney disease Sister   . Breast cancer Neg Hx   . Allergic rhinitis Neg Hx   . Angioedema Neg Hx   . Asthma Neg Hx   . Atopy Neg Hx   . Eczema Neg Hx   . Immunodeficiency Neg Hx   . Urticaria Neg Hx   . Colon polyps Neg Hx   . Esophageal cancer Neg Hx   . Rectal cancer Neg Hx   . Stomach cancer Neg Hx      New complaints: None   Social history: Lives with her son and wife  Controlled substance contract: none    Review of Systems  Constitutional: Negative for diaphoresis.  Eyes: Negative for pain.  Respiratory: Negative for shortness of breath.   Cardiovascular: Negative for chest pain, palpitations and leg swelling.  Gastrointestinal: Negative for abdominal pain.  Endocrine: Negative for polydipsia.  Skin: Negative for rash.  Neurological: Negative for dizziness, weakness and headaches.  Hematological: Does not bruise/bleed easily.  All other systems reviewed and are negative.      Objective:   Physical Exam Vitals and nursing note reviewed.  Constitutional:      General: She is not in acute distress.    Appearance: Normal appearance. She is well-developed and well-nourished.  HENT:     Head: Normocephalic.     Nose: Nose normal.     Mouth/Throat:     Mouth: Oropharynx is clear and moist.  Eyes:     Extraocular Movements: EOM normal.     Pupils: Pupils are equal, round, and reactive to light.  Neck:     Vascular: No carotid bruit or JVD.  Cardiovascular:     Rate and Rhythm: Normal rate and regular rhythm.     Pulses: Intact distal pulses.     Heart sounds: Normal heart sounds.  Pulmonary:     Effort: Pulmonary effort is normal. No respiratory distress.     Breath sounds: Normal breath sounds. No wheezing or rales.  Chest:     Chest wall: No tenderness.  Abdominal:     General: Bowel sounds are normal. There is no distension or abdominal bruit. Aorta is normal.     Palpations: Abdomen is soft. There is no hepatomegaly, splenomegaly, mass or pulsatile mass.     Tenderness: There is no abdominal tenderness.  Musculoskeletal:        General: No edema. Normal range of motion.     Cervical back: Normal range of motion and neck supple.     Comments: Rises slowly from siting to standing. Pain in thoracic spine with twisting.  Lymphadenopathy:     Cervical: No  cervical adenopathy.  Skin:    General: Skin is  warm and dry.  Neurological:     Mental Status: She is alert and oriented to person, place, and time.     Deep Tendon Reflexes: Reflexes are normal and symmetric.  Psychiatric:        Mood and Affect: Mood and affect normal.        Behavior: Behavior normal.        Thought Content: Thought content normal.        Judgment: Judgment normal.    BP 127/67   Pulse 74   Temp (!) 97.5 F (36.4 C) (Temporal)   Resp 20   Ht 5' (1.524 m)   Wt 199 lb (90.3 kg)   SpO2 93%   BMI 38.86 kg/m         Assessment & Plan:  [name] comes in today with chief complaint of Medical Management of Chronic Issues   Diagnosis and orders addressed:  1. Primary hypertension Low sodium diet  2. Adenomatous polyp of ascending colon Call GI and schedule repeat colonoscopy  3. Peripheral polyneuropathy Do nt go barefooted  4. Chronic midline thoracic back pain Moist heat rest - oxyCODONE-acetaminophen (PERCOCET) 7.5-325 MG tablet; Take 1 tablet by mouth every 8 (eight) hours as needed for severe pain.  Dispense: 90 tablet; Refill: 0 - oxyCODONE-acetaminophen (PERCOCET) 7.5-325 MG tablet; Take 1 tablet by mouth every 8 (eight) hours as needed for severe pain.  Dispense: 90 tablet; Refill: 0 - oxyCODONE-acetaminophen (PERCOCET) 7.5-325 MG tablet; Take 1 tablet by mouth every 8 (eight) hours as needed for severe pain.  Dispense: 90 tablet; Refill: 0 - gabapentin (NEURONTIN) 300 MG capsule; Take 1 capsule (300 mg total) by mouth 3 (three) times daily.  Dispense: 90 capsule; Refill: 2 - amitriptyline (ELAVIL) 25 MG tablet; Take 1 tablet (25 mg total) by mouth at bedtime.  Dispense: 90 tablet; Refill: 1  5. Recurrent major depressive disorder, in partial remission ([name]) Stress management  6. Peripheral edema Continue lasix. elevate legs when sitting  7. Obesity (BMI 30-39.9) Discussed diet and exercise for person with BMI >25 Will  recheck weight in 3-6 months    Labs pending Health Maintenance reviewed Diet and exercise encouraged  Follow up plan: 3 months   Mary-Margaret Hassell Done, FNP

## 2020-06-17 LAB — CMP14+EGFR
ALT: 14 IU/L (ref 0–32)
AST: 22 IU/L (ref 0–40)
Albumin/Globulin Ratio: 1.7 (ref 1.2–2.2)
Albumin: 4.3 g/dL (ref 3.7–4.7)
Alkaline Phosphatase: 125 IU/L — ABNORMAL HIGH (ref 44–121)
BUN/Creatinine Ratio: 17 (ref 12–28)
BUN: 21 mg/dL (ref 8–27)
Bilirubin Total: 0.6 mg/dL (ref 0.0–1.2)
CO2: 24 mmol/L (ref 20–29)
Calcium: 9.8 mg/dL (ref 8.7–10.3)
Chloride: 101 mmol/L (ref 96–106)
Creatinine, Ser: 1.23 mg/dL — ABNORMAL HIGH (ref 0.57–1.00)
GFR calc Af Amer: 50 mL/min/{1.73_m2} — ABNORMAL LOW (ref >59)
GFR calc non Af Amer: 43 mL/min/{1.73_m2} — ABNORMAL LOW (ref >59)
Globulin, Total: 2.6 g/dL (ref 1.5–4.5)
Glucose: 99 mg/dL (ref 65–99)
Potassium: 4.7 mmol/L (ref 3.5–5.2)
Sodium: 141 mmol/L (ref 134–144)
Total Protein: 6.9 g/dL (ref 6.0–8.5)

## 2020-06-17 LAB — CBC WITH DIFFERENTIAL/PLATELET
Basophils Absolute: 0 10*3/uL (ref 0.0–0.2)
Basos: 0 %
EOS (ABSOLUTE): 0.3 10*3/uL (ref 0.0–0.4)
Eos: 4 %
Hematocrit: 40.8 % (ref 34.0–46.6)
Hemoglobin: 13.6 g/dL (ref 11.1–15.9)
Immature Grans (Abs): 0 10*3/uL (ref 0.0–0.1)
Immature Granulocytes: 0 %
Lymphocytes Absolute: 2.4 10*3/uL (ref 0.7–3.1)
Lymphs: 33 %
MCH: 32 pg (ref 26.6–33.0)
MCHC: 33.3 g/dL (ref 31.5–35.7)
MCV: 96 fL (ref 79–97)
Monocytes Absolute: 0.6 10*3/uL (ref 0.1–0.9)
Monocytes: 9 %
Neutrophils Absolute: 3.9 10*3/uL (ref 1.4–7.0)
Neutrophils: 54 %
Platelets: 336 10*3/uL (ref 150–450)
RBC: 4.25 x10E6/uL (ref 3.77–5.28)
RDW: 11.8 % (ref 11.7–15.4)
WBC: 7.3 10*3/uL (ref 3.4–10.8)

## 2020-06-17 LAB — LIPID PANEL
Chol/HDL Ratio: 3.4 ratio (ref 0.0–4.4)
Cholesterol, Total: 200 mg/dL — ABNORMAL HIGH (ref 100–199)
HDL: 59 mg/dL (ref >39)
LDL Chol Calc (NIH): 119 mg/dL — ABNORMAL HIGH (ref 0–99)
Triglycerides: 122 mg/dL (ref 0–149)
VLDL Cholesterol Cal: 22 mg/dL (ref 5–40)

## 2020-06-18 ENCOUNTER — Ambulatory Visit (INDEPENDENT_AMBULATORY_CARE_PROVIDER_SITE_OTHER): Payer: Medicare HMO

## 2020-06-18 DIAGNOSIS — Z0000 Encounter for general adult medical examination without abnormal findings: Secondary | ICD-10-CM | POA: Diagnosis not present

## 2020-06-18 NOTE — Progress Notes (Signed)
MEDICARE ANNUAL WELLNESS VISIT  06/18/2020  Telephone Visit Disclaimer This Medicare AWV was conducted by telephone due to national recommendations for restrictions regarding the COVID-19 Pandemic (e.g. social distancing).  I verified, using two identifiers, that I am speaking with [name] or their authorized healthcare agent. I discussed the limitations, risks, security, and privacy concerns of performing an evaluation and management service by telephone and the potential availability of an in-person appointment in the future. The patient expressed understanding and agreed to proceed.  Location of Patient: Home Location of Provider (nurse):  [facility]  Subjective:    [name] is a 75 y.o. female patient of Chevis Pretty, [place] who had a Medicare Annual Wellness Visit today via telephone. [name] is Retired and lives in her own home with her son and daughter in Sports [place].  She has three children, four grandchildren and five great grandchildren. She reports that she is socially active and does interact with friends/family regularly. She is physically active and enjoys working puzzles and spending time with her family.  Patient Care Team: Chevis Pretty, FNP as PCP - General (Family Medicine) Nancy Marus, MD as Consulting Physician (Gynecologic Oncology) Raynelle Bring, MD as Consulting Physician (Urology) Marlaine Hind, MD as Consulting Physician (Pain Medicine) Loletha Carrow Kirke Corin, MD as Consulting Physician (Gastroenterology) Michael Boston, MD as Consulting Physician (General Surgery)  Advanced Directives 06/18/2020 03/19/2020 09/25/2019 08/23/2019 08/23/2019 08/15/2019 04/22/2019  Does Patient Have a Medical Advance Directive? No Yes Yes Yes Yes Yes Yes  Type of Advance Directive - Living will [address];Living will Living will;Healthcare Power of Attorney Living will;Healthcare Power of [place];Living will  Living will  Does patient want to make changes to medical advance directive? - - - No - Patient declined - - Yes (Inpatient - patient defers changing a medical advance directive and declines information at this time)  Copy of [place] in Chart? - - - No - copy requested No - copy requested - No - copy requested  Would patient like information on creating a medical advance directive? No - Patient declined - - - - - -  Pre-existing out of facility DNR order (yellow form or pink MOST form) - - - - - - -    Hospital Utilization Over the Past 12 Months: # of hospitalizations or ER visits: 0 # of surgeries: 1  Review of Systems    Patient reports that her overall health is unchanged compared to last year.  History obtained from chart review and the patient  Patient Reported Readings (BP, Pulse, CBG, Weight, etc) none  Pain Assessment Pain : 0-10 Pain Score: 4  Pain Location: Back Pain Orientation: Mid Pain Descriptors / Indicators: Aching,Constant Pain Onset: More than a month ago Pain Frequency: Several days a week Pain Relieving Factors: Rest, pain medication  Pain Relieving Factors: Rest, pain medication  Current Medications & Allergies (verified) Allergies as of 06/18/2020      Reactions   Cashew Nut Oil Hives   Dog Epithelium Hives, Itching   Sulfa Antibiotics    "blistering lips and mouth sores"      Medication List       Accurate as of June 18, 2020  9:02 AM. If you have any questions, ask your nurse or doctor.        amitriptyline 25 MG tablet Commonly known as: ELAVIL Take 1 tablet (25 mg total) by mouth at bedtime.   furosemide 20  MG tablet Commonly known as: LASIX Take 1 tablet (20 mg total) by mouth daily.   gabapentin 300 MG capsule Commonly known as: NEURONTIN Take 1 capsule (300 mg total) by mouth 3 (three) times daily.   lisinopril 20 MG tablet Commonly known as: ZESTRIL Take 1 tablet (20 mg total) by mouth daily.    meloxicam 7.5 MG tablet Commonly known as: MOBIC Take 1 tablet (7.5 mg total) by mouth daily.   methocarbamol 500 MG tablet Commonly known as: ROBAXIN TAKE 1 TABLET BY MOUTH AT BEDTIME   nystatin cream Commonly known as: MYCOSTATIN Apply 1 application topically 2 (two) times daily.   oxyCODONE-acetaminophen 7.5-325 MG tablet Commonly known as: Percocet Take 1 tablet by mouth every 8 (eight) hours as needed for severe pain.   oxyCODONE-acetaminophen 7.5-325 MG tablet Commonly known as: Percocet Take 1 tablet by mouth every 8 (eight) hours as needed for severe pain. Start taking on: July 18, 2020   oxyCODONE-acetaminophen 7.5-325 MG tablet Commonly known as: Percocet Take 1 tablet by mouth every 8 (eight) hours as needed for severe pain. Start taking on: August 17, 2020   PreserVision AREDS Caps Take 1 capsule by mouth in the morning and at bedtime.       History (reviewed): Past Medical History:  Diagnosis Date  . Arthritis   . Cancer ([place])    ovarian,skin Ca. in mouth  . Chronic constipation   . Chronic kidney disease   . Chronic midline low back pain with sciatica   . Colon polyp    large ascending colon polyp , scheduled for resection 02/ 2021  . Full dentures   . History of endometrial cancer 2012   s/p   TAH w/ BSO 11-02-2010  . History of recurrent UTIs   . History of sepsis    multiple times (some due to e.coli) last sepsis 07/ 2019  . History of small bowel obstruction    x2  ,  hx bowel resection  . HOH (hard of hearing)   . Hyperlipidemia   . Hypertension   . Retroperitoneal fibrosis   . Ureteral obstruction, left urologist-- dr Alinda Money   secondary to adhesions/ retroperitoneal fibrosis, treated with ureteral stent  . Vertigo   . Wears glasses    Past Surgical History:  Procedure Laterality Date  . ABDOMINAL HYSTERECTOMY  11/02/10  dr Alycia Rossetti  @[facility]    TAH, BSO, incisional hernia repair , lysis of adhesions for endometrial cancer  . ANTERIOR  LUMBAR FUSION  01-28-2008   dr elsner  @[facility]    L2 -- L4  . BACK SURGERY    . BUNIONECTOMY Right 2003  . CATARACT EXTRACTION W/ INTRAOCULAR LENS  IMPLANT, BILATERAL  2007  . COLONOSCOPY  last one 12-24-2018  . CYSTO/  LEFT RETROGRADE PYELOGRAM/ URETEROSCOPY STENT PLACEMENT/  OPEN URETEROLYSIS WITH OMENTAL FLAP Left 03-11-2009    dr borden  @[facility]   . CYSTO/  [facility] SLING/  ANTERIOR REPAIR  12-06-2001    dr Jeffie Pollock @[facility]   . CYSTOSCOPY W/ URETERAL STENT PLACEMENT Left 12/07/2017   Procedure: CYSTOSCOPY WITH RETROGRADE PYELOGRAM/URETERAL STENT PLACEMENT;  Surgeon: Raynelle Bring, MD;  Location: [facility];  Service: Urology;  Laterality: Left;  . CYSTOSCOPY W/ URETERAL STENT PLACEMENT Left 03/29/2018   Procedure: CYSTOSCOPY WITH STENT  EXCHANGE;  Surgeon: Raynelle Bring, MD;  Location: [facility];  Service: Urology;  Laterality: Left;  . CYSTOSCOPY WITH RETROGRADE PYELOGRAM, URETEROSCOPY AND STENT PLACEMENT Left 12-29-2008   dr Alinda Money  @[facility]    WITH BALLOON DILATION  LEFT URETERAL STRICTURE  . CYSTOSCOPY WITH RETROGRADE PYELOGRAM, URETEROSCOPY AND STENT PLACEMENT Left 09-12-2008    dr Jeffie Pollock @[facility]   . CYSTOSCOPY WITH RETROGRADE PYELOGRAM, URETEROSCOPY AND STENT PLACEMENT Left 03/23/2020   Procedure: CYSTOSCOPY WITH LEFT RETROGRADE PYELOGRAM, AND LEFT STENT REMOVAL;  Surgeon: Raynelle Bring, MD;  Location: [facility];  Service: Urology;  Laterality: Left;  . CYSTOSCOPY WITH STENT PLACEMENT Left 07/26/2018   Procedure: CYSTOSCOPY WITH STENT CHANGE;  Surgeon: Raynelle Bring, MD;  Location: [facility];  Service: Urology;  Laterality: Left;  . CYSTOSCOPY WITH STENT PLACEMENT Left 12/03/2018   Procedure: CYSTOSCOPY WITH STENT CHANGE;  Surgeon: Raynelle Bring, MD;  Location: [facility];  Service: Urology;  Laterality: Left;  . CYSTOSCOPY WITH STENT PLACEMENT Left 04/22/2019   Procedure: CYSTOSCOPY WITH STENT CHANGE;  Surgeon: Raynelle Bring, MD;  Location[facility];  Service: Urology;  Laterality: Left;  . CYSTOSCOPY WITH  STENT PLACEMENT Left 10/03/2019   Procedure: CYSTOSCOPY WITH STENT CHANGE;  Surgeon: Raynelle Bring, MD;  Location: [facility];  Service: Urology;  Laterality: Left;  . EYE SURGERY    . FOOT SURGERY Left 06-23-2010   dr Beola Cord   left first and second toes  . INSERTION OF MESH N/A 04/23/2013   Procedure: INSERTION OF MESH;  Surgeon: Adin Hector, MD;  Location: [facility];  Service: General;  Laterality: N/A;  . JOINT REPLACEMENT    . KNEE ARTHROSCOPY Right 04-27-2007  dr Shellia Carwin @[facility]   . LAPAROSCOPIC LYSIS OF ADHESIONS N/A 04/23/2013   Procedure: LAPAROSCOPIC LYSIS OF ADHESIONS;  Surgeon: Adin Hector, MD;  Location: [facility];  Service: General;  Laterality: N/A;  . [place] and 1999  . POSTERIOR LUMBAR FUSION  12/17/2012   L3 -- L5 laminectomy and L3 -- 5 fusion  . TOTAL HIP ARTHROPLASTY Left 07/04/2014   Procedure: LEFT TOTAL HIP ARTHROPLASTY ANTERIOR APPROACH;  Surgeon: Gearlean Alf, MD;  Location: [place];  Service: Orthopedics;  Laterality: Left;  . TOTAL HIP ARTHROPLASTY Right 2009  . TOTAL KNEE ARTHROPLASTY Right 09-15-2009   dr Shellia Carwin @[facility]   . VENTRAL HERNIA REPAIR N/A 07/19/2012   Procedure: LAPAROSCOPIC LYSIS OF ADHESIONS, SMALL BOWEL RESECTION, SEROSAL REPAIR, PRIMARY VENTRAL HERNIA REPAIR;  Surgeon: Adin Hector, MD;  Location[facility];  Service: General;  Laterality: N/A;  . VENTRAL HERNIA REPAIR N/A 04/23/2013   Procedure: LAPAROSCOPIC exploration and repair of hernia in abdominal VENTRAL wall  HERNIA;  Surgeon: Adin Hector, MD;  Location: [facility];  Service: General;  Laterality: N/A;   Family History  Problem Relation Age of Onset  . Diabetes Mother   . Lung cancer Mother   . Diabetes Father   . Liver cancer Father   . Colon cancer Sister   . Diabetes Sister   . Seizures Daughter   . Diabetes Son   . Diabetes Sister   . Hypertension Sister   . Kidney disease Sister   . Breast cancer Neg Hx   . Allergic rhinitis Neg Hx   . Angioedema Neg Hx    . Asthma Neg Hx   . Atopy Neg Hx   . Eczema Neg Hx   . Immunodeficiency Neg Hx   . Urticaria Neg Hx   . Colon polyps Neg Hx   . Esophageal cancer Neg Hx   . Rectal cancer Neg Hx   . Stomach cancer Neg Hx    Social History   Socioeconomic History  . Marital status: Single  Spouse name: Not on file  . Number of children: 3  . Years of education: 9  . Highest education level: High school graduate  Occupational History  . Occupation: Retired  Tobacco Use  . Smoking status: Never Smoker  . Smokeless tobacco: Never Used  Vaping Use  . Vaping Use: Never used  Substance and Sexual Activity  . Alcohol use: No  . Drug use: No  . Sexual activity: Not Currently    Birth control/protection: Surgical  Other Topics Concern  . Not on file  Social History Narrative  . Not on file   Social Determinants of Health   Financial Resource Strain: Not on file  Food Insecurity: Not on file  Transportation Needs: Not on file  Physical Activity: Not on file  Stress: Not on file  Social Connections: Not on file    Activities of Daily Living In your present state of health, do you have any difficulty performing the following activities: 06/18/2020 03/19/2020  Hearing? Y [place]? N N  Difficulty concentrating or making decisions? N N  Walking or climbing stairs? N N  Dressing or bathing? N N  Doing errands, shopping? N N  Preparing Food and eating ? N -  Using the Toilet? N -  In the past six months, have you accidently leaked urine? N -  Do you have problems with loss of bowel control? N -  Managing your Medications? N -  Managing your Finances? N -  Housekeeping or managing your Housekeeping? N -  Some recent data might be hidden   Patient reports that she does have difficulty hearing and wears a hearing aid in her right ear.  Patient Education/ Literacy How often do you need to have someone help you when you read instructions, pamphlets, or other written  materials from your doctor or pharmacy?: 1 - Never What is the last grade level you completed in school?: 12th grade  Exercise Current Exercise Habits: The patient does not participate in regular exercise at present, Exercise limited by: orthopedic condition(s)  Diet Patient reports consuming 2 meals a day and 2 snack(s) a day Patient reports that her primary diet is: Regular Patient reports that she does have regular access to food.   Depression Screen PHQ 2/9 Scores 06/18/2020 06/16/2020 03/20/2020 12/19/2019 09/19/2019 03/22/2019 12/19/2018  PHQ - 2 Score 0 0 0 0 0 0 0  PHQ- 9 Score - - 0 - - - -     Fall Risk Fall Risk  06/18/2020 06/16/2020 03/20/2020 12/19/2019 09/19/2019  Falls in the past year? 0 0 0 0 0  Number falls in past yr: - - - - -  Injury with Fall? - - - - -  Risk for fall due to : - - - - -  Follow up Falls evaluation completed - - - -     Objective:  [name] seemed alert and oriented and she participated appropriately during our telephone visit.  Blood Pressure Weight BMI  BP Readings from Last 3 Encounters:  06/16/20 127/67  03/23/20 130/60  03/20/20 129/65   Wt Readings from Last 3 Encounters:  06/16/20 199 lb (90.3 kg)  03/23/20 195 lb (88.5 kg)  03/20/20 197 lb (89.4 kg)   BMI Readings from Last 1 Encounters:  06/16/20 38.86 kg/m    *Unable to obtain current vital signs, weight, and BMI due to telephone visit type  Hearing/Vision  . [place] did not seem to have difficulty  with hearing/understanding during the telephone conversation . Reports that she has had a formal eye exam by an eye care professional within the past year . Reports that she has not had a formal hearing evaluation within the past year *Unable to fully assess hearing and vision during telephone visit type  Cognitive Function: 6CIT Screen 06/18/2020  What Year? 0 points  What month? 0 points  What time? 0 points  Count back from 20 0 points  Months in reverse 0 points   Repeat phrase 0 points  Total Score 0   (Normal:0-7, Significant for Dysfunction: >8)  Normal Cognitive Function Screening: Yes   Immunization & Health Maintenance Record Immunization History  Administered Date(s) Administered  . Fluad Quad(high Dose 65+) 03/22/2019, 03/20/2020  . Influenza, High Dose Seasonal PF 02/19/2016, 03/20/2017, 03/14/2018  . Influenza,inj,Quad PF,6+ Mos 02/21/2013, 03/28/2014, 04/17/2015  . Moderna Sars-Covid-2 Vaccination 07/15/2019, 08/12/2019, 03/31/2020  . Pneumococcal Conjugate-13 09/30/2014  . Pneumococcal Polysaccharide-23 05/09/2010  . Pneumococcal-Unspecified 05/09/2016  . Tdap 03/09/2012  . Zoster 05/09/2010    Health Maintenance  Topic Date Due  . COLONOSCOPY (Pts 45-50yrs Insurance coverage will need to be confirmed)  06/20/2020 (Originally 12/24/2019)  . TETANUS/TDAP  03/09/2022  . INFLUENZA VACCINE  Completed  . DEXA SCAN  Completed  . COVID-19 Vaccine  Completed  . Hepatitis C Screening  Completed  . PNA vac Low Risk Adult  Completed       Assessment  This is a routine wellness examination for [employer] [name].  Health Maintenance: Due or Overdue There are no preventive care reminders to display for this patient.  [name] does not need a referral for Community Assistance: Care Management:   no Social Work:    no Prescription Assistance:  no Nutrition/Diabetes Education:  no   Plan:  Personalized Goals Goals Addressed            This Visit's Progress   . Patient Stated       06/18/2020 AWV Goal: Exercise for General Health   Patient will verbalize understanding of the benefits of increased physical activity:  Exercising regularly is important. It will improve your overall fitness, flexibility, and endurance.  Regular exercise also will improve your overall health. It can help you control your weight, reduce stress, and improve your bone density.  Over the next year, patient will increase  physical activity as tolerated with a goal of at least 150 minutes of moderate physical activity per week.   You can tell that you are exercising at a moderate intensity if your heart starts beating faster and you start breathing faster but can still hold a conversation.  Moderate-intensity exercise ideas include:  Walking 1 mile (1.6 km) in about 15 minutes  Biking  Hiking  Golfing  Dancing  Water aerobics  Patient will verbalize understanding of everyday activities that increase physical activity by providing examples like the following: ? Yard work, such as: ? Pushing a Conservation officer, nature ? Raking and bagging leaves ? Washing your car ? Pushing a stroller ? Shoveling snow ? Gardening ? Washing windows or floors  Patient will be able to explain general safety guidelines for exercising:   Before you start a new exercise program, talk with your health care provider.  Do not exercise so much that you hurt yourself, feel dizzy, or get very short of breath.  Wear comfortable clothes and wear shoes with good support.  Drink plenty of water while you exercise to prevent dehydration or heat stroke.  Work out until your breathing and your heartbeat get faster.       Personalized Health Maintenance & Screening Recommendations  Bone densitometry screening  Lung Cancer Screening Recommended: no (Low Dose CT Chest recommended if Age 66-80 years, 30 pack-year currently smoking OR have quit w/in past 15 years) Hepatitis C Screening recommended: no HIV Screening recommended: no  Advanced Directives: Written information was not prepared per patient's request.  Referrals & Orders No orders of the defined types were placed in this encounter.   Follow-up Plan . Follow-up with Chevis Pretty, FNP as planned . Schedule dexa scan    I have personally reviewed and noted the following in the patient's chart:   . Medical and social history . Use of alcohol, tobacco or illicit  drugs  . Current medications and supplements . Functional ability and status . Nutritional status . Physical activity . Advanced directives . List of other physicians . Hospitalizations, surgeries, and ER visits in previous 12 months . Vitals . Screenings to include cognitive, depression, and falls . Referrals and appointments  In addition, I have reviewed and discussed with [name] certain preventive protocols, quality metrics, and best practice recommendations. A written personalized care plan for preventive services as well as general preventive health recommendations is available and can be mailed to the patient at her request.      Felicity Coyer, LPN    [id number]  Patient declined after visit summary

## 2020-06-19 MED ORDER — ATORVASTATIN CALCIUM 40 MG PO TABS: 40.0000 mg | ORAL_TABLET | Freq: Every day | ORAL | 1 refills | Status: DC

## 2020-06-19 NOTE — Addendum Note (Signed)
Addended by: Chevis Pretty on: 06/19/2020 01:40 PM   Modules accepted: Orders

## 2020-06-22 ENCOUNTER — Ambulatory Visit: Payer: Self-pay | Admitting: Nurse Practitioner

## 2020-07-01 DIAGNOSIS — M25551 Pain in right hip: Secondary | ICD-10-CM | POA: Diagnosis not present

## 2020-07-07 ENCOUNTER — Other Ambulatory Visit: Payer: Self-pay

## 2020-07-07 NOTE — Telephone Encounter (Signed)
  Prescription Request  07/07/2020  What is the name of the medication or equipment? Methocarbamol  Have you contacted your pharmacy to request a refill? (if applicable) Yes  Which pharmacy would you like this sent to? [facility], [place]  Pt had last visit with [facility] on 06/16/20 for pain management. Pt needs refills called in.   Patient notified that their request is being sent to the clinical staff for review and that they should receive a response within 2 business days.

## 2020-07-08 MED ORDER — METHOCARBAMOL 500 MG PO TABS: 500.0000 mg | ORAL_TABLET | Freq: Every day | ORAL | 0 refills | Status: DC

## 2020-07-08 NOTE — Telephone Encounter (Signed)
Pt aware refill sent to pharmacy 

## 2020-07-15 DIAGNOSIS — M25551 Pain in right hip: Secondary | ICD-10-CM | POA: Diagnosis not present

## 2020-08-15 ENCOUNTER — Other Ambulatory Visit: Payer: Self-pay | Admitting: Nurse Practitioner

## 2020-09-15 ENCOUNTER — Encounter: Payer: Self-pay | Admitting: Nurse Practitioner

## 2020-09-15 ENCOUNTER — Other Ambulatory Visit: Payer: Self-pay

## 2020-09-15 ENCOUNTER — Ambulatory Visit (INDEPENDENT_AMBULATORY_CARE_PROVIDER_SITE_OTHER): Payer: Medicare HMO | Admitting: Nurse Practitioner

## 2020-09-15 VITALS — BP 137/70 | HR 67 | Temp 98.0°F | Resp 20 | Ht 60.0 in | Wt 196.0 lb

## 2020-09-15 DIAGNOSIS — R69 Illness, unspecified: Secondary | ICD-10-CM | POA: Diagnosis not present

## 2020-09-15 DIAGNOSIS — M546 Pain in thoracic spine: Secondary | ICD-10-CM | POA: Diagnosis not present

## 2020-09-15 DIAGNOSIS — G8929 Other chronic pain: Secondary | ICD-10-CM

## 2020-09-15 DIAGNOSIS — E669 Obesity, unspecified: Secondary | ICD-10-CM

## 2020-09-15 DIAGNOSIS — E781 Pure hyperglyceridemia: Secondary | ICD-10-CM | POA: Diagnosis not present

## 2020-09-15 DIAGNOSIS — I10 Essential (primary) hypertension: Secondary | ICD-10-CM

## 2020-09-15 DIAGNOSIS — F3341 Major depressive disorder, recurrent, in partial remission: Secondary | ICD-10-CM

## 2020-09-15 DIAGNOSIS — R609 Edema, unspecified: Secondary | ICD-10-CM | POA: Diagnosis not present

## 2020-09-15 DIAGNOSIS — G629 Polyneuropathy, unspecified: Secondary | ICD-10-CM | POA: Diagnosis not present

## 2020-09-15 LAB — CMP14+EGFR
ALT: 19 IU/L (ref 0–32)
AST: 18 IU/L (ref 0–40)
Albumin/Globulin Ratio: 1.8 (ref 1.2–2.2)
Albumin: 4.2 g/dL (ref 3.7–4.7)
Alkaline Phosphatase: 101 IU/L (ref 44–121)
BUN/Creatinine Ratio: 12 (ref 12–28)
BUN: 10 mg/dL (ref 8–27)
Bilirubin Total: 0.4 mg/dL (ref 0.0–1.2)
CO2: 22 mmol/L (ref 20–29)
Calcium: 9.3 mg/dL (ref 8.7–10.3)
Chloride: 102 mmol/L (ref 96–106)
Creatinine, Ser: 0.84 mg/dL (ref 0.57–1.00)
Globulin, Total: 2.4 g/dL (ref 1.5–4.5)
Glucose: 105 mg/dL — ABNORMAL HIGH (ref 65–99)
Potassium: 4.6 mmol/L (ref 3.5–5.2)
Sodium: 141 mmol/L (ref 134–144)
Total Protein: 6.6 g/dL (ref 6.0–8.5)
eGFR: 72 mL/min/{1.73_m2} (ref >59)

## 2020-09-15 LAB — CBC WITH DIFFERENTIAL/PLATELET
Basophils Absolute: 0 10*3/uL (ref 0.0–0.2)
Basos: 0 %
EOS (ABSOLUTE): 0.2 10*3/uL (ref 0.0–0.4)
Eos: 3 %
Hematocrit: 41.6 % (ref 34.0–46.6)
Hemoglobin: 13.5 g/dL (ref 11.1–15.9)
Immature Grans (Abs): 0 10*3/uL (ref 0.0–0.1)
Immature Granulocytes: 0 %
Lymphocytes Absolute: 2.4 10*3/uL (ref 0.7–3.1)
Lymphs: 32 %
MCH: 30.5 pg (ref 26.6–33.0)
MCHC: 32.5 g/dL (ref 31.5–35.7)
MCV: 94 fL (ref 79–97)
Monocytes Absolute: 0.5 10*3/uL (ref 0.1–0.9)
Monocytes: 7 %
Neutrophils Absolute: 4.5 10*3/uL (ref 1.4–7.0)
Neutrophils: 58 %
Platelets: 301 10*3/uL (ref 150–450)
RBC: 4.43 x10E6/uL (ref 3.77–5.28)
RDW: 11.9 % (ref 11.7–15.4)
WBC: 7.6 10*3/uL (ref 3.4–10.8)

## 2020-09-15 LAB — LIPID PANEL
Chol/HDL Ratio: 3.1 ratio (ref 0.0–4.4)
Cholesterol, Total: 172 mg/dL (ref 100–199)
HDL: 56 mg/dL (ref >39)
LDL Chol Calc (NIH): 94 mg/dL (ref 0–99)
Triglycerides: 127 mg/dL (ref 0–149)
VLDL Cholesterol Cal: 22 mg/dL (ref 5–40)

## 2020-09-15 MED ORDER — AMITRIPTYLINE HCL 25 MG PO TABS: 25.0000 mg | ORAL_TABLET | Freq: Every day | ORAL | 1 refills | Status: DC

## 2020-09-15 MED ORDER — GABAPENTIN 300 MG PO CAPS: 300.0000 mg | ORAL_CAPSULE | Freq: Three times a day (TID) | ORAL | 2 refills | Status: DC

## 2020-09-15 MED ORDER — OXYCODONE-ACETAMINOPHEN 7.5-325 MG PO TABS: 1.0000 | ORAL_TABLET | Freq: Three times a day (TID) | ORAL | 0 refills | Status: AC | PRN

## 2020-09-15 MED ORDER — OXYCODONE-ACETAMINOPHEN 7.5-325 MG PO TABS: 1.0000 | ORAL_TABLET | Freq: Three times a day (TID) | ORAL | 0 refills | Status: DC | PRN

## 2020-09-15 MED ORDER — FUROSEMIDE 20 MG PO TABS: 20.0000 mg | ORAL_TABLET | Freq: Every day | ORAL | 1 refills | Status: DC

## 2020-09-15 MED ORDER — LISINOPRIL 20 MG PO TABS: 20.0000 mg | ORAL_TABLET | Freq: Every day | ORAL | 1 refills | Status: DC

## 2020-09-15 MED ORDER — ATORVASTATIN CALCIUM 40 MG PO TABS: 40.0000 mg | ORAL_TABLET | Freq: Every day | ORAL | 1 refills | Status: DC

## 2020-09-15 NOTE — Addendum Note (Signed)
Addended by: Chevis Pretty on: 09/15/2020 10:55 AM   Modules accepted: Orders

## 2020-09-15 NOTE — Patient Instructions (Signed)
Fall Prevention in the Home, Adult Falls can cause injuries and can happen to people of all ages. There are many things you can do to make your home safe and to help prevent falls. Ask for help when making these changes. What actions can I take to prevent falls? General Instructions  Use good lighting in all rooms. Replace any light bulbs that burn out.  Turn on the lights in dark areas. Use night-lights.  Keep items that you use often in easy-to-reach places. Lower the shelves around your home if needed.  Set up your furniture so you have a clear path. Avoid moving your furniture around.  Do not have throw rugs or other things on the floor that can make you trip.  Avoid walking on wet floors.  If any of your floors are uneven, fix them.  Add color or contrast paint or tape to clearly mark and help you see: ? Grab bars or handrails. ? First and last steps of staircases. ? Where the edge of each step is.  If you use a stepladder: ? Make sure that it is fully opened. Do not climb a closed stepladder. ? Make sure the sides of the stepladder are locked in place. ? Ask someone to hold the stepladder while you use it.  Know where your pets are when moving through your home. What can I do in the bathroom?  Keep the floor dry. Clean up any water on the floor right away.  Remove soap buildup in the tub or shower.  Use nonskid mats or decals on the floor of the tub or shower.  Attach bath mats securely with double-sided, nonslip rug tape.  If you need to sit down in the shower, use a plastic, nonslip stool.  Install grab bars by the toilet and in the tub and shower. Do not use towel bars as grab bars.      What can I do in the bedroom?  Make sure that you have a light by your bed that is easy to reach.  Do not use any sheets or blankets for your bed that hang to the floor.  Have a firm chair with side arms that you can use for support when you get dressed. What can I do in  the kitchen?  Clean up any spills right away.  If you need to reach something above you, use a step stool with a grab bar.  Keep electrical cords out of the way.  Do not use floor polish or wax that makes floors slippery. What can I do with my stairs?  Do not leave any items on the stairs.  Make sure that you have a light switch at the top and the bottom of the stairs.  Make sure that there are handrails on both sides of the stairs. Fix handrails that are broken or loose.  Install nonslip stair treads on all your stairs.  Avoid having throw rugs at the top or bottom of the stairs.  Choose a carpet that does not hide the edge of the steps on the stairs.  Check carpeting to make sure that it is firmly attached to the stairs. Fix carpet that is loose or worn. What can I do on the outside of my home?  Use bright outdoor lighting.  Fix the edges of walkways and driveways and fix any cracks.  Remove anything that might make you trip as you walk through a door, such as a raised step or threshold.  Trim any   bushes or trees on paths to your home.  Check to see if handrails are loose or broken and that both sides of all steps have handrails.  Install guardrails along the edges of any raised decks and porches.  Clear paths of anything that can make you trip, such as tools or rocks.  Have leaves, snow, or ice cleared regularly.  Use sand or salt on paths during winter.  Clean up any spills in your garage right away. This includes grease or oil spills. What other actions can I take?  Wear shoes that: ? Have a low heel. Do not wear high heels. ? Have rubber bottoms. ? Feel good on your feet and fit well. ? Are closed at the toe. Do not wear open-toe sandals.  Use tools that help you move around if needed. These include: ? Canes. ? Walkers. ? Scooters. ? Crutches.  Review your medicines with your doctor. Some medicines can make you feel dizzy. This can increase your chance  of falling. Ask your doctor what else you can do to help prevent falls. Where to find more information  Centers for Disease Control and Prevention, [employer]AD[employer]: www.cdc.gov  National Institute on Aging: www.nia.nih.gov Contact a doctor if:  You are afraid of falling at home.  You feel weak, drowsy, or dizzy at home.  You fall at home. Summary  There are many simple things that you can do to make your home safe and to help prevent falls.  Ways to make your home safe include removing things that can make you trip and installing grab bars in the bathroom.  Ask for help when making these changes in your home. This information is not intended to replace advice given to you by your health care provider. Make sure you discuss any questions you have with your health care provider. Document Revised: 11/27/2019 Document Reviewed: 11/27/2019 Elsevier Patient Education  2021 [employer]  

## 2020-09-15 NOTE — Progress Notes (Signed)
Subjective:    Patient ID: [name], female    DOB: 03/16/1946, 75 y.o.   MRN: [id number]   Chief Complaint: Medical Management of Chronic Issues (Look at spot on face)    HPI:  1. Primary hypertension No c/o chest pain, sob or headaches. Does not check blood pressure at home. BP Readings from Last 3 Encounters:  09/15/20 137/70  06/16/20 127/67  03/23/20 130/60     2. Peripheral polyneuropathy Bottom of her feet burn all the  time. The neurontin helps but never completely goes away.  3. Chronic midline thoracic back pain *has chronic back pain. Says her legs seem to be getting weak.  Pain assessment: Cause of pain- Pesudoarthrosis Pain location- L3-L5 Pain on scale of 1-10- 4/10 currently Frequency- daily What increases pain-standing What makes pain Better-rest Effects on ADL - none Any change in general medical condition-none  Current opioids rx- percocet 7.5/325 3x a day # meds rx- 90 Effectiveness of current meds-helps Adverse reactions from pain meds-none Morphine equivalent- 33.75 MME  Pill count performed-No Last drug screen - 09/19/19 ( high risk q37m, moderate risk q46m, low risk yearly ) Urine drug screen today- No Was the [facility] reviewed- yes  If yes were their any concerning findings? - no   Overdose risk: 1 Pain contract signed on:03/27/20   4. Recurrent major depressive disorder, in partial remission ([place]) Is currently not on an antidepressant Depression screen [facility] 2/9 09/15/2020 06/18/2020 06/16/2020  Decreased Interest 0 0 0  Down, Depressed, Hopeless 0 0 0  PHQ - 2 Score 0 0 0  Altered sleeping 0 - -  Tired, decreased energy 0 - -  Change in appetite 0 - -  Feeling bad or failure about yourself  0 - -  Trouble concentrating 0 - -  Moving slowly or fidgety/restless 0 - -  Suicidal thoughts 0 - -  PHQ-9 Score 0 - -  Difficult doing work/chores Not difficult at all - -  Some recent data might be hidden     5. Peripheral  edema Has some daily.   6. Obesity (BMI 30-39.9) No recent weight changes Wt Readings from Last 3 Encounters:  09/15/20 196 lb (88.9 kg)  06/16/20 199 lb (90.3 kg)  03/23/20 195 lb (88.5 kg)   BMI Readings from Last 3 Encounters:  09/15/20 38.28 kg/m  06/16/20 38.86 kg/m  03/23/20 38.08 kg/m       Outpatient Encounter Medications as of 09/15/2020  Medication Sig  . amitriptyline (ELAVIL) 25 MG tablet Take 1 tablet (25 mg total) by mouth at bedtime.  Marland Kitchen atorvastatin (LIPITOR) 40 MG tablet Take 1 tablet (40 mg total) by mouth daily.  . furosemide (LASIX) 20 MG tablet Take 1 tablet (20 mg total) by mouth daily.  Marland Kitchen gabapentin (NEURONTIN) 300 MG capsule Take 1 capsule (300 mg total) by mouth 3 (three) times daily.  Marland Kitchen lisinopril (ZESTRIL) 20 MG tablet Take 1 tablet (20 mg total) by mouth daily.  . methocarbamol (ROBAXIN) 500 MG tablet TAKE 1 TABLET BY MOUTH AT BEDTIME  . Multiple Vitamins-Minerals (PRESERVISION AREDS) CAPS Take 1 capsule by mouth in the morning and at bedtime.  Marland Kitchen nystatin cream (MYCOSTATIN) Apply 1 application topically 2 (two) times daily.  Marland Kitchen oxyCODONE-acetaminophen (PERCOCET) 7.5-325 MG tablet Take 1 tablet by mouth every 8 (eight) hours as needed for severe pain.  . meloxicam (MOBIC) 7.5 MG tablet Take 1 tablet (7.5 mg total) by mouth daily. (Patient not taking: Reported on 09/15/2020)  No facility-administered encounter medications on file as of 09/15/2020.    Past Surgical History:  Procedure Laterality Date  . ABDOMINAL HYSTERECTOMY  11/02/10  dr Alycia Rossetti  @[facility]    TAH, BSO, incisional hernia repair , lysis of adhesions for endometrial cancer  . ANTERIOR LUMBAR FUSION  01-28-2008   dr elsner  @[facility]    L2 -- L4  . BACK SURGERY    . BUNIONECTOMY Right 2003  . CATARACT EXTRACTION W/ INTRAOCULAR LENS  IMPLANT, BILATERAL  2007  . COLONOSCOPY  last one 12-24-2018  . CYSTO/  LEFT RETROGRADE PYELOGRAM/ URETEROSCOPY STENT PLACEMENT/  OPEN URETEROLYSIS WITH OMENTAL FLAP  Left 03-11-2009    dr borden  @[facility]   . CYSTO/  [facility] SLING/  ANTERIOR REPAIR  12-06-2001    dr Jeffie Pollock @[facility]   . CYSTOSCOPY W/ URETERAL STENT PLACEMENT Left 12/07/2017   Procedure: CYSTOSCOPY WITH RETROGRADE PYELOGRAM/URETERAL STENT PLACEMENT;  Surgeon: Raynelle Bring, MD;  Location: [facility];  Service: Urology;  Laterality: Left;  . CYSTOSCOPY W/ URETERAL STENT PLACEMENT Left 03/29/2018   Procedure: CYSTOSCOPY WITH STENT  EXCHANGE;  Surgeon: Raynelle Bring, MD;  Location: [facility];  Service: Urology;  Laterality: Left;  . CYSTOSCOPY WITH RETROGRADE PYELOGRAM, URETEROSCOPY AND STENT PLACEMENT Left 12-29-2008   dr borden  @[facility]    WITH BALLOON DILATION LEFT URETERAL STRICTURE  . CYSTOSCOPY WITH RETROGRADE PYELOGRAM, URETEROSCOPY AND STENT PLACEMENT Left 09-12-2008    dr 18-11-2008 @[facility]   . CYSTOSCOPY WITH RETROGRADE PYELOGRAM, URETEROSCOPY AND STENT PLACEMENT Left 03/23/2020   Procedure: CYSTOSCOPY WITH LEFT RETROGRADE PYELOGRAM, AND LEFT STENT REMOVAL;  Surgeon: 04/05/2020, MD;  Location: [facility];  Service: Urology;  Laterality: Left;  . CYSTOSCOPY WITH STENT PLACEMENT Left 07/26/2018   Procedure: CYSTOSCOPY WITH STENT CHANGE;  Surgeon: 08/08/2018, MD;  Location: [facility];  Service: Urology;  Laterality: Left;  . CYSTOSCOPY WITH STENT PLACEMENT Left 12/03/2018   Procedure: CYSTOSCOPY WITH STENT CHANGE;  Surgeon: 12/16/2018, MD;  Location: [facility];  Service: Urology;  Laterality: Left;  . CYSTOSCOPY WITH STENT PLACEMENT Left 04/22/2019   Procedure: CYSTOSCOPY WITH STENT CHANGE;  Surgeon: 05/05/2019, MD;  Location[facility]  Service: Urology;  Laterality: Left;  . CYSTOSCOPY WITH STENT PLACEMENT Left 10/03/2019   Procedure: CYSTOSCOPY WITH STENT CHANGE;  Surgeon: 10/16/2019, MD;  Location: [facility];  Service: Urology;  Laterality: Left;  . EYE SURGERY    . FOOT SURGERY Left 06-23-2010   dr 07-06-2010   left first and second toes  . INSERTION OF MESH N/A 04/23/2013   Procedure: INSERTION  OF MESH;  Surgeon: 05/06/2013, MD;  Location: [facility];  Service: General;  Laterality: N/A;  . JOINT REPLACEMENT    . KNEE ARTHROSCOPY Right 04-27-2007  dr 05-10-2007 @[facility]   . LAPAROSCOPIC LYSIS OF ADHESIONS N/A 04/23/2013   Procedure: LAPAROSCOPIC LYSIS OF ADHESIONS;  Surgeon: 05/06/2013, MD;  Location: [facility];  Service: General;  Laterality: N/A;  . Stem and 1999  . POSTERIOR LUMBAR FUSION  12/17/2012   L3 -- L5 laminectomy and L3 -- 5 fusion  . TOTAL HIP ARTHROPLASTY Left 07/04/2014   Procedure: LEFT TOTAL HIP ARTHROPLASTY ANTERIOR APPROACH;  Surgeon: 07/17/2014, MD;  Location: [place];  Service: Orthopedics;  Laterality: Left;  . TOTAL HIP ARTHROPLASTY Right 2009  . TOTAL KNEE ARTHROPLASTY Right 09-15-2009   dr 18-02-2010 @[facility]   . VENTRAL HERNIA REPAIR N/A 07/19/2012   Procedure: LAPAROSCOPIC LYSIS OF ADHESIONS, SMALL BOWEL RESECTION,  SEROSAL REPAIR, PRIMARY VENTRAL HERNIA REPAIR;  Surgeon: Adin Hector, MD;  Location: [facility];  Service: General;  Laterality: N/A;  . VENTRAL HERNIA REPAIR N/A 04/23/2013   Procedure: LAPAROSCOPIC exploration and repair of hernia in abdominal VENTRAL wall  HERNIA;  Surgeon: Adin Hector, MD;  Location: [facility];  Service: General;  Laterality: N/A;    Family History  Problem Relation Age of Onset  . Diabetes Mother   . Lung cancer Mother   . Diabetes Father   . Liver cancer Father   . Colon cancer Sister   . Diabetes Sister   . Seizures Daughter   . Diabetes Son   . Diabetes Sister   . Hypertension Sister   . Kidney disease Sister   . Breast cancer Neg Hx   . Allergic rhinitis Neg Hx   . Angioedema Neg Hx   . Asthma Neg Hx   . Atopy Neg Hx   . Eczema Neg Hx   . Immunodeficiency Neg Hx   . Urticaria Neg Hx   . Colon polyps Neg Hx   . Esophageal cancer Neg Hx   . Rectal cancer Neg Hx   . Stomach cancer Neg Hx     New complaints: None today  Social history: Daughter is in the hospital in liver  failure   Review of Systems  Constitutional: Negative for diaphoresis.  Eyes: Negative for pain.  Respiratory: Negative for shortness of breath.   Cardiovascular: Negative for chest pain, palpitations and leg swelling.  Gastrointestinal: Negative for abdominal pain.  Endocrine: Negative for polydipsia.  Skin: Negative for rash.  Neurological: Negative for dizziness, weakness and headaches.  Hematological: Does not bruise/bleed easily.  All other systems reviewed and are negative.      Objective:   Physical Exam Vitals and nursing note reviewed.  Constitutional:      General: She is not in acute distress.    Appearance: Normal appearance. She is well-developed.  HENT:     Head: Normocephalic.     Nose: Nose normal.  Eyes:     Pupils: Pupils are equal, round, and reactive to light.  Neck:     Vascular: No carotid bruit or JVD.  Cardiovascular:     Rate and Rhythm: Normal rate and regular rhythm.     Heart sounds: Normal heart sounds.  Pulmonary:     Effort: Pulmonary effort is normal. No respiratory distress.     Breath sounds: Normal breath sounds. No wheezing or rales.  Chest:     Chest wall: No tenderness.  Abdominal:     General: Bowel sounds are normal. There is no distension or abdominal bruit.     Palpations: Abdomen is soft. There is no hepatomegaly, splenomegaly, mass or pulsatile mass.     Tenderness: There is no abdominal tenderness.  Musculoskeletal:        General: Normal range of motion.     Cervical back: Normal range of motion and neck supple.  Lymphadenopathy:     Cervical: No cervical adenopathy.  Skin:    General: Skin is warm and dry.  Neurological:     Mental Status: She is alert and oriented to person, place, and time.     Deep Tendon Reflexes: Reflexes are normal and symmetric.  Psychiatric:        Behavior: Behavior normal.        Thought Content: Thought content normal.        Judgment: Judgment normal.     BP  137/70   Pulse 67    Temp 98 F (36.7 C) (Temporal)   Resp 20   Ht 5' (1.524 m)   Wt 196 lb (88.9 kg)   SpO2 99%   BMI 38.28 kg/m        Assessment & Plan:  [name] comes in today with chief complaint of Medical Management of Chronic Issues (Look at spot on face)   Diagnosis and orders addressed:  1. Primary hypertension Low sodium diet - lisinopril (ZESTRIL) 20 MG tablet; Take 1 tablet (20 mg total) by mouth daily.  Dispense: 90 tablet; Refill: 1  2. Peripheral polyneuropathy Do not go barefooted  3. Chronic midline thoracic back pain - oxyCODONE-acetaminophen (PERCOCET) 7.5-325 MG tablet; Take 1 tablet by mouth every 8 (eight) hours as needed for severe pain.  Dispense: 90 tablet; Refill: 0 - gabapentin (NEURONTIN) 300 MG capsule; Take 1 capsule (300 mg total) by mouth 3 (three) times daily.  Dispense: 90 capsule; Refill: 2 - amitriptyline (ELAVIL) 25 MG tablet; Take 1 tablet (25 mg total) by mouth at bedtime.  Dispense: 90 tablet; Refill: 1 - oxyCODONE-acetaminophen (PERCOCET) 7.5-325 MG tablet; Take 1 tablet by mouth every 8 (eight) hours as needed for severe pain.  Dispense: 90 tablet; Refill: 0 - oxyCODONE-acetaminophen (PERCOCET) 7.5-325 MG tablet; Take 1 tablet by mouth every 8 (eight) hours as needed for severe pain.  Dispense: 90 tablet; Refill: 0  4. Recurrent major depressive disorder, in partial remission ([place]) Stress management  5. Peripheral edema Elevate legs when sitting - furosemide (LASIX) 20 MG tablet; Take 1 tablet (20 mg total) by mouth daily.  Dispense: 90 tablet; Refill: 1  6. Obesity (BMI 30-39.9) Discussed diet and exercise for person with BMI >25 Will recheck weight in 3-6 months  7. Pure hypertriglyceridemia Low fat diet - atorvastatin (LIPITOR) 40 MG tablet; Take 1 tablet (40 mg total) by mouth daily.  Dispense: 90 tablet; Refill: 1   Labs pending Health Maintenance reviewed Diet and exercise encouraged  Follow up plan: 3  months   Whitney Hassell Done, FNP

## 2020-10-21 DIAGNOSIS — R8271 Bacteriuria: Secondary | ICD-10-CM | POA: Diagnosis not present

## 2020-10-21 DIAGNOSIS — N261 Atrophy of kidney (terminal): Secondary | ICD-10-CM | POA: Diagnosis not present

## 2020-10-21 DIAGNOSIS — N1330 Unspecified hydronephrosis: Secondary | ICD-10-CM | POA: Diagnosis not present

## 2020-10-21 DIAGNOSIS — N3020 Other chronic cystitis without hematuria: Secondary | ICD-10-CM | POA: Diagnosis not present

## 2020-10-21 DIAGNOSIS — K6389 Other specified diseases of intestine: Secondary | ICD-10-CM | POA: Diagnosis not present

## 2020-10-21 DIAGNOSIS — N10 Acute tubulo-interstitial nephritis: Secondary | ICD-10-CM | POA: Diagnosis not present

## 2020-11-14 ENCOUNTER — Other Ambulatory Visit: Payer: Self-pay | Admitting: Nurse Practitioner

## 2020-12-04 ENCOUNTER — Telehealth: Payer: Self-pay | Admitting: Nurse Practitioner

## 2020-12-04 NOTE — Telephone Encounter (Signed)
Rescheduled patients appointment so she would not run out of meds. Patient notified

## 2020-12-07 ENCOUNTER — Other Ambulatory Visit: Payer: Self-pay

## 2020-12-07 ENCOUNTER — Encounter: Payer: Self-pay | Admitting: Gastroenterology

## 2020-12-07 ENCOUNTER — Other Ambulatory Visit: Payer: Self-pay | Admitting: Nurse Practitioner

## 2020-12-07 ENCOUNTER — Encounter: Payer: Self-pay | Admitting: Nurse Practitioner

## 2020-12-07 ENCOUNTER — Ambulatory Visit (INDEPENDENT_AMBULATORY_CARE_PROVIDER_SITE_OTHER): Payer: Medicare HMO | Admitting: Nurse Practitioner

## 2020-12-07 VITALS — BP 118/78 | HR 69 | Temp 97.6°F | Ht 60.0 in | Wt 185.0 lb

## 2020-12-07 DIAGNOSIS — N3000 Acute cystitis without hematuria: Secondary | ICD-10-CM | POA: Diagnosis not present

## 2020-12-07 DIAGNOSIS — G629 Polyneuropathy, unspecified: Secondary | ICD-10-CM

## 2020-12-07 DIAGNOSIS — I10 Essential (primary) hypertension: Secondary | ICD-10-CM | POA: Diagnosis not present

## 2020-12-07 DIAGNOSIS — E669 Obesity, unspecified: Secondary | ICD-10-CM | POA: Diagnosis not present

## 2020-12-07 DIAGNOSIS — M546 Pain in thoracic spine: Secondary | ICD-10-CM

## 2020-12-07 DIAGNOSIS — R300 Dysuria: Secondary | ICD-10-CM

## 2020-12-07 DIAGNOSIS — E781 Pure hyperglyceridemia: Secondary | ICD-10-CM | POA: Diagnosis not present

## 2020-12-07 DIAGNOSIS — R69 Illness, unspecified: Secondary | ICD-10-CM | POA: Diagnosis not present

## 2020-12-07 DIAGNOSIS — F3341 Major depressive disorder, recurrent, in partial remission: Secondary | ICD-10-CM

## 2020-12-07 DIAGNOSIS — G8929 Other chronic pain: Secondary | ICD-10-CM

## 2020-12-07 DIAGNOSIS — R609 Edema, unspecified: Secondary | ICD-10-CM

## 2020-12-07 LAB — URINALYSIS, COMPLETE
Bilirubin, UA: NEGATIVE
Glucose, UA: NEGATIVE
Nitrite, UA: POSITIVE — AB
Specific Gravity, UA: 1.02 (ref 1.005–1.030)
Urobilinogen, Ur: 0.2 mg/dL (ref 0.2–1.0)
pH, UA: 6 (ref 5.0–7.5)

## 2020-12-07 LAB — CBC WITH DIFFERENTIAL/PLATELET
Basophils Absolute: 0 10*3/uL (ref 0.0–0.2)
Basos: 0 %
EOS (ABSOLUTE): 0.1 10*3/uL (ref 0.0–0.4)
Eos: 2 %
Hematocrit: 41 % (ref 34.0–46.6)
Hemoglobin: 13.6 g/dL (ref 11.1–15.9)
Immature Grans (Abs): 0 10*3/uL (ref 0.0–0.1)
Immature Granulocytes: 0 %
Lymphocytes Absolute: 2.4 10*3/uL (ref 0.7–3.1)
Lymphs: 35 %
MCH: 30.6 pg (ref 26.6–33.0)
MCHC: 33.2 g/dL (ref 31.5–35.7)
MCV: 92 fL (ref 79–97)
Monocytes Absolute: 0.5 10*3/uL (ref 0.1–0.9)
Monocytes: 7 %
Neutrophils Absolute: 3.8 10*3/uL (ref 1.4–7.0)
Neutrophils: 56 %
Platelets: 314 10*3/uL (ref 150–450)
RBC: 4.45 x10E6/uL (ref 3.77–5.28)
RDW: 12.3 % (ref 11.7–15.4)
WBC: 6.9 10*3/uL (ref 3.4–10.8)

## 2020-12-07 LAB — CMP14+EGFR
ALT: 15 IU/L (ref 0–32)
AST: 19 IU/L (ref 0–40)
Albumin/Globulin Ratio: 1.8 (ref 1.2–2.2)
Albumin: 4.3 g/dL (ref 3.7–4.7)
Alkaline Phosphatase: 93 IU/L (ref 44–121)
BUN/Creatinine Ratio: 10 — ABNORMAL LOW (ref 12–28)
BUN: 9 mg/dL (ref 8–27)
Bilirubin Total: 0.7 mg/dL (ref 0.0–1.2)
CO2: 25 mmol/L (ref 20–29)
Calcium: 9.6 mg/dL (ref 8.7–10.3)
Chloride: 103 mmol/L (ref 96–106)
Creatinine, Ser: 0.86 mg/dL (ref 0.57–1.00)
Globulin, Total: 2.4 g/dL (ref 1.5–4.5)
Glucose: 120 mg/dL — ABNORMAL HIGH (ref 65–99)
Potassium: 4.7 mmol/L (ref 3.5–5.2)
Sodium: 142 mmol/L (ref 134–144)
Total Protein: 6.7 g/dL (ref 6.0–8.5)
eGFR: 70 mL/min/{1.73_m2} (ref >59)

## 2020-12-07 LAB — LIPID PANEL
Chol/HDL Ratio: 4.3 ratio (ref 0.0–4.4)
Cholesterol, Total: 209 mg/dL — ABNORMAL HIGH (ref 100–199)
HDL: 49 mg/dL (ref >39)
LDL Chol Calc (NIH): 131 mg/dL — ABNORMAL HIGH (ref 0–99)
Triglycerides: 163 mg/dL — ABNORMAL HIGH (ref 0–149)
VLDL Cholesterol Cal: 29 mg/dL (ref 5–40)

## 2020-12-07 LAB — MICROSCOPIC EXAMINATION
Epithelial Cells (non renal): NONE SEEN /hpf (ref 0–10)
RBC, Urine: >30 /hpf — AB (ref 0–2)
WBC, UA: >30 /hpf — AB (ref 0–5)

## 2020-12-07 MED ORDER — OXYCODONE-ACETAMINOPHEN 7.5-325 MG PO TABS: 1.0000 | ORAL_TABLET | Freq: Three times a day (TID) | ORAL | 0 refills | Status: AC | PRN

## 2020-12-07 MED ORDER — DOXYCYCLINE HYCLATE 100 MG PO TABS: 100.0000 mg | ORAL_TABLET | Freq: Two times a day (BID) | ORAL | 0 refills | Status: DC

## 2020-12-07 MED ORDER — ATORVASTATIN CALCIUM 40 MG PO TABS: 40.0000 mg | ORAL_TABLET | Freq: Every day | ORAL | 1 refills | Status: DC

## 2020-12-07 MED ORDER — FUROSEMIDE 20 MG PO TABS: 20.0000 mg | ORAL_TABLET | Freq: Every day | ORAL | 1 refills | Status: DC

## 2020-12-07 MED ORDER — AMITRIPTYLINE HCL 25 MG PO TABS: 25.0000 mg | ORAL_TABLET | Freq: Every day | ORAL | 1 refills | Status: DC

## 2020-12-07 MED ORDER — OXYCODONE-ACETAMINOPHEN 7.5-325 MG PO TABS: 1.0000 | ORAL_TABLET | Freq: Three times a day (TID) | ORAL | 0 refills | Status: DC | PRN

## 2020-12-07 MED ORDER — METHOCARBAMOL 500 MG PO TABS: 500.0000 mg | ORAL_TABLET | Freq: Every day | ORAL | 1 refills | Status: DC

## 2020-12-07 NOTE — Progress Notes (Signed)
Subjective:    Patient ID: [name], female    DOB: Oct 22, 1945, 75 y.o.   MRN: [id number]   Chief Complaint: Medical Management of Chronic Issues, Hypertension, and Dysuria    HPI:  1. Dysuria Started a couple of days ago. Has urinary urgency and frequency.  2. Primary hypertension No c/o chest pain, sob or headaches. Doe snot check blood pressure at home. BP Readings from Last 3 Encounters:  12/07/20 118/78  09/15/20 137/70  06/16/20 127/67     3. Pure hypertriglyceridemia Doe stry to [facility] diet but doe sno dedicated exercise. Lab Results  Component Value Date   CHOL 172 09/15/2020   HDL 56 09/15/2020   LDLCALC 94 09/15/2020   TRIG 127 09/15/2020   CHOLHDL 3.1 09/15/2020     4. Peripheral polyneuropathy Has pain in bottom of both feet. She is on neurontin which helps  5. Recurrent major depressive disorder, in partial remission ([place]) She is doing well. Her daughter died a couple of months ago with liver failure. Depression screen [facility] 2/9 12/07/2020 09/15/2020 06/18/2020  Decreased Interest 0 0 0  Down, Depressed, Hopeless 0 0 0  PHQ - 2 Score 0 0 0  Altered sleeping - 0 -  Tired, decreased energy - 0 -  Change in appetite - 0 -  Feeling bad or failure about yourself  - 0 -  Trouble concentrating - 0 -  Moving slowly or fidgety/restless - 0 -  Suicidal thoughts - 0 -  PHQ-9 Score - 0 -  Difficult doing work/chores - Not difficult at all -  Some recent data might be hidden     6. Obesity (BMI 30-39.9) No recent weight changes Wt Readings from Last 3 Encounters:  12/07/20 185 lb (83.9 kg)  09/15/20 196 lb (88.9 kg)  06/16/20 199 lb (90.3 kg)   BMI Readings from Last 3 Encounters:  12/07/20 36.13 kg/m  09/15/20 38.28 kg/m  06/16/20 38.86 kg/m     7. Peripheral edema Has some daily  8. Chronic back pain Pain assessment: Cause of pain- DDD- several back surgeries Pain location- low back Pain on scale of 1-10- 5/10  currently Frequency- daily What increases pain-to much activity What makes pain Better-rest helps Effects on ADL - none Any change in general medical condition-none  Current opioids rx- percocet 3x a day # meds rx- 90 Effectiveness of current meds-helps but always has some pain Adverse reactions from pain meds-none Morphine equivalent- 33.75MME  Pill count performed-No Last drug screen - 09/19/19 ( high risk q27m moderate risk q634mlow risk yearly ) Urine drug screen today- No [place]  If yes were their any concerning findings? - no   Overdose risk: 1 Opioid Risk  09/15/2020  Alcohol 0  Illegal Drugs 0  Rx Drugs 0  Alcohol 0  Illegal Drugs 0  Rx Drugs 0  Age between 16-45 years  0  History of Preadolescent Sexual Abuse 0  Psychological Disease 0  Depression 0  Opioid Risk Tool Scoring 0  Opioid Risk Interpretation Low Risk     Pain contract signed on:03/27/20  Outpatient Encounter Medications as of 12/07/2020  Medication Sig   amitriptyline (ELAVIL) 25 MG tablet Take 1 tablet (25 mg total) by mouth at bedtime.   furosemide (LASIX) 20 MG tablet Take 1 tablet (20 mg total) by mouth daily.   gabapentin (NEURONTIN) 300 MG capsule Take 1 capsule (300 mg total) by mouth 3 (three) times daily.  lisinopril (ZESTRIL) 20 MG tablet Take 1 tablet (20 mg total) by mouth daily.   meloxicam (MOBIC) 7.5 MG tablet Take 1 tablet (7.5 mg total) by mouth daily.   methocarbamol (ROBAXIN) 500 MG tablet TAKE 1 TABLET BY MOUTH AT BEDTIME   Multiple Vitamins-Minerals (PRESERVISION AREDS) CAPS Take 1 capsule by mouth in the morning and at bedtime.   nystatin cream (MYCOSTATIN) Apply 1 application topically 2 (two) times daily.   oxyCODONE-acetaminophen (PERCOCET) 7.5-325 MG tablet Take 1 tablet by mouth every 8 (eight) hours as needed for severe pain.   atorvastatin (LIPITOR) 40 MG tablet Take 1 tablet (40 mg total) by mouth daily. (Patient not taking: Reported on 12/07/2020)    No facility-administered encounter medications on file as of 12/07/2020.    Past Surgical History:  Procedure Laterality Date   ABDOMINAL HYSTERECTOMY  11/02/10  dr Alycia Rossetti  '@[facility]'$    TAH, BSO, incisional hernia repair , lysis of adhesions for endometrial cancer   ANTERIOR LUMBAR FUSION  01-28-2008   dr elsner  '@[facility]'$    L2 -- L4   BACK SURGERY     BUNIONECTOMY Right 2003   CATARACT EXTRACTION W/ INTRAOCULAR LENS  IMPLANT, BILATERAL  2007   COLONOSCOPY  last one 12-24-2018   CYSTO/  LEFT RETROGRADE PYELOGRAM/ URETEROSCOPY STENT PLACEMENT/  OPEN URETEROLYSIS WITH OMENTAL FLAP Left 03-11-2009    dr borden  '@[facility]'$    CYSTO/  [facility] SLING/  ANTERIOR REPAIR  12-06-2001    dr Jeffie Pollock '@[facility]'$    CYSTOSCOPY W/ URETERAL STENT PLACEMENT Left 12/07/2017   Procedure: CYSTOSCOPY WITH RETROGRADE PYELOGRAM/URETERAL STENT PLACEMENT;  Surgeon: Raynelle Bring, MD;  [facility]: [facility];  Service: Urology;  Laterality: Left;   CYSTOSCOPY W/ URETERAL STENT PLACEMENT Left 03/29/2018   Procedure: CYSTOSCOPY WITH STENT  EXCHANGE;  Surgeon: Raynelle Bring, MD;  [facility];  Service: Urology;  Laterality: Left;   CYSTOSCOPY WITH RETROGRADE PYELOGRAM, URETEROSCOPY AND STENT PLACEMENT Left 12-29-2008   dr Alinda Money  '@[facility]'$    WITH BALLOON DILATION LEFT URETERAL STRICTURE   CYSTOSCOPY WITH RETROGRADE PYELOGRAM, URETEROSCOPY AND STENT PLACEMENT Left 09-12-2008    dr Jeffie Pollock '@[facility]'$    CYSTOSCOPY WITH RETROGRADE PYELOGRAM, URETEROSCOPY AND STENT PLACEMENT Left 03/23/2020   Procedure: CYSTOSCOPY WITH LEFT RETROGRADE PYELOGRAM, AND LEFT STENT REMOVAL;  Surgeon: Raynelle Bring, MD;  [facility];  Service: Urology;  Laterality: Left;   CYSTOSCOPY WITH STENT PLACEMENT Left 07/26/2018   Procedure: CYSTOSCOPY WITH STENT CHANGE;  Surgeon: Raynelle Bring, MD;  [facility];  Service: Urology;  Laterality: Left;   CYSTOSCOPY WITH STENT PLACEMENT Left 12/03/2018   Procedure: CYSTOSCOPY WITH STENT CHANGE;  Surgeon: Raynelle Bring, MD;  [facility];  Service: Urology;  Laterality: Left;   CYSTOSCOPY WITH STENT PLACEMENT Left 04/22/2019   Procedure: CYSTOSCOPY WITH STENT CHANGE;  Surgeon: Raynelle Bring, MD;  Location: [facility];  Service: Urology;  Laterality: Left;   CYSTOSCOPY WITH STENT PLACEMENT Left 10/03/2019   Procedure: CYSTOSCOPY WITH STENT CHANGE;  Surgeon: Raynelle Bring, MD;  [facility];  Service: Urology;  Laterality: Left;   EYE SURGERY     FOOT SURGERY Left 06-23-2010   dr Beola Cord   left first and second toes   INSERTION OF MESH N/A 04/23/2013   Procedure: INSERTION OF MESH;  Surgeon: Adin Hector, MD;  [facility];  Service: General;  Laterality: N/A;   JOINT REPLACEMENT     KNEE ARTHROSCOPY Right 04-27-2007  dr Shellia Carwin '@[facility]'$    LAPAROSCOPIC LYSIS OF ADHESIONS  N/A 04/23/2013   Procedure: LAPAROSCOPIC LYSIS OF ADHESIONS;  Surgeon: Adin Hector, MD;  [facility];  Service: General;  Laterality: N/A;   [place]  12/17/2012   L3 -- L5 laminectomy and L3 -- 5 fusion   TOTAL HIP ARTHROPLASTY Left 07/04/2014   Procedure: LEFT TOTAL HIP ARTHROPLASTY ANTERIOR APPROACH;  Surgeon: Gearlean Alf, MD;  Location: [place];  Service: Orthopedics;  Laterality: Left;   TOTAL HIP ARTHROPLASTY Right 2009   TOTAL KNEE ARTHROPLASTY Right 09-15-2009   dr Shellia Carwin '@[facility]'$    VENTRAL HERNIA REPAIR N/A 07/19/2012   Procedure: LAPAROSCOPIC LYSIS OF ADHESIONS, SMALL BOWEL RESECTION, SEROSAL REPAIR, PRIMARY VENTRAL HERNIA REPAIR;  Surgeon: Adin Hector, MD;  [facility];  Service: General;  Laterality: N/A;   VENTRAL HERNIA REPAIR N/A 04/23/2013   Procedure: LAPAROSCOPIC exploration and repair of hernia in abdominal VENTRAL wall  HERNIA;  Surgeon: Adin Hector, MD;  [facility];  Service: General;  Laterality: N/A;    Family History  Problem Relation Age of Onset   Diabetes Mother    Lung cancer Mother    Diabetes Father    Liver cancer  Father    Colon cancer Sister    Diabetes Sister    Seizures Daughter    Diabetes Son    Diabetes Sister    Hypertension Sister    Kidney disease Sister    Breast cancer Neg Hx    Allergic rhinitis Neg Hx    Angioedema Neg Hx    Asthma Neg Hx    Atopy Neg Hx    Eczema Neg Hx    Immunodeficiency Neg Hx    Urticaria Neg Hx    Colon polyps Neg Hx    Esophageal cancer Neg Hx    Rectal cancer Neg Hx    Stomach cancer Neg Hx     New complaints: None today  Social history: Lives with her son and his wife.  Controlled substance contract: n/a     Review of Systems  Constitutional:  Negative for diaphoresis.  Eyes:  Negative for pain.  Respiratory:  Negative for shortness of breath.   Cardiovascular:  Negative for chest pain, palpitations and leg swelling.  Gastrointestinal:  Negative for abdominal pain.  Endocrine: Negative for polydipsia.  Skin:  Negative for rash.  Neurological:  Negative for dizziness, weakness and headaches.  Hematological:  Does not bruise/bleed easily.  All other systems reviewed and are negative.     Objective:   Physical Exam Vitals and nursing note reviewed.  Constitutional:      General: She is not in acute distress.    Appearance: Normal appearance. She is well-developed.  HENT:     Head: Normocephalic.     Right Ear: Tympanic membrane normal.     Left Ear: Tympanic membrane normal.     Nose: Nose normal.     Mouth/Throat:     Mouth: Mucous membranes are moist.  Eyes:     Pupils: Pupils are equal, round, and reactive to light.  Neck:     Vascular: No carotid bruit or JVD.  Cardiovascular:     Rate and Rhythm: Normal rate and regular rhythm.     Heart sounds: Normal heart sounds.  Pulmonary:     Effort: Pulmonary effort is normal. No respiratory distress.     Breath sounds: Normal breath sounds. No wheezing or rales.  Chest:     Chest  wall: No tenderness.  Abdominal:     General: Bowel sounds are normal. There is no distension  or abdominal bruit.     Palpations: Abdomen is soft. There is no hepatomegaly, splenomegaly, mass or pulsatile mass.     Tenderness: There is no abdominal tenderness.  Musculoskeletal:        General: Normal range of motion.     Cervical back: Normal range of motion and neck supple.     Right lower leg: No edema.     Left lower leg: Edema (1+) present.  Lymphadenopathy:     Cervical: No cervical adenopathy.  Skin:    General: Skin is warm and dry.  Neurological:     Mental Status: She is alert and oriented to person, place, and time.     Deep Tendon Reflexes: Reflexes are normal and symmetric.  Psychiatric:        Behavior: Behavior normal.        Thought Content: Thought content normal.        Judgment: Judgment normal.    BP 118/78   Pulse 69   Temp 97.6 F (36.4 C) (Oral)   Ht 5' (1.524 m)   Wt 185 lb (83.9 kg)   SpO2 94%   BMI 36.13 kg/m        Assessment & Plan:  Colleene Amsler Tumminello comes in today with chief complaint of Medical Management of Chronic Issues, Hypertension, and Dysuria   Diagnosis and orders addressed:  1. Dysuria - Urine Culture - Urinalysis, Complete  2. Primary hypertension Low sodium diet  3. Pure hypertriglyceridemia Low fat diet - atorvastatin (LIPITOR) 40 MG tablet; Take 1 tablet (40 mg total) by mouth daily.  Dispense: 90 tablet; Refill: 1  4. Peripheral polyneuropathy Do not go barefooted  5. Recurrent major depressive disorder, in partial remission ([place]) Stress management  6. Obesity (BMI 30-39.9) Discussed diet and exercise for person with BMI >25 Will recheck weight in 3-6 months  7. Peripheral edema Elevate legs when sitting - furosemide (LASIX) 20 MG tablet; Take 1 tablet (20 mg total) by mouth daily.  Dispense: 90 tablet; Refill: 1  8. Chronic midline thoracic back pain - amitriptyline (ELAVIL) 25 MG tablet; Take 1 tablet (25 mg total) by mouth at bedtime.  Dispense: 90 tablet; Refill: 1 - oxyCODONE-acetaminophen  (PERCOCET) 7.5-325 MG tablet; Take 1 tablet by mouth every 8 (eight) hours as needed for severe pain.  Dispense: 90 tablet; Refill: 0 - oxyCODONE-acetaminophen (PERCOCET) 7.5-325 MG tablet; Take 1 tablet by mouth every 8 (eight) hours as needed for severe pain.  Dispense: 90 tablet; Refill: 0 - oxyCODONE-acetaminophen (PERCOCET) 7.5-325 MG tablet; Take 1 tablet by mouth every 8 (eight) hours as needed for severe pain.  Dispense: 90 tablet; Refill: 0  9. Acute cystitis without hematuria Take medication as prescribe Cotton underwear Take shower not bath Cranberry juice, yogurt Force fluids AZO over the counter X2 days Culture pending RTO prn  - doxycycline (VIBRA-TABS) 100 MG tablet; Take 1 tablet (100 mg total) by mouth 2 (two) times daily. 1 po bid  Dispense: 20 tablet; Refill: 0   Labs pending Health Maintenance reviewed Diet and exercise encouraged  Follow up plan: 3 months   Mary-Margaret Hassell Done, FNP

## 2020-12-07 NOTE — Addendum Note (Signed)
Addended by: Thana Ates on: 12/07/2020 10:30 AM   Modules accepted: Orders

## 2020-12-07 NOTE — Patient Instructions (Signed)

## 2020-12-09 LAB — URINE CULTURE

## 2020-12-15 ENCOUNTER — Other Ambulatory Visit: Payer: Self-pay

## 2020-12-15 ENCOUNTER — Ambulatory Visit: Payer: Self-pay | Admitting: Nurse Practitioner

## 2020-12-15 DIAGNOSIS — G8929 Other chronic pain: Secondary | ICD-10-CM

## 2021-01-08 ENCOUNTER — Encounter: Payer: Self-pay | Admitting: Gastroenterology

## 2021-01-10 ENCOUNTER — Other Ambulatory Visit: Payer: Self-pay

## 2021-01-10 DIAGNOSIS — G8929 Other chronic pain: Secondary | ICD-10-CM

## 2021-01-12 ENCOUNTER — Other Ambulatory Visit: Payer: Self-pay | Admitting: Nurse Practitioner

## 2021-01-12 NOTE — Telephone Encounter (Signed)
Patient should have 2 more prescriptions on fille to be filled

## 2021-01-20 DIAGNOSIS — N3020 Other chronic cystitis without hematuria: Secondary | ICD-10-CM | POA: Diagnosis not present

## 2021-01-20 DIAGNOSIS — R8271 Bacteriuria: Secondary | ICD-10-CM | POA: Diagnosis not present

## 2021-01-20 DIAGNOSIS — N135 Crossing vessel and stricture of ureter without hydronephrosis: Secondary | ICD-10-CM | POA: Diagnosis not present

## 2021-02-08 ENCOUNTER — Ambulatory Visit (AMBULATORY_SURGERY_CENTER): Payer: Medicare HMO

## 2021-02-08 ENCOUNTER — Other Ambulatory Visit: Payer: Self-pay

## 2021-02-08 ENCOUNTER — Encounter: Payer: Self-pay | Admitting: Gastroenterology

## 2021-02-08 VITALS — Ht 60.0 in | Wt 180.0 lb

## 2021-02-08 DIAGNOSIS — Z800 Family history of malignant neoplasm of digestive organs: Secondary | ICD-10-CM

## 2021-02-08 DIAGNOSIS — Z86010 Personal history of colonic polyps: Secondary | ICD-10-CM

## 2021-02-08 MED ORDER — PLENVU 140 G PO SOLR: 1.0000 | ORAL | 0 refills | Status: DC

## 2021-02-08 NOTE — Progress Notes (Signed)
Patient's pre-visit was done today over the phone with the patient due to COVID-19 pandemic. Name,DOB and address verified. Patient denies any allergies to Eggs and Soy. Patient denies any problems with anesthesia/sedation. Patient is not taking any diet pills or blood thinners. No home Oxygen. Packet of Prep instructions mailed to patient including a copy of a consent form-pt is aware. Patient understands to call us back with any questions or concerns. Patient is aware of our care-partner policy and [id number] safety protocol.    The patient is COVID-19 vaccinated.    Coupon was mailed to pt for PLENVU.

## 2021-02-15 DIAGNOSIS — H353131 Nonexudative age-related macular degeneration, bilateral, early dry stage: Secondary | ICD-10-CM | POA: Diagnosis not present

## 2021-02-15 DIAGNOSIS — Z961 Presence of intraocular lens: Secondary | ICD-10-CM | POA: Diagnosis not present

## 2021-02-17 ENCOUNTER — Other Ambulatory Visit: Payer: Self-pay | Admitting: Nurse Practitioner

## 2021-02-17 DIAGNOSIS — G8929 Other chronic pain: Secondary | ICD-10-CM

## 2021-02-17 DIAGNOSIS — M546 Pain in thoracic spine: Secondary | ICD-10-CM

## 2021-02-25 ENCOUNTER — Encounter: Payer: Self-pay | Admitting: Gastroenterology

## 2021-02-25 ENCOUNTER — Ambulatory Visit (AMBULATORY_SURGERY_CENTER): Payer: Medicare HMO | Admitting: Gastroenterology

## 2021-02-25 VITALS — BP 140/60 | HR 77 | Temp 97.1°F | Resp 13 | Ht 60.0 in | Wt 180.0 lb

## 2021-02-25 DIAGNOSIS — D123 Benign neoplasm of transverse colon: Secondary | ICD-10-CM

## 2021-02-25 DIAGNOSIS — I10 Essential (primary) hypertension: Secondary | ICD-10-CM | POA: Diagnosis not present

## 2021-02-25 DIAGNOSIS — Z86010 Personal history of colonic polyps: Secondary | ICD-10-CM

## 2021-02-25 MED ORDER — SODIUM CHLORIDE 0.9 % IV SOLN: 500.0000 mL | Freq: Once | INTRAVENOUS | Status: DC

## 2021-02-25 NOTE — Progress Notes (Signed)
History and Physical:  This patient presents for endoscopic testing for: Encounter Diagnosis  Name Primary?   Personal history of colonic polyps Yes    > 4cm cecal TVA requiring surgical removal 08/2019  ROS: Patient denies chest pain or cough   Past Medical History: Past Medical History:  Diagnosis Date   Arthritis    Cancer ([place])    ovarian,skin Ca. in mouth   Cataract    Bil cataracts removed   Chronic constipation    Chronic kidney disease    Chronic midline low back pain with sciatica    Colon polyp    large ascending colon polyp , scheduled for resection 02/ 2021   Full dentures    History of endometrial cancer 2012   s/p   TAH w/ BSO 11-02-2010   History of recurrent UTIs    History of sepsis    multiple times (some due to e.coli) last sepsis 07/ 2019   History of small bowel obstruction    x2  ,  hx bowel resection   HOH (hard of hearing)    Hyperlipidemia    Hypertension    Retroperitoneal fibrosis    Substance abuse ([place])    opiod dependancy   Ureteral obstruction, left urologist-- dr Alinda Money   secondary to adhesions/ retroperitoneal fibrosis, treated with ureteral stent   Vertigo    Wears glasses      Past Surgical History: Past Surgical History:  Procedure Laterality Date   ABDOMINAL HYSTERECTOMY  11/02/10  dr Alycia Rossetti  @[facility]    TAH, BSO, incisional hernia repair , lysis of adhesions for endometrial cancer   ANTERIOR LUMBAR FUSION  01-28-2008   dr elsner  @[facility]    L2 -- L4   BACK SURGERY     BUNIONECTOMY Right 2003   CATARACT EXTRACTION W/ INTRAOCULAR LENS  IMPLANT, BILATERAL  2007   COLONOSCOPY  last one 12-24-2018   CYSTO/  LEFT RETROGRADE PYELOGRAM/ URETEROSCOPY STENT PLACEMENT/  OPEN URETEROLYSIS WITH OMENTAL FLAP Left 03-11-2009    dr borden  @[facility]    CYSTO/  [facility] SLING/  ANTERIOR REPAIR  12-06-2001    dr Jeffie Pollock @[facility]    CYSTOSCOPY W/ URETERAL STENT PLACEMENT Left 12/07/2017   Procedure: CYSTOSCOPY WITH RETROGRADE PYELOGRAM/URETERAL STENT PLACEMENT;   Surgeon: Raynelle Bring, MD;  [facility];  Service: Urology;  Laterality: Left;   CYSTOSCOPY W/ URETERAL STENT PLACEMENT Left 03/29/2018   Procedure: CYSTOSCOPY WITH STENT  EXCHANGE;  Surgeon: Raynelle Bring, MD;  [facility];  Service: Urology;  Laterality: Left;   CYSTOSCOPY WITH RETROGRADE PYELOGRAM, URETEROSCOPY AND STENT PLACEMENT Left 12-29-2008   dr Alinda Money  @[facility]    WITH BALLOON DILATION LEFT URETERAL STRICTURE   CYSTOSCOPY WITH RETROGRADE PYELOGRAM, URETEROSCOPY AND STENT PLACEMENT Left 09-12-2008    dr Jeffie Pollock @[facility]    CYSTOSCOPY WITH RETROGRADE PYELOGRAM, URETEROSCOPY AND STENT PLACEMENT Left 03/23/2020   Procedure: CYSTOSCOPY WITH LEFT RETROGRADE PYELOGRAM, AND LEFT STENT REMOVAL;  Surgeon: Raynelle Bring, MD;  [facility];  Service: Urology;  Laterality: Left;   CYSTOSCOPY WITH STENT PLACEMENT Left 07/26/2018   Procedure: CYSTOSCOPY WITH STENT CHANGE;  Surgeon: Raynelle Bring, MD;  [facility];  Service: Urology;  Laterality: Left;   CYSTOSCOPY WITH STENT PLACEMENT Left 12/03/2018   Procedure: CYSTOSCOPY WITH STENT CHANGE;  Surgeon: Raynelle Bring, MD;  [facility];  Service: Urology;  Laterality: Left;   CYSTOSCOPY WITH STENT PLACEMENT Left 04/22/2019   Procedure: CYSTOSCOPY WITH STENT CHANGE;  Surgeon: Raynelle Bring, MD;  Location: [facility];  Service: Urology;  Laterality: Left;   CYSTOSCOPY WITH STENT PLACEMENT Left 10/03/2019   Procedure: CYSTOSCOPY WITH STENT CHANGE;  Surgeon: Raynelle Bring, MD;  [facility];  Service: Urology;  Laterality: Left;   EYE SURGERY     FOOT SURGERY Left 06-23-2010   dr Beola Cord   left first and second toes   INSERTION OF MESH N/A 04/23/2013   Procedure: INSERTION OF MESH;  Surgeon: Adin Hector, MD;  [facility];  Service: General;  Laterality: N/A;   JOINT REPLACEMENT     KNEE ARTHROSCOPY Right 04-27-2007  dr Shellia Carwin @[facility]    LAPAROSCOPIC LYSIS OF ADHESIONS N/A 04/23/2013   Procedure: LAPAROSCOPIC  LYSIS OF ADHESIONS;  Surgeon: Adin Hector, MD;  [facility];  Service: General;  Laterality: N/A;   [place]  12/17/2012   L3 -- L5 laminectomy and L3 -- 5 fusion   TOTAL HIP ARTHROPLASTY Left 07/04/2014   Procedure: LEFT TOTAL HIP ARTHROPLASTY ANTERIOR APPROACH;  Surgeon: Gearlean Alf, MD;  Location: [facility];  Service: Orthopedics;  Laterality: Left;   TOTAL HIP ARTHROPLASTY Right 2009   TOTAL KNEE ARTHROPLASTY Right 09-15-2009   dr Shellia Carwin @[facility]    VENTRAL HERNIA REPAIR N/A 07/19/2012   Procedure: LAPAROSCOPIC LYSIS OF ADHESIONS, SMALL BOWEL RESECTION, SEROSAL REPAIR, PRIMARY VENTRAL HERNIA REPAIR;  Surgeon: Adin Hector, MD;  [facility];  Service: General;  Laterality: N/A;   VENTRAL HERNIA REPAIR N/A 04/23/2013   Procedure: LAPAROSCOPIC exploration and repair of hernia in abdominal VENTRAL wall  HERNIA;  Surgeon: Adin Hector, MD;  [facility];  Service: General;  Laterality: N/A;    Allergies: Allergies  Allergen Reactions   Cashew Nut Oil Hives   Dog Epithelium Hives and Itching   Sulfa Antibiotics     "blistering lips and mouth sores"    Outpatient Meds: Current Outpatient Medications  Medication Sig Dispense Refill   amitriptyline (ELAVIL) 25 MG tablet Take 1 tablet (25 mg total) by mouth at bedtime. 90 tablet 1   cephALEXin (KEFLEX) 500 MG capsule Take 500 mg by mouth daily.     furosemide (LASIX) 20 MG tablet Take 1 tablet (20 mg total) by mouth daily. 90 tablet 1   gabapentin (NEURONTIN) 300 MG capsule TAKE 1 CAPSULE BY MOUTH THREE TIMES DAILY 90 capsule 0   lisinopril (ZESTRIL) 20 MG tablet Take 1 tablet (20 mg total) by mouth daily. 90 tablet 1   methocarbamol (ROBAXIN) 500 MG tablet TAKE 1 TABLET BY MOUTH AT BEDTIME 30 tablet 1   Multiple Vitamins-Minerals (PRESERVISION AREDS) CAPS Take 1 capsule by mouth in the morning and at bedtime.     oxyCODONE-acetaminophen (PERCOCET) 7.5-325 MG tablet  Take 1 tablet by mouth every 8 (eight) hours as needed for severe pain. 90 tablet 0   meloxicam (MOBIC) 7.5 MG tablet Take 1 tablet (7.5 mg total) by mouth daily. 90 tablet 1   MODERNA COVID-19 VACCINE 100 MCG/0.5ML injection      nystatin cream (MYCOSTATIN) APPLY 1 APPLICATION TOPICALLY 2 TIMES A DAY 30 g 0   Current Facility-Administered Medications  Medication Dose Route Frequency Provider Last Rate Last Admin   0.9 %  sodium chloride infusion  500 mL Intravenous Once Nelida Meuse III, MD          ___________________________________________________________________ Objective   Exam:  BP (!) 156/89 (BP Location: Right Arm, Patient Position: Sitting, Cuff Size: Normal)  Pulse 96   Temp (!) 97.1 F (36.2 C) (Temporal)   Ht 5' (1.524 m)   Wt 180 lb (81.6 kg)   SpO2 98%   BMI 35.15 kg/m   CV: RRR without murmur, S1/S2 Resp: clear to auscultation bilaterally, normal RR and effort noted GI: soft, no tenderness, with active bowel sounds.   Assessment: Encounter Diagnosis  Name Primary?   Personal history of colonic polyps Yes     Plan: Colonoscopy  The benefits and risks of the planned procedure were described in detail with the patient or (when appropriate) their health care proxy.  Risks were outlined as including, but not limited to, bleeding, infection, perforation, adverse medication reaction leading to cardiac or pulmonary decompensation, pancreatitis (if ERCP).  The limitation of incomplete mucosal visualization was also discussed.  No guarantees or warranties were given.    The patient is appropriate for an endoscopic procedure in the ambulatory setting.   - Wilfrid Lund, MD

## 2021-02-25 NOTE — Patient Instructions (Addendum)
Thank you for allowing [place] to care for you today! Await biopsy results of the polyp removed, approximately 2 weeks.  Dr Loletha Carrow will make his recommendation at that time for a future colonoscopy. Resume your diet and medications today. Return to your normal daily activities tomorrow.   YOU HAD AN ENDOSCOPIC PROCEDURE TODAY AT [facility]:   Refer to the procedure report that was given to you for any specific questions about what was found during the examination.  If the procedure report does not answer your questions, please call your gastroenterologist to clarify.  If you requested that your care partner not be given the details of your procedure findings, then the procedure report has been included in a sealed envelope for you to review at your convenience later.  YOU SHOULD EXPECT: Some feelings of bloating in the abdomen. Passage of more gas than usual.  Walking can help get rid of the air that was put into your GI tract during the procedure and reduce the bloating. If you had a lower endoscopy (such as a colonoscopy or flexible sigmoidoscopy) you may notice spotting of blood in your stool or on the toilet paper. If you underwent a bowel prep for your procedure, you may not have a normal bowel movement for a few days.  Please Note:  You might notice some irritation and congestion in your nose or some drainage.  This is from the oxygen used during your procedure.  There is no need for concern and it should clear up in a day or so.  SYMPTOMS TO REPORT IMMEDIATELY:  Following lower endoscopy (colonoscopy or flexible sigmoidoscopy):  Excessive amounts of blood in the stool  Significant tenderness or worsening of abdominal pains  Swelling of the abdomen that is new, acute  Fever of 100F or higher    For urgent or emergent issues, a gastroenterologist can be reached at any hour by calling [phone]. Do not use MyChart messaging for urgent concerns.    DIET:  We do  recommend a small meal at first, but then you may proceed to your regular diet.  Drink plenty of fluids but you should avoid alcoholic beverages for 24 hours.  ACTIVITY:  You should plan to take it easy for the rest of today and you should NOT DRIVE or use heavy machinery until tomorrow (because of the sedation medicines used during the test).    FOLLOW UP: Our staff will call the number listed on your records 48-72 hours following your procedure to check on you and address any questions or concerns that you may have regarding the information given to you following your procedure. If we do not reach you, we will leave a message.  We will attempt to reach you two times.  During this call, we will ask if you have developed any symptoms of COVID 19. If you develop any symptoms (ie: fever, flu-like symptoms, shortness of breath, cough etc.) before then, please call [phone].  If you test positive for Covid 19 in the 2 weeks post procedure, please call and report this information to [place].    If any biopsies were taken you will be contacted by phone or by letter within the next 1-3 weeks.  Please call us at [phone] if you have not heard about the biopsies in 3 weeks.    SIGNATURES/CONFIDENTIALITY: You and/or your care partner have signed paperwork which will be entered into your electronic medical record.  These signatures attest to the fact that  that the information above on your After Visit Summary has been reviewed and is understood.  Full responsibility of the confidentiality of this discharge information lies with you and/or your care-partner.

## 2021-02-25 NOTE — Progress Notes (Signed)
A/ox3, pleased with MAC, report to RN 

## 2021-02-25 NOTE — Progress Notes (Signed)
Called to room to assist during endoscopic procedure.  Patient ID and intended procedure confirmed with present staff. Received instructions for my participation in the procedure from the performing physician.  

## 2021-02-25 NOTE — Progress Notes (Signed)
Vitals-JD  Pt's states no medical or surgical changes since previsit or office visit. 

## 2021-02-25 NOTE — Op Note (Signed)
[facility] Patient Name: [name] Procedure Date: 02/25/2021 11:23 AM MRN: [id number] Endoscopist: Mallie Mussel L. 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 The patient's tolerance of                            previous anesthesia was also reviewed. The risks                            and benefits of the procedure and the sedation                            options and risks were discussed with the patient.                            All questions were answered, and informed consent                            was obtained. Prior Anticoagulants: The patient has                            taken no previous anticoagulant or antiplatelet                            agents. ASA Grade Assessment: II - A patient with                            mild systemic disease. After reviewing the risks  and benefits, the patient was deemed in                            satisfactory condition to undergo the procedure.                           After obtaining informed consent, the colonoscope                            was passed under direct vision. Throughout the                            procedure, the patient's blood pressure, pulse, and                            oxygen saturations were monitored continuously. The                             Colonoscope was introduced through the anus and                            advanced to the the ileocolonic anastomosis. The                            colonoscopy was performed without difficulty. The                            patient tolerated the procedure well. The quality                            of the bowel preparation was good. The rectum and                            ileo-colonic anastomosis were photographed. Scope In: 11:31:10 AM Scope Out: 11:45:10 AM Scope Withdrawal Time: 0 hours 11 minutes 47 seconds  Total Procedure Duration: 0 hours 14 minutes 0 seconds  Findings:                 The digital rectal exam findings include decreased                            sphincter tone.                           There was evidence of a prior side-to-side                            ileo-colonic anastomosis in the distal ascending                            colon. This was patent and was characterized by                            healthy appearing mucosa.  A 12-15 mm polyp was found in the proximal                            transverse colon. The polyp was semi-sessile. The                            polyp was removed with a piecemeal technique using                            a cold biopsy forceps. Resection and retrieval were                            complete.                           The exam was otherwise without abnormality on                            direct and retroflexion views. Complications:            No immediate complications. Estimated Blood Loss:     Estimated blood loss was minimal. Impression:               - Decreased sphincter tone found on digital rectal                            exam.                           - Patent side-to-side ileo-colonic anastomosis,                            characterized by healthy appearing mucosa.                           - One 12 mm polyp in the proximal transverse colon,                             removed piecemeal using a cold biopsy forceps.                            Resected and retrieved.                           - The examination was otherwise normal on direct                            and retroflexion views. Recommendation:           - Patient has a contact number available for                            emergencies. The signs and symptoms of potential                            delayed complications were discussed with the  patient. Return to normal activities tomorrow.                            Written discharge instructions were provided to the                            patient.                           - Resume previous diet.                           - Continue present medications.                           - Await pathology results.                           - Repeat colonoscopy is recommended for                            surveillance. The colonoscopy date will be                            determined after pathology results from today's                            exam become available for review. Brannan Cassedy L. Loletha Carrow, MD 02/25/2021 11:52:03 AM This report has been signed electronically.

## 2021-03-01 ENCOUNTER — Telehealth: Payer: Self-pay

## 2021-03-01 NOTE — Telephone Encounter (Signed)
  Follow up Call-  Call back number 02/25/2021 12/24/2018  Post procedure Call Back phone  # [phone]  Permission to leave phone message Yes Yes  Some recent data might be hidden     Patient questions:  Do you have a fever, pain , or abdominal swelling? No. Pain Score  0 *  Have you tolerated food without any problems? Yes.    Have you been able to return to your normal activities? Yes.    Do you have any questions about your discharge instructions: Diet   No. Medications  No. Follow up visit  No.  Do you have questions or concerns about your Care? No.  Actions: * If pain score is 4 or above: No action needed, pain <4.

## 2021-03-03 ENCOUNTER — Encounter: Payer: Self-pay | Admitting: Gastroenterology

## 2021-03-09 ENCOUNTER — Encounter: Payer: Self-pay | Admitting: Nurse Practitioner

## 2021-03-09 ENCOUNTER — Other Ambulatory Visit: Payer: Self-pay

## 2021-03-09 ENCOUNTER — Ambulatory Visit (INDEPENDENT_AMBULATORY_CARE_PROVIDER_SITE_OTHER): Payer: Medicare HMO | Admitting: Nurse Practitioner

## 2021-03-09 VITALS — BP 118/62 | HR 61 | Temp 97.9°F | Resp 20 | Ht 60.0 in | Wt 184.0 lb

## 2021-03-09 DIAGNOSIS — G629 Polyneuropathy, unspecified: Secondary | ICD-10-CM | POA: Diagnosis not present

## 2021-03-09 DIAGNOSIS — G8929 Other chronic pain: Secondary | ICD-10-CM | POA: Diagnosis not present

## 2021-03-09 DIAGNOSIS — R69 Illness, unspecified: Secondary | ICD-10-CM | POA: Diagnosis not present

## 2021-03-09 DIAGNOSIS — E669 Obesity, unspecified: Secondary | ICD-10-CM | POA: Diagnosis not present

## 2021-03-09 DIAGNOSIS — D122 Benign neoplasm of ascending colon: Secondary | ICD-10-CM

## 2021-03-09 DIAGNOSIS — Z23 Encounter for immunization: Secondary | ICD-10-CM

## 2021-03-09 DIAGNOSIS — M546 Pain in thoracic spine: Secondary | ICD-10-CM | POA: Diagnosis not present

## 2021-03-09 DIAGNOSIS — R609 Edema, unspecified: Secondary | ICD-10-CM | POA: Diagnosis not present

## 2021-03-09 DIAGNOSIS — E781 Pure hyperglyceridemia: Secondary | ICD-10-CM | POA: Diagnosis not present

## 2021-03-09 DIAGNOSIS — F3341 Major depressive disorder, recurrent, in partial remission: Secondary | ICD-10-CM

## 2021-03-09 DIAGNOSIS — I10 Essential (primary) hypertension: Secondary | ICD-10-CM

## 2021-03-09 DIAGNOSIS — Z79891 Long term (current) use of opiate analgesic: Secondary | ICD-10-CM | POA: Diagnosis not present

## 2021-03-09 LAB — CBC WITH DIFFERENTIAL/PLATELET
Basophils Absolute: 0 10*3/uL (ref 0.0–0.2)
Basos: 1 %
EOS (ABSOLUTE): 0.2 10*3/uL (ref 0.0–0.4)
Eos: 2 %
Hematocrit: 39.6 % (ref 34.0–46.6)
Hemoglobin: 13.4 g/dL (ref 11.1–15.9)
Immature Grans (Abs): 0 10*3/uL (ref 0.0–0.1)
Immature Granulocytes: 0 %
Lymphocytes Absolute: 2.8 10*3/uL (ref 0.7–3.1)
Lymphs: 37 %
MCH: 31.2 pg (ref 26.6–33.0)
MCHC: 33.8 g/dL (ref 31.5–35.7)
MCV: 92 fL (ref 79–97)
Monocytes Absolute: 0.5 10*3/uL (ref 0.1–0.9)
Monocytes: 7 %
Neutrophils Absolute: 4.2 10*3/uL (ref 1.4–7.0)
Neutrophils: 53 %
Platelets: 306 10*3/uL (ref 150–450)
RBC: 4.3 x10E6/uL (ref 3.77–5.28)
RDW: 12.7 % (ref 11.7–15.4)
WBC: 7.8 10*3/uL (ref 3.4–10.8)

## 2021-03-09 LAB — CMP14+EGFR
ALT: 16 IU/L (ref 0–32)
AST: 23 IU/L (ref 0–40)
Albumin/Globulin Ratio: 1.6 (ref 1.2–2.2)
Albumin: 4.3 g/dL (ref 3.7–4.7)
Alkaline Phosphatase: 90 IU/L (ref 44–121)
BUN/Creatinine Ratio: 8 — ABNORMAL LOW (ref 12–28)
BUN: 7 mg/dL — ABNORMAL LOW (ref 8–27)
Bilirubin Total: 0.5 mg/dL (ref 0.0–1.2)
CO2: 25 mmol/L (ref 20–29)
Calcium: 9.1 mg/dL (ref 8.7–10.3)
Chloride: 102 mmol/L (ref 96–106)
Creatinine, Ser: 0.93 mg/dL (ref 0.57–1.00)
Globulin, Total: 2.7 g/dL (ref 1.5–4.5)
Glucose: 101 mg/dL — ABNORMAL HIGH (ref 70–99)
Potassium: 4.7 mmol/L (ref 3.5–5.2)
Sodium: 138 mmol/L (ref 134–144)
Total Protein: 7 g/dL (ref 6.0–8.5)
eGFR: 64 mL/min/{1.73_m2} (ref >59)

## 2021-03-09 LAB — LIPID PANEL
Chol/HDL Ratio: 3.7 ratio (ref 0.0–4.4)
Cholesterol, Total: 203 mg/dL — ABNORMAL HIGH (ref 100–199)
HDL: 55 mg/dL (ref >39)
LDL Chol Calc (NIH): 121 mg/dL — ABNORMAL HIGH (ref 0–99)
Triglycerides: 151 mg/dL — ABNORMAL HIGH (ref 0–149)
VLDL Cholesterol Cal: 27 mg/dL (ref 5–40)

## 2021-03-09 MED ORDER — OXYCODONE-ACETAMINOPHEN 7.5-325 MG PO TABS: 1.0000 | ORAL_TABLET | Freq: Three times a day (TID) | ORAL | 0 refills | Status: AC | PRN

## 2021-03-09 MED ORDER — NYSTATIN 100000 UNIT/GM EX CREA: TOPICAL_CREAM | Freq: Two times a day (BID) | CUTANEOUS | 2 refills | Status: DC

## 2021-03-09 MED ORDER — LISINOPRIL 20 MG PO TABS: 20.0000 mg | ORAL_TABLET | Freq: Every day | ORAL | 1 refills | Status: DC

## 2021-03-09 MED ORDER — OXYCODONE-ACETAMINOPHEN 7.5-325 MG PO TABS: 1.0000 | ORAL_TABLET | Freq: Three times a day (TID) | ORAL | 0 refills | Status: DC | PRN

## 2021-03-09 MED ORDER — GABAPENTIN 300 MG PO CAPS: 300.0000 mg | ORAL_CAPSULE | Freq: Three times a day (TID) | ORAL | 1 refills | Status: DC

## 2021-03-09 NOTE — Addendum Note (Signed)
Addended by: Brynda Peon F on: 03/09/2021 11:19 AM   Modules accepted: Orders

## 2021-03-09 NOTE — Patient Instructions (Signed)
Stress, Adult Stress is a normal reaction to life events. Stress is what you feel when life demands more than you are used to, or more than you think you can handle. Some stress can be useful, such as studying for a test or meeting a deadline at work. Stress that occurs too often or for too long can cause problems. It can affect your emotional health and interfere with relationships and normal daily activities. Too much stress can weaken your body's defense system (immune system) and increase your risk for physical illness. If you already have a medical problem, stress can make it worse. What are the causes? All sorts of life events can cause stress. An event that causes stress for one person may not be stressful for another person. Major life events, whether positive or negative, commonly cause stress. Examples include: Losing a job or starting a new job. Losing a loved one. Moving to a new town or home. Getting married or divorced. Having a baby. Getting injured or sick. Less obvious life events can also cause stress, especially if they occur day after day or in combination with each other. Examples include: Working long hours. Driving in traffic. Caring for children. Being in debt. Being in a difficult relationship. What are the signs or symptoms? Stress can cause emotional symptoms, including: Anxiety. This is feeling worried, afraid, on edge, overwhelmed, or out of control. Anger, including irritation or impatience. Depression. This is feeling sad, down, helpless, or guilty. Trouble focusing, remembering, or making decisions. Stress can cause physical symptoms, including: Aches and pains. These may affect your head, neck, back, stomach, or other areas of your body. Tight muscles or a clenched jaw. Low energy. Trouble sleeping. Stress can cause unhealthy behaviors, including: Eating to feel better (overeating) or skipping meals. Working too much or putting off tasks. Smoking,  drinking alcohol, or using drugs to feel better. How is this diagnosed? Stress is diagnosed through an assessment by your health care provider. He or she may diagnose this condition based on: Your symptoms and any stressful life events. Your medical history. Tests to rule out other causes of your symptoms. Depending on your condition, your health care provider may refer you to a specialist for further evaluation. How is this treated? Stress management techniques are the recommended treatment for stress. Medicine is not typically recommended for the treatment of stress. Techniques to reduce your reaction to stressful life events include: Stress identification. Monitor yourself for symptoms of stress and identify what causes stress for you. These skills may help you to avoid or prepare for stressful events. Time management. Set your priorities, keep a calendar of events, and learn to say no. Taking these actions can help you avoid making too many commitments. Techniques for coping with stress include: Rethinking the problem. Try to think realistically about stressful events rather than ignoring them or overreacting. Try to find the positives in a stressful situation rather than focusing on the negatives. Exercise. Physical exercise can release both physical and emotional tension. The key is to find a form of exercise that you enjoy and do it regularly. Relaxation techniques. These relax the body and mind. The key is to find one or more that you enjoy and use the techniques regularly. Examples include: Meditation, deep breathing, or progressive relaxation techniques. Yoga or tai chi. Biofeedback, mindfulness techniques, or journaling. Listening to music, being out in nature, or participating in other hobbies. Practicing a healthy lifestyle. Eat a balanced diet, drink plenty of water, limit or  avoid caffeine, and get plenty of sleep. Having a strong support network. Spend time with family, friends,  or other people you enjoy being around. Express your feelings and talk things over with someone you trust. Counseling or talk therapy with a mental health professional may be helpful if you are having trouble managing stress on your own. Follow these instructions at home: Lifestyle  Avoid drugs. Do not use any products that contain nicotine or tobacco, such as cigarettes, e-cigarettes, and chewing tobacco. If you need help quitting, ask your health care provider. Limit alcohol intake to no more than 1 drink a day for nonpregnant women and 2 drinks a day for men. One drink equals 12 oz of beer, 5 oz of wine, or 1 oz of hard liquor Do not use alcohol or drugs to relax. Eat a balanced diet that includes fresh fruits and vegetables, whole grains, lean meats, fish, eggs, and beans, and low-fat dairy. Avoid processed foods and foods high in added fat, sugar, and salt. Exercise at least 30 minutes on 5 or more days each week. Get 7-8 hours of sleep each night. General instructions  Practice stress management techniques as discussed with your health care provider. Drink enough fluid to keep your urine clear or pale yellow. Take over-the-counter and prescription medicines only as told by your health care provider. Keep all follow-up visits as told by your health care provider. This is important. Contact a health care provider if: Your symptoms get worse. You have new symptoms. You feel overwhelmed by your problems and can no longer manage them on your own. Get help right away if: You have thoughts of hurting yourself or others. If you ever feel like you may hurt yourself or others, or have thoughts about taking your own life, get help right away. You can go to your nearest emergency department or call: Your local emergency services (911 in the [place] A suicide crisis helpline, such as the [place] at (410)025-6148. This is open 24 hours a day. Summary Stress is a  normal reaction to life events. It can cause problems if it happens too often or for too long. Practicing stress management techniques is the best way to treat stress. Counseling or talk therapy with a mental health professional may be helpful if you are having trouble managing stress on your own. This information is not intended to replace advice given to you by your health care provider. Make sure you discuss any questions you have with your health care provider. Document Revised: 07/03/2020 Document Reviewed: 01/10/2020 Elsevier Patient Education  2022 [employer].

## 2021-03-09 NOTE — Progress Notes (Signed)
Subjective:    Patient ID: [name], female    DOB: [birth year], 75 y.o.   MRN: [id number]  Chief Complaint: medical management of chronic issues     HPI:  1. Primary hypertension No c/o chest pain, sob or headache. Does not chec bloodpressure at home. BP Readings from Last 3 Encounters:  02/25/21 140/60  12/07/20 118/78  09/15/20 137/70     2. Pure hypertriglyceridemia Does try to watch diet but does no dedicated exercise. Patient says statins make her hurt. Lab Results  Component Value Date   CHOL 209 (H) 12/07/2020   HDL 49 12/07/2020   LDLCALC 131 (H) 12/07/2020   TRIG 163 (H) 12/07/2020   CHOLHDL 4.3 12/07/2020     3. Peripheral edema Has lower ext edema daily by the end of the day.  4. Adenomatous polyp of ascending colon Repeat colonoscopy was is due in 2025.  5. Peripheral polyneuropathy Bil feet are numb and tingling. She is on neurontin and elavil daily which do help  6. Recurrent major depressive disorder, in partial remission ([place]) Sheis currently not on antidepressant. She is doing well. Depression screen [facility] 2/9 03/09/2021 12/07/2020 09/15/2020  Decreased Interest 0 0 0  Down, Depressed, Hopeless 0 0 0  PHQ - 2 Score 0 0 0  Altered sleeping 0 - 0  Tired, decreased energy 1 - 0  Change in appetite 0 - 0  Feeling bad or failure about yourself  0 - 0  Trouble concentrating 0 - 0  Moving slowly or fidgety/restless 0 - 0  Suicidal thoughts 0 - 0  PHQ-9 Score 1 - 0  Difficult doing work/chores Not difficult at all - Not difficult at all  Some recent data might be hidden     7. Chronic midline thoracic back pain Pain assessment: Cause of pain- DDD Pain location- thoracic spine Pain on scale of 1-10- 4/10 currently Frequency- daily What increases pain-to much activity What makes pain Better-rest Effects on ADL - none Any change in general medical condition-none  Current opioids rx- percocet # meds rx- tid Effectiveness of current  meds-helps Adverse reactions from pain meds-none Morphine equivalent- 33.75MME  Pill count performed-No Last drug screen - none ( high risk q30m moderate risk q689mlow risk yearly ) Urine drug screen today- Yes Was the [place]- yes  If yes were their any concerning findings? - none   Overdose risk: 1 Opioid Risk  09/15/2020  Alcohol 0  Illegal Drugs 0  Rx Drugs 0  Alcohol 0  Illegal Drugs 0  Rx Drugs 0  Age between 16-45 years  0  History of Preadolescent Sexual Abuse 0  Psychological Disease 0  Depression 0  Opioid Risk Tool Scoring 0  Opioid Risk Interpretation Low Risk     Pain contract signed on:03/09/21   8. Obesity (BMI 30-39.9) Weight is up 4 lbs Wt Readings from Last 3 Encounters:  03/09/21 184 lb (83.5 kg)  02/25/21 180 lb (81.6 kg)  02/08/21 180 lb (81.6 kg)   BMI Readings from Last 3 Encounters:  03/09/21 35.94 kg/m  02/25/21 35.15 kg/m  02/08/21 35.15 kg/m      Outpatient Encounter Medications as of 03/09/2021  Medication Sig   amitriptyline (ELAVIL) 25 MG tablet Take 1 tablet (25 mg total) by mouth at bedtime.   cephALEXin (KEFLEX) 500 MG capsule Take 500 mg by mouth daily.   furosemide (LASIX) 20 MG tablet Take 1 tablet (20 mg total) by mouth daily.  gabapentin (NEURONTIN) 300 MG capsule TAKE 1 CAPSULE BY MOUTH THREE TIMES DAILY   lisinopril (ZESTRIL) 20 MG tablet Take 1 tablet (20 mg total) by mouth daily.   meloxicam (MOBIC) 7.5 MG tablet Take 1 tablet (7.5 mg total) by mouth daily.   methocarbamol (ROBAXIN) 500 MG tablet TAKE 1 TABLET BY MOUTH AT BEDTIME   MODERNA COVID-19 VACCINE 100 MCG/0.5ML injection    Multiple Vitamins-Minerals (PRESERVISION AREDS) CAPS Take 1 capsule by mouth in the morning and at bedtime.   nystatin cream (MYCOSTATIN) APPLY 1 APPLICATION TOPICALLY 2 TIMES A DAY   No facility-administered encounter medications on file as of 03/09/2021.    Past Surgical History:  Procedure Laterality Date   ABDOMINAL  HYSTERECTOMY  11/02/10  dr Alycia Rossetti  _0    TAH, BSO, incisional hernia repair , lysis of adhesions for endometrial cancer   ANTERIOR LUMBAR FUSION  01-28-2008   dr elsner  _1    L2 -- L4   BACK SURGERY     BUNIONECTOMY Right 2003   CATARACT EXTRACTION W/ INTRAOCULAR LENS  IMPLANT, BILATERAL  2007   COLONOSCOPY  last one 12-24-2018   CYSTO/  LEFT RETROGRADE PYELOGRAM/ URETEROSCOPY STENT PLACEMENT/  OPEN URETEROLYSIS WITH OMENTAL FLAP Left 03-11-2009    dr borden  _2    CYSTO/  [facility]/  ANTERIOR REPAIR  12-06-2001    dr Jeffie Pollock _3    CYSTOSCOPY W/ URETERAL STENT PLACEMENT Left 12/07/2017   Procedure: CYSTOSCOPY WITH RETROGRADE PYELOGRAM/URETERAL STENT PLACEMENT;  Surgeon: Raynelle Bring, MD;  Location: [facility];  Service: Urology;  Laterality: Left;   CYSTOSCOPY W/ URETERAL STENT PLACEMENT Left 03/29/2018   Procedure: CYSTOSCOPY WITH STENT  EXCHANGE;  Surgeon: Raynelle Bring, MD;  Location: [facility];  Service: Urology;  Laterality: Left;   CYSTOSCOPY WITH RETROGRADE PYELOGRAM, URETEROSCOPY AND STENT PLACEMENT Left 12-29-2008   dr Alinda Money  _4    WITH BALLOON DILATION LEFT URETERAL STRICTURE   CYSTOSCOPY WITH RETROGRADE PYELOGRAM, URETEROSCOPY AND STENT PLACEMENT Left 09-12-2008    dr Jeffie Pollock _5    CYSTOSCOPY WITH RETROGRADE PYELOGRAM, URETEROSCOPY AND STENT PLACEMENT Left 03/23/2020   Procedure: CYSTOSCOPY WITH LEFT RETROGRADE PYELOGRAM, AND LEFT STENT REMOVAL;  Surgeon: Raynelle Bring, MD;  Location: [facility];  Service: Urology;  Laterality: Left;   CYSTOSCOPY WITH STENT PLACEMENT Left 07/26/2018   Procedure: CYSTOSCOPY WITH STENT CHANGE;  Surgeon: Raynelle Bring, MD;  Location: [facility];  Service: Urology;  Laterality: Left;   CYSTOSCOPY WITH STENT PLACEMENT Left 12/03/2018   Procedure: CYSTOSCOPY WITH STENT CHANGE;  Surgeon: Raynelle Bring, MD;  Location: [facility];  Service: Urology;  Laterality: Left;   CYSTOSCOPY WITH STENT PLACEMENT Left 04/22/2019   Procedure: CYSTOSCOPY WITH STENT CHANGE;   Surgeon: Raynelle Bring, MD;  Location: [facility];  Service: Urology;  Laterality: Left;   CYSTOSCOPY WITH STENT PLACEMENT Left 10/03/2019   Procedure: CYSTOSCOPY WITH STENT CHANGE;  Surgeon: Raynelle Bring, MD;  Location: [facility];  Service: Urology;  Laterality: Left;   EYE SURGERY     FOOT SURGERY Left 06-23-2010   dr Beola Cord   left first and second toes   INSERTION OF MESH N/A 04/23/2013   Procedure: INSERTION OF MESH;  Surgeon: Adin Hector, MD;  Location: [facility];  Service: General;  Laterality: N/A;   JOINT REPLACEMENT     KNEE ARTHROSCOPY Right 04-27-2007  dr Shellia Carwin _6    LAPAROSCOPIC LYSIS OF ADHESIONS N/A 04/23/2013   Procedure: LAPAROSCOPIC LYSIS OF ADHESIONS;  Surgeon: Adin Hector, MD;  Location: [facility];  Service: General;  Laterality: N/A;   LUMBAR SPINE SURGERY  1989 and [place]  12/17/2012   L3 -- L5 laminectomy and L3 -- 5 fusion   TOTAL HIP ARTHROPLASTY Left 07/04/2014   Procedure: LEFT TOTAL HIP ARTHROPLASTY ANTERIOR APPROACH;  Surgeon: Gearlean Alf, MD;  Location: [place];  Service: Orthopedics;  Laterality: Left;   TOTAL HIP ARTHROPLASTY Right 2009   TOTAL KNEE ARTHROPLASTY Right 09-15-2009   dr Shellia Carwin _0    VENTRAL HERNIA REPAIR N/A 07/19/2012   Procedure: LAPAROSCOPIC LYSIS OF ADHESIONS, SMALL BOWEL RESECTION, SEROSAL REPAIR, PRIMARY VENTRAL HERNIA REPAIR;  Surgeon: Adin Hector, MD;  Location: [facility];  Service: General;  Laterality: N/A;   VENTRAL HERNIA REPAIR N/A 04/23/2013   Procedure: LAPAROSCOPIC exploration and repair of hernia in abdominal VENTRAL wall  HERNIA;  Surgeon: Adin Hector, MD;  Location: [facility];  Service: General;  Laterality: N/A;    Family History  Problem Relation Age of Onset   Diabetes Mother    Lung cancer Mother    Diabetes Father    Liver cancer Father    Colon cancer Sister        close to 70 per pt   Diabetes Sister    Diabetes Sister    Hypertension Sister    Kidney disease  Sister    Seizures Daughter    Diabetes Son    Breast cancer Neg Hx    Allergic rhinitis Neg Hx    Angioedema Neg Hx    Asthma Neg Hx    Atopy Neg Hx    Eczema Neg Hx    Immunodeficiency Neg Hx    Urticaria Neg Hx    Colon polyps Neg Hx    Esophageal cancer Neg Hx    Rectal cancer Neg Hx    Stomach cancer Neg Hx     New complaints: None today  Social history: Lives with his sons family    Review of Systems  Constitutional:  Negative for diaphoresis.  Eyes:  Negative for pain.  Respiratory:  Negative for shortness of breath.   Cardiovascular:  Negative for chest pain, palpitations and leg swelling.  Gastrointestinal:  Negative for abdominal pain.  Endocrine: Negative for polydipsia.  Skin:  Negative for rash.  Neurological:  Negative for dizziness, weakness and headaches.  Hematological:  Does not bruise/bleed easily.  All other systems reviewed and are negative.     Objective:   Physical Exam Vitals and nursing note reviewed.  Constitutional:      General: She is not in acute distress.    Appearance: Normal appearance. She is well-developed.  HENT:     Head: Normocephalic.     Right Ear: Tympanic membrane normal.     Left Ear: Tympanic membrane normal.     Nose: Nose normal.     Mouth/Throat:     Mouth: Mucous membranes are moist.  Eyes:     Pupils: Pupils are equal, round, and reactive to light.  Neck:     Vascular: No carotid bruit or JVD.  Cardiovascular:     Rate and Rhythm: Normal rate and regular rhythm.     Heart sounds: Normal heart sounds.  Pulmonary:     Effort: Pulmonary effort is normal. No respiratory distress.     Breath sounds: Normal breath sounds. No wheezing or rales.  Chest:     Chest wall: No tenderness.  Abdominal:     General: Bowel sounds are normal. There is no  distension or abdominal bruit.     Palpations: Abdomen is soft. There is no hepatomegaly, splenomegaly, mass or pulsatile mass.     Tenderness: There is no abdominal  tenderness.  Musculoskeletal:        General: Normal range of motion.     Cervical back: Normal range of motion and neck supple.     Comments: rises slowly form sitting to standing  Lymphadenopathy:     Cervical: No cervical adenopathy.  Skin:    General: Skin is warm and dry.  Neurological:     Mental Status: She is alert and oriented to person, place, and time.     Deep Tendon Reflexes: Reflexes are normal and symmetric.  Psychiatric:        Behavior: Behavior normal.        Thought Content: Thought content normal.        Judgment: Judgment normal.    BP 118/62   Pulse 61   Temp 97.9 F (36.6 C) (Temporal)   Resp 20   Ht 5' (1.524 m)   Wt 184 lb (83.5 kg)   SpO2 96%   BMI 35.94 kg/m        Assessment & Plan:   [name] comes in today with chief complaint of Medical Management of Chronic Issues   Diagnosis and orders addressed:  1. Primary hypertension Low sodium diet - lisinopril (ZESTRIL) 20 MG tablet; Take 1 tablet (20 mg total) by mouth daily.  Dispense: 90 tablet; Refill: 1 - CBC with Differential/Platelet - CMP14+EGFR  2. Pure hypertriglyceridemia Low fat diet - Lipid panel  3. Peripheral edema Elevate legs when sitting  4. Adenomatous polyp of ascending colon Colonoscopy due in 2025  5. Peripheral polyneuropathy Do not go barefooted  6. Recurrent major depressive disorder, in partial remission ([place]) Stress management  7. Chronic midline thoracic back pain - oxyCODONE-acetaminophen (PERCOCET) 7.5-325 MG tablet; Take 1 tablet by mouth every 8 (eight) hours as needed for severe pain.  Dispense: 90 tablet; Refill: 0 - oxyCODONE-acetaminophen (PERCOCET) 7.5-325 MG tablet; Take 1 tablet by mouth every 8 (eight) hours as needed for severe pain.  Dispense: 90 tablet; Refill: 0 - oxyCODONE-acetaminophen (PERCOCET) 7.5-325 MG tablet; Take 1 tablet by mouth every 8 (eight) hours as needed for severe pain.  Dispense: 90 tablet; Refill: 0 -  gabapentin (NEURONTIN) 300 MG capsule; Take 1 capsule (300 mg total) by mouth 3 (three) times daily.  Dispense: 90 capsule; Refill: 1  8. Obesity (BMI 30-39.9) Discussed diet and exercise for person with BMI >25 Will recheck weight in 3-6 months    Labs pending Health Maintenance reviewed Diet and exercise encouraged  Follow up plan: 3 months   Whitney Hassell Done, FNP

## 2021-03-10 MED ORDER — ROSUVASTATIN CALCIUM 10 MG PO TABS: 10.0000 mg | ORAL_TABLET | Freq: Every day | ORAL | 1 refills | Status: DC

## 2021-03-10 NOTE — Addendum Note (Signed)
Addended by: Chevis Pretty on: 03/10/2021 04:40 PM   Modules accepted: Orders

## 2021-03-15 LAB — TOXASSURE SELECT 13 (MW), URINE

## 2021-04-12 ENCOUNTER — Other Ambulatory Visit: Payer: Self-pay | Admitting: Nurse Practitioner

## 2021-04-12 DIAGNOSIS — Z1231 Encounter for screening mammogram for malignant neoplasm of breast: Secondary | ICD-10-CM

## 2021-04-13 ENCOUNTER — Other Ambulatory Visit: Payer: Self-pay | Admitting: Nurse Practitioner

## 2021-04-13 DIAGNOSIS — G8929 Other chronic pain: Secondary | ICD-10-CM

## 2021-04-13 DIAGNOSIS — M5440 Lumbago with sciatica, unspecified side: Secondary | ICD-10-CM

## 2021-04-13 DIAGNOSIS — I10 Essential (primary) hypertension: Secondary | ICD-10-CM

## 2021-04-14 ENCOUNTER — Other Ambulatory Visit: Payer: Self-pay | Admitting: Nurse Practitioner

## 2021-04-21 DIAGNOSIS — N135 Crossing vessel and stricture of ureter without hydronephrosis: Secondary | ICD-10-CM | POA: Diagnosis not present

## 2021-04-21 DIAGNOSIS — N3020 Other chronic cystitis without hematuria: Secondary | ICD-10-CM | POA: Diagnosis not present

## 2021-04-22 ENCOUNTER — Telehealth: Payer: Self-pay | Admitting: Nurse Practitioner

## 2021-04-22 NOTE — Telephone Encounter (Signed)
Per call to [facility], pt had gotten RF for #17 last month and therefore this months was for the same qty, it would not change unless the pt talked to the pharmacist, not sure if last month it was changed to get all her meds on track with each other. But MMM had sent the Furosemide in on 12/16/20 for #90 with a refill. Pt has #56 left on file at the pharmacy and she can go pick up the remaining for her 90 d supply. Pt aware.

## 2021-05-05 ENCOUNTER — Other Ambulatory Visit: Payer: Self-pay | Admitting: Nurse Practitioner

## 2021-05-05 DIAGNOSIS — I10 Essential (primary) hypertension: Secondary | ICD-10-CM

## 2021-05-17 ENCOUNTER — Other Ambulatory Visit: Payer: Self-pay | Admitting: Nurse Practitioner

## 2021-05-18 ENCOUNTER — Ambulatory Visit
Admission: RE | Admit: 2021-05-18 | Discharge: 2021-05-18 | Disposition: A | Discharge status: Home / Self Care | Payer: Medicare HMO | Source: Ambulatory Visit | Attending: Nurse Practitioner | Admitting: Nurse Practitioner

## 2021-05-18 ENCOUNTER — Other Ambulatory Visit: Payer: Self-pay

## 2021-05-18 DIAGNOSIS — Z1231 Encounter for screening mammogram for malignant neoplasm of breast: Secondary | ICD-10-CM

## 2021-06-01 ENCOUNTER — Ambulatory Visit (INDEPENDENT_AMBULATORY_CARE_PROVIDER_SITE_OTHER): Payer: Medicare HMO | Admitting: Nurse Practitioner

## 2021-06-01 ENCOUNTER — Encounter: Payer: Self-pay | Admitting: Nurse Practitioner

## 2021-06-01 VITALS — BP 136/79 | HR 61 | Temp 98.2°F | Resp 20 | Ht 60.0 in | Wt 189.0 lb

## 2021-06-01 DIAGNOSIS — I10 Essential (primary) hypertension: Secondary | ICD-10-CM

## 2021-06-01 DIAGNOSIS — E669 Obesity, unspecified: Secondary | ICD-10-CM | POA: Diagnosis not present

## 2021-06-01 DIAGNOSIS — E781 Pure hyperglyceridemia: Secondary | ICD-10-CM

## 2021-06-01 DIAGNOSIS — F3341 Major depressive disorder, recurrent, in partial remission: Secondary | ICD-10-CM

## 2021-06-01 DIAGNOSIS — M546 Pain in thoracic spine: Secondary | ICD-10-CM | POA: Diagnosis not present

## 2021-06-01 DIAGNOSIS — G8929 Other chronic pain: Secondary | ICD-10-CM

## 2021-06-01 DIAGNOSIS — R609 Edema, unspecified: Secondary | ICD-10-CM | POA: Diagnosis not present

## 2021-06-01 DIAGNOSIS — G629 Polyneuropathy, unspecified: Secondary | ICD-10-CM

## 2021-06-01 DIAGNOSIS — R69 Illness, unspecified: Secondary | ICD-10-CM | POA: Diagnosis not present

## 2021-06-01 MED ORDER — OXYCODONE-ACETAMINOPHEN 7.5-325 MG PO TABS: 1.0000 | ORAL_TABLET | Freq: Three times a day (TID) | ORAL | 0 refills | Status: DC | PRN

## 2021-06-01 MED ORDER — OXYCODONE-ACETAMINOPHEN 7.5-325 MG PO TABS: 1.0000 | ORAL_TABLET | Freq: Three times a day (TID) | ORAL | 0 refills | Status: AC | PRN

## 2021-06-01 MED ORDER — LISINOPRIL 20 MG PO TABS: 20.0000 mg | ORAL_TABLET | Freq: Every day | ORAL | 1 refills | Status: DC

## 2021-06-01 MED ORDER — FUROSEMIDE 20 MG PO TABS: 20.0000 mg | ORAL_TABLET | Freq: Every day | ORAL | 1 refills | Status: DC

## 2021-06-01 MED ORDER — AMITRIPTYLINE HCL 25 MG PO TABS: 25.0000 mg | ORAL_TABLET | Freq: Every day | ORAL | 1 refills | Status: DC

## 2021-06-01 NOTE — Progress Notes (Signed)
Subjective:    Patient ID: [name], female    DOB: 02-Feb-1946, 76 y.o.   MRN: [id number]  Chief Complaint: medical management of chronic issues     HPI:  [name] is a 76 y.o. who identifies as a female who was assigned female at birth.   Social history: Lives with: lives with her son and daughter in law Work history: retired- helps take care of her great grand child   Comes in today for follow up of the following chronic medical issues:  1. Primary hypertension No c/o chest pain, sob or headache. Does not check blood pressure at home. BP Readings from Last 3 Encounters:  03/09/21 118/62  02/25/21 140/60  12/07/20 118/78      2. Peripheral polyneuropathy Has burning and sensation of bil feet. Denies any lesions or sores on her feet  3. Pure hypertriglyceridemia Does try to watch diet but does no daily exercise.refuses a statin. Lab Results  Component Value Date   CHOL 203 (H) 03/09/2021   HDL 55 03/09/2021   LDLCALC 121 (H) 03/09/2021   TRIG 151 (H) 03/09/2021   CHOLHDL 3.7 03/09/2021     4. Peripheral edema Has daily edema of lower ext. Denies SOB  5. Recurrent major depressive disorder, in partial remission ([place]) Is on elavil which helps her some, but she mainly uses it to help her sleep. Depression screen [facility] 2/9 06/01/2021 03/09/2021 12/07/2020  Decreased Interest 0 0 0  Down, Depressed, Hopeless 0 0 0  PHQ - 2 Score 0 0 0  Altered sleeping 0 0 -  Tired, decreased energy 1 1 -  Change in appetite 0 0 -  Feeling bad or failure about yourself  0 0 -  Trouble concentrating 0 0 -  Moving slowly or fidgety/restless 0 0 -  Suicidal thoughts 0 0 -  PHQ-9 Score 1 1 -  Difficult doing work/chores Not difficult at all Not difficult at all -  Some recent data might be hidden     6. Chronic thoracic back pain Pain assessment: Cause of pain- DDD Pain location- mid back Pain on scale of 1-10- 4/10 currently Frequency- daily What increases  pain-to much activity or standing in one spot What makes pain Better-rest helps Effects on ADL - none Any change in general medical condition-none  Current opioids rx- percocet 7.5/325 TID # meds rx- 90 Effectiveness of current meds-helps Adverse reactions from pain meds-none Morphine equivalent- 33.75 MME  Pill count performed-No Last drug screen - 03/18/21 ( high risk q55m moderate risk q620mlow risk yearly ) Urine drug screen today- No [place]- yes  If yes were their any concerning findings? - no   Overdose risk: 1 Opioid Risk  09/15/2020  Alcohol 0  Illegal Drugs 0  Rx Drugs 0  Alcohol 0  Illegal Drugs 0  Rx Drugs 0  Age between 16-45 years  0  History of Preadolescent Sexual Abuse 0  Psychological Disease 0  Depression 0  Opioid Risk Tool Scoring 0  Opioid Risk Interpretation Low Risk     Pain contract signed on: 03/10/21   7. Obesity (BMI 30-39.9) Weight is up 5lbs Wt Readings from Last 3 Encounters:  06/01/21 189 lb (85.7 kg)  03/09/21 184 lb (83.5 kg)  02/25/21 180 lb (81.6 kg)   BMI Readings from Last 3 Encounters:  06/01/21 36.91 kg/m  03/09/21 35.94 kg/m  02/25/21 35.15 kg/m      New complaints: None today  Allergies  Allergen Reactions   Cashew Nut Oil Hives   Dog Epithelium Hives and Itching   Sulfa Antibiotics     "blistering lips and mouth sores"   Outpatient Encounter Medications as of 06/01/2021  Medication Sig   amitriptyline (ELAVIL) 25 MG tablet Take 1 tablet (25 mg total) by mouth at bedtime.   furosemide (LASIX) 20 MG tablet Take 1 tablet (20 mg total) by mouth daily.   gabapentin (NEURONTIN) 300 MG capsule Take 1 capsule (300 mg total) by mouth 3 (three) times daily.   lisinopril (ZESTRIL) 20 MG tablet Take 1 tablet by mouth once daily   meloxicam (MOBIC) 7.5 MG tablet Take 1 tablet by mouth once daily   methocarbamol (ROBAXIN) 500 MG tablet TAKE 1 TABLET BY MOUTH AT BEDTIME   Multiple Vitamins-Minerals  (PRESERVISION AREDS) CAPS Take 1 capsule by mouth in the morning and at bedtime.   nystatin cream (MYCOSTATIN) Apply topically 2 (two) times daily.   oxyCODONE-acetaminophen (PERCOCET) 7.5-325 MG tablet Take 1 tablet by mouth every 8 (eight) hours as needed for severe pain.   rosuvastatin (CRESTOR) 10 MG tablet Take 1 tablet (10 mg total) by mouth daily.   No facility-administered encounter medications on file as of 06/01/2021.    Past Surgical History:  Procedure Laterality Date   ABDOMINAL HYSTERECTOMY  11/02/10  dr Alycia Rossetti  _0    TAH, BSO, incisional hernia repair , lysis of adhesions for endometrial cancer   ANTERIOR LUMBAR FUSION  01-28-2008   dr elsner  _1    L2 -- L4   BACK SURGERY     BUNIONECTOMY Right 2003   CATARACT EXTRACTION W/ INTRAOCULAR LENS  IMPLANT, BILATERAL  2007   COLONOSCOPY  last one 12-24-2018   CYSTO/  LEFT RETROGRADE PYELOGRAM/ URETEROSCOPY STENT PLACEMENT/  OPEN URETEROLYSIS WITH OMENTAL FLAP Left 03-11-2009    dr borden  _2    CYSTO/  [facility]/  ANTERIOR REPAIR  12-06-2001    dr Jeffie Pollock _3    CYSTOSCOPY W/ URETERAL STENT PLACEMENT Left 12/07/2017   Procedure: CYSTOSCOPY WITH RETROGRADE PYELOGRAM/URETERAL STENT PLACEMENT;  Surgeon: Raynelle Bring, MD;  Location: [facility];  Service: Urology;  Laterality: Left;   CYSTOSCOPY W/ URETERAL STENT PLACEMENT Left 03/29/2018   Procedure: CYSTOSCOPY WITH STENT  EXCHANGE;  Surgeon: Raynelle Bring, MD;  Location: [facility];  Service: Urology;  Laterality: Left;   CYSTOSCOPY WITH RETROGRADE PYELOGRAM, URETEROSCOPY AND STENT PLACEMENT Left 12-29-2008   dr Alinda Money  _4    WITH BALLOON DILATION LEFT URETERAL STRICTURE   CYSTOSCOPY WITH RETROGRADE PYELOGRAM, URETEROSCOPY AND STENT PLACEMENT Left 09-12-2008    dr Jeffie Pollock _5    CYSTOSCOPY WITH RETROGRADE PYELOGRAM, URETEROSCOPY AND STENT PLACEMENT Left 03/23/2020   Procedure: CYSTOSCOPY WITH LEFT RETROGRADE PYELOGRAM, AND LEFT STENT REMOVAL;  Surgeon: Raynelle Bring, MD;  Location: [facility];  Service: Urology;  Laterality: Left;   CYSTOSCOPY WITH STENT PLACEMENT Left 07/26/2018   Procedure: CYSTOSCOPY WITH STENT CHANGE;  Surgeon: Raynelle Bring, MD;  Location: [facility];  Service: Urology;  Laterality: Left;   CYSTOSCOPY WITH STENT PLACEMENT Left 12/03/2018   Procedure: CYSTOSCOPY WITH STENT CHANGE;  Surgeon: Raynelle Bring, MD;  Location: [facility];  Service: Urology;  Laterality: Left;   CYSTOSCOPY WITH STENT PLACEMENT Left 04/22/2019   Procedure: CYSTOSCOPY WITH STENT CHANGE;  Surgeon: Raynelle Bring, MD;  Location[facility];  Service: Urology;  Laterality: Left;   CYSTOSCOPY WITH STENT PLACEMENT Left 10/03/2019   Procedure: CYSTOSCOPY WITH STENT CHANGE;  Surgeon: Raynelle Bring, MD;  Location: [facility];  Service: Urology;  Laterality: Left;   EYE SURGERY     FOOT SURGERY Left 06-23-2010   dr Beola Cord   left first and second toes   INSERTION OF MESH N/A 04/23/2013   Procedure: INSERTION OF MESH;  Surgeon: Adin Hector, MD;  Location: [facility];  Service: General;  Laterality: N/A;   JOINT REPLACEMENT     KNEE ARTHROSCOPY Right 04-27-2007  dr Shellia Carwin _0    LAPAROSCOPIC LYSIS OF ADHESIONS N/A 04/23/2013   Procedure: LAPAROSCOPIC LYSIS OF ADHESIONS;  Surgeon: Adin Hector, MD;  Location: [facility];  Service: General;  Laterality: N/A;   [place] and [place]  12/17/2012   L3 -- L5 laminectomy and L3 -- 5 fusion   TOTAL HIP ARTHROPLASTY Left 07/04/2014   Procedure: LEFT TOTAL HIP ARTHROPLASTY ANTERIOR APPROACH;  Surgeon: Gearlean Alf, MD;  Location: [place];  Service: Orthopedics;  Laterality: Left;   TOTAL HIP ARTHROPLASTY Right 2009   TOTAL KNEE ARTHROPLASTY Right 09-15-2009   dr Shellia Carwin _1    VENTRAL HERNIA REPAIR N/A 07/19/2012   Procedure: LAPAROSCOPIC LYSIS OF ADHESIONS, SMALL BOWEL RESECTION, SEROSAL REPAIR, PRIMARY VENTRAL HERNIA REPAIR;  Surgeon: Adin Hector, MD;  Location: [facility];  Service: General;   Laterality: N/A;   VENTRAL HERNIA REPAIR N/A 04/23/2013   Procedure: LAPAROSCOPIC exploration and repair of hernia in abdominal VENTRAL wall  HERNIA;  Surgeon: Adin Hector, MD;  Location: [facility];  Service: General;  Laterality: N/A;    Family History  Problem Relation Age of Onset   Diabetes Mother    Lung cancer Mother    Diabetes Father    Liver cancer Father    Colon cancer Sister        close to 89 per pt   Diabetes Sister    Diabetes Sister    Hypertension Sister    Kidney disease Sister    Seizures Daughter    Diabetes Son    Breast cancer Neg Hx    Allergic rhinitis Neg Hx    Angioedema Neg Hx    Asthma Neg Hx    Atopy Neg Hx    Eczema Neg Hx    Immunodeficiency Neg Hx    Urticaria Neg Hx    Colon polyps Neg Hx    Esophageal cancer Neg Hx    Rectal cancer Neg Hx    Stomach cancer Neg Hx       Controlled substance contract: 03/10/21     Review of Systems  Constitutional:  Negative for diaphoresis.  Eyes:  Negative for pain.  Respiratory:  Negative for shortness of breath.   Cardiovascular:  Negative for chest pain, palpitations and leg swelling.  Gastrointestinal:  Negative for abdominal pain.  Endocrine: Negative for polydipsia.  Skin:  Negative for rash.  Neurological:  Negative for dizziness, weakness and headaches.  Hematological:  Does not bruise/bleed easily.  All other systems reviewed and are negative.     Objective:   Physical Exam Vitals and nursing note reviewed.  Constitutional:      General: She is not in acute distress.    Appearance: Normal appearance. She is well-developed.  HENT:     Head: Normocephalic.     Right Ear: Tympanic membrane normal.     Left Ear: Tympanic membrane normal.     Nose: Nose normal.     Mouth/Throat:     Mouth: Mucous membranes are moist.  Eyes:  Pupils: Pupils are equal, round, and reactive to light.  Neck:     Vascular: No carotid bruit or JVD.  Cardiovascular:     Rate and Rhythm: Normal  rate and regular rhythm.     Heart sounds: Normal heart sounds.  Pulmonary:     Effort: Pulmonary effort is normal. No respiratory distress.     Breath sounds: Normal breath sounds. No wheezing or rales.  Chest:     Chest wall: No tenderness.  Abdominal:     General: Bowel sounds are normal. There is no distension or abdominal bruit.     Palpations: Abdomen is soft. There is no hepatomegaly, splenomegaly, mass or pulsatile mass.     Tenderness: There is no abdominal tenderness.  Musculoskeletal:        General: Normal range of motion.     Cervical back: Normal range of motion and neck supple.     Right lower leg: No edema.     Left lower leg: No edema.  Lymphadenopathy:     Cervical: No cervical adenopathy.  Skin:    General: Skin is warm and dry.  Neurological:     Mental Status: She is alert and oriented to person, place, and time.     Deep Tendon Reflexes: Reflexes are normal and symmetric.  Psychiatric:        Behavior: Behavior normal.        Thought Content: Thought content normal.        Judgment: Judgment normal.    BP 136/79    Pulse 61    Temp 98.2 F (36.8 C) (Temporal)    Resp 20    Ht 5' (1.524 m)    Wt 189 lb (85.7 kg)    SpO2 95%    BMI 36.91 kg/m        Assessment & Plan:   [name] comes in today with chief complaint of Medical Management of Chronic Issues   Diagnosis and orders addressed:  1. Primary hypertension Low sodium diet - lisinopril (ZESTRIL) 20 MG tablet; Take 1 tablet (20 mg total) by mouth daily.  Dispense: 90 tablet; Refill: 1 - CBC with Differential/Platelet - CMP14+EGFR  2. Peripheral polyneuropathy Do not go barefooted Check feet daily  3. Pure hypertriglyceridemia Low fat diet Refuses statin therapy - Lipid panel  4. Peripheral edema Elevate legs whens sitting - furosemide (LASIX) 20 MG tablet; Take 1 tablet (20 mg total) by mouth daily.  Dispense: 90 tablet; Refill: 1  5. Recurrent major depressive disorder, in  partial remission ([place]) Stress management  6. Obesity (BMI 30-39.9) Discussed diet and exercise for person with BMI >25 Will recheck weight in 3-6 months  7. Chronic midline thoracic back pain  - oxyCODONE-acetaminophen (PERCOCET) 7.5-325 MG tablet; Take 1 tablet by mouth every 8 (eight) hours as needed for severe pain.  Dispense: 90 tablet; Refill: 0 - oxyCODONE-acetaminophen (PERCOCET) 7.5-325 MG tablet; Take 1 tablet by mouth every 8 (eight) hours as needed for severe pain.  Dispense: 90 tablet; Refill: 0 - oxyCODONE-acetaminophen (PERCOCET) 7.5-325 MG tablet; Take 1 tablet by mouth every 8 (eight) hours as needed for severe pain.  Dispense: 90 tablet; Refill: 0 - amitriptyline (ELAVIL) 25 MG tablet; Take 1 tablet (25 mg total) by mouth at bedtime.  Dispense: 90 tablet; Refill: 1   Labs pending Health Maintenance reviewed Diet and exercise encouraged  Follow up plan: 3 months   Mary-Margaret Hassell Done, FNP

## 2021-06-02 LAB — CBC WITH DIFFERENTIAL/PLATELET
Basophils Absolute: 0 10*3/uL (ref 0.0–0.2)
Basos: 1 %
EOS (ABSOLUTE): 0.2 10*3/uL (ref 0.0–0.4)
Eos: 2 %
Hematocrit: 40.7 % (ref 34.0–46.6)
Hemoglobin: 13.9 g/dL (ref 11.1–15.9)
Immature Grans (Abs): 0 10*3/uL (ref 0.0–0.1)
Immature Granulocytes: 0 %
Lymphocytes Absolute: 2.3 10*3/uL (ref 0.7–3.1)
Lymphs: 32 %
MCH: 32 pg (ref 26.6–33.0)
MCHC: 34.2 g/dL (ref 31.5–35.7)
MCV: 94 fL (ref 79–97)
Monocytes Absolute: 0.5 10*3/uL (ref 0.1–0.9)
Monocytes: 7 %
Neutrophils Absolute: 4.1 10*3/uL (ref 1.4–7.0)
Neutrophils: 58 %
Platelets: 296 10*3/uL (ref 150–450)
RBC: 4.35 x10E6/uL (ref 3.77–5.28)
RDW: 12.4 % (ref 11.7–15.4)
WBC: 7.1 10*3/uL (ref 3.4–10.8)

## 2021-06-02 LAB — CMP14+EGFR
ALT: 17 IU/L (ref 0–32)
AST: 24 IU/L (ref 0–40)
Albumin/Globulin Ratio: 1.9 (ref 1.2–2.2)
Albumin: 4.6 g/dL (ref 3.7–4.7)
Alkaline Phosphatase: 79 IU/L (ref 44–121)
BUN/Creatinine Ratio: 21 (ref 12–28)
BUN: 20 mg/dL (ref 8–27)
Bilirubin Total: 0.6 mg/dL (ref 0.0–1.2)
CO2: 21 mmol/L (ref 20–29)
Calcium: 9.5 mg/dL (ref 8.7–10.3)
Chloride: 106 mmol/L (ref 96–106)
Creatinine, Ser: 0.96 mg/dL (ref 0.57–1.00)
Globulin, Total: 2.4 g/dL (ref 1.5–4.5)
Glucose: 102 mg/dL — ABNORMAL HIGH (ref 70–99)
Potassium: 5.1 mmol/L (ref 3.5–5.2)
Sodium: 143 mmol/L (ref 134–144)
Total Protein: 7 g/dL (ref 6.0–8.5)
eGFR: 61 mL/min/{1.73_m2} (ref >59)

## 2021-06-02 LAB — LIPID PANEL
Chol/HDL Ratio: 3.3 ratio (ref 0.0–4.4)
Cholesterol, Total: 204 mg/dL — ABNORMAL HIGH (ref 100–199)
HDL: 62 mg/dL (ref >39)
LDL Chol Calc (NIH): 118 mg/dL — ABNORMAL HIGH (ref 0–99)
Triglycerides: 137 mg/dL (ref 0–149)
VLDL Cholesterol Cal: 24 mg/dL (ref 5–40)

## 2021-06-02 MED ORDER — ATORVASTATIN CALCIUM 40 MG PO TABS: 40.0000 mg | ORAL_TABLET | Freq: Every day | ORAL | 1 refills | Status: DC

## 2021-06-02 NOTE — Addendum Note (Signed)
Addended by: Chevis Pretty on: 06/02/2021 09:24 AM   Modules accepted: Orders

## 2021-06-03 DIAGNOSIS — Z0100 Encounter for examination of eyes and vision without abnormal findings: Secondary | ICD-10-CM | POA: Diagnosis not present

## 2021-06-21 ENCOUNTER — Ambulatory Visit (INDEPENDENT_AMBULATORY_CARE_PROVIDER_SITE_OTHER): Payer: Medicare HMO

## 2021-06-21 VITALS — Wt 189.0 lb

## 2021-06-21 DIAGNOSIS — Z0000 Encounter for general adult medical examination without abnormal findings: Secondary | ICD-10-CM

## 2021-06-21 NOTE — Progress Notes (Addendum)
Subjective:   [name] is a 76 y.o. female who presents for Medicare Annual (Subsequent) preventive examination.  Virtual Visit via Telephone Note  I connected with  [name] on 06/21/21 at  9:00 AM EST by telephone and verified that I am speaking with the correct person using two identifiers.  Location: Patient: Home Provider: [facility] Persons participating in the virtual visit: patient/Nurse Health Advisor   I discussed the limitations, risks, security and privacy concerns of performing an evaluation and management service by telephone and the availability of in person appointments. The patient expressed understanding and agreed to proceed.  Interactive audio and video telecommunications were attempted between this nurse and patient, however failed, due to patient having technical difficulties OR patient did not have access to video capability.  We continued and completed visit with audio only.  Some vital signs may be absent or patient reported.   Raziya Aveni E Jerie Basford, LPN   Review of Systems     Cardiac Risk Factors include: advanced age (>42men, >39 women);sedentary lifestyle;obesity (BMI >30kg/m2);dyslipidemia;hypertension;Other (see comment), Risk factor comments: atherosclerosis     Objective:    Today's Vitals   06/21/21 0854 06/21/21 0855  Weight: 189 lb (85.7 kg)   PainSc:  4    Body mass index is 36.91 kg/m.  Advanced Directives 06/21/2021 06/18/2020 03/19/2020 09/25/2019 08/23/2019 08/23/2019 08/15/2019  Does Patient Have a Medical Advance Directive? Yes No Yes Yes Yes Yes Yes  Type of Paramedic of [place];Living will - Living will [place];Living will Living will;Healthcare Power of Attorney Living will;Healthcare Power of [place];Living will  Does patient want to make changes to medical advance directive? - - - - No - Patient declined - -  Copy of [place] in  Chart? No - copy requested - - - No - copy requested No - copy requested -  Would patient like information on creating a medical advance directive? - No - Patient declined - - - - -  Pre-existing out of facility DNR order (yellow form or pink MOST form) - - - - - - -    Current Medications (verified) Outpatient Encounter Medications as of 06/21/2021  Medication Sig   amitriptyline (ELAVIL) 25 MG tablet Take 1 tablet (25 mg total) by mouth at bedtime.   atorvastatin (LIPITOR) 40 MG tablet Take 1 tablet (40 mg total) by mouth daily.   furosemide (LASIX) 20 MG tablet Take 1 tablet (20 mg total) by mouth daily.   gabapentin (NEURONTIN) 300 MG capsule Take 1 capsule (300 mg total) by mouth 3 (three) times daily.   lisinopril (ZESTRIL) 20 MG tablet Take 1 tablet (20 mg total) by mouth daily.   meloxicam (MOBIC) 7.5 MG tablet Take 1 tablet by mouth once daily   methocarbamol (ROBAXIN) 500 MG tablet TAKE 1 TABLET BY MOUTH AT BEDTIME   Multiple Vitamins-Minerals (PRESERVISION AREDS) CAPS Take 1 capsule by mouth in the morning and at bedtime.   nystatin cream (MYCOSTATIN) Apply topically 2 (two) times daily.   oxyCODONE-acetaminophen (PERCOCET) 7.5-325 MG tablet Take 1 tablet by mouth every 8 (eight) hours as needed for severe pain.   [START ON 08/06/2021] oxyCODONE-acetaminophen (PERCOCET) 7.5-325 MG tablet Take 1 tablet by mouth every 8 (eight) hours as needed for severe pain.   [START ON 07/07/2021] oxyCODONE-acetaminophen (PERCOCET) 7.5-325 MG tablet Take 1 tablet by mouth every 8 (eight) hours as needed for severe pain.   No facility-administered encounter  medications on file as of 06/21/2021.    Allergies (verified) Cashew nut oil, Dog epithelium, and Sulfa antibiotics   History: Past Medical History:  Diagnosis Date   Arthritis    Cancer ([place])    ovarian,skin Ca. in mouth   Cataract    Bil cataracts removed   Chronic constipation    Chronic kidney disease    Chronic midline low back pain  with sciatica    Colon polyp    large ascending colon polyp , scheduled for resection 02/ 2021   Full dentures    History of endometrial cancer 2012   s/p   TAH w/ BSO 11-02-2010   History of recurrent UTIs    History of sepsis    multiple times (some due to e.coli) last sepsis 07/ 2019   History of small bowel obstruction    x2  ,  hx bowel resection   HOH (hard of hearing)    Hyperlipidemia    Hypertension    Retroperitoneal fibrosis    Substance abuse ([place])    opiod dependancy   Ureteral obstruction, left urologist-- dr Alinda Money   secondary to adhesions/ retroperitoneal fibrosis, treated with ureteral stent   Vertigo    Wears glasses    Past Surgical History:  Procedure Laterality Date   ABDOMINAL HYSTERECTOMY  11/02/10  dr Alycia Rossetti  @[facility]    TAH, BSO, incisional hernia repair , lysis of adhesions for endometrial cancer   ANTERIOR LUMBAR FUSION  01-28-2008   dr elsner  @[facility]    L2 -- L4   BACK SURGERY     BUNIONECTOMY Right 2003   CATARACT EXTRACTION W/ INTRAOCULAR LENS  IMPLANT, BILATERAL  2007   COLONOSCOPY  last one 12-24-2018   CYSTO/  LEFT RETROGRADE PYELOGRAM/ URETEROSCOPY STENT PLACEMENT/  OPEN URETEROLYSIS WITH OMENTAL FLAP Left 03-11-2009    dr borden  @[facility]    CYSTO/  [facility]/  ANTERIOR REPAIR  12-06-2001    dr Jeffie Pollock @[facility]    CYSTOSCOPY W/ URETERAL STENT PLACEMENT Left 12/07/2017   Procedure: CYSTOSCOPY WITH RETROGRADE PYELOGRAM/URETERAL STENT PLACEMENT;  Surgeon: Raynelle Bring, MD;  Location: [facility];  Service: Urology;  Laterality: Left;   CYSTOSCOPY W/ URETERAL STENT PLACEMENT Left 03/29/2018   Procedure: CYSTOSCOPY WITH STENT  EXCHANGE;  Surgeon: Raynelle Bring, MD;  [facility]: [facility];  Service: Urology;  Laterality: Left;   CYSTOSCOPY WITH RETROGRADE PYELOGRAM, URETEROSCOPY AND STENT PLACEMENT Left 12-29-2008   dr Alinda Money  @[facility]    WITH BALLOON DILATION LEFT URETERAL STRICTURE   CYSTOSCOPY WITH RETROGRADE PYELOGRAM, URETEROSCOPY AND STENT PLACEMENT Left 09-12-2008    dr  Jeffie Pollock @[facility]    CYSTOSCOPY WITH RETROGRADE PYELOGRAM, URETEROSCOPY AND STENT PLACEMENT Left 03/23/2020   Procedure: CYSTOSCOPY WITH LEFT RETROGRADE PYELOGRAM, AND LEFT STENT REMOVAL;  Surgeon: Raynelle Bring, MD;  [facility];  Service: Urology;  Laterality: Left;   CYSTOSCOPY WITH STENT PLACEMENT Left 07/26/2018   Procedure: CYSTOSCOPY WITH STENT CHANGE;  Surgeon: Raynelle Bring, MD;  [facility];  Service: Urology;  Laterality: Left;   CYSTOSCOPY WITH STENT PLACEMENT Left 12/03/2018   Procedure: CYSTOSCOPY WITH STENT CHANGE;  Surgeon: Raynelle Bring, MD;  [facility];  Service: Urology;  Laterality: Left;   CYSTOSCOPY WITH STENT PLACEMENT Left 04/22/2019   Procedure: CYSTOSCOPY WITH STENT CHANGE;  Surgeon: Raynelle Bring, MD;  Location: [facility];  Service: Urology;  Laterality: Left;   CYSTOSCOPY WITH STENT PLACEMENT Left 10/03/2019   Procedure: CYSTOSCOPY WITH STENT CHANGE;  Surgeon: Raynelle Bring, MD;  Location: [facility];  Service: Urology;  Laterality: Left;   EYE SURGERY     FOOT SURGERY Left 06-23-2010   dr Beola Cord   left first and second toes   INSERTION OF MESH N/A 04/23/2013   Procedure: INSERTION OF MESH;  Surgeon: Adin Hector, MD;  Location[facility];  Service: General;  Laterality: N/A;   JOINT REPLACEMENT     KNEE ARTHROSCOPY Right 04-27-2007  dr Shellia Carwin @[facility]    LAPAROSCOPIC LYSIS OF ADHESIONS N/A 04/23/2013   Procedure: LAPAROSCOPIC LYSIS OF ADHESIONS;  Surgeon: Adin Hector, MD;  [facility];  Service: General;  Laterality: N/A;   [place]  12/17/2012   L3 -- L5 laminectomy and L3 -- 5 fusion   TOTAL HIP ARTHROPLASTY Left 07/04/2014   Procedure: LEFT TOTAL HIP ARTHROPLASTY ANTERIOR APPROACH;  Surgeon: Gearlean Alf, MD;  Location: [facility];  Service: Orthopedics;  Laterality: Left;   TOTAL HIP ARTHROPLASTY Right 2009   TOTAL KNEE ARTHROPLASTY Right 09-15-2009   dr Shellia Carwin @[facility]     VENTRAL HERNIA REPAIR N/A 07/19/2012   Procedure: LAPAROSCOPIC LYSIS OF ADHESIONS, SMALL BOWEL RESECTION, SEROSAL REPAIR, PRIMARY VENTRAL HERNIA REPAIR;  Surgeon: Adin Hector, MD;  [facility];  Service: General;  Laterality: N/A;   VENTRAL HERNIA REPAIR N/A 04/23/2013   Procedure: LAPAROSCOPIC exploration and repair of hernia in abdominal VENTRAL wall  HERNIA;  Surgeon: Adin Hector, MD;  [facility];  Service: General;  Laterality: N/A;   Family History  Problem Relation Age of Onset   Diabetes Mother    Lung cancer Mother    Diabetes Father    Liver cancer Father    Colon cancer Sister        close to 29 per pt   Diabetes Sister    Diabetes Sister    Hypertension Sister    Kidney disease Sister    Seizures Daughter    Diabetes Son    Breast cancer Neg Hx    Allergic rhinitis Neg Hx    Angioedema Neg Hx    Asthma Neg Hx    Atopy Neg Hx    Eczema Neg Hx    Immunodeficiency Neg Hx    Urticaria Neg Hx    Colon polyps Neg Hx    Esophageal cancer Neg Hx    Rectal cancer Neg Hx    Stomach cancer Neg Hx    Social History   Socioeconomic History   Marital status: Single    Spouse name: Not on file   Number of children: 3   Years of education: 12   Highest education level: High school graduate  Occupational History   Occupation: Retired  Tobacco Use   Smoking status: Never   Smokeless tobacco: Never  Vaping Use   Vaping Use: Never used  Substance and Sexual Activity   Alcohol use: No   Drug use: No   Sexual activity: Not Currently    Birth control/protection: Surgical  Other Topics Concern   Not on file  Social History Narrative   Her son and DIL live with her   They help with bills   Social Determinants of Health   Financial Resource Strain: Low Risk    Difficulty of Paying Living Expenses: Not hard at all  Food Insecurity: No Food Insecurity   Worried About Charity fundraiser in the Last Year: Never true   [place] in the Last Year:  Never true  Transportation Needs: No Transportation Needs   Lack of Transportation (Medical): No   Lack of Transportation (Non-Medical): No  Physical Activity: Insufficiently Active   Days of Exercise per Week: 7 days   Minutes of Exercise per Session: 20 min  Stress: No Stress Concern Present   Feeling of Stress : Not at all  Social Connections: Moderately Integrated   Frequency of Communication with Friends and Family: More than three times a week   Frequency of Social Gatherings with Friends and Family: More than three times a week   Attends Religious Services: More than 4 times per year   Active Member of [employer] or Organizations: Yes   Attends Archivist Meetings: More than 4 times per year   Marital Status: Widowed    Tobacco Counseling Counseling given: Not Answered   Clinical Intake:  Pre-visit preparation completed: Yes  Pain : 0-10 Pain Score: 4  Pain Type: Chronic pain Pain Location: Back Pain Orientation: Lower Pain Descriptors / Indicators: Aching, Discomfort Pain Onset: More than a month ago Pain Frequency: Intermittent     BMI - recorded: 36.91 Nutritional Status: BMI > 30  Obese Nutritional Risks: None Diabetes: No  How often do you need to have someone help you when you read instructions, pamphlets, or other written materials from your doctor or pharmacy?: 1 - Never  Diabetic? no  Interpreter Needed?: No  Information entered by :: Catharine Kettlewell, LPN   Activities of Daily Living In your present state of health, do you have any difficulty performing the following activities: 06/21/2021  Hearing? Y  Comment wears hearing aids  Vision? N  Difficulty concentrating or making decisions? Y  Walking or climbing stairs? N  Dressing or bathing? N  Doing errands, shopping? N  Preparing Food and eating ? N  Using the Toilet? N  In the past six months, have you accidently leaked urine? N  Do you have problems with loss of bowel control? N   Managing your Medications? N  Managing your Finances? N  Housekeeping or managing your Housekeeping? N  Some recent data might be hidden    Patient Care Team: Chevis Pretty, FNP as PCP - General (Family Medicine) Nancy Marus, MD as Consulting Physician (Gynecologic Oncology) Raynelle Bring, MD as Consulting Physician (Urology) Marlaine Hind, MD as Consulting Physician (Pain Medicine) Loletha Carrow Kirke Corin, MD as Consulting Physician (Gastroenterology) Michael Boston, MD as Consulting Physician (General Surgery)  Indicate any recent Medical Services you may have received from other than [facility] providers in the past year (date may be approximate).     Assessment:   This is a routine wellness examination for [name].  Hearing/Vision screen Hearing Screening - Comments:: Wears hearing aids -from [employer] (got these with government assistance program) Vision Screening - Comments:: Wears rx glasses - up to date with routine eye exams with [facility]  Dietary issues and exercise activities discussed: Current Exercise Habits: Home exercise routine, Type of exercise: walking, Time (Minutes): 15, Frequency (Times/Week): 7, Weekly Exercise (Minutes/Week): 105, Intensity: Mild, Exercise limited by: orthopedic condition(s)   Goals Addressed             This Visit's Progress    Exercise 150 min/wk Moderate Activity   Not on track      Depression Screen PHQ 2/9 Scores 06/21/2021 06/01/2021 03/09/2021 12/07/2020 09/15/2020 06/18/2020 06/16/2020  PHQ - 2 Score 0 0 0 0 0 0 0  PHQ- 9 Score 1 1 1  - 0 - -  Fall Risk Fall Risk  06/21/2021 06/01/2021 03/09/2021 12/07/2020 09/15/2020  Falls in the past year? 0 0 0 0 0  Number falls in past yr: 0 - - - -  Injury with Fall? 0 - - - -  Risk for fall due to : Orthopedic patient;Medication side effect;Impaired balance/gait - - - -  Follow up Education provided;Falls prevention discussed - - - -    FALL RISK PREVENTION PERTAINING TO THE HOME:  Any  stairs in or around the home? Yes  If so, are there any without handrails? No  Home free of loose throw rugs in walkways, pet beds, electrical cords, etc? Yes  Adequate lighting in your home to reduce risk of falls? Yes   ASSISTIVE DEVICES UTILIZED TO PREVENT FALLS:  Life alert? No  Use of a cane, walker or w/c? Yes  Grab bars in the bathroom? Yes  Shower chair or bench in shower? Yes  Elevated toilet seat or a handicapped toilet? Yes   TIMED UP AND GO:  Was the test performed? No . Telephonic visit  Cognitive Function: Normal cognitive status assessed by direct observation by this Nurse Health Advisor. No abnormalities found.   MMSE - Mini Mental State Exam 01/11/2018  Orientation to time 5  Orientation to Place 5  Registration 3  Attention/ Calculation 5  Recall 3  Language- name 2 objects 2  Language- repeat 1  Language- follow 3 step command 3  Language- read & follow direction 1  Write a sentence 1  Copy design 1  Total score 30     6CIT Screen 06/21/2021 06/18/2020  What Year? 0 points 0 points  What month? 0 points 0 points  What time? 0 points 0 points  Count back from 20 0 points 0 points  Months in reverse 2 points 0 points  Repeat phrase 2 points 0 points  Total Score 4 0    Immunizations Immunization History  Administered Date(s) Administered   Fluad Quad(high Dose 65+) 03/22/2019, 03/20/2020, 03/09/2021   Influenza, High Dose Seasonal PF 02/19/2016, 03/20/2017, 03/14/2018   Influenza,inj,Quad PF,6+ Mos 02/21/2013, 03/28/2014, 04/17/2015   Moderna Covid-19 Vaccine Bivalent Booster 14yrs & up 06/01/2021   Moderna Sars-Covid-2 Vaccination 07/15/2019, 08/12/2019, 03/31/2020, 11/17/2020   Pneumococcal Conjugate-13 09/30/2014   Pneumococcal Polysaccharide-23 05/09/2010   Pneumococcal-Unspecified 05/09/2016   Tdap 03/09/2012   Zoster, Live 05/09/2010    TDAP status: Up to date  Flu Vaccine status: Up to date  Pneumococcal vaccine status: Up to  date  Covid-19 vaccine status: Completed vaccines  Qualifies for Shingles Vaccine? Yes   Zostavax completed Yes   Shingrix Completed?: No.    Education has been provided regarding the importance of this vaccine. Patient has been advised to call insurance company to determine out of pocket expense if they have not yet received this vaccine. Advised may also receive vaccine at local pharmacy or Health Dept. Verbalized acceptance and understanding.  Screening Tests Health Maintenance  Topic Date Due   Zoster Vaccines- Shingrix (1 of 2) Never done   TETANUS/TDAP  03/09/2022   COLONOSCOPY (Pts 45-14yrs Insurance coverage will need to be confirmed)  02/26/2024   Pneumonia Vaccine 51+ Years old  Completed   INFLUENZA VACCINE  Completed   DEXA SCAN  Completed   COVID-19 Vaccine  Completed   Hepatitis C Screening  Completed   HPV VACCINES  Aged Out    Health Maintenance  Health Maintenance Due  Topic Date Due   Zoster Vaccines- Shingrix (1 of  2) Never done    Colorectal cancer screening: No longer required.   Mammogram status: Completed 05/18/2021. Repeat every year  Bone Density status: Completed 11/14/2021. Results reflect: Bone density results: NORMAL. Repeat every 5 years.  Lung Cancer Screening: (Low Dose CT Chest recommended if Age 62-80 years, 30 pack-year currently smoking OR have quit w/in 15years.) does not qualify.   Additional Screening:  Hepatitis C Screening: does qualify; Completed 04/17/2015  Vision Screening: Recommended annual ophthalmology exams for early detection of glaucoma and other disorders of the eye. Is the patient up to date with their annual eye exam?  Yes  Who is the provider or what is the name of the office in which the patient attends annual eye exams? [facility] If pt is not established with a provider, would they like to be referred to a provider to establish care? No .   Dental Screening: Recommended annual dental exams for proper oral  hygiene  Community Resource Referral / Chronic Care Management: CRR required this visit?  No   CCM required this visit?  No      Plan:     I have personally reviewed and noted the following in the patients chart:   Medical and social history Use of alcohol, tobacco or illicit drugs  Current medications and supplements including opioid prescriptions.  Functional ability and status Nutritional status Physical activity Advanced directives List of other physicians Hospitalizations, surgeries, and ER visits in previous 12 months Vitals Screenings to include cognitive, depression, and falls Referrals and appointments  In addition, I have reviewed and discussed with patient certain preventive protocols, quality metrics, and best practice recommendations. A written personalized care plan for preventive services as well as general preventive health recommendations were provided to patient.     Sandrea Hammond, LPN   [id number]/0354   Nurse Notes: None  I have reviewed and agree with the above AWV documentation.   Mary-Margaret Hassell Done, FNP

## 2021-06-21 NOTE — Patient Instructions (Signed)
[name] , Thank you for taking time to come for your Medicare Wellness Visit. I appreciate your ongoing commitment to your health goals. Please review the following plan we discussed and let me know if I can assist you in the future.   Screening recommendations/referrals: Colonoscopy: Done 02/25/2021 - no repeat Mammogram: Done 05/18/2021 - Repeat annually Bone Density: Done 11/14/2016 - Repeat in 5 years *due this year Recommended yearly ophthalmology/optometry visit for glaucoma screening and checkup Recommended yearly dental visit for hygiene and checkup  Vaccinations: Influenza vaccine: Done 03/09/2021 - Repeat annually Pneumococcal vaccine: Done 2012, 09/30/2014, 2018 Tdap vaccine: Done 03/09/2012 - Repeat in 10 years *this year Shingles vaccine: Zostavax done 2012 - due for Shingrix - get at next visit   Covid-19:Done 07/15/2019, 08/12/2019, 03/31/2020, 11/17/2020, & 06/01/2021  Advanced directives: Please bring a copy of your health care power of attorney and living will to the office to be added to your chart at your convenience.   Conditions/risks identified: Aim for 30 minutes of exercise or brisk walking each day, drink 6-8 glasses of water and eat lots of fruits and vegetables.   Next appointment: Follow up in one year for your annual wellness visit    Preventive Care 65 Years and Older, Female Preventive care refers to lifestyle choices and visits with your health care provider that can promote health and wellness. What does preventive care include? A yearly physical exam. This is also called an annual well check. Dental exams once or twice a year. Routine eye exams. Ask your health care provider how often you should have your eyes checked. Personal lifestyle choices, including: Daily care of your teeth and gums. Regular physical activity. Eating a healthy diet. Avoiding tobacco and drug use. Limiting alcohol use. Practicing safe sex. Taking low-dose aspirin every  day. Taking vitamin and mineral supplements as recommended by your health care provider. What happens during an annual well check? The services and screenings done by your health care provider during your annual well check will depend on your age, overall health, lifestyle risk factors, and family history of disease. Counseling  Your health care provider may ask you questions about your: Alcohol use. Tobacco use. Drug use. Emotional well-being. Home and relationship well-being. Sexual activity. Eating habits. History of falls. Memory and ability to understand (cognition). Work and work Statistician. Reproductive health. Screening  You may have the following tests or measurements: Height, weight, and BMI. Blood pressure. Lipid and cholesterol levels. These may be checked every 5 years, or more frequently if you are over 75 years old. Skin check. Lung cancer screening. You may have this screening every year starting at age 12 if you have a 30-pack-year history of smoking and currently smoke or have quit within the past 15 years. Fecal occult blood test (FOBT) of the stool. You may have this test every year starting at age 55. Flexible sigmoidoscopy or colonoscopy. You may have a sigmoidoscopy every 5 years or a colonoscopy every 10 years starting at age 38. Hepatitis C blood test. Hepatitis B blood test. Sexually transmitted disease (STD) testing. Diabetes screening. This is done by checking your blood sugar (glucose) after you have not eaten for a while (fasting). You may have this done every 1-3 years. Bone density scan. This is done to screen for osteoporosis. You may have this done starting at age 16. Mammogram. This may be done every 1-2 years. Talk to your health care provider about how often you should have regular mammograms. Talk with  your health care provider about your test results, treatment options, and if necessary, the need for more tests. Vaccines  Your health care  provider may recommend certain vaccines, such as: Influenza vaccine. This is recommended every year. Tetanus, diphtheria, and acellular pertussis (Tdap, Td) vaccine. You may need a Td booster every 10 years. Zoster vaccine. You may need this after age 9. Pneumococcal 13-valent conjugate (PCV13) vaccine. One dose is recommended after age 30. Pneumococcal polysaccharide (PPSV23) vaccine. One dose is recommended after age 58. Talk to your health care provider about which screenings and vaccines you need and how often you need them. This information is not intended to replace advice given to you by your health care provider. Make sure you discuss any questions you have with your health care provider. Document Released: 05/22/2015 Document Revised: 01/13/2016 Document Reviewed: 02/24/2015 Elsevier Interactive Patient Education  2017 [place] Prevention in the Home Falls can cause injuries. They can happen to people of all ages. There are many things you can do to make your home safe and to help prevent falls. What can I do on the outside of my home? Regularly fix the edges of walkways and driveways and fix any cracks. Remove anything that might make you trip as you walk through a door, such as a raised step or threshold. Trim any bushes or trees on the path to your home. Use bright outdoor lighting. Clear any walking paths of anything that might make someone trip, such as rocks or tools. Regularly check to see if handrails are loose or broken. Make sure that both sides of any steps have handrails. Any raised decks and porches should have guardrails on the edges. Have any leaves, snow, or ice cleared regularly. Use sand or salt on walking paths during winter. Clean up any spills in your garage right away. This includes oil or grease spills. What can I do in the bathroom? Use night lights. Install grab bars by the toilet and in the tub and shower. Do not use towel bars as grab  bars. Use non-skid mats or decals in the tub or shower. If you need to sit down in the shower, use a plastic, non-slip stool. Keep the floor dry. Clean up any water that spills on the floor as soon as it happens. Remove soap buildup in the tub or shower regularly. Attach bath mats securely with double-sided non-slip rug tape. Do not have throw rugs and other things on the floor that can make you trip. What can I do in the bedroom? Use night lights. Make sure that you have a light by your bed that is easy to reach. Do not use any sheets or blankets that are too big for your bed. They should not hang down onto the floor. Have a firm chair that has side arms. You can use this for support while you get dressed. Do not have throw rugs and other things on the floor that can make you trip. What can I do in the kitchen? Clean up any spills right away. Avoid walking on wet floors. Keep items that you use a lot in easy-to-reach places. If you need to reach something above you, use a strong step stool that has a grab bar. Keep electrical cords out of the way. Do not use floor polish or wax that makes floors slippery. If you must use wax, use non-skid floor wax. Do not have throw rugs and other things on the floor that can make you trip.  What can I do with my stairs? Do not leave any items on the stairs. Make sure that there are handrails on both sides of the stairs and use them. Fix handrails that are broken or loose. Make sure that handrails are as long as the stairways. Check any carpeting to make sure that it is firmly attached to the stairs. Fix any carpet that is loose or worn. Avoid having throw rugs at the top or bottom of the stairs. If you do have throw rugs, attach them to the floor with carpet tape. Make sure that you have a light switch at the top of the stairs and the bottom of the stairs. If you do not have them, ask someone to add them for you. What else can I do to help prevent  falls? Wear shoes that: Do not have high heels. Have rubber bottoms. Are comfortable and fit you well. Are closed at the toe. Do not wear sandals. If you use a stepladder: Make sure that it is fully opened. Do not climb a closed stepladder. Make sure that both sides of the stepladder are locked into place. Ask someone to hold it for you, if possible. Clearly mark and make sure that you can see: Any grab bars or handrails. First and last steps. Where the edge of each step is. Use tools that help you move around (mobility aids) if they are needed. These include: Canes. Walkers. Scooters. Crutches. Turn on the lights when you go into a dark area. Replace any light bulbs as soon as they burn out. Set up your furniture so you have a clear path. Avoid moving your furniture around. If any of your floors are uneven, fix them. If there are any pets around you, be aware of where they are. Review your medicines with your doctor. Some medicines can make you feel dizzy. This can increase your chance of falling. Ask your doctor what other things that you can do to help prevent falls. This information is not intended to replace advice given to you by your health care provider. Make sure you discuss any questions you have with your health care provider. Document Released: 02/19/2009 Document Revised: 10/01/2015 Document Reviewed: 05/30/2014 Elsevier Interactive Patient Education  2017 [employer].

## 2021-07-05 ENCOUNTER — Other Ambulatory Visit: Payer: Self-pay | Admitting: Nurse Practitioner

## 2021-07-05 DIAGNOSIS — M5440 Lumbago with sciatica, unspecified side: Secondary | ICD-10-CM

## 2021-07-05 DIAGNOSIS — G8929 Other chronic pain: Secondary | ICD-10-CM

## 2021-07-22 ENCOUNTER — Inpatient Hospital Stay (HOSPITAL_COMMUNITY)
Admission: EM | Admit: 2021-07-22 | Discharge: 2021-07-25 | DRG: 872 | Disposition: A | Discharge status: Home / Self Care | Payer: Medicare HMO | Attending: Family Medicine | Admitting: Family Medicine

## 2021-07-22 ENCOUNTER — Encounter (HOSPITAL_COMMUNITY): Payer: Self-pay | Admitting: Emergency Medicine

## 2021-07-22 ENCOUNTER — Other Ambulatory Visit: Payer: Self-pay

## 2021-07-22 ENCOUNTER — Emergency Department (HOSPITAL_COMMUNITY): Payer: Medicare HMO

## 2021-07-22 DIAGNOSIS — R600 Localized edema: Secondary | ICD-10-CM | POA: Diagnosis present

## 2021-07-22 DIAGNOSIS — Z8542 Personal history of malignant neoplasm of other parts of uterus: Secondary | ICD-10-CM

## 2021-07-22 DIAGNOSIS — N1330 Unspecified hydronephrosis: Secondary | ICD-10-CM | POA: Diagnosis not present

## 2021-07-22 DIAGNOSIS — R000 Tachycardia, unspecified: Secondary | ICD-10-CM | POA: Diagnosis present

## 2021-07-22 DIAGNOSIS — I10 Essential (primary) hypertension: Secondary | ICD-10-CM | POA: Diagnosis not present

## 2021-07-22 DIAGNOSIS — Z96643 Presence of artificial hip joint, bilateral: Secondary | ICD-10-CM | POA: Diagnosis present

## 2021-07-22 DIAGNOSIS — Z833 Family history of diabetes mellitus: Secondary | ICD-10-CM

## 2021-07-22 DIAGNOSIS — A419 Sepsis, unspecified organism: Secondary | ICD-10-CM | POA: Diagnosis present

## 2021-07-22 DIAGNOSIS — Z800 Family history of malignant neoplasm of digestive organs: Secondary | ICD-10-CM

## 2021-07-22 DIAGNOSIS — Z79899 Other long term (current) drug therapy: Secondary | ICD-10-CM

## 2021-07-22 DIAGNOSIS — Z801 Family history of malignant neoplasm of trachea, bronchus and lung: Secondary | ICD-10-CM

## 2021-07-22 DIAGNOSIS — M1990 Unspecified osteoarthritis, unspecified site: Secondary | ICD-10-CM | POA: Diagnosis present

## 2021-07-22 DIAGNOSIS — N136 Pyonephrosis: Secondary | ICD-10-CM | POA: Diagnosis present

## 2021-07-22 DIAGNOSIS — Z8249 Family history of ischemic heart disease and other diseases of the circulatory system: Secondary | ICD-10-CM

## 2021-07-22 DIAGNOSIS — A4151 Sepsis due to Escherichia coli [E. coli]: Principal | ICD-10-CM | POA: Diagnosis present

## 2021-07-22 DIAGNOSIS — Z6835 Body mass index (BMI) 35.0-35.9, adult: Secondary | ICD-10-CM

## 2021-07-22 DIAGNOSIS — I110 Hypertensive heart disease with heart failure: Secondary | ICD-10-CM | POA: Diagnosis present

## 2021-07-22 DIAGNOSIS — Z9109 Other allergy status, other than to drugs and biological substances: Secondary | ICD-10-CM

## 2021-07-22 DIAGNOSIS — Z8719 Personal history of other diseases of the digestive system: Secondary | ICD-10-CM

## 2021-07-22 DIAGNOSIS — N3010 Interstitial cystitis (chronic) without hematuria: Secondary | ICD-10-CM | POA: Diagnosis not present

## 2021-07-22 DIAGNOSIS — E669 Obesity, unspecified: Secondary | ICD-10-CM | POA: Diagnosis present

## 2021-07-22 DIAGNOSIS — N130 Hydronephrosis with ureteropelvic junction obstruction: Secondary | ICD-10-CM | POA: Diagnosis not present

## 2021-07-22 DIAGNOSIS — R609 Edema, unspecified: Secondary | ICD-10-CM | POA: Diagnosis present

## 2021-07-22 DIAGNOSIS — Z841 Family history of disorders of kidney and ureter: Secondary | ICD-10-CM

## 2021-07-22 DIAGNOSIS — E0781 Sick-euthyroid syndrome: Secondary | ICD-10-CM | POA: Diagnosis present

## 2021-07-22 DIAGNOSIS — Z20822 Contact with and (suspected) exposure to covid-19: Secondary | ICD-10-CM | POA: Diagnosis present

## 2021-07-22 DIAGNOSIS — N390 Urinary tract infection, site not specified: Principal | ICD-10-CM

## 2021-07-22 DIAGNOSIS — N12 Tubulo-interstitial nephritis, not specified as acute or chronic: Secondary | ICD-10-CM | POA: Diagnosis not present

## 2021-07-22 DIAGNOSIS — N3020 Other chronic cystitis without hematuria: Secondary | ICD-10-CM | POA: Diagnosis present

## 2021-07-22 DIAGNOSIS — R197 Diarrhea, unspecified: Secondary | ICD-10-CM

## 2021-07-22 DIAGNOSIS — R109 Unspecified abdominal pain: Secondary | ICD-10-CM | POA: Diagnosis not present

## 2021-07-22 DIAGNOSIS — Z882 Allergy status to sulfonamides status: Secondary | ICD-10-CM

## 2021-07-22 DIAGNOSIS — Z791 Long term (current) use of non-steroidal anti-inflammatories (NSAID): Secondary | ICD-10-CM

## 2021-07-22 DIAGNOSIS — I5032 Chronic diastolic (congestive) heart failure: Secondary | ICD-10-CM | POA: Diagnosis present

## 2021-07-22 DIAGNOSIS — E785 Hyperlipidemia, unspecified: Secondary | ICD-10-CM | POA: Diagnosis present

## 2021-07-22 LAB — CBC WITH DIFFERENTIAL/PLATELET
Abs Immature Granulocytes: 0.03 10*3/uL (ref 0.00–0.07)
Basophils Absolute: 0 10*3/uL (ref 0.0–0.1)
Basophils Relative: 0 %
Eosinophils Absolute: 0.1 10*3/uL (ref 0.0–0.5)
Eosinophils Relative: 1 %
HCT: 45.5 % (ref 36.0–46.0)
Hemoglobin: 15.5 g/dL — ABNORMAL HIGH (ref 12.0–15.0)
Immature Granulocytes: 0 %
Lymphocytes Relative: 7 %
Lymphs Abs: 0.8 10*3/uL (ref 0.7–4.0)
MCH: 32.2 pg (ref 26.0–34.0)
MCHC: 34.1 g/dL (ref 30.0–36.0)
MCV: 94.4 fL (ref 80.0–100.0)
Monocytes Absolute: 0.5 10*3/uL (ref 0.1–1.0)
Monocytes Relative: 4 %
Neutro Abs: 10.6 10*3/uL — ABNORMAL HIGH (ref 1.7–7.7)
Neutrophils Relative %: 88 %
Platelets: 285 10*3/uL (ref 150–400)
RBC: 4.82 MIL/uL (ref 3.87–5.11)
RDW: 12.7 % (ref 11.5–15.5)
WBC: 12.1 10*3/uL — ABNORMAL HIGH (ref 4.0–10.5)
nRBC: 0 % (ref 0.0–0.2)

## 2021-07-22 LAB — LACTIC ACID, PLASMA: Lactic Acid, Venous: 2.8 mmol/L (ref 0.5–1.9)

## 2021-07-22 LAB — COMPREHENSIVE METABOLIC PANEL
ALT: 23 U/L (ref 0–44)
AST: 27 U/L (ref 15–41)
Albumin: 4.6 g/dL (ref 3.5–5.0)
Alkaline Phosphatase: 62 U/L (ref 38–126)
Anion gap: 11 (ref 5–15)
BUN: 23 mg/dL (ref 8–23)
CO2: 25 mmol/L (ref 22–32)
Calcium: 9.3 mg/dL (ref 8.9–10.3)
Chloride: 102 mmol/L (ref 98–111)
Creatinine, Ser: 0.91 mg/dL (ref 0.44–1.00)
GFR, Estimated: >60 mL/min (ref >60)
Glucose, Bld: 153 mg/dL — ABNORMAL HIGH (ref 70–99)
Potassium: 4.3 mmol/L (ref 3.5–5.1)
Sodium: 138 mmol/L (ref 135–145)
Total Bilirubin: 1.1 mg/dL (ref 0.3–1.2)
Total Protein: 8.6 g/dL — ABNORMAL HIGH (ref 6.5–8.1)

## 2021-07-22 LAB — MAGNESIUM: Magnesium: 1.8 mg/dL (ref 1.7–2.4)

## 2021-07-22 LAB — URINALYSIS, ROUTINE W REFLEX MICROSCOPIC
Bilirubin Urine: NEGATIVE
Glucose, UA: NEGATIVE mg/dL
Ketones, ur: NEGATIVE mg/dL
Nitrite: POSITIVE — AB
Protein, ur: NEGATIVE mg/dL
Specific Gravity, Urine: 1.021 (ref 1.005–1.030)
WBC, UA: >50 WBC/hpf — ABNORMAL HIGH (ref 0–5)
pH: 5 (ref 5.0–8.0)

## 2021-07-22 LAB — TSH: TSH: 0.351 u[IU]/mL (ref 0.350–4.500)

## 2021-07-22 LAB — PHOSPHORUS: Phosphorus: 2.7 mg/dL (ref 2.5–4.6)

## 2021-07-22 LAB — PROCALCITONIN: Procalcitonin: 0.29 ng/mL

## 2021-07-22 LAB — RESP PANEL BY RT-PCR (FLU A&B, COVID) ARPGX2
Influenza A by PCR: NEGATIVE
Influenza B by PCR: NEGATIVE
SARS Coronavirus 2 by RT PCR: NEGATIVE

## 2021-07-22 LAB — LIPASE, BLOOD: Lipase: 53 U/L — ABNORMAL HIGH (ref 11–51)

## 2021-07-22 LAB — CK: Total CK: 79 U/L (ref 38–234)

## 2021-07-22 MED ORDER — ONDANSETRON HCL 4 MG/2ML IJ SOLN
4.0000 mg | Freq: Once | INTRAMUSCULAR | Status: AC
  Administered 2021-07-22: 4 mg via INTRAVENOUS
  Filled 2021-07-22: qty 2

## 2021-07-22 MED ORDER — AMITRIPTYLINE HCL 25 MG PO TABS
25.0000 mg | ORAL_TABLET | Freq: Every day | ORAL | Status: DC
  Administered 2021-07-22 – 2021-07-24 (×3): 25 mg via ORAL
  Filled 2021-07-22 (×3): qty 1

## 2021-07-22 MED ORDER — SODIUM CHLORIDE 0.9 % IV SOLN
1.0000 g | INTRAVENOUS | Status: DC
  Administered 2021-07-23 – 2021-07-24 (×2): 1 g via INTRAVENOUS
  Filled 2021-07-22 (×3): qty 10

## 2021-07-22 MED ORDER — SODIUM CHLORIDE 0.9 % IV SOLN
75.0000 mL/h | INTRAVENOUS | Status: DC
  Administered 2021-07-23: 75 mL/h via INTRAVENOUS

## 2021-07-22 MED ORDER — METHOCARBAMOL 500 MG PO TABS
500.0000 mg | ORAL_TABLET | Freq: Every day | ORAL | Status: DC
  Administered 2021-07-22 – 2021-07-24 (×3): 500 mg via ORAL
  Filled 2021-07-22 (×3): qty 1

## 2021-07-22 MED ORDER — GENTAMICIN SULFATE 40 MG/ML IJ SOLN: 2.0000 mg/kg | Freq: Once | INTRAVENOUS | Status: DC

## 2021-07-22 MED ORDER — ONDANSETRON HCL 4 MG PO TABS: 4.0000 mg | ORAL_TABLET | Freq: Four times a day (QID) | ORAL | Status: DC | PRN

## 2021-07-22 MED ORDER — GABAPENTIN 300 MG PO CAPS
300.0000 mg | ORAL_CAPSULE | Freq: Three times a day (TID) | ORAL | Status: DC
  Administered 2021-07-22 – 2021-07-25 (×9): 300 mg via ORAL
  Filled 2021-07-22 (×9): qty 1

## 2021-07-22 MED ORDER — MORPHINE SULFATE (PF) 4 MG/ML IV SOLN
4.0000 mg | Freq: Once | INTRAVENOUS | Status: AC
  Administered 2021-07-22: 4 mg via INTRAVENOUS
  Filled 2021-07-22: qty 1

## 2021-07-22 MED ORDER — ATORVASTATIN CALCIUM 40 MG PO TABS
40.0000 mg | ORAL_TABLET | Freq: Every day | ORAL | Status: DC
  Administered 2021-07-22 – 2021-07-24 (×3): 40 mg via ORAL
  Filled 2021-07-22 (×3): qty 1

## 2021-07-22 MED ORDER — LACTATED RINGERS IV BOLUS
500.0000 mL | Freq: Once | INTRAVENOUS | Status: AC
  Administered 2021-07-22: 500 mL via INTRAVENOUS

## 2021-07-22 MED ORDER — OXYCODONE-ACETAMINOPHEN 7.5-325 MG PO TABS
1.0000 | ORAL_TABLET | Freq: Three times a day (TID) | ORAL | Status: DC | PRN
  Administered 2021-07-23 – 2021-07-25 (×6): 1 via ORAL
  Filled 2021-07-22 (×6): qty 1

## 2021-07-22 MED ORDER — GENTAMICIN SULFATE 40 MG/ML IJ SOLN
7.0000 mg/kg | Freq: Once | INTRAVENOUS | Status: DC
  Filled 2021-07-22: qty 10.5

## 2021-07-22 MED ORDER — LACTATED RINGERS IV BOLUS
1000.0000 mL | Freq: Once | INTRAVENOUS | Status: AC
  Administered 2021-07-22: 1000 mL via INTRAVENOUS

## 2021-07-22 MED ORDER — IOHEXOL 300 MG/ML  SOLN
100.0000 mL | Freq: Once | INTRAMUSCULAR | Status: AC | PRN
  Administered 2021-07-22: 100 mL via INTRAVENOUS

## 2021-07-22 MED ORDER — CEFTRIAXONE SODIUM 1 G IJ SOLR
1.0000 g | Freq: Once | INTRAMUSCULAR | Status: AC
  Administered 2021-07-22: 1 g via INTRAVENOUS
  Filled 2021-07-22: qty 10

## 2021-07-22 MED ORDER — ONDANSETRON HCL 4 MG/2ML IJ SOLN: 4.0000 mg | Freq: Four times a day (QID) | INTRAMUSCULAR | Status: DC | PRN

## 2021-07-22 MED ORDER — ACETAMINOPHEN 325 MG PO TABS
650.0000 mg | ORAL_TABLET | Freq: Four times a day (QID) | ORAL | Status: DC | PRN
  Administered 2021-07-22: 650 mg via ORAL
  Filled 2021-07-22 (×2): qty 2

## 2021-07-22 MED ORDER — ACETAMINOPHEN 650 MG RE SUPP
650.0000 mg | Freq: Four times a day (QID) | RECTAL | Status: DC | PRN
Start: 2021-07-22 — End: 2021-07-25

## 2021-07-22 NOTE — Assessment & Plan Note (Addendum)
Urology is aware, may need repeat stenting make n.p.o. postmidnight and observe over night ?

## 2021-07-22 NOTE — Assessment & Plan Note (Signed)
-   treat with Rocephin  ?      await results of urine culture and adjust antibiotic coverage as needed ?Urology aware will see pt in consult and discuss possible stent palcement ?

## 2021-07-22 NOTE — Assessment & Plan Note (Signed)
S/p resection 

## 2021-07-22 NOTE — Subjective & Objective (Signed)
Patient have developed urinary tract infection last week and was treated with antibiotics she is not sure the names.  Initially her symptoms have improved but then she developed nausea vomiting and diarrhea.  Also has worsening dysuria.  And now left-sided flank pain.  As well as some suprapubic pain.  No fevers or chills no chest pain or shortness of breath.  Patient past history is complicated by chronic left ureteropelvic junction obstruction and chronic cystitis followed by urology.  She have had a few left ureteral stent exchanges and finally her stent was removed in November 2021 and she did well up until now. ?

## 2021-07-22 NOTE — ED Provider Notes (Signed)
?[facility] DEPT ?Provider Note ? ?CSN: [id number] ?Arrival date & time: 07/22/21 1455 ? ?Chief Complaint(s) ?Emesis, Diarrhea, and Abdominal Pain ? ?HPI ?[name] is a 76 y.o. female with PMH endometrial cancer post hysterectomy, known chronic left-sided hydronephrosis secondary to UPJ obstruction from a surgical complication, frequent need for stenting but last stent removed in November 2021 who presents emergency department for evaluation of nausea, vomiting, diarrhea and left flank pain.  Patient states that her pain began today with associated emesis and diarrhea.  She states that she normally takes antibiotics chronically and finished antibiotics 6 days ago after being diagnosed with a UTI by her primary care doctor.  She denies chest pain, shortness of breath, headache, fever or other systemic symptoms. ? ? ?Emesis ?Associated symptoms: abdominal pain and diarrhea   ?Diarrhea ?Associated symptoms: abdominal pain and vomiting   ?Abdominal Pain ?Associated symptoms: diarrhea and vomiting   ? ?Past Medical History ?Past Medical History:  ?Diagnosis Date  ? Arthritis   ? Cancer [facility])   ? ovarian,skin Ca. in mouth  ? Cataract   ? Bil cataracts removed  ? Chronic constipation   ? Chronic kidney disease   ? Chronic midline low back pain with sciatica   ? Colon polyp   ? large ascending colon polyp , scheduled for resection 02/ 2021  ? Full dentures   ? History of endometrial cancer 2012  ? s/p   TAH w/ BSO 11-02-2010  ? History of recurrent UTIs   ? History of sepsis   ? multiple times (some due to e.coli) last sepsis 07/ 2019  ? History of small bowel obstruction   ? x2  ,  hx bowel resection  ? HOH (hard of hearing)   ? Hyperlipidemia   ? Hypertension   ? Retroperitoneal fibrosis   ? Substance abuse ([place])   ? opiod dependancy  ? Ureteral obstruction, left urologist-- dr Alinda Money  ? secondary to adhesions/ retroperitoneal fibrosis, treated with ureteral stent  ? Vertigo   ? Wears  glasses   ? ?Patient Active Problem List  ? Diagnosis Date Noted  ? Pure hypertriglyceridemia 09/15/2020  ? Peripheral edema 03/20/2020  ? Colon polyp 08/23/2019  ? Adenomatous polyp of ascending colon 08/23/2019  ? Carpal tunnel syndrome of left wrist 03/16/2018  ? Carpal tunnel syndrome of right wrist 03/16/2018  ? Peripheral neuropathy 02/08/2016  ? Depression 02/08/2016  ? OA (osteoarthritis) of hip 07/04/2014  ? Retroperitoneal fibrosis in setting of chronic abscess at ureter 2010 02/21/2013  ? Pseudoarthrosis L3- L5 12/17/2012  ? Nocturnal leg cramps 11/13/2012  ? Recurrent ventral incisional hernia s/p lap repair 04/23/2013 04/27/2012  ? Obesity (BMI 30-39.9) 04/27/2012  ? Chronic midline thoracic back pain 04/27/2012  ? Hypertension   ? History of endometrial cancer s/p TAH BSO [id number] 06/15/2011  ? ?Home Medication(s) ?Prior to Admission medications   ?Medication Sig Start Date End Date Taking? Authorizing Provider  ?amitriptyline (ELAVIL) 25 MG tablet Take 1 tablet (25 mg total) by mouth at bedtime. 06/01/21   Chevis Pretty, FNP  ?atorvastatin (LIPITOR) 40 MG tablet Take 1 tablet (40 mg total) by mouth daily. 06/02/21   Chevis Pretty, FNP  ?furosemide (LASIX) 20 MG tablet Take 1 tablet (20 mg total) by mouth daily. 06/01/21   Hassell Done Mary-Margaret, FNP  ?gabapentin (NEURONTIN) 300 MG capsule Take 1 capsule (300 mg total) by mouth 3 (three) times daily. 03/09/21   Chevis Pretty, FNP  ?lisinopril (ZESTRIL) 20 MG tablet Take  1 tablet (20 mg total) by mouth daily. 06/01/21   Chevis Pretty, FNP  ?meloxicam (MOBIC) 7.5 MG tablet Take 1 tablet by mouth once daily 07/05/21   Chevis Pretty, FNP  ?methocarbamol (ROBAXIN) 500 MG tablet TAKE 1 TABLET BY MOUTH AT BEDTIME 05/17/21   Chevis Pretty, FNP  ?Multiple Vitamins-Minerals (PRESERVISION AREDS) CAPS Take 1 capsule by mouth in the morning and at bedtime.    [provider]  ?nystatin cream (MYCOSTATIN) Apply  topically 2 (two) times daily. 03/09/21   Chevis Pretty, FNP  ?oxyCODONE-acetaminophen (PERCOCET) 7.5-325 MG tablet Take 1 tablet by mouth every 8 (eight) hours as needed for severe pain. 08/06/21 09/05/21  Chevis Pretty, FNP  ?oxyCODONE-acetaminophen (PERCOCET) 7.5-325 MG tablet Take 1 tablet by mouth every 8 (eight) hours as needed for severe pain. 07/07/21 08/06/21  Chevis Pretty, FNP  ?                                                                                                                                  ?Past Surgical History ?Past Surgical History:  ?Procedure Laterality Date  ? ABDOMINAL HYSTERECTOMY  11/02/10  dr Alycia Rossetti  '@[facility]   ? TAH, BSO, incisional hernia repair , lysis of adhesions for endometrial cancer  ? ANTERIOR LUMBAR FUSION  01-28-2008   dr elsner  '@[facility]'$   ? L2 -- L4  ? BACK SURGERY    ? BUNIONECTOMY Right 2003  ? CATARACT EXTRACTION W/ INTRAOCULAR LENS  IMPLANT, BILATERAL  2007  ? COLONOSCOPY  last one 12-24-2018  ? CYSTO/  LEFT RETROGRADE PYELOGRAM/ URETEROSCOPY STENT PLACEMENT/  OPEN URETEROLYSIS WITH OMENTAL FLAP Left 03-11-2009    dr borden  '@[facility]'$   ? CYSTO/  [facility] SLING/  ANTERIOR REPAIR  12-06-2001    dr Jeffie Pollock '@[facility]   ? CYSTOSCOPY W/ URETERAL STENT PLACEMENT Left 12/07/2017  ? Procedure: CYSTOSCOPY WITH RETROGRADE PYELOGRAM/URETERAL STENT PLACEMENT;  Surgeon: Raynelle Bring, MD;  Location: [facility];  Service: Urology;  Laterality: Left;  ? CYSTOSCOPY W/ URETERAL STENT PLACEMENT Left 03/29/2018  ? Procedure: CYSTOSCOPY WITH STENT  EXCHANGE;  Surgeon: Raynelle Bring, MD;  Location: [facility];  Service: Urology;  Laterality: Left;  ? CYSTOSCOPY WITH RETROGRADE PYELOGRAM, URETEROSCOPY AND STENT PLACEMENT Left 12-29-2008   dr Alinda Money  '@[facility]'$   ? WITH BALLOON DILATION LEFT URETERAL STRICTURE  ? CYSTOSCOPY WITH RETROGRADE PYELOGRAM, URETEROSCOPY AND STENT PLACEMENT Left 09-12-2008    dr Jeffie Pollock '@[facility]   ? CYSTOSCOPY WITH RETROGRADE PYELOGRAM, URETEROSCOPY AND STENT PLACEMENT Left 03/23/2020  ?  Procedure: CYSTOSCOPY WITH LEFT RETROGRADE PYELOGRAM, AND LEFT STENT REMOVAL;  Surgeon: Raynelle Bring, MD;  Location: [facility];  Service: Urology;  Laterality: Left;  ? CYSTOSCOPY WITH STENT PLACEMENT Left 07/26/2018  ? Procedure: CYSTOSCOPY WITH STENT CHANGE;  Surgeon: Raynelle Bring, MD;  Location: [facility];  Service: Urology;  Laterality: Left;  ? CYSTOSCOPY WITH STENT PLACEMENT Left 12/03/2018  ? Procedure: CYSTOSCOPY WITH STENT CHANGE;  Surgeon: Raynelle Bring, MD;  Location: [facility];  Service: Urology;  Laterality: Left;  ? CYSTOSCOPY WITH STENT PLACEMENT Left 04/22/2019  ? Procedure: CYSTOSCOPY WITH STENT CHANGE;  Surgeon: Raynelle Bring, MD;  Location: [facility];  Service: Urology;  Laterality: Left;  ? CYSTOSCOPY WITH STENT PLACEMENT Left 10/03/2019  ? Procedure: CYSTOSCOPY WITH STENT CHANGE;  Surgeon: Raynelle Bring, MD;  Location: [facility];  Service: Urology;  Laterality: Left;  ? EYE SURGERY    ? FOOT SURGERY Left 06-23-2010   dr Beola Cord  ? left first and second toes  ? INSERTION OF MESH N/A 04/23/2013  ? Procedure: INSERTION OF MESH;  Surgeon: Adin Hector, MD;  Location: [facility];  Service: General;  Laterality: N/A;  ? JOINT REPLACEMENT    ? KNEE ARTHROSCOPY Right 04-27-2007  dr Shellia Carwin '@[facility]   ? LAPAROSCOPIC LYSIS OF ADHESIONS N/A 04/23/2013  ? Procedure: LAPAROSCOPIC LYSIS OF ADHESIONS;  Surgeon: Adin Hector, MD;  Location: [facility];  Service: General;  Laterality: N/A;  ? [place] and 1999  ? POSTERIOR LUMBAR FUSION  12/17/2012  ? L3 -- L5 laminectomy and L3 -- 5 fusion  ? TOTAL HIP ARTHROPLASTY Left 07/04/2014  ? Procedure: LEFT TOTAL HIP ARTHROPLASTY ANTERIOR APPROACH;  Surgeon: Gearlean Alf, MD;  Location: [facility];  Service: Orthopedics;  Laterality: Left;  ? TOTAL HIP ARTHROPLASTY Right 2009  ? TOTAL KNEE ARTHROPLASTY Right 09-15-2009   dr Shellia Carwin '@[facility]'$   ? VENTRAL HERNIA REPAIR N/A 07/19/2012  ? Procedure: LAPAROSCOPIC LYSIS OF ADHESIONS, SMALL BOWEL RESECTION,  SEROSAL REPAIR, PRIMARY VENTRAL HERNIA REPAIR;  Surgeon: Adin Hector, MD;  Location: [facility];  Service: General;  Laterality: N/A;  ? VENTRAL HERNIA REPAIR N/A 04/23/2013  ? Procedure: LAPAROSCOPIC exploratio

## 2021-07-22 NOTE — Assessment & Plan Note (Signed)
Follow up as outpatient.  

## 2021-07-22 NOTE — Assessment & Plan Note (Signed)
-  SIRS criteria met with   elevated white blood cell count,    ?   ?Component Value Date/Time  ? WBC 12.1 (H) 07/22/2021 1542  ? LYMPHSABS 0.8 07/22/2021 1542  ? LYMPHSABS 2.3 06/01/2021 1344  ? ? ? tachycardia   ,  fever   ?Today's Vitals  ? 07/22/21 1617 07/22/21 1730 07/22/21 1800 07/22/21 1900  ?BP: (!) 148/72 (!) 154/69 (!) 155/80 (!) 151/59  ?Pulse: (!) 103 89 90 90  ?Resp: $Remov'18 18 18   'aYsuDp$ ?Temp:      ?TempSrc:      ?SpO2: 96% 97% 100% 94%  ?Weight:      ?Height:      ?PainSc:      ? The recent clinical data is shown below. ?Vitals:  ? 07/22/21 1617 07/22/21 1730 07/22/21 1800 07/22/21 1900  ?BP: (!) 148/72 (!) 154/69 (!) 155/80 (!) 151/59  ?Pulse: (!) 103 89 90 90  ?Resp: $Remov'18 18 18   'zMiJzR$ ?Temp:      ?TempSrc:      ?SpO2: 96% 97% 100% 94%  ?Weight:      ?Height:      ? ? ? ?  ? ? ?-Most likely source being  urinary,    ?   ?Component Value Date/Time  ? PLT 285 07/22/2021 1542  ? PLT 296 06/01/2021 1344  ? ?  ? - Obtain serial lactic acid and procalcitonin level. ? - Initiated IV antibiotics in ER: ?Antibiotics Given (last 72 hours)   ? Date/Time Action Medication Dose Rate  ? 07/22/21 1729 New Bag/Given  ? cefTRIAXone (ROCEPHIN) 1 g in sodium chloride 0.9 % 100 mL IVPB 1 g 200 mL/hr  ?  ? ? ?Will continue  on : rocephin ? ? - await results of blood and urine culture ? - Rehydrate aggressively  ?Intravenous fluids were administered  ?  ?  ?7:12 PM ? ?

## 2021-07-22 NOTE — Progress Notes (Signed)
Patient  transferred from [facility]- ED to 1403. Ambulate with 1 asst to bathroom to urinate. Tolerated well. VSS at baseline except temp 101.0. Will medicate per MAR. Alert and oriented x 4. No c/o of pain.  ?

## 2021-07-22 NOTE — Consult Note (Signed)
Urology Consult  ? ?Physician requesting consult: Teressa Lower, MD ? ?Reason for consult: Chronic left ureteropelvic junction obstruction, cystitis ? ?History of Present Illness:  ?[name] is a patient of Dr. Alinda Money who is followed with a history of left ureteropelvic junction obstruction and chronic cystitis. ? ?She was formally managed with left ureteral stent exchanges for her left UPJ obstruction however her stent was removed in 03/2020 after there appeared to be adequate clearance of the contrast after stent removal.  She has not required stent replacement in over a year. ? ?She does have history of chronic cystitis and was last seen in the office in 04/2021 and has been managed with self start cephalexin.  She states that she developed a urinary tract infection last week and took a round of antibiotics which did initially improve her symptoms.  She then presented the ED today with complaints of nausea, emesis, diarrhea and dysuria.  She tells me that she is not having any left-sided flank pain.  She does note some vague anterior lower abdominal pain.  She has been afebrile and denies chills.  She denies chest pain or shortness of breath. ? ?CT A/P 07/22/2021 demonstrates stable chronic left hydronephrosis with chronic cortical scarring and atrophy. ?WBC is 12.1.  Creatinine stable at 0.91.  Urinalysis demonstrates positive nitrites.  Urine culture is pending. ? ?Past Medical History:  ?Diagnosis Date  ? Arthritis   ? Cancer [facility])   ? ovarian,skin Ca. in mouth  ? Cataract   ? Bil cataracts removed  ? Chronic constipation   ? Chronic kidney disease   ? Chronic midline low back pain with sciatica   ? Colon polyp   ? large ascending colon polyp , scheduled for resection 02/ 2021  ? Full dentures   ? History of endometrial cancer 2012  ? s/p   TAH w/ BSO 11-02-2010  ? History of recurrent UTIs   ? History of sepsis   ? multiple times (some due to e.coli) last sepsis 07/ 2019  ? History of small bowel  obstruction   ? x2  ,  hx bowel resection  ? HOH (hard of hearing)   ? Hyperlipidemia   ? Hypertension   ? Retroperitoneal fibrosis   ? Substance abuse ([place])   ? opiod dependancy  ? Ureteral obstruction, left urologist-- dr Alinda Money  ? secondary to adhesions/ retroperitoneal fibrosis, treated with ureteral stent  ? Vertigo   ? Wears glasses   ? ? ?Past Surgical History:  ?Procedure Laterality Date  ? ABDOMINAL HYSTERECTOMY  11/02/10  dr Alycia Rossetti  '@[facility]   ? TAH, BSO, incisional hernia repair , lysis of adhesions for endometrial cancer  ? ANTERIOR LUMBAR FUSION  01-28-2008   dr elsner  '@[facility]'$   ? L2 -- L4  ? BACK SURGERY    ? BUNIONECTOMY Right 2003  ? CATARACT EXTRACTION W/ INTRAOCULAR LENS  IMPLANT, BILATERAL  2007  ? COLONOSCOPY  last one 12-24-2018  ? CYSTO/  LEFT RETROGRADE PYELOGRAM/ URETEROSCOPY STENT PLACEMENT/  OPEN URETEROLYSIS WITH OMENTAL FLAP Left 03-11-2009    dr borden  '@[facility]'$   ? CYSTO/  [facility] SLING/  ANTERIOR REPAIR  12-06-2001    dr Jeffie Pollock '@[facility]   ? CYSTOSCOPY W/ URETERAL STENT PLACEMENT Left 12/07/2017  ? Procedure: CYSTOSCOPY WITH RETROGRADE PYELOGRAM/URETERAL STENT PLACEMENT;  Surgeon: Raynelle Bring, MD;  Location: [facility];  Service: Urology;  Laterality: Left;  ? CYSTOSCOPY W/ URETERAL STENT PLACEMENT Left 03/29/2018  ? Procedure: CYSTOSCOPY WITH STENT  EXCHANGE;  Surgeon: Alinda Money,  Wynetta Emery, MD;  Location: [facility];  Service: Urology;  Laterality: Left;  ? CYSTOSCOPY WITH RETROGRADE PYELOGRAM, URETEROSCOPY AND STENT PLACEMENT Left 12-29-2008   dr Alinda Money  '@[facility]'$   ? WITH BALLOON DILATION LEFT URETERAL STRICTURE  ? CYSTOSCOPY WITH RETROGRADE PYELOGRAM, URETEROSCOPY AND STENT PLACEMENT Left 09-12-2008    dr Jeffie Pollock '@[facility]   ? CYSTOSCOPY WITH RETROGRADE PYELOGRAM, URETEROSCOPY AND STENT PLACEMENT Left 03/23/2020  ? Procedure: CYSTOSCOPY WITH LEFT RETROGRADE PYELOGRAM, AND LEFT STENT REMOVAL;  Surgeon: Raynelle Bring, MD;  Location: [facility];  Service: Urology;  Laterality: Left;  ? CYSTOSCOPY WITH STENT PLACEMENT Left 07/26/2018  ?  Procedure: CYSTOSCOPY WITH STENT CHANGE;  Surgeon: Raynelle Bring, MD;  Location: [facility];  Service: Urology;  Laterality: Left;  ? CYSTOSCOPY WITH STENT PLACEMENT Left 12/03/2018  ? Procedure: CYSTOSCOPY WITH STENT CHANGE;  Surgeon: Raynelle Bring, MD;  Location: [facility];  Service: Urology;  Laterality: Left;  ? CYSTOSCOPY WITH STENT PLACEMENT Left 04/22/2019  ? Procedure: CYSTOSCOPY WITH STENT CHANGE;  Surgeon: Raynelle Bring, MD;  Location: [facility];  Service: Urology;  Laterality: Left;  ? CYSTOSCOPY WITH STENT PLACEMENT Left 10/03/2019  ? Procedure: CYSTOSCOPY WITH STENT CHANGE;  Surgeon: Raynelle Bring, MD;  Location: [facility];  Service: Urology;  Laterality: Left;  ? EYE SURGERY    ? FOOT SURGERY Left 06-23-2010   dr Beola Cord  ? left first and second toes  ? INSERTION OF MESH N/A 04/23/2013  ? Procedure: INSERTION OF MESH;  Surgeon: Adin Hector, MD;  Location: [facility];  Service: General;  Laterality: N/A;  ? JOINT REPLACEMENT    ? KNEE ARTHROSCOPY Right 04-27-2007  dr Shellia Carwin '@[facility]'$   ? LAPAROSCOPIC LYSIS OF ADHESIONS N/A 04/23/2013  ? Procedure: LAPAROSCOPIC LYSIS OF ADHESIONS;  Surgeon: Adin Hector, MD;  Location: [facility];  Service: General;  Laterality: N/A;  ? [place] and 1999  ? POSTERIOR LUMBAR FUSION  12/17/2012  ? L3 -- L5 laminectomy and L3 -- 5 fusion  ? TOTAL HIP ARTHROPLASTY Left 07/04/2014  ? Procedure: LEFT TOTAL HIP ARTHROPLASTY ANTERIOR APPROACH;  Surgeon: Gearlean Alf, MD;  Location: [place];  Service: Orthopedics;  Laterality: Left;  ? TOTAL HIP ARTHROPLASTY Right 2009  ? TOTAL KNEE ARTHROPLASTY Right 09-15-2009   dr Shellia Carwin '@[facility]'$   ? VENTRAL HERNIA REPAIR N/A 07/19/2012  ? Procedure: LAPAROSCOPIC LYSIS OF ADHESIONS, SMALL BOWEL RESECTION, SEROSAL REPAIR, PRIMARY VENTRAL HERNIA REPAIR;  Surgeon: Adin Hector, MD;  Location: [facility];  Service: General;  Laterality: N/A;  ? VENTRAL HERNIA REPAIR N/A 04/23/2013  ? Procedure: LAPAROSCOPIC exploration and  repair of hernia in abdominal VENTRAL wall  HERNIA;  Surgeon: Adin Hector, MD;  Location: [facility];  Service: General;  Laterality: N/A;  ? ? ? ?Current Hospital Medications: ? ?Home meds:  ?No current facility-administered medications on file prior to encounter.  ? ?Current Outpatient Medications on File Prior to Encounter  ?Medication Sig Dispense Refill  ? amitriptyline (ELAVIL) 25 MG tablet Take 1 tablet (25 mg total) by mouth at bedtime. 90 tablet 1  ? atorvastatin (LIPITOR) 40 MG tablet Take 1 tablet (40 mg total) by mouth daily. 90 tablet 1  ? furosemide (LASIX) 20 MG tablet Take 1 tablet (20 mg total) by mouth daily. 90 tablet 1  ? gabapentin (NEURONTIN) 300 MG capsule Take 1 capsule (300 mg total) by mouth 3 (three) times daily. 90 capsule 1  ? lisinopril (ZESTRIL) 20 MG tablet Take 1 tablet (20 mg  total) by mouth daily. 90 tablet 1  ? meloxicam (MOBIC) 7.5 MG tablet Take 1 tablet by mouth once daily 90 tablet 0  ? methocarbamol (ROBAXIN) 500 MG tablet TAKE 1 TABLET BY MOUTH AT BEDTIME 30 tablet 1  ? Multiple Vitamins-Minerals (PRESERVISION AREDS) CAPS Take 1 capsule by mouth in the morning and at bedtime.    ? nystatin cream (MYCOSTATIN) Apply topically 2 (two) times daily. 30 g 2  ? [START ON 08/06/2021] oxyCODONE-acetaminophen (PERCOCET) 7.5-325 MG tablet Take 1 tablet by mouth every 8 (eight) hours as needed for severe pain. 90 tablet 0  ? oxyCODONE-acetaminophen (PERCOCET) 7.5-325 MG tablet Take 1 tablet by mouth every 8 (eight) hours as needed for severe pain. 90 tablet 0  ?  ? ?Scheduled Meds: ?Continuous Infusions: ? gentamicin    ? ?PRN Meds:. ? ?Allergies:  ?Allergies  ?Allergen Reactions  ? Cashew Nut Oil Hives  ? Dog Epithelium Hives and Itching  ? Sulfa Antibiotics   ?  "blistering lips and mouth sores"  ? ? ?Family History  ?Problem Relation Age of Onset  ? Diabetes Mother   ? Lung cancer Mother   ? Diabetes Father   ? Liver cancer Father   ? Colon cancer Sister   ?     close to 65 per pt  ?  Diabetes Sister   ? Diabetes Sister   ? Hypertension Sister   ? Kidney disease Sister   ? Seizures Daughter   ? Diabetes Son   ? Breast cancer Neg Hx   ? Allergic rhinitis Neg Hx   ? Angioedema Neg Hx   ? Asthm

## 2021-07-22 NOTE — Assessment & Plan Note (Deleted)
-   treat with Rocephin  ?      await results of urine culture and adjust antibiotic coverage as needed ?Urology aware will see pt in consult and discuss possible stent palcement ?

## 2021-07-22 NOTE — Assessment & Plan Note (Signed)
Left > right, wil order doppler given hx of malignancy ?

## 2021-07-22 NOTE — Assessment & Plan Note (Signed)
Allow permissive HTN 

## 2021-07-22 NOTE — H&P (Signed)
? ? ?[name] VVO:[id number] DOB: [birth year] DOA: 07/22/2021 ?  ?  ?PCP: Chevis Pretty, FNP   ?Outpatient Specialists:    ?Doran Stabler, MD ?Urology Dr. Alinda Money   ? ?Patient arrived to ER on 07/22/21 at 1455 ?Referred by Attending Toy Baker, MD ? ? ?Patient coming from:   ? home Lives With family ?  ? ?Chief Complaint:   ?Chief Complaint  ?Patient presents with  ? Emesis  ? Diarrhea  ? Abdominal Pain  ? ? ?HPI: ?[name] is a 76 y.o. female with medical history significant of   ?Endometrial carcinoma status post hysterectomy, chronic left-sided hydronephrosis, hypertension, hyperlipidemia, constipation recurrent UTIs, ?Obesity ?Presented with nausea vomiting diarrhea ?Patient have developed urinary tract infection last week and was treated with antibiotics she is not sure the names.  Initially her symptoms have improved but then she developed nausea vomiting and diarrhea.  Also has worsening dysuria.  And now left-sided flank pain.  As well as some suprapubic pain.  No fevers or chills no chest pain or shortness of breath.  Patient past history is complicated by chronic left ureteropelvic junction obstruction and chronic cystitis followed by urology.  She have had a few left ureteral stent exchanges and finally her stent was removed in November 2021 and she did well up until now. ?  ?Chronic left leg edema for which she takes lasix denies hx of CHF ? ? Initial COVID TEST  ?NEGATIVE  ? ?Lab Results  ?Component Value Date  ? [place] NEGATIVE 07/22/2021  ? [place] NEGATIVE 03/19/2020  ? [place] NEGATIVE 09/30/2019  ? [place] NEGATIVE 08/20/2019  ? ?  ?Regarding pertinent Chronic problems:   ? Hyperlipidemia -  on statins Lipitor ?Lipid Panel  ?   ?Component Value Date/Time  ? CHOL 204 (H) 06/01/2021 1344  ? CHOL 154 11/13/2012 1018  ? TRIG 137 06/01/2021 1344  ? TRIG 86 11/13/2012 1018  ? HDL 62 06/01/2021 1344  ? HDL 57 11/13/2012 1018  ? CHOLHDL 3.3 06/01/2021  1344  ? LDLCALC 118 (H) 06/01/2021 1344  ? [place] 80 11/13/2012 1018  ? LABVLDL 24 06/01/2021 1344  ? ? ? HTN on lisinopril Lasix ?   ?  obesity-   ?BMI Readings from Last 1 Encounters:  ?07/22/21 35.15 kg/m?  ?  ?While in ER: ?  ?Patient initially tachycardic elevated white blood cell count but no fever.  Tachycardia improved with IV fluid rehydration ?CT abdomen showed evidence of chronic hydro on the left ?Urology was consulted will see patient in ER for now will admit for treatment of urinary tract infection   ?Gastric studies ordered UA consistent with UTI urine culture sent out  ? ?CTabd/pelvis - Chronic hydronephrosis of the LEFT kidney with chronic cortical ?scarring and atrophy. Obstruction apparently at the UPJ level. No ?distal hydroureter. ?   ?Following Medications were ordered in ER: ?Medications  ?gentamicin (GARAMYCIN) 420 mg in dextrose 5 % 100 mL IVPB (has no administration in time range)  ?lactated ringers bolus 1,000 mL (0 mLs Intravenous Stopped 07/22/21 1718)  ?ondansetron (ZOFRAN) injection 4 mg (4 mg Intravenous Given 07/22/21 1616)  ?morphine (PF) 4 MG/ML injection 4 mg (4 mg Intravenous Given 07/22/21 1615)  ?iohexol (OMNIPAQUE) 300 MG/ML solution 100 mL (100 mLs Intravenous Contrast Given 07/22/21 1654)  ?cefTRIAXone (ROCEPHIN) 1 g in sodium chloride 0.9 % 100 mL IVPB (0 g Intravenous Stopped 07/22/21 1818)  ?  ?_______________________________________________________ ?ER Provider Called:   Urology   Dr.  Gay  ?They Recommend admit to medicine   ? SEEN in ER ?  ?ED Triage Vitals  ?Enc Vitals Group  ?   BP 07/22/21 1511 120/81  ?   Pulse Rate 07/22/21 1511 (!) 116  ?   Resp 07/22/21 1511 18  ?   Temp 07/22/21 1511 99.2 ?F (37.3 ?C)  ?   Temp Source 07/22/21 1511 Oral  ?   SpO2 07/22/21 1511 94 %  ?   Weight 07/22/21 1518 180 lb (81.6 kg)  ?   Height 07/22/21 1518 5' (1.524 m)  ?   Head Circumference --   ?   Peak Flow --   ?   Pain Score 07/22/21 1517 2  ?   Pain Loc --   ?   Pain Edu? --   ?    Excl. in [place]? --   ?JKDT(26)@    ? _________________________________________ ?Significant initial  Findings: ?Abnormal Labs Reviewed  ?COMPREHENSIVE METABOLIC PANEL - Abnormal; Notable for the following components:  ?    Result Value  ? Glucose, Bld 153 (*)   ? Total Protein 8.6 (*)   ? All other components within normal limits  ?CBC WITH DIFFERENTIAL/PLATELET - Abnormal; Notable for the following components:  ? WBC 12.1 (*)   ? Hemoglobin 15.5 (*)   ? Neutro Abs 10.6 (*)   ? All other components within normal limits  ?LIPASE, BLOOD - Abnormal; Notable for the following components:  ? Lipase 53 (*)   ? All other components within normal limits  ?URINALYSIS, ROUTINE W REFLEX MICROSCOPIC - Abnormal; Notable for the following components:  ? APPearance HAZY (*)   ? Hgb urine dipstick SMALL (*)   ? Nitrite POSITIVE (*)   ? Leukocytes,Ua LARGE (*)   ? WBC, UA >50 (*)   ? Bacteria, UA MANY (*)   ? All other components within normal limits  ? ?  ?  ?ECG: Ordered ?Personally reviewed by me showing: ?HR : 91 ?Rhythm:  NSR,   ? no evidence of ischemic changes ?QTC 420 ?  ? ?The recent clinical data is shown below. ?Vitals:  ? 07/22/21 1518 07/22/21 1617 07/22/21 1730 07/22/21 1800  ?BP:  (!) 148/72 (!) 154/69 (!) 155/80  ?Pulse:  (!) 103 89 90  ?Resp:  '18 18 18  '$ ?Temp:      ?TempSrc:      ?SpO2:  96% 97% 100%  ?Weight: 81.6 kg     ?Height: 5' (1.524 m)     ?  ?WBC ? ?   ?Component Value Date/Time  ? WBC 12.1 (H) 07/22/2021 1542  ? LYMPHSABS 0.8 07/22/2021 1542  ? LYMPHSABS 2.3 06/01/2021 1344  ? MONOABS 0.5 07/22/2021 1542  ? EOSABS 0.1 07/22/2021 1542  ? EOSABS 0.2 06/01/2021 1344  ? BASOSABS 0.0 07/22/2021 1542  ? BASOSABS 0.0 06/01/2021 1344  ?  ? UA  evidence of UTI    ?  ?Urine analysis: ?   ?Component Value Date/Time  ? COLORURINE YELLOW 07/22/2021 1542  ? APPEARANCEUR HAZY (A) 07/22/2021 1542  ? APPEARANCEUR Cloudy (A) 12/07/2020 1012  ? LABSPEC 1.021 07/22/2021 1542  ? PHURINE 5.0 07/22/2021 1542  ? GLUCOSEU NEGATIVE  07/22/2021 1542  ? HGBUR SMALL (A) 07/22/2021 1542  ? [place] NEGATIVE 07/22/2021 1542  ? BILIRUBINUR Negative 12/07/2020 1012  ? [place] NEGATIVE 07/22/2021 1542  ? PROTEINUR NEGATIVE 07/22/2021 1542  ? UROBILINOGEN negative 09/30/2014 1049  ? UROBILINOGEN 0.2 07/07/2014 0537  ? NITRITE POSITIVE (A)  07/22/2021 1542  ? LEUKOCYTESUR LARGE (A) 07/22/2021 1542  ? ? ?Results for orders placed or performed during the hospital encounter of 07/22/21  ?Resp Panel by RT-PCR (Flu A&B, Covid) Nasopharyngeal Swab     Status: None  ? Collection Time: 07/22/21  3:42 PM  ? Specimen: Nasopharyngeal Swab; Nasopharyngeal(NP) swabs in vial transport medium  ?Result Value Ref Range Status  ? SARS Coronavirus 2 by RT PCR NEGATIVE NEGATIVE Final  ?      ? Influenza A by PCR NEGATIVE NEGATIVE Final  ? Influenza B by PCR NEGATIVE NEGATIVE Final  ?      ?  ?_______________________________________________ ?Hospitalist was called for admission for pyelonephritis ? ?The following Work up has been ordered so far: ? ?Orders Placed This Encounter  ?Procedures  ? Resp Panel by RT-PCR (Flu A&B, Covid) Nasopharyngeal Swab  ? C Difficile Quick Screen w PCR reflex  ? Gastrointestinal Panel by PCR , Stool  ? Urine Culture  ? CT ABDOMEN PELVIS W CONTRAST  ? Comprehensive metabolic panel  ? CBC with Differential  ? Lipase, blood  ? Urinalysis, Routine w reflex microscopic  ? Cardiac monitoring  ? Consult to hospitalist  ? Enteric precautions (UV disinfection) C difficile, Norovirus  ? Place in observation (patient's expected length of stay will be less than 2 midnights)  ?  ? ?OTHER Significant initial  Findings: ? ?labs showing:  ?Recent Labs  ?Lab 07/22/21 ?1542  ?NA 138  ?K 4.3  ?CO2 25  ?GLUCOSE 153*  ?BUN 23  ?CREATININE 0.91  ?CALCIUM 9.3  ? ? ?Cr  stable,  ?Lab Results  ?Component Value Date  ? CREATININE 0.91 07/22/2021  ? CREATININE 0.96 06/01/2021  ? CREATININE 0.93 03/09/2021  ? ? ?Recent Labs  ?Lab 07/22/21 ?1542  ?AST 27  ?ALT 23   ?ALKPHOS 62  ?BILITOT 1.1  ?PROT 8.6*  ?ALBUMIN 4.6  ? ?Lab Results  ?Component Value Date  ? CALCIUM 9.3 07/22/2021  ?  ?Plt: ?Lab Results  ?Component Value Date  ? PLT 285 07/22/2021  ?  ?COVID-19 Labs ? ?No

## 2021-07-22 NOTE — ED Triage Notes (Signed)
Patient reports emesis, diarrhea and 'left kidney pain' since today. States she has a hx of UTI after stent removal. Reports dysuria. Took her last abx Friday for her previous UTI.  ?

## 2021-07-23 ENCOUNTER — Observation Stay (HOSPITAL_COMMUNITY): Payer: Medicare HMO

## 2021-07-23 DIAGNOSIS — Z9109 Other allergy status, other than to drugs and biological substances: Secondary | ICD-10-CM | POA: Diagnosis not present

## 2021-07-23 DIAGNOSIS — Z841 Family history of disorders of kidney and ureter: Secondary | ICD-10-CM | POA: Diagnosis not present

## 2021-07-23 DIAGNOSIS — N12 Tubulo-interstitial nephritis, not specified as acute or chronic: Secondary | ICD-10-CM | POA: Diagnosis not present

## 2021-07-23 DIAGNOSIS — R000 Tachycardia, unspecified: Secondary | ICD-10-CM | POA: Diagnosis not present

## 2021-07-23 DIAGNOSIS — E669 Obesity, unspecified: Secondary | ICD-10-CM | POA: Diagnosis not present

## 2021-07-23 DIAGNOSIS — R197 Diarrhea, unspecified: Secondary | ICD-10-CM | POA: Diagnosis not present

## 2021-07-23 DIAGNOSIS — Z8249 Family history of ischemic heart disease and other diseases of the circulatory system: Secondary | ICD-10-CM | POA: Diagnosis not present

## 2021-07-23 DIAGNOSIS — N136 Pyonephrosis: Secondary | ICD-10-CM | POA: Diagnosis not present

## 2021-07-23 DIAGNOSIS — N3020 Other chronic cystitis without hematuria: Secondary | ICD-10-CM | POA: Diagnosis not present

## 2021-07-23 DIAGNOSIS — N390 Urinary tract infection, site not specified: Secondary | ICD-10-CM | POA: Diagnosis not present

## 2021-07-23 DIAGNOSIS — Z801 Family history of malignant neoplasm of trachea, bronchus and lung: Secondary | ICD-10-CM | POA: Diagnosis not present

## 2021-07-23 DIAGNOSIS — Z6835 Body mass index (BMI) 35.0-35.9, adult: Secondary | ICD-10-CM | POA: Diagnosis not present

## 2021-07-23 DIAGNOSIS — Z8719 Personal history of other diseases of the digestive system: Secondary | ICD-10-CM | POA: Diagnosis not present

## 2021-07-23 DIAGNOSIS — Z833 Family history of diabetes mellitus: Secondary | ICD-10-CM | POA: Diagnosis not present

## 2021-07-23 DIAGNOSIS — I110 Hypertensive heart disease with heart failure: Secondary | ICD-10-CM | POA: Diagnosis not present

## 2021-07-23 DIAGNOSIS — Z79899 Other long term (current) drug therapy: Secondary | ICD-10-CM | POA: Diagnosis not present

## 2021-07-23 DIAGNOSIS — Z800 Family history of malignant neoplasm of digestive organs: Secondary | ICD-10-CM | POA: Diagnosis not present

## 2021-07-23 DIAGNOSIS — E785 Hyperlipidemia, unspecified: Secondary | ICD-10-CM | POA: Diagnosis not present

## 2021-07-23 DIAGNOSIS — I5032 Chronic diastolic (congestive) heart failure: Secondary | ICD-10-CM | POA: Diagnosis not present

## 2021-07-23 DIAGNOSIS — A4151 Sepsis due to Escherichia coli [E. coli]: Secondary | ICD-10-CM | POA: Diagnosis not present

## 2021-07-23 DIAGNOSIS — Z8542 Personal history of malignant neoplasm of other parts of uterus: Secondary | ICD-10-CM | POA: Diagnosis not present

## 2021-07-23 DIAGNOSIS — Z791 Long term (current) use of non-steroidal anti-inflammatories (NSAID): Secondary | ICD-10-CM | POA: Diagnosis not present

## 2021-07-23 DIAGNOSIS — R609 Edema, unspecified: Secondary | ICD-10-CM | POA: Diagnosis not present

## 2021-07-23 DIAGNOSIS — N1330 Unspecified hydronephrosis: Secondary | ICD-10-CM | POA: Diagnosis not present

## 2021-07-23 DIAGNOSIS — Z96643 Presence of artificial hip joint, bilateral: Secondary | ICD-10-CM | POA: Diagnosis not present

## 2021-07-23 DIAGNOSIS — E0781 Sick-euthyroid syndrome: Secondary | ICD-10-CM | POA: Diagnosis not present

## 2021-07-23 DIAGNOSIS — Z20822 Contact with and (suspected) exposure to covid-19: Secondary | ICD-10-CM | POA: Diagnosis not present

## 2021-07-23 DIAGNOSIS — A419 Sepsis, unspecified organism: Secondary | ICD-10-CM | POA: Diagnosis not present

## 2021-07-23 DIAGNOSIS — Z882 Allergy status to sulfonamides status: Secondary | ICD-10-CM | POA: Diagnosis not present

## 2021-07-23 LAB — COMPREHENSIVE METABOLIC PANEL
ALT: 18 U/L (ref 0–44)
AST: 21 U/L (ref 15–41)
Albumin: 3.5 g/dL (ref 3.5–5.0)
Alkaline Phosphatase: 49 U/L (ref 38–126)
Anion gap: 9 (ref 5–15)
BUN: 17 mg/dL (ref 8–23)
CO2: 23 mmol/L (ref 22–32)
Calcium: 8.1 mg/dL — ABNORMAL LOW (ref 8.9–10.3)
Chloride: 102 mmol/L (ref 98–111)
Creatinine, Ser: 0.65 mg/dL (ref 0.44–1.00)
GFR, Estimated: >60 mL/min (ref >60)
Glucose, Bld: 123 mg/dL — ABNORMAL HIGH (ref 70–99)
Potassium: 3.7 mmol/L (ref 3.5–5.1)
Sodium: 134 mmol/L — ABNORMAL LOW (ref 135–145)
Total Bilirubin: 0.9 mg/dL (ref 0.3–1.2)
Total Protein: 6.7 g/dL (ref 6.5–8.1)

## 2021-07-23 LAB — MAGNESIUM: Magnesium: 1.9 mg/dL (ref 1.7–2.4)

## 2021-07-23 LAB — CBC WITH DIFFERENTIAL/PLATELET
Abs Immature Granulocytes: 0.02 10*3/uL (ref 0.00–0.07)
Basophils Absolute: 0 10*3/uL (ref 0.0–0.1)
Basophils Relative: 0 %
Eosinophils Absolute: 0 10*3/uL (ref 0.0–0.5)
Eosinophils Relative: 0 %
HCT: 37.2 % (ref 36.0–46.0)
Hemoglobin: 12.5 g/dL (ref 12.0–15.0)
Immature Granulocytes: 0 %
Lymphocytes Relative: 12 %
Lymphs Abs: 1 10*3/uL (ref 0.7–4.0)
MCH: 32.1 pg (ref 26.0–34.0)
MCHC: 33.6 g/dL (ref 30.0–36.0)
MCV: 95.6 fL (ref 80.0–100.0)
Monocytes Absolute: 0.7 10*3/uL (ref 0.1–1.0)
Monocytes Relative: 9 %
Neutro Abs: 6.3 10*3/uL (ref 1.7–7.7)
Neutrophils Relative %: 79 %
Platelets: 219 10*3/uL (ref 150–400)
RBC: 3.89 MIL/uL (ref 3.87–5.11)
RDW: 13 % (ref 11.5–15.5)
WBC: 8.1 10*3/uL (ref 4.0–10.5)
nRBC: 0 % (ref 0.0–0.2)

## 2021-07-23 LAB — TSH: TSH: 0.271 u[IU]/mL — ABNORMAL LOW (ref 0.350–4.500)

## 2021-07-23 LAB — LACTIC ACID, PLASMA
Lactic Acid, Venous: 1 mmol/L (ref 0.5–1.9)
Lactic Acid, Venous: 1 mmol/L (ref 0.5–1.9)

## 2021-07-23 LAB — PHOSPHORUS: Phosphorus: 3.4 mg/dL (ref 2.5–4.6)

## 2021-07-23 LAB — CORTISOL: Cortisol, Plasma: 28.4 ug/dL

## 2021-07-23 MED ORDER — SODIUM CHLORIDE 0.9 % IV SOLN: INTRAVENOUS | Status: DC | PRN

## 2021-07-23 NOTE — Progress Notes (Signed)
LLE venous duplex has been completed. ? ?Results can be found under chart review under CV PROC. ?07/23/2021 9:33 AM ?Baylin Gamblin RVT, RDMS ? ?

## 2021-07-23 NOTE — Progress Notes (Signed)
?PROGRESS NOTE ?[name]  YTW:[id number] DOB: 02/11/1946 DOA: 07/22/2021 ?PCP: Chevis Pretty, FNP  ? ?Brief Narrative/Hospital Course: ?76 y.o. female with medical history significant of  Endometrial carcinoma status post hysterectomy, chronic left-sided hydronephrosis, hypertension, hyperlipidemia, constipation recurrent UTIs, chronic left leg edema with CHF, class II obesity P/w nausea vomiting diarrhea after recent UTI last week for which was treated with antibiotics, her symptoms initially improved but then developed nausea vomiting and diarrhea worsening dysuria and left-sided flank pain,suprapubic pain. She has history is complicated by chronic left ureteropelvic junction obstruction and chronic cystitis followed by urology has had few left ureteral stent exchanges and finally her stent was removed in November 2021 and she did well up until now. ?In the ED initially tachycardic leukocytosis given IV fluids CT abdomen showed evidence of chronic hydro on the left with chronic cortical scarring and atrophy obstruction apparently at the UPJ area, urology was consulted placed on IV antibiotics and admitted. ?  ?Subjective: ?Seen this am ?Feels much better ?No abd pain ?No more diarrhea- had diarrhea PTA ? ?Assessment and Plan: ?Principal Problem: ?  Sepsis ([place]) ?Active Problems: ?  Hydronephrosis ?  Pyelonephritis ?  History of endometrial cancer s/p TAH BSO [phone] ?  Hypertension ?  Obesity (BMI 30-39.9) ?  Peripheral edema ?  Diarrhea ?Sepsis POA due to leukocytosis tachycardia secondary to UTI ?Cystitis/UTI with chronic hydronephrosis on the left ?History of recurrent UTI ?Chronic left ureteropelvic junction obstruction-previously managed with left ureteral stent: ?Tmax 101 last night, with lactic acidosis 2.8 tachycardia and leukocytosis which has now resolved.  Appreciate urology input on board. Continue ceftriaxone pending urine and blood culture ? ?Diarrhea: No recurrence since admission.   C. difficile/GI panel pending ?Essential hypertension: BP stable.  Holding home on lisinopril and Lasix ?Peripheral edema/chronic diastolic CHF, edema more on the left, follow-up Doppler ?History of endometrial cancer status post TAH/BSO in June 2013, outpatient follow-up ?Hyperlipidemia continue statin. ?Class aii Obesity:Patient's Body mass index is 35.15 kg/m?. : Will benefit with PCP follow-up, weight loss  healthy lifestyle and outpatient sleep evaluation. ? ?DVT prophylaxis: SCDs Start: 07/22/21 2035 ?Code Status:   Code Status: Full Code ?Family Communication: plan of care discussed with patient at bedside. ?Disposition: Currently not medically stable for discharge. ?Status is: OBSERVATION  Level of care: Telemetry  ?Remains inpatient because: Ongoing need of management of infection and sepsis as above ?Dispo: The patient is from: Home lives with son and daughter-in-law ?           Anticipated d/c is to: Home ?           Patient currently not stable ? ?Mobility Assessment (last 72 hours)   ? ? Mobility Assessment   ? ? [place] Name 07/22/21 2100  ?  ?  ?  ?  ? Does patient have an order for bedrest or is patient medically unstable No - Continue assessment      ? What is the highest level of mobility based on the progressive mobility assessment? Level 5 (Walks with assist in room/hall) - Balance while stepping forward/back and can walk in room with assist - Complete      ? ?  ?  ? ?  ?  ? ?Objective: ?Vitals last 24 hrs: ?Vitals:  ? 07/22/21 2023 07/22/21 2208 07/23/21 0124 07/23/21 0413  ?BP: (!) 150/67 (!) 118/58 (!) 123/56 (!) 127/58  ?Pulse: (!) 104 96 92 74  ?Resp: '19 19 20   '$ ?Temp: (!) 101 ?F (38.3 ?C)  100 ?F (37.8 ?C) (!) 100.4 ?F (38 ?C) 99.9 ?F (37.7 ?C)  ?TempSrc: Oral Oral Oral Oral  ?SpO2: 96% 94% 91% 90%  ?Weight:      ?Height:      ? ?Weight change:  ? ?Physical Examination: ?General exam: AA0x3,older than stated age, weak appearing. ?HEENT:Oral mucosa moist, Ear/Nose WNL grossly, dentition  normal. ?Respiratory system: bilaterally diminished BS, no use of accessory muscle ?Cardiovascular system: S1 & S2 +, No JVD,. ?Gastrointestinal system: Abdomen soft,NT,ND, BS+ ?Nervous System:Alert, awake, moving extremities and grossly nonfocal ?Extremities: LE edema none,distal peripheral pulses palpable.  ?Skin: No rashes,no icterus. ?MSK: Normal muscle bulk,tone, power ? ?Medications reviewed:  ?Scheduled Meds: ? amitriptyline  25 mg Oral QHS  ? atorvastatin  40 mg Oral QHS  ? gabapentin  300 mg Oral TID  ? methocarbamol  500 mg Oral QHS  ? ?Continuous Infusions: ? sodium chloride    ? cefTRIAXone (ROCEPHIN)  IV    ? ? ?  ?Diet Order   ? ?       ?  Diet Heart Room service appropriate? Yes; Fluid consistency: Thin  Diet effective now       ?  ? ?  ?  ? ?  ?  ? ?  ?  ?  ? ? ?Intake/Output Summary (Last 24 hours) at 07/23/2021 1005 ?Last data filed at 07/23/2021 0827 ?Gross per 24 hour  ?Intake 2216.47 ml  ?Output 100 ml  ?Net 2116.47 ml  ? ?Net IO Since Admission: 2,116.47 mL [07/23/21 1005]  ?Wt Readings from Last 3 Encounters:  ?07/22/21 81.6 kg  ?06/21/21 85.7 kg  ?06/01/21 85.7 kg  ?  ? ?Unresulted Labs (From admission, onward)  ? ?  Start     Ordered  ? 07/22/21 1720  Urine Culture  Once,   STAT       ?Question:  Indication  Answer:  Dysuria  ? 07/22/21 1719  ? 07/22/21 1607  Gastrointestinal Panel by PCR , Stool  (Gastrointestinal Panel by PCR, Stool                                                                                                                                                     **Does Not include CLOSTRIDIUM DIFFICILE testing. **If CDIFF testing is needed, place order from the "C Difficile Testing" order set.**)  Once,   STAT       ? 07/22/21 1606  ? 07/22/21 1606  C Difficile Quick Screen w PCR reflex  (C Difficile quick screen w PCR reflex panel )  Once, for 24 hours,   STAT       ?References:    CDiff Information Tool  ? 07/22/21 1606  ? ?  ?  ? ?  ?Data Reviewed: I have personally  reviewed following labs and imaging studies ?CBC: ?Recent Labs  ?  Lab 07/22/21 ?1542 07/23/21 ?0432  ?WBC 12.1* 8.1  ?NEUTROABS 10.6* 6.3  ?HGB 15.5* 12.5  ?HCT 45.5 37.2  ?MCV 94.4 95.6  ?PLT 285 219  ? ?Basic Metabolic Panel: ?Recent Labs  ?Lab 07/22/21 ?1542 07/22/21 ?2130 07/23/21 ?0432  ?NA 138  --  134*  ?K 4.3  --  3.7  ?CL 102  --  102  ?CO2 25  --  23  ?GLUCOSE 153*  --  123*  ?BUN 23  --  17  ?CREATININE 0.91  --  0.65  ?CALCIUM 9.3  --  8.1*  ?MG  --  1.8 1.9  ?PHOS  --  2.7 3.4  ? ?GFR: ?Estimated Creatinine Clearance: 56.6 mL/min (by C-G formula based on SCr of 0.65 mg/dL). ?Liver Function Tests: ?Recent Labs  ?Lab 07/22/21 ?1542 07/23/21 ?0432  ?AST 27 21  ?ALT 23 18  ?ALKPHOS 62 49  ?BILITOT 1.1 0.9  ?PROT 8.6* 6.7  ?ALBUMIN 4.6 3.5  ? ?Recent Labs  ?Lab 07/22/21 ?1542  ?LIPASE 53*  ? ?No results for input(s): AMMONIA in the last 168 hours. ?Coagulation Profile: ?No results for input(s): INR, PROTIME in the last 168 hours. ?BNP (last 3 results) ?No results for input(s): PROBNP in the last 8760 hours. ?HbA1C: ?No results for input(s): HGBA1C in the last 72 hours. ?CBG: ?No results for input(s): GLUCAP in the last 168 hours. ?Lipid Profile: ?No results for input(s): CHOL, HDL, LDLCALC, TRIG, CHOLHDL, LDLDIRECT in the last 72 hours. ?Thyroid Function Tests: ?Recent Labs  ?  07/23/21 ?0432  ?TSH 0.271*  ? ?Sepsis Labs: ?Recent Labs  ?Lab 07/22/21 ?2130 07/23/21 ?0144 07/23/21 ?0432  ?PROCALCITON 0.29  --   --   ?LATICACIDVEN 2.8* 1.0 1.0  ? ? ?Recent Results (from the past 240 hour(s))  ?Resp Panel by RT-PCR (Flu A&B, Covid) Nasopharyngeal Swab     Status: None  ? Collection Time: 07/22/21  3:42 PM  ? Specimen: Nasopharyngeal Swab; Nasopharyngeal(NP) swabs in vial transport medium  ?Result Value Ref Range Status  ? SARS Coronavirus 2 by RT PCR NEGATIVE NEGATIVE Final  ?  Comment: (NOTE) ?SARS-CoV-2 target nucleic acids are NOT DETECTED. ? ?The SARS-CoV-2 RNA is generally detectable in upper  respiratory ?specimens during the acute phase of infection. The lowest ?concentration of SARS-CoV-2 viral copies this assay can detect is ?138 copies/mL. A negative result does not preclude SARS-Cov-2 ?infection and should not be

## 2021-07-23 NOTE — Hospital Course (Addendum)
76 y.o. female with medical history significant of  Endometrial carcinoma status post hysterectomy, chronic left-sided hydronephrosis, hypertension, hyperlipidemia, constipation recurrent UTIs, chronic left leg edema with CHF, class II obesity P/w nausea vomiting diarrhea after recent UTI last week for which was treated with antibiotics, her symptoms initially improved but then developed nausea vomiting and diarrhea worsening dysuria and left-sided flank pain,suprapubic pain. She has history is complicated by chronic left ureteropelvic junction obstruction and chronic cystitis followed by urology has had few left ureteral stent exchanges and finally her stent was removed in November 2021 and she did well up until now. ?In the ED initially tachycardic leukocytosis given IV fluids CT abdomen showed evidence of chronic hydro on the left with chronic cortical scarring and atrophy obstruction apparently at the UPJ area, urology was consulted placed on IV antibiotics and admitted. ?

## 2021-07-23 NOTE — Plan of Care (Signed)
Pt satisfied with pain regimen. Pt had 1 smear stool into depends unable to collect for GI panel ? ? ?Problem: Education: ?Goal: Knowledge of General Education information will improve ?Description: Including pain rating scale, medication(s)/side effects and non-pharmacologic comfort measures ?Outcome: Progressing ?  ?Problem: Health Behavior/Discharge Planning: ?Goal: Ability to manage health-related needs will improve ?Outcome: Progressing ?  ?Problem: Clinical Measurements: ?Goal: Ability to maintain clinical measurements within normal limits will improve ?Outcome: Progressing ?Goal: Will remain free from infection ?Outcome: Progressing ?Goal: Diagnostic test results will improve ?Outcome: Progressing ?  ?Problem: Activity: ?Goal: Risk for activity intolerance will decrease ?Outcome: Progressing ?  ?Problem: Nutrition: ?Goal: Adequate nutrition will be maintained ?Outcome: Progressing ?  ?Problem: Coping: ?Goal: Level of anxiety will decrease ?Outcome: Progressing ?  ?Problem: Elimination: ?Goal: Will not experience complications related to bowel motility ?Outcome: Progressing ?Goal: Will not experience complications related to urinary retention ?Outcome: Progressing ?  ?Problem: Pain Managment: ?Goal: General experience of comfort will improve ?Outcome: Progressing ?  ?Problem: Safety: ?Goal: Ability to remain free from injury will improve ?Outcome: Progressing ?  ?Problem: Skin Integrity: ?Goal: Risk for impaired skin integrity will decrease ?Outcome: Progressing ?  ?

## 2021-07-24 MED ORDER — SODIUM CHLORIDE 0.9 % IV SOLN
INTRAVENOUS | Status: DC | PRN
Start: 2021-07-24 — End: 2021-07-25

## 2021-07-24 NOTE — Progress Notes (Signed)
[facility] ? ?PROGRESS NOTE ? ?[name] CWU:[id number] DOB: November 17, 1945 DOA: 07/22/2021 ?PCP: Whitney Pretty, FNP ? ?Brief hospital course ? ?76 y.o. female with medical history significant of  Endometrial carcinoma status post hysterectomy, chronic left-sided hydronephrosis, hypertension, hyperlipidemia, constipation recurrent UTIs, chronic left leg edema with CHF, class II obesity P/w nausea vomiting diarrhea after recent UTI last week for which was treated with antibiotics, her symptoms initially improved but then developed nausea vomiting and diarrhea worsening dysuria and left-sided flank pain,suprapubic pain. She has history is complicated by chronic left ureteropelvic junction obstruction and chronic cystitis followed by urology has had few left ureteral stent exchanges and finally her stent was removed in November 2021 and she did well up until now. ?In the ED initially tachycardic leukocytosis given IV fluids CT abdomen showed evidence of chronic hydro on the left with chronic cortical scarring and atrophy obstruction apparently at the UPJ area, urology was consulted placed on IV antibiotics and admitted.  ? ? ? ?Subjective  ? ?Patient seen and examined, denies any complaints.  No abdominal pain.  Urine culture growing E. coli, sensitivities are pending. ? ?  ? ?Assessment and Plan: ? ?Sepsis due to UTI ?-Patient has chronic hydronephrosis on the left ?-Has history of cystitis/UTI ?-Chronic left ureteropelvic junction obstruction-previously managed with left ureteral stent ?-Urology saw the patient, recommend to treat with antibiotics ?-Patient is currently on ceftriaxone, urine culture grew E. Coli; sensitivities pending ?-Follow urine culture result and adjust antibiotics as per culture sensitivities ? ?Diarrhea ?-No recurrence since admission ?-Stool for C. difficile pending ? ?Hypertension ?-Blood pressure is stable ? ?Peripheral edema/chronic diastolic CHF ?-Venous duplex of left  lower extremity is negative for DVT ? ?History of endometrial cancer ?-S/p TAH/BSO in 2013 ? ?Hyperlipidemia ?-Continue statin ? ? ?Medications ? ?  ? amitriptyline  25 mg Oral QHS  ? atorvastatin  40 mg Oral QHS  ? gabapentin  300 mg Oral TID  ? methocarbamol  500 mg Oral QHS  ? ? ? Data Reviewed:  ? ?CBG: ? ?No results for input(s): GLUCAP in the last 168 hours. ? ?SpO2: 98 %  ? ? ?Vitals:  ? 07/23/21 1130 07/23/21 2149 07/24/21 0551 07/24/21 1309  ?BP: 132/66 (!) 128/49 (!) 130/58 (!) 116/54  ?Pulse: 80 (!) 57 61 (!) 57  ?Resp: '19 17 18 14  '$ ?Temp: 98.2 ?F (36.8 ?C) 98.8 ?F (37.1 ?C) 98 ?F (36.7 ?C) 98.4 ?F (36.9 ?C)  ?TempSrc: Oral Oral Oral Oral  ?SpO2: 97% 97% 93% 98%  ?Weight:      ?Height:      ? ? ? ? ?Data Reviewed: ? ?Basic Metabolic Panel: ?Recent Labs  ?Lab 07/22/21 ?1542 07/22/21 ?2130 07/23/21 ?0432  ?NA 138  --  134*  ?K 4.3  --  3.7  ?CL 102  --  102  ?CO2 25  --  23  ?GLUCOSE 153*  --  123*  ?BUN 23  --  17  ?CREATININE 0.91  --  0.65  ?CALCIUM 9.3  --  8.1*  ?MG  --  1.8 1.9  ?PHOS  --  2.7 3.4  ? ? ?CBC: ?Recent Labs  ?Lab 07/22/21 ?1542 07/23/21 ?0432  ?WBC 12.1* 8.1  ?NEUTROABS 10.6* 6.3  ?HGB 15.5* 12.5  ?HCT 45.5 37.2  ?MCV 94.4 95.6  ?PLT 285 219  ? ? ?LFT ?Recent Labs  ?Lab 07/22/21 ?1542 07/23/21 ?0432  ?AST 27 21  ?ALT 23 18  ?ALKPHOS 62 49  ?BILITOT 1.1 0.9  ?  PROT 8.6* 6.7  ?ALBUMIN 4.6 3.5  ? ?  ?Micro : Urine culture growing E. coli, sensitivity pending ? ? ? ?Antibiotics: ?Anti-infectives (From admission, onward)  ? ? Start     Dose/Rate Route Frequency Ordered Stop  ? 07/23/21 1700  cefTRIAXone (ROCEPHIN) 1 g in sodium chloride 0.9 % 100 mL IVPB       ? 1 g ?200 mL/hr over 30 Minutes Intravenous Every 24 hours 07/22/21 2035    ? 07/22/21 1845  gentamicin (GARAMYCIN) 420 mg in dextrose 5 % 100 mL IVPB  Status:  Discontinued       ? 7 mg/kg ? 59.9 kg (Adjusted) ?110.5 mL/hr over 60 Minutes Intravenous  Once 07/22/21 1749 07/22/21 2051  ? 07/22/21 1745  gentamicin (GARAMYCIN) 160 mg in  dextrose 5 % 50 mL IVPB  Status:  Discontinued       ? 2 mg/kg ? 81.6 kg ?108 mL/hr over 30 Minutes Intravenous  Once 07/22/21 1735 07/22/21 1749  ? 07/22/21 1730  cefTRIAXone (ROCEPHIN) 1 g in sodium chloride 0.9 % 100 mL IVPB       ? 1 g ?200 mL/hr over 30 Minutes Intravenous  Once 07/22/21 1720 07/22/21 1818  ? ?  ? ? ? ? ? ?DVT prophylaxis: SCDs ? ?Code Status: Full code ? ?Family Communication: No family at bedside ? ?CONSULTS: Urology ? ? ? ?Objective  ? ? ?Physical Examination: ? ? ?General-appears in no acute distress ?Heart-S1-S2, regular, no murmur auscultated ?Lungs-clear to auscultation bilaterally, no wheezing or crackles auscultated ?Abdomen-soft, nontender, no organomegaly ?Extremities-no edema in the lower extremities ?Neuro-alert, oriented x3, no focal deficit noted ? ? ?Status is: Inpatient: Sepsis due to UTI ? ? ? ?  ? ? ?[name] ?  ?[facility] ?If 7PM-7AM, please contact night-coverage at [email or web address], ?Office  [phone] ? ? ?07/24/2021, 2:33 PM  LOS: 1 day  ? ? ? ? ? ? ? ? ? ? ?  ?

## 2021-07-25 DIAGNOSIS — N390 Urinary tract infection, site not specified: Secondary | ICD-10-CM | POA: Diagnosis not present

## 2021-07-25 DIAGNOSIS — N12 Tubulo-interstitial nephritis, not specified as acute or chronic: Secondary | ICD-10-CM | POA: Diagnosis not present

## 2021-07-25 DIAGNOSIS — N1330 Unspecified hydronephrosis: Secondary | ICD-10-CM | POA: Diagnosis not present

## 2021-07-25 LAB — URINE CULTURE: Culture: >=100000 — AB

## 2021-07-25 MED ORDER — CEPHALEXIN 500 MG PO CAPS: 500.0000 mg | ORAL_CAPSULE | Freq: Three times a day (TID) | ORAL | 0 refills | Status: AC

## 2021-07-25 NOTE — Progress Notes (Signed)
Pt being seen in hospital d/t UTI. Pt sitting in bed in nad, just finished breakfast. Pt denies cp, sob, n/v/d and is afebrile. Pt denies any further needs at this time bed locked in lowest position call bell within reach. ?

## 2021-07-25 NOTE — Plan of Care (Signed)
?  Problem: Education: ?Goal: Knowledge of General Education information will improve ?Description: Including pain rating scale, medication(s)/side effects and non-pharmacologic comfort measures ?Outcome: Adequate for Discharge ?  ?Problem: Health Behavior/Discharge Planning: ?Goal: Ability to manage health-related needs will improve ?Outcome: Adequate for Discharge ?  ?Problem: Clinical Measurements: ?Goal: Ability to maintain clinical measurements within normal limits will improve ?Outcome: Adequate for Discharge ?Goal: Will remain free from infection ?Outcome: Adequate for Discharge ?Goal: Diagnostic test results will improve ?Outcome: Adequate for Discharge ?  ?Problem: Activity: ?Goal: Risk for activity intolerance will decrease ?Outcome: Adequate for Discharge ?  ?Problem: Nutrition: ?Goal: Adequate nutrition will be maintained ?Outcome: Adequate for Discharge ?  ?Problem: Coping: ?Goal: Level of anxiety will decrease ?Outcome: Adequate for Discharge ?  ?Problem: Elimination: ?Goal: Will not experience complications related to bowel motility ?Outcome: Adequate for Discharge ?Goal: Will not experience complications related to urinary retention ?Outcome: Adequate for Discharge ?  ?Problem: Pain Managment: ?Goal: General experience of comfort will improve ?Outcome: Adequate for Discharge ?  ?Problem: Safety: ?Goal: Ability to remain free from injury will improve ?Outcome: Adequate for Discharge ?  ?Problem: Skin Integrity: ?Goal: Risk for impaired skin integrity will decrease ?Outcome: Adequate for Discharge ?  ?

## 2021-07-25 NOTE — Discharge Summary (Addendum)
?Physician Discharge Summary ?  ?Patient: [name] MRN: [id number] DOB: October 28, 1945  ?Admit date:     07/22/2021  ?Discharge date: 07/25/21  ?Discharge Physician: [name]  ? ?PCP: Chevis Pretty, FNP  ? ?Recommendations at discharge:  ? ?Follow-up PCP in 1 week for ?Check TSH in 4 to 6 weeks, TSH 0.271 ? ?Discharge Diagnoses: ?Principal Problem: ?  Sepsis ([place]) ?Active Problems: ?  Hydronephrosis ?  Pyelonephritis ?  History of endometrial cancer s/p TAH BSO [phone] ?  Hypertension ?  Obesity (BMI 30-39.9) ?  Peripheral edema ?  Diarrhea ? ?Resolved Problems: ?  * No resolved hospital problems. * ? ?Hospital Course: ?76 y.o. female with medical history significant of  Endometrial carcinoma status post hysterectomy, chronic left-sided hydronephrosis, hypertension, hyperlipidemia, constipation recurrent UTIs, chronic left leg edema with CHF, class II obesity P/w nausea vomiting diarrhea after recent UTI last week for which was treated with antibiotics, her symptoms initially improved but then developed nausea vomiting and diarrhea worsening dysuria and left-sided flank pain,suprapubic pain. She has history is complicated by chronic left ureteropelvic junction obstruction and chronic cystitis followed by urology has had few left ureteral stent exchanges and finally her stent was removed in November 2021 and she did well up until now. ?In the ED initially tachycardic leukocytosis given IV fluids CT abdomen showed evidence of chronic hydro on the left with chronic cortical scarring and atrophy obstruction apparently at the UPJ area, urology was consulted placed on IV antibiotics and admitted. ? ?Assessment and Plan: ? ?Sepsis due to UTI/complicated UTI ?-Sepsis physiology has resolved ?-Patient has chronic hydronephrosis on the left ?-Has history of cystitis/UTI ?-Chronic left ureteropelvic junction obstruction-previously managed with left ureteral stent ?-Urology saw the patient, recommend to treat  with antibiotics ?-Patient is currently on ceftriaxone, urine culture grew E. Coli; sensitive to cefazolin ?-We will discharge on Keflex 500 mg p.o. 3 times daily for 10 days ? ?  ?Diarrhea ?-No recurrence since admission ? ?  ?Hypertension ?-Blood pressure is stable ?  ?Peripheral edema/chronic diastolic CHF ?-Venous duplex of left lower extremity is negative for DVT ?  ?History of endometrial cancer ?-S/p TAH/BSO in 2013 ?  ?Hyperlipidemia ?-Continue statin ? ?Abnormal TSH ?-TSH was checked was low at 0.271 ?-Likely sick euthyroid syndrome ?-Will need recheck TSH in 4 to 6 weeks as outpatient ? ?  ? ? ?Consultants: Urology ?Procedures performed:  ?Disposition: Home ?Diet recommendation:  ?Discharge Diet Orders (From admission, onward)  ? ?  Start     Ordered  ? 07/25/21 0000  Diet - low sodium heart healthy       ? 07/25/21 1700  ? ?  ?  ? ?  ? ?Cardiac diet ?DISCHARGE MEDICATION: ?Allergies as of 07/25/2021   ? ?   Reactions  ? Cashew Nut Oil Hives  ? Dog Epithelium Hives, Itching  ? Sulfa Antibiotics Other (See Comments)  ? "blistering lips and mouth sores"  ? ?  ? ?  ?Medication List  ?  ? ?TAKE these medications   ? ?amitriptyline 25 MG tablet ?Commonly known as: ELAVIL ?Take 1 tablet (25 mg total) by mouth at bedtime. ?  ?atorvastatin 40 MG tablet ?Commonly known as: LIPITOR ?Take 1 tablet (40 mg total) by mouth daily. ?  ?cephALEXin 500 MG capsule ?Commonly known as: KEFLEX ?Take 1 capsule (500 mg total) by mouth 3 (three) times daily for 10 days. ?  ?furosemide 20 MG tablet ?Commonly known as: LASIX ?Take 1 tablet (20  mg total) by mouth daily. ?  ?gabapentin 300 MG capsule ?Commonly known as: NEURONTIN ?Take 1 capsule (300 mg total) by mouth 3 (three) times daily. ?  ?lisinopril 20 MG tablet ?Commonly known as: ZESTRIL ?Take 1 tablet (20 mg total) by mouth daily. ?  ?meloxicam 7.5 MG tablet ?Commonly known as: MOBIC ?Take 1 tablet by mouth once daily ?  ?methocarbamol 500 MG tablet ?Commonly known as:  ROBAXIN ?TAKE 1 TABLET BY MOUTH AT BEDTIME ?  ?nystatin cream ?Commonly known as: MYCOSTATIN ?Apply topically 2 (two) times daily. ?  ?oxyCODONE-acetaminophen 7.5-325 MG tablet ?Commonly known as: Percocet ?Take 1 tablet by mouth every 8 (eight) hours as needed for severe pain. ?Start taking on: August 06, 2021 ?What changed: when to take this ?  ?PreserVision AREDS Caps ?Take 1 capsule by mouth in the morning and at bedtime. ?  ? ?  ? ? ?Discharge Exam: ?Danley Danker Weights  ? 07/22/21 1518  ?Weight: 81.6 kg  ? ?General-appears in no acute distress ?Heart-S1-S2, regular, no murmur auscultated ?Lungs-clear to auscultation bilaterally, no wheezing or crackles auscultated ?Abdomen-soft, nontender, no organomegaly ?Extremities-no edema in the lower extremities ?Neuro-alert, oriented x3, no focal deficit noted ? ?Condition at discharge: good ? ?The results of significant diagnostics from this hospitalization (including imaging, microbiology, ancillary and laboratory) are listed below for reference.  ? ?Imaging Studies: ?CT ABDOMEN PELVIS W CONTRAST ? ?Result Date: 07/22/2021 ?CLINICAL DATA:  LEFT lower quadrant pain. EXAM: CT ABDOMEN AND PELVIS WITH CONTRAST TECHNIQUE: Multidetector CT imaging of the abdomen and pelvis was performed using the standard protocol following bolus administration of intravenous contrast. RADIATION DOSE REDUCTION: This exam was performed according to the departmental dose-optimization program which includes automated exposure control, adjustment of the mA and/or kV according to patient size and/or use of iterative reconstruction technique. CONTRAST:  120m OMNIPAQUE IOHEXOL 300 MG/ML  SOLN COMPARISON:  CT 11/20/2020 FINDINGS: Lower chest: Lung bases are clear. Hepatobiliary: No focal hepatic lesion. No biliary duct dilatation. Common bile duct is normal. Pancreas: Pancreas is normal. No ductal dilatation. No pancreatic inflammation. Spleen: Normal spleen Adrenals/urinary tract: Adrenal glands normal.  LEFT kidney is atrophic. Compensatory hypertrophy of the RIGHT kidney. No hydroureter. Evaluation of the bladder limited by significant streak artifact from the bilateral hip prosthetics. Stomach/Bowel: Stomach and duodenum normal. There is small bowel anastomosis in the RIGHT abdomen. Post RIGHT hemicolectomy anatomy. No evidence of bowel obstruction or inflammation. LEFT colon is normal. Vascular/Lymphatic: Abdominal aorta is normal caliber with atherosclerotic calcification. There is no retroperitoneal or periportal lymphadenopathy. No pelvic lymphadenopathy. Reproductive: Post hysterectomy.  Adnexa unremarkable Other: No abdominal hernia. Musculoskeletal: Bilateral hip prosthetics. Posterior lumbar fusion. Acute osseous findings. IMPRESSION: 1. Chronic hydronephrosis of the LEFT kidney with chronic cortical scarring and atrophy. Obstruction apparently at the UPJ level. No distal hydroureter. 2. Compensatory hypertrophy of the RIGHT kidney. No obstruction or abnormality. 3. Bladder is poorly evaluated due to streak artifact from the bilateral hip prosthetics. No gross abnormality. 4. Post RIGHT hemicolectomy anatomy. No acute inflammation or obstruction of the bowel. Electronically Signed   By: SSuzy BouchardM.D.   On: 07/22/2021 17:25  ? ?VAS [place]OWER EXTREMITY VENOUS (DVT) ? ?Result Date: 07/24/2021 ? Lower Venous DVT Study Patient Name:  [name] Date of Exam:   07/23/2021 Medical Rec #: [id number]         Accession #:    [id number] of Birth: [birth year]         Patient Gender:  F Patient Age:   8 years Exam Location:  [facility] Procedure:      VAS [place] LOWER EXTREMITY VENOUS (DVT) Referring Phys: Nyoka Lint DOUTOVA --------------------------------------------------------------------------------  Indications: Edema.  Limitations: Poor ultrasound/tissue interface. Comparison Study: No previous exams Performing Technologist: Jody Hill RVT, RDMS  Examination Guidelines: A complete  evaluation includes B-mode imaging, spectral Doppler, color Doppler, and power Doppler as needed of all accessible portions of each vessel. Bilateral testing is considered an integral part of a complete examination.

## 2021-07-26 ENCOUNTER — Telehealth: Payer: Self-pay | Admitting: Nurse Practitioner

## 2021-07-26 NOTE — Telephone Encounter (Signed)
Transition Care Management Follow-up Telephone Call ?Date of discharge and from where: 07/25/21 - [place] - sepsis due to UTI ?How have you been since you were released from the hospital? Okay, just weak ?Any questions or concerns? No ? ?Items Reviewed: ?Did the pt receive and understand the discharge instructions provided? Yes  ?Medications obtained and verified? Yes  ?Other? No  ?Any new allergies since your discharge? No  ?Dietary orders reviewed? Yes ?Do you have support at home? Yes  ? ?Home Care and Equipment/Supplies: ?Were home health services ordered? no ? ?Were any new equipment or medical supplies ordered?  No ? ?Functional Questionnaire: (I = Independent and D = Dependent) ?ADLs: I ? ?Bathing/Dressing- I ? ?Meal Prep- I ? ?Eating- I ? ?Maintaining continence- I ? ?Transferring/Ambulation- I ? ?Managing Meds- I ? ?Follow up appointments reviewed: ? ?PCP Hospital f/u appt confirmed? Yes  Scheduled to see Chevis Pretty, NP on 07/29/21 @ 11:05. ?[facility] f/u appt confirmed? No   ?Are transportation arrangements needed? No  ?If their condition worsens, is the pt aware to call PCP or go to the Emergency Dept.? Yes ?Was the patient provided with contact information for the PCP's office or ED? Yes ?Was to pt encouraged to call back with questions or concerns? Yes  ?

## 2021-07-27 LAB — CULTURE, BLOOD (ROUTINE X 2)
Culture: NO GROWTH
Culture: NO GROWTH
Special Requests: ADEQUATE

## 2021-07-29 ENCOUNTER — Encounter: Payer: Self-pay | Admitting: Nurse Practitioner

## 2021-07-29 ENCOUNTER — Ambulatory Visit (INDEPENDENT_AMBULATORY_CARE_PROVIDER_SITE_OTHER): Payer: Medicare HMO | Admitting: Nurse Practitioner

## 2021-07-29 VITALS — BP 114/63 | HR 50 | Temp 98.2°F | Resp 20 | Ht 60.0 in | Wt 189.0 lb

## 2021-07-29 DIAGNOSIS — B001 Herpesviral vesicular dermatitis: Secondary | ICD-10-CM | POA: Diagnosis not present

## 2021-07-29 DIAGNOSIS — A419 Sepsis, unspecified organism: Secondary | ICD-10-CM | POA: Diagnosis not present

## 2021-07-29 DIAGNOSIS — Z7689 Persons encountering health services in other specified circumstances: Secondary | ICD-10-CM

## 2021-07-29 DIAGNOSIS — N390 Urinary tract infection, site not specified: Secondary | ICD-10-CM

## 2021-07-29 NOTE — Progress Notes (Signed)
? ?  Subjective:  ? ? Patient ID: [name], female    DOB: June 12, 1945, 76 y.o.   MRN: [id number] ? ?Chief Complaint: Transitions Of Care (Thinks she is allergic to the keflex. Her lips is breaking out) ? ? ?Today's visit was for Transitional Care Management. ? ?The patient was discharged from [facility] on 07/25/21 with a primary diagnosis of uti with sepsis.  ? ?Contact with the patient and/or caregiver, by a clinical staff member, was made on 07/26/21 and was documented as a telephone encounter within the EMR. ? ?Through chart review and discussion with the patient I have determined that management of their condition is of moderate complexity.  ? ? ?Patient is doing well. No urinary symptoms. She was discharged home on keflex. She has had her lip swelling but she is still taking meds.  ? ? ? ? ? ?Review of Systems  ?Constitutional:  Negative for diaphoresis.  ?Eyes:  Negative for pain.  ?Respiratory:  Negative for shortness of breath.   ?Cardiovascular:  Negative for chest pain, palpitations and leg swelling.  ?Gastrointestinal:  Negative for abdominal pain.  ?Endocrine: Negative for polydipsia.  ?Skin:  Negative for rash.  ?Neurological:  Negative for dizziness, weakness and headaches.  ?Hematological:  Does not bruise/bleed easily.  ?All other systems reviewed and are negative. ? ?   ?Objective:  ? Physical Exam ?Constitutional:   ?   Appearance: Normal appearance. She is obese.  ?HENT:  ?   Mouth/Throat:  ?   Comments: Center of bottom lip has small drying up lesion with edema spreading to the left lower lip. ?Cardiovascular:  ?   Rate and Rhythm: Normal rate and regular rhythm.  ?   Heart sounds: Normal heart sounds.  ?Pulmonary:  ?   Breath sounds: Normal breath sounds.  ?Abdominal:  ?   General: Abdomen is flat. Bowel sounds are normal.  ?   Tenderness: There is no right CVA tenderness or left CVA tenderness.  ?Skin: ?   General: Skin is warm.  ?Neurological:  ?   General: No focal deficit present.  ?    Mental Status: She is alert and oriented to person, place, and time.  ?Psychiatric:     ?   Mood and Affect: Mood normal.     ?   Behavior: Behavior normal.  ? ?BP 114/63   Pulse (!) 50   Temp 98.2 ?F (36.8 ?C) (Temporal)   Resp 20   Ht 5' (1.524 m)   Wt 189 lb (85.7 kg)   SpO2 95%   BMI 36.91 kg/m?  ? ? ? ? ?   ?Assessment & Plan:  ?[name] in today with chief complaint of Transitions Of Care (Thinks she is allergic to the keflex. Her lips is breaking out) ? ? ?1. Encounter for support and coordination of transition of care ?hospital records reviewed ? ?2. Sepsis secondary to UTI [facility]) ?Continue keflex until all gone ? ?3. Fever blister ?OTC treatment  ?Not allergic reaction from keflex ? ? ? ?The above assessment and management plan was discussed with the patient. The patient verbalized understanding of and has agreed to the management plan. Patient is aware to call the clinic if symptoms persist or worsen. Patient is aware when to return to the clinic for a follow-up visit. Patient educated on when it is appropriate to go to the emergency department.  ? ?Mary-Margaret Hassell Done, FNP ? ? ? ?

## 2021-07-29 NOTE — Patient Instructions (Signed)
Cold Sore ?A cold sore, also called a fever blister, is a small, fluid-filled sore that forms inside the mouth or on the lips, gums, nose, chin, or cheeks. Cold sores can spread to other parts of the body, such as the eyes or fingers. In some people who have other medical conditions, cold sores can spread to multiple other body sites, including the genitals. ?Cold sores can spread from person to person (are contagious) until the sores crust over completely. Most cold sores go away within 2 weeks. ?What are the causes? ?Cold sores are caused by an infection from a common type of herpes simplex virus (HSV-1). HSV-1 is closely related to the HSV-2virus, which is the virus that causes genital herpes, but these viruses are not the same. Once a person is infected with HSV-1, the virus remains permanently in the body. ?HSV-1 is spread from person to person through close contact, such as through kissing, touching the affected area, or sharing personal items such as lip balm, razors, a drinking glass, or eating utensils. ?What increases the risk? ?You are more likely to develop this condition if you: ?Are tired, stressed, or sick. ?Are menstruating. ?Are pregnant. ?Take certain medicines. ?Are exposed to cold weather or too much sun. ?What are the signs or symptoms? ?Symptoms of a cold sore outbreak go through different stages. These are the stages of a cold sore: ?Tingling, itching, or burning is felt 1-2 days before the outbreak. ?Fluid-filled blisters appear on the lips, inside the mouth, on the nose, or on the cheeks. ?The blisters start to ooze clear fluid. ?The blisters dry up, and a yellow crust appears in their place. ?The crust falls off. ?In some cases, other symptoms can develop during a cold sore outbreak. These can include: ?Fever. ?Sore throat. ?Headache. ?Muscle aches. ?Swollen neck glands. ?How is this diagnosed? ?This condition is diagnosed based on your medical history and a physical exam. Your health care  provider may do a blood test or may swab some fluid from your sore and then examine the swab in the lab. ?How is this treated? ?There is no cure for cold sores or HSV-1. There is also no vaccine for HSV-1. Most cold sores go away on their own without treatment within 2 weeks. Medicines cannot make the infection go away, but your health care provider may prescribe medicines to: ?Help relieve some of the pain associated with the sores. ?Work to stop the virus from multiplying. ?Shorten healing time. ?Medicines may be in the form of creams, gels, pills, or a shot. ?Follow these instructions at home: ?Medicines ?Take or apply over-the-counter and prescription medicines only as told by your health care provider. ?Use a cotton-tip swab to apply creams or gels to your sores. ?Ask your health care provider if you can take lysine supplements. Research has found that lysine may help heal the cold sore faster and prevent outbreaks. ?Sore care ? ?Do not touch the sores or pick the scabs. ?Wash your hands often. Do not touch your eyes without washing your hands first. ?Keep the sores clean and dry. ?If directed, apply ice to the sores: ?Put ice in a plastic bag. ?Place a towel between your skin and the bag. ?Leave the ice on for 20 minutes, 2-3 times a day. ?Eating and drinking ?Eat a soft, bland diet. Avoid eating hot, cold, or salty foods. ?Use a straw if it hurts to drink out of a glass. ?Eat foods that are rich in lysine, such as meat, fish, and dairy  products. ?Avoid sugary foods, chocolates, nuts, and grains. These foods are rich in a nutrient called arginine, which can cause the virus to multiply. ?Lifestyle ?Do not kiss, have oral sex, or share personal items until your sores heal. ?Stress, poor sleep, and being out in the sun can trigger outbreaks. Make sure you: ?Do activities that help you relax, such as deep breathing exercises or meditation. ?Get enough sleep. ?Apply sunscreen on your lips before you go out in the  sun. ?Contact a health care provider if: ?You have symptoms for more than 2 weeks. ?You have pus coming from the sores. ?You have redness that is spreading. ?You have pain or irritation in your eye. ?You get sores on your genitals. ?Your sores do not heal within 2 weeks. ?You have frequent cold sore outbreaks. ?Get help right away if you have: ?A fever and your symptoms suddenly get worse. ?A headache and confusion. ?Fatigue or loss of appetite. ?A stiff neck or sensitivity to light. ?Summary ?A cold sore, also called a fever blister, is a small, fluid-filled sore that forms inside the mouth or on the lips, gums, nose, chin, or cheeks. ?Most cold sores go away on their own without treatment within 2 weeks. Your health care provider may prescribe medicines to help relieve some of the pain, work to stop the virus from multiplying, and shorten healing time. ?Wash your hands often. Do not touch your eyes without washing your hands first. ?Do not kiss, have oral sex, or share personal items until your sores heal. ?Contact a health care provider if your sores do not heal within 2 weeks. ?This information is not intended to replace advice given to you by your health care provider. Make sure you discuss any questions you have with your health care provider. ?Document Revised: 10/29/2020 Document Reviewed: 09/25/2017 ?Elsevier Patient Education ? [employer]. ? ?

## 2021-08-05 ENCOUNTER — Other Ambulatory Visit: Payer: Self-pay | Admitting: Nurse Practitioner

## 2021-08-31 ENCOUNTER — Ambulatory Visit (INDEPENDENT_AMBULATORY_CARE_PROVIDER_SITE_OTHER): Payer: Medicare HMO | Admitting: Nurse Practitioner

## 2021-08-31 ENCOUNTER — Encounter: Payer: Self-pay | Admitting: Nurse Practitioner

## 2021-08-31 VITALS — BP 142/73 | HR 69 | Temp 98.0°F | Resp 20 | Ht 60.0 in | Wt 189.0 lb

## 2021-08-31 DIAGNOSIS — J3489 Other specified disorders of nose and nasal sinuses: Secondary | ICD-10-CM

## 2021-08-31 MED ORDER — MUPIROCIN 2 % EX OINT: 1.0000 "application " | TOPICAL_OINTMENT | Freq: Two times a day (BID) | CUTANEOUS | 0 refills | Status: DC

## 2021-08-31 NOTE — Progress Notes (Signed)
? ?Subjective:  ? ? Patient ID: [name], female    DOB: [birth year], 76 y.o.   MRN: [id number] ? ? ?Chief Complaint: Red area swollen on nose ? ? ?HPI ?Patient comes in c/o red raised lesion on the tip of her nose. She noticed it on Sunday and it has gotten bigger and is sore to the touch. No drainage. Feels like  a sore place up in her right nares. ? ? ? ?Review of Systems  ?Constitutional:  Negative for diaphoresis.  ?Eyes:  Negative for pain.  ?Respiratory:  Negative for shortness of breath.   ?Cardiovascular:  Negative for chest pain, palpitations and leg swelling.  ?Gastrointestinal:  Negative for abdominal pain.  ?Endocrine: Negative for polydipsia.  ?Skin:  Negative for rash.  ?Neurological:  Negative for dizziness, weakness and headaches.  ?Hematological:  Does not bruise/bleed easily.  ?All other systems reviewed and are negative. ? ?   ?Objective:  ? Physical Exam ?Vitals and nursing note reviewed.  ?Constitutional:   ?   General: She is not in acute distress. ?   Appearance: Normal appearance. She is well-developed.  ?HENT:  ?   Head: Normocephalic.  ?   Right Ear: Tympanic membrane normal.  ?   Left Ear: Tympanic membrane normal.  ?   Nose: Nose normal.  ?   Comments: Erythematous sore lesion in tip right side of nose ?   Mouth/Throat:  ?   Mouth: Mucous membranes are moist.  ?Eyes:  ?   Pupils: Pupils are equal, round, and reactive to light.  ?Neck:  ?   Vascular: No carotid bruit or JVD.  ?Cardiovascular:  ?   Rate and Rhythm: Normal rate and regular rhythm.  ?   Heart sounds: Normal heart sounds.  ?Pulmonary:  ?   Effort: Pulmonary effort is normal. No respiratory distress.  ?   Breath sounds: Normal breath sounds. No wheezing or rales.  ?Chest:  ?   Chest wall: No tenderness.  ?Abdominal:  ?   General: Bowel sounds are normal. There is no distension or abdominal bruit.  ?   Palpations: Abdomen is soft. There is no hepatomegaly, splenomegaly, mass or pulsatile mass.  ?   Tenderness: There is  no abdominal tenderness.  ?Musculoskeletal:     ?   General: Normal range of motion.  ?   Cervical back: Normal range of motion and neck supple.  ?Lymphadenopathy:  ?   Cervical: No cervical adenopathy.  ?Skin: ?   General: Skin is warm and dry.  ?Neurological:  ?   Mental Status: She is alert and oriented to person, place, and time.  ?   Deep Tendon Reflexes: Reflexes are normal and symmetric.  ?Psychiatric:     ?   Behavior: Behavior normal.     ?   Thought Content: Thought content normal.     ?   Judgment: Judgment normal.  ? ?BP (!) 142/73   Pulse 69   Temp 98 ?F (36.7 ?C) (Temporal)   Resp 20   Ht 5' (1.524 m)   Wt 189 lb (85.7 kg)   SpO2 96%   BMI 36.91 kg/m?  ? ? ? ? ?   ?Assessment & Plan:  ? ?Clearence Ped Sitter in today with chief complaint of Red area swollen on nose ? ? ?1. Internal nasal lesion ?Avoid rubbing or picking ?Meds ordered this encounter  ?Medications  ? mupirocin ointment (BACTROBAN) 2 %  ?  Sig: Apply 1 application. topically 2 (  two) times daily.  ?  Dispense:  22 g  ?  Refill:  0  ?  Order Specific Question:   Supervising Provider  ?  Answer:   Caryl Pina A [[id number]]  ? ? ? ? ? ?The above assessment and management plan was discussed with the patient. The patient verbalized understanding of and has agreed to the management plan. Patient is aware to call the clinic if symptoms persist or worsen. Patient is aware when to return to the clinic for a follow-up visit. Patient educated on when it is appropriate to go to the emergency department.  ? ?Mary-Margaret Hassell Done, FNP ? ? ?

## 2021-09-03 ENCOUNTER — Ambulatory Visit (INDEPENDENT_AMBULATORY_CARE_PROVIDER_SITE_OTHER): Payer: Medicare HMO | Admitting: Nurse Practitioner

## 2021-09-03 ENCOUNTER — Encounter: Payer: Self-pay | Admitting: Nurse Practitioner

## 2021-09-03 VITALS — BP 130/68 | HR 65 | Temp 98.2°F | Resp 20 | Ht 60.0 in | Wt 189.0 lb

## 2021-09-03 DIAGNOSIS — S32009K Unspecified fracture of unspecified lumbar vertebra, subsequent encounter for fracture with nonunion: Secondary | ICD-10-CM | POA: Diagnosis not present

## 2021-09-03 DIAGNOSIS — G8929 Other chronic pain: Secondary | ICD-10-CM | POA: Diagnosis not present

## 2021-09-03 DIAGNOSIS — M546 Pain in thoracic spine: Secondary | ICD-10-CM | POA: Diagnosis not present

## 2021-09-03 DIAGNOSIS — R7989 Other specified abnormal findings of blood chemistry: Secondary | ICD-10-CM | POA: Diagnosis not present

## 2021-09-03 MED ORDER — OXYCODONE-ACETAMINOPHEN 7.5-325 MG PO TABS: 1.0000 | ORAL_TABLET | Freq: Three times a day (TID) | ORAL | 0 refills | Status: DC | PRN

## 2021-09-03 MED ORDER — OXYCODONE-ACETAMINOPHEN 7.5-325 MG PO TABS: 1.0000 | ORAL_TABLET | Freq: Three times a day (TID) | ORAL | 0 refills | Status: AC | PRN

## 2021-09-03 NOTE — Addendum Note (Signed)
Addended by: Chevis Pretty on: 09/03/2021 11:57 AM ? ? Modules accepted: Orders ? ?

## 2021-09-03 NOTE — Progress Notes (Signed)
? ?Subjective:  ? ? Patient ID: [name], female    DOB: [birth year], 76 y.o.   MRN: [id number] ? ? ?Chief Complaint: Pain Management ?  ? ?HPI: ?[name] is a 76 y.o. who identifies as a female who was assigned female at birth.  ? ?Pseudoarthritis L3-L5 ? ?Pain assessment: ?Cause of pain- psuedoarthritis ?Pain location- lower back ?Pain on scale of 1-10- 4/10 currently ?Frequency- daily ?What increases pain-standing ?What makes pain Better-sitting ?Effects on ADL - none ?Any change in general medical condition-no ? ?Current opioids rx- percocet 7.5/325 3x a day ?# meds rx- 39 ?Effectiveness of current meds-helsp ?Adverse reactions from pain meds-none ?Morphine equivalent- 33.75 MME ? ?Pill count performed-No ?Last drug screen - 03/09/21 ?( high risk q26m moderate risk q642mlow risk yearly ) ?Urine drug screen today- No ?[place]- yes ? If yes were their any concerning findings? - no ? ? ?Overdose risk: 1 ? ?  09/15/2020  ? 10:41 AM  ?Opioid Risk   ?Alcohol 0  ?Illegal Drugs 0  ?Rx Drugs 0  ?Alcohol 0  ?Illegal Drugs 0  ?Rx Drugs 0  ?Age between 1639-45ears  0  ?History of Preadolescent Sexual Abuse 0  ?Psychological Disease 0  ?Depression 0  ?Opioid Risk Tool Scoring 0  ?Opioid Risk Interpretation Low Risk  ? ? ? ?Pain contract signed on:03/10/21 ? ? ? ?Social history: ?Lives with: by herself ?Work history: retired ? ? ?Comes in today for follow up of the following chronic medical issues: ? ?There are no diagnoses linked to this encounter. ? ?New complaints: ?none ? ?Allergies  ?Allergen Reactions  ? Cashew Nut Oil Hives  ? Dog Epithelium Hives and Itching  ? Sulfa Antibiotics Other (See Comments)  ?  "blistering lips and mouth sores"  ? ?Outpatient Encounter Medications as of 09/03/2021  ?Medication Sig  ? amitriptyline (ELAVIL) 25 MG tablet Take 1 tablet (25 mg total) by mouth at bedtime.  ? atorvastatin (LIPITOR) 40 MG tablet Take 1 tablet (40 mg total) by mouth daily.  ? furosemide  (LASIX) 20 MG tablet Take 1 tablet (20 mg total) by mouth daily.  ? gabapentin (NEURONTIN) 300 MG capsule Take 1 capsule (300 mg total) by mouth 3 (three) times daily.  ? lisinopril (ZESTRIL) 20 MG tablet Take 1 tablet (20 mg total) by mouth daily.  ? meloxicam (MOBIC) 7.5 MG tablet Take 1 tablet by mouth once daily (Patient taking differently: Take 7.5 mg by mouth daily.)  ? methocarbamol (ROBAXIN) 500 MG tablet TAKE 1 TABLET BY MOUTH AT BEDTIME  ? Multiple Vitamins-Minerals (PRESERVISION AREDS) CAPS Take 1 capsule by mouth in the morning and at bedtime.  ? mupirocin ointment (BACTROBAN) 2 % Apply 1 application. topically 2 (two) times daily.  ? nystatin cream (MYCOSTATIN) Apply topically 2 (two) times daily.  ? oxyCODONE-acetaminophen (PERCOCET) 7.5-325 MG tablet Take 1 tablet by mouth every 8 (eight) hours as needed for severe pain. (Patient taking differently: Take 1 tablet by mouth 3 (three) times daily as needed for severe pain.)  ? ?No facility-administered encounter medications on file as of 09/03/2021.  ? ? ?Past Surgical History:  ?Procedure Laterality Date  ? ABDOMINAL HYSTERECTOMY  11/02/10  dr geAlycia Rossetti'@[facility]   ? TAH, BSO, incisional hernia repair , lysis of adhesions for endometrial cancer  ? ANTERIOR LUMBAR FUSION  01-28-2008   dr elsner  '@[facility]'$   ? L2 -- L4  ? BACK SURGERY    ? BUNIONECTOMY Right 2003  ?  CATARACT EXTRACTION W/ INTRAOCULAR LENS  IMPLANT, BILATERAL  2007  ? COLONOSCOPY  last one 12-24-2018  ? CYSTO/  LEFT RETROGRADE PYELOGRAM/ URETEROSCOPY STENT PLACEMENT/  OPEN URETEROLYSIS WITH OMENTAL FLAP Left 03-11-2009    dr borden  '@[facility]'$   ? CYSTO/  [facility] SLING/  ANTERIOR REPAIR  12-06-2001    dr Jeffie Pollock '@[facility]'$   ? CYSTOSCOPY W/ URETERAL STENT PLACEMENT Left 12/07/2017  ? Procedure: CYSTOSCOPY WITH RETROGRADE PYELOGRAM/URETERAL STENT PLACEMENT;  Surgeon: Raynelle Bring, MD;  Location: [facility];  Service: Urology;  Laterality: Left;  ? CYSTOSCOPY W/ URETERAL STENT PLACEMENT Left 03/29/2018  ? Procedure: CYSTOSCOPY  WITH STENT  EXCHANGE;  Surgeon: Raynelle Bring, MD;  Location: [facility];  Service: Urology;  Laterality: Left;  ? CYSTOSCOPY WITH RETROGRADE PYELOGRAM, URETEROSCOPY AND STENT PLACEMENT Left 12-29-2008   dr Alinda Money  '@[facility]'$   ? WITH BALLOON DILATION LEFT URETERAL STRICTURE  ? CYSTOSCOPY WITH RETROGRADE PYELOGRAM, URETEROSCOPY AND STENT PLACEMENT Left 09-12-2008    dr Jeffie Pollock '@[facility]   ? CYSTOSCOPY WITH RETROGRADE PYELOGRAM, URETEROSCOPY AND STENT PLACEMENT Left 03/23/2020  ? Procedure: CYSTOSCOPY WITH LEFT RETROGRADE PYELOGRAM, AND LEFT STENT REMOVAL;  Surgeon: Raynelle Bring, MD;  Location: [facility];  Service: Urology;  Laterality: Left;  ? CYSTOSCOPY WITH STENT PLACEMENT Left 07/26/2018  ? Procedure: CYSTOSCOPY WITH STENT CHANGE;  Surgeon: Raynelle Bring, MD;  Location: [facility];  Service: Urology;  Laterality: Left;  ? CYSTOSCOPY WITH STENT PLACEMENT Left 12/03/2018  ? Procedure: CYSTOSCOPY WITH STENT CHANGE;  Surgeon: Raynelle Bring, MD;  Location: [facility];  Service: Urology;  Laterality: Left;  ? CYSTOSCOPY WITH STENT PLACEMENT Left 04/22/2019  ? Procedure: CYSTOSCOPY WITH STENT CHANGE;  Surgeon: Raynelle Bring, MD;  Location: [facility];  Service: Urology;  Laterality: Left;  ? CYSTOSCOPY WITH STENT PLACEMENT Left 10/03/2019  ? Procedure: CYSTOSCOPY WITH STENT CHANGE;  Surgeon: Raynelle Bring, MD;  Location: [facility];  Service: Urology;  Laterality: Left;  ? EYE SURGERY    ? FOOT SURGERY Left 06-23-2010   dr Beola Cord  ? left first and second toes  ? INSERTION OF MESH N/A 04/23/2013  ? Procedure: INSERTION OF MESH;  Surgeon: Adin Hector, MD;  Location: [facility];  Service: General;  Laterality: N/A;  ? JOINT REPLACEMENT    ? KNEE ARTHROSCOPY Right 04-27-2007  dr Shellia Carwin '@[facility]'$   ? LAPAROSCOPIC LYSIS OF ADHESIONS N/A 04/23/2013  ? Procedure: LAPAROSCOPIC LYSIS OF ADHESIONS;  Surgeon: Adin Hector, MD;  Location: [facility];  Service: General;  Laterality: N/A;  ? [place] and 1999  ? POSTERIOR LUMBAR  FUSION  12/17/2012  ? L3 -- L5 laminectomy and L3 -- 5 fusion  ? TOTAL HIP ARTHROPLASTY Left 07/04/2014  ? Procedure: LEFT TOTAL HIP ARTHROPLASTY ANTERIOR APPROACH;  Surgeon: Gearlean Alf, MD;  Location: [facility];  Service: Orthopedics;  Laterality: Left;  ? TOTAL HIP ARTHROPLASTY Right 2009  ? TOTAL KNEE ARTHROPLASTY Right 09-15-2009   dr Shellia Carwin '@[facility]'$   ? VENTRAL HERNIA REPAIR N/A 07/19/2012  ? Procedure: LAPAROSCOPIC LYSIS OF ADHESIONS, SMALL BOWEL RESECTION, SEROSAL REPAIR, PRIMARY VENTRAL HERNIA REPAIR;  Surgeon: Adin Hector, MD;  Location: [facility];  Service: General;  Laterality: N/A;  ? VENTRAL HERNIA REPAIR N/A 04/23/2013  ? Procedure: LAPAROSCOPIC exploration and repair of hernia in abdominal VENTRAL wall  HERNIA;  Surgeon: Adin Hector, MD;  Location: [facility];  Service: General;  Laterality: N/A;  ? ? ?Family History  ?Problem Relation Age of Onset  ? Diabetes Mother   ?  Lung cancer Mother   ? Diabetes Father   ? Liver cancer Father   ? Colon cancer Sister   ?     close to 65 per pt  ? Diabetes Sister   ? Diabetes Sister   ? Hypertension Sister   ? Kidney disease Sister   ? Seizures Daughter   ? Diabetes Son   ? Breast cancer Neg Hx   ? Allergic rhinitis Neg Hx   ? Angioedema Neg Hx   ? Asthma Neg Hx   ? Atopy Neg Hx   ? Eczema Neg Hx   ? Immunodeficiency Neg Hx   ? Urticaria Neg Hx   ? Colon polyps Neg Hx   ? Esophageal cancer Neg Hx   ? Rectal cancer Neg Hx   ? Stomach cancer Neg Hx   ? ? ? ? ? ?Review of Systems  ?Constitutional:  Negative for diaphoresis.  ?Eyes:  Negative for pain.  ?Respiratory:  Negative for shortness of breath.   ?Cardiovascular:  Negative for chest pain, palpitations and leg swelling.  ?Gastrointestinal:  Negative for abdominal pain.  ?Endocrine: Negative for polydipsia.  ?Musculoskeletal:  Positive for back pain.  ?Skin:  Negative for rash.  ?Neurological:  Negative for dizziness, weakness and headaches.  ?Hematological:  Does not bruise/bleed easily.  ?All other systems  reviewed and are negative. ? ?   ?Objective:  ? Physical Exam ?Vitals reviewed.  ?Constitutional:   ?   Appearance: Normal appearance.  ?Cardiovascular:  ?   Rate and Rhythm: Normal rate and regular rhythm.  ?

## 2021-09-03 NOTE — Patient Instructions (Signed)
Opioid Pain Medicine Management Opioid pain medicines are strong medicines that are used to treat bad or very bad pain. When you take them for a short time, they can help you: Sleep better. Do better in physical therapy. Feel better during the first few days after you get hurt. Recover from surgery. Only take these medicines if a doctor says that you can. You should only take them for a short time. This is because opioids can be very addictive. This means that they are hard to stop taking. The longer you take opioids, the harder it may be to stop taking them. What are the risks? Opioids can cause problems (side effects). Taking them for more than 3 days raises your chance of problems, such as: Trouble pooping (constipation). Feeling sick to your stomach (nausea). Vomiting. Feeling very sleepy. Confusion. Not being able to stop taking the medicine. Breathing problems. Taking opioids for a long time can make it hard for you to do daily tasks. It can also put you at risk for: Car accidents. Depression. Suicide. Heart attack. Taking too much of the medicine (overdose). This can lead to death. What is a pain treatment plan? A pain treatment plan is a plan made by you and your doctor. Work with your doctor to make a plan for treating your pain. To help you do this: Talk about the goals of your treatment, including: How much pain you might expect to have. How you will manage the pain. Talk about the risks and benefits of taking these medicines for your condition. Remember that a good treatment plan uses more than one approach and lowers the risks of side effects. Tell your doctor about the amount of medicines you take and about any drug or alcohol use. Get your pain medicine prescriptions from only one doctor. Pain can be managed with other treatments. Work with your doctor to find other ways to help your pain, such as: Physical therapy or doing gentle exercises. Counseling. Eating healthy  foods. Massage. Meditation. Other pain medicines. How to use opioid pain medicine safely Taking medicine Take your pain medicine exactly as told by your doctor. Take it only when you need it. If your pain is not too bad, you may take less medicine if your doctor allows. If you have no pain, do not take the medicine unless your doctor tells you to take it. If your pain is very bad, do not take more medicine than your doctor told you to take. Call your doctor to know what to do. Write down the times when you take your pain medicine. Look at the times before you take your next dose. Take other over-the-counter or prescription medicines only as told by your doctor. Keeping yourself and others safe  While you are taking opioids: Do not drive, use machines, or power tools. Do not sign important papers (legal documents). Do not drink alcohol. Do not take sleeping pills. Do not take care of children by yourself. Do not do activities where you need to climb or be in high places, like working on a ladder. Do not go to a lake, river, ocean, swimming pool, or hot tub. Keep your opioids locked up or in a place where children cannot reach them. Do not share your pain medicine with anyone. Stopping your use of opioids If you have been taking opioids for more than a few weeks, you may need to slowly decrease (taper) how much you take until you stop taking them. Doing this can lower your chance   of having symptoms.  Symptoms that come from suddenly stopping the use of opioids include: Pain and cramping in your belly (abdomen). Feeling sick to your stomach (nausea).z Sweating. Feeling very sleepy. Feeling restless. Shaking you cannot control (tremors). Cravings for the medicine. Do not try to stop taking them by yourself. Work with your doctor to stop. Your doctor will help you take less until you are not taking the medicine at all. Getting rid of unused pills Do not save any pills that you did not  use. Get rid of the pills by: Taking them to a take-back program in your area. Bringing them to a pharmacy that receives unused pills. Flushing them down the toilet. Check the label or package insert of your medicine to see whether this is safe to do. Throwing them in the trash. Check the label or package insert of your medicine to see whether this is safe to do. If it is safe to throw them out: Take the pills out of their container. Put the pills into a container you can seal. Mix the pills with used coffee grounds, food scraps, dirt, or cat litter. Put this in the trash. Follow these instructions at home: Activity Do exercises as told by your doctor. Avoid doing things that make your pain worse. Return to your normal activities as told by your doctor. Ask your doctor what activities are safe for you. General instructions You may need to take these actions to prevent or treat constipation: Drink enough fluid to keep your pee (urine) pale yellow. Take over-the-counter or prescription medicines. Eat foods that are high in fiber. These include beans, whole grains, and fresh fruits and vegetables. Limit foods that are high in fat and sugar. These include fried or sweet foods. Keep all follow-up visits. Where to find support If you have been taking opioids for a long time, get help from a local support group or counselor. Ask your doctor about this. Where to find more information Centers for Disease Control and Prevention (CDC): www.cdc.gov [place] Food and Drug Administration (FDA): www.fda.gov Get help right away if: You may have taken too much of an opioid (overdosed). Common symptoms of an overdose: Your breathing is slower or more shallow than normal. You have a very slow heartbeat. Your speech is not normal. You vomit or you feel as if you may vomit. The black centers of your eyes (pupils) are smaller than normal. You have other potential symptoms: You feel very confused. You  faint. You are very sleepy. You have cold skin. You have blue lips or fingernails. You have thoughts of harming yourself or harming others. These symptoms may be an emergency. Get help right away. Call your local emergency services (911 in the [place] Do not wait to see if the symptoms will go away. Do not drive yourself to the hospital. Get help right away if you feel like you may hurt yourself or others, or have thoughts about taking your own life. Go to your nearest emergency room or: Call your local emergency services (911 in the [place] Call the National Poison Control Center at 1-800-222-1222. Call a suicide crisis helpline, such as the National Suicide Prevention Lifeline at 1-800-273-8255 or 988 in the [place] This is open 24 hours a day. Text the Crisis Text Line at 741741. Summary Opioid are strong medicines that are used to treat bad or very bad pain. A pain treatment plan is a plan made by you and your doctor. Work with your doctor to make a   plan for treating your pain. If you think that you or someone else may have taken too much of an opioid, get help right away. This information is not intended to replace advice given to you by your health care provider. Make sure you discuss any questions you have with your health care provider. Document Revised: 11/18/2020 Document Reviewed: 08/05/2020 Elsevier Patient Education  2023 [employer] Inc[employer]  

## 2021-09-04 LAB — THYROID PANEL WITH TSH
Free Thyroxine Index: 1.8 (ref 1.2–4.9)
T3 Uptake Ratio: 26 % (ref 24–39)
T4, Total: 6.8 ug/dL (ref 4.5–12.0)
TSH: 0.988 u[IU]/mL (ref 0.450–4.500)

## 2021-09-05 ENCOUNTER — Other Ambulatory Visit: Payer: Self-pay | Admitting: Nurse Practitioner

## 2021-09-06 NOTE — Telephone Encounter (Signed)
Last OV 09/03/21. Last RF 08/06/2021 #30 no RF. Next OV 11/30/21 ?

## 2021-09-10 DIAGNOSIS — N3020 Other chronic cystitis without hematuria: Secondary | ICD-10-CM | POA: Diagnosis not present

## 2021-09-10 DIAGNOSIS — N135 Crossing vessel and stricture of ureter without hydronephrosis: Secondary | ICD-10-CM | POA: Diagnosis not present

## 2021-09-30 DIAGNOSIS — M25551 Pain in right hip: Secondary | ICD-10-CM | POA: Diagnosis not present

## 2021-09-30 DIAGNOSIS — M25552 Pain in left hip: Secondary | ICD-10-CM | POA: Diagnosis not present

## 2021-10-11 ENCOUNTER — Other Ambulatory Visit: Payer: Self-pay | Admitting: Nurse Practitioner

## 2021-10-11 DIAGNOSIS — G8929 Other chronic pain: Secondary | ICD-10-CM

## 2021-11-11 DIAGNOSIS — M7061 Trochanteric bursitis, right hip: Secondary | ICD-10-CM | POA: Diagnosis not present

## 2021-11-11 DIAGNOSIS — M7062 Trochanteric bursitis, left hip: Secondary | ICD-10-CM | POA: Diagnosis not present

## 2021-11-30 ENCOUNTER — Ambulatory Visit (INDEPENDENT_AMBULATORY_CARE_PROVIDER_SITE_OTHER): Payer: Medicare HMO | Admitting: Nurse Practitioner

## 2021-11-30 ENCOUNTER — Other Ambulatory Visit: Payer: Self-pay | Admitting: Nurse Practitioner

## 2021-11-30 ENCOUNTER — Encounter: Payer: Self-pay | Admitting: Nurse Practitioner

## 2021-11-30 VITALS — BP 138/68 | HR 88 | Temp 98.0°F | Resp 20 | Ht 60.0 in | Wt 181.0 lb

## 2021-11-30 DIAGNOSIS — I10 Essential (primary) hypertension: Secondary | ICD-10-CM

## 2021-11-30 DIAGNOSIS — E781 Pure hyperglyceridemia: Secondary | ICD-10-CM

## 2021-11-30 DIAGNOSIS — E669 Obesity, unspecified: Secondary | ICD-10-CM | POA: Diagnosis not present

## 2021-11-30 DIAGNOSIS — R609 Edema, unspecified: Secondary | ICD-10-CM

## 2021-11-30 DIAGNOSIS — R079 Chest pain, unspecified: Secondary | ICD-10-CM | POA: Diagnosis not present

## 2021-11-30 DIAGNOSIS — G629 Polyneuropathy, unspecified: Secondary | ICD-10-CM

## 2021-11-30 DIAGNOSIS — M546 Pain in thoracic spine: Secondary | ICD-10-CM

## 2021-11-30 DIAGNOSIS — Z23 Encounter for immunization: Secondary | ICD-10-CM

## 2021-11-30 DIAGNOSIS — R69 Illness, unspecified: Secondary | ICD-10-CM | POA: Diagnosis not present

## 2021-11-30 DIAGNOSIS — G8929 Other chronic pain: Secondary | ICD-10-CM

## 2021-11-30 DIAGNOSIS — F3341 Major depressive disorder, recurrent, in partial remission: Secondary | ICD-10-CM

## 2021-11-30 DIAGNOSIS — R600 Localized edema: Secondary | ICD-10-CM

## 2021-11-30 MED ORDER — OXYCODONE-ACETAMINOPHEN 7.5-325 MG PO TABS: 1.0000 | ORAL_TABLET | Freq: Three times a day (TID) | ORAL | 0 refills | Status: DC | PRN

## 2021-11-30 MED ORDER — AMITRIPTYLINE HCL 25 MG PO TABS: 25.0000 mg | ORAL_TABLET | Freq: Every day | ORAL | 1 refills | Status: DC

## 2021-11-30 MED ORDER — FUROSEMIDE 20 MG PO TABS: 20.0000 mg | ORAL_TABLET | Freq: Every day | ORAL | 1 refills | Status: DC

## 2021-11-30 MED ORDER — LISINOPRIL 20 MG PO TABS: 20.0000 mg | ORAL_TABLET | Freq: Every day | ORAL | 1 refills | Status: DC

## 2021-11-30 NOTE — Patient Instructions (Signed)
   Chest Wall Pain Chest wall pain is pain in or around the bones and muscles of your chest. Chest wall pain may be caused by: An injury. Coughing a lot. Using your chest and arm muscles too much. Sometimes, the cause may not be known. This pain may take a few weeks or longer to get better. Follow these instructions at home: Managing pain, stiffness, and swelling If told, put ice on the painful area: Put ice in a plastic bag. Place a towel between your skin and the bag. Leave the ice on for 20 minutes, 2-3 times a day.  Activity Rest as told by your doctor. Avoid doing things that cause pain. This includes lifting heavy items. Ask your doctor what activities are safe for you. General instructions  Take over-the-counter and prescription medicines only as told by your doctor. Do not use any products that contain nicotine or tobacco, such as cigarettes, e-cigarettes, and chewing tobacco. If you need help quitting, ask your doctor. Keep all follow-up visits as told by your doctor. This is important. Contact a doctor if: You have a fever. Your chest pain gets worse. You have new symptoms. Get help right away if: You feel sick to your stomach (nauseous) or you throw up (vomit). You feel sweaty or light-headed. You have a cough with mucus from your lungs (sputum) or you cough up blood. You are short of breath. These symptoms may be an emergency. Do not wait to see if the symptoms will go away. Get medical help right away. Call your local emergency services (911 in the [place] Do not drive yourself to the hospital. Summary Chest wall pain is pain in or around the bones and muscles of your chest. It may be treated with ice, rest, and medicines. Your condition may also get better if you avoid doing things that cause pain. Contact a doctor if you have a fever, chest pain that gets worse, or new symptoms. Get help right away if you feel light-headed or you get short of breath. These symptoms  may be an emergency. This information is not intended to replace advice given to you by your health care provider. Make sure you discuss any questions you have with your health care provider. Document Revised: 07/28/2020 Document Reviewed: 07/10/2020 Elsevier Patient Education  2023 [employer]  

## 2021-11-30 NOTE — Progress Notes (Signed)
Subjective:    Patient ID: [name], female    DOB: 1945-06-07, 76 y.o.   MRN: [id number]   Chief Complaint: Medical Management of Chronic Issues (Feels like heart is beating fast at times)    HPI:  [name] is a 76 y.o. who identifies as a female who was assigned female at birth.   Social history: Lives with: with her daughters family Work history: retired   Scientist, forensic in today for follow up of the following chronic medical issues:  1. Primary hypertension No c/o headaches. She has bene having chest pain and SOB . Feels like her heart is racing. Has been going on for a month. Happens several times a week. She is usually doing something when it occurs and she will stop and sit down. Takes several minutes to resolve. Rates pain 6/10 when it occurs. Says heavy pain.  2. Pure hypertriglyceridemia Does try to watch diet but is not very active  3. Peripheral edema Left foot swells daily  4. Peripheral polyneuropathy Feet feel like they are burning on the bottom.  5. Recurrent major depressive disorder, in partial remission ([place]) Is not on an antidepressant currently.    11/30/2021    9:59 AM 09/03/2021   11:36 AM 08/31/2021    9:54 AM  Depression screen PHQ 2/9  Decreased Interest 0 0 0  Down, Depressed, Hopeless 0 0 0  PHQ - 2 Score 0 0 0  Altered sleeping 0 0 0  Tired, decreased energy 1 1 0  Change in appetite 0 0 0  Feeling bad or failure about yourself  0 0 0  Trouble concentrating 0 0 0  Moving slowly or fidgety/restless 0 0 0  Suicidal thoughts 0 0 0  PHQ-9 Score 1 1 0  Difficult doing work/chores Not difficult at all Not difficult at all Not difficult at all     6. Obesity (BMI 30-39.9) No recent weight changes Wt Readings from Last 3 Encounters:  11/30/21 181 lb (82.1 kg)  09/03/21 189 lb (85.7 kg)  08/31/21 189 lb (85.7 kg)   BMI Readings from Last 3 Encounters:  11/30/21 35.35 kg/m  09/03/21 36.91 kg/m  08/31/21 36.91 kg/m       New complaints: Chest pain as stated above  Allergies  Allergen Reactions   Cashew Nut Oil Hives   Dog Epithelium Hives and Itching   Sulfa Antibiotics Other (See Comments)    "blistering lips and mouth sores"   Outpatient Encounter Medications as of 11/30/2021  Medication Sig   amitriptyline (ELAVIL) 25 MG tablet Take 1 tablet (25 mg total) by mouth at bedtime.   furosemide (LASIX) 20 MG tablet Take 1 tablet (20 mg total) by mouth daily.   lisinopril (ZESTRIL) 20 MG tablet Take 1 tablet (20 mg total) by mouth daily.   meloxicam (MOBIC) 7.5 MG tablet Take 1 tablet by mouth once daily   methocarbamol (ROBAXIN) 500 MG tablet TAKE 1 TABLET BY MOUTH AT BEDTIME   Multiple Vitamins-Minerals (PRESERVISION AREDS) CAPS Take 1 capsule by mouth in the morning and at bedtime.   mupirocin ointment (BACTROBAN) 2 % Apply 1 application. topically 2 (two) times daily.   nystatin cream (MYCOSTATIN) Apply topically 2 (two) times daily.   oxyCODONE-acetaminophen (PERCOCET) 7.5-325 MG tablet Take 1 tablet by mouth 3 (three) times daily as needed for severe pain.   [DISCONTINUED] atorvastatin (LIPITOR) 40 MG tablet Take 1 tablet (40 mg total) by mouth daily.   [DISCONTINUED] gabapentin (NEURONTIN) 300  MG capsule Take 1 capsule (300 mg total) by mouth 3 (three) times daily.   No facility-administered encounter medications on file as of 11/30/2021.    Past Surgical History:  Procedure Laterality Date   ABDOMINAL HYSTERECTOMY  11/02/10  dr Alycia Rossetti  _0    TAH, BSO, incisional hernia repair , lysis of adhesions for endometrial cancer   ANTERIOR LUMBAR FUSION  01-28-2008   dr elsner  _1    L2 -- L4   BACK SURGERY     BUNIONECTOMY Right 2003   CATARACT EXTRACTION W/ INTRAOCULAR LENS  IMPLANT, BILATERAL  2007   COLONOSCOPY  last one 12-24-2018   CYSTO/  LEFT RETROGRADE PYELOGRAM/ URETEROSCOPY STENT PLACEMENT/  OPEN URETEROLYSIS WITH OMENTAL FLAP Left 03-11-2009    dr borden  _2    CYSTO/  [facility]  [facility]/  ANTERIOR REPAIR  12-06-2001    dr Jeffie Pollock _3    CYSTOSCOPY W/ URETERAL STENT PLACEMENT Left 12/07/2017   Procedure: CYSTOSCOPY WITH RETROGRADE PYELOGRAM/URETERAL STENT PLACEMENT;  Surgeon: Raynelle Bring, MD;  Location: [facility];  Service: Urology;  Laterality: Left;   CYSTOSCOPY W/ URETERAL STENT PLACEMENT Left 03/29/2018   Procedure: CYSTOSCOPY WITH STENT  EXCHANGE;  Surgeon: Raynelle Bring, MD;  Location: [facility];  Service: Urology;  Laterality: Left;   CYSTOSCOPY WITH RETROGRADE PYELOGRAM, URETEROSCOPY AND STENT PLACEMENT Left 12-29-2008   dr Alinda Money  _4    WITH BALLOON DILATION LEFT URETERAL STRICTURE   CYSTOSCOPY WITH RETROGRADE PYELOGRAM, URETEROSCOPY AND STENT PLACEMENT Left 09-12-2008    dr Jeffie Pollock _5    CYSTOSCOPY WITH RETROGRADE PYELOGRAM, URETEROSCOPY AND STENT PLACEMENT Left 03/23/2020   Procedure: CYSTOSCOPY WITH LEFT RETROGRADE PYELOGRAM, AND LEFT STENT REMOVAL;  Surgeon: Raynelle Bring, MD;  Location: [facility];  Service: Urology;  Laterality: Left;   CYSTOSCOPY WITH STENT PLACEMENT Left 07/26/2018   Procedure: CYSTOSCOPY WITH STENT CHANGE;  Surgeon: Raynelle Bring, MD;  [facility];  Service: Urology;  Laterality: Left;   CYSTOSCOPY WITH STENT PLACEMENT Left 12/03/2018   Procedure: CYSTOSCOPY WITH STENT CHANGE;  Surgeon: Raynelle Bring, MD;  [facility]: [facility];  Service: Urology;  Laterality: Left;   CYSTOSCOPY WITH STENT PLACEMENT Left 04/22/2019   Procedure: CYSTOSCOPY WITH STENT CHANGE;  Surgeon: Raynelle Bring, MD;  Location: [facility];  Service: Urology;  Laterality: Left;   CYSTOSCOPY WITH STENT PLACEMENT Left 10/03/2019   Procedure: CYSTOSCOPY WITH STENT CHANGE;  Surgeon: Raynelle Bring, MD;  [facility];  Service: Urology;  Laterality: Left;   EYE SURGERY     FOOT SURGERY Left 06-23-2010   dr Beola Cord   left first and second toes   INSERTION OF MESH N/A 04/23/2013   Procedure: INSERTION OF MESH;  Surgeon: Adin Hector, MD;  Location: [facility];   Service: General;  Laterality: N/A;   JOINT REPLACEMENT     KNEE ARTHROSCOPY Right 04-27-2007  dr Shellia Carwin _6    LAPAROSCOPIC LYSIS OF ADHESIONS N/A 04/23/2013   Procedure: LAPAROSCOPIC LYSIS OF ADHESIONS;  Surgeon: Adin Hector, MD;  Location[facility];  Service: General;  Laterality: N/A;   [place]  12/17/2012   L3 -- L5 laminectomy and L3 -- 5 fusion   TOTAL HIP ARTHROPLASTY Left 07/04/2014   Procedure: LEFT TOTAL HIP ARTHROPLASTY ANTERIOR APPROACH;  Surgeon: Gearlean Alf, MD;  Location: [facility];  Service: Orthopedics;  Laterality: Left;   TOTAL HIP ARTHROPLASTY Right 2009   TOTAL KNEE ARTHROPLASTY Right 09-15-2009   dr Shellia Carwin _7   VENTRAL HERNIA REPAIR N/A 07/19/2012   Procedure: LAPAROSCOPIC LYSIS OF ADHESIONS, SMALL BOWEL RESECTION, SEROSAL REPAIR, PRIMARY VENTRAL HERNIA REPAIR;  Surgeon: Adin Hector, MD;  [facility];  Service: General;  Laterality: N/A;   VENTRAL HERNIA REPAIR N/A 04/23/2013   Procedure: LAPAROSCOPIC exploration and repair of hernia in abdominal VENTRAL wall  HERNIA;  Surgeon: Adin Hector, MD;  [facility];  Service: General;  Laterality: N/A;    Family History  Problem Relation Age of Onset   Diabetes Mother    Lung cancer Mother    Diabetes Father    Liver cancer Father    Colon cancer Sister        close to 59 per pt   Diabetes Sister    Diabetes Sister    Hypertension Sister    Kidney disease Sister    Seizures Daughter    Diabetes Son    Breast cancer Neg Hx    Allergic rhinitis Neg Hx    Angioedema Neg Hx    Asthma Neg Hx    Atopy Neg Hx    Eczema Neg Hx    Immunodeficiency Neg Hx    Urticaria Neg Hx    Colon polyps Neg Hx    Esophageal cancer Neg Hx    Rectal cancer Neg Hx    Stomach cancer Neg Hx       Controlled substance contract: n/a     Review of Systems  Constitutional:  Negative for diaphoresis.  Eyes:  Negative for pain.  Respiratory:  Positive  for shortness of breath.   Cardiovascular:  Positive for chest pain and leg swelling. Negative for palpitations.  Gastrointestinal:  Negative for abdominal pain.  Endocrine: Negative for polydipsia.  Skin:  Negative for rash.  Neurological:  Negative for dizziness, weakness and headaches.  Hematological:  Does not bruise/bleed easily.  All other systems reviewed and are negative.      Objective:   Physical Exam Vitals and nursing note reviewed.  Constitutional:      General: She is not in acute distress.    Appearance: Normal appearance. She is well-developed.  HENT:     Head: Normocephalic.     Right Ear: Tympanic membrane normal.     Left Ear: Tympanic membrane normal.     Nose: Nose normal.     Mouth/Throat:     Mouth: Mucous membranes are moist.  Eyes:     Pupils: Pupils are equal, round, and reactive to light.  Neck:     Vascular: No carotid bruit or JVD.  Cardiovascular:     Rate and Rhythm: Normal rate and regular rhythm.     Heart sounds: Normal heart sounds.  Pulmonary:     Effort: Pulmonary effort is normal. No respiratory distress.     Breath sounds: Normal breath sounds. No wheezing or rales.  Chest:     Chest wall: No tenderness.  Abdominal:     General: Bowel sounds are normal. There is no distension or abdominal bruit.     Palpations: Abdomen is soft. There is no hepatomegaly, splenomegaly, mass or pulsatile mass.     Tenderness: There is no abdominal tenderness.  Musculoskeletal:        General: Normal range of motion.     Cervical back: Normal range of motion and neck supple.  Lymphadenopathy:     Cervical: No cervical adenopathy.  Skin:    General: Skin is warm and dry.  Neurological:     Mental Status:  She is alert and oriented to person, place, and time.     Deep Tendon Reflexes: Reflexes are normal and symmetric.  Psychiatric:        Behavior: Behavior normal.        Thought Content: Thought content normal.        Judgment: Judgment normal.     BP 138/68   Pulse 88   Temp 98 F (36.7 C) (Temporal)   Resp 20   Ht 5' (1.524 m)   Wt 181 lb (82.1 kg)   SpO2 98%   BMI 35.35 kg/m    EKG Vito Backers, FNP      Assessment & Plan:   [name] comes in today with chief complaint of Medical Management of Chronic Issues (Feels like heart is beating fast at times)   Diagnosis and orders addressed:  1. Primary hypertension Low sodium diet - lisinopril (ZESTRIL) 20 MG tablet; Take 1 tablet (20 mg total) by mouth daily.  Dispense: 90 tablet; Refill: 1 - CBC with Differential/Platelet - CMP14+EGFR  2. Pure hypertriglyceridemia Low fat diet - Lipid panel  3. Peripheral edema Elevate legs when sitting - furosemide (LASIX) 20 MG tablet; Take 1 tablet (20 mg total) by mouth daily.  Dispense: 90 tablet; Refill: 1  4. Peripheral polyneuropathy Do not go bare footed Check bottom of feet daily.  5. Recurrent major depressive disorder, in partial remission ([place]) Stress management  6. Obesity (BMI 30-39.9) Discussed diet and exercise for person with BMI >25 Will recheck weight in 3-6 months  7. Chest pain, unspecified type Add baby aspirin to meds daily If develop chest pain that wont resolve call EMS - EKG 12-Lead - Ambulatory referral to Cardiology  8. Chronic midline thoracic back pain - amitriptyline (ELAVIL) 25 MG tablet; Take 1 tablet (25 mg total) by mouth at bedtime.  Dispense: 90 tablet; Refill: 1   Labs pending Health Maintenance reviewed Diet and exercise encouraged  Follow up plan: 6 months   Mary-Margaret Hassell Done, FNP

## 2021-12-01 LAB — CMP14+EGFR
ALT: 20 IU/L (ref 0–32)
AST: 21 IU/L (ref 0–40)
Albumin/Globulin Ratio: 1.8 (ref 1.2–2.2)
Albumin: 4.4 g/dL (ref 3.8–4.8)
Alkaline Phosphatase: 61 IU/L (ref 44–121)
BUN/Creatinine Ratio: 11 — ABNORMAL LOW (ref 12–28)
BUN: 11 mg/dL (ref 8–27)
Bilirubin Total: 0.7 mg/dL (ref 0.0–1.2)
CO2: 22 mmol/L (ref 20–29)
Calcium: 9.3 mg/dL (ref 8.7–10.3)
Chloride: 101 mmol/L (ref 96–106)
Creatinine, Ser: 1.03 mg/dL — ABNORMAL HIGH (ref 0.57–1.00)
Globulin, Total: 2.4 g/dL (ref 1.5–4.5)
Glucose: 96 mg/dL (ref 70–99)
Potassium: 4.7 mmol/L (ref 3.5–5.2)
Sodium: 138 mmol/L (ref 134–144)
Total Protein: 6.8 g/dL (ref 6.0–8.5)
eGFR: 56 mL/min/{1.73_m2} — ABNORMAL LOW (ref >59)

## 2021-12-01 LAB — CBC WITH DIFFERENTIAL/PLATELET
Basophils Absolute: 0 10*3/uL (ref 0.0–0.2)
Basos: 0 %
EOS (ABSOLUTE): 0.2 10*3/uL (ref 0.0–0.4)
Eos: 2 %
Hematocrit: 39.6 % (ref 34.0–46.6)
Hemoglobin: 13.8 g/dL (ref 11.1–15.9)
Immature Grans (Abs): 0 10*3/uL (ref 0.0–0.1)
Immature Granulocytes: 0 %
Lymphocytes Absolute: 2.5 10*3/uL (ref 0.7–3.1)
Lymphs: 26 %
MCH: 32.5 pg (ref 26.6–33.0)
MCHC: 34.8 g/dL (ref 31.5–35.7)
MCV: 93 fL (ref 79–97)
Monocytes Absolute: 0.5 10*3/uL (ref 0.1–0.9)
Monocytes: 6 %
Neutrophils Absolute: 6.1 10*3/uL (ref 1.4–7.0)
Neutrophils: 66 %
Platelets: 334 10*3/uL (ref 150–450)
RBC: 4.25 x10E6/uL (ref 3.77–5.28)
RDW: 13 % (ref 11.7–15.4)
WBC: 9.3 10*3/uL (ref 3.4–10.8)

## 2021-12-01 LAB — LIPID PANEL
Chol/HDL Ratio: 2.8 ratio (ref 0.0–4.4)
Cholesterol, Total: 218 mg/dL — ABNORMAL HIGH (ref 100–199)
HDL: 77 mg/dL (ref >39)
LDL Chol Calc (NIH): 123 mg/dL — ABNORMAL HIGH (ref 0–99)
Triglycerides: 106 mg/dL (ref 0–149)
VLDL Cholesterol Cal: 18 mg/dL (ref 5–40)

## 2021-12-01 NOTE — Addendum Note (Signed)
Addended by: Rolena Infante on: 12/01/2021 08:46 AM   Modules accepted: Orders

## 2021-12-03 ENCOUNTER — Ambulatory Visit: Payer: Medicare HMO | Admitting: Cardiology

## 2021-12-03 ENCOUNTER — Encounter: Payer: Self-pay | Admitting: Cardiology

## 2021-12-03 VITALS — BP 110/50 | HR 75 | Ht 60.0 in | Wt 184.2 lb

## 2021-12-03 DIAGNOSIS — R079 Chest pain, unspecified: Secondary | ICD-10-CM | POA: Diagnosis not present

## 2021-12-03 MED ORDER — METOPROLOL TARTRATE 25 MG PO TABS: 25.0000 mg | ORAL_TABLET | ORAL | 0 refills | Status: DC

## 2021-12-03 NOTE — Patient Instructions (Signed)
Medication Instructions:  The current medical regimen is effective;  continue present plan and medications.  *If you need a refill on your cardiac medications before your next appointment, please call your pharmacy*   Lab Work: None today If you have labs (blood work) drawn today and your tests are completely normal, you will receive your results only by: [place] (if you have MyChart) OR A paper copy in the mail If you have any lab test that is abnormal or we need to change your treatment, we will call you to review the results.   Testing/Procedures: Your physician has requested that you have an echocardiogram. Echocardiography is a painless test that uses sound waves to create images of your heart. It provides your doctor with information about the size and shape of your heart and how well your heart's chambers and valves are working. This procedure takes approximately one hour. There are no restrictions for this procedure.    Your cardiac CT will be scheduled at:   [facility] [address] [place],  [address] [phone]  Please arrive at the [facility] and Children's Entrance (Entrance C2) of [facility] 30 minutes prior to test start time. You can use the FREE valet parking offered at entrance C (encouraged to control the heart rate for the test)  Proceed to the [facility] Radiology Department (first floor) to check-in and test prep.  All radiology patients and guests should use entrance C2 at [facility], accessed from [facility], even though the hospital's physical address listed is [address]    Please follow these instructions carefully (unless otherwise directed):  On the Night Before the Test: Be sure to Drink plenty of water. Do not consume any caffeinated/decaffeinated beverages or chocolate 12 hours prior to your test. Do not take any antihistamines 12 hours prior to your test.  On the Day of  the Test: Drink plenty of water until 1 hour prior to the test. Do not eat any food 4 hours prior to the test. You may take your regular medications prior to the test.  Take metoprolol (Lopressor) two hours prior to test. HOLD Furosemide/Hydrochlorothiazide morning of the test. FEMALES- please wear underwire-free bra if available, avoid dresses & tight clothing      After the Test: Drink plenty of water. After receiving IV contrast, you may experience a mild flushed feeling. This is normal. On occasion, you may experience a mild rash up to 24 hours after the test. This is not dangerous. If this occurs, you can take Benadryl 25 mg and increase your fluid intake. If you experience trouble breathing, this can be serious. If it is severe call 911 IMMEDIATELY. If it is mild, please call our office. If you take any of these medications: Glipizide/Metformin, Avandament, Glucavance, please do not take 48 hours after completing test unless otherwise instructed.  We will call to schedule your test 2-4 weeks out understanding that some insurance companies will need an authorization prior to the service being performed.   For non-scheduling related questions, please contact the cardiac imaging nurse navigator should you have any questions/concerns: Marchia Bond, Cardiac Imaging Nurse Navigator Gordy Clement, Cardiac Imaging Nurse Navigator [facility] Direct Office Dial: [phone]   For scheduling needs, including cancellations and rescheduling, please call [place], [phone].  Follow-Up: At [facility], you and your health needs are our priority.  As part of our continuing mission to provide you with exceptional heart  care, we have created designated Provider Care Teams.  These Care Teams include your primary Cardiologist (physician) and Advanced Practice Providers (APPs -  Physician Assistants and Nurse Practitioners) who all work together to provide you with the  care you need, when you need it.  We recommend signing up for the patient portal called "MyChart".  Sign up information is provided on this After Visit Summary.  MyChart is used to connect with patients for Virtual Visits (Telemedicine).  Patients are able to view lab/test results, encounter notes, upcoming appointments, etc.  Non-urgent messages can be sent to your provider as well.   To learn more about what you can do with MyChart, go to [email or web address].    Your next appointment:   Follow as needed after the above testing.  Important Information About Sugar

## 2021-12-03 NOTE — Progress Notes (Signed)
Cardiology Office Note:    Date:  12/03/2021   ID:  [name], DOB 1946/05/07, MRN [id number]  PCP:  Chevis Pretty, [facility] Providers Cardiologist:  None     Referring MD: Hassell Done, Mary-Margaret, *    History of Present Illness:    [name] is a 76 y.o. female here for the evaluation of chest discomfort at the request of Chevis Pretty, [place].  One month ago began experiencing chest discomfort which was intermittent but seem to be brought on by exertional activity with no radiation.  After sitting down, pain was relieved.  If she pushed herself, felt the pain returned.  Thankfully over the last several days, she has felt better.  This is a new sensation for her.  NSR 65.  No ischemic changes on ECG personally reviewed from 11/30/2021  Never smoked  No early CAD family history  She had a sister with diabetes who did end up with heart disease.  Currently no fever chills nausea vomiting syncope bleeding   Past Medical History:  Diagnosis Date   Arthritis    Cancer ([place])    ovarian,skin Ca. in mouth   Cataract    Bil cataracts removed   Chronic constipation    Chronic kidney disease    Chronic midline low back pain with sciatica    Colon polyp    large ascending colon polyp , scheduled for resection 02/ 2021   Full dentures    History of endometrial cancer 2012   s/p   TAH w/ BSO 11-02-2010   History of recurrent UTIs    History of sepsis    multiple times (some due to e.coli) last sepsis 07/ 2019   History of small bowel obstruction    x2  ,  hx bowel resection   HOH (hard of hearing)    Hyperlipidemia    Hypertension    Retroperitoneal fibrosis    Substance abuse ([place])    opiod dependancy   Ureteral obstruction, left urologist-- dr Alinda Money   secondary to adhesions/ retroperitoneal fibrosis, treated with ureteral stent   Vertigo    Wears glasses     Past Surgical History:  Procedure Laterality Date   ABDOMINAL  HYSTERECTOMY  11/02/10  dr Alycia Rossetti  '@[facility]'$    TAH, BSO, incisional hernia repair , lysis of adhesions for endometrial cancer   ANTERIOR LUMBAR FUSION  01-28-2008   dr elsner  '@[facility]'$    L2 -- L4   BACK SURGERY     BUNIONECTOMY Right 2003   CATARACT EXTRACTION W/ INTRAOCULAR LENS  IMPLANT, BILATERAL  2007   COLONOSCOPY  last one 12-24-2018   CYSTO/  LEFT RETROGRADE PYELOGRAM/ URETEROSCOPY STENT PLACEMENT/  OPEN URETEROLYSIS WITH OMENTAL FLAP Left 03-11-2009    dr borden  '@[facility]'$    CYSTO/  [facility] SLING/  ANTERIOR REPAIR  12-06-2001    dr Jeffie Pollock '@[facility]'$    CYSTOSCOPY W/ URETERAL STENT PLACEMENT Left 12/07/2017   Procedure: CYSTOSCOPY WITH RETROGRADE PYELOGRAM/URETERAL STENT PLACEMENT;  Surgeon: Raynelle Bring, MD;  Location: [facility];  Service: Urology;  Laterality: Left;   CYSTOSCOPY W/ URETERAL STENT PLACEMENT Left 03/29/2018   Procedure: CYSTOSCOPY WITH STENT  EXCHANGE;  Surgeon: Raynelle Bring, MD;  [facility];  Service: Urology;  Laterality: Left;   CYSTOSCOPY WITH RETROGRADE PYELOGRAM, URETEROSCOPY AND STENT PLACEMENT Left 12-29-2008   dr Alinda Money  '@[facility]'$    WITH BALLOON DILATION LEFT URETERAL STRICTURE   CYSTOSCOPY WITH RETROGRADE PYELOGRAM, URETEROSCOPY AND STENT PLACEMENT Left 09-12-2008  dr Jeffie Pollock '@[facility]'$    CYSTOSCOPY WITH RETROGRADE PYELOGRAM, URETEROSCOPY AND STENT PLACEMENT Left 03/23/2020   Procedure: CYSTOSCOPY WITH LEFT RETROGRADE PYELOGRAM, AND LEFT STENT REMOVAL;  Surgeon: Raynelle Bring, MD;  [facility];  Service: Urology;  Laterality: Left;   CYSTOSCOPY WITH STENT PLACEMENT Left 07/26/2018   Procedure: CYSTOSCOPY WITH STENT CHANGE;  Surgeon: Raynelle Bring, MD;  [facility];  Service: Urology;  Laterality: Left;   CYSTOSCOPY WITH STENT PLACEMENT Left 12/03/2018   Procedure: CYSTOSCOPY WITH STENT CHANGE;  Surgeon: Raynelle Bring, MD;  [facility];  Service: Urology;  Laterality: Left;   CYSTOSCOPY WITH STENT PLACEMENT Left 04/22/2019   Procedure: CYSTOSCOPY WITH STENT CHANGE;   Surgeon: Raynelle Bring, MD;  Location: [facility];  Service: Urology;  Laterality: Left;   CYSTOSCOPY WITH STENT PLACEMENT Left 10/03/2019   Procedure: CYSTOSCOPY WITH STENT CHANGE;  Surgeon: Raynelle Bring, MD;  [facility];  Service: Urology;  Laterality: Left;   EYE SURGERY     FOOT SURGERY Left 06-23-2010   dr Beola Cord   left first and second toes   INSERTION OF MESH N/A 04/23/2013   Procedure: INSERTION OF MESH;  Surgeon: Adin Hector, MD;  [facility];  Service: General;  Laterality: N/A;   JOINT REPLACEMENT     KNEE ARTHROSCOPY Right 04-27-2007  dr Shellia Carwin '@[facility]'$    LAPAROSCOPIC LYSIS OF ADHESIONS N/A 04/23/2013   Procedure: LAPAROSCOPIC LYSIS OF ADHESIONS;  Surgeon: Adin Hector, MD;  [facility];  Service: General;  Laterality: N/A;   [place]  12/17/2012   L3 -- L5 laminectomy and L3 -- 5 fusion   TOTAL HIP ARTHROPLASTY Left 07/04/2014   Procedure: LEFT TOTAL HIP ARTHROPLASTY ANTERIOR APPROACH;  Surgeon: Gearlean Alf, MD;  Location: [place];  Service: Orthopedics;  Laterality: Left;   TOTAL HIP ARTHROPLASTY Right 2009   TOTAL KNEE ARTHROPLASTY Right 09-15-2009   dr Shellia Carwin '@[facility]'$    VENTRAL HERNIA REPAIR N/A 07/19/2012   Procedure: LAPAROSCOPIC LYSIS OF ADHESIONS, SMALL BOWEL RESECTION, SEROSAL REPAIR, PRIMARY VENTRAL HERNIA REPAIR;  Surgeon: Adin Hector, MD;  [facility];  Service: General;  Laterality: N/A;   VENTRAL HERNIA REPAIR N/A 04/23/2013   Procedure: LAPAROSCOPIC exploration and repair of hernia in abdominal VENTRAL wall  HERNIA;  Surgeon: Adin Hector, MD;  [facility];  Service: General;  Laterality: N/A;    Current Medications: Current Meds  Medication Sig   amitriptyline (ELAVIL) 25 MG tablet Take 1 tablet (25 mg total) by mouth at bedtime.   aspirin EC 81 MG tablet Take 81 mg by mouth daily. Swallow whole.   furosemide (LASIX) 20 MG tablet Take 1 tablet (20 mg  total) by mouth daily.   lisinopril (ZESTRIL) 20 MG tablet Take 1 tablet (20 mg total) by mouth daily.   meloxicam (MOBIC) 7.5 MG tablet Take 1 tablet by mouth once daily   methocarbamol (ROBAXIN) 500 MG tablet TAKE 1 TABLET BY MOUTH AT BEDTIME   metoprolol tartrate (LOPRESSOR) 25 MG tablet Take 1 tablet (25 mg total) by mouth as directed. Take one tablet 2 hours before your CT scan   Multiple Vitamins-Minerals (PRESERVISION AREDS) CAPS Take 1 capsule by mouth in the morning and at bedtime.   mupirocin ointment (BACTROBAN) 2 % Apply 1 Application topically as needed (for iritation).   nystatin cream (MYCOSTATIN) Apply 1 Application topically as needed for dry skin.   [START ON  01/29/2022] oxyCODONE-acetaminophen (PERCOCET) 7.5-325 MG tablet Take 1 tablet by mouth 3 (three) times daily as needed for severe pain.   [DISCONTINUED] mupirocin ointment (BACTROBAN) 2 % Apply 1 application. topically 2 (two) times daily.     Allergies:   Cashew nut oil, Dog epithelium, and Sulfa antibiotics   Social History   Socioeconomic History   Marital status: Single    Spouse name: Not on file   Number of children: 3   Years of education: 12   Highest education level: High school graduate  Occupational History   Occupation: Retired  Tobacco Use   Smoking status: Never   Smokeless tobacco: Never  Vaping Use   Vaping Use: Never used  Substance and Sexual Activity   Alcohol use: No   Drug use: No   Sexual activity: Not Currently    Birth control/protection: Surgical  Other Topics Concern   Not on file  Social History Narrative   Her son and DIL live with her   They help with bills   Social Determinants of Health   Financial Resource Strain: Low Risk  (06/21/2021)   Overall Financial Resource Strain (CARDIA)    Difficulty of Paying Living Expenses: Not hard at all  Food Insecurity: No Food Insecurity (06/21/2021)   Hunger Vital Sign    Worried About Running Out of Food in the Last Year: Never  true    [place] in the Last Year: Never true  Transportation Needs: No Transportation Needs (06/21/2021)   PRAPARE - Hydrologist (Medical): No    Lack of Transportation (Non-Medical): No  Physical Activity: Insufficiently Active (06/21/2021)   Exercise Vital Sign    Days of Exercise per Week: 7 days    Minutes of Exercise per Session: 20 min  Stress: No Stress Concern Present (06/21/2021)   [place]    Feeling of Stress : Not at all  Social Connections: Moderately Integrated (06/21/2021)   Social Connection and Isolation Panel [NHANES]    Frequency of Communication with Friends and Family: More than three times a week    Frequency of Social Gatherings with Friends and Family: More than three times a week    Attends Religious Services: More than 4 times per year    Active Member of [employer] or Organizations: Yes    Attends Archivist Meetings: More than 4 times per year    Marital Status: Widowed     Family History: The patient's family history includes Colon cancer in her sister; Diabetes in her father, mother, sister, sister, and son; Hypertension in her sister; Kidney disease in her sister; Liver cancer in her father; Lung cancer in her mother; Seizures in her daughter. There is no history of Breast cancer, Allergic rhinitis, Angioedema, Asthma, Atopy, Eczema, Immunodeficiency, Urticaria, Colon polyps, Esophageal cancer, Rectal cancer, or Stomach cancer.  ROS:   Please see the history of present illness.     All other systems reviewed and are negative.  EKGs/Labs/Other Studies Reviewed:    The following studies were reviewed today: No DVT   Recent Labs: 07/23/2021: Magnesium 1.9 09/03/2021: TSH 0.988 11/30/2021: ALT 20; BUN 11; Creatinine, Ser 1.03; Hemoglobin 13.8; Platelets 334; Potassium 4.7; Sodium 138  Recent Lipid Panel    Component Value Date/Time   CHOL 218 (H)  11/30/2021 1108   CHOL 154 11/13/2012 1018   TRIG 106 11/30/2021 1108   TRIG 86 11/13/2012 1018  HDL 77 11/30/2021 1108   HDL 57 11/13/2012 1018   CHOLHDL 2.8 11/30/2021 1108   LDLCALC 123 (H) 11/30/2021 1108   LDLCALC 80 11/13/2012 1018     Risk Assessment/Calculations:              Physical Exam:    VS:  BP (!) 110/50   Pulse 75   Ht 5' (1.524 m)   Wt 184 lb 3.2 oz (83.6 kg)   SpO2 97%   BMI 35.97 kg/m     Wt Readings from Last 3 Encounters:  12/03/21 184 lb 3.2 oz (83.6 kg)  11/30/21 181 lb (82.1 kg)  09/03/21 189 lb (85.7 kg)     GEN:  Well nourished, well developed in no acute distress HEENT: Normal NECK: No JVD; No carotid bruits LYMPHATICS: No lymphadenopathy CARDIAC: RRR, 1/6 SM, no rubs, gallops RESPIRATORY:  Clear to auscultation without rales, wheezing or rhonchi  ABDOMEN: Soft, non-tender, non-distended MUSCULOSKELETAL:  Left ankle edema; No deformity  SKIN: Warm and dry NEUROLOGIC:  Alert and oriented x 3 PSYCHIATRIC:  Normal affect   ASSESSMENT:    1. Chest pain of uncertain etiology    PLAN:    In order of problems listed above:  Chest pain - We will go ahead and check a coronary CT scan with possible FFR analysis to make sure that there is no signs of severe plaque.  She did have exertional discomfort relieved with rest.  Thankfully over the last few days she has felt better. -Most recent ECG shows heart rate of 65 bpm.  On auscultation today she is around there.  We will give her 25 mg of metoprolol. - Agree with low-dose aspirin - LDL 118 hemoglobin A1c 5.7 creatinine 0.65  Heart murmur - We will check an echocardiogram to assess her structure and function, valvular function.   Essential hypertension - Taking lisinopril 20 mg - Also on Lasix 20 mg - Creatinine 1.0      Medication Adjustments/Labs and Tests Ordered: Current medicines are reviewed at length with the patient today.  Concerns regarding medicines are outlined  above.  Orders Placed This Encounter  Procedures   CT CORONARY MORPH W/CTA COR W/SCORE W/CA W/CM &/OR WO/CM   ECHOCARDIOGRAM COMPLETE   Meds ordered this encounter  Medications   metoprolol tartrate (LOPRESSOR) 25 MG tablet    Sig: Take 1 tablet (25 mg total) by mouth as directed. Take one tablet 2 hours before your CT scan    Dispense:  1 tablet    Refill:  0    Patient Instructions  Medication Instructions:  The current medical regimen is effective;  continue present plan and medications.  *If you need a refill on your cardiac medications before your next appointment, please call your pharmacy*   Lab Work: None today If you have labs (blood work) drawn today and your tests are completely normal, you will receive your results only by: [place] (if you have MyChart) OR A paper copy in the mail If you have any lab test that is abnormal or we need to change your treatment, we will call you to review the results.   Testing/Procedures: Your physician has requested that you have an echocardiogram. Echocardiography is a painless test that uses sound waves to create images of your heart. It provides your doctor with information about the size and shape of your heart and how well your heart's chambers and valves are working. This procedure takes approximately one hour. There are no  restrictions for this procedure.    Your cardiac CT will be scheduled at:   [facility] [address] [place], [place] [address] [phone]  Please arrive at the [facility]'s Entrance (Entrance C2) of [facility] 30 minutes prior to test start time. You can use the FREE valet parking offered at entrance C (encouraged to control the heart rate for the test)  Proceed to the [facility] Radiology Department (first floor) to check-in and test prep.  All radiology patients and guests should use entrance C2 at [facility], accessed from [facility],  even though the hospital's physical address listed is [address].    Please follow these instructions carefully (unless otherwise directed):  On the Night Before the Test: Be sure to Drink plenty of water. Do not consume any caffeinated/decaffeinated beverages or chocolate 12 hours prior to your test. Do not take any antihistamines 12 hours prior to your test.  On the Day of the Test: Drink plenty of water until 1 hour prior to the test. Do not eat any food 4 hours prior to the test. You may take your regular medications prior to the test.  Take metoprolol (Lopressor) two hours prior to test. HOLD Furosemide/Hydrochlorothiazide morning of the test. FEMALES- please wear underwire-free bra if available, avoid dresses & tight clothing      After the Test: Drink plenty of water. After receiving IV contrast, you may experience a mild flushed feeling. This is normal. On occasion, you may experience a mild rash up to 24 hours after the test. This is not dangerous. If this occurs, you can take Benadryl 25 mg and increase your fluid intake. If you experience trouble breathing, this can be serious. If it is severe call 911 IMMEDIATELY. If it is mild, please call our office. If you take any of these medications: Glipizide/Metformin, Avandament, Glucavance, please do not take 48 hours after completing test unless otherwise instructed.  We will call to schedule your test 2-4 weeks out understanding that some insurance companies will need an authorization prior to the service being performed.   For non-scheduling related questions, please contact the cardiac imaging nurse navigator should you have any questions/concerns: Marchia Bond, Cardiac Imaging Nurse Navigator Gordy Clement, Cardiac Imaging Nurse Navigator [facility] Direct Office Dial: [phone]   For scheduling needs, including cancellations and rescheduling, please call [place],  [phone].  Follow-Up: At [facility], you and your health needs are our priority.  As part of our continuing mission to provide you with exceptional heart care, we have created designated Provider Care Teams.  These Care Teams include your primary Cardiologist (physician) and Advanced Practice Providers (APPs -  Physician Assistants and Nurse Practitioners) who all work together to provide you with the care you need, when you need it.  We recommend signing up for the patient portal called "MyChart".  Sign up information is provided on this After Visit Summary.  MyChart is used to connect with patients for Virtual Visits (Telemedicine).  Patients are able to view lab/test results, encounter notes, upcoming appointments, etc.  Non-urgent messages can be sent to your provider as well.   To learn more about what you can do with MyChart, go to [email or web address].    Your next appointment:   Follow as needed after the above testing.  Important Information About Sugar         Signed, Candee Furbish, MD  12/03/2021 3:44 PM  [facility]

## 2021-12-10 ENCOUNTER — Other Ambulatory Visit: Payer: Self-pay | Admitting: Nurse Practitioner

## 2021-12-10 DIAGNOSIS — G8929 Other chronic pain: Secondary | ICD-10-CM

## 2021-12-11 NOTE — Telephone Encounter (Signed)
Must be refused by PCP, controlled substance

## 2021-12-13 ENCOUNTER — Telehealth: Payer: Self-pay | Admitting: Nurse Practitioner

## 2021-12-13 ENCOUNTER — Other Ambulatory Visit: Payer: Self-pay | Admitting: Nurse Practitioner

## 2021-12-13 DIAGNOSIS — G8929 Other chronic pain: Secondary | ICD-10-CM

## 2021-12-13 MED ORDER — OXYCODONE-ACETAMINOPHEN 7.5-325 MG PO TABS: 1.0000 | ORAL_TABLET | Freq: Three times a day (TID) | ORAL | 0 refills | Status: DC | PRN

## 2021-12-13 NOTE — Telephone Encounter (Signed)
Already refilled, will not let me refuse

## 2021-12-13 NOTE — Telephone Encounter (Signed)
Meds ordered this encounter  Medications   oxyCODONE-acetaminophen (PERCOCET) 7.5-325 MG tablet    Sig: Take 1 tablet by mouth 3 (three) times daily as needed for severe pain.    Dispense:  90 tablet    Refill:  0    Fill 05/20/2019    Order Specific Question:   Supervising Provider    Answer:   Caryl Pina A [[id number]]

## 2021-12-14 ENCOUNTER — Ambulatory Visit (HOSPITAL_COMMUNITY): Payer: Medicare HMO | Attending: Cardiology

## 2021-12-14 DIAGNOSIS — R079 Chest pain, unspecified: Secondary | ICD-10-CM | POA: Diagnosis not present

## 2021-12-14 LAB — ECHOCARDIOGRAM COMPLETE
Area-P 1/2: 3.24 cm2
S' Lateral: 3 cm

## 2021-12-16 ENCOUNTER — Ambulatory Visit (INDEPENDENT_AMBULATORY_CARE_PROVIDER_SITE_OTHER)
Admission: RE | Admit: 2021-12-16 | Discharge: 2021-12-16 | Disposition: A | Discharge status: Home / Self Care | Payer: Medicare HMO | Source: Ambulatory Visit | Attending: Physician Assistant | Admitting: Physician Assistant

## 2021-12-16 ENCOUNTER — Ambulatory Visit (INDEPENDENT_AMBULATORY_CARE_PROVIDER_SITE_OTHER): Payer: Medicare HMO | Admitting: Physician Assistant

## 2021-12-16 ENCOUNTER — Encounter: Payer: Self-pay | Admitting: Physician Assistant

## 2021-12-16 VITALS — BP 122/84 | HR 63 | Ht 60.0 in | Wt 181.0 lb

## 2021-12-16 DIAGNOSIS — R1084 Generalized abdominal pain: Secondary | ICD-10-CM | POA: Diagnosis not present

## 2021-12-16 DIAGNOSIS — R6881 Early satiety: Secondary | ICD-10-CM | POA: Diagnosis not present

## 2021-12-16 DIAGNOSIS — Z8719 Personal history of other diseases of the digestive system: Secondary | ICD-10-CM

## 2021-12-16 DIAGNOSIS — K5651 Intestinal adhesions [bands], with partial obstruction: Secondary | ICD-10-CM

## 2021-12-16 DIAGNOSIS — Z86010 Personal history of colonic polyps: Secondary | ICD-10-CM | POA: Diagnosis not present

## 2021-12-16 DIAGNOSIS — R1030 Lower abdominal pain, unspecified: Secondary | ICD-10-CM | POA: Diagnosis not present

## 2021-12-16 DIAGNOSIS — R110 Nausea: Secondary | ICD-10-CM | POA: Diagnosis not present

## 2021-12-16 DIAGNOSIS — K5900 Constipation, unspecified: Secondary | ICD-10-CM

## 2021-12-16 NOTE — Progress Notes (Signed)
12/16/2021 [name] [id number] 1946-02-06  Referring provider: Chevis Pretty, * Primary GI doctor: Dr. Loletha Carrow  ASSESSMENT AND PLAN:   Assessment: 76 y.o. female here for assessment of the following: 1. Generalized abdominal pain   2. Nausea without vomiting   3. Early satiety   4. Obstipation   5. History of small bowel obstruction   6. Personal history of colonic polyps   7. Intestinal adhesions with partial obstruction [facility])    76 year old female presents for worsening abdominal pain, nausea, early satiety. Patient's had multiple abdominal surgeries, history of small bowel obstruction x 2, known adhesions, complicated by chronic oxycodone use. Last colonoscopy 2022, 12 mm polyp, recall 3 years.  Patient sounds like she does not have a complete obstruction, abdominal exam reassuring.  Will get x-ray to evaluate for obstruction and repeat CT scan abdomen and pelvis with significant history.  From history and physical sounds most likely that patient has chronic opioid induced constipation with possible gastroparesis. Given samples of Linzess, states her sons on this and has done well, is also on opioids. Understands not to take until after x-ray, will also add on MiraLAX.   Imperative to keep stools loose to prevent obstruction.  Early satiety and nausea sounds most like gastroparesis, no melena, reflux or dysphagia.  No significant weight loss. Given information about gastroparesis, can consider treatment with Reglan. Patient has been having chest pain with shortness of breath, getting CTA on the 17th.  If this is negative can consider evaluation with endoscopy or upper GI for early satiety, nausea and noncardiac chest pain.  Will do 23-monthfollow-up.  Patient given ER precautions  Plan: Will get x-ray and CT scan with symptoms and history of multiple small bowel obstructions. Get on MiraLAX, given Linzess samples can start after x-ray.  Can also consider  Amitiza versus Movantik Given information about gastroparesis. Close follow-up 2 months  Orders Placed This Encounter  Procedures   DG Abd 1 View   CT Abdomen Pelvis W Contrast     History of Present Illness:  76y.o. female  with a past medical history of ovarian cancer, CKD, endometrial cancer status post total abdominal hysterectomy, hyperlipidemia, hypertension, opioid dependency, personal history of tubular adenoma adenomas, one requiring surgical removal 08/2019, history of multiple bowel obstructions. And others listed below, returns to clinic today for evaluation of abdominal pain and decreased flatulence.  02/25/2021 colonoscopy due to personal history of colonic polyps.  Had greater than 4 cm cecal tubular villous adenoma requiring surgical remover 08/2019 Patient had good bowel prep.  Side-to-side ileocolonic anastomosis healthy-appearing mucosa, 12 mm tubular adenoma polyp transverse colon, recall 3 years. 07/22/2021 CT AB and pelvis with contrast for LLQ pain   Has grand daughter with her AEstill Bamberg  She states she has been having chest pains, following with Dr. SMarlou Porch last seen 12/03/2021 has CTA the 17th.  She states she has never passed gas her "entire" life, her stomach will swell and she will only pass gas once a month.  Gas X does not help.  She has constipation, has to take senokot to have BM. Last BM was yesterday, states they are mushy stools.  Denies hematochezia or melena.  She has nausea but no vomiting. Denies GERD.  She states she gets full very quickly after just 3-4 spoonfuls of food which has been going on for 6 weeks.  She has lost weight about 2-3 lbs in 1 month.   She has SOB with chest pain with  exertion, with diaphoresis.  Has CTA 08/17.  She is on oxycodone, 3 x a day. She has quit taking gabapentin due to dizziness but no new medications or other changes.    Current Medications:    Current Outpatient Medications (Cardiovascular):    furosemide  (LASIX) 20 MG tablet, Take 1 tablet (20 mg total) by mouth daily.   lisinopril (ZESTRIL) 20 MG tablet, Take 1 tablet (20 mg total) by mouth daily.   metoprolol tartrate (LOPRESSOR) 25 MG tablet, Take 1 tablet (25 mg total) by mouth as directed. Take one tablet 2 hours before your CT scan   Current Outpatient Medications (Analgesics):    aspirin EC 81 MG tablet, Take 81 mg by mouth daily. Swallow whole.   meloxicam (MOBIC) 7.5 MG tablet, Take 1 tablet by mouth once daily   [START ON 01/29/2022] oxyCODONE-acetaminophen (PERCOCET) 7.5-325 MG tablet, Take 1 tablet by mouth 3 (three) times daily as needed for severe pain.   Current Outpatient Medications (Other):    amitriptyline (ELAVIL) 25 MG tablet, Take 1 tablet (25 mg total) by mouth at bedtime.   methocarbamol (ROBAXIN) 500 MG tablet, TAKE 1 TABLET BY MOUTH AT BEDTIME   Multiple Vitamins-Minerals (PRESERVISION AREDS) CAPS, Take 1 capsule by mouth in the morning and at bedtime.   nystatin cream (MYCOSTATIN), Apply 1 Application topically as needed for dry skin.   mupirocin ointment (BACTROBAN) 2 %, Apply 1 Application topically as needed (for iritation). (Patient not taking: Reported on 12/16/2021)  Surgical History:  She  has a past surgical history that includes Foot surgery (Left, 06-23-2010   dr Beola Cord); Ventral hernia repair (N/A, 07/19/2012); Ventral hernia repair (N/A, 04/23/2013); Insertion of mesh (N/A, 04/23/2013); Laparoscopic lysis of adhesions (N/A, 04/23/2013); Total hip arthroplasty (Left, 07/04/2014); Cystoscopy w/ ureteral stent placement (Left, 12/07/2017); Cystoscopy w/ ureteral stent placement (Left, 03/29/2018); CYSTO/  SPARC SLING/  ANTERIOR REPAIR (12-06-2001    dr Jeffie Pollock '@[facility] ); Cataract extraction w/ intraocular lens  implant, bilateral (2007); Anterior lumbar fusion (01-28-2008   dr elsner  '@[facility] ); CYSTO/  LEFT RETROGRADE PYELOGRAM/ URETEROSCOPY STENT PLACEMENT/  OPEN URETEROLYSIS WITH OMENTAL FLAP (Left, 03-11-2009    dr  Alinda Money  '@[facility] ); Cystoscopy with retrograde pyelogram, ureteroscopy and stent placement (Left, 12-29-2008   dr Alinda Money  '@[facility] ); Cystoscopy with retrograde pyelogram, ureteroscopy and stent placement (Left, 09-12-2008    dr Jeffie Pollock '@[facility] ); Total knee arthroplasty (Right, 09-15-2009   dr Shellia Carwin [facility] ); Knee arthroscopy (Right, 04-27-2007  dr Shellia Carwin '@[facility] ); Posterior lumbar fusion (12/17/2012); Abdominal hysterectomy (11/02/10  dr Alycia Rossetti  '@[facility] ); Bunionectomy (Right, 2003); Lumbar spine surgery (1989 and 1999); Cystoscopy with stent placement (Left, 07/26/2018); Total hip arthroplasty (Right, 2009); Cystoscopy with stent placement (Left, 12/03/2018); Colonoscopy (last one 12-24-2018); Cystoscopy with stent placement (Left, 04/22/2019); Joint replacement; Back surgery; Eye surgery; Cystoscopy with stent placement (Left, 10/03/2019); and Cystoscopy with retrograde pyelogram, ureteroscopy and stent placement (Left, 03/23/2020). Family History:  Her family history includes Colon cancer in her sister; Diabetes in her father, mother, sister, sister, and son; Hypertension in her sister; Kidney disease in her sister; Liver cancer in her father; Lung cancer in her mother; Seizures in her daughter. Social History:   reports that she has never smoked. She has never used smokeless tobacco. She reports that she does not drink alcohol and does not use drugs.  Current Medications, Allergies, Past Medical History, Past Surgical History, Family History and Social History were reviewed in [employer] record.  Physical Exam: BP 122/84   Pulse 63  Ht 5' (1.524 m)   Wt 207 lb (93.9 kg)   BMI 40.43 kg/m  General:   Pleasant, well developed female in no acute distress Heart : Regular rate and rhythm; no murmurs Pulm: Clear anteriorly; no wheezing Abdomen:  Soft, Obese AB, NON DISTENEDED, Sluggish bowel sounds, more active LUQ, No tenderness . Without guarding and Without rebound, No organomegaly  appreciated. Rectal: Not evaluated Extremities:  with non pitting  edema left lower leg, no erythema, warmth, or tenderness. Neurologic:  Alert and  oriented x4;  No focal deficits.  Psych:  Cooperative. Normal mood and affect.   Vladimir Crofts, PA-C 12/16/21

## 2021-12-16 NOTE — Patient Instructions (Addendum)
Your provider has requested that you go to the basement level for an Xray of your abdomen before leaving today.  Press "B" on the elevator.  The lab is located at the first door on the left as you exit the elevator.  Due to recent changes in healthcare laws, you may see the results of your imaging and laboratory studies on MyChart before your provider has had a chance to review them.  We understand that in some cases there may be results that are confusing or concerning to you. Not all laboratory results come back in the same time frame and the provider may be waiting for multiple results in order to interpret others.  Please give [place] 48 hours in order for your provider to thoroughly review all the results before contacting the office for clarification of your results.    You will be contacted by [facility] in the next 2 days to arrange a CT Abdomen/Pelvis .  The number on your caller ID will be [phone], please answer when they call.  If you have not heard from them in 2 days please call [phone] to schedule.     You have been scheduled for a CT scan of the abdomen and pelvis at [facility], 1st floor Radiology. You are scheduled on ____________ at _________________. You should arrive 15 minutes prior to your appointment time for registration.  We are giving you 2 bottles of contrast today that you will need to drink before arriving for the exam. The solution may taste better if refrigerated so put them in the refrigerator when you get home, but do NOT add ice or any other liquid to this solution as that would dilute it. Shake well before drinking.   Please follow the written instructions below on the day of your exam:   1) Do not eat anything after ________ (4 hours prior to your test)   2) Drink 1 bottle of contrast @ ________ (2 hours prior to your exam)  Remember to shake well before drinking and do NOT pour over ice.     Drink 1 bottle of contrast @ _________  (1 hour prior to your exam)   You may take any medications as prescribed with a small amount of water, if necessary. If you take any of the following medications: METFORMIN, GLUCOPHAGE, GLUCOVANCE, AVANDAMET, RIOMET, FORTAMET, [place] MET, JANUMET, GLUMETZA or METAGLIP, you MAY be asked to HOLD this medication 48 hours AFTER the exam.   The purpose of you drinking the oral contrast is to aid in the visualization of your intestinal tract. The contrast solution may cause some diarrhea. Depending on your individual set of symptoms, you may also receive an intravenous injection of x-ray contrast/dye. Plan on being at [facility] for 45 minutes or longer, depending on the type of exam you are having performed.   If you have any questions regarding your exam or if you need to reschedule, you may call [facility] at [phone] between the hours of 8:00 am and 5:00 pm, Monday-Friday.     Miralax is an osmotic laxative.  It only brings more water into the stool.  This is safe to take daily.  Can take up to 17 gram of miralax twice a day.  Mix with juice or coffee.  Start 1 capful at night for 3-4 days and reassess your response in 3-4 days.  You can increase and decrease the dose based on your response.  Remember, it can take  up to 3-4 days to take effect OR for the effects to wear off.   Linzess - DO NOT TAKE UNTIL AFTER XRAY RESULTS! Take at least 30 minutes before the first meal of the day on an empty stomach You can have a loose stool if you eat a high-fat breakfast. Give it at least 4 days, may have more bowel movements during that time.  If you continue to have severe diarrhea try every other day, if you get AB pain with it stop and call our office.  After you are out we can send in a prescription if you did well, there is a prescription card  Gastroparesis- LIKELY FROM YOUR MEDICATIONS.  Please do small frequent meals like 4-6 meals a day.  Eat and drink liquids at separate  times.  Avoid high fiber foods, cook your vegetables, avoid high fat food.  Suggest spreading protein throughout the day (greek yogurt, glucerna, soft meat, milk, eggs) Choose soft foods that you can mash with a fork When you are more symptomatic, change to pureed foods foods and liquids.  Consider reading "Living well with Gastroparesis" by Lambert Keto Gastroparesis is a condition in which food takes longer than normal to empty from the stomach. This condition is also known as delayed gastric emptying. It is usually a long-term (chronic) condition. There is no cure, but there are treatments and things that you can do at home to help relieve symptoms. Treating the underlying condition that causes gastroparesis can also help relieve symptoms What are the causes? In many cases, the cause of this condition is not known. Possible causes include: A hormone (endocrine) disorder, such as hypothyroidism or diabetes. A nervous system disease, such as Parkinson's disease or multiple sclerosis. Cancer, infection, or surgery that affects the stomach or vagus nerve. The vagus nerve runs from your chest, through your neck, and to the lower part of your brain. A connective tissue disorder, such as scleroderma. Certain medicines. What increases the risk? You are more likely to develop this condition if: You have certain disorders or diseases. These may include: An endocrine disorder. An eating disorder. Amyloidosis. Scleroderma. Parkinson's disease. Multiple sclerosis. Cancer or infection of the stomach or the vagus nerve. You have had surgery on your stomach or vagus nerve. You take certain medicines. You are female. What are the signs or symptoms? Symptoms of this condition include: Feeling full after eating very little or a loss of appetite. Nausea, vomiting, or heartburn. Bloating of your abdomen. Inconsistent blood sugar (glucose) levels on blood tests. Unexplained weight loss. Acid  from the stomach coming up into the esophagus (gastroesophageal reflux). Sudden tightening (spasm) of the stomach, which can be painful. Symptoms may come and go. Some people may not notice any symptoms. How is this diagnosed? This condition is diagnosed with tests, such as: Tests that check how long it takes food to move through the stomach and intestines. These tests include: Upper gastrointestinal (GI) series. For this test, you drink a liquid that shows up well on X-rays, and then X-rays are taken of your intestines. Gastric emptying scintigraphy. For this test, you eat food that contains a small amount of radioactive material, and then scans are taken. Wireless capsule GI monitoring system. For this test, you swallow a pill (capsule) that records information about how foods and fluid move through your stomach. Gastric manometry. For this test, a tube is passed down your throat and into your stomach to measure electrical and muscular activity. Endoscopy. For this test, a  long, thin tube with a camera and light on the end is passed down your throat and into your stomach to check for problems in your stomach lining. Ultrasound. This test uses sound waves to create images of the inside of your body. This can help rule out gallbladder disease or pancreatitis as a cause of your symptoms. How is this treated? There is no cure for this condition, but treatment and home care may relieve symptoms. Treatment may include: Treating the underlying cause. Managing your symptoms by making changes to your diet and exercise habits. Taking medicines to control nausea and vomiting and to stimulate stomach muscles. Getting food through a feeding tube in the hospital. This may be done in severe cases. Having surgery to insert a device called a gastric electrical stimulator into your body. This device helps improve stomach emptying and control nausea and vomiting. Follow these instructions at home: Take  over-the-counter and prescription medicines only as told by your health care provider. Follow instructions from your health care provider about eating or drinking restrictions. Your health care provider may recommend that you: Eat smaller meals more often. Eat low-fat foods. Eat low-fiber forms of high-fiber foods. For example, eat cooked vegetables instead of raw vegetables. Have only liquid foods instead of solid foods. Liquid foods are easier to digest. Drink enough fluid to keep your urine pale yellow. Exercise as often as told by your health care provider. Keep all follow-up visits. This is important. Contact a health care provider if you: Notice that your symptoms do not improve with treatment. Have new symptoms. Get help right away if you: Have severe pain in your abdomen that does not improve with treatment. Have nausea that is severe or does not go away. Vomit every time you drink fluids. Summary Gastroparesis is a long-term (chronic) condition in which food takes longer than normal to empty from the stomach. Symptoms include nausea, vomiting, heartburn, bloating of your abdomen, and loss of appetite. Eating smaller portions, low-fat foods, and low-fiber forms of high-fiber foods may help you manage your symptoms. Get help right away if you have severe pain in your abdomen. This information is not intended to replace advice given to you by your health care provider. Make sure you discuss any questions you have with your health care provider. Document Revised: 09/02/2019 Document Reviewed: 09/02/2019 Elsevier Patient Education  2021 [employer].  I appreciate the  opportunity to care for you  Thank You   [employer]   .

## 2021-12-17 NOTE — Telephone Encounter (Signed)
Pt called wanting to know if MMM has lost her mind.   Says pt had an appt with MMM on 11/30/21 and refill for her Oxycodone Rx was supposed to be send in. Pt spoke with the pharmacy and was told that they received a Rx for Oxycodone but says they cant fill it until 01/29/22 per the start date. Pt asked pharmacy if there were any refills on previous script that was sent in for her back in April and pharmacy said no. It was just for the 1 90 days supply with no refills.   Pt says she is out of her medicine and needs refills sent in ASAP.

## 2021-12-20 ENCOUNTER — Other Ambulatory Visit: Payer: Self-pay | Admitting: Nurse Practitioner

## 2021-12-20 DIAGNOSIS — G8929 Other chronic pain: Secondary | ICD-10-CM

## 2021-12-20 MED ORDER — OXYCODONE-ACETAMINOPHEN 7.5-325 MG PO TABS: 1.0000 | ORAL_TABLET | Freq: Three times a day (TID) | ORAL | 0 refills | Status: DC | PRN

## 2021-12-20 NOTE — Telephone Encounter (Signed)
Patient notified about rx being sent to pharmacy and verbalized understanding

## 2021-12-20 NOTE — Telephone Encounter (Signed)
Actually patient needs to be seen for pain medication refill.

## 2021-12-21 ENCOUNTER — Encounter (HOSPITAL_COMMUNITY): Payer: Self-pay

## 2021-12-22 ENCOUNTER — Telehealth (HOSPITAL_COMMUNITY): Payer: Self-pay | Admitting: *Deleted

## 2021-12-22 NOTE — Telephone Encounter (Signed)
Reaching out to patient to offer assistance regarding upcoming cardiac imaging study; pt verbalizes understanding of appt date/time, parking situation and where to check in, pre-test NPO status and medications ordered, and verified current allergies; name and call back number provided for further questions should they arise  Gordy Clement RN Navigator Cardiac Imaging [facility] [phone] office [phone] cell  Patient to take '25mg'$  metoprolol tartrate two hours prior to her cardiac CT scan. She is aware to arrive at 10:30am.

## 2021-12-23 ENCOUNTER — Ambulatory Visit (HOSPITAL_BASED_OUTPATIENT_CLINIC_OR_DEPARTMENT_OTHER)
Admission: RE | Admit: 2021-12-23 | Discharge: 2021-12-23 | Disposition: A | Discharge status: Home / Self Care | Payer: Medicare HMO | Source: Ambulatory Visit | Attending: Cardiology | Admitting: Cardiology

## 2021-12-23 ENCOUNTER — Ambulatory Visit (HOSPITAL_COMMUNITY)
Admission: RE | Admit: 2021-12-23 | Discharge: 2021-12-23 | Disposition: A | Discharge status: Home / Self Care | Payer: Medicare HMO | Source: Ambulatory Visit | Attending: Cardiology | Admitting: Cardiology

## 2021-12-23 ENCOUNTER — Other Ambulatory Visit: Payer: Self-pay | Admitting: Cardiology

## 2021-12-23 DIAGNOSIS — R931 Abnormal findings on diagnostic imaging of heart and coronary circulation: Secondary | ICD-10-CM | POA: Diagnosis not present

## 2021-12-23 DIAGNOSIS — I2510 Atherosclerotic heart disease of native coronary artery without angina pectoris: Secondary | ICD-10-CM | POA: Insufficient documentation

## 2021-12-23 DIAGNOSIS — R079 Chest pain, unspecified: Secondary | ICD-10-CM | POA: Insufficient documentation

## 2021-12-23 MED ORDER — NITROGLYCERIN 0.4 MG SL SUBL
0.8000 mg | SUBLINGUAL_TABLET | Freq: Once | SUBLINGUAL | Status: AC
Start: 2021-12-23 — End: 2021-12-23
  Administered 2021-12-23: 0.8 mg via SUBLINGUAL

## 2021-12-23 MED ORDER — IOHEXOL 350 MG/ML SOLN
100.0000 mL | Freq: Once | INTRAVENOUS | Status: AC | PRN
  Administered 2021-12-23: 100 mL via INTRAVENOUS

## 2021-12-23 MED ORDER — NITROGLYCERIN 0.4 MG SL SUBL
SUBLINGUAL_TABLET | SUBLINGUAL | Status: AC
  Filled 2021-12-23: qty 2

## 2021-12-29 ENCOUNTER — Ambulatory Visit (HOSPITAL_COMMUNITY)
Admission: RE | Admit: 2021-12-29 | Discharge: 2021-12-29 | Disposition: A | Discharge status: Home / Self Care | Payer: Medicare HMO | Source: Ambulatory Visit | Attending: Physician Assistant | Admitting: Physician Assistant

## 2021-12-29 DIAGNOSIS — K5651 Intestinal adhesions [bands], with partial obstruction: Secondary | ICD-10-CM | POA: Diagnosis not present

## 2021-12-29 DIAGNOSIS — R109 Unspecified abdominal pain: Secondary | ICD-10-CM | POA: Diagnosis not present

## 2021-12-29 DIAGNOSIS — R6881 Early satiety: Secondary | ICD-10-CM | POA: Insufficient documentation

## 2021-12-29 DIAGNOSIS — Z8719 Personal history of other diseases of the digestive system: Secondary | ICD-10-CM | POA: Diagnosis not present

## 2021-12-29 DIAGNOSIS — I700 Atherosclerosis of aorta: Secondary | ICD-10-CM | POA: Diagnosis not present

## 2021-12-29 DIAGNOSIS — R1084 Generalized abdominal pain: Secondary | ICD-10-CM | POA: Insufficient documentation

## 2021-12-29 DIAGNOSIS — R110 Nausea: Secondary | ICD-10-CM | POA: Diagnosis not present

## 2021-12-29 DIAGNOSIS — R1110 Vomiting, unspecified: Secondary | ICD-10-CM | POA: Diagnosis not present

## 2021-12-29 MED ORDER — IOHEXOL 300 MG/ML  SOLN
100.0000 mL | Freq: Once | INTRAMUSCULAR | Status: AC | PRN
  Administered 2021-12-29: 100 mL via INTRAVENOUS

## 2021-12-29 MED ORDER — SODIUM CHLORIDE (PF) 0.9 % IJ SOLN
INTRAMUSCULAR | Status: AC
  Filled 2021-12-29: qty 50

## 2021-12-31 NOTE — Progress Notes (Signed)
____________________________________________________________  Attending physician addendum:  Thank you for sending this case to me. I have reviewed the entire note and agree with the plan.  I suspect she is more likely to have opioid induced delayed gastric emptying rather than other causes of gastroparesis.  As such, I would be reluctant to use metoclopramide in this patient, favoring Movantik given her concomitant constipation. You are correct she has a history of intra-abdominal adhesions, and her April 2021 right colon resection operative report notes extensive lysis of adhesions required during that procedure. We will most likely not be able to completely relieve her abdominal pain due to the above noted factors, but hopefully we can achieve some relief of the constipation with available therapies.  Wilfrid Lund, MD  ____________________________________________________________

## 2022-01-03 ENCOUNTER — Other Ambulatory Visit: Payer: Self-pay | Admitting: Physician Assistant

## 2022-01-03 MED ORDER — PANTOPRAZOLE SODIUM 40 MG PO TBEC: DELAYED_RELEASE_TABLET | ORAL | 3 refills | Status: DC

## 2022-01-04 ENCOUNTER — Encounter: Payer: Self-pay | Admitting: Nurse Practitioner

## 2022-01-04 ENCOUNTER — Ambulatory Visit (INDEPENDENT_AMBULATORY_CARE_PROVIDER_SITE_OTHER): Payer: Medicare HMO | Admitting: Nurse Practitioner

## 2022-01-04 DIAGNOSIS — G8929 Other chronic pain: Secondary | ICD-10-CM

## 2022-01-04 DIAGNOSIS — M546 Pain in thoracic spine: Secondary | ICD-10-CM

## 2022-01-04 MED ORDER — OXYCODONE-ACETAMINOPHEN 7.5-325 MG PO TABS: 1.0000 | ORAL_TABLET | Freq: Three times a day (TID) | ORAL | 0 refills | Status: DC | PRN

## 2022-01-04 MED ORDER — OXYCODONE-ACETAMINOPHEN 7.5-325 MG PO TABS: 1.0000 | ORAL_TABLET | Freq: Three times a day (TID) | ORAL | 0 refills | Status: AC | PRN

## 2022-01-04 NOTE — Patient Instructions (Signed)
Opioid Pain Medicine Management Opioid pain medicines are strong medicines that are used to treat bad or very bad pain. When you take them for a short time, they can help you: Sleep better. Do better in physical therapy. Feel better during the first few days after you get hurt. Recover from surgery. Only take these medicines if a doctor says that you can. You should only take them for a short time. This is because opioids can be very addictive. This means that they are hard to stop taking. The longer you take opioids, the harder it may be to stop taking them. What are the risks? Opioids can cause problems (side effects). Taking them for more than 3 days raises your chance of problems, such as: Trouble pooping (constipation). Feeling sick to your stomach (nausea). Vomiting. Feeling very sleepy. Confusion. Not being able to stop taking the medicine. Breathing problems. Taking opioids for a long time can make it hard for you to do daily tasks. It can also put you at risk for: Car accidents. Depression. Suicide. Heart attack. Taking too much of the medicine (overdose). This can lead to death. What is a pain treatment plan? A pain treatment plan is a plan made by you and your doctor. Work with your doctor to make a plan for treating your pain. To help you do this: Talk about the goals of your treatment, including: How much pain you might expect to have. How you will manage the pain. Talk about the risks and benefits of taking these medicines for your condition. Remember that a good treatment plan uses more than one approach and lowers the risks of side effects. Tell your doctor about the amount of medicines you take and about any drug or alcohol use. Get your pain medicine prescriptions from only one doctor. Pain can be managed with other treatments. Work with your doctor to find other ways to help your pain, such as: Physical therapy or doing gentle exercises. Counseling. Eating healthy  foods. Massage. Meditation. Other pain medicines. How to use opioid pain medicine safely Taking medicine Take your pain medicine exactly as told by your doctor. Take it only when you need it. If your pain is not too bad, you may take less medicine if your doctor allows. If you have no pain, do not take the medicine unless your doctor tells you to take it. If your pain is very bad, do not take more medicine than your doctor told you to take. Call your doctor to know what to do. Write down the times when you take your pain medicine. Look at the times before you take your next dose. Take other over-the-counter or prescription medicines only as told by your doctor. Keeping yourself and others safe  While you are taking opioids: Do not drive, use machines, or power tools. Do not sign important papers (legal documents). Do not drink alcohol. Do not take sleeping pills. Do not take care of children by yourself. Do not do activities where you need to climb or be in high places, like working on a ladder. Do not go to a lake, river, ocean, swimming pool, or hot tub. Keep your opioids locked up or in a place where children cannot reach them. Do not share your pain medicine with anyone. Stopping your use of opioids If you have been taking opioids for more than a few weeks, you may need to slowly decrease (taper) how much you take until you stop taking them. Doing this can lower your chance   of having symptoms.  Symptoms that come from suddenly stopping the use of opioids include: Pain and cramping in your belly (abdomen). Feeling sick to your stomach (nausea).z Sweating. Feeling very sleepy. Feeling restless. Shaking you cannot control (tremors). Cravings for the medicine. Do not try to stop taking them by yourself. Work with your doctor to stop. Your doctor will help you take less until you are not taking the medicine at all. Getting rid of unused pills Do not save any pills that you did not  use. Get rid of the pills by: Taking them to a take-back program in your area. Bringing them to a pharmacy that receives unused pills. Flushing them down the toilet. Check the label or package insert of your medicine to see whether this is safe to do. Throwing them in the trash. Check the label or package insert of your medicine to see whether this is safe to do. If it is safe to throw them out: Take the pills out of their container. Put the pills into a container you can seal. Mix the pills with used coffee grounds, food scraps, dirt, or cat litter. Put this in the trash. Follow these instructions at home: Activity Do exercises as told by your doctor. Avoid doing things that make your pain worse. Return to your normal activities as told by your doctor. Ask your doctor what activities are safe for you. General instructions You may need to take these actions to prevent or treat constipation: Drink enough fluid to keep your pee (urine) pale yellow. Take over-the-counter or prescription medicines. Eat foods that are high in fiber. These include beans, whole grains, and fresh fruits and vegetables. Limit foods that are high in fat and sugar. These include fried or sweet foods. Keep all follow-up visits. Where to find support If you have been taking opioids for a long time, get help from a local support group or counselor. Ask your doctor about this. Where to find more information Centers for Disease Control and Prevention (CDC): www.cdc.gov [place] Food and Drug Administration (FDA): www.fda.gov Get help right away if: You may have taken too much of an opioid (overdosed). Common symptoms of an overdose: Your breathing is slower or more shallow than normal. You have a very slow heartbeat. Your speech is not normal. You vomit or you feel as if you may vomit. The black centers of your eyes (pupils) are smaller than normal. You have other potential symptoms: You feel very confused. You  faint. You are very sleepy. You have cold skin. You have blue lips or fingernails. You have thoughts of harming yourself or harming others. These symptoms may be an emergency. Get help right away. Call your local emergency services (911 in the [place] Do not wait to see if the symptoms will go away. Do not drive yourself to the hospital. Get help right away if you feel like you may hurt yourself or others, or have thoughts about taking your own life. Go to your nearest emergency room or: Call your local emergency services (911 in the [place] Call the National Poison Control Center at 1-800-222-1222. Call a suicide crisis helpline, such as the National Suicide Prevention Lifeline at 1-800-273-8255 or 988 in the [place] This is open 24 hours a day. Text the Crisis Text Line at 741741. Summary Opioid are strong medicines that are used to treat bad or very bad pain. A pain treatment plan is a plan made by you and your doctor. Work with your doctor to make a   plan for treating your pain. If you think that you or someone else may have taken too much of an opioid, get help right away. This information is not intended to replace advice given to you by your health care provider. Make sure you discuss any questions you have with your health care provider. Document Revised: 11/18/2020 Document Reviewed: 08/05/2020 Elsevier Patient Education  2023 [employer] Inc[employer]  

## 2022-01-04 NOTE — Progress Notes (Signed)
Subjective:    Patient ID: [name], female    DOB: [birth year], 76 y.o.   MRN: [id number]   Chief Complaint: Discuss pain meds   HPI Patient here to discuss test results. She was having chest pain and saw cardiology and test results showed heart is good. She is still having abdominal pain. Ct of abdomen was negative. She is still having chest pain intermittently. She has chest pain every 2-3 days and lasts about minutes to hours.   Pain assessment: Cause of pain- DDD Pain location- low back pain Pain on scale of 1-10- 4-5/10 currently Frequency- daily What increases pain-activity What makes pain Better-rest helsp Effects on ADL - none Any change in general medical condition-none  Current opioids rx- oxycodone 7.5/325 # meds rx- 90 Effectiveness of current meds-helps Adverse reactions from pain meds-none Morphine equivalent- 33.75  Pill count performed-No Last drug screen - 03/09/21 ( high risk q31m moderate risk q614mlow risk yearly ) Urine drug screen today- No [place]  If yes were their any concerning findings? - no   Overdose risk: 1    09/15/2020   10:41 AM  Opioid Risk   Alcohol 0  Illegal Drugs 0  Rx Drugs 0  Alcohol 0  Illegal Drugs 0  Rx Drugs 0  Age between 16-45 years  0  History of Preadolescent Sexual Abuse 0  Psychological Disease 0  Depression 0  Opioid Risk Tool Scoring 0  Opioid Risk Interpretation Low Risk      Review of Systems  Cardiovascular:  Positive for chest pain. Negative for palpitations and leg swelling.  Musculoskeletal:  Positive for back pain.       Objective:   Physical Exam Constitutional:      Appearance: Normal appearance. She is obese.  Cardiovascular:     Rate and Rhythm: Normal rate and regular rhythm.     Heart sounds: Normal heart sounds.  Pulmonary:     Effort: Pulmonary effort is normal.     Breath sounds: Normal breath sounds.  Musculoskeletal:     Comments: Gailt slow and  steady  (-) SLR bil  Skin:    General: Skin is warm.  Neurological:     General: No focal deficit present.     Mental Status: She is alert and oriented to person, place, and time.  Psychiatric:        Mood and Affect: Mood normal.        Behavior: Behavior normal.    BP 120/62   Pulse 67   Temp 97.6 F (36.4 C) (Temporal)   Resp 20   Ht 5' (1.524 m)   Wt 182 lb (82.6 kg)   SpO2 96%   BMI 35.54 kg/m          Assessment & Plan:   FrEduard Closn today with chief complaint of Discuss pain meds   1. Chronic midline thoracic back pain Moist heat' rest RT prn - oxyCODONE-acetaminophen (PERCOCET) 7.5-325 MG tablet; Take 1 tablet by mouth 3 (three) times daily as needed for severe pain.  Dispense: 90 tablet; Refill: 0 - oxyCODONE-acetaminophen (PERCOCET) 7.5-325 MG tablet; Take 1 tablet by mouth 3 (three) times daily as needed for severe pain.  Dispense: 90 tablet; Refill: 0 - oxyCODONE-acetaminophen (PERCOCET) 7.5-325 MG tablet; Take 1 tablet by mouth 3 (three) times daily as needed for severe pain.  Dispense: 90 tablet; Refill: 0  Reviewed abdominal ct and Heart CT with patient  The  above assessment and management plan was discussed with the patient. The patient verbalized understanding of and has agreed to the management plan. Patient is aware to call the clinic if symptoms persist or worsen. Patient is aware when to return to the clinic for a follow-up visit. Patient educated on when it is appropriate to go to the emergency department.   Mary-Margaret Hassell Done, FNP

## 2022-01-06 ENCOUNTER — Other Ambulatory Visit: Payer: Self-pay | Admitting: *Deleted

## 2022-01-06 DIAGNOSIS — Z789 Other specified health status: Secondary | ICD-10-CM

## 2022-01-06 DIAGNOSIS — E781 Pure hyperglyceridemia: Secondary | ICD-10-CM

## 2022-01-21 ENCOUNTER — Other Ambulatory Visit: Payer: Self-pay | Admitting: Nurse Practitioner

## 2022-01-21 DIAGNOSIS — G8929 Other chronic pain: Secondary | ICD-10-CM

## 2022-02-16 DIAGNOSIS — H9190 Unspecified hearing loss, unspecified ear: Secondary | ICD-10-CM | POA: Diagnosis not present

## 2022-02-18 ENCOUNTER — Other Ambulatory Visit: Payer: Self-pay | Admitting: Nurse Practitioner

## 2022-02-18 DIAGNOSIS — G8929 Other chronic pain: Secondary | ICD-10-CM

## 2022-02-22 DIAGNOSIS — H353131 Nonexudative age-related macular degeneration, bilateral, early dry stage: Secondary | ICD-10-CM | POA: Diagnosis not present

## 2022-02-22 NOTE — Progress Notes (Unsigned)
Patient ID: [name]                 DOB: 06/10/1945                    MRN: [id number]      HPI: [name] is a 76 y.o. female patient referred to lipid clinic by Dr. Jasper Riling. PMH is significant for hypertension obesity, hyperlipidemia, and Hx of endometrial cancer.CT coronary FFR from 08/17 demonstrate presence of coronary plaque but no effect on the flow was noted. Coronary calcium score of 606.   Today no acute concern reported by patient. Patient does not recall all the pervious medications names that she had tried for cholesterol but she remember the last one atorvastatin and she had stopped taking long time not sure how long ago. However, she is sure that when she went for last lipid lab she was not taking any medications for cholesterol.Patient reports having bad pain issue due to multiple back surgery and sees the pain specialist. Patient recalls having pain while on atorvastatin but after stopping statin the pain did not improve. So, patient agrees that statin may not be  the sole reason for the pain. After discussing the risk and benefits of statins patient is in agreement to try atorvastatin 40 mg again.   Current Medications: none  Intolerances: simvastatin 20 mg rosuvastatin 10 mg atorvastatin 40 mg  Risk Factors: hypertension, age, high CAC score (606)  LDL goal: <70 mg/dl  Diet:  Patient eats anything she wants to eat, encourage her to eat less fat in the diet. Eat out only once week. For breakfast like to fried toast, encourage her not to add or add less butter. Eats usually grilled meat instead of fried   Exercise: walk around the playground every other day while great grand children play outside, loves to do gardening. No set exercise regimen.   Family History: Diabetes - father, mother, sisters and children  Social History:  Alcohol: none Smoking: Never  Labs:  Lipid Panel (patient was not any statin or any lipid lowering agent when went for lipid  lab)      Component Value Date/Time   CHOL 218 (H) 11/30/2021 1108   CHOL 154 11/13/2012 1018   TRIG 106 11/30/2021 1108   TRIG 86 11/13/2012 1018   HDL 77 11/30/2021 1108   HDL 57 11/13/2012 1018   CHOLHDL 2.8 11/30/2021 1108   LDLCALC 123 (H) 11/30/2021 1108   LDLCALC 80 11/13/2012 1018   LABVLDL 18 11/30/2021 1108    Past Medical History:  Diagnosis Date   Arthritis    Cancer ([place])    ovarian,skin Ca. in mouth   Cataract    Bil cataracts removed   Chronic constipation    Chronic kidney disease    Chronic midline low back pain with sciatica    Colon polyp    large ascending colon polyp , scheduled for resection 02/ 2021   Full dentures    History of endometrial cancer 2012   s/p   TAH w/ BSO 11-02-2010   History of recurrent UTIs    History of sepsis    multiple times (some due to e.coli) last sepsis 07/ 2019   History of small bowel obstruction    x2  ,  hx bowel resection   HOH (hard of hearing)    Hyperlipidemia    Hypertension    Retroperitoneal fibrosis    Substance abuse ([place])  opiod dependancy   Ureteral obstruction, left urologist-- dr Alinda Money   secondary to adhesions/ retroperitoneal fibrosis, treated with ureteral stent   Vertigo    Wears glasses     Current Outpatient Medications on File Prior to Visit  Medication Sig Dispense Refill   amitriptyline (ELAVIL) 25 MG tablet Take 1 tablet (25 mg total) by mouth at bedtime. 90 tablet 1   aspirin EC 81 MG tablet Take 81 mg by mouth daily. Swallow whole.     furosemide (LASIX) 20 MG tablet Take 1 tablet (20 mg total) by mouth daily. 90 tablet 1   lisinopril (ZESTRIL) 20 MG tablet Take 1 tablet (20 mg total) by mouth daily. 90 tablet 1   meloxicam (MOBIC) 7.5 MG tablet Take 1 tablet by mouth once daily 90 tablet 0   methocarbamol (ROBAXIN) 500 MG tablet TAKE 1 TABLET BY MOUTH AT BEDTIME 30 tablet 0   metoprolol tartrate (LOPRESSOR) 25 MG tablet Take 1 tablet (25 mg total) by mouth as directed. Take one  tablet 2 hours before your CT scan 1 tablet 0   Multiple Vitamins-Minerals (PRESERVISION AREDS) CAPS Take 1 capsule by mouth in the morning and at bedtime.     mupirocin ointment (BACTROBAN) 2 % Apply 1 Application topically as needed (for iritation).     nystatin cream (MYCOSTATIN) Apply 1 Application topically as needed for dry skin.     [START ON 03/14/2022] oxyCODONE-acetaminophen (PERCOCET) 7.5-325 MG tablet Take 1 tablet by mouth 3 (three) times daily as needed for severe pain. 90 tablet 0   oxyCODONE-acetaminophen (PERCOCET) 7.5-325 MG tablet Take 1 tablet by mouth 3 (three) times daily as needed for severe pain. 90 tablet 0   pantoprazole (PROTONIX) 40 MG tablet Take daily before supper for stomach pain 30 tablet 3   No current facility-administered medications on file prior to visit.    Allergies  Allergen Reactions   Cashew Nut Oil Hives   Dog Epithelium Hives and Itching   Sulfa Antibiotics Other (See Comments)    "blistering lips and mouth sores"     1. Hyperlipidemia -  Hyperlipidemia LDL goal <70 Assessment:  Given patient age, other other comorbidities (hypertension, obesity) and high CAC score (606) LDLc goal is <70 mg/dl  Patient does not recall the previously tried statin's name but the medication records shows trial of simvastatin mg, rosuvastatin 10 mg and atorvastatin 40 mg  Patient recalls experiencing pain while taking atorvastatin but pain did not improved upon discontinuation of the statin  Patient has other conditions that is responsible for chronic pain such as osteoarthritis and chronic back pain. After discussing the risk and benefits of statins patient is in agreement to try statin again.  To achieve goal it is reasonable to try high intensity statin if not tolerated well will reduce the dose  Plan: Start taking atorvastatin 40 mg by mouth daily  PharmD will follow up in 2 weeks via phone to assess the tolerability and adjust the dose if needed.  Patient  to go for fasting lipid lab in 12 weeks (Jan10,2024)    Thank you,  Cammy Copa, Pharm.D [facility] [address], [place], [place] [address]  Phone: [phone]; Fax: [phone]

## 2022-02-23 ENCOUNTER — Ambulatory Visit: Payer: Medicare HMO | Attending: Cardiology | Admitting: Student

## 2022-02-23 ENCOUNTER — Encounter: Payer: Self-pay | Admitting: Student

## 2022-02-23 DIAGNOSIS — E785 Hyperlipidemia, unspecified: Secondary | ICD-10-CM | POA: Diagnosis not present

## 2022-02-23 MED ORDER — ATORVASTATIN CALCIUM 40 MG PO TABS: 40.0000 mg | ORAL_TABLET | Freq: Every day | ORAL | 3 refills | Status: DC

## 2022-02-23 NOTE — Patient Instructions (Signed)
Changes made by your pharmacist Cammy Copa, PharmD at today's visit:    Instructions/Changes  (what do you need to do) Your Notes  (what you did and when you did it)  Start taking Atorvastatin 40 mg by mouth daily     I will call you in 2 weeks to check how are you tolerating the change.   If you have any questions or concerns please use My Chart to send questions or call the office at [phone]

## 2022-02-23 NOTE — Assessment & Plan Note (Signed)
Assessment:   Given patient age, other other comorbidities (hypertension, obesity) and high CAC score (606) LDLc goal is <70 mg/dl   Patient does not recall the previously tried statin's name but the medication records shows trial of simvastatin mg, rosuvastatin 10 mg and atorvastatin 40 mg   Patient recalls experiencing pain while taking atorvastatin but pain did not improved upon discontinuation of the statin   Patient has other conditions that is responsible for chronic pain such as osteoarthritis and chronic back pain.  After discussing the risk and benefits of statins patient is in agreement to try statin again.   To achieve goal it is reasonable to try high intensity statin if not tolerated well will reduce the dose  Plan:  Start taking atorvastatin 40 mg by mouth daily   PharmD will follow up in 2 weeks via phone to assess the tolerability and adjust the dose if needed.   Patient to go for fasting lipid lab in 12 weeks ([id number])

## 2022-03-01 ENCOUNTER — Ambulatory Visit (INDEPENDENT_AMBULATORY_CARE_PROVIDER_SITE_OTHER): Payer: Medicare HMO | Admitting: Nurse Practitioner

## 2022-03-01 ENCOUNTER — Encounter: Payer: Self-pay | Admitting: Nurse Practitioner

## 2022-03-01 VITALS — BP 143/65 | HR 57 | Temp 98.0°F | Resp 20 | Ht 60.0 in | Wt 178.0 lb

## 2022-03-01 DIAGNOSIS — Z23 Encounter for immunization: Secondary | ICD-10-CM

## 2022-03-01 DIAGNOSIS — G8929 Other chronic pain: Secondary | ICD-10-CM

## 2022-03-01 DIAGNOSIS — M546 Pain in thoracic spine: Secondary | ICD-10-CM | POA: Diagnosis not present

## 2022-03-01 DIAGNOSIS — G629 Polyneuropathy, unspecified: Secondary | ICD-10-CM | POA: Diagnosis not present

## 2022-03-01 MED ORDER — OXYCODONE-ACETAMINOPHEN 7.5-325 MG PO TABS: 1.0000 | ORAL_TABLET | Freq: Three times a day (TID) | ORAL | 0 refills | Status: DC | PRN

## 2022-03-01 MED ORDER — OXYCODONE-ACETAMINOPHEN 7.5-325 MG PO TABS: 1.0000 | ORAL_TABLET | Freq: Three times a day (TID) | ORAL | 0 refills | Status: AC | PRN

## 2022-03-01 NOTE — Progress Notes (Signed)
Subjective:    Patient ID: [name], female    DOB: 07-25-1945, 76 y.o.   MRN: [id number]   Chief Complaint: Pain Management   HPI Pain assessment: Cause of pain- DDD Pain location- thoracic back Pain on scale of 1-10- 8/10 currently Frequency- daily What increases pain- nothing really What makes pain Better-rest helps Effects on ADL - none Any change in general medical condition-none  Current opioids rx- percocet 7.5/325 # meds rx- 90 Effectiveness of current meds-helps Adverse reactions from pain meds-none Morphine equivalent- 30MME  Pill count performed-No Last drug screen - 03/09/21 ( high risk q49m moderate risk q623mlow risk yearly ) Urine drug screen today- No [place]- yes  If yes were their any concerning findings? - no   Overdose risk: 03/09/21    09/15/2020   10:41 AM  Opioid Risk   Alcohol 0  Illegal Drugs 0  Rx Drugs 0  Alcohol 0  Illegal Drugs 0  Rx Drugs 0  Age between 16-45 years  0  History of Preadolescent Sexual Abuse 0  Psychological Disease 0  Depression 0  Opioid Risk Tool Scoring 0  Opioid Risk Interpretation Low Risk     Review of Systems  Constitutional:  Negative for diaphoresis.  Eyes:  Negative for pain.  Respiratory:  Negative for shortness of breath.   Cardiovascular:  Negative for chest pain, palpitations and leg swelling.  Gastrointestinal:  Negative for abdominal pain.  Endocrine: Negative for polydipsia.  Musculoskeletal:  Positive for back pain.  Skin:  Negative for rash.  Neurological:  Negative for dizziness, weakness and headaches.  Hematological:  Does not bruise/bleed easily.  All other systems reviewed and are negative.      Objective:   Physical Exam Vitals and nursing note reviewed.  Constitutional:      General: She is not in acute distress.    Appearance: Normal appearance. She is well-developed.  Neck:     Vascular: No carotid bruit or JVD.  Cardiovascular:     Rate and  Rhythm: Normal rate and regular rhythm.     Heart sounds: Normal heart sounds.  Pulmonary:     Effort: Pulmonary effort is normal. No respiratory distress.     Breath sounds: Normal breath sounds. No wheezing or rales.  Chest:     Chest wall: No tenderness.  Abdominal:     General: Bowel sounds are normal. There is no distension or abdominal bruit.     Palpations: Abdomen is soft. There is no hepatomegaly, splenomegaly, mass or pulsatile mass.     Tenderness: There is no abdominal tenderness.  Musculoskeletal:        General: Normal range of motion.     Cervical back: Normal range of motion and neck supple.  Lymphadenopathy:     Cervical: No cervical adenopathy.  Skin:    General: Skin is warm and dry.  Neurological:     Mental Status: She is alert and oriented to person, place, and time.     Deep Tendon Reflexes: Reflexes are normal and symmetric.  Psychiatric:        Behavior: Behavior normal.        Thought Content: Thought content normal.        Judgment: Judgment normal.    BP (!) 143/65   Pulse (!) 57   Temp 98 F (36.7 C) (Temporal)   Resp 20   Ht 5' (1.524 m)   Wt 178 lb (80.7 kg)   SpO2  99%   BMI 34.76 kg/m         Assessment & Plan:   TAISIA FANTINI in today with chief complaint of Pain Management   1. Peripheral polyneuropathy 2. Chronic midline thoracic back pain Follow up in 3 months - oxyCODONE-acetaminophen (PERCOCET) 7.5-325 MG tablet; Take 1 tablet by mouth 3 (three) times daily as needed for severe pain.  Dispense: 90 tablet; Refill: 0 - oxyCODONE-acetaminophen (PERCOCET) 7.5-325 MG tablet; Take 1 tablet by mouth 3 (three) times daily as needed for severe pain.  Dispense: 90 tablet; Refill: 0 - oxyCODONE-acetaminophen (PERCOCET) 7.5-325 MG tablet; Take 1 tablet by mouth 3 (three) times daily as needed for severe pain.  Dispense: 90 tablet; Refill: 0    The above assessment and management plan was discussed with the patient. The patient  verbalized understanding of and has agreed to the management plan. Patient is aware to call the clinic if symptoms persist or worsen. Patient is aware when to return to the clinic for a follow-up visit. Patient educated on when it is appropriate to go to the emergency department.   Mary-Margaret Hassell Done, FNP

## 2022-03-01 NOTE — Patient Instructions (Signed)

## 2022-03-02 DIAGNOSIS — M546 Pain in thoracic spine: Secondary | ICD-10-CM | POA: Diagnosis not present

## 2022-03-02 DIAGNOSIS — G8929 Other chronic pain: Secondary | ICD-10-CM | POA: Diagnosis not present

## 2022-03-02 DIAGNOSIS — Z23 Encounter for immunization: Secondary | ICD-10-CM | POA: Diagnosis not present

## 2022-03-02 DIAGNOSIS — G629 Polyneuropathy, unspecified: Secondary | ICD-10-CM | POA: Diagnosis not present

## 2022-03-07 ENCOUNTER — Telehealth: Payer: Self-pay | Admitting: Nurse Practitioner

## 2022-03-07 DIAGNOSIS — G8929 Other chronic pain: Secondary | ICD-10-CM

## 2022-03-07 MED ORDER — OXYCODONE-ACETAMINOPHEN 7.5-325 MG PO TABS: 1.0000 | ORAL_TABLET | Freq: Three times a day (TID) | ORAL | 0 refills | Status: AC | PRN

## 2022-03-07 NOTE — Telephone Encounter (Signed)
Meds ordered this encounter  Medications   oxyCODONE-acetaminophen (PERCOCET) 7.5-325 MG tablet    Sig: Take 1 tablet by mouth 3 (three) times daily as needed for severe pain.    Dispense:  90 tablet    Refill:  0    Fill 05/20/2019    Order Specific Question:   Supervising Provider    Answer:   Caryl Pina A [[id number]]   [place], FNP

## 2022-03-08 ENCOUNTER — Ambulatory Visit (INDEPENDENT_AMBULATORY_CARE_PROVIDER_SITE_OTHER): Payer: Medicare HMO | Admitting: Gastroenterology

## 2022-03-08 ENCOUNTER — Encounter: Payer: Self-pay | Admitting: Gastroenterology

## 2022-03-08 VITALS — BP 120/70 | HR 85 | Ht 60.0 in | Wt 181.0 lb

## 2022-03-08 DIAGNOSIS — R1084 Generalized abdominal pain: Secondary | ICD-10-CM | POA: Diagnosis not present

## 2022-03-08 DIAGNOSIS — T402X5A Adverse effect of other opioids, initial encounter: Secondary | ICD-10-CM

## 2022-03-08 DIAGNOSIS — R6881 Early satiety: Secondary | ICD-10-CM

## 2022-03-08 DIAGNOSIS — K5903 Drug induced constipation: Secondary | ICD-10-CM | POA: Diagnosis not present

## 2022-03-08 DIAGNOSIS — R112 Nausea with vomiting, unspecified: Secondary | ICD-10-CM

## 2022-03-08 NOTE — Progress Notes (Signed)
[facility] Progress Note:  History: [name] 03/08/2022  Referring provider: Chevis Pretty, FNP  Reason for consult/chief complaint: Abdominal Pain (Pt reports abdominal pain has resolved with Protonix)   Subjective  HPI: [name] follows up after last office visit 12/16/2021, at which time she was seen for chronic generalized abdominal pain, opioid-induced constipation and suspected delayed gastric emptying.  KUB and CT scan done shortly afterward.  Samples of Linzess given.  In my addendum to the note, I recommended consideration of Movantik rather than metoclopramide for the upper digestive symptoms.  She was also started on scheduled pantoprazole.  [name] is glad to report that on once daily pantoprazole she is considerably improved.  Her generalized abdominal pain is better, she only has early satiety or nausea.  Her appetite is good.  She was not previously having feelings of heartburn and regurgitation and is not at this time either.  Senokot 2-3 times a week seems to help move her bowels just as well as the Linzess samples daily.   ROS:  Review of Systems Chronic musculoskeletal pain Denies chest pain or dyspnea  Past Medical History: Past Medical History:  Diagnosis Date   Arthritis    Cancer ([place])    ovarian,skin Ca. in mouth   Cataract    Bil cataracts removed   Chronic constipation    Chronic kidney disease    Chronic midline low back pain with sciatica    Colon polyp    large ascending colon polyp , scheduled for resection 02/ 2021   Full dentures    History of endometrial cancer 2012   s/p   TAH w/ BSO 11-02-2010   History of recurrent UTIs    History of sepsis    multiple times (some due to e.coli) last sepsis 07/ 2019   History of small bowel obstruction    x2  ,  hx bowel resection   HOH (hard of hearing)    Hyperlipidemia    Hypertension    Retroperitoneal fibrosis    Substance abuse ([place])    opiod  dependancy   Ureteral obstruction, left urologist-- dr Alinda Money   secondary to adhesions/ retroperitoneal fibrosis, treated with ureteral stent   Vertigo    Wears glasses      Past Surgical History: Past Surgical History:  Procedure Laterality Date   ABDOMINAL HYSTERECTOMY  11/02/10  dr Alycia Rossetti  '@[facility]'$    TAH, BSO, incisional hernia repair , lysis of adhesions for endometrial cancer   ANTERIOR LUMBAR FUSION  01-28-2008   dr elsner  '@[facility]'$    L2 -- L4   BACK SURGERY     BUNIONECTOMY Right 2003   CATARACT EXTRACTION W/ INTRAOCULAR LENS  IMPLANT, BILATERAL  2007   COLONOSCOPY  last one 12-24-2018   CYSTO/  LEFT RETROGRADE PYELOGRAM/ URETEROSCOPY STENT PLACEMENT/  OPEN URETEROLYSIS WITH OMENTAL FLAP Left 03-11-2009    dr borden  '@[facility]'$    CYSTO/  [facility] SLING/  ANTERIOR REPAIR  12-06-2001    dr Jeffie Pollock '@[facility]'$    CYSTOSCOPY W/ URETERAL STENT PLACEMENT Left 12/07/2017   Procedure: CYSTOSCOPY WITH RETROGRADE PYELOGRAM/URETERAL STENT PLACEMENT;  Surgeon: Raynelle Bring, MD;  Location: [facility];  Service: Urology;  Laterality: Left;   CYSTOSCOPY W/ URETERAL STENT PLACEMENT Left 03/29/2018   Procedure: CYSTOSCOPY WITH STENT  EXCHANGE;  Surgeon: Raynelle Bring, MD;  Location: [facility];  Service: Urology;  Laterality: Left;   CYSTOSCOPY WITH RETROGRADE PYELOGRAM, URETEROSCOPY AND STENT PLACEMENT Left 12-29-2008   dr Alinda Money  '@[facility]'$   WITH BALLOON DILATION LEFT URETERAL STRICTURE   CYSTOSCOPY WITH RETROGRADE PYELOGRAM, URETEROSCOPY AND STENT PLACEMENT Left 09-12-2008    dr Jeffie Pollock '@[facility]'$    CYSTOSCOPY WITH RETROGRADE PYELOGRAM, URETEROSCOPY AND STENT PLACEMENT Left 03/23/2020   Procedure: CYSTOSCOPY WITH LEFT RETROGRADE PYELOGRAM, AND LEFT STENT REMOVAL;  Surgeon: Raynelle Bring, MD;  Location: [facility];  Service: Urology;  Laterality: Left;   CYSTOSCOPY WITH STENT PLACEMENT Left 07/26/2018   Procedure: CYSTOSCOPY WITH STENT CHANGE;  Surgeon: Raynelle Bring, MD;  Location: [facility];  Service: Urology;  Laterality: Left;   CYSTOSCOPY  WITH STENT PLACEMENT Left 12/03/2018   Procedure: CYSTOSCOPY WITH STENT CHANGE;  Surgeon: Raynelle Bring, MD;  Location: [facility];  Service: Urology;  Laterality: Left;   CYSTOSCOPY WITH STENT PLACEMENT Left 04/22/2019   Procedure: CYSTOSCOPY WITH STENT CHANGE;  Surgeon: Raynelle Bring, MD;  Location: [facility];  Service: Urology;  Laterality: Left;   CYSTOSCOPY WITH STENT PLACEMENT Left 10/03/2019   Procedure: CYSTOSCOPY WITH STENT CHANGE;  Surgeon: Raynelle Bring, MD;  Location: [facility];  Service: Urology;  Laterality: Left;   EYE SURGERY     FOOT SURGERY Left 06-23-2010   dr Beola Cord   left first and second toes   INSERTION OF MESH N/A 04/23/2013   Procedure: INSERTION OF MESH;  Surgeon: Adin Hector, MD;  Location: [facility];  Service: General;  Laterality: N/A;   JOINT REPLACEMENT     KNEE ARTHROSCOPY Right 04-27-2007  dr Shellia Carwin '@[facility]'$    LAPAROSCOPIC LYSIS OF ADHESIONS N/A 04/23/2013   Procedure: LAPAROSCOPIC LYSIS OF ADHESIONS;  Surgeon: Adin Hector, MD;  Location: [facility];  Service: General;  Laterality: N/A;   [place] and [place]  12/17/2012   L3 -- L5 laminectomy and L3 -- 5 fusion   TOTAL HIP ARTHROPLASTY Left 07/04/2014   Procedure: LEFT TOTAL HIP ARTHROPLASTY ANTERIOR APPROACH;  Surgeon: Gearlean Alf, MD;  Location: [place];  Service: Orthopedics;  Laterality: Left;   TOTAL HIP ARTHROPLASTY Right 2009   TOTAL KNEE ARTHROPLASTY Right 09-15-2009   dr Shellia Carwin '@[facility]'$    VENTRAL HERNIA REPAIR N/A 07/19/2012   Procedure: LAPAROSCOPIC LYSIS OF ADHESIONS, SMALL BOWEL RESECTION, SEROSAL REPAIR, PRIMARY VENTRAL HERNIA REPAIR;  Surgeon: Adin Hector, MD;  Location[facility];  Service: General;  Laterality: N/A;   VENTRAL HERNIA REPAIR N/A 04/23/2013   Procedure: LAPAROSCOPIC exploration and repair of hernia in abdominal VENTRAL wall  HERNIA;  Surgeon: Adin Hector, MD;  Location: [facility];  Service: General;  Laterality: N/A;      Family History: Family History  Problem Relation Age of Onset   Diabetes Mother    Lung cancer Mother    Diabetes Father    Liver cancer Father    Colon cancer Sister        close to 28 per pt   Diabetes Sister    Diabetes Sister    Hypertension Sister    Kidney disease Sister    Seizures Daughter    Diabetes Son    Breast cancer Neg Hx    Allergic rhinitis Neg Hx    Angioedema Neg Hx    Asthma Neg Hx    Atopy Neg Hx    Eczema Neg Hx    Immunodeficiency Neg Hx    Urticaria Neg Hx    Colon polyps Neg Hx    Esophageal cancer Neg Hx    Rectal cancer Neg Hx    Stomach cancer  Neg Hx     Social History: Social History   Socioeconomic History   Marital status: Single    Spouse name: Not on file   Number of children: 3   Years of education: 12   Highest education level: High school graduate  Occupational History   Occupation: Retired  Tobacco Use   Smoking status: Never   Smokeless tobacco: Never  Vaping Use   Vaping Use: Never used  Substance and Sexual Activity   Alcohol use: No   Drug use: No   Sexual activity: Not Currently    Birth control/protection: Surgical  Other Topics Concern   Not on file  Social History Narrative   Her son and DIL live with her   They help with bills   Social Determinants of Health   Financial Resource Strain: Low Risk  (06/21/2021)   Overall Financial Resource Strain (CARDIA)    Difficulty of Paying Living Expenses: Not hard at all  Food Insecurity: No Food Insecurity (06/21/2021)   Hunger Vital Sign    Worried About Running Out of Food in the Last Year: Never true    [place] in the Last Year: Never true  Transportation Needs: No Transportation Needs (06/21/2021)   PRAPARE - Hydrologist (Medical): No    Lack of Transportation (Non-Medical): No  Physical Activity: Insufficiently Active (06/21/2021)   Exercise Vital Sign    Days of Exercise per Week: 7 days    Minutes of Exercise per  Session: 20 min  Stress: No Stress Concern Present (06/21/2021)   [place]    Feeling of Stress : Not at all  Social Connections: Moderately Integrated (06/21/2021)   Social Connection and Isolation Panel [NHANES]    Frequency of Communication with Friends and Family: More than three times a week    Frequency of Social Gatherings with Friends and Family: More than three times a week    Attends Religious Services: More than 4 times per year    Active Member of [employer] or Organizations: Yes    Attends Archivist Meetings: More than 4 times per year    Marital Status: Widowed    Allergies: Allergies  Allergen Reactions   Cashew Nut Oil Hives   Dog Epithelium Hives and Itching   Sulfa Antibiotics Other (See Comments)    "blistering lips and mouth sores"    Outpatient Meds: Current Outpatient Medications  Medication Sig Dispense Refill   amitriptyline (ELAVIL) 25 MG tablet Take 1 tablet (25 mg total) by mouth at bedtime. 90 tablet 1   aspirin EC 81 MG tablet Take 81 mg by mouth daily. Swallow whole.     atorvastatin (LIPITOR) 40 MG tablet Take 1 tablet (40 mg total) by mouth daily. 90 tablet 3   furosemide (LASIX) 20 MG tablet Take 1 tablet (20 mg total) by mouth daily. 90 tablet 1   lisinopril (ZESTRIL) 20 MG tablet Take 1 tablet (20 mg total) by mouth daily. 90 tablet 1   meloxicam (MOBIC) 7.5 MG tablet Take 1 tablet by mouth once daily 90 tablet 0   methocarbamol (ROBAXIN) 500 MG tablet TAKE 1 TABLET BY MOUTH AT BEDTIME 30 tablet 0   metoprolol tartrate (LOPRESSOR) 25 MG tablet Take 1 tablet (25 mg total) by mouth as directed. Take one tablet 2 hours before your CT scan 1 tablet 0   Multiple Vitamins-Minerals (PRESERVISION AREDS) CAPS Take 1 capsule by  mouth in the morning and at bedtime.     mupirocin ointment (BACTROBAN) 2 % Apply 1 Application topically as needed (for iritation).     nystatin cream  (MYCOSTATIN) Apply 1 Application topically as needed for dry skin.     [START ON 05/13/2022] oxyCODONE-acetaminophen (PERCOCET) 7.5-325 MG tablet Take 1 tablet by mouth 3 (three) times daily as needed for severe pain. 90 tablet 0   [START ON 04/13/2022] oxyCODONE-acetaminophen (PERCOCET) 7.5-325 MG tablet Take 1 tablet by mouth 3 (three) times daily as needed for severe pain. 90 tablet 0   [START ON 03/11/2022] oxyCODONE-acetaminophen (PERCOCET) 7.5-325 MG tablet Take 1 tablet by mouth 3 (three) times daily as needed for severe pain. 90 tablet 0   pantoprazole (PROTONIX) 40 MG tablet Take daily before supper for stomach pain 30 tablet 3   trimethoprim (TRIMPEX) 100 MG tablet Take 100 mg by mouth at bedtime.     No current facility-administered medications for this visit.      ___________________________________________________________________ Objective   Exam:  BP 120/70   Pulse 85   Ht 5' (1.524 m)   Wt 181 lb (82.1 kg)   BMI 35.35 kg/m  Wt Readings from Last 3 Encounters:  03/08/22 181 lb (82.1 kg)  03/01/22 178 lb (80.7 kg)  01/04/22 182 lb (82.6 kg)   Daughter present for entire visit General: Antalgic gait, well-appearing  CV: Regular without murmur, no JVD, no peripheral edema Resp: clear to auscultation bilaterally, normal RR and effort noted GI: soft, no tenderness, with active bowel sounds. No guarding or palpable organomegaly noted.  Obese Skin; warm and dry, no rash or jaundice noted Neuro: awake, alert and oriented x 3. Normal gross motor function and fluent speech  Radiologic Studies:  CLINICAL DATA:  Concern for bowel obstruction. Patient has abdominal pain, nausea vomiting. History of small-bowel obstruction.   EXAM: CT ABDOMEN AND PELVIS WITH CONTRAST   TECHNIQUE: Multidetector CT imaging of the abdomen and pelvis was performed using the standard protocol following bolus administration of intravenous contrast.   RADIATION DOSE REDUCTION: This exam was  performed according to the departmental dose-optimization program which includes automated exposure control, adjustment of the mA and/or kV according to patient size and/or use of iterative reconstruction technique.   CONTRAST:  191m OMNIPAQUE IOHEXOL 300 MG/ML  SOLN   COMPARISON:  CT abdomen pelvis dated 07/22/2021.   FINDINGS: Lower chest: Mild bibasilar atelectasis.   Hepatobiliary: No focal liver abnormality is seen. No gallstones, gallbladder wall thickening, or biliary dilatation.   Pancreas: Unremarkable. No pancreatic ductal dilatation or surrounding inflammatory changes.   Spleen: Normal in size without focal abnormality.   Adrenals/Urinary Tract: Adrenal glands are unremarkable. The left kidney is atrophic. The right kidney appears normal, without renal calculi, focal lesion, or hydronephrosis. Bladder is obscured by streak artifact from bilateral hip arthroplasties.   Stomach/Bowel: Stomach is within normal limits. Enteric contrast reaches the descending colon. The patient is status post a right hemicolectomy. No evidence of bowel wall thickening, distention, or inflammatory changes. No evidence of bowel obstruction.   Vascular/Lymphatic: Aortic atherosclerosis. No enlarged abdominal or pelvic lymph nodes.   Reproductive: Status post hysterectomy. No adnexal masses.   Other: A 4.0 x 1.9 cm area of mild fat stranding in the right hemiabdomen may represent an omental infarct (series 2, image 48). No free intraperitoneal fluid is identified. No abdominal wall hernia.   Musculoskeletal: Bilateral hip arthroplasties and spinal fixation hardware from L2-L4 are redemonstrated. No acute osseous injury. Degenerative  changes are seen in the spine.   IMPRESSION: 1. Mild fat stranding in the right hemiabdomen may represent omental infarct. 2. No evidence of bowel obstruction.   Aortic Atherosclerosis (ICD10-I70.0).     Electronically Signed   By: Zerita Boers  M.D.   On: 12/30/2021 20:45  __________________________________   CLINICAL DATA:  LEFT lower quadrant pain.   EXAM: CT ABDOMEN AND PELVIS WITH CONTRAST   TECHNIQUE: Multidetector CT imaging of the abdomen and pelvis was performed using the standard protocol following bolus administration of intravenous contrast.   RADIATION DOSE REDUCTION: This exam was performed according to the departmental dose-optimization program which includes automated exposure control, adjustment of the mA and/or kV according to patient size and/or use of iterative reconstruction technique.   CONTRAST:  129m OMNIPAQUE IOHEXOL 300 MG/ML  SOLN   COMPARISON:  CT 11/20/2020   FINDINGS: Lower chest: Lung bases are clear.   Hepatobiliary: No focal hepatic lesion. No biliary duct dilatation. Common bile duct is normal.   Pancreas: Pancreas is normal. No ductal dilatation. No pancreatic inflammation.   Spleen: Normal spleen   Adrenals/urinary tract: Adrenal glands normal. LEFT kidney is atrophic. Compensatory hypertrophy of the RIGHT kidney. No hydroureter. Evaluation of the bladder limited by significant streak artifact from the bilateral hip prosthetics.   Stomach/Bowel: Stomach and duodenum normal. There is small bowel anastomosis in the RIGHT abdomen. Post RIGHT hemicolectomy anatomy. No evidence of bowel obstruction or inflammation. LEFT colon is normal.   Vascular/Lymphatic: Abdominal aorta is normal caliber with atherosclerotic calcification. There is no retroperitoneal or periportal lymphadenopathy. No pelvic lymphadenopathy.   Reproductive: Post hysterectomy.  Adnexa unremarkable   Other: No abdominal hernia.   Musculoskeletal: Bilateral hip prosthetics. Posterior lumbar fusion. Acute osseous findings.   IMPRESSION: 1. Chronic hydronephrosis of the LEFT kidney with chronic cortical scarring and atrophy. Obstruction apparently at the UPJ level. No distal hydroureter. 2.  Compensatory hypertrophy of the RIGHT kidney. No obstruction or abnormality. 3. Bladder is poorly evaluated due to streak artifact from the bilateral hip prosthetics. No gross abnormality. 4. Post RIGHT hemicolectomy anatomy. No acute inflammation or obstruction of the bowel.     Electronically Signed   By: SSuzy BouchardM.D.   On: 07/22/2021 17:25  _________________________________________  CLINICAL DATA:  evaluate stool burden and for obstruction. Lower abd pain, nausea, gas/bloating AND constipation, 3-4 wks, Hx of SBO.   EXAM: ABDOMEN - 1 VIEW   COMPARISON:  CT abdomen pelvis 07/22/2021   FINDINGS: Mid abdomen loop of bowel distended likely represents colon. No increased stool burden identified. The bowel gas pattern is normal. Bowel anastomotic surgical staples noted. No radio-opaque calculi or other significant radiographic abnormality are seen.   Bilateral total hip arthroplasties partially visualized. L3-L5 vertebral body surgical hardware.   IMPRESSION: Nonobstructive bowel gas pattern.     Electronically Signed   By: MIven FinnM.D.   On: 12/16/2021 15:14 __________  Cardiac CT from 12/23/2021 status calcified coronary disease that was felt to be nonobstructing, no cardiac planned.  Assessment: Encounter Diagnoses  Name Primary?   Generalized abdominal pain Yes   Therapeutic opioid induced constipation    Nausea and vomiting in adult    Early satiety     All her symptoms are considerably improved, more than would be expected from once daily pantoprazole.  Yield of an upper endoscopy seems low at this point. She has opioid-induced constipation and likely intermittent early satiety and nausea related to that.  There could be an  element of delayed gastric emptying as well.  Difficult to say if there is any reflux contributing to her chest pain, she does not describe classic heartburn or regurgitation.  Plan:  She is happy using Senokot 2 or 3  times a week to relieve constipation.  She did find the Linzess helpful to take as needed if that regimen was not working so well, so I gave her some additional samples.  She because of need for prescription.  Decrease pantoprazole to 1 tablet every other day.  Return to see [place] as needed.  Thank you for the courtesy of this consult.  Please call me with any questions or concerns.  Nelida Meuse III

## 2022-03-08 NOTE — Patient Instructions (Signed)
_______________________________________________________  If you are age 77 or older, your body mass index should be between 23-30. Your Body mass index is 35.35 kg/m. If this is out of the aforementioned range listed, please consider follow up with your Primary Care Provider.  If you are age 90 or younger, your body mass index should be between 19-25. Your Body mass index is 35.35 kg/m. If this is out of the aformentioned range listed, please consider follow up with your Primary Care Provider.   ________________________________________________________  The [place] [facility] providers would like to encourage you to use [facility] to communicate with providers for non-urgent requests or questions.  Due to long hold times on the telephone, sending your provider a message by [facility] may be a faster and more efficient way to get a response.  Please allow 48 business hours for a response.  Please remember that this is for non-urgent requests.  _______________________________________________________  Medication Samples have been provided to the patient.  Drug name: Linzess       Strength: 175mg        Qty: 12  LOT:: [id number] Exp.Date: 11-2022  Dosing instructions: as directed by Dr. DLoletha Carrow The patient has been instructed regarding the correct time, dose, and frequency of taking this medication, including desired effects and most common side effects.   Linzess works best when taken once a day every day, on an empty stomach, at least 30 minutes before your first meal of the day.  When Linzess is taken daily as directed:  *Constipation relief is typically felt in about a week *IBS-C patients may begin to experience relief from belly pain and overall abdominal symptoms (pain, discomfort, and bloating) in about 1 week,   with symptoms typically improving over 12 weeks.  Diarrhea may occur in the first 2 weeks -keep taking it.  The diarrhea should go away and you should start having normal, complete, full bowel  movements. It may be helpful to start treatment when you can be near the comfort of your own bathroom, such as a weekend.    It was a pleasure to see you today!  Thank you for trusting me with your gastrointestinal care!

## 2022-03-17 ENCOUNTER — Telehealth: Payer: Self-pay | Admitting: Pharmacist

## 2022-03-17 NOTE — Telephone Encounter (Signed)
Call to see how patient is tolerating atorvastatin 40 mg daily that she restarted taking 2 weeks ago. Patient reports she is tolerating it well without any side effects.  Encouraged patient to continue taking and go for scheduled lipid lab in 12 weeks.

## 2022-04-14 DIAGNOSIS — M7062 Trochanteric bursitis, left hip: Secondary | ICD-10-CM | POA: Diagnosis not present

## 2022-04-14 DIAGNOSIS — M7061 Trochanteric bursitis, right hip: Secondary | ICD-10-CM | POA: Diagnosis not present

## 2022-04-18 ENCOUNTER — Other Ambulatory Visit: Payer: Self-pay | Admitting: Nurse Practitioner

## 2022-04-18 DIAGNOSIS — Z1231 Encounter for screening mammogram for malignant neoplasm of breast: Secondary | ICD-10-CM

## 2022-04-22 ENCOUNTER — Other Ambulatory Visit: Payer: Self-pay | Admitting: Nurse Practitioner

## 2022-04-22 DIAGNOSIS — G8929 Other chronic pain: Secondary | ICD-10-CM

## 2022-05-18 ENCOUNTER — Other Ambulatory Visit: Payer: Medicare HMO

## 2022-05-23 ENCOUNTER — Telehealth: Payer: Self-pay | Admitting: Pharmacist

## 2022-05-24 DIAGNOSIS — H10811 Pingueculitis, right eye: Secondary | ICD-10-CM | POA: Diagnosis not present

## 2022-05-31 ENCOUNTER — Other Ambulatory Visit: Payer: Self-pay | Admitting: Physician Assistant

## 2022-06-02 ENCOUNTER — Ambulatory Visit: Payer: Medicare HMO | Admitting: Nurse Practitioner

## 2022-06-13 ENCOUNTER — Ambulatory Visit (INDEPENDENT_AMBULATORY_CARE_PROVIDER_SITE_OTHER): Payer: Medicare HMO | Admitting: Nurse Practitioner

## 2022-06-13 ENCOUNTER — Ambulatory Visit: Payer: Medicare HMO

## 2022-06-13 ENCOUNTER — Encounter: Payer: Self-pay | Admitting: Nurse Practitioner

## 2022-06-13 VITALS — BP 132/72 | HR 79 | Temp 97.5°F | Resp 20 | Ht 60.0 in | Wt 170.0 lb

## 2022-06-13 DIAGNOSIS — G8929 Other chronic pain: Secondary | ICD-10-CM | POA: Diagnosis not present

## 2022-06-13 DIAGNOSIS — Z6833 Body mass index (BMI) 33.0-33.9, adult: Secondary | ICD-10-CM

## 2022-06-13 DIAGNOSIS — E669 Obesity, unspecified: Secondary | ICD-10-CM

## 2022-06-13 DIAGNOSIS — G629 Polyneuropathy, unspecified: Secondary | ICD-10-CM | POA: Diagnosis not present

## 2022-06-13 DIAGNOSIS — E785 Hyperlipidemia, unspecified: Secondary | ICD-10-CM | POA: Diagnosis not present

## 2022-06-13 DIAGNOSIS — R609 Edema, unspecified: Secondary | ICD-10-CM

## 2022-06-13 DIAGNOSIS — M546 Pain in thoracic spine: Secondary | ICD-10-CM | POA: Diagnosis not present

## 2022-06-13 DIAGNOSIS — R69 Illness, unspecified: Secondary | ICD-10-CM | POA: Diagnosis not present

## 2022-06-13 DIAGNOSIS — I10 Essential (primary) hypertension: Secondary | ICD-10-CM

## 2022-06-13 DIAGNOSIS — F3341 Major depressive disorder, recurrent, in partial remission: Secondary | ICD-10-CM

## 2022-06-13 LAB — CMP14+EGFR
ALT: 25 IU/L (ref 0–32)
AST: 27 IU/L (ref 0–40)
Albumin/Globulin Ratio: 1.7 (ref 1.2–2.2)
Albumin: 4.4 g/dL (ref 3.8–4.8)
Alkaline Phosphatase: 69 IU/L (ref 44–121)
BUN/Creatinine Ratio: 12 (ref 12–28)
BUN: 16 mg/dL (ref 8–27)
Bilirubin Total: 0.6 mg/dL (ref 0.0–1.2)
CO2: 20 mmol/L (ref 20–29)
Calcium: 9.4 mg/dL (ref 8.7–10.3)
Chloride: 104 mmol/L (ref 96–106)
Creatinine, Ser: 1.35 mg/dL — ABNORMAL HIGH (ref 0.57–1.00)
Globulin, Total: 2.6 g/dL (ref 1.5–4.5)
Glucose: 83 mg/dL (ref 70–99)
Potassium: 4.5 mmol/L (ref 3.5–5.2)
Sodium: 140 mmol/L (ref 134–144)
Total Protein: 7 g/dL (ref 6.0–8.5)
eGFR: 40 mL/min/{1.73_m2} — ABNORMAL LOW (ref >59)

## 2022-06-13 LAB — CBC WITH DIFFERENTIAL/PLATELET
Basophils Absolute: 0 10*3/uL (ref 0.0–0.2)
Basos: 0 %
EOS (ABSOLUTE): 0.2 10*3/uL (ref 0.0–0.4)
Eos: 2 %
Hematocrit: 38.9 % (ref 34.0–46.6)
Hemoglobin: 13.4 g/dL (ref 11.1–15.9)
Immature Grans (Abs): 0 10*3/uL (ref 0.0–0.1)
Immature Granulocytes: 0 %
Lymphocytes Absolute: 1.5 10*3/uL (ref 0.7–3.1)
Lymphs: 18 %
MCH: 32.6 pg (ref 26.6–33.0)
MCHC: 34.4 g/dL (ref 31.5–35.7)
MCV: 95 fL (ref 79–97)
Monocytes Absolute: 0.5 10*3/uL (ref 0.1–0.9)
Monocytes: 7 %
Neutrophils Absolute: 5.9 10*3/uL (ref 1.4–7.0)
Neutrophils: 73 %
Platelets: 286 10*3/uL (ref 150–450)
RBC: 4.11 x10E6/uL (ref 3.77–5.28)
RDW: 12.2 % (ref 11.7–15.4)
WBC: 8.1 10*3/uL (ref 3.4–10.8)

## 2022-06-13 LAB — LIPID PANEL
Chol/HDL Ratio: 1.8 ratio (ref 0.0–4.4)
Cholesterol, Total: 155 mg/dL (ref 100–199)
HDL: 88 mg/dL (ref >39)
LDL Chol Calc (NIH): 51 mg/dL (ref 0–99)
Triglycerides: 85 mg/dL (ref 0–149)
VLDL Cholesterol Cal: 16 mg/dL (ref 5–40)

## 2022-06-13 MED ORDER — LISINOPRIL 20 MG PO TABS: 20.0000 mg | ORAL_TABLET | Freq: Every day | ORAL | 1 refills | Status: DC

## 2022-06-13 MED ORDER — PANTOPRAZOLE SODIUM 40 MG PO TBEC: DELAYED_RELEASE_TABLET | ORAL | 1 refills | Status: DC

## 2022-06-13 MED ORDER — OXYCODONE-ACETAMINOPHEN 7.5-325 MG PO TABS: 1.0000 | ORAL_TABLET | Freq: Three times a day (TID) | ORAL | 0 refills | Status: AC | PRN

## 2022-06-13 MED ORDER — METOPROLOL TARTRATE 25 MG PO TABS: 25.0000 mg | ORAL_TABLET | ORAL | 0 refills | Status: DC

## 2022-06-13 MED ORDER — FUROSEMIDE 20 MG PO TABS: 20.0000 mg | ORAL_TABLET | Freq: Every day | ORAL | 1 refills | Status: DC

## 2022-06-13 MED ORDER — ATORVASTATIN CALCIUM 40 MG PO TABS: 40.0000 mg | ORAL_TABLET | Freq: Every day | ORAL | 1 refills | Status: DC

## 2022-06-13 MED ORDER — OXYCODONE-ACETAMINOPHEN 7.5-325 MG PO TABS: 1.0000 | ORAL_TABLET | Freq: Three times a day (TID) | ORAL | 0 refills | Status: DC | PRN

## 2022-06-13 MED ORDER — AMITRIPTYLINE HCL 25 MG PO TABS: 25.0000 mg | ORAL_TABLET | Freq: Every day | ORAL | 1 refills | Status: DC

## 2022-06-13 NOTE — Patient Instructions (Signed)

## 2022-06-13 NOTE — Progress Notes (Signed)
Subjective:    Patient ID: [name], female    DOB: 1945/11/22, 77 y.o.   MRN: [id number]   Chief Complaint: Medical Management of Chronic Issues    HPI:  [name] is a 77 y.o. who identifies as a female who was assigned female at birth.   Social history: Lives with: lives with her son and daughter inlaw Work history: rtired   Comes in today for follow up of the following chronic medical issues:  1. Primary hypertension No c/o chset pain, sob or headache. Does not check blood pressure at home. BP Readings from Last 3 Encounters:  06/13/22 132/72  03/08/22 120/70  03/01/22 (!) 143/65     2. Peripheral polyneuropathy Has some burning in bil feet. Says not bothering her enugh to tak enay meds.  3. Peripheral edema Has by end of each day  4. Hyperlipidemia LDL goal <70 Doe snot really watch diet and does no dedicated exercise. Lab Results  Component Value Date   CHOL 218 (H) 11/30/2021   HDL 77 11/30/2021   LDLCALC 123 (H) 11/30/2021   TRIG 106 11/30/2021   CHOLHDL 2.8 11/30/2021     5. Recurrent major depressive disorder, in partial remission ([place]) is currently on no antidepressants and is dong well.    06/13/2022   10:06 AM 03/01/2022   11:02 AM 01/04/2022    9:31 AM  Depression screen PHQ 2/9  Decreased Interest 0 0 0  Down, Depressed, Hopeless 0 0 0  PHQ - 2 Score 0 0 0  Altered sleeping 0 0 1  Tired, decreased energy 0 1 1  Change in appetite 0 0 0  Feeling bad or failure about yourself  0 0 0  Trouble concentrating 0 0 0  Moving slowly or fidgety/restless 0 0 0  Suicidal thoughts 0 0 0  PHQ-9 Score 0 1 2  Difficult doing work/chores Not difficult at all Not difficult at all Not difficult at all     6. Chronic back pain Pain assessment: Cause of pain- DDD Pain location- lower back Pain on scale of 1-10- 4/10 currently Frequency- daily What increases pain-standing in one spot What makes pain Better-rest helps Effects on ADL  - none Any change in general medical condition-none  Current opioids rx- percocet 7.5/325 TID # meds rx- 90 Effectiveness of current meds-helps Adverse reactions from pain meds-none Morphine equivalent- 33.75MME  Pill count performed-No Last drug screen - 1 year ago ( high risk q38m moderate risk q623mlow risk yearly ) Urine drug screen today- Yes Was the [place]- yes  If yes were their any concerning findings? - none   Overdose risk: 1    09/15/2020   10:41 AM  Opioid Risk   Alcohol 0  Illegal Drugs 0  Rx Drugs 0  Alcohol 0  Illegal Drugs 0  Rx Drugs 0  Age between 16-45 years  0  History of Preadolescent Sexual Abuse 0  Psychological Disease 0  Depression 0  Opioid Risk Tool Scoring 0  Opioid Risk Interpretation Low Risk     Pain contract signed on:06/13/22    7. Obesity (BMI 30-39.9) Weight is [name] 11lbs  Wt Readings from Last 3 Encounters:  06/13/22 170 lb (77.1 kg)  03/08/22 181 lb (82.1 kg)  03/01/22 178 lb (80.7 kg)   BMI Readings from Last 3 Encounters:  06/13/22 33.20 kg/m  03/08/22 35.35 kg/m  03/01/22 34.76 kg/m      New complaints: None today  Allergies  Allergen Reactions   Cashew Nut Oil Hives   Dog Epithelium Hives and Itching   Sulfa Antibiotics Other (See Comments)    "blistering lips and mouth sores"   Outpatient Encounter Medications as of 06/13/2022  Medication Sig   amitriptyline (ELAVIL) 25 MG tablet Take 1 tablet (25 mg total) by mouth at bedtime.   atorvastatin (LIPITOR) 40 MG tablet Take 1 tablet (40 mg total) by mouth daily.   furosemide (LASIX) 20 MG tablet Take 1 tablet (20 mg total) by mouth daily.   lisinopril (ZESTRIL) 20 MG tablet Take 1 tablet (20 mg total) by mouth daily.   meloxicam (MOBIC) 7.5 MG tablet Take 1 tablet by mouth once daily   methocarbamol (ROBAXIN) 500 MG tablet TAKE 1 TABLET BY MOUTH AT BEDTIME   metoprolol tartrate (LOPRESSOR) 25 MG tablet Take 1 tablet (25 mg total) by mouth as  directed. Take one tablet 2 hours before your CT scan   mupirocin ointment (BACTROBAN) 2 % Apply 1 Application topically as needed (for iritation).   nystatin cream (MYCOSTATIN) Apply 1 Application topically as needed for dry skin.   pantoprazole (PROTONIX) 40 MG tablet TAKE 1 TABLET BY MOUTH ONCE DAILY BEFORE SUPPER FOR STOMACH PAIN   trimethoprim (TRIMPEX) 100 MG tablet Take 100 mg by mouth at bedtime.   [DISCONTINUED] aspirin EC 81 MG tablet Take 81 mg by mouth daily. Swallow whole.   [DISCONTINUED] Multiple Vitamins-Minerals (PRESERVISION AREDS) CAPS Take 1 capsule by mouth in the morning and at bedtime.   No facility-administered encounter medications on file as of 06/13/2022.    Past Surgical History:  Procedure Laterality Date   ABDOMINAL HYSTERECTOMY  11/02/10  dr Alycia Rossetti  '@[facility]    TAH, BSO, incisional hernia repair , lysis of adhesions for endometrial cancer   ANTERIOR LUMBAR FUSION  01-28-2008   dr elsner  '@[facility]'$    L2 -- L4   BACK SURGERY     BUNIONECTOMY Right 2003   CATARACT EXTRACTION W/ INTRAOCULAR LENS  IMPLANT, BILATERAL  2007   COLONOSCOPY  last one 12-24-2018   CYSTO/  LEFT RETROGRADE PYELOGRAM/ URETEROSCOPY STENT PLACEMENT/  OPEN URETEROLYSIS WITH OMENTAL FLAP Left 03-11-2009    dr borden  '@[facility]'$    CYSTO/  [facility] SLING/  ANTERIOR REPAIR  12-06-2001    dr Jeffie Pollock '@[facility]'$    CYSTOSCOPY W/ URETERAL STENT PLACEMENT Left 12/07/2017   Procedure: CYSTOSCOPY WITH RETROGRADE PYELOGRAM/URETERAL STENT PLACEMENT;  Surgeon: Raynelle Bring, MD;  Location: [facility];  Service: Urology;  Laterality: Left;   CYSTOSCOPY W/ URETERAL STENT PLACEMENT Left 03/29/2018   Procedure: CYSTOSCOPY WITH STENT  EXCHANGE;  Surgeon: Raynelle Bring, MD;  Location: [facility];  Service: Urology;  Laterality: Left;   CYSTOSCOPY WITH RETROGRADE PYELOGRAM, URETEROSCOPY AND STENT PLACEMENT Left 12-29-2008   dr Alinda Money  '@[facility]'$    WITH BALLOON DILATION LEFT URETERAL STRICTURE   CYSTOSCOPY WITH RETROGRADE PYELOGRAM, URETEROSCOPY AND STENT  PLACEMENT Left 09-12-2008    dr Jeffie Pollock '@[facility]'$    CYSTOSCOPY WITH RETROGRADE PYELOGRAM, URETEROSCOPY AND STENT PLACEMENT Left 03/23/2020   Procedure: CYSTOSCOPY WITH LEFT RETROGRADE PYELOGRAM, AND LEFT STENT REMOVAL;  Surgeon: Raynelle Bring, MD;  Location: [facility];  Service: Urology;  Laterality: Left;   CYSTOSCOPY WITH STENT PLACEMENT Left 07/26/2018   Procedure: CYSTOSCOPY WITH STENT CHANGE;  Surgeon: Raynelle Bring, MD;  Location: [facility];  Service: Urology;  Laterality: Left;   CYSTOSCOPY WITH STENT PLACEMENT Left 12/03/2018   Procedure: CYSTOSCOPY WITH STENT CHANGE;  Surgeon: Raynelle Bring, MD;  Location: [facility];  Service: Urology;  Laterality: Left;   CYSTOSCOPY WITH STENT PLACEMENT Left 04/22/2019   Procedure: CYSTOSCOPY WITH STENT CHANGE;  Surgeon: Raynelle Bring, MD;  [facility];  Service: Urology;  Laterality: Left;   CYSTOSCOPY WITH STENT PLACEMENT Left 10/03/2019   Procedure: CYSTOSCOPY WITH STENT CHANGE;  Surgeon: Raynelle Bring, MD;  Location[facility];  Service: Urology;  Laterality: Left;   EYE SURGERY     FOOT SURGERY Left 06-23-2010   dr Beola Cord   left first and second toes   INSERTION OF MESH N/A 04/23/2013   Procedure: INSERTION OF MESH;  Surgeon: Adin Hector, MD;  Location: [facility];  Service: General;  Laterality: N/A;   JOINT REPLACEMENT     KNEE ARTHROSCOPY Right 04-27-2007  dr Shellia Carwin '@[facility]'$    LAPAROSCOPIC LYSIS OF ADHESIONS N/A 04/23/2013   Procedure: LAPAROSCOPIC LYSIS OF ADHESIONS;  Surgeon: Adin Hector, MD;  Location: [facility];  Service: General;  Laterality: N/A;   [place] and [place]  12/17/2012   L3 -- L5 laminectomy and L3 -- 5 fusion   TOTAL HIP ARTHROPLASTY Left 07/04/2014   Procedure: LEFT TOTAL HIP ARTHROPLASTY ANTERIOR APPROACH;  Surgeon: Gearlean Alf, MD;  Location: [place];  Service: Orthopedics;  Laterality: Left;   TOTAL HIP ARTHROPLASTY Right 2009   TOTAL KNEE ARTHROPLASTY Right  09-15-2009   dr Shellia Carwin '@[facility]'$    VENTRAL HERNIA REPAIR N/A 07/19/2012   Procedure: LAPAROSCOPIC LYSIS OF ADHESIONS, SMALL BOWEL RESECTION, SEROSAL REPAIR, PRIMARY VENTRAL HERNIA REPAIR;  Surgeon: Adin Hector, MD;  Location[facility];  Service: General;  Laterality: N/A;   VENTRAL HERNIA REPAIR N/A 04/23/2013   Procedure: LAPAROSCOPIC exploration and repair of hernia in abdominal VENTRAL wall  HERNIA;  Surgeon: Adin Hector, MD;  Location[facility];  Service: General;  Laterality: N/A;    Family History  Problem Relation Age of Onset   Diabetes Mother    Lung cancer Mother    Diabetes Father    Liver cancer Father    Colon cancer Sister        close to 45 per pt   Diabetes Sister    Diabetes Sister    Hypertension Sister    Kidney disease Sister    Seizures Daughter    Diabetes Son    Breast cancer Neg Hx    Allergic rhinitis Neg Hx    Angioedema Neg Hx    Asthma Neg Hx    Atopy Neg Hx    Eczema Neg Hx    Immunodeficiency Neg Hx    Urticaria Neg Hx    Colon polyps Neg Hx    Esophageal cancer Neg Hx    Rectal cancer Neg Hx    Stomach cancer Neg Hx         Review of Systems  Constitutional:  Negative for diaphoresis.  Eyes:  Negative for pain.  Respiratory:  Negative for shortness of breath.   Cardiovascular:  Negative for chest pain, palpitations and leg swelling.  Gastrointestinal:  Negative for abdominal pain.  Endocrine: Negative for polydipsia.  Skin:  Negative for rash.  Neurological:  Negative for dizziness, weakness and headaches.  Hematological:  Does not bruise/bleed easily.  All other systems reviewed and are negative.      Objective:   Physical Exam Vitals and nursing note reviewed.  Constitutional:      General: She is not in acute distress.    Appearance: Normal appearance.  She is well-developed.  HENT:     Head: Normocephalic.     Right Ear: Tympanic membrane normal.     Left Ear: Tympanic membrane normal.     Nose: Nose normal.      Mouth/Throat:     Mouth: Mucous membranes are moist.  Eyes:     Pupils: Pupils are equal, round, and reactive to light.  Neck:     Vascular: No carotid bruit or JVD.  Cardiovascular:     Rate and Rhythm: Normal rate and regular rhythm.     Heart sounds: Normal heart sounds.  Pulmonary:     Effort: Pulmonary effort is normal. No respiratory distress.     Breath sounds: Normal breath sounds. No wheezing or rales.  Chest:     Chest wall: No tenderness.  Abdominal:     General: Bowel sounds are normal. There is no distension or abdominal bruit.     Palpations: Abdomen is soft. There is no hepatomegaly, splenomegaly, mass or pulsatile mass.     Tenderness: There is no abdominal tenderness.  Musculoskeletal:        General: Normal range of motion.     Cervical back: Normal range of motion and neck supple.  Lymphadenopathy:     Cervical: No cervical adenopathy.  Skin:    General: Skin is warm and dry.  Neurological:     Mental Status: She is alert and oriented to person, place, and time.     Deep Tendon Reflexes: Reflexes are normal and symmetric.  Psychiatric:        Behavior: Behavior normal.        Thought Content: Thought content normal.        Judgment: Judgment normal.     BP 132/72   Pulse 79   Temp (!) 97.5 F (36.4 C) (Temporal)   Resp 20   Ht 5' (1.524 m)   Wt 170 lb (77.1 kg)   SpO2 99%   BMI 33.20 kg/m        Assessment & Plan:   [name] comes in today with chief complaint of Medical Management of Chronic Issues   Diagnosis and orders addressed:  1. Primary hypertension Low sodium diet - lisinopril (ZESTRIL) 20 MG tablet; Take 1 tablet (20 mg total) by mouth daily.  Dispense: 90 tablet; Refill: 1 - CBC with Differential/Platelet - CMP14+EGFR  2. Peripheral polyneuropathy Check feet daily Do not go barefooted  3. Peripheral edema Elevate  legs when sitting - furosemide (LASIX) 20 MG tablet; Take 1 tablet (20 mg total) by mouth daily.   Dispense: 90 tablet; Refill: 1  4. Hyperlipidemia LDL goal <70 Low fat diet - Lipid panel  5. Recurrent major depressive disorder, in partial remission ([place]) Stress maanegemnt  6. Obesity (BMI 30-39.9) Discussed diet and exercise for person with BMI >25 Will recheck weight in 3-6 months   7. Chronic midline thoracic back pain Moist heat - ToxASSURE Select 13 (MW), Urine - oxyCODONE-acetaminophen (PERCOCET) 7.5-325 MG tablet; Take 1 tablet by mouth 3 (three) times daily as needed for severe pain.  Dispense: 90 tablet; Refill: 0 - amitriptyline (ELAVIL) 25 MG tablet; Take 1 tablet (25 mg total) by mouth at bedtime.  Dispense: 90 tablet; Refill: 1 - oxyCODONE-acetaminophen (PERCOCET) 7.5-325 MG tablet; Take 1 tablet by mouth 3 (three) times daily as needed for severe pain.  Dispense: 90 tablet; Refill: 0 - oxyCODONE-acetaminophen (PERCOCET) 7.5-325 MG tablet; Take 1 tablet by mouth 3 (three) times daily as needed  for severe pain.  Dispense: 90 tablet; Refill: 0  8. BMI 33.0-33.9,adult Discussed diet and exercise for person with BMI >25 Will recheck weight in 3-6 months    Labs pending Health Maintenance reviewed Diet and exercise encouraged  Follow up plan: 3 months   Whitney Hassell Done, FNP

## 2022-06-15 LAB — TOXASSURE SELECT 13 (MW), URINE

## 2022-06-22 ENCOUNTER — Ambulatory Visit (INDEPENDENT_AMBULATORY_CARE_PROVIDER_SITE_OTHER): Payer: Medicare HMO

## 2022-06-22 VITALS — Ht 60.0 in | Wt 170.0 lb

## 2022-06-22 DIAGNOSIS — Z0000 Encounter for general adult medical examination without abnormal findings: Secondary | ICD-10-CM

## 2022-06-22 DIAGNOSIS — Z780 Asymptomatic menopausal state: Secondary | ICD-10-CM

## 2022-06-22 NOTE — Patient Instructions (Signed)
[name] , Thank you for taking time to come for your Medicare Wellness Visit. I appreciate your ongoing commitment to your health goals. Please review the following plan we discussed and let me know if I can assist you in the future.   These are the goals we discussed:  Goals      Exercise 150 min/wk Moderate Activity     Patient Stated     06/18/2020 AWV Goal: Exercise for General Health  Patient will verbalize understanding of the benefits of increased physical activity: Exercising regularly is important. It will improve your overall fitness, flexibility, and endurance. Regular exercise also will improve your overall health. It can help you control your weight, reduce stress, and improve your bone density. Over the next year, patient will increase physical activity as tolerated with a goal of at least 150 minutes of moderate physical activity per week.  You can tell that you are exercising at a moderate intensity if your heart starts beating faster and you start breathing faster but can still hold a conversation. Moderate-intensity exercise ideas include: Walking 1 mile (1.6 km) in about 15 minutes Biking Hiking Golfing Dancing Water aerobics Patient will verbalize understanding of everyday activities that increase physical activity by providing examples like the following: Yard work, such as: Sales promotion account executive Gardening Washing windows or floors Patient will be able to explain general safety guidelines for exercising:  Before you start a new exercise program, talk with your health care provider. Do not exercise so much that you hurt yourself, feel dizzy, or get very short of breath. Wear comfortable clothes and wear shoes with good support. Drink plenty of water while you exercise to prevent dehydration or heat stroke. Work out until your breathing and your heartbeat get faster.          This is a list of the screening recommended for you and due dates:  Health Maintenance  Topic Date Due   Zoster (Shingles) Vaccine (2 of 2) 01/25/2022   DTaP/Tdap/Td vaccine (2 - Td or Tdap) 03/09/2022   COVID-19 Vaccine (7 - 2023-24 season) 07/28/2022   Medicare Annual Wellness Visit  06/23/2023   Colon Cancer Screening  02/26/2024   Pneumonia Vaccine  Completed   Flu Shot  Completed   DEXA scan (bone density measurement)  Completed   Hepatitis C Screening: USPSTF Recommendation to screen - Ages 6-79 yo.  Completed   HPV Vaccine  Aged Out    Advanced directives: Advance directive discussed with you today. I have provided a copy for you to complete at home and have notarized. Once this is complete please bring a copy in to our office so we can scan it into your chart.   Conditions/risks identified: Aim for 30 minutes of exercise or brisk walking, 6-8 glasses of water, and 5 servings of fruits and vegetables each day.   Next appointment: Follow up in one year for your annual wellness visit   Preventive Care 65 Years and Older, Female Preventive care refers to lifestyle choices and visits with your health care provider that can promote health and wellness. What does preventive care include? A yearly physical exam. This is also called an annual well check. Dental exams once or twice a year. Routine eye exams. Ask your health care provider how often you should have your eyes checked. Personal lifestyle choices, including: Daily care of your teeth and gums. Regular physical activity. Eating a  healthy diet. Avoiding tobacco and drug use. Limiting alcohol use. Practicing safe sex. Taking low-dose aspirin every day. Taking vitamin and mineral supplements as recommended by your health care provider. What happens during an annual well check? The services and screenings done by your health care provider during your annual well check will depend on your age, overall health, lifestyle risk  factors, and family history of disease. Counseling  Your health care provider may ask you questions about your: Alcohol use. Tobacco use. Drug use. Emotional well-being. Home and relationship well-being. Sexual activity. Eating habits. History of falls. Memory and ability to understand (cognition). Work and work Statistician. Reproductive health. Screening  You may have the following tests or measurements: Height, weight, and BMI. Blood pressure. Lipid and cholesterol levels. These may be checked every 5 years, or more frequently if you are over 60 years old. Skin check. Lung cancer screening. You may have this screening every year starting at age 71 if you have a 30-pack-year history of smoking and currently smoke or have quit within the past 15 years. Fecal occult blood test (FOBT) of the stool. You may have this test every year starting at age 3. Flexible sigmoidoscopy or colonoscopy. You may have a sigmoidoscopy every 5 years or a colonoscopy every 10 years starting at age 59. Hepatitis C blood test. Hepatitis B blood test. Sexually transmitted disease (STD) testing. Diabetes screening. This is done by checking your blood sugar (glucose) after you have not eaten for a while (fasting). You may have this done every 1-3 years. Bone density scan. This is done to screen for osteoporosis. You may have this done starting at age 63. Mammogram. This may be done every 1-2 years. Talk to your health care provider about how often you should have regular mammograms. Talk with your health care provider about your test results, treatment options, and if necessary, the need for more tests. Vaccines  Your health care provider may recommend certain vaccines, such as: Influenza vaccine. This is recommended every year. Tetanus, diphtheria, and acellular pertussis (Tdap, Td) vaccine. You may need a Td booster every 10 years. Zoster vaccine. You may need this after age 68. Pneumococcal 13-valent  conjugate (PCV13) vaccine. One dose is recommended after age 60. Pneumococcal polysaccharide (PPSV23) vaccine. One dose is recommended after age 39. Talk to your health care provider about which screenings and vaccines you need and how often you need them. This information is not intended to replace advice given to you by your health care provider. Make sure you discuss any questions you have with your health care provider. Document Released: 05/22/2015 Document Revised: 01/13/2016 Document Reviewed: 02/24/2015 Elsevier Interactive Patient Education  2017 [place] Prevention in the Home Falls can cause injuries. They can happen to people of all ages. There are many things you can do to make your home safe and to help prevent falls. What can I do on the outside of my home? Regularly fix the edges of walkways and driveways and fix any cracks. Remove anything that might make you trip as you walk through a door, such as a raised step or threshold. Trim any bushes or trees on the path to your home. Use bright outdoor lighting. Clear any walking paths of anything that might make someone trip, such as rocks or tools. Regularly check to see if handrails are loose or broken. Make sure that both sides of any steps have handrails. Any raised decks and porches should have guardrails on the edges. Have  any leaves, snow, or ice cleared regularly. Use sand or salt on walking paths during winter. Clean up any spills in your garage right away. This includes oil or grease spills. What can I do in the bathroom? Use night lights. Install grab bars by the toilet and in the tub and shower. Do not use towel bars as grab bars. Use non-skid mats or decals in the tub or shower. If you need to sit down in the shower, use a plastic, non-slip stool. Keep the floor dry. Clean up any water that spills on the floor as soon as it happens. Remove soap buildup in the tub or shower regularly. Attach bath mats  securely with double-sided non-slip rug tape. Do not have throw rugs and other things on the floor that can make you trip. What can I do in the bedroom? Use night lights. Make sure that you have a light by your bed that is easy to reach. Do not use any sheets or blankets that are too big for your bed. They should not hang down onto the floor. Have a firm chair that has side arms. You can use this for support while you get dressed. Do not have throw rugs and other things on the floor that can make you trip. What can I do in the kitchen? Clean up any spills right away. Avoid walking on wet floors. Keep items that you use a lot in easy-to-reach places. If you need to reach something above you, use a strong step stool that has a grab bar. Keep electrical cords out of the way. Do not use floor polish or wax that makes floors slippery. If you must use wax, use non-skid floor wax. Do not have throw rugs and other things on the floor that can make you trip. What can I do with my stairs? Do not leave any items on the stairs. Make sure that there are handrails on both sides of the stairs and use them. Fix handrails that are broken or loose. Make sure that handrails are as long as the stairways. Check any carpeting to make sure that it is firmly attached to the stairs. Fix any carpet that is loose or worn. Avoid having throw rugs at the top or bottom of the stairs. If you do have throw rugs, attach them to the floor with carpet tape. Make sure that you have a light switch at the top of the stairs and the bottom of the stairs. If you do not have them, ask someone to add them for you. What else can I do to help prevent falls? Wear shoes that: Do not have high heels. Have rubber bottoms. Are comfortable and fit you well. Are closed at the toe. Do not wear sandals. If you use a stepladder: Make sure that it is fully opened. Do not climb a closed stepladder. Make sure that both sides of the stepladder  are locked into place. Ask someone to hold it for you, if possible. Clearly mark and make sure that you can see: Any grab bars or handrails. First and last steps. Where the edge of each step is. Use tools that help you move around (mobility aids) if they are needed. These include: Canes. Walkers. Scooters. Crutches. Turn on the lights when you go into a dark area. Replace any light bulbs as soon as they burn out. Set up your furniture so you have a clear path. Avoid moving your furniture around. If any of your floors are uneven, fix them.  If there are any pets around you, be aware of where they are. Review your medicines with your doctor. Some medicines can make you feel dizzy. This can increase your chance of falling. Ask your doctor what other things that you can do to help prevent falls. This information is not intended to replace advice given to you by your health care provider. Make sure you discuss any questions you have with your health care provider. Document Released: 02/19/2009 Document Revised: 10/01/2015 Document Reviewed: 05/30/2014 Elsevier Interactive Patient Education  2017 [employer].

## 2022-06-22 NOTE — Progress Notes (Signed)
Subjective:   [name] is a 77 y.o. female who presents for Medicare Annual (Subsequent) preventive examination. I connected with  [name] on 06/22/22 by a audio enabled telemedicine application and verified that I am speaking with the correct person using two identifiers.  Patient Location: Home  Provider Location: Home Office  I discussed the limitations of evaluation and management by telemedicine. The patient expressed understanding and agreed to proceed.  Review of Systems     Cardiac Risk Factors include: advanced age (>11mn, >>56women);dyslipidemia     Objective:    Today's Vitals   06/22/22 0917  Weight: 170 lb (77.1 kg)  Height: 5' (1.524 m)   Body mass index is 33.2 kg/m.     06/22/2022    9:20 AM 07/22/2021    7:43 PM 07/22/2021    3:18 PM 06/21/2021    9:00 AM 06/18/2020    8:59 AM 03/19/2020    9:11 AM 09/25/2019   11:22 AM  Advanced Directives  Does Patient Have a Medical Advance Directive? Yes No No Yes No Yes Yes  Type of AParamed[place]of [place] will   [place] will  Living will [place]  Copy of [place]? No - copy requested   No - copy requested     Would patient like information on creating a medical advance directive?  No - Patient declined   No - Patient declined      Current Medications (verified) Outpatient Encounter Medications as of 06/22/2022  Medication Sig   amitriptyline (ELAVIL) 25 MG tablet Take 1 tablet (25 mg total) by mouth at bedtime.   atorvastatin (LIPITOR) 40 MG tablet Take 1 tablet (40 mg total) by mouth daily.   furosemide (LASIX) 20 MG tablet Take 1 tablet (20 mg total) by mouth daily.   lisinopril (ZESTRIL) 20 MG tablet Take 1 tablet (20 mg total) by mouth daily.   meloxicam (MOBIC) 7.5 MG tablet Take 1 tablet by mouth once daily   methocarbamol (ROBAXIN) 500 MG tablet TAKE 1 TABLET BY MOUTH AT  BEDTIME   metoprolol tartrate (LOPRESSOR) 25 MG tablet Take 1 tablet (25 mg total) by mouth as directed. Take one tablet 2 hours before your CT scan   mupirocin ointment (BACTROBAN) 2 % Apply 1 Application topically as needed (for iritation).   nystatin cream (MYCOSTATIN) Apply 1 Application topically as needed for dry skin.   oxyCODONE-acetaminophen (PERCOCET) 7.5-325 MG tablet Take 1 tablet by mouth 3 (three) times daily as needed for severe pain.   [START ON 08/12/2022] oxyCODONE-acetaminophen (PERCOCET) 7.5-325 MG tablet Take 1 tablet by mouth 3 (three) times daily as needed for severe pain.   [START ON 07/13/2022] oxyCODONE-acetaminophen (PERCOCET) 7.5-325 MG tablet Take 1 tablet by mouth 3 (three) times daily as needed for severe pain.   pantoprazole (PROTONIX) 40 MG tablet TAKE 1 TABLET BY MOUTH ONCE DAILY BEFORE SUPPER FOR STOMACH PAIN   No facility-administered encounter medications on file as of 06/22/2022.    Allergies (verified) Cashew nut oil, Dog epithelium, and Sulfa antibiotics   History: Past Medical History:  Diagnosis Date   Arthritis    [place]    ovarian,skin Ca. in mouth   Cataract    Bil cataracts removed   Chronic constipation    Chronic kidney disease    Chronic midline low back pain with sciatica    Colon polyp    large ascending  colon polyp , scheduled for resection 02/ 2021   Full dentures    History of endometrial cancer 2012   s/p   TAH w/ BSO 11-02-2010   History of recurrent UTIs    History of sepsis    multiple times (some due to e.coli) last sepsis 07/ 2019   History of small bowel obstruction    x2  ,  hx bowel resection   HOH (hard of hearing)    Hyperlipidemia    Hypertension    Retroperitoneal fibrosis    Substance abuse ([place])    opiod dependancy   Ureteral obstruction, left urologist-- dr Alinda Money   secondary to adhesions/ retroperitoneal fibrosis, treated with ureteral stent   Vertigo    Wears glasses    Past Surgical History:   Procedure Laterality Date   ABDOMINAL HYSTERECTOMY  11/02/10  dr Alycia Rossetti  @[facility]    TAH, BSO, incisional hernia repair , lysis of adhesions for endometrial cancer   ANTERIOR LUMBAR FUSION  01-28-2008   dr elsner  @[facility]    L2 -- L4   BACK SURGERY     BUNIONECTOMY Right 2003   CATARACT EXTRACTION W/ INTRAOCULAR LENS  IMPLANT, BILATERAL  2007   COLONOSCOPY  last one 12-24-2018   CYSTO/  LEFT RETROGRADE PYELOGRAM/ URETEROSCOPY STENT PLACEMENT/  OPEN URETEROLYSIS WITH OMENTAL FLAP Left 03-11-2009    dr borden  @[facility]    CYSTO/  [facility] SLING/  ANTERIOR REPAIR  12-06-2001    dr Jeffie Pollock @[facility]    CYSTOSCOPY W/ URETERAL STENT PLACEMENT Left 12/07/2017   Procedure: CYSTOSCOPY WITH RETROGRADE PYELOGRAM/URETERAL STENT PLACEMENT;  Surgeon: Raynelle Bring, MD;  Location: [facility];  Service: Urology;  Laterality: Left;   CYSTOSCOPY W/ URETERAL STENT PLACEMENT Left 03/29/2018   Procedure: CYSTOSCOPY WITH STENT  EXCHANGE;  Surgeon: Raynelle Bring, MD;  [facility];  Service: Urology;  Laterality: Left;   CYSTOSCOPY WITH RETROGRADE PYELOGRAM, URETEROSCOPY AND STENT PLACEMENT Left 12-29-2008   dr Alinda Money  @[facility]    WITH BALLOON DILATION LEFT URETERAL STRICTURE   CYSTOSCOPY WITH RETROGRADE PYELOGRAM, URETEROSCOPY AND STENT PLACEMENT Left 09-12-2008    dr Jeffie Pollock @[facility]    CYSTOSCOPY WITH RETROGRADE PYELOGRAM, URETEROSCOPY AND STENT PLACEMENT Left 03/23/2020   Procedure: CYSTOSCOPY WITH LEFT RETROGRADE PYELOGRAM, AND LEFT STENT REMOVAL;  Surgeon: Raynelle Bring, MD;  [facility];  Service: Urology;  Laterality: Left;   CYSTOSCOPY WITH STENT PLACEMENT Left 07/26/2018   Procedure: CYSTOSCOPY WITH STENT CHANGE;  Surgeon: Raynelle Bring, MD;  [facility];  Service: Urology;  Laterality: Left;   CYSTOSCOPY WITH STENT PLACEMENT Left 12/03/2018   Procedure: CYSTOSCOPY WITH STENT CHANGE;  Surgeon: Raynelle Bring, MD;  [facility];  Service: Urology;  Laterality: Left;   CYSTOSCOPY WITH STENT PLACEMENT Left 04/22/2019    Procedure: CYSTOSCOPY WITH STENT CHANGE;  Surgeon: Raynelle Bring, MD;  Location[facility];  Service: Urology;  Laterality: Left;   CYSTOSCOPY WITH STENT PLACEMENT Left 10/03/2019   Procedure: CYSTOSCOPY WITH STENT CHANGE;  Surgeon: Raynelle Bring, MD;  [facility];  Service: Urology;  Laterality: Left;   EYE SURGERY     FOOT SURGERY Left 06-23-2010   dr Beola Cord   left first and second toes   INSERTION OF MESH N/A 04/23/2013   Procedure: INSERTION OF MESH;  Surgeon: Adin Hector, MD;  [facility];  Service: General;  Laterality: N/A;   JOINT REPLACEMENT     KNEE ARTHROSCOPY Right 04-27-2007  dr Shellia Carwin @[facility]    LAPAROSCOPIC LYSIS OF ADHESIONS N/A  04/23/2013   Procedure: LAPAROSCOPIC LYSIS OF ADHESIONS;  Surgeon: Adin Hector, MD;  [facility];  Service: General;  Laterality: N/A;   [place]  12/17/2012   L3 -- L5 laminectomy and L3 -- 5 fusion   TOTAL HIP ARTHROPLASTY Left 07/04/2014   Procedure: LEFT TOTAL HIP ARTHROPLASTY ANTERIOR APPROACH;  Surgeon: Gearlean Alf, MD;  Location: [facility];  Service: Orthopedics;  Laterality: Left;   TOTAL HIP ARTHROPLASTY Right 2009   TOTAL KNEE ARTHROPLASTY Right 09-15-2009   dr Shellia Carwin @[facility]    VENTRAL HERNIA REPAIR N/A 07/19/2012   Procedure: LAPAROSCOPIC LYSIS OF ADHESIONS, SMALL BOWEL RESECTION, SEROSAL REPAIR, PRIMARY VENTRAL HERNIA REPAIR;  Surgeon: Adin Hector, MD;  [facility];  Service: General;  Laterality: N/A;   VENTRAL HERNIA REPAIR N/A 04/23/2013   Procedure: LAPAROSCOPIC exploration and repair of hernia in abdominal VENTRAL wall  HERNIA;  Surgeon: Adin Hector, MD;  [facility];  Service: General;  Laterality: N/A;   Family History  Problem Relation Age of Onset   Diabetes Mother    Lung cancer Mother    Diabetes Father    Liver cancer Father    Colon cancer Sister        close to 15 per pt   Diabetes Sister    Diabetes Sister     Hypertension Sister    Kidney disease Sister    Seizures Daughter    Diabetes Son    Breast cancer Neg Hx    Allergic rhinitis Neg Hx    Angioedema Neg Hx    Asthma Neg Hx    Atopy Neg Hx    Eczema Neg Hx    Immunodeficiency Neg Hx    Urticaria Neg Hx    Colon polyps Neg Hx    Esophageal cancer Neg Hx    Rectal cancer Neg Hx    Stomach cancer Neg Hx    Social History   Socioeconomic History   Marital status: Single    Spouse name: Not on file   Number of children: 3   Years of education: 12   Highest education level: High school graduate  Occupational History   Occupation: Retired  Tobacco Use   Smoking status: Never   Smokeless tobacco: Never  Vaping Use   Vaping Use: Never used  Substance and Sexual Activity   Alcohol use: No   Drug use: No   Sexual activity: Not Currently    Birth control/protection: Surgical  Other Topics Concern   Not on file  Social History Narrative   Her son and DIL live with her   They help with bills   Social Determinants of Health   Financial Resource Strain: Low Risk  (06/22/2022)   Overall Financial Resource Strain (CARDIA)    Difficulty of Paying Living Expenses: Not hard at all  Food Insecurity: No Food Insecurity (06/22/2022)   Hunger Vital Sign    Worried About Running Out of Food in the Last Year: Never true    Ran Out of Food in the Last Year: Never true  Transportation Needs: No Transportation Needs (06/22/2022)   PRAPARE - Hydrologist (Medical): No    Lack of Transportation (Non-Medical): No  Physical Activity: Insufficiently Active (06/22/2022)   Exercise Vital Sign    Days of Exercise per Week: 3 days    Minutes of Exercise per Session: 30 min  Stress: No Stress  Concern Present (06/22/2022)   [place]    Feeling of Stress : Not at all  Social Connections: Moderately Isolated (06/22/2022)   Social Connection and Isolation  Panel [NHANES]    Frequency of Communication with Friends and Family: More than three times a week    Frequency of Social Gatherings with Friends and Family: More than three times a week    Attends Religious Services: More than 4 times per year    Active Member of [employer] or Organizations: No    Attends Archivist Meetings: Never    Marital Status: Widowed    Tobacco Counseling Counseling given: Not Answered   Clinical Intake:  Pre-visit preparation completed: Yes  Pain : No/denies pain     Nutritional Risks: None Diabetes: No  How often do you need to have someone help you when you read instructions, pamphlets, or other written materials from your doctor or pharmacy?: 1 - Never  Diabetic?no   Interpreter Needed?: No  Information entered by :: Jadene Pierini, LPN   Activities of Daily Living    06/22/2022    9:21 AM 07/22/2021    7:45 PM  In your present state of health, do you have any difficulty performing the following activities:  Hearing? 0   Vision? 0   Difficulty concentrating or making decisions? 0   Walking or climbing stairs? 0   Dressing or bathing? 0   Doing errands, shopping? 0 0  Preparing Food and eating ? N   Using the Toilet? N   In the past six months, have you accidently leaked urine? N   Do you have problems with loss of bowel control? N   Managing your Medications? N   Managing your Finances? N   Housekeeping or managing your Housekeeping? N     Patient Care Team: Chevis Pretty, FNP as PCP - General (Family Medicine) Nancy Marus, MD as Consulting Physician (Gynecologic Oncology) Raynelle Bring, MD as Consulting Physician (Urology) Marlaine Hind, MD as Consulting Physician (Pain Medicine) Loletha Carrow Kirke Corin, MD as Consulting Physician (Gastroenterology) Michael Boston, MD as Consulting Physician (General Surgery)  Indicate any recent Medical Services you may have received from other than [facility] providers in the past year  (date may be approximate).     Assessment:   This is a routine wellness examination for Nick.  Hearing/Vision screen Vision Screening - Comments:: Wears rx glasses - up to date with routine [facility] exams with  [facility]   Dietary issues and exercise activities discussed: Current Exercise Habits: Home exercise routine, Type of exercise: walking, Time (Minutes): 30, Frequency (Times/Week): 3, Weekly Exercise (Minutes/Week): 90, Intensity: Mild, Exercise limited by: orthopedic condition(s)   Goals Addressed             This Visit's Progress    Patient Stated   On track    06/18/2020 AWV Goal: Exercise for General Health  Patient will verbalize understanding of the benefits of increased physical activity: Exercising regularly is important. It will improve your overall fitness, flexibility, and endurance. Regular exercise also will improve your overall health. It can help you control your weight, reduce stress, and improve your bone density. Over the next year, patient will increase physical activity as tolerated with a goal of at least 150 minutes of moderate physical activity per week.  You can tell that you are exercising at a moderate intensity if your heart starts beating faster and you start breathing faster  but can still hold a conversation. Moderate-intensity exercise ideas include: Walking 1 mile (1.6 km) in about 15 minutes Biking Hiking Golfing Dancing Water aerobics Patient will verbalize understanding of everyday activities that increase physical activity by providing examples like the following: Yard work, such as: Sales promotion account executive Gardening Washing windows or floors Patient will be able to explain general safety guidelines for exercising:  Before you start a new exercise program, talk with your health care provider. Do not exercise so much that you hurt yourself, feel dizzy,  or get very short of breath. Wear comfortable clothes and wear shoes with good support. Drink plenty of water while you exercise to prevent dehydration or heat stroke. Work out until your breathing and your heartbeat get faster.        Depression Screen    06/22/2022    9:19 AM 06/13/2022   10:06 AM 03/01/2022   11:02 AM 01/04/2022    9:31 AM 11/30/2021    9:59 AM 09/03/2021   11:36 AM 08/31/2021    9:54 AM  PHQ 2/9 Scores  PHQ - 2 Score 0 0 0 0 0 0 0  PHQ- 9 Score 0 0 1 2 1 1 $ 0    Fall Risk    06/22/2022    9:18 AM 06/13/2022   10:06 AM 03/01/2022   11:02 AM 01/04/2022    9:31 AM 11/30/2021    9:58 AM  Fall Risk   Falls in the past year? 0 0 0 0 0  Number falls in past yr: 0      Injury with Fall? 0      Risk for fall due to : No Fall Risks      Follow up Falls prevention discussed        [place]:  Any stairs in or around the home? Yes  If so, are there any without handrails? No  Home free of loose throw rugs in walkways, pet beds, electrical cords, etc? Yes  Adequate lighting in your home to reduce risk of falls? Yes   ASSISTIVE DEVICES UTILIZED TO PREVENT FALLS:  Life alert? No  Use of a cane, walker or w/c? Yes  Grab bars in the bathroom? Yes  Shower chair or bench in shower? Yes  Elevated toilet seat or a handicapped toilet? Yes       01/11/2018   11:21 AM  MMSE - Mini Mental State Exam  Orientation to time 5  Orientation to Place 5  Registration 3  Attention/ Calculation 5  Recall 3  Language- name 2 objects 2  Language- repeat 1  Language- follow 3 step command 3  Language- read & follow direction 1  Write a sentence 1  Copy design 1  Total score 30        06/22/2022    9:21 AM 06/21/2021    9:02 AM 06/18/2020    9:00 AM  6CIT Screen  What Year? 0 points 0 points 0 points  What month? 0 points 0 points 0 points  What time? 0 points 0 points 0 points  Count back from 20 0 points 0 points 0 points  Months in  reverse 0 points 2 points 0 points  Repeat phrase 0 points 2 points 0 points  Total Score 0 points 4 points 0 points    Immunizations Immunization History  Administered Date(s) Administered   Fluad Quad(high Dose  65+) 03/22/2019, 03/20/2020, 03/09/2021, 03/02/2022   Influenza, High Dose Seasonal PF 02/19/2016, 03/20/2017, 03/14/2018   Influenza,inj,Quad PF,6+ Mos 02/21/2013, 03/28/2014, 04/17/2015   Moderna Covid-19 Vaccine Bivalent Booster 39yr & up 06/01/2021   Moderna SARS-COV2 Booster Vaccination 06/02/2022   Moderna Sars-Covid-2 Vaccination 07/15/2019, 08/12/2019, 03/31/2020, 11/17/2020   Pneumococcal Conjugate-13 09/30/2014   Pneumococcal Polysaccharide-23 05/09/2010   Pneumococcal-Unspecified 05/09/2016   Tdap 03/09/2012   Zoster Recombinat (Shingrix) 11/30/2021   Zoster, Live 05/09/2010    TDAP status: Due, Education has been provided regarding the importance of this vaccine. Advised may receive this vaccine at local pharmacy or Health Dept. Aware to provide a copy of the vaccination record if obtained from local pharmacy or Health Dept. Verbalized acceptance and understanding.  Flu Vaccine status: Up to date  Pneumococcal vaccine status: Up to date  Covid-19 vaccine status: Completed vaccines  Qualifies for Shingles Vaccine? Yes   Zostavax completed No   Shingrix Completed?: No.    Education has been provided regarding the importance of this vaccine. Patient has been advised to call insurance company to determine out of pocket expense if they have not yet received this vaccine. Advised may also receive vaccine at local pharmacy or Health Dept. Verbalized acceptance and understanding.  Screening Tests Health Maintenance  Topic Date Due   Zoster Vaccines- Shingrix (2 of 2) 01/25/2022   DTaP/Tdap/Td (2 - Td or Tdap) 03/09/2022   COVID-19 Vaccine (7 - 2023-24 season) 07/28/2022   Medicare Annual Wellness (AWV)  06/23/2023   COLONOSCOPY (Pts 45-468yrInsurance coverage  will need to be confirmed)  02/26/2024   Pneumonia Vaccine 6555Years old  Completed   INFLUENZA VACCINE  Completed   DEXA SCAN  Completed   Hepatitis C Screening  Completed   HPV VACCINES  Aged Out    Health Maintenance  Health Maintenance Due  Topic Date Due   Zoster Vaccines- Shingrix (2 of 2) 01/25/2022   DTaP/Tdap/Td (2 - Td or Tdap) 03/09/2022    Colorectal cancer screening: No longer required.   Mammogram status: No longer required due to age .  Bone Density status: Ordered 06/22/2022. Pt provided with contact info and advised to call to schedule appt.  Lung Cancer Screening: (Low Dose CT Chest recommended if Age 78-80ears, 30 pack-year currently smoking OR have quit w/in 15years.) does not qualify.   Lung Cancer Screening Referral: n/a  Additional Screening:  Hepatitis C Screening: does not qualify;   Vision Screening: Recommended annual ophthalmology exams for early detection of glaucoma and other disorders of the eye. Is the patient up to date with their annual eye exam?  Yes  Who is the provider or what is the name of the office in which the patient attends annual eye exams? [facility] is not established with a provider, would they like to be referred to a provider to establish care? No .   Dental Screening: Recommended annual dental exams for proper oral hygiene  Community Resource Referral / Chronic Care Management: CRR required this visit?  No   CCM required this visit?  No      Plan:     I have personally reviewed and noted the following in the patient's chart:   Medical and social history Use of alcohol, tobacco or illicit drugs  Current medications and supplements including opioid prescriptions. Patient is not currently taking opioid prescriptions. Functional ability and status Nutritional status Physical activity Advanced directives List of other physicians Hospitalizations, surgeries, and ER visits  in previous 12  months Vitals Screenings to include cognitive, depression, and falls Referrals and appointments  In addition, I have reviewed and discussed with patient certain preventive protocols, quality metrics, and best practice recommendations. A written personalized care plan for preventive services as well as general preventive health recommendations were provided to patient.     Daphane Shepherd, LPN   [id number]   Nurse Notes: Due TDAP Vaccine

## 2022-07-11 NOTE — Telephone Encounter (Signed)
Call to discuss recent lipid lab.  LDLc 123 in July 2023, patient started taking atorvastatin 40 mg daily in Oct 2023. Tolerating it well without any s/e. Follow up LDLc 51 (feb 2024) encouraged patient to continue with therapy.

## 2022-07-12 ENCOUNTER — Ambulatory Visit
Admission: RE | Admit: 2022-07-12 | Discharge: 2022-07-12 | Disposition: A | Discharge status: Home / Self Care | Payer: Medicare HMO | Source: Ambulatory Visit | Attending: Nurse Practitioner | Admitting: Nurse Practitioner

## 2022-07-12 DIAGNOSIS — Z1231 Encounter for screening mammogram for malignant neoplasm of breast: Secondary | ICD-10-CM | POA: Diagnosis not present

## 2022-07-26 ENCOUNTER — Ambulatory Visit (INDEPENDENT_AMBULATORY_CARE_PROVIDER_SITE_OTHER): Payer: Medicare HMO

## 2022-07-26 ENCOUNTER — Ambulatory Visit (INDEPENDENT_AMBULATORY_CARE_PROVIDER_SITE_OTHER): Payer: Medicare HMO | Admitting: Nurse Practitioner

## 2022-07-26 ENCOUNTER — Encounter: Payer: Self-pay | Admitting: Nurse Practitioner

## 2022-07-26 ENCOUNTER — Other Ambulatory Visit: Payer: Self-pay | Admitting: Nurse Practitioner

## 2022-07-26 VITALS — BP 142/85 | HR 63 | Temp 98.2°F | Resp 20 | Ht 61.2 in | Wt 170.0 lb

## 2022-07-26 DIAGNOSIS — M25572 Pain in left ankle and joints of left foot: Secondary | ICD-10-CM | POA: Diagnosis not present

## 2022-07-26 DIAGNOSIS — G8929 Other chronic pain: Secondary | ICD-10-CM

## 2022-07-26 DIAGNOSIS — S8012XA Contusion of left lower leg, initial encounter: Secondary | ICD-10-CM | POA: Diagnosis not present

## 2022-07-26 NOTE — Patient Instructions (Signed)
Contusion A contusion is a deep bruise. This is a result of an injury that causes bleeding under the skin. Symptoms of bruising include pain, swelling, and discolored skin. The skin may turn blue, purple, or yellow. Follow these instructions at home: Managing pain, stiffness, and swelling You may use RICE. This stands for: Resting. Icing. Compression, or putting pressure on the injured area. Elevating, or raising the injured area. To follow this method, do these actions: Rest the injured area. If told, put ice on the injured area. To do this: Put ice in a plastic bag. Place a towel between your skin and the bag. Leave the ice on for 20 minutes, 2-3 times per day. If your skin turns bright red, take off the ice right away to prevent skin damage. The risk of skin damage is higher if you cannot feel pain, heat, or cold. If told, apply compression on the injured area using an elastic bandage. Make sure the bandage is not too tight. If the area tingles or has a loss of feeling (numbness), remove it and put it back on as told by your doctor. If possible, elevate the injured area above the level of your heart while you are sitting or lying down.  General instructions Take over-the-counter and prescription medicines only as told by your doctor. Keep all follow-up visits. Your doctor may want to see how your contusion is healing with treatment. Contact a doctor if: Your symptoms do not get better after several days of treatment. Your symptoms get worse. You have trouble moving the injured area. Get help right away if: You have very bad pain. You have a loss of feeling (numbness) in a hand or foot. Your hand or foot turns pale or cold. This information is not intended to replace advice given to you by your health care provider. Make sure you discuss any questions you have with your health care provider. Document Revised: 10/11/2021 Document Reviewed: 10/11/2021 Elsevier Patient Education  2023  [employer]  

## 2022-07-26 NOTE — Progress Notes (Signed)
Subjective:    Patient ID: [name], female    DOB: [birth year], 77 y.o.   MRN: [id number]   Chief Complaint: left ankle pain  HPI Patient feel 10 days ago. She fell off of the commode and her right heel hit her  left lower leg and ankle. Very bruised and still hurts to walk on. 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  Eyes:  Negative for pain.  Respiratory:  Negative for shortness of breath.   Cardiovascular:  Negative for chest pain, palpitations and leg swelling.  Gastrointestinal:  Negative for abdominal pain.  Endocrine: Negative for polydipsia.  Skin:  Negative for rash.  Neurological:  Negative for dizziness, weakness and headaches.  Hematological:  Does not bruise/bleed easily.  All other systems reviewed and are negative.      Objective:   Physical Exam Vitals and nursing note reviewed.  Constitutional:      General: She is not in acute distress.    Appearance: Normal appearance. She is well-developed.  Neck:     Vascular: No carotid bruit or JVD.  Cardiovascular:     Rate and Rhythm: Normal rate and regular rhythm.     Heart sounds: Normal heart sounds.  Pulmonary:     Effort: Pulmonary effort is normal. No respiratory  distress.     Breath sounds: Normal breath sounds. No wheezing or rales.  Chest:     Chest wall: No tenderness.  Abdominal:     General: There is no distension or abdominal bruit.     Palpations: There is no hepatomegaly, splenomegaly, mass or pulsatile mass.     Tenderness: There is no abdominal tenderness.  Musculoskeletal:        General: Normal range of motion.     Cervical back: Normal range of motion and neck supple.     Comments: FROM of left ankle without pain  Lymphadenopathy:     Cervical: No cervical adenopathy.  Skin:    General: Skin is warm and dry.     Comments: Large purple contusion on medial side of left lower leg that spreads down to foot- see photo attached.  Neurological:     Mental Status: She is alert and oriented to person, place, and time.     Deep Tendon Reflexes: Reflexes are normal and symmetric.  Psychiatric:        Behavior: Behavior normal.        Thought Content: Thought content normal.        Judgment: Judgment normal.    BP (!) 142/85   Pulse 63   Temp 98.2 F (36.8 C) (Temporal)   Resp 20   Ht 5' 1.2" (1.554 m)   Wt 170 lb (77.1 kg)   SpO2 95%  BMI 31.91 kg/m    Xray- no fracture     Assessment & Plan:   CHRISTIYANA NEST in today with chief complaint of Ankle Pain   1. Acute left ankle pain  - DG Ankle Complete Left  2. Contusion of left lower leg, initial encounter Rest  Ice bid Compression wrap if helps Elevate wen sitting    The above assessment and management plan was discussed with the patient. The patient verbalized understanding of and has agreed to the management plan. Patient is aware to call the clinic if symptoms persist or worsen. Patient is aware when to return to the clinic for a follow-up visit. Patient educated on when it is appropriate to go to the emergency department.   Mary-Margaret Hassell Done, FNP

## 2022-07-28 DIAGNOSIS — L84 Corns and callosities: Secondary | ICD-10-CM | POA: Diagnosis not present

## 2022-07-28 DIAGNOSIS — G609 Hereditary and idiopathic neuropathy, unspecified: Secondary | ICD-10-CM | POA: Diagnosis not present

## 2022-07-28 DIAGNOSIS — M79676 Pain in unspecified toe(s): Secondary | ICD-10-CM | POA: Diagnosis not present

## 2022-07-28 DIAGNOSIS — B351 Tinea unguium: Secondary | ICD-10-CM | POA: Diagnosis not present

## 2022-08-22 ENCOUNTER — Other Ambulatory Visit: Payer: Self-pay

## 2022-08-22 DIAGNOSIS — Z780 Asymptomatic menopausal state: Secondary | ICD-10-CM

## 2022-09-09 ENCOUNTER — Ambulatory Visit (INDEPENDENT_AMBULATORY_CARE_PROVIDER_SITE_OTHER): Payer: Medicare HMO

## 2022-09-09 ENCOUNTER — Ambulatory Visit (INDEPENDENT_AMBULATORY_CARE_PROVIDER_SITE_OTHER): Payer: Medicare HMO | Admitting: Nurse Practitioner

## 2022-09-09 ENCOUNTER — Encounter: Payer: Self-pay | Admitting: Nurse Practitioner

## 2022-09-09 VITALS — BP 139/73 | HR 55 | Temp 97.9°F | Resp 20 | Ht 61.0 in | Wt 169.0 lb

## 2022-09-09 DIAGNOSIS — Z780 Asymptomatic menopausal state: Secondary | ICD-10-CM

## 2022-09-09 DIAGNOSIS — M1610 Unilateral primary osteoarthritis, unspecified hip: Secondary | ICD-10-CM

## 2022-09-09 DIAGNOSIS — G8929 Other chronic pain: Secondary | ICD-10-CM

## 2022-09-09 DIAGNOSIS — M546 Pain in thoracic spine: Secondary | ICD-10-CM

## 2022-09-09 DIAGNOSIS — M85832 Other specified disorders of bone density and structure, left forearm: Secondary | ICD-10-CM | POA: Diagnosis not present

## 2022-09-09 MED ORDER — OXYCODONE-ACETAMINOPHEN 7.5-325 MG PO TABS
1.0000 | ORAL_TABLET | Freq: Three times a day (TID) | ORAL | 0 refills | Status: AC | PRN
Start: 2022-09-09 — End: 2022-10-09

## 2022-09-09 MED ORDER — OXYCODONE-ACETAMINOPHEN 7.5-325 MG PO TABS: 1.0000 | ORAL_TABLET | Freq: Three times a day (TID) | ORAL | 0 refills | Status: AC | PRN

## 2022-09-09 NOTE — Progress Notes (Signed)
Subjective:    Patient ID: [name], female    DOB: 25-Mar-1946, 77 y.o.   MRN: [id number]   Chief Complaint: pain management   HPI:  [name] is a 77 y.o. who identifies as a female who was assigned female at birth.   Social history: Lives with: by herself Work history: retired   Water engineer in today for follow up of the following chronic medical issues:  1. Primary osteoarthritis of hip, unspecified laterality Pain assessment: Cause of pain- osteoarthritis Pain location- hips and back Pain on scale of 1-10- 4/10 currently Frequency- daily What increases pain-nothing really What makes pain Better-pain meds help Effects on ADL - none Any change in general medical condition-no  Current opioids rx- percocet 7.5/325 TID # meds rx- 90 Effectiveness of current meds-helps Adverse reactions from pain meds-none Morphine equivalent- 33.75MME  Pill count performed-No Last drug screen - 06/13/22 ( high risk q60m, moderate risk q32m, low risk yearly ) Urine drug screen today- No Was the [facility] reviewed- yes  If yes were their any concerning findings? - no   Overdose risk: 1    09/15/2020   10:41 AM  Opioid Risk   Alcohol 0  Illegal Drugs 0  Rx Drugs 0  Alcohol 0  Illegal Drugs 0  Rx Drugs 0  Age between 16-45 years  0  History of Preadolescent Sexual Abuse 0  Psychological Disease 0  Depression 0  Opioid Risk Tool Scoring 0  Opioid Risk Interpretation Low Risk     Pain contract signed on:06/15/22    New complaints: none  Allergies  Allergen Reactions   Cashew Nut Oil Hives   Dog Epithelium Hives and Itching   Sulfa Antibiotics Other (See Comments)    "blistering lips and mouth sores"   Outpatient Encounter Medications as of 09/09/2022  Medication Sig   amitriptyline (ELAVIL) 25 MG tablet Take 1 tablet (25 mg total) by mouth at bedtime.   atorvastatin (LIPITOR) 40 MG tablet Take 1 tablet (40 mg total) by mouth daily.   furosemide (LASIX) 20 MG  tablet Take 1 tablet (20 mg total) by mouth daily.   lisinopril (ZESTRIL) 20 MG tablet Take 1 tablet (20 mg total) by mouth daily.   meloxicam (MOBIC) 7.5 MG tablet Take 1 tablet by mouth once daily   methocarbamol (ROBAXIN) 500 MG tablet TAKE 1 TABLET BY MOUTH AT BEDTIME   mupirocin ointment (BACTROBAN) 2 % Apply 1 Application topically as needed (for iritation).   nystatin cream (MYCOSTATIN) Apply 1 Application topically as needed for dry skin.   oxyCODONE-acetaminophen (PERCOCET) 7.5-325 MG tablet Take 1 tablet by mouth 3 (three) times daily as needed for severe pain.   pantoprazole (PROTONIX) 40 MG tablet TAKE 1 TABLET BY MOUTH ONCE DAILY BEFORE SUPPER FOR STOMACH PAIN   No facility-administered encounter medications on file as of 09/09/2022.    Past Surgical History:  Procedure Laterality Date   ABDOMINAL HYSTERECTOMY  11/02/10  dr Duard Brady  @[facility]    TAH, BSO, incisional hernia repair , lysis of adhesions for endometrial cancer   ANTERIOR LUMBAR FUSION  01-28-2008   dr elsner  @[facility]    L2 -- L4   BACK SURGERY     BUNIONECTOMY Right 2003   CATARACT EXTRACTION W/ INTRAOCULAR LENS  IMPLANT, BILATERAL  2007   COLONOSCOPY  last one 12-24-2018   CYSTO/  LEFT RETROGRADE PYELOGRAM/ URETEROSCOPY STENT PLACEMENT/  OPEN URETEROLYSIS WITH OMENTAL FLAP Left 03-11-2009    dr borden  @[facility]   CYSTO/  [facility]/  ANTERIOR REPAIR  12-06-2001    dr Annabell Howells @[facility]    CYSTOSCOPY W/ URETERAL STENT PLACEMENT Left 12/07/2017   Procedure: CYSTOSCOPY WITH RETROGRADE PYELOGRAM/URETERAL STENT PLACEMENT;  Surgeon: Heloise Purpura, MD;  Location: [facility];  Service: Urology;  Laterality: Left;   CYSTOSCOPY W/ URETERAL STENT PLACEMENT Left 03/29/2018   Procedure: CYSTOSCOPY WITH STENT  EXCHANGE;  Surgeon: Heloise Purpura, MD;  Location: [facility];  Service: Urology;  Laterality: Left;   CYSTOSCOPY WITH RETROGRADE PYELOGRAM, URETEROSCOPY AND STENT PLACEMENT Left 12-29-2008   dr Laverle Patter  @[facility]    WITH BALLOON DILATION LEFT URETERAL  STRICTURE   CYSTOSCOPY WITH RETROGRADE PYELOGRAM, URETEROSCOPY AND STENT PLACEMENT Left 09-12-2008    dr Annabell Howells @[facility]    CYSTOSCOPY WITH RETROGRADE PYELOGRAM, URETEROSCOPY AND STENT PLACEMENT Left 03/23/2020   Procedure: CYSTOSCOPY WITH LEFT RETROGRADE PYELOGRAM, AND LEFT STENT REMOVAL;  Surgeon: Heloise Purpura, MD;  Location: [facility];  Service: Urology;  Laterality: Left;   CYSTOSCOPY WITH STENT PLACEMENT Left 07/26/2018   Procedure: CYSTOSCOPY WITH STENT CHANGE;  Surgeon: Heloise Purpura, MD;  Location: [facility];  Service: Urology;  Laterality: Left;   CYSTOSCOPY WITH STENT PLACEMENT Left 12/03/2018   Procedure: CYSTOSCOPY WITH STENT CHANGE;  Surgeon: Heloise Purpura, MD;  Location: [facility];  Service: Urology;  Laterality: Left;   CYSTOSCOPY WITH STENT PLACEMENT Left 04/22/2019   Procedure: CYSTOSCOPY WITH STENT CHANGE;  Surgeon: Heloise Purpura, MD;  Location[facility];  Service: Urology;  Laterality: Left;   CYSTOSCOPY WITH STENT PLACEMENT Left 10/03/2019   Procedure: CYSTOSCOPY WITH STENT CHANGE;  Surgeon: Heloise Purpura, MD;  Location: [facility];  Service: Urology;  Laterality: Left;   EYE SURGERY     FOOT SURGERY Left 06-23-2010   dr Lestine Box   left first and second toes   INSERTION OF MESH N/A 04/23/2013   Procedure: INSERTION OF MESH;  Surgeon: Ardeth Sportsman, MD;  Location: [facility];  Service: General;  Laterality: N/A;   JOINT REPLACEMENT     KNEE ARTHROSCOPY Right 04-27-2007  dr Simonne Come @[facility]    LAPAROSCOPIC LYSIS OF ADHESIONS N/A 04/23/2013   Procedure: LAPAROSCOPIC LYSIS OF ADHESIONS;  Surgeon: Ardeth Sportsman, MD;  Location: [facility];  Service: General;  Laterality: N/A;   LUMBAR SPINE SURGERY  1989 and 1999   POSTERIOR LUMBAR FUSION  12/17/2012   L3 -- L5 laminectomy and L3 -- 5 fusion   TOTAL HIP ARTHROPLASTY Left 07/04/2014   Procedure: LEFT TOTAL HIP ARTHROPLASTY ANTERIOR APPROACH;  Surgeon: Loanne Drilling, MD;  Location: [facility];  Service: Orthopedics;  Laterality: Left;    TOTAL HIP ARTHROPLASTY Right 2009   TOTAL KNEE ARTHROPLASTY Right 09-15-2009   dr Simonne Come @[facility]    VENTRAL HERNIA REPAIR N/A 07/19/2012   Procedure: LAPAROSCOPIC LYSIS OF ADHESIONS, SMALL BOWEL RESECTION, SEROSAL REPAIR, PRIMARY VENTRAL HERNIA REPAIR;  Surgeon: Ardeth Sportsman, MD;  Location: [facility];  Service: General;  Laterality: N/A;   VENTRAL HERNIA REPAIR N/A 04/23/2013   Procedure: LAPAROSCOPIC exploration and repair of hernia in abdominal VENTRAL wall  HERNIA;  Surgeon: Ardeth Sportsman, MD;  Location: [facility];  Service: General;  Laterality: N/A;    Family History  Problem Relation Age of Onset   Diabetes Mother    Lung cancer Mother    Diabetes Father    Liver cancer Father    Colon cancer Sister        close to 37 per pt   Diabetes Sister    Diabetes Sister  Hypertension Sister    Kidney disease Sister    Seizures Daughter    Diabetes Son    Breast cancer Neg Hx    Allergic rhinitis Neg Hx    Angioedema Neg Hx    Asthma Neg Hx    Atopy Neg Hx    Eczema Neg Hx    Immunodeficiency Neg Hx    Urticaria Neg Hx    Colon polyps Neg Hx    Esophageal cancer Neg Hx    Rectal cancer Neg Hx    Stomach cancer Neg Hx        Review of Systems  Constitutional:  Negative for diaphoresis.  Eyes:  Negative for pain.  Respiratory:  Negative for shortness of breath.   Cardiovascular:  Negative for chest pain, palpitations and leg swelling.  Gastrointestinal:  Negative for abdominal pain.  Endocrine: Negative for polydipsia.  Skin:  Negative for rash.  Neurological:  Negative for dizziness, weakness and headaches.  Hematological:  Does not bruise/bleed easily.  All other systems reviewed and are negative.      Objective:   Physical Exam Constitutional:      Appearance: Normal appearance. She is obese.  Cardiovascular:     Rate and Rhythm: Normal rate and regular rhythm.     Heart sounds: Normal heart sounds.  Pulmonary:     Breath sounds: Normal breath sounds.  Skin:     General: Skin is warm.  Neurological:     General: No focal deficit present.     Mental Status: She is alert and oriented to person, place, and time.  Psychiatric:        Mood and Affect: Mood normal.        Behavior: Behavior normal.     BP 139/73   Pulse (!) 55   Temp 97.9 F (36.6 C) (Temporal)   Resp 20   Ht 5\' 1"  (1.549 m)   Wt 169 lb (76.7 kg)   SpO2 94%   BMI 31.93 kg/m        Assessment & Plan:   [name] in today with chief complaint of Pain Management and Leg Pain   1. Primary osteoarthritis of hip, unspecified laterality 2. Chronic midline thoracic back pain Rest RTO prn - oxyCODONE-acetaminophen (PERCOCET) 7.5-325 MG tablet; Take 1 tablet by mouth 3 (three) times daily as needed for severe pain.  Dispense: 90 tablet; Refill: 0 - oxyCODONE-acetaminophen (PERCOCET) 7.5-325 MG tablet; Take 1 tablet by mouth 3 (three) times daily as needed for severe pain.  Dispense: 90 tablet; Refill: 0 - oxyCODONE-acetaminophen (PERCOCET) 7.5-325 MG tablet; Take 1 tablet by mouth 3 (three) times daily as needed for severe pain.  Dispense: 90 tablet; Refill: 0    The above assessment and management plan was discussed with the patient. The patient verbalized understanding of and has agreed to the management plan. Patient is aware to call the clinic if symptoms persist or worsen. Patient is aware when to return to the clinic for a follow-up visit. Patient educated on when it is appropriate to go to the emergency department.   Mary-Margaret Daphine Deutscher, FNP

## 2022-09-12 ENCOUNTER — Other Ambulatory Visit: Payer: Self-pay | Admitting: Nurse Practitioner

## 2022-09-12 MED ORDER — METHOCARBAMOL 500 MG PO TABS: 500.0000 mg | ORAL_TABLET | Freq: Every day | ORAL | 0 refills | Status: DC

## 2022-09-12 NOTE — Telephone Encounter (Signed)
From: Edwena Felty [place] To: Office of Mary-Margaret [place], [place] Sent: 09/10/2022 3:48 AM EDT Subject: Medication Renewal Request  Refills have been requested for the following medications:   methocarbamol (ROBAXIN) 500 MG tablet [Mary-Margaret Sevilla M[place]agh]  Preferred pharmacy: [facility] - [place], [place] - [address] Delivery method: [employer]

## 2022-09-13 DIAGNOSIS — N3020 Other chronic cystitis without hematuria: Secondary | ICD-10-CM | POA: Diagnosis not present

## 2022-09-13 DIAGNOSIS — N135 Crossing vessel and stricture of ureter without hydronephrosis: Secondary | ICD-10-CM | POA: Diagnosis not present

## 2022-10-06 DIAGNOSIS — M7062 Trochanteric bursitis, left hip: Secondary | ICD-10-CM | POA: Diagnosis not present

## 2022-10-06 DIAGNOSIS — M7061 Trochanteric bursitis, right hip: Secondary | ICD-10-CM | POA: Diagnosis not present

## 2022-11-04 ENCOUNTER — Other Ambulatory Visit: Payer: Self-pay | Admitting: Family Medicine

## 2022-11-04 DIAGNOSIS — S32009K Unspecified fracture of unspecified lumbar vertebra, subsequent encounter for fracture with nonunion: Secondary | ICD-10-CM

## 2022-11-04 MED ORDER — METHOCARBAMOL 500 MG PO TABS
500.0000 mg | ORAL_TABLET | Freq: Every day | ORAL | 0 refills | Status: DC
Start: 2022-11-04 — End: 2023-06-12

## 2022-11-18 ENCOUNTER — Other Ambulatory Visit: Payer: Self-pay | Admitting: Nurse Practitioner

## 2022-11-18 DIAGNOSIS — S32009K Unspecified fracture of unspecified lumbar vertebra, subsequent encounter for fracture with nonunion: Secondary | ICD-10-CM

## 2022-11-18 DIAGNOSIS — G8929 Other chronic pain: Secondary | ICD-10-CM

## 2022-11-22 DIAGNOSIS — N3020 Other chronic cystitis without hematuria: Secondary | ICD-10-CM | POA: Diagnosis not present

## 2022-12-04 ENCOUNTER — Other Ambulatory Visit: Payer: Self-pay | Admitting: Nurse Practitioner

## 2022-12-04 DIAGNOSIS — R600 Localized edema: Secondary | ICD-10-CM

## 2022-12-04 DIAGNOSIS — G8929 Other chronic pain: Secondary | ICD-10-CM

## 2022-12-05 ENCOUNTER — Telehealth: Payer: Self-pay | Admitting: Nurse Practitioner

## 2022-12-05 NOTE — Telephone Encounter (Signed)
  Prescription Request  12/05/2022  Is this a "Controlled Substance" medicine?   Have you seen your PCP in the last 2 weeks? Has appt for 12/12/22 but said she will be out before then. Per patient she will be out of medication on 8/1  If YES, route message to pool  -  If NO, patient needs to be scheduled for appointment.  What is the name of the medication or equipment? oxyCODONE-acetaminophen (PERCOCET) 7.5-325 MG tablet   Have you contacted your pharmacy to request a refill? yes   Which pharmacy would you like this sent to? [facility] in [place]   Patient notified that their request is being sent to the clinical staff for review and that they should receive a response within 2 business days.

## 2022-12-05 NOTE — Telephone Encounter (Signed)
Informed pt that requested medication is a controlled medication and that PCP is out of the office until Monday, the day of her appt and it has to be filled during her appointment. She understood but was not happy about it.

## 2022-12-06 DIAGNOSIS — L84 Corns and callosities: Secondary | ICD-10-CM | POA: Diagnosis not present

## 2022-12-06 DIAGNOSIS — I70203 Unspecified atherosclerosis of native arteries of extremities, bilateral legs: Secondary | ICD-10-CM | POA: Diagnosis not present

## 2022-12-06 DIAGNOSIS — B351 Tinea unguium: Secondary | ICD-10-CM | POA: Diagnosis not present

## 2022-12-06 DIAGNOSIS — M79676 Pain in unspecified toe(s): Secondary | ICD-10-CM | POA: Diagnosis not present

## 2022-12-12 ENCOUNTER — Ambulatory Visit (INDEPENDENT_AMBULATORY_CARE_PROVIDER_SITE_OTHER): Payer: Medicare HMO | Admitting: Nurse Practitioner

## 2022-12-12 ENCOUNTER — Encounter: Payer: Self-pay | Admitting: Nurse Practitioner

## 2022-12-12 VITALS — BP 146/69 | HR 68 | Temp 97.5°F | Resp 20 | Ht 61.0 in | Wt 167.0 lb

## 2022-12-12 DIAGNOSIS — I10 Essential (primary) hypertension: Secondary | ICD-10-CM | POA: Diagnosis not present

## 2022-12-12 DIAGNOSIS — F3341 Major depressive disorder, recurrent, in partial remission: Secondary | ICD-10-CM | POA: Diagnosis not present

## 2022-12-12 DIAGNOSIS — R600 Localized edema: Secondary | ICD-10-CM | POA: Diagnosis not present

## 2022-12-12 DIAGNOSIS — S32009K Unspecified fracture of unspecified lumbar vertebra, subsequent encounter for fracture with nonunion: Secondary | ICD-10-CM

## 2022-12-12 DIAGNOSIS — E785 Hyperlipidemia, unspecified: Secondary | ICD-10-CM

## 2022-12-12 DIAGNOSIS — Z23 Encounter for immunization: Secondary | ICD-10-CM

## 2022-12-12 MED ORDER — LISINOPRIL 20 MG PO TABS
20.0000 mg | ORAL_TABLET | Freq: Every day | ORAL | 1 refills | Status: DC
Start: 2022-12-12 — End: 2023-07-20

## 2022-12-12 MED ORDER — OXYCODONE-ACETAMINOPHEN 7.5-325 MG PO TABS: 1.0000 | ORAL_TABLET | Freq: Three times a day (TID) | ORAL | 0 refills | Status: AC | PRN

## 2022-12-12 MED ORDER — OXYCODONE-ACETAMINOPHEN 7.5-325 MG PO TABS: 1.0000 | ORAL_TABLET | Freq: Three times a day (TID) | ORAL | 0 refills | Status: DC | PRN

## 2022-12-12 MED ORDER — FUROSEMIDE 20 MG PO TABS
20.0000 mg | ORAL_TABLET | Freq: Every day | ORAL | 1 refills | Status: DC
Start: 2022-12-12 — End: 2023-06-05

## 2022-12-12 MED ORDER — PANTOPRAZOLE SODIUM 40 MG PO TBEC: DELAYED_RELEASE_TABLET | ORAL | 1 refills | Status: DC

## 2022-12-12 NOTE — Progress Notes (Signed)
Subjective:    Patient ID: [name], female    DOB: 05/21/1945, 77 y.o.   MRN: [id number]   Chief Complaint: medical management of chronic issues     HPI:  [name] is a 77 y.o. who identifies as a female who was assigned female at birth.   Social history: Lives with: by herself- family checks on her several times a week Work history: retired   Water engineer in today for follow up of the following chronic medical issues:  1. Primary hypertension No c/o chest pain , sob or headache. Does not check blood pressure at home. BP Readings from Last 3 Encounters:  09/09/22 139/73  07/26/22 (!) 142/85  06/13/22 132/72     2. Hyperlipidemia LDL goal <70 Does not really wtatch diet and does no dedicated exercise. Lab Results  Component Value Date   CHOL 155 06/13/2022   HDL 88 06/13/2022   LDLCALC 51 06/13/2022   TRIG 85 06/13/2022   CHOLHDL 1.8 06/13/2022     3. Peripheral edema Has ankle edema at the end of each day  4. Recurrent major depressive disorder, in partial remission (HCC) Is currently on no antidepressant    12/12/2022   11:20 AM 09/09/2022    9:58 AM 07/26/2022   12:29 PM 06/22/2022    9:19 AM 06/13/2022   10:06 AM  Depression screen PHQ 2/9  Decreased Interest 0 0 0 0 0  Down, Depressed, Hopeless 0 0 0 0 0  PHQ - 2 Score 0 0 0 0 0  Altered sleeping 2 0 0 0 0  Tired, decreased energy 1 1 0 0 0  Change in appetite 0 0 0 0 0  Feeling bad or failure about yourself  0 0 0 0 0  Trouble concentrating 0 0 0 0 0  Moving slowly or fidgety/restless 0 0 0 0 0  Suicidal thoughts 0 0 0 0 0  PHQ-9 Score 3 1 0 0 0  Difficult doing work/chores Not difficult at all Not difficult at all Not difficult at all Not difficult at all Not difficult at all     5. Pseudoarthrosis L3- L5 Pain assessment: Cause of pain- pseudoarthrosis Pain location- lower back Pain on scale of 1-10- 5-6/10 Frequency- daily What increases pain-nothing What makes pain Better-pain  meds help Effects on ADL - none Any change in general medical condition-none  Current opioids rx- percocet # meds rx- 60 Effectiveness of current meds-helps Adverse reactions from pain meds-none Morphine equivalent- 33.75  Pill count performed-No Last drug screen - 06/13/22 ( high risk q51m, moderate risk q61m, low risk yearly ) Urine drug screen today- No Was the [facility] reviewed- yes  If yes were their any concerning findings? - no   Overdose risk: 1    09/15/2020   10:41 AM  Opioid Risk   Alcohol 0  Illegal Drugs 0  Rx Drugs 0  Alcohol 0  Illegal Drugs 0  Rx Drugs 0  Age between 16-45 years  0  History of Preadolescent Sexual Abuse 0  Psychological Disease 0  Depression 0  Opioid Risk Tool Scoring 0  Opioid Risk Interpretation Low Risk     Pain contract signed on:06/15/22    New complaints: None today  Allergies  Allergen Reactions   Cashew Nut Oil Hives   Dog Epithelium Hives and Itching   Sulfa Antibiotics Other (See Comments)    "blistering lips and mouth sores"   Outpatient Encounter Medications as of 12/12/2022  Medication Sig   amitriptyline (ELAVIL) 25 MG tablet Take 1 tablet (25 mg total) by mouth at bedtime.   atorvastatin (LIPITOR) 40 MG tablet Take 1 tablet (40 mg total) by mouth daily.   furosemide (LASIX) 20 MG tablet Take 1 tablet (20 mg total) by mouth daily.   lisinopril (ZESTRIL) 20 MG tablet Take 1 tablet (20 mg total) by mouth daily.   meloxicam (MOBIC) 7.5 MG tablet Take 1 tablet by mouth once daily   methocarbamol (ROBAXIN) 500 MG tablet Take 1 tablet (500 mg total) by mouth at bedtime.   mupirocin ointment (BACTROBAN) 2 % Apply 1 Application topically as needed (for iritation).   nystatin cream (MYCOSTATIN) Apply 1 Application topically as needed for dry skin.   pantoprazole (PROTONIX) 40 MG tablet TAKE 1 TABLET BY MOUTH ONCE DAILY BEFORE SUPPER FOR STOMACH PAIN   No facility-administered encounter medications on file as of 12/12/2022.     Past Surgical History:  Procedure Laterality Date   ABDOMINAL HYSTERECTOMY  11/02/10  dr Duard Brady  @[facility]    TAH, BSO, incisional hernia repair , lysis of adhesions for endometrial cancer   ANTERIOR LUMBAR FUSION  01-28-2008   dr elsner  @[facility]    L2 -- L4   BACK SURGERY     BUNIONECTOMY Right 2003   CATARACT EXTRACTION W/ INTRAOCULAR LENS  IMPLANT, BILATERAL  2007   COLONOSCOPY  last one 12-24-2018   CYSTO/  LEFT RETROGRADE PYELOGRAM/ URETEROSCOPY STENT PLACEMENT/  OPEN URETEROLYSIS WITH OMENTAL FLAP Left 03-11-2009    dr borden  @[facility]    CYSTO/  [facility] SLING/  ANTERIOR REPAIR  12-06-2001    dr Annabell Howells @[facility]    CYSTOSCOPY W/ URETERAL STENT PLACEMENT Left 12/07/2017   Procedure: CYSTOSCOPY WITH RETROGRADE PYELOGRAM/URETERAL STENT PLACEMENT;  Surgeon: Heloise Purpura, MD;  Location: [facility];  Service: Urology;  Laterality: Left;   CYSTOSCOPY W/ URETERAL STENT PLACEMENT Left 03/29/2018   Procedure: CYSTOSCOPY WITH STENT  EXCHANGE;  Surgeon: Heloise Purpura, MD;  Location: [facility];  Service: Urology;  Laterality: Left;   CYSTOSCOPY WITH RETROGRADE PYELOGRAM, URETEROSCOPY AND STENT PLACEMENT Left 12-29-2008   dr Laverle Patter  @[facility]    WITH BALLOON DILATION LEFT URETERAL STRICTURE   CYSTOSCOPY WITH RETROGRADE PYELOGRAM, URETEROSCOPY AND STENT PLACEMENT Left 09-12-2008    dr Annabell Howells @[facility]    CYSTOSCOPY WITH RETROGRADE PYELOGRAM, URETEROSCOPY AND STENT PLACEMENT Left 03/23/2020   Procedure: CYSTOSCOPY WITH LEFT RETROGRADE PYELOGRAM, AND LEFT STENT REMOVAL;  Surgeon: Heloise Purpura, MD;  Location: [facility];  Service: Urology;  Laterality: Left;   CYSTOSCOPY WITH STENT PLACEMENT Left 07/26/2018   Procedure: CYSTOSCOPY WITH STENT CHANGE;  Surgeon: Heloise Purpura, MD;  Location: [facility];  Service: Urology;  Laterality: Left;   CYSTOSCOPY WITH STENT PLACEMENT Left 12/03/2018   Procedure: CYSTOSCOPY WITH STENT CHANGE;  Surgeon: Heloise Purpura, MD;  Location: [facility];  Service: Urology;  Laterality: Left;   CYSTOSCOPY WITH STENT  PLACEMENT Left 04/22/2019   Procedure: CYSTOSCOPY WITH STENT CHANGE;  Surgeon: Heloise Purpura, MD;  Location: [facility];  Service: Urology;  Laterality: Left;   CYSTOSCOPY WITH STENT PLACEMENT Left 10/03/2019   Procedure: CYSTOSCOPY WITH STENT CHANGE;  Surgeon: Heloise Purpura, MD;  Location: [facility];  Service: Urology;  Laterality: Left;   EYE SURGERY     FOOT SURGERY Left 06-23-2010   dr Lestine Box   left first and second toes   INSERTION OF MESH N/A 04/23/2013   Procedure: INSERTION OF MESH;  Surgeon: Ardeth Sportsman, MD;  Location: [facility];  Service: General;  Laterality: N/A;   JOINT REPLACEMENT     KNEE ARTHROSCOPY Right 04-27-2007  dr Simonne Come @[facility]    LAPAROSCOPIC LYSIS OF ADHESIONS N/A 04/23/2013   Procedure: LAPAROSCOPIC LYSIS OF ADHESIONS;  Surgeon: Ardeth Sportsman, MD;  Location: [facility];  Service: General;  Laterality: N/A;   LUMBAR SPINE SURGERY  1989 and 1999   POSTERIOR LUMBAR FUSION  12/17/2012   L3 -- L5 laminectomy and L3 -- 5 fusion   TOTAL HIP ARTHROPLASTY Left 07/04/2014   Procedure: LEFT TOTAL HIP ARTHROPLASTY ANTERIOR APPROACH;  Surgeon: Loanne Drilling, MD;  Location: [facility];  Service: Orthopedics;  Laterality: Left;   TOTAL HIP ARTHROPLASTY Right 2009   TOTAL KNEE ARTHROPLASTY Right 09-15-2009   dr Simonne Come @[facility]    VENTRAL HERNIA REPAIR N/A 07/19/2012   Procedure: LAPAROSCOPIC LYSIS OF ADHESIONS, SMALL BOWEL RESECTION, SEROSAL REPAIR, PRIMARY VENTRAL HERNIA REPAIR;  Surgeon: Ardeth Sportsman, MD;  Location: [facility];  Service: General;  Laterality: N/A;   VENTRAL HERNIA REPAIR N/A 04/23/2013   Procedure: LAPAROSCOPIC exploration and repair of hernia in abdominal VENTRAL wall  HERNIA;  Surgeon: Ardeth Sportsman, MD;  Location: [facility];  Service: General;  Laterality: N/A;    Family History  Problem Relation Age of Onset   Diabetes Mother    Lung cancer Mother    Diabetes Father    Liver cancer Father    Colon cancer Sister        close to 34 per pt    Diabetes Sister    Diabetes Sister    Hypertension Sister    Kidney disease Sister    Seizures Daughter    Diabetes Son    Breast cancer Neg Hx    Allergic rhinitis Neg Hx    Angioedema Neg Hx    Asthma Neg Hx    Atopy Neg Hx    Eczema Neg Hx    Immunodeficiency Neg Hx    Urticaria Neg Hx    Colon polyps Neg Hx    Esophageal cancer Neg Hx    Rectal cancer Neg Hx    Stomach cancer Neg Hx        Review of Systems  Constitutional:  Negative for diaphoresis.  Eyes:  Negative for pain.  Respiratory:  Negative for shortness of breath.   Cardiovascular:  Negative for chest pain, palpitations and leg swelling.  Gastrointestinal:  Negative for abdominal pain.  Endocrine: Negative for polydipsia.  Skin:  Negative for rash.  Neurological:  Negative for dizziness, weakness and headaches.  Hematological:  Does not bruise/bleed easily.  All other systems reviewed and are negative.      Objective:   Physical Exam Vitals and nursing note reviewed.  Constitutional:      General: She is not in acute distress.    Appearance: Normal appearance. She is well-developed.  HENT:     Head: Normocephalic.     Right Ear: Tympanic membrane normal.     Left Ear: Tympanic membrane normal.     Nose: Nose normal.     Mouth/Throat:     Mouth: Mucous membranes are moist.  Eyes:     Pupils: Pupils are equal, round, and reactive to light.  Neck:     Vascular: No carotid bruit or JVD.  Cardiovascular:     Rate and Rhythm: Normal rate and regular rhythm.     Heart sounds: Normal heart sounds.  Pulmonary:     Effort: Pulmonary effort is normal. No respiratory distress.  Breath sounds: Normal breath sounds. No wheezing or rales.  Chest:     Chest wall: No tenderness.  Abdominal:     General: Bowel sounds are normal. There is no distension or abdominal bruit.     Palpations: Abdomen is soft. There is no hepatomegaly, splenomegaly, mass or pulsatile mass.     Tenderness: There is no  abdominal tenderness.  Musculoskeletal:        General: Normal range of motion.     Cervical back: Normal range of motion and neck supple.  Lymphadenopathy:     Cervical: No cervical adenopathy.  Skin:    General: Skin is warm and dry.  Neurological:     Mental Status: She is alert and oriented to person, place, and time.     Deep Tendon Reflexes: Reflexes are normal and symmetric.  Psychiatric:        Behavior: Behavior normal.        Thought Content: Thought content normal.        Judgment: Judgment normal.    BP (!) 146/69   Pulse 68   Temp (!) 97.5 F (36.4 C) (Temporal)   Resp 20   Ht 5\' 1"  (1.549 m)   Wt 167 lb (75.8 kg)   SpO2 95%   BMI 31.55 kg/m         Assessment & Plan:   [name] comes in today with chief complaint of Medical Management of Chronic Issues   Diagnosis and orders addressed:  1. Primary hypertension Low sodium diet - lisinopril (ZESTRIL) 20 MG tablet; Take 1 tablet (20 mg total) by mouth daily.  Dispense: 90 tablet; Refill: 1 - CBC with Differential/Platelet - CMP14+EGFR  2. Hyperlipidemia LDL goal <70 Low fat diet - Lipid panel  3. Peripheral edema Elevate legs when sitting - furosemide (LASIX) 20 MG tablet; Take 1 tablet (20 mg total) by mouth daily.  Dispense: 90 tablet; Refill: 1  4. Recurrent major depressive disorder, in partial remission (HCC) Stress management  5. Pseudoarthrosis L3- L5 Pain meds Meds ordered this encounter  Medications   oxyCODONE-acetaminophen (PERCOCET) 7.5-325 MG tablet    Sig: Take 1 tablet by mouth every 8 (eight) hours as needed for severe pain.    Dispense:  90 tablet    Refill:  0    Order Specific Question:   Supervising Provider    Answer:   Arville Care A [[id number]]   oxyCODONE-acetaminophen (PERCOCET) 7.5-325 MG tablet    Sig: Take 1 tablet by mouth every 8 (eight) hours as needed for severe pain.    Dispense:  90 tablet    Refill:  0    Order Specific Question:    Supervising Provider    Answer:   Arville Care A [[id number]]   oxyCODONE-acetaminophen (PERCOCET) 7.5-325 MG tablet    Sig: Take 1 tablet by mouth every 8 (eight) hours as needed for severe pain.    Dispense:  90 tablet    Refill:  0    Order Specific Question:   Supervising Provider    Answer:   Arville Care A [id number]      Labs pending Health Maintenance reviewed Diet and exercise encouraged  Follow up plan: 3 months   Mary-Margaret Daphine Deutscher, FNP

## 2022-12-12 NOTE — Patient Instructions (Signed)
Acute Back Pain, Adult Acute back pain is sudden and usually short-lived. It is often caused by an injury to the muscles and tissues in the back. The injury may result from: A muscle, tendon, or ligament getting overstretched or torn. Ligaments are tissues that connect bones to each other. Lifting something improperly can cause a back strain. Wear and tear (degeneration) of the spinal disks. Spinal disks are circular tissue that provide cushioning between the bones of the spine (vertebrae). Twisting motions, such as while playing sports or doing yard work. A hit to the back. Arthritis. You may have a physical exam, lab tests, and imaging tests to find the cause of your pain. Acute back pain usually goes away with rest and home care. Follow these instructions at home: Managing pain, stiffness, and swelling Take over-the-counter and prescription medicines only as told by your health care provider. Treatment may include medicines for pain and inflammation that are taken by mouth or applied to the skin, or muscle relaxants. Your health care provider may recommend applying ice during the first 24-48 hours after your pain starts. To do this: Put ice in a plastic bag. Place a towel between your skin and the bag. Leave the ice on for 20 minutes, 2-3 times a day. Remove the ice if your skin turns bright red. This is very important. If you cannot feel pain, heat, or cold, you have a greater risk of damage to the area. If directed, apply heat to the affected area as often as told by your health care provider. Use the heat source that your health care provider recommends, such as a moist heat pack or a heating pad. Place a towel between your skin and the heat source. Leave the heat on for 20-30 minutes. Remove the heat if your skin turns bright red. This is especially important if you are unable to feel pain, heat, or cold. You have a greater risk of getting burned. Activity  Do not stay in bed. Staying in  bed for more than 1-2 days can delay your recovery. Sit up and stand up straight. Avoid leaning forward when you sit or hunching over when you stand. If you work at a desk, sit close to it so you do not need to lean over. Keep your chin tucked in. Keep your neck drawn back, and keep your elbows bent at a 90-degree angle (right angle). Sit high and close to the steering wheel when you drive. Add lower back (lumbar) support to your car seat, if needed. Take short walks on even surfaces as soon as you are able. Try to increase the length of time you walk each day. Do not sit, drive, or stand in one place for more than 30 minutes at a time. Sitting or standing for long periods of time can put stress on your back. Do not drive or use heavy machinery while taking prescription pain medicine. Use proper lifting techniques. When you bend and lift, use positions that put less stress on your back: Bend your knees. Keep the load close to your body. Avoid twisting. Exercise regularly as told by your health care provider. Exercising helps your back heal faster and helps prevent back injuries by keeping muscles strong and flexible. Work with a physical therapist to make a safe exercise program, as recommended by your health care provider. Do any exercises as told by your physical therapist. Lifestyle Maintain a healthy weight. Extra weight puts stress on your back and makes it difficult to have good   posture. Avoid activities or situations that make you feel anxious or stressed. Stress and anxiety increase muscle tension and can make back pain worse. Learn ways to manage anxiety and stress, such as through exercise. General instructions Sleep on a firm mattress in a comfortable position. Try lying on your side with your knees slightly bent. If you lie on your back, put a pillow under your knees. Keep your head and neck in a straight line with your spine (neutral position) when using electronic equipment like  smartphones or pads. To do this: Raise your smartphone or pad to look at it instead of bending your head or neck to look down. Put the smartphone or pad at the level of your face while looking at the screen. Follow your treatment plan as told by your health care provider. This may include: Cognitive or behavioral therapy. Acupuncture or massage therapy. Meditation or yoga. Contact a health care provider if: You have pain that is not relieved with rest or medicine. You have increasing pain going down into your legs or buttocks. Your pain does not improve after 2 weeks. You have pain at night. You lose weight without trying. You have a fever or chills. You develop nausea or vomiting. You develop abdominal pain. Get help right away if: You develop new bowel or bladder control problems. You have unusual weakness or numbness in your arms or legs. You feel faint. These symptoms may represent a serious problem that is an emergency. Do not wait to see if the symptoms will go away. Get medical help right away. Call your local emergency services (911 in the [place] Do not drive yourself to the hospital. Summary Acute back pain is sudden and usually short-lived. Use proper lifting techniques. When you bend and lift, use positions that put less stress on your back. Take over-the-counter and prescription medicines only as told by your health care provider, and apply heat or ice as told. This information is not intended to replace advice given to you by your health care provider. Make sure you discuss any questions you have with your health care provider. Document Revised: 07/17/2020 Document Reviewed: 07/17/2020 Elsevier Patient Education  2024 [employer] Inc[employer]  

## 2022-12-13 ENCOUNTER — Telehealth: Payer: Self-pay

## 2022-12-13 NOTE — Telephone Encounter (Signed)
FYI, patient called to report since having shingles vaccine yesterday she has been experiencing pain in the arm and hurts to move arm.  Patient has not taken anything other than her regular pain medication, Oxycodone, for the pain.  Patient was advised to apply ice to arm and to call us in the morning for an appointment if symptoms have not improved.

## 2022-12-14 NOTE — Addendum Note (Signed)
Addended by: Cleda Daub on: 12/14/2022 08:58 AM   Modules accepted: Orders

## 2022-12-16 ENCOUNTER — Other Ambulatory Visit: Payer: Self-pay | Admitting: Family Medicine

## 2022-12-16 DIAGNOSIS — S32009K Unspecified fracture of unspecified lumbar vertebra, subsequent encounter for fracture with nonunion: Secondary | ICD-10-CM

## 2022-12-16 NOTE — Telephone Encounter (Signed)
Last office visit 12/12/22 Last refill 10/22/22 #15 no refills

## 2022-12-23 ENCOUNTER — Ambulatory Visit: Payer: Medicare HMO | Admitting: Nurse Practitioner

## 2023-01-12 DIAGNOSIS — M7061 Trochanteric bursitis, right hip: Secondary | ICD-10-CM | POA: Diagnosis not present

## 2023-01-12 DIAGNOSIS — M7062 Trochanteric bursitis, left hip: Secondary | ICD-10-CM | POA: Diagnosis not present

## 2023-01-24 DIAGNOSIS — N3020 Other chronic cystitis without hematuria: Secondary | ICD-10-CM | POA: Diagnosis not present

## 2023-02-27 DIAGNOSIS — H353131 Nonexudative age-related macular degeneration, bilateral, early dry stage: Secondary | ICD-10-CM | POA: Diagnosis not present

## 2023-02-28 ENCOUNTER — Other Ambulatory Visit: Payer: Self-pay | Admitting: Nurse Practitioner

## 2023-02-28 DIAGNOSIS — G8929 Other chronic pain: Secondary | ICD-10-CM

## 2023-03-07 ENCOUNTER — Encounter: Payer: Self-pay | Admitting: Nurse Practitioner

## 2023-03-07 ENCOUNTER — Ambulatory Visit (INDEPENDENT_AMBULATORY_CARE_PROVIDER_SITE_OTHER): Payer: Medicare HMO | Admitting: Nurse Practitioner

## 2023-03-07 VITALS — BP 139/68 | HR 57 | Temp 97.1°F | Resp 20 | Ht 61.0 in | Wt 170.0 lb

## 2023-03-07 DIAGNOSIS — E785 Hyperlipidemia, unspecified: Secondary | ICD-10-CM

## 2023-03-07 DIAGNOSIS — E669 Obesity, unspecified: Secondary | ICD-10-CM | POA: Diagnosis not present

## 2023-03-07 DIAGNOSIS — Z23 Encounter for immunization: Secondary | ICD-10-CM | POA: Diagnosis not present

## 2023-03-07 DIAGNOSIS — R600 Localized edema: Secondary | ICD-10-CM

## 2023-03-07 DIAGNOSIS — I10 Essential (primary) hypertension: Secondary | ICD-10-CM | POA: Diagnosis not present

## 2023-03-07 DIAGNOSIS — S32009K Unspecified fracture of unspecified lumbar vertebra, subsequent encounter for fracture with nonunion: Secondary | ICD-10-CM

## 2023-03-07 DIAGNOSIS — G629 Polyneuropathy, unspecified: Secondary | ICD-10-CM | POA: Diagnosis not present

## 2023-03-07 LAB — CBC WITH DIFFERENTIAL/PLATELET
Basophils Absolute: 0 10*3/uL (ref 0.0–0.2)
Basos: 0 %
EOS (ABSOLUTE): 0.1 10*3/uL (ref 0.0–0.4)
Eos: 1 %
Hematocrit: 40.2 % (ref 34.0–46.6)
Hemoglobin: 13 g/dL (ref 11.1–15.9)
Immature Grans (Abs): 0 10*3/uL (ref 0.0–0.1)
Immature Granulocytes: 0 %
Lymphocytes Absolute: 2.1 10*3/uL (ref 0.7–3.1)
Lymphs: 32 %
MCH: 32.3 pg (ref 26.6–33.0)
MCHC: 32.3 g/dL (ref 31.5–35.7)
MCV: 100 fL — ABNORMAL HIGH (ref 79–97)
Monocytes Absolute: 0.5 10*3/uL (ref 0.1–0.9)
Monocytes: 8 %
Neutrophils Absolute: 4 10*3/uL (ref 1.4–7.0)
Neutrophils: 59 %
Platelets: 299 10*3/uL (ref 150–450)
RBC: 4.02 x10E6/uL (ref 3.77–5.28)
RDW: 11.5 % — ABNORMAL LOW (ref 11.7–15.4)
WBC: 6.7 10*3/uL (ref 3.4–10.8)

## 2023-03-07 LAB — CMP14+EGFR
ALT: 20 [IU]/L (ref 0–32)
AST: 24 [IU]/L (ref 0–40)
Albumin: 4.4 g/dL (ref 3.8–4.8)
Alkaline Phosphatase: 60 [IU]/L (ref 44–121)
BUN/Creatinine Ratio: 14 (ref 12–28)
BUN: 12 mg/dL (ref 8–27)
Bilirubin Total: 0.6 mg/dL (ref 0.0–1.2)
CO2: 24 mmol/L (ref 20–29)
Calcium: 9.4 mg/dL (ref 8.7–10.3)
Chloride: 103 mmol/L (ref 96–106)
Creatinine, Ser: 0.84 mg/dL (ref 0.57–1.00)
Globulin, Total: 2.2 g/dL (ref 1.5–4.5)
Glucose: 87 mg/dL (ref 70–99)
Potassium: 4.3 mmol/L (ref 3.5–5.2)
Sodium: 140 mmol/L (ref 134–144)
Total Protein: 6.6 g/dL (ref 6.0–8.5)
eGFR: 71 mL/min/{1.73_m2} (ref >59)

## 2023-03-07 LAB — LIPID PANEL
Chol/HDL Ratio: 1.7 ratio (ref 0.0–4.4)
Cholesterol, Total: 146 mg/dL (ref 100–199)
HDL: 86 mg/dL (ref >39)
LDL Chol Calc (NIH): 44 mg/dL (ref 0–99)
Triglycerides: 83 mg/dL (ref 0–149)
VLDL Cholesterol Cal: 16 mg/dL (ref 5–40)

## 2023-03-07 MED ORDER — ATORVASTATIN CALCIUM 40 MG PO TABS: 40.0000 mg | ORAL_TABLET | Freq: Every day | ORAL | 1 refills | Status: AC

## 2023-03-07 MED ORDER — MELOXICAM 7.5 MG PO TABS: 7.5000 mg | ORAL_TABLET | Freq: Every day | ORAL | 0 refills | Status: DC

## 2023-03-07 MED ORDER — OXYCODONE-ACETAMINOPHEN 7.5-325 MG PO TABS
1.0000 | ORAL_TABLET | Freq: Three times a day (TID) | ORAL | 0 refills | Status: DC | PRN
Start: 2023-05-12 — End: 2023-06-08

## 2023-03-07 MED ORDER — OXYCODONE-ACETAMINOPHEN 7.5-325 MG PO TABS
1.0000 | ORAL_TABLET | Freq: Three times a day (TID) | ORAL | 0 refills | Status: AC | PRN
Start: 2023-04-11 — End: 2023-05-11

## 2023-03-07 MED ORDER — OXYCODONE-ACETAMINOPHEN 7.5-325 MG PO TABS: 1.0000 | ORAL_TABLET | Freq: Three times a day (TID) | ORAL | 0 refills | Status: AC | PRN

## 2023-03-07 NOTE — Patient Instructions (Signed)
Peripheral Edema  Peripheral edema is swelling that is caused by a buildup of fluid. Peripheral edema most often affects the lower legs, ankles, and feet. It can also develop in the arms, hands, and face. The area of the body that has peripheral edema will look swollen. It may also feel heavy or warm. Your clothes may start to feel tight. Pressing on the area may make a temporary dent in your skin (pitting edema). You may not be able to move your swollen arm or leg as much as usual. There are many causes of peripheral edema. It can happen because of a complication of other conditions such as heart failure, kidney disease, or a problem with your circulation. It also can be a side effect of certain medicines or happen because of an infection. It often happens to women during pregnancy. Sometimes, the cause is not known. Follow these instructions at home: Managing pain, stiffness, and swelling  Raise (elevate) your legs while you are sitting or lying down. Move around often to prevent stiffness and to reduce swelling. Do not sit or stand for long periods of time. Do not wear tight clothing. Do not wear garters on your upper legs. Exercise your legs to get your circulation going. This helps to move the fluid back into your blood vessels, and it may help the swelling go down. Wear compression stockings as told by your health care provider. These stockings help to prevent blood clots and reduce swelling in your legs. It is important that these are the correct size. These stockings should be prescribed by your doctor to prevent possible injuries. If elastic bandages or wraps are recommended, use them as told by your health care provider. Medicines Take over-the-counter and prescription medicines only as told by your health care provider. Your health care provider may prescribe medicine to help your body get rid of excess water (diuretic). Take this medicine if you are told to take it. General  instructions Eat a low-salt (low-sodium) diet as told by your health care provider. Sometimes, eating less salt may reduce swelling. Pay attention to any changes in your symptoms. Moisturize your skin daily to help prevent skin from cracking and draining. Keep all follow-up visits. This is important. Contact a health care provider if: You have a fever. You have swelling in only one leg. You have increased swelling, redness, or pain in one or both of your legs. You have drainage or sores at the area where you have edema. Get help right away if: You have edema that starts suddenly or is getting worse, especially if you are pregnant or have a medical condition. You develop shortness of breath, especially when you are lying down. You have pain in your chest or abdomen. You feel weak. You feel like you will faint. These symptoms may be an emergency. Get help right away. Call 911. Do not wait to see if the symptoms will go away. Do not drive yourself to the hospital. Summary Peripheral edema is swelling that is caused by a buildup of fluid. Peripheral edema most often affects the lower legs, ankles, and feet. Move around often to prevent stiffness and to reduce swelling. Do not sit or stand for long periods of time. Pay attention to any changes in your symptoms. Contact a health care provider if you have edema that starts suddenly or is getting worse, especially if you are pregnant or have a medical condition. Get help right away if you develop shortness of breath, especially when lying down.   This information is not intended to replace advice given to you by your health care provider. Make sure you discuss any questions you have with your health care provider. Document Revised: 12/28/2020 Document Reviewed: 12/28/2020 Elsevier Patient Education  2024 [employer]  

## 2023-03-07 NOTE — Progress Notes (Signed)
Subjective:    Patient ID: [name], female    DOB: Sep 15, 1945, 77 y.o.   MRN: [id number]   Chief Complaint: medical management of chronic issues     HPI:  [name] is a 77 y.o. who identifies as a female who was assigned female at birth.   Social history: Lives with: by herself Work history: retired   Water engineer in today for follow up of the following chronic medical issues:  1. Primary hypertension No c/o chest pain, sob or headache. Does not check blood pressure at home. BP Readings from Last 3 Encounters:  12/12/22 (!) 146/69  09/09/22 139/73  07/26/22 (!) 142/85     2. Hyperlipidemia LDL goal <70 Does not really watch diet and does no dedicated exercise. Lab Results  Component Value Date   CHOL 147 12/12/2022   HDL 78 12/12/2022   LDLCALC 54 12/12/2022   TRIG 79 12/12/2022   CHOLHDL 1.9 12/12/2022     3. Peripheral polyneuropathy Has numbness in bil feet at times  4. Peripheral edema Has some swelling in ankles by the endof the day. Herlasix works well for her.  5. GERD Takes protonix daily and is doing well  6. Pseudoarthrosis L3- L5 Pain assessment: Cause of pain- pseudoarthrosis Pain location- lower back Pain on scale of 1-10- 5-6/10 Frequency- daily What increases pain-nothing really What makes pain Better-pain meds help Effects on ADL - none Any change in general medical condition-none  Current opioids rx- percocet 7.5/325-  # meds rx- 90 Effectiveness of current meds-helps when needed Adverse reactions from pain meds-none Morphine equivalent- 33.75MME  Pill count performed-No Last drug screen - 06/13/22 ( high risk q61m, moderate risk q51m, low risk yearly ) Urine drug screen today- No Was the NCCSR reviewed- yes  If yes were their any concerning findings? - no   Overdose risk: 1    09/15/2020   10:41 AM  Opioid Risk   Alcohol 0  Illegal Drugs 0  Rx Drugs 0  Alcohol 0  Illegal Drugs 0  Rx Drugs 0  Age between  16-45 years  0  History of Preadolescent Sexual Abuse 0  Psychological Disease 0  Depression 0  Opioid Risk Tool Scoring 0  Opioid Risk Interpretation Low Risk     Pain contract signed on:06/15/22   6. Obesity (BMI 30-39.9) Wt Readings from Last 3 Encounters:  03/07/23 170 lb (77.1 kg)  12/12/22 167 lb (75.8 kg)  09/09/22 169 lb (76.7 kg)   BMI Readings from Last 3 Encounters:  03/07/23 32.12 kg/m  12/12/22 31.55 kg/m  09/09/22 31.93 kg/m      New complaints: None today  Allergies  Allergen Reactions   Cashew Nut Oil Hives   Dog Epithelium Hives and Itching   Sulfa Antibiotics Other (See Comments)    "blistering lips and mouth sores"   Outpatient Encounter Medications as of 03/07/2023  Medication Sig   atorvastatin (LIPITOR) 40 MG tablet Take 1 tablet (40 mg total) by mouth daily.   furosemide (LASIX) 20 MG tablet Take 1 tablet (20 mg total) by mouth daily.   lisinopril (ZESTRIL) 20 MG tablet Take 1 tablet (20 mg total) by mouth daily.   meloxicam (MOBIC) 7.5 MG tablet Take 1 tablet by mouth once daily   methocarbamol (ROBAXIN) 500 MG tablet Take 1 tablet (500 mg total) by mouth at bedtime.   mupirocin ointment (BACTROBAN) 2 % Apply 1 Application topically as needed (for iritation).   nystatin cream (MYCOSTATIN)  Apply 1 Application topically as needed for dry skin.   oxyCODONE-acetaminophen (PERCOCET) 7.5-325 MG tablet Take 1 tablet by mouth every 8 (eight) hours as needed for severe pain.   pantoprazole (PROTONIX) 40 MG tablet TAKE 1 TABLET BY MOUTH ONCE DAILY BEFORE SUPPER FOR STOMACH PAIN   No facility-administered encounter medications on file as of 03/07/2023.    Past Surgical History:  Procedure Laterality Date   ABDOMINAL HYSTERECTOMY  11/02/10  dr Duard Brady  @[facility]    TAH, BSO, incisional hernia repair , lysis of adhesions for endometrial cancer   ANTERIOR LUMBAR FUSION  01-28-2008   dr elsner  @[facility]    L2 -- L4   BACK SURGERY     BUNIONECTOMY Right 2003    CATARACT EXTRACTION W/ INTRAOCULAR LENS  IMPLANT, BILATERAL  2007   COLONOSCOPY  last one 12-24-2018   CYSTO/  LEFT RETROGRADE PYELOGRAM/ URETEROSCOPY STENT PLACEMENT/  OPEN URETEROLYSIS WITH OMENTAL FLAP Left 03-11-2009    dr borden  @[facility]    CYSTO/  [facility]/  ANTERIOR REPAIR  12-06-2001    dr Annabell Howells @[facility]    CYSTOSCOPY W/ URETERAL STENT PLACEMENT Left 12/07/2017   Procedure: CYSTOSCOPY WITH RETROGRADE PYELOGRAM/URETERAL STENT PLACEMENT;  Surgeon: Heloise Purpura, MD;  Location: [facility];  Service: Urology;  Laterality: Left;   CYSTOSCOPY W/ URETERAL STENT PLACEMENT Left 03/29/2018   Procedure: CYSTOSCOPY WITH STENT  EXCHANGE;  Surgeon: Heloise Purpura, MD;  Location: [facility];  Service: Urology;  Laterality: Left;   CYSTOSCOPY WITH RETROGRADE PYELOGRAM, URETEROSCOPY AND STENT PLACEMENT Left 12-29-2008   dr Laverle Patter  @[facility]    WITH BALLOON DILATION LEFT URETERAL STRICTURE   CYSTOSCOPY WITH RETROGRADE PYELOGRAM, URETEROSCOPY AND STENT PLACEMENT Left 09-12-2008    dr Annabell Howells @[facility]    CYSTOSCOPY WITH RETROGRADE PYELOGRAM, URETEROSCOPY AND STENT PLACEMENT Left 03/23/2020   Procedure: CYSTOSCOPY WITH LEFT RETROGRADE PYELOGRAM, AND LEFT STENT REMOVAL;  Surgeon: Heloise Purpura, MD;  Location: [facility];  Service: Urology;  Laterality: Left;   CYSTOSCOPY WITH STENT PLACEMENT Left 07/26/2018   Procedure: CYSTOSCOPY WITH STENT CHANGE;  Surgeon: Heloise Purpura, MD;  Location: [facility];  Service: Urology;  Laterality: Left;   CYSTOSCOPY WITH STENT PLACEMENT Left 12/03/2018   Procedure: CYSTOSCOPY WITH STENT CHANGE;  Surgeon: Heloise Purpura, MD;  Location: [facility];  Service: Urology;  Laterality: Left;   CYSTOSCOPY WITH STENT PLACEMENT Left 04/22/2019   Procedure: CYSTOSCOPY WITH STENT CHANGE;  Surgeon: Heloise Purpura, MD;  Location[facility];  Service: Urology;  Laterality: Left;   CYSTOSCOPY WITH STENT PLACEMENT Left 10/03/2019   Procedure: CYSTOSCOPY WITH STENT CHANGE;  Surgeon: Heloise Purpura, MD;  Location: [facility];  Service: Urology;  Laterality: Left;   EYE SURGERY     FOOT SURGERY Left 06-23-2010   dr Lestine Box   left first and second toes   INSERTION OF MESH N/A 04/23/2013   Procedure: INSERTION OF MESH;  Surgeon: Ardeth Sportsman, MD;  Location: [facility];  Service: General;  Laterality: N/A;   JOINT REPLACEMENT     KNEE ARTHROSCOPY Right 04-27-2007  dr Simonne Come @[facility]    LAPAROSCOPIC LYSIS OF ADHESIONS N/A 04/23/2013   Procedure: LAPAROSCOPIC LYSIS OF ADHESIONS;  Surgeon: Ardeth Sportsman, MD;  Location: [facility];  Service: General;  Laterality: N/A;   LUMBAR SPINE SURGERY  1989 and 1999   POSTERIOR LUMBAR FUSION  12/17/2012   L3 -- L5 laminectomy and L3 -- 5 fusion   TOTAL HIP ARTHROPLASTY Left 07/04/2014   Procedure: LEFT TOTAL HIP ARTHROPLASTY ANTERIOR APPROACH;  Surgeon: Loanne Drilling, MD;  Location: [facility];  Service: Orthopedics;  Laterality: Left;   TOTAL HIP ARTHROPLASTY Right 2009   TOTAL KNEE ARTHROPLASTY Right 09-15-2009   dr Simonne Come @[facility]    VENTRAL HERNIA REPAIR N/A 07/19/2012   Procedure: LAPAROSCOPIC LYSIS OF ADHESIONS, SMALL BOWEL RESECTION, SEROSAL REPAIR, PRIMARY VENTRAL HERNIA REPAIR;  Surgeon: Ardeth Sportsman, MD;  Location: [facility];  Service: General;  Laterality: N/A;   VENTRAL HERNIA REPAIR N/A 04/23/2013   Procedure: LAPAROSCOPIC exploration and repair of hernia in abdominal VENTRAL wall  HERNIA;  Surgeon: Ardeth Sportsman, MD;  Location: [facility];  Service: General;  Laterality: N/A;    Family History  Problem Relation Age of Onset   Diabetes Mother    Lung cancer Mother    Diabetes Father    Liver cancer Father    Colon cancer Sister        close to 57 per pt   Diabetes Sister    Diabetes Sister    Hypertension Sister    Kidney disease Sister    Seizures Daughter    Diabetes Son    Breast cancer Neg Hx    Allergic rhinitis Neg Hx    Angioedema Neg Hx    Asthma Neg Hx    Atopy Neg Hx    Eczema Neg Hx    Immunodeficiency Neg Hx    Urticaria Neg Hx    Colon polyps  Neg Hx    Esophageal cancer Neg Hx    Rectal cancer Neg Hx    Stomach cancer Neg Hx        Review of Systems  Constitutional:  Negative for diaphoresis.  Eyes:  Negative for pain.  Respiratory:  Negative for shortness of breath.   Cardiovascular:  Negative for chest pain, palpitations and leg swelling.  Gastrointestinal:  Negative for abdominal pain.  Endocrine: Negative for polydipsia.  Skin:  Negative for rash.  Neurological:  Negative for dizziness, weakness and headaches.  Hematological:  Does not bruise/bleed easily.  All other systems reviewed and are negative.      Objective:   Physical Exam Vitals and nursing note reviewed.  Constitutional:      General: She is not in acute distress.    Appearance: Normal appearance. She is well-developed.  HENT:     Head: Normocephalic.     Right Ear: Tympanic membrane normal.     Left Ear: Tympanic membrane normal.     Nose: Nose normal.     Mouth/Throat:     Mouth: Mucous membranes are moist.  Eyes:     Pupils: Pupils are equal, round, and reactive to light.  Neck:     Vascular: No carotid bruit or JVD.  Cardiovascular:     Rate and Rhythm: Normal rate and regular rhythm.     Heart sounds: Normal heart sounds.  Pulmonary:     Effort: Pulmonary effort is normal. No respiratory distress.     Breath sounds: Normal breath sounds. No wheezing or rales.  Chest:     Chest wall: No tenderness.  Abdominal:     General: Bowel sounds are normal. There is no distension or abdominal bruit.     Palpations: Abdomen is soft. There is no hepatomegaly, splenomegaly, mass or pulsatile mass.     Tenderness: There is no abdominal tenderness.  Musculoskeletal:        General: Normal range of motion.     Cervical back: Normal range of motion and neck supple.  Right lower leg: Edema (1+) present.     Left lower leg: Edema (1+) present.  Lymphadenopathy:     Cervical: No cervical adenopathy.  Skin:    General: Skin is warm and dry.   Neurological:     Mental Status: She is alert and oriented to person, place, and time.     Deep Tendon Reflexes: Reflexes are normal and symmetric.  Psychiatric:        Behavior: Behavior normal.        Thought Content: Thought content normal.        Judgment: Judgment normal.    BP 139/68   Pulse (!) 57   Temp (!) 97.1 F (36.2 C) (Temporal)   Resp 20   Ht 5\' 1"  (1.549 m)   Wt 170 lb (77.1 kg)   SpO2 99%   BMI 32.12 kg/m         Assessment & Plan:   [name] comes in today with chief complaint of Medical Management of Chronic Issues   Diagnosis and orders addressed:  1. Primary hypertension Low sodium diet - CBC with Differential/Platelet - CMP14+EGFR  2. Hyperlipidemia LDL goal <70 Low fat diet - Lipid panel - atorvastatin (LIPITOR) 40 MG tablet; Take 1 tablet (40 mg total) by mouth daily.  Dispense: 90 tablet; Refill: 1  3. Peripheral polyneuropathy Do not go barefooted  4. Peripheral edema Elevate legs when sitting  5. Pseudoarthrosis L3- L5 Back stretches Pain meds filled - meloxicam (MOBIC) 7.5 MG tablet; Take 1 tablet (7.5 mg total) by mouth daily.  Dispense: 90 tablet; Refill: 0  6. Obesity (BMI 30-39.9) Discussed diet and exercise for person with BMI >25 Will recheck weight in 3-6 months    Labs pending Health Maintenance reviewed Diet and exercise encouraged  Follow up plan: 3 months pain management   Mary-Margaret Daphine Deutscher, FNP

## 2023-03-13 ENCOUNTER — Ambulatory Visit: Payer: Medicare HMO | Admitting: Nurse Practitioner

## 2023-04-24 DIAGNOSIS — M7062 Trochanteric bursitis, left hip: Secondary | ICD-10-CM | POA: Diagnosis not present

## 2023-04-24 DIAGNOSIS — Z96643 Presence of artificial hip joint, bilateral: Secondary | ICD-10-CM | POA: Diagnosis not present

## 2023-04-24 DIAGNOSIS — M7061 Trochanteric bursitis, right hip: Secondary | ICD-10-CM | POA: Diagnosis not present

## 2023-05-02 DIAGNOSIS — N3000 Acute cystitis without hematuria: Secondary | ICD-10-CM | POA: Diagnosis not present

## 2023-05-12 DIAGNOSIS — N3000 Acute cystitis without hematuria: Secondary | ICD-10-CM | POA: Diagnosis not present

## 2023-05-25 DIAGNOSIS — M7062 Trochanteric bursitis, left hip: Secondary | ICD-10-CM | POA: Diagnosis not present

## 2023-05-25 DIAGNOSIS — M7061 Trochanteric bursitis, right hip: Secondary | ICD-10-CM | POA: Diagnosis not present

## 2023-05-30 DIAGNOSIS — N3020 Other chronic cystitis without hematuria: Secondary | ICD-10-CM | POA: Diagnosis not present

## 2023-05-30 DIAGNOSIS — N135 Crossing vessel and stricture of ureter without hydronephrosis: Secondary | ICD-10-CM | POA: Diagnosis not present

## 2023-05-30 DIAGNOSIS — N3000 Acute cystitis without hematuria: Secondary | ICD-10-CM | POA: Diagnosis not present

## 2023-06-05 ENCOUNTER — Other Ambulatory Visit: Payer: Self-pay | Admitting: Nurse Practitioner

## 2023-06-05 DIAGNOSIS — R600 Localized edema: Secondary | ICD-10-CM

## 2023-06-08 ENCOUNTER — Ambulatory Visit (INDEPENDENT_AMBULATORY_CARE_PROVIDER_SITE_OTHER): Payer: Medicare HMO | Admitting: Nurse Practitioner

## 2023-06-08 ENCOUNTER — Encounter: Payer: Self-pay | Admitting: Nurse Practitioner

## 2023-06-08 DIAGNOSIS — S32009K Unspecified fracture of unspecified lumbar vertebra, subsequent encounter for fracture with nonunion: Secondary | ICD-10-CM | POA: Diagnosis not present

## 2023-06-08 MED ORDER — OXYCODONE-ACETAMINOPHEN 7.5-325 MG PO TABS: 1.0000 | ORAL_TABLET | Freq: Three times a day (TID) | ORAL | 0 refills | Status: AC | PRN

## 2023-06-08 MED ORDER — OXYCODONE-ACETAMINOPHEN 7.5-325 MG PO TABS: 1.0000 | ORAL_TABLET | Freq: Three times a day (TID) | ORAL | 0 refills | Status: DC | PRN

## 2023-06-08 NOTE — Patient Instructions (Signed)
Back Exercises These exercises help to make your trunk and back strong. They also help to keep the lower back flexible. Doing these exercises can help to prevent or lessen pain in your lower back. If you have back pain, try to do these exercises 2-3 times each day or as told by your doctor. As you get better, do the exercises once each day. Repeat the exercises more often as told by your doctor. To stop back pain from coming back, do the exercises once each day, or as told by your doctor. Do exercises exactly as told by your doctor. Stop right away if you feel sudden pain or your pain gets worse. Exercises Single knee to chest Do these steps 3-5 times in a row for each leg: Lie on your back on a firm bed or the floor with your legs stretched out. Bring one knee to your chest. Grab your knee or thigh with both hands and hold it in place. Pull on your knee until you feel a gentle stretch in your lower back or butt. Keep doing the stretch for 10-30 seconds. Slowly let go of your leg and straighten it. Pelvic tilt Do these steps 5-10 times in a row: Lie on your back on a firm bed or the floor with your legs stretched out. Bend your knees so they point up to the ceiling. Your feet should be flat on the floor. Tighten your lower belly (abdomen) muscles to press your lower back against the floor. This will make your tailbone point up to the ceiling instead of pointing down to your feet or the floor. Stay in this position for 5-10 seconds while you gently tighten your muscles and breathe evenly. Cat-cow Do these steps until your lower back bends more easily: Get on your hands and knees on a firm bed or the floor. Keep your hands under your shoulders, and keep your knees under your hips. You may put padding under your knees. Let your head hang down toward your chest. Tighten (contract) the muscles in your belly. Point your tailbone toward the floor so your lower back becomes rounded like the back of a  cat. Stay in this position for 5 seconds. Slowly lift your head. Let the muscles of your belly relax. Point your tailbone up toward the ceiling so your back forms a sagging arch like the back of a cow. Stay in this position for 5 seconds.  Press-ups Do these steps 5-10 times in a row: Lie on your belly (face-down) on a firm bed or the floor. Place your hands near your head, about shoulder-width apart. While you keep your back relaxed and keep your hips on the floor, slowly straighten your arms to raise the top half of your body and lift your shoulders. Do not use your back muscles. You may change where you place your hands to make yourself more comfortable. Stay in this position for 5 seconds. Keep your back relaxed. Slowly return to lying flat on the floor.  Bridges Do these steps 10 times in a row: Lie on your back on a firm bed or the floor. Bend your knees so they point up to the ceiling. Your feet should be flat on the floor. Your arms should be flat at your sides, next to your body. Tighten your butt muscles and lift your butt off the floor until your waist is almost as high as your knees. If you do not feel the muscles working in your butt and the back of  your thighs, slide your feet 1-2 inches (2.5-5 cm) farther away from your butt. Stay in this position for 3-5 seconds. Slowly lower your butt to the floor, and let your butt muscles relax. If this exercise is too easy, try doing it with your arms crossed over your chest. Belly crunches Do these steps 5-10 times in a row: Lie on your back on a firm bed or the floor with your legs stretched out. Bend your knees so they point up to the ceiling. Your feet should be flat on the floor. Cross your arms over your chest. Tip your chin a little bit toward your chest, but do not bend your neck. Tighten your belly muscles and slowly raise your chest just enough to lift your shoulder blades a tiny bit off the floor. Avoid raising your body  higher than that because it can put too much stress on your lower back. Slowly lower your chest and your head to the floor. Back lifts Do these steps 5-10 times in a row: Lie on your belly (face-down) with your arms at your sides, and rest your forehead on the floor. Tighten the muscles in your legs and your butt. Slowly lift your chest off the floor while you keep your hips on the floor. Keep the back of your head in line with the curve in your back. Look at the floor while you do this. Stay in this position for 3-5 seconds. Slowly lower your chest and your face to the floor. Contact a doctor if: Your back pain gets a lot worse when you do an exercise. Your back pain does not get better within 2 hours after you exercise. If you have any of these problems, stop doing the exercises. Do not do them again unless your doctor says it is okay. Get help right away if: You have sudden, very bad back pain. If this happens, stop doing the exercises. Do not do them again unless your doctor says it is okay. This information is not intended to replace advice given to you by your health care provider. Make sure you discuss any questions you have with your health care provider. Document Revised: 07/08/2020 Document Reviewed: 07/08/2020 Elsevier Patient Education  2024 [employer].

## 2023-06-08 NOTE — Progress Notes (Signed)
Subjective:    Patient ID: [name], female    DOB: March 09, 1946, 78 y.o.   MRN: [id number]   Chief Complaint: Pain Management   HPI  Patient come sin today for follow up of chronic low back pain. She has had for several years.  Pain assessment: Cause of pain- DDD Pain location- low back pain Pain on scale of 1-10- 3/10 currently Frequency- daily What increases pain-activity What makes pain Better-rest helsp Effects on ADL - none Any change in general medical condition-none  Current opioids rx- oxycodone 7.5/325 # meds rx- 90 Effectiveness of current meds-helps Adverse reactions from pain meds-none Morphine equivalent- 33.75  Pill count performed-No Last drug screen - 03/09/21 ( high risk q81m, moderate risk q86m, low risk yearly ) Urine drug screen today- No Was the NCCSR reviewed- yes  If yes were their any concerning findings? - no   Overdose risk: 1    09/15/2020   10:41 AM  Opioid Risk   Alcohol 0  Illegal Drugs 0  Rx Drugs 0  Alcohol 0  Illegal Drugs 0  Rx Drugs 0  Age between 16-45 years  0  History of Preadolescent Sexual Abuse 0  Psychological Disease 0  Depression 0  Opioid Risk Tool Scoring 0  Opioid Risk Interpretation Low Risk      Review of Systems  Cardiovascular:  Positive for chest pain. Negative for palpitations and leg swelling.  Musculoskeletal:  Positive for back pain.       Objective:   Physical Exam Constitutional:      Appearance: Normal appearance. She is obese.  Cardiovascular:     Rate and Rhythm: Normal rate and regular rhythm.     Heart sounds: Normal heart sounds.  Pulmonary:     Effort: Pulmonary effort is normal.     Breath sounds: Normal breath sounds.  Musculoskeletal:     Comments: Gailt slow and steady  (-) SLR bil  Skin:    General: Skin is warm.  Neurological:     General: No focal deficit present.     Mental Status: She is alert and oriented to person, place, and time.  Psychiatric:         Mood and Affect: Mood normal.        Behavior: Behavior normal.    BP 136/73   Pulse 69   Temp 98.4 F (36.9 C) (Temporal)   Ht 5\' 1"  (1.549 m)   Wt 168 lb (76.2 kg)   SpO2 99%   BMI 31.74 kg/m          Assessment & Plan:  [name] in today with chief complaint of Pain Management   1. Pseudoarthrosis L3- L5 Moist heat Back stretches - ToxASSURE Select 13 (MW), Urine - oxyCODONE-acetaminophen (PERCOCET) 7.5-325 MG tablet; Take 1 tablet by mouth every 8 (eight) hours as needed for severe pain (pain score 7-10).  Dispense: 90 tablet; Refill: 0 - oxyCODONE-acetaminophen (PERCOCET) 7.5-325 MG tablet; Take 1 tablet by mouth every 8 (eight) hours as needed for severe pain (pain score 7-10).  Dispense: 90 tablet; Refill: 0 - oxyCODONE-acetaminophen (PERCOCET) 7.5-325 MG tablet; Take 1 tablet by mouth every 8 (eight) hours as needed for severe pain (pain score 7-10).  Dispense: 90 tablet; Refill: 0    The above assessment and management plan was discussed with the patient. The patient verbalized understanding of and has agreed to the management plan. Patient is aware to call the clinic if symptoms persist or worsen. Patient  is aware when to return to the clinic for a follow-up visit. Patient educated on when it is appropriate to go to the emergency department.   Mary-Margaret Daphine Deutscher, FNP

## 2023-06-12 ENCOUNTER — Other Ambulatory Visit: Payer: Self-pay | Admitting: Family Medicine

## 2023-06-12 DIAGNOSIS — S32009K Unspecified fracture of unspecified lumbar vertebra, subsequent encounter for fracture with nonunion: Secondary | ICD-10-CM

## 2023-06-12 LAB — TOXASSURE SELECT 13 (MW), URINE

## 2023-06-13 ENCOUNTER — Other Ambulatory Visit: Payer: Self-pay | Admitting: *Deleted

## 2023-06-13 ENCOUNTER — Telehealth (HOSPITAL_COMMUNITY): Payer: Self-pay

## 2023-06-13 MED ORDER — METHOCARBAMOL 500 MG PO TABS: 500.0000 mg | ORAL_TABLET | Freq: Every day | ORAL | 0 refills | Status: DC

## 2023-06-13 MED ORDER — NYSTATIN 100000 UNIT/GM EX CREA: 1.0000 | TOPICAL_CREAM | CUTANEOUS | 0 refills | Status: DC | PRN

## 2023-06-13 NOTE — Telephone Encounter (Signed)
 Pharmacy Patient Advocate Encounter   Received notification from CoverMyMeds that prior authorization for Methocarbamol  500 mg tablets is required/requested.   Insurance verification completed.   The patient is insured through [employer] .   Per test claim: PA required; PA started via CoverMyMeds. KEY [id number] . Waiting for clinical questions to populate.

## 2023-06-14 DIAGNOSIS — R8271 Bacteriuria: Secondary | ICD-10-CM | POA: Diagnosis not present

## 2023-06-14 DIAGNOSIS — N3020 Other chronic cystitis without hematuria: Secondary | ICD-10-CM | POA: Diagnosis not present

## 2023-06-15 ENCOUNTER — Other Ambulatory Visit: Payer: Self-pay

## 2023-06-15 ENCOUNTER — Telehealth: Payer: Self-pay

## 2023-06-15 ENCOUNTER — Ambulatory Visit: Payer: Medicare HMO | Admitting: Internal Medicine

## 2023-06-15 ENCOUNTER — Encounter: Payer: Self-pay | Admitting: Internal Medicine

## 2023-06-15 VITALS — BP 155/71 | HR 56 | Resp 16 | Ht 61.0 in | Wt 167.0 lb

## 2023-06-15 DIAGNOSIS — R399 Unspecified symptoms and signs involving the genitourinary system: Secondary | ICD-10-CM | POA: Diagnosis not present

## 2023-06-15 DIAGNOSIS — N390 Urinary tract infection, site not specified: Secondary | ICD-10-CM | POA: Diagnosis not present

## 2023-06-15 NOTE — Patient Instructions (Signed)
 Will get labs today including urine/blood   Picc line arrangement and you'll be notified   Plan for abx: Ertapenem 1 gram iv daily for days     If your symptoms don't resolve with ertapenem then this is not uti.     Please see me in 4 weeks

## 2023-06-15 NOTE — Progress Notes (Signed)
 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 female with obesity, hx endometrial cancer s/p TAH BSO 2013, hx ureteral fibrosis left side, stress incontinence s/p urethral sling 2005, referred by urology for uti  I reviewed urology's note from 06/14/23 Late December with uti -- no fever, hematuria, n/v, but presence of luts; ucx esbl kleb pna (I - Nitrofurantoin , augmentin , levo (mic 1)). Given levofloxacin  no improvement in sx 05/30/23 repeat visit ua today with microscopic abnormalities still and sx not improved with levo. Given rx for fosfomycin 06/14/23 follow up. Didn't pick up fosfomycin. Sx continue with frequency, urgency, suprapubic pressure, dysuria. No fever, hematuria   Chart list allergy sulfa drugs  GU pMH from chart sent: Ureteral obstruction/stricture since 2014 -- left hydronephrosis and ureteral obstruction secondary to ureteral stricture. Initial left retroperitoneal/intraabd abscess from 2009 lumbar spine decompression/arthrodesis procedure Noted to have declining left kidney function 07/2013 (25%). Patient declined surgical  intervention Pyelonephritis 2014, 2022   It appears last ureteral stent placement/removal was 03/2020    Urine cultures per outside record indicate ecoli in the beginning (R quinolon, bactrim in 2019), then kleb pna 04/2023 as above mentioned   06/14/23 [facility] labs: Ua >60 wbc; 3-10 rbc; moderate bacteria 05/14/23 urine cx mixed growth 06/02/23 ucx kleb pna esbl (I amox clav, tobramycin; R cipro /levo, bactrim, tetracycline, nitrofurantoin , gentamicin ; S avycaz, vabormere) 01/2023 ucx citrobacter murliniae (S amikacin, tobra, piptazo, ertapenem, gent; R bactrim; cipro marylu; ceftaz, ceftriaxone ; I cefepime)     Per patient's hx I have always have had some symptoms for years Since 03/2023 burning, lower abd pain, urgency, frequency. No hematuria. Urine does look dark at time.  No malaise No n/v No flank pain  As mentioned above abx didn't help Nitrofurantoin  04/2023 Levo 05/2023 Fosfomycin too expensive havent taken it Not in trimethoprim  as chart mentioned (stopped years ago) -- rx dispense showed this was given 02/2023 though Given amox-clav but hasn't picked up  Meds: Trimethoprim  100 mg tablet po at bedtime Fosfomycin   Review of Systems: ROS All other ros negative      Past Medical History:  Diagnosis Date   Arthritis    Cancer (HCC)    ovarian,skin Ca. in mouth   Cataract    Bil cataracts removed   Chronic constipation    Chronic kidney disease    Chronic midline low back pain with sciatica    Colon polyp    large ascending colon polyp , scheduled for resection 02/ 2021   Full dentures    History of endometrial cancer 2012   s/p   TAH w/ BSO 11-02-2010   History of recurrent UTIs  History of sepsis    multiple times (some due to e.coli) last sepsis 07/ 2019   History of small bowel obstruction    x2  ,  hx bowel resection   HOH (hard of hearing)    Hyperlipidemia    Hypertension    Retroperitoneal fibrosis    Substance abuse (HCC)     opiod dependancy   Ureteral obstruction, left urologist-- dr renda   secondary to adhesions/ retroperitoneal fibrosis, treated with ureteral stent   Vertigo    Wears glasses     Social History   Tobacco Use   Smoking status: Never   Smokeless tobacco: Never  Vaping Use   Vaping status: Never Used  Substance Use Topics   Alcohol use: No   Drug use: No    Family History  Problem Relation Age of Onset   Diabetes Mother    Lung cancer Mother    Diabetes Father    Liver cancer Father    Colon cancer Sister        close to 43 per pt   Diabetes Sister    Diabetes Sister    Hypertension Sister    Kidney disease Sister    Seizures Daughter    Diabetes Son    Breast cancer Neg Hx    Allergic rhinitis Neg Hx    Angioedema Neg Hx    Asthma Neg Hx    Atopy Neg Hx    Eczema Neg Hx    Immunodeficiency Neg Hx    Urticaria Neg Hx    Colon polyps Neg Hx    Esophageal cancer Neg Hx    Rectal cancer Neg Hx    Stomach cancer Neg Hx     Allergies  Allergen Reactions   Cashew Nut Oil Hives   Dog Epithelium Hives and Itching   Sulfa Antibiotics Other (See Comments)    blistering lips and mouth sores    OBJECTIVE: There were no vitals filed for this visit. There is no height or weight on file to calculate BMI.   Physical Exam General/constitutional: no distress, pleasant HEENT: Normocephalic, PER, Conj Clear, EOMI, Oropharynx clear Neck supple CV: rrr no mrg Lungs: clear to auscultation, normal respiratory effort Abd: Soft, Nontender Ext: no edema Skin: No Rash Neuro: nonfocal MSK: no peripheral joint swelling/tenderness/warmth; back spines nontender   Lab: Last metabolic panel Lab Results  Component Value Date   GLUCOSE 87 03/07/2023   NA 140 03/07/2023   K 4.3 03/07/2023   CL 103 03/07/2023   CO2 24 03/07/2023   BUN 12 03/07/2023   CREATININE 0.84 03/07/2023   EGFR 71 03/07/2023   CALCIUM  9.4 03/07/2023   PHOS 3.4 07/23/2021   PROT 6.6 03/07/2023    ALBUMIN  4.4 03/07/2023   LABGLOB 2.2 03/07/2023   AGRATIO 1.7 06/13/2022   BILITOT 0.6 03/07/2023   ALKPHOS 60 03/07/2023   AST 24 03/07/2023   ALT 20 03/07/2023   ANIONGAP 9 07/23/2021   Lab Results  Component Value Date   WBC 6.7 03/07/2023   HGB 13.0 03/07/2023   HCT 40.2 03/07/2023   MCV 100 (H) 03/07/2023   PLT 299 03/07/2023    Microbiology:  Serology:  Imaging:   Assessment/plan: Problem List Items Addressed This Visit   None     Describe to her that I have low suspicion her chronic sx of the last 2 months is uti  She has clearly LUTS with bacteriuria  She has multiple resistant organisms over the years due  to abx exposure   Now I will give her benefit of the doubt for true uti and suppressed in setting of the 2 different abx courses that don't fully work   Will arrange 7 days ertapenem, if this don't resolve her sx, then it it unequivocal that uti is not in play and she'll need LUTs managed by urology   Ir picc pacement Ertepanem 7 days planned   OPAT Orders Aim for Vancomycin  trough 15-20 or AUC 400-550 (unless otherwise indicated) Duration: 1 week of ertapenem 1000 mg iv daily  End Date: To be determined when picc and first dose started  [facility] Care Per Protocol:  Home health RN for IV administration and teaching; PICC line care and labs.    Labs to be done in clinic on follow up   _x_ Please pull PIC at completion of IV antibiotics __ Please leave PIC in place until doctor has seen patient or been notified  Fax weekly labs to [phone]  Clinic Follow Up Appt: 3-4 weeks  @  [facility] [address], [place], [place] [address] Phone: [phone]       Follow-up: No follow-ups on file.  Constance ONEIDA Passer, MD [facility] 06/15/2023, 10:25 AM

## 2023-06-15 NOTE — Telephone Encounter (Signed)
 Scheduled 06/22/23 at [facility] for Midline Placement to receive Ertapenem x 7 days. Notified Scientist, research (medical).  Ordering Provider: Dr. Shereen Dike

## 2023-06-15 NOTE — Progress Notes (Signed)
   Established Patient Office Visit  Subjective   Patient ID: [name], female    DOB: [birth year]  Age: 78 y.o. MRN: [id number]  Chief Complaint  Patient presents with   Establish Care    HPI    ROS    Objective:     BP (!) 155/71   Pulse (!) 56   Resp 16   Ht 5' 1 (1.549 m)   Wt 167 lb (75.8 kg)   BMI 31.55 kg/m    Physical Exam   No results found for any visits on 06/15/23.    The 10-year ASCVD risk score (Arnett DK, et al., 2019) is: 40.9%    Assessment & Plan:   Problem List Items Addressed This Visit   None Visit Diagnoses       Recurrent UTI    -  Primary   Relevant Orders   Urine Culture   Urine Microscopic Only   CBC   COMPLETE METABOLIC PANEL WITH GFR   IR PICC PLACEMENT RIGHT >5 YRS INC IMG GUIDE     Lower urinary tract symptoms (LUTS)       Relevant Orders   Urine Culture   Urine Microscopic Only   CBC   COMPLETE METABOLIC PANEL WITH GFR   IR PICC PLACEMENT RIGHT >5 YRS INC IMG GUIDE       Return in about 4 weeks (around 07/13/2023).   Patient to have Midline placed on 06/22/23 at 2 pm [facility].   Overton Faith MD

## 2023-06-16 ENCOUNTER — Other Ambulatory Visit (HOSPITAL_COMMUNITY): Payer: Self-pay

## 2023-06-18 LAB — COMPLETE METABOLIC PANEL WITH GFR
AG Ratio: 1.8 (calc) (ref 1.0–2.5)
ALT: 19 U/L (ref 6–29)
AST: 20 U/L (ref 10–35)
Albumin: 4.1 g/dL (ref 3.6–5.1)
Alkaline phosphatase (APISO): 50 U/L (ref 37–153)
BUN: 16 mg/dL (ref 7–25)
CO2: 26 mmol/L (ref 20–32)
Calcium: 9.4 mg/dL (ref 8.6–10.4)
Chloride: 105 mmol/L (ref 98–110)
Creat: 0.89 mg/dL (ref 0.60–1.00)
Globulin: 2.3 g/dL (ref 1.9–3.7)
Glucose, Bld: 82 mg/dL (ref 65–99)
Potassium: 4.8 mmol/L (ref 3.5–5.3)
Sodium: 141 mmol/L (ref 135–146)
Total Bilirubin: 0.7 mg/dL (ref 0.2–1.2)
Total Protein: 6.4 g/dL (ref 6.1–8.1)
eGFR: 66 mL/min/{1.73_m2} (ref >=60)

## 2023-06-18 LAB — CBC
HCT: 38.6 % (ref 35.0–45.0)
Hemoglobin: 13.1 g/dL (ref 11.7–15.5)
MCH: 33.7 pg — ABNORMAL HIGH (ref 27.0–33.0)
MCHC: 33.9 g/dL (ref 32.0–36.0)
MCV: 99.2 fL (ref 80.0–100.0)
MPV: 10.7 fL (ref 7.5–12.5)
Platelets: 265 10*3/uL (ref 140–400)
RBC: 3.89 10*6/uL (ref 3.80–5.10)
RDW: 11.6 % (ref 11.0–15.0)
WBC: 8 10*3/uL (ref 3.8–10.8)

## 2023-06-18 LAB — URINALYSIS, MICROSCOPIC ONLY
Squamous Epithelial / HPF: NONE SEEN /HPF (ref <=5)
WBC, UA: >=60 /[HPF] — AB (ref 0–5)

## 2023-06-18 LAB — URINE CULTURE
MICRO NUMBER:: 16051791
SPECIMEN QUALITY:: ADEQUATE

## 2023-06-22 ENCOUNTER — Ambulatory Visit (HOSPITAL_COMMUNITY)
Admission: RE | Admit: 2023-06-22 | Discharge: 2023-06-22 | Disposition: A | Discharge status: Home / Self Care | Payer: Medicare HMO | Source: Ambulatory Visit | Attending: Internal Medicine | Admitting: Internal Medicine

## 2023-06-22 DIAGNOSIS — R399 Unspecified symptoms and signs involving the genitourinary system: Secondary | ICD-10-CM

## 2023-06-22 DIAGNOSIS — N390 Urinary tract infection, site not specified: Secondary | ICD-10-CM | POA: Insufficient documentation

## 2023-06-22 DIAGNOSIS — Z452 Encounter for adjustment and management of vascular access device: Secondary | ICD-10-CM | POA: Diagnosis not present

## 2023-06-22 MED ORDER — LIDOCAINE HCL 1 % IJ SOLN
INTRAMUSCULAR | Status: AC
  Filled 2023-06-22: qty 20

## 2023-06-22 MED ORDER — HEPARIN SOD (PORK) LOCK FLUSH 100 UNIT/ML IV SOLN
INTRAVENOUS | Status: AC
  Filled 2023-06-22: qty 5

## 2023-06-22 NOTE — Procedures (Signed)
PROCEDURE SUMMARY:  Successful placement of image-guided single lumen PICC line to the right brachial vein. Length 36 cm. Tip at lower SVC/RA. No complications. EBL = <2 ml. Ready for use.  Please see imaging section of Epic for full dictation.   Mickie Kay, NP 06/22/2023 3:39 PM

## 2023-06-23 DIAGNOSIS — N390 Urinary tract infection, site not specified: Secondary | ICD-10-CM | POA: Diagnosis not present

## 2023-06-24 DIAGNOSIS — N390 Urinary tract infection, site not specified: Secondary | ICD-10-CM | POA: Diagnosis not present

## 2023-06-28 ENCOUNTER — Ambulatory Visit: Payer: Medicare HMO

## 2023-06-28 VITALS — Ht 61.0 in | Wt 167.0 lb

## 2023-06-28 DIAGNOSIS — Z0000 Encounter for general adult medical examination without abnormal findings: Secondary | ICD-10-CM

## 2023-06-28 NOTE — Progress Notes (Signed)
 Subjective:   [name] is a 78 y.o. female who presents for Medicare Annual (Subsequent) preventive examination.  Visit Complete: Virtual I connected with  [name] on 06/28/23 by a audio enabled telemedicine application and verified that I am speaking with the correct person using two identifiers.  Patient Location: Home  Provider Location: Home Office  This patient declined Interactive audio and video telecommunications. Therefore the visit was completed with audio only.  I discussed the limitations of evaluation and management by telemedicine. The patient expressed understanding and agreed to proceed.  Vital Signs: Because this visit was a virtual/telehealth visit, some criteria may be missing or patient reported. Any vitals not documented were not able to be obtained and vitals that have been documented are patient reported.  Cardiac Risk Factors include: advanced age (>12men, >95 women);dyslipidemia;sedentary lifestyle;hypertension     Objective:    Today's Vitals   06/28/23 1600  Weight: 167 lb (75.8 kg)  Height: 5\' 1"  (1.549 m)   Body mass index is 31.55 kg/m.     06/28/2023    4:07 PM 06/22/2022    9:20 AM 07/22/2021    7:43 PM 07/22/2021    3:18 PM 06/21/2021    9:00 AM 06/18/2020    8:59 AM 03/19/2020    9:11 AM  Advanced Directives  Does Patient Have a Medical Advance Directive? No Yes No No Yes No Yes  Type of Furniture conservator/restorer;Living will   Healthcare Power of [place];Living will  Living will  Copy of Healthcare Power of Attorney in Chart?  No - copy requested   No - copy requested    Would patient like information on creating a medical advance directive? Yes (MAU/Ambulatory/Procedural Areas - Information given)  No - Patient declined   No - Patient declined     Current Medications (verified) Outpatient Encounter Medications as of 06/28/2023  Medication Sig   ertapenem (INVANZ) 1 g injection    atorvastatin  (LIPITOR) 40 MG tablet Take 1 tablet (40 mg total) by mouth daily.   furosemide (LASIX) 20 MG tablet Take 1 tablet by mouth once daily   lisinopril (ZESTRIL) 20 MG tablet Take 1 tablet (20 mg total) by mouth daily.   meloxicam (MOBIC) 7.5 MG tablet Take 1 tablet (7.5 mg total) by mouth daily.   methocarbamol (ROBAXIN) 500 MG tablet Take 1 tablet (500 mg total) by mouth at bedtime.   mupirocin ointment (BACTROBAN) 2 % Apply 1 Application topically as needed (for iritation).   nystatin cream (MYCOSTATIN) Apply 1 Application topically as needed for dry skin.   [START ON 08/10/2023] oxyCODONE-acetaminophen (PERCOCET) 7.5-325 MG tablet Take 1 tablet by mouth every 8 (eight) hours as needed for severe pain (pain score 7-10).   [START ON 07/11/2023] oxyCODONE-acetaminophen (PERCOCET) 7.5-325 MG tablet Take 1 tablet by mouth every 8 (eight) hours as needed for severe pain (pain score 7-10).   oxyCODONE-acetaminophen (PERCOCET) 7.5-325 MG tablet Take 1 tablet by mouth every 8 (eight) hours as needed for severe pain (pain score 7-10).   pantoprazole (PROTONIX) 40 MG tablet TAKE 1 TABLET BY MOUTH ONCE DAILY BEFORE SUPPER FOR STOMACH PAIN   [DISCONTINUED] amoxicillin-clavulanate (AUGMENTIN) 875-125 MG tablet Take 1 tablet by mouth 2 (two) times daily.   No facility-administered encounter medications on file as of 06/28/2023.    Allergies (verified) Cashew nut oil, Dog epithelium, and Sulfa antibiotics   History: Past Medical History:  Diagnosis Date   Arthritis  Cancer (HCC)    ovarian,skin Ca. in mouth   Cataract    Bil cataracts removed   Chronic constipation    Chronic kidney disease    Chronic midline low back pain with sciatica    Colon polyp    large ascending colon polyp , scheduled for resection 02/ 2021   Full dentures    History of endometrial cancer 2012   s/p   TAH w/ BSO 11-02-2010   History of recurrent UTIs    History of sepsis    multiple times (some due to e.coli) last sepsis  07/ 2019   History of small bowel obstruction    x2  ,  hx bowel resection   HOH (hard of hearing)    Hyperlipidemia    Hypertension    Retroperitoneal fibrosis    Substance abuse (HCC)    opiod dependancy   Ureteral obstruction, left urologist-- dr Laverle Patter   secondary to adhesions/ retroperitoneal fibrosis, treated with ureteral stent   Vertigo    Wears glasses    Past Surgical History:  Procedure Laterality Date   ABDOMINAL HYSTERECTOMY  11/02/10  dr Duard Brady  @[facility]    TAH, BSO, incisional hernia repair , lysis of adhesions for endometrial cancer   ANTERIOR LUMBAR FUSION  01-28-2008   dr elsner  @[facility]    L2 -- L4   BACK SURGERY     BUNIONECTOMY Right 2003   CATARACT EXTRACTION W/ INTRAOCULAR LENS  IMPLANT, BILATERAL  2007   COLONOSCOPY  last one 12-24-2018   CYSTO/  LEFT RETROGRADE PYELOGRAM/ URETEROSCOPY STENT PLACEMENT/  OPEN URETEROLYSIS WITH OMENTAL FLAP Left 03-11-2009    dr borden  @[facility]    CYSTO/  [facility]/  ANTERIOR REPAIR  12-06-2001    dr Annabell Howells @[facility]    CYSTOSCOPY W/ URETERAL STENT PLACEMENT Left 12/07/2017   Procedure: CYSTOSCOPY WITH RETROGRADE PYELOGRAM/URETERAL STENT PLACEMENT;  Surgeon: Heloise Purpura, MD;  Location: [facility];  Service: Urology;  Laterality: Left;   CYSTOSCOPY W/ URETERAL STENT PLACEMENT Left 03/29/2018   Procedure: CYSTOSCOPY WITH STENT  EXCHANGE;  Surgeon: Heloise Purpura, MD;  Location: [facility];  Service: Urology;  Laterality: Left;   CYSTOSCOPY WITH RETROGRADE PYELOGRAM, URETEROSCOPY AND STENT PLACEMENT Left 12-29-2008   dr Laverle Patter  @[facility]    WITH BALLOON DILATION LEFT URETERAL STRICTURE   CYSTOSCOPY WITH RETROGRADE PYELOGRAM, URETEROSCOPY AND STENT PLACEMENT Left 09-12-2008    dr Annabell Howells @[facility]    CYSTOSCOPY WITH RETROGRADE PYELOGRAM, URETEROSCOPY AND STENT PLACEMENT Left 03/23/2020   Procedure: CYSTOSCOPY WITH LEFT RETROGRADE PYELOGRAM, AND LEFT STENT REMOVAL;  Surgeon: Heloise Purpura, MD;  Location: [facility];  Service: Urology;  Laterality: Left;   CYSTOSCOPY WITH  STENT PLACEMENT Left 07/26/2018   Procedure: CYSTOSCOPY WITH STENT CHANGE;  Surgeon: Heloise Purpura, MD;  Location: [facility];  Service: Urology;  Laterality: Left;   CYSTOSCOPY WITH STENT PLACEMENT Left 12/03/2018   Procedure: CYSTOSCOPY WITH STENT CHANGE;  Surgeon: Heloise Purpura, MD;  Location: [facility];  Service: Urology;  Laterality: Left;   CYSTOSCOPY WITH STENT PLACEMENT Left 04/22/2019   Procedure: CYSTOSCOPY WITH STENT CHANGE;  Surgeon: Heloise Purpura, MD;  Location: [facility];  Service: Urology;  Laterality: Left;   CYSTOSCOPY WITH STENT PLACEMENT Left 10/03/2019   Procedure: CYSTOSCOPY WITH STENT CHANGE;  Surgeon: Heloise Purpura, MD;  Location: [facility];  Service: Urology;  Laterality: Left;   EYE SURGERY     FOOT SURGERY Left 06-23-2010   dr Lestine Box   left first and second toes   INSERTION OF  MESH N/A 04/23/2013   Procedure: INSERTION OF MESH;  Surgeon: Ardeth Sportsman, MD;  Location: [facility];  Service: General;  Laterality: N/A;   JOINT REPLACEMENT     KNEE ARTHROSCOPY Right 04-27-2007  dr Simonne Come @[facility]    LAPAROSCOPIC LYSIS OF ADHESIONS N/A 04/23/2013   Procedure: LAPAROSCOPIC LYSIS OF ADHESIONS;  Surgeon: Ardeth Sportsman, MD;  Location: [facility];  Service: General;  Laterality: N/A;   LUMBAR SPINE SURGERY  1989 and 1999   POSTERIOR LUMBAR FUSION  12/17/2012   L3 -- L5 laminectomy and L3 -- 5 fusion   TOTAL HIP ARTHROPLASTY Left 07/04/2014   Procedure: LEFT TOTAL HIP ARTHROPLASTY ANTERIOR APPROACH;  Surgeon: Loanne Drilling, MD;  Location: [facility];  Service: Orthopedics;  Laterality: Left;   TOTAL HIP ARTHROPLASTY Right 2009   TOTAL KNEE ARTHROPLASTY Right 09-15-2009   dr Simonne Come @[facility]    VENTRAL HERNIA REPAIR N/A 07/19/2012   Procedure: LAPAROSCOPIC LYSIS OF ADHESIONS, SMALL BOWEL RESECTION, SEROSAL REPAIR, PRIMARY VENTRAL HERNIA REPAIR;  Surgeon: Ardeth Sportsman, MD;  Location: [facility];  Service: General;  Laterality: N/A;   VENTRAL HERNIA REPAIR N/A 04/23/2013   Procedure:  LAPAROSCOPIC exploration and repair of hernia in abdominal VENTRAL wall  HERNIA;  Surgeon: Ardeth Sportsman, MD;  Location: [facility];  Service: General;  Laterality: N/A;   Family History  Problem Relation Age of Onset   Diabetes Mother    Lung cancer Mother    Diabetes Father    Liver cancer Father    Colon cancer Sister        close to 53 per pt   Diabetes Sister    Diabetes Sister    Hypertension Sister    Kidney disease Sister    Seizures Daughter    Diabetes Son    Breast cancer Neg Hx    Allergic rhinitis Neg Hx    Angioedema Neg Hx    Asthma Neg Hx    Atopy Neg Hx    Eczema Neg Hx    Immunodeficiency Neg Hx    Urticaria Neg Hx    Colon polyps Neg Hx    Esophageal cancer Neg Hx    Rectal cancer Neg Hx    Stomach cancer Neg Hx    Social History   Socioeconomic History   Marital status: Single    Spouse name: Not on file   Number of children: 3   Years of education: 12   Highest education level: High school graduate  Occupational History   Occupation: Retired  Tobacco Use   Smoking status: Never   Smokeless tobacco: Never  Vaping Use   Vaping status: Never Used  Substance and Sexual Activity   Alcohol use: No   Drug use: No   Sexual activity: Not Currently    Birth control/protection: Surgical  Other Topics Concern   Not on file  Social History Narrative   Her son and DIL live with her   They help with bills   Social Drivers of Health   Financial Resource Strain: Low Risk  (06/28/2023)   Overall Financial Resource Strain (CARDIA)    Difficulty of Paying Living Expenses: Not hard at all  Food Insecurity: No Food Insecurity (06/28/2023)   Hunger Vital Sign    Worried About Running Out of Food in the Last Year: Never true    Ran Out of Food in the Last Year: Never true  Transportation Needs: No Transportation Needs (06/28/2023)   PRAPARE - Transportation  Lack of Transportation (Medical): No    Lack of Transportation (Non-Medical): No  Physical  Activity: Insufficiently Active (06/28/2023)   Exercise Vital Sign    Days of Exercise per Week: 3 days    Minutes of Exercise per Session: 30 min  Stress: No Stress Concern Present (06/28/2023)   [employer] of Occupational Health - Occupational Stress Questionnaire    Feeling of Stress : Not at all  Social Connections: Moderately Isolated (06/28/2023)   Social Connection and Isolation Panel [NHANES]    Frequency of Communication with Friends and Family: More than three times a week    Frequency of Social Gatherings with Friends and Family: Three times a week    Attends Religious Services: More than 4 times per year    Active Member of Clubs or Organizations: No    Attends Banker Meetings: Never    Marital Status: Widowed    Tobacco Counseling Counseling given: Not Answered   Clinical Intake:  Pre-visit preparation completed: Yes  Pain : No/denies pain     Diabetes: No  How often do you need to have someone help you when you read instructions, pamphlets, or other written materials from your doctor or pharmacy?: 1 - Never  Interpreter Needed?: No  Information entered by :: Kandis Fantasia LPN   Activities of Daily Living    06/28/2023    4:06 PM  In your present state of health, do you have any difficulty performing the following activities:  Hearing? 0  Vision? 0  Difficulty concentrating or making decisions? 0  Walking or climbing stairs? 0  Dressing or bathing? 0  Doing errands, shopping? 0  Preparing Food and eating ? N  Using the Toilet? N  In the past six months, have you accidently leaked urine? N  Do you have problems with loss of bowel control? N  Managing your Medications? N  Managing your Finances? N  Housekeeping or managing your Housekeeping? N    Patient Care Team: Bennie Pierini, FNP as PCP - General (Family Medicine) Cleda Mccreedy, MD as Consulting Physician (Gynecologic Oncology) Heloise Purpura, MD as Consulting  Physician (Urology) Callie Fielding, MD as Consulting Physician (Pain Medicine) Myrtie Neither Andreas Blower, MD as Consulting Physician (Gastroenterology) Karie Soda, MD as Consulting Physician (General Surgery) Raymondo Band, MD as Consulting Physician (Infectious Diseases) Genia Del Daisy Blossom, MD as Consulting Physician (Ophthalmology)  Indicate any recent Medical Services you may have received from other than [facility] providers in the past year (date may be approximate).     Assessment:   This is a routine wellness examination for Azarya.  Hearing/Vision screen Hearing Screening - Comments:: Some hearing difficulty   Vision Screening - Comments:: Wears rx glasses - up to date with routine eye exams with Dr.Mincey     Goals Addressed             This Visit's Progress    COMPLETED: Patient Stated       06/18/2020 AWV Goal: Exercise for General Health  Patient will verbalize understanding of the benefits of increased physical activity: Exercising regularly is important. It will improve your overall fitness, flexibility, and endurance. Regular exercise also will improve your overall health. It can help you control your weight, reduce stress, and improve your bone density. Over the next year, patient will increase physical activity as tolerated with a goal of at least 150 minutes of moderate physical activity per week.  You can tell that you are exercising at a moderate intensity  if your heart starts beating faster and you start breathing faster but can still hold a conversation. Moderate-intensity exercise ideas include: Walking 1 mile (1.6 km) in about 15 minutes Biking Hiking Golfing Dancing Water aerobics Patient will verbalize understanding of everyday activities that increase physical activity by providing examples like the following: Yard work, such as: Insurance underwriter Gardening Washing  windows or floors Patient will be able to explain general safety guidelines for exercising:  Before you start a new exercise program, talk with your health care provider. Do not exercise so much that you hurt yourself, feel dizzy, or get very short of breath. Wear comfortable clothes and wear shoes with good support. Drink plenty of water while you exercise to prevent dehydration or heat stroke. Work out until your breathing and your heartbeat get faster.      Remain active and independent        Depression Screen    06/28/2023    4:05 PM 03/07/2023    9:55 AM 12/12/2022   11:20 AM 09/09/2022    9:58 AM 07/26/2022   12:29 PM 06/22/2022    9:19 AM 06/13/2022   10:06 AM  PHQ 2/9 Scores  PHQ - 2 Score 0 0 0 0 0 0 0  PHQ- 9 Score  0 3 1 0 0 0    Fall Risk    06/28/2023    4:06 PM 03/07/2023    9:55 AM 12/12/2022   11:20 AM 09/09/2022    9:58 AM 07/26/2022   12:29 PM  Fall Risk   Falls in the past year? 0 0 1 1 0  Number falls in past yr: 0  0 0   Injury with Fall? 0  1 1   Risk for fall due to : No Fall Risks  History of fall(s) History of fall(s)   Follow up Falls prevention discussed;Education provided;Falls evaluation completed  Education provided Education provided     MEDICARE RISK AT HOME: Medicare Risk at Home Any stairs in or around the home?: No If so, are there any without handrails?: No Home free of loose throw rugs in walkways, pet beds, electrical cords, etc?: Yes Adequate lighting in your home to reduce risk of falls?: Yes Life alert?: No Use of a cane, walker or w/c?: Yes Grab bars in the bathroom?: Yes Shower chair or bench in shower?: No Elevated toilet seat or a handicapped toilet?: Yes  TIMED UP AND GO:  Was the test performed?  No    Cognitive Function:    01/11/2018   11:21 AM  MMSE - Mini Mental State Exam  Orientation to time 5  Orientation to Place 5  Registration 3  Attention/ Calculation 5  Recall 3  Language- name 2 objects 2  Language- repeat  1  Language- follow 3 step command 3  Language- read & follow direction 1  Write a sentence 1  Copy design 1  Total score 30        06/28/2023    4:06 PM 06/22/2022    9:21 AM 06/21/2021    9:02 AM 06/18/2020    9:00 AM  6CIT Screen  What Year? 0 points 0 points 0 points 0 points  What month? 0 points 0 points 0 points 0 points  What time? 0 points 0 points 0 points 0 points  Count back from 20 0 points 0 points 0 points 0 points  Months  in reverse 0 points 0 points 2 points 0 points  Repeat phrase 0 points 0 points 2 points 0 points  Total Score 0 points 0 points 4 points 0 points    Immunizations Immunization History  Administered Date(s) Administered   Fluad Quad(high Dose 65+) 03/22/2019, 03/20/2020, 03/09/2021, 03/02/2022   Fluad Trivalent(High Dose 65+) 03/07/2023   Influenza, High Dose Seasonal PF 02/19/2016, 03/20/2017, 03/14/2018   Influenza,inj,Quad PF,6+ Mos 02/21/2013, 03/28/2014, 04/17/2015   Moderna Covid-19 Vaccine Bivalent Booster 4yrs & up 06/01/2021   Moderna SARS-COV2 Booster Vaccination 06/02/2022   Moderna Sars-Covid-2 Vaccination 07/15/2019, 08/12/2019, 03/31/2020, 11/17/2020   Pneumococcal Conjugate-13 09/30/2014   Pneumococcal Polysaccharide-23 05/09/2010   Pneumococcal-Unspecified 05/09/2016   Respiratory Syncytial Virus Vaccine,Recomb Aduvanted(Arexvy) 03/24/2023   Tdap 03/09/2012   Zoster Recombinant(Shingrix) 11/30/2021, 12/12/2022   Zoster, Live 05/09/2010    TDAP status: Due, Education has been provided regarding the importance of this vaccine. Advised may receive this vaccine at local pharmacy or Health Dept. Aware to provide a copy of the vaccination record if obtained from local pharmacy or Health Dept. Verbalized acceptance and understanding.  Flu Vaccine status: Up to date  Pneumococcal vaccine status: Up to date  Covid-19 vaccine status: Information provided on how to obtain vaccines.   Qualifies for Shingles Vaccine? Yes   Zostavax  completed Yes   Shingrix Completed?: Yes  Screening Tests Health Maintenance  Topic Date Due   COVID-19 Vaccine (7 - 2024-25 season) 01/08/2023   DTaP/Tdap/Td (2 - Td or Tdap) 03/06/2024 (Originally 03/09/2022)   Colonoscopy  02/26/2024   Medicare Annual Wellness (AWV)  06/27/2024   DEXA SCAN  09/08/2024   Pneumonia Vaccine 26+ Years old  Completed   INFLUENZA VACCINE  Completed   Hepatitis C Screening  Completed   Zoster Vaccines- Shingrix  Completed   HPV VACCINES  Aged Out    Health Maintenance  Health Maintenance Due  Topic Date Due   COVID-19 Vaccine (7 - 2024-25 season) 01/08/2023    Colorectal cancer screening: Type of screening: Colonoscopy. Completed 02/25/21. Repeat every 3 years  Mammogram status: Completed 07/12/22. Repeat every year  Bone Density status: Completed 09/09/22. Results reflect: Bone density results: OSTEOPENIA. Repeat every 2 years.  Lung Cancer Screening: (Low Dose CT Chest recommended if Age 73-80 years, 20 pack-year currently smoking OR have quit w/in 15years.) does not qualify.   Lung Cancer Screening Referral: n/a  Additional Screening:  Hepatitis C Screening: does qualify; Completed 04/17/15  Vision Screening: Recommended annual ophthalmology exams for early detection of glaucoma and other disorders of the eye. Is the patient up to date with their annual eye exam?  Yes  Who is the provider or what is the name of the office in which the patient attends annual eye exams? Dr. Genia Del If pt is not established with a provider, would they like to be referred to a provider to establish care? No .   Dental Screening: Recommended annual dental exams for proper oral hygiene  Community Resource Referral / Chronic Care Management: CRR required this visit?  No   CCM required this visit?  No     Plan:     I have personally reviewed and noted the following in the patient's chart:   Medical and social history Use of alcohol, tobacco or illicit drugs   Current medications and supplements including opioid prescriptions. Patient is currently taking opioid prescriptions. Information provided to patient regarding non-opioid alternatives. Patient advised to discuss non-opioid treatment plan with their provider. Functional ability and  status Nutritional status Physical activity Advanced directives List of other physicians Hospitalizations, surgeries, and ER visits in previous 12 months Vitals Screenings to include cognitive, depression, and falls Referrals and appointments  In addition, I have reviewed and discussed with patient certain preventive protocols, quality metrics, and best practice recommendations. A written personalized care plan for preventive services as well as general preventive health recommendations were provided to patient.     Kandis Fantasia [place], [place]   1/61/0960   After Visit Summary: (MyChart) Due to this being a telephonic visit, the after visit summary with patients personalized plan was offered to patient via MyChart   Nurse Notes: No concerns at this time

## 2023-06-28 NOTE — Patient Instructions (Signed)
 Ms. Royle , Thank you for taking time to come for your Medicare Wellness Visit. I appreciate your ongoing commitment to your health goals. Please review the following plan we discussed and let me know if I can assist you in the future.   Referrals/Orders/Follow-Ups/Clinician Recommendations: Aim for 30 minutes of exercise or brisk walking, 6-8 glasses of water, and 5 servings of fruits and vegetables each day.  This is a list of the screening recommended for you and due dates:  Health Maintenance  Topic Date Due   COVID-19 Vaccine (7 - 2024-25 season) 01/08/2023   DTaP/Tdap/Td vaccine (2 - Td or Tdap) 03/06/2024*   Colon Cancer Screening  02/26/2024   Medicare Annual Wellness Visit  06/27/2024   DEXA scan (bone density measurement)  09/08/2024   Pneumonia Vaccine  Completed   Flu Shot  Completed   Hepatitis C Screening  Completed   Zoster (Shingles) Vaccine  Completed   HPV Vaccine  Aged Out  *Topic was postponed. The date shown is not the original due date.    Advanced directives: (ACP Link)Information on Advanced Care Planning can be found at [facility] [place] Advance Health Care Directives Advance Health Care Directives ([email or web address]   Next Medicare Annual Wellness Visit scheduled for next year: Yes

## 2023-06-29 ENCOUNTER — Other Ambulatory Visit (HOSPITAL_COMMUNITY): Payer: Self-pay

## 2023-06-29 ENCOUNTER — Encounter: Payer: Self-pay | Admitting: Pharmacist

## 2023-06-29 ENCOUNTER — Other Ambulatory Visit: Payer: Self-pay | Admitting: Nurse Practitioner

## 2023-06-29 ENCOUNTER — Other Ambulatory Visit: Payer: Self-pay

## 2023-06-29 DIAGNOSIS — S32009K Unspecified fracture of unspecified lumbar vertebra, subsequent encounter for fracture with nonunion: Secondary | ICD-10-CM

## 2023-06-29 MED ORDER — METHOCARBAMOL 500 MG PO TABS
500.0000 mg | ORAL_TABLET | Freq: Every day | ORAL | 0 refills | Status: DC
  Filled 2023-06-29 – 2023-07-13 (×2): qty 15, 15d supply, fill #0

## 2023-06-30 DIAGNOSIS — N390 Urinary tract infection, site not specified: Secondary | ICD-10-CM | POA: Diagnosis not present

## 2023-07-04 ENCOUNTER — Other Ambulatory Visit: Payer: Self-pay

## 2023-07-10 ENCOUNTER — Encounter: Payer: Self-pay | Admitting: Infectious Disease

## 2023-07-10 DIAGNOSIS — R8271 Bacteriuria: Secondary | ICD-10-CM

## 2023-07-10 DIAGNOSIS — R399 Unspecified symptoms and signs involving the genitourinary system: Secondary | ICD-10-CM | POA: Insufficient documentation

## 2023-07-10 HISTORY — DX: Unspecified symptoms and signs involving the genitourinary system: R39.9

## 2023-07-10 HISTORY — DX: Bacteriuria: R82.71

## 2023-07-11 ENCOUNTER — Ambulatory Visit: Payer: Medicare HMO | Admitting: Infectious Disease

## 2023-07-11 ENCOUNTER — Other Ambulatory Visit: Payer: Self-pay

## 2023-07-11 ENCOUNTER — Encounter: Payer: Self-pay | Admitting: Infectious Disease

## 2023-07-11 VITALS — BP 144/85 | HR 70 | Temp 97.6°F | Ht 61.0 in | Wt 168.0 lb

## 2023-07-11 DIAGNOSIS — R399 Unspecified symptoms and signs involving the genitourinary system: Secondary | ICD-10-CM | POA: Diagnosis not present

## 2023-07-11 DIAGNOSIS — N390 Urinary tract infection, site not specified: Secondary | ICD-10-CM

## 2023-07-11 DIAGNOSIS — R8271 Bacteriuria: Secondary | ICD-10-CM | POA: Diagnosis not present

## 2023-07-11 MED ORDER — PREMARIN 0.625 MG/GM VA CREA: TOPICAL_CREAM | VAGINAL | 12 refills | Status: AC

## 2023-07-11 NOTE — Progress Notes (Signed)
 Subjective:  Chief complaint: follow-up for chronic urinary symptoms   Patient ID: [name], female    DOB: [birth year], 78 y.o.   MRN: [id number]  HPI   Discussed the use of AI scribe software for clinical note transcription with the patient, who gave verbal consent to proceed.  History of Present Illness   The patient, with a history of back surgery and "organ displacement" has been experiencing chronic urinary symptoms since 2009. She reports a history of urinary tract infections, with one severe enough to cause bacteremia. She describes her symptoms as intermittent, including difficulty emptying her bladder, burning sensation during urination, and occasional nausea. These symptoms sometimes they have improved with antibiotic treatment but have a tendency to recur. The patient also takes diuretics, which seem to aid in urination. She has been under the care of a urologist and has had her bladder function evaluated, which was reported as normal.   My partner Dr. Renold Don had seen her as a Consult and was skeptical that majority of her symptoms  but decided to give her a 7 day course of ertapenem. Please see his extensive and thorough note re her history and microbiology data.   She states that her symptoms did improve while on IV antibiotics and her home testing of her urine showed no nitrites etc. Symptoms then did recur and her home testing was also again abnormal. She currently does not have any compelling symptoms to suggest and active UTI.       Review of Systems  Constitutional:  Negative for chills and fever.  HENT:  Negative for congestion and sore throat.   Eyes:  Negative for photophobia.  Respiratory:  Negative for cough, shortness of breath and wheezing.   Cardiovascular:  Negative for chest pain, palpitations and leg swelling.  Gastrointestinal:  Negative for abdominal pain, blood in stool, constipation, diarrhea, nausea and vomiting.  Genitourinary:  Negative for  dysuria, flank pain and hematuria.  Musculoskeletal:  Negative for back pain and myalgias.  Skin:  Negative for rash.  Neurological:  Negative for dizziness, weakness and headaches.  Hematological:  Does not bruise/bleed easily.  Psychiatric/Behavioral:  Negative for suicidal ideas.        Objective:   Physical Exam Constitutional:      General: She is not in acute distress.    Appearance: Normal appearance. She is well-developed. She is not ill-appearing or diaphoretic.  HENT:     Head: Normocephalic and atraumatic.     Right Ear: Hearing and external ear normal.     Left Ear: Hearing and external ear normal.     Nose: No nasal deformity or rhinorrhea.  Eyes:     General: No scleral icterus.    Conjunctiva/sclera: Conjunctivae normal.     Right eye: Right conjunctiva is not injected.     Left eye: Left conjunctiva is not injected.     Pupils: Pupils are equal, round, and reactive to light.  Neck:     Vascular: No JVD.  Cardiovascular:     Rate and Rhythm: Normal rate and regular rhythm.     Heart sounds: Normal heart sounds, S1 normal and S2 normal. No murmur heard.    No friction rub.  Abdominal:     General: Bowel sounds are normal. There is no distension.     Palpations: Abdomen is soft.     Tenderness: There is no abdominal tenderness.  Musculoskeletal:        General: Normal range of motion.  Right shoulder: Normal.     Left shoulder: Normal.     Cervical back: Normal range of motion and neck supple.     Right hip: Normal.     Left hip: Normal.     Right knee: Normal.     Left knee: Normal.  Lymphadenopathy:     Head:     Right side of head: No submandibular, preauricular or posterior auricular adenopathy.     Left side of head: No submandibular, preauricular or posterior auricular adenopathy.     Cervical: No cervical adenopathy.     Right cervical: No superficial or deep cervical adenopathy.    Left cervical: No superficial or deep cervical adenopathy.   Skin:    General: Skin is warm and dry.     Coloration: Skin is not pale.     Findings: No abrasion, bruising, ecchymosis, erythema, lesion or rash.     Nails: There is no clubbing.  Neurological:     Mental Status: She is alert and oriented to person, place, and time.     Sensory: No sensory deficit.     Coordination: Coordination normal.     Gait: Gait normal.  Psychiatric:        Attention and Perception: She is attentive.        Mood and Affect: Mood normal.        Speech: Speech normal.        Behavior: Behavior normal. Behavior is cooperative.        Thought Content: Thought content normal.        Judgment: Judgment normal.           Assessment & Plan:   Assessment and Plan    Chronic Urinary Symptoms History of chronic urinary symptoms since 2009, possibly related to anatomical changes post back surgery. Symptoms include difficulty emptying bladder, occasional burning sensation during urination, and intermittent nausea. No current signs of urinary tract infection. -Prescribe Premarin vaginal cream, initial dose nightly for a week, then twice a week. -Schedule follow-up appointment in two months to assess symptom improvement.  Urinary Tract Infections History of recurrent urinary tract infections, including one episode of bacteremia. No current signs of urinary tract infection, and clearly at times she does have actual UTI's though I suspect the vast majority of her symptoms have not been related to actual UTI's -Advise against home urine tests and testing of the urine in general since, pyuria, and bacteruria are in and of themselves do not ever cement a diagnosis of UTI. SYMPTOMS are the key part and we need compelling symptoms to prompt evaluation and treatment -Continue monitoring for symptoms of urinary tract infection, including for  example increased burning with urination, suprapubic pain, flank pain, and fever.  Follow-up in 2 months to assess response to  Premarin and ongoing urinary symptoms.      .I have personally spent 28 minutes involved in face-to-face and non-face-to-face activities for this patient on the day of the visit. Professional time spent includes the following activities: Preparing to see the patient (review of tests), Obtaining and/or reviewing separately obtained history (admission/discharge record), Performing a medically appropriate examination and/or evaluation , Ordering medications/tests/procedures, referring and communicating with other health care professionals, Documenting clinical information in the EMR, Independently interpreting results (not separately reported), Communicating results to the patient/family/caregiver, Counseling and educating the patient/family/caregiver and Care coordination (not separately reported).

## 2023-07-14 ENCOUNTER — Other Ambulatory Visit (HOSPITAL_COMMUNITY): Payer: Self-pay

## 2023-07-14 ENCOUNTER — Other Ambulatory Visit: Payer: Self-pay

## 2023-07-19 ENCOUNTER — Other Ambulatory Visit: Payer: Self-pay

## 2023-07-20 ENCOUNTER — Other Ambulatory Visit: Payer: Self-pay | Admitting: Nurse Practitioner

## 2023-07-20 DIAGNOSIS — I10 Essential (primary) hypertension: Secondary | ICD-10-CM

## 2023-07-27 DIAGNOSIS — M7062 Trochanteric bursitis, left hip: Secondary | ICD-10-CM | POA: Diagnosis not present

## 2023-07-27 DIAGNOSIS — M7061 Trochanteric bursitis, right hip: Secondary | ICD-10-CM | POA: Diagnosis not present

## 2023-09-01 ENCOUNTER — Other Ambulatory Visit: Payer: Self-pay | Admitting: Nurse Practitioner

## 2023-09-01 DIAGNOSIS — S32009K Unspecified fracture of unspecified lumbar vertebra, subsequent encounter for fracture with nonunion: Secondary | ICD-10-CM

## 2023-09-04 ENCOUNTER — Other Ambulatory Visit: Payer: Self-pay | Admitting: Nurse Practitioner

## 2023-09-04 DIAGNOSIS — R600 Localized edema: Secondary | ICD-10-CM

## 2023-09-07 ENCOUNTER — Encounter: Payer: Self-pay | Admitting: Nurse Practitioner

## 2023-09-07 ENCOUNTER — Ambulatory Visit (INDEPENDENT_AMBULATORY_CARE_PROVIDER_SITE_OTHER)

## 2023-09-07 ENCOUNTER — Ambulatory Visit: Payer: Medicare HMO | Admitting: Nurse Practitioner

## 2023-09-07 VITALS — BP 165/72 | HR 75 | Temp 97.4°F | Ht 61.0 in | Wt 174.0 lb

## 2023-09-07 DIAGNOSIS — R600 Localized edema: Secondary | ICD-10-CM | POA: Diagnosis not present

## 2023-09-07 DIAGNOSIS — S32009K Unspecified fracture of unspecified lumbar vertebra, subsequent encounter for fracture with nonunion: Secondary | ICD-10-CM

## 2023-09-07 DIAGNOSIS — I10 Essential (primary) hypertension: Secondary | ICD-10-CM

## 2023-09-07 DIAGNOSIS — M25551 Pain in right hip: Secondary | ICD-10-CM | POA: Diagnosis not present

## 2023-09-07 DIAGNOSIS — Z0001 Encounter for general adult medical examination with abnormal findings: Secondary | ICD-10-CM

## 2023-09-07 DIAGNOSIS — E669 Obesity, unspecified: Secondary | ICD-10-CM | POA: Diagnosis not present

## 2023-09-07 DIAGNOSIS — E785 Hyperlipidemia, unspecified: Secondary | ICD-10-CM

## 2023-09-07 DIAGNOSIS — M25552 Pain in left hip: Secondary | ICD-10-CM | POA: Diagnosis not present

## 2023-09-07 DIAGNOSIS — G629 Polyneuropathy, unspecified: Secondary | ICD-10-CM | POA: Diagnosis not present

## 2023-09-07 DIAGNOSIS — Z6832 Body mass index (BMI) 32.0-32.9, adult: Secondary | ICD-10-CM

## 2023-09-07 DIAGNOSIS — Z0000 Encounter for general adult medical examination without abnormal findings: Secondary | ICD-10-CM

## 2023-09-07 LAB — LIPID PANEL

## 2023-09-07 MED ORDER — OXYCODONE-ACETAMINOPHEN 7.5-325 MG PO TABS: 1.0000 | ORAL_TABLET | Freq: Three times a day (TID) | ORAL | 0 refills | Status: AC | PRN

## 2023-09-07 MED ORDER — LISINOPRIL 20 MG PO TABS: 40.0000 mg | ORAL_TABLET | Freq: Every day | ORAL | 1 refills | Status: DC

## 2023-09-07 MED ORDER — OXYCODONE-ACETAMINOPHEN 7.5-325 MG PO TABS: 1.0000 | ORAL_TABLET | Freq: Three times a day (TID) | ORAL | 0 refills | Status: DC | PRN

## 2023-09-07 MED ORDER — FUROSEMIDE 20 MG PO TABS: 20.0000 mg | ORAL_TABLET | Freq: Every day | ORAL | 0 refills | Status: DC

## 2023-09-07 NOTE — Progress Notes (Signed)
 Subjective:    Patient ID: [name], female    DOB: April 27, 1946, 78 y.o.   MRN: [id number]   Chief Complaint: annual physical    HPI:  [name] is a 78 y.o. who identifies as a female who was assigned female at birth.   Social history: Lives with: by herself Work history: retired   Water engineer in today for follow up of the following chronic medical issues:  1. Primary hypertension No c/o chest pain, sob or headache. Does not check blood pressure at home. BP Readings from Last 3 Encounters:  07/11/23 (!) 144/85  06/15/23 (!) 155/71  06/08/23 136/73     2. Hyperlipidemia LDL goal <70 Does not really watch diet and does no dedicated exercise. Lab Results  Component Value Date   CHOL 146 03/07/2023   HDL 86 03/07/2023   LDLCALC 44 03/07/2023   TRIG 83 03/07/2023   CHOLHDL 1.7 03/07/2023     3. Peripheral polyneuropathy Has numbness in bil feet at times  4. Peripheral edema Has some swelling in ankles by the endof the day. Herlasix works well for her.  5. GERD Takes protonix  daily and is doing well  6. Pseudoarthrosis L3- L5 Pain assessment: Cause of pain- pseudoarthrosis Pain location- lower back Pain on scale of 1-10- 5-6/10 Frequency- daily What increases pain-nothing really What makes pain Better-pain meds help Effects on ADL - none Any change in general medical condition-none  Current opioids rx- percocet 7.5/325-  # meds rx- 90 Effectiveness of current meds-helps when needed Adverse reactions from pain meds-none Morphine  equivalent- 33.75MME  Pill count performed-No Last drug screen - 06/13/22 ( high risk q34m, moderate risk q63m, low risk yearly ) Urine drug screen today- No Was the [facility] reviewed- yes  If yes were their any concerning findings? - no   Overdose risk: 1    09/15/2020   10:41 AM  Opioid Risk   Alcohol 0  Illegal Drugs 0  Rx Drugs 0  Alcohol 0  Illegal Drugs 0  Rx Drugs 0  Age between 16-45 years  0  History  of Preadolescent Sexual Abuse 0  Psychological Disease 0  Depression 0  Opioid Risk Tool Scoring 0  Opioid Risk Interpretation Low Risk     Pain contract signed on:06/15/22   6. Obesity (BMI 30-39.9) Weight is up 6lbs Wt Readings from Last 3 Encounters:  09/07/23 174 lb (78.9 kg)  07/11/23 168 lb (76.2 kg)  06/28/23 167 lb (75.8 kg)   BMI Readings from Last 3 Encounters:  09/07/23 32.88 kg/m  07/11/23 31.74 kg/m  06/28/23 31.55 kg/m       New complaints: None today  Allergies  Allergen Reactions   Cashew Nut Oil Hives   Dog Epithelium Hives and Itching   Sulfa Antibiotics Other (See Comments)    "blistering lips and mouth sores"   Outpatient Encounter Medications as of 09/07/2023  Medication Sig   atorvastatin  (LIPITOR) 40 MG tablet Take 1 tablet (40 mg total) by mouth daily.   conjugated estrogens  (PREMARIN ) vaginal cream Apply 0.5 mg intravaginally once daily x 2 weeks and then twice a week   ertapenem (INVANZ) 1 g injection  (Patient not taking: Reported on 07/11/2023)   furosemide  (LASIX ) 20 MG tablet Take 1 tablet by mouth once daily   lisinopril  (ZESTRIL ) 20 MG tablet Take 1 tablet by mouth once daily   meloxicam  (MOBIC ) 7.5 MG tablet Take 1 tablet by mouth once daily   methocarbamol  (ROBAXIN ) 500  MG tablet Take 1 tablet (500 mg total) by mouth at bedtime. (Patient not taking: Reported on 07/11/2023)   mupirocin  ointment (BACTROBAN ) 2 % Apply 1 Application topically as needed (for iritation). (Patient not taking: Reported on 07/11/2023)   nystatin  cream (MYCOSTATIN ) APPLY TOPICALLY AS NEEDED FOR DRY SKIN   oxyCODONE -acetaminophen  (PERCOCET) 7.5-325 MG tablet Take 1 tablet by mouth every 8 (eight) hours as needed for severe pain (pain score 7-10).   pantoprazole  (PROTONIX ) 40 MG tablet TAKE 1 TABLET BY MOUTH ONCE DAILY BEFORE SUPPER FOR STOMACH PAIN   No facility-administered encounter medications on file as of 09/07/2023.    Past Surgical History:  Procedure  Laterality Date   ABDOMINAL HYSTERECTOMY  11/02/10  dr Clerance Dais  @[facility]    TAH, BSO, incisional hernia repair , lysis of adhesions for endometrial cancer   ANTERIOR LUMBAR FUSION  01-28-2008   dr elsner  @[facility]    L2 -- L4   BACK SURGERY     BUNIONECTOMY Right 2003   CATARACT EXTRACTION W/ INTRAOCULAR LENS  IMPLANT, BILATERAL  2007   COLONOSCOPY  last one 12-24-2018   CYSTO/  LEFT RETROGRADE PYELOGRAM/ URETEROSCOPY STENT PLACEMENT/  OPEN URETEROLYSIS WITH OMENTAL FLAP Left 03-11-2009    dr borden  @[facility]    CYSTO/  [facility] SLING/  ANTERIOR REPAIR  12-06-2001    dr Inga Manges @[facility]    CYSTOSCOPY W/ URETERAL STENT PLACEMENT Left 12/07/2017   Procedure: CYSTOSCOPY WITH RETROGRADE PYELOGRAM/URETERAL STENT PLACEMENT;  Surgeon: Florencio Hunting, MD;  Location: [facility];  Service: Urology;  Laterality: Left;   CYSTOSCOPY W/ URETERAL STENT PLACEMENT Left 03/29/2018   Procedure: CYSTOSCOPY WITH STENT  EXCHANGE;  Surgeon: Florencio Hunting, MD;  Location: [facility];  Service: Urology;  Laterality: Left;   CYSTOSCOPY WITH RETROGRADE PYELOGRAM, URETEROSCOPY AND STENT PLACEMENT Left 12-29-2008   dr Rozanne Corners  @[facility]    WITH BALLOON DILATION LEFT URETERAL STRICTURE   CYSTOSCOPY WITH RETROGRADE PYELOGRAM, URETEROSCOPY AND STENT PLACEMENT Left 09-12-2008    dr Inga Manges @[facility]    CYSTOSCOPY WITH RETROGRADE PYELOGRAM, URETEROSCOPY AND STENT PLACEMENT Left 03/23/2020   Procedure: CYSTOSCOPY WITH LEFT RETROGRADE PYELOGRAM, AND LEFT STENT REMOVAL;  Surgeon: Florencio Hunting, MD;  Location: [facility];  Service: Urology;  Laterality: Left;   CYSTOSCOPY WITH STENT PLACEMENT Left 07/26/2018   Procedure: CYSTOSCOPY WITH STENT CHANGE;  Surgeon: Florencio Hunting, MD;  Location: [facility];  Service: Urology;  Laterality: Left;   CYSTOSCOPY WITH STENT PLACEMENT Left 12/03/2018   Procedure: CYSTOSCOPY WITH STENT CHANGE;  Surgeon: Florencio Hunting, MD;  Location: [facility];  Service: Urology;  Laterality: Left;   CYSTOSCOPY WITH STENT PLACEMENT Left 04/22/2019   Procedure:  CYSTOSCOPY WITH STENT CHANGE;  Surgeon: Florencio Hunting, MD;  Location: [facility];  Service: Urology;  Laterality: Left;   CYSTOSCOPY WITH STENT PLACEMENT Left 10/03/2019   Procedure: CYSTOSCOPY WITH STENT CHANGE;  Surgeon: Florencio Hunting, MD;  Location: [facility];  Service: Urology;  Laterality: Left;   EYE SURGERY     FOOT SURGERY Left 06-23-2010   dr Ronda Cocks   left first and second toes   INSERTION OF MESH N/A 04/23/2013   Procedure: INSERTION OF MESH;  Surgeon: Eddye Goodie, MD;  Location: [facility];  Service: General;  Laterality: N/A;   JOINT REPLACEMENT     KNEE ARTHROSCOPY Right 04-27-2007  dr Mevelyn Acton @[facility]    LAPAROSCOPIC LYSIS OF ADHESIONS N/A 04/23/2013   Procedure: LAPAROSCOPIC LYSIS OF ADHESIONS;  Surgeon: Eddye Goodie, MD;  Location: [facility];  Service: General;  Laterality:  N/A;   LUMBAR SPINE SURGERY  1989 and 1999   POSTERIOR LUMBAR FUSION  12/17/2012   L3 -- L5 laminectomy and L3 -- 5 fusion   TOTAL HIP ARTHROPLASTY Left 07/04/2014   Procedure: LEFT TOTAL HIP ARTHROPLASTY ANTERIOR APPROACH;  Surgeon: Aurther Blue, MD;  Location: [facility];  Service: Orthopedics;  Laterality: Left;   TOTAL HIP ARTHROPLASTY Right 2009   TOTAL KNEE ARTHROPLASTY Right 09-15-2009   dr Mevelyn Acton @[facility]    VENTRAL HERNIA REPAIR N/A 07/19/2012   Procedure: LAPAROSCOPIC LYSIS OF ADHESIONS, SMALL BOWEL RESECTION, SEROSAL REPAIR, PRIMARY VENTRAL HERNIA REPAIR;  Surgeon: Eddye Goodie, MD;  Location: [facility];  Service: General;  Laterality: N/A;   VENTRAL HERNIA REPAIR N/A 04/23/2013   Procedure: LAPAROSCOPIC exploration and repair of hernia in abdominal VENTRAL wall  HERNIA;  Surgeon: Eddye Goodie, MD;  Location: [facility];  Service: General;  Laterality: N/A;    Family History  Problem Relation Age of Onset   Diabetes Mother    Lung cancer Mother    Diabetes Father    Liver cancer Father    Colon cancer Sister        close to 82 per pt   Diabetes Sister    Diabetes Sister     Hypertension Sister    Kidney disease Sister    Seizures Daughter    Diabetes Son    Breast cancer Neg Hx    Allergic rhinitis Neg Hx    Angioedema Neg Hx    Asthma Neg Hx    Atopy Neg Hx    Eczema Neg Hx    Immunodeficiency Neg Hx    Urticaria Neg Hx    Colon polyps Neg Hx    Esophageal cancer Neg Hx    Rectal cancer Neg Hx    Stomach cancer Neg Hx        Review of Systems  Constitutional:  Negative for diaphoresis.  Eyes:  Negative for pain.  Respiratory:  Negative for shortness of breath.   Cardiovascular:  Negative for chest pain, palpitations and leg swelling.  Gastrointestinal:  Negative for abdominal pain.  Endocrine: Negative for polydipsia.  Skin:  Negative for rash.  Neurological:  Negative for dizziness, weakness and headaches.  Hematological:  Does not bruise/bleed easily.  All other systems reviewed and are negative.      Objective:   Physical Exam Vitals and nursing note reviewed.  Constitutional:      General: She is not in acute distress.    Appearance: Normal appearance. She is well-developed.  HENT:     Head: Normocephalic.     Right Ear: Tympanic membrane normal.     Left Ear: Tympanic membrane normal.     Nose: Nose normal.     Mouth/Throat:     Mouth: Mucous membranes are moist.  Eyes:     Pupils: Pupils are equal, round, and reactive to light.  Neck:     Vascular: No carotid bruit or JVD.  Cardiovascular:     Rate and Rhythm: Normal rate and regular rhythm.     Heart sounds: Normal heart sounds.  Pulmonary:     Effort: Pulmonary effort is normal. No respiratory distress.     Breath sounds: Normal breath sounds. No wheezing or rales.  Chest:     Chest wall: No tenderness.  Abdominal:     General: Bowel sounds are normal. There is no distension or abdominal bruit.     Palpations: Abdomen is soft. There is no  hepatomegaly, splenomegaly, mass or pulsatile mass.     Tenderness: There is no abdominal tenderness.  Musculoskeletal:         General: Normal range of motion.     Cervical back: Normal range of motion and neck supple.     Right lower leg: Edema (1+) present.     Left lower leg: Edema (1+) present.  Lymphadenopathy:     Cervical: No cervical adenopathy.  Skin:    General: Skin is warm and dry.  Neurological:     Mental Status: She is alert and oriented to person, place, and time.     Deep Tendon Reflexes: Reflexes are normal and symmetric.  Psychiatric:        Behavior: Behavior normal.        Thought Content: Thought content normal.        Judgment: Judgment normal.    BP (!) 165/72   Pulse 75   Temp (!) 97.4 F (36.3 C) (Temporal)   Ht 5\' 1"  (1.549 m)   Wt 174 lb (78.9 kg)   SpO2 99%   BMI 32.88 kg/m   EKG- [facility]-Mary-Margaret Gaylyn Keas, FNP Chest [facility] [place]- pending radiology report       Assessment & Plan:   [name] comes in today with chief complaint of annual physical  Diagnosis and orders addressed:  1. Primary hypertension Low sodium diet Increased lisinopril  to 40 mg daily - lisinopril  20mg  2 po daily #180 1 refill - CBC with Differential/Platelet - CMP14+EGFR  2. Hyperlipidemia LDL goal <70 Low fat diet - Lipid panel - atorvastatin  (LIPITOR) 40 MG tablet; Take 1 tablet (40 mg total) by mouth daily.  Dispense: 90 tablet; Refill: 1  3. Peripheral polyneuropathy Do not go barefooted  4. Peripheral edema Elevate legs when sitting  5. Pseudoarthrosis L3- L5 Back stretches Pain meds filled - meloxicam  (MOBIC ) 7.5 MG tablet; Take 1 tablet (7.5 mg total) by mouth daily.  Dispense: 90 tablet; Refill: 0  6. Obesity (BMI 30-39.9) Discussed diet and exercise for person with BMI >25 Will recheck weight in 3-6 months    Labs pending Health Maintenance reviewed Diet and exercise encouraged  Follow up plan: 3 months pain management   Mary-Margaret Gaylyn Keas, FNP

## 2023-09-07 NOTE — Patient Instructions (Signed)
 Fall Prevention in the Home, Adult Falls can cause injuries and can happen to people of all ages. There are many things you can do to make your home safer and to help prevent falls. What actions can I take to prevent falls? General information Use good lighting in all rooms. Make sure to: Replace any light bulbs that burn out. Turn on the lights in dark areas and use night-lights. Keep items that you use often in easy-to-reach places. Lower the shelves around your home if needed. Move furniture so that there are clear paths around it. Do not use throw rugs or other things on the floor that can make you trip. If any of your floors are uneven, fix them. Add color or contrast paint or tape to clearly mark and help you see: Grab bars or handrails. First and last steps of staircases. Where the edge of each step is. If you use a ladder or stepladder: Make sure that it is fully opened. Do not climb a closed ladder. Make sure the sides of the ladder are locked in place. Have someone hold the ladder while you use it. Know where your pets are as you move through your home. What can I do in the bathroom?     Keep the floor dry. Clean up any water on the floor right away. Remove soap buildup in the bathtub or shower. Buildup makes bathtubs and showers slippery. Use non-skid mats or decals on the floor of the bathtub or shower. Attach bath mats securely with double-sided, non-slip rug tape. If you need to sit down in the shower, use a non-slip stool. Install grab bars by the toilet and in the bathtub and shower. Do not use towel bars as grab bars. What can I do in the bedroom? Make sure that you have a light by your bed that is easy to reach. Do not use any sheets or blankets on your bed that hang to the floor. Have a firm chair or bench with side arms that you can use for support when you get dressed. What can I do in the kitchen? Clean up any spills right away. If you need to reach something  above you, use a step stool with a grab bar. Keep electrical cords out of the way. Do not use floor polish or wax that makes floors slippery. What can I do with my stairs? Do not leave anything on the stairs. Make sure that you have a light switch at the top and the bottom of the stairs. Make sure that there are handrails on both sides of the stairs. Fix handrails that are broken or loose. Install non-slip stair treads on all your stairs if they do not have carpet. Avoid having throw rugs at the top or bottom of the stairs. Choose a carpet that does not hide the edge of the steps on the stairs. Make sure that the carpet is firmly attached to the stairs. Fix carpet that is loose or worn. What can I do on the outside of my home? Use bright outdoor lighting. Fix the edges of walkways and driveways and fix any cracks. Clear paths of anything that can make you trip, such as tools or rocks. Add color or contrast paint or tape to clearly mark and help you see anything that might make you trip as you walk through a door, such as a raised step or threshold. Trim any bushes or trees on paths to your home. Check to see if handrails are loose  or broken and that both sides of all steps have handrails. Install guardrails along the edges of any raised decks and porches. Have leaves, snow, or ice cleared regularly. Use sand, salt, or ice melter on paths if you live where there is ice and snow during the winter. Clean up any spills in your garage right away. This includes grease or oil spills. What other actions can I take? Review your medicines with your doctor. Some medicines can cause dizziness or changes in blood pressure, which increase your risk of falling. Wear shoes that: Have a low heel. Do not wear high heels. Have rubber bottoms and are closed at the toe. Feel good on your feet and fit well. Use tools that help you move around if needed. These include: Canes. Walkers. Scooters. Crutches. Ask  your doctor what else you can do to help prevent falls. This may include seeing a physical therapist to learn to do exercises to move better and get stronger. Where to find more information Centers for Disease Control and Prevention, [employer]: TonerPromos.no [employer] on Aging: BaseRingTones.pl National Institute on Aging: BaseRingTones.pl Contact a doctor if: You are afraid of falling at home. You feel weak, drowsy, or dizzy at home. You fall at home. Get help right away if you: Lose consciousness or have trouble moving after a fall. Have a fall that causes a head injury. These symptoms may be an emergency. Get help right away. Call 911. Do not wait to see if the symptoms will go away. Do not drive yourself to the hospital. This information is not intended to replace advice given to you by your health care provider. Make sure you discuss any questions you have with your health care provider. Document Revised: 12/27/2021 Document Reviewed: 12/27/2021 Elsevier Patient Education  2024 [employer].

## 2023-09-08 ENCOUNTER — Other Ambulatory Visit: Payer: Self-pay

## 2023-09-08 DIAGNOSIS — S32009K Unspecified fracture of unspecified lumbar vertebra, subsequent encounter for fracture with nonunion: Secondary | ICD-10-CM

## 2023-09-08 LAB — CMP14+EGFR
ALT: 22 IU/L (ref 0–32)
AST: 23 IU/L (ref 0–40)
Albumin: 4.4 g/dL (ref 3.8–4.8)
Alkaline Phosphatase: 59 IU/L (ref 44–121)
BUN/Creatinine Ratio: 20 (ref 12–28)
BUN: 18 mg/dL (ref 8–27)
Bilirubin Total: 0.5 mg/dL (ref 0.0–1.2)
CO2: 22 mmol/L (ref 20–29)
Calcium: 9.5 mg/dL (ref 8.7–10.3)
Chloride: 107 mmol/L — ABNORMAL HIGH (ref 96–106)
Creatinine, Ser: 0.89 mg/dL (ref 0.57–1.00)
Globulin, Total: 2.1 g/dL (ref 1.5–4.5)
Glucose: 91 mg/dL (ref 70–99)
Potassium: 4.7 mmol/L (ref 3.5–5.2)
Sodium: 143 mmol/L (ref 134–144)
Total Protein: 6.5 g/dL (ref 6.0–8.5)
eGFR: 66 mL/min/{1.73_m2} (ref >59)

## 2023-09-08 LAB — CBC WITH DIFFERENTIAL/PLATELET
Basophils Absolute: 0 10*3/uL (ref 0.0–0.2)
Basos: 0 %
EOS (ABSOLUTE): 0.2 10*3/uL (ref 0.0–0.4)
Eos: 3 %
Hematocrit: 38.5 % (ref 34.0–46.6)
Hemoglobin: 12.9 g/dL (ref 11.1–15.9)
Immature Grans (Abs): 0 10*3/uL (ref 0.0–0.1)
Immature Granulocytes: 0 %
Lymphocytes Absolute: 2.2 10*3/uL (ref 0.7–3.1)
Lymphs: 29 %
MCH: 32.7 pg (ref 26.6–33.0)
MCHC: 33.5 g/dL (ref 31.5–35.7)
MCV: 98 fL — ABNORMAL HIGH (ref 79–97)
Monocytes Absolute: 0.5 10*3/uL (ref 0.1–0.9)
Monocytes: 6 %
Neutrophils Absolute: 4.5 10*3/uL (ref 1.4–7.0)
Neutrophils: 62 %
Platelets: 310 10*3/uL (ref 150–450)
RBC: 3.95 x10E6/uL (ref 3.77–5.28)
RDW: 12.1 % (ref 11.7–15.4)
WBC: 7.4 10*3/uL (ref 3.4–10.8)

## 2023-09-08 LAB — LIPID PANEL
Chol/HDL Ratio: 1.7 ratio (ref 0.0–4.4)
Cholesterol, Total: 153 mg/dL (ref 100–199)
HDL: 89 mg/dL (ref >39)
LDL Chol Calc (NIH): 52 mg/dL (ref 0–99)
Triglycerides: 60 mg/dL (ref 0–149)
VLDL Cholesterol Cal: 12 mg/dL (ref 5–40)

## 2023-09-08 LAB — THYROID PANEL WITH TSH
Free Thyroxine Index: 1.8 (ref 1.2–4.9)
T3 Uptake Ratio: 30 % (ref 24–39)
T4, Total: 6.1 ug/dL (ref 4.5–12.0)
TSH: 0.673 u[IU]/mL (ref 0.450–4.500)

## 2023-09-08 LAB — VITAMIN D 25 HYDROXY (VIT D DEFICIENCY, FRACTURES): Vit D, 25-Hydroxy: 32 ng/mL (ref 30.0–100.0)

## 2023-09-08 MED ORDER — METHOCARBAMOL 500 MG PO TABS
500.0000 mg | ORAL_TABLET | Freq: Every day | ORAL | 1 refills | Status: DC
Start: 2023-09-08 — End: 2024-02-26

## 2023-09-11 ENCOUNTER — Telehealth: Payer: Self-pay | Admitting: Pharmacy Technician

## 2023-09-11 ENCOUNTER — Other Ambulatory Visit (HOSPITAL_COMMUNITY): Payer: Self-pay

## 2023-09-11 MED ORDER — TIZANIDINE HCL 2 MG PO CAPS: 2.0000 mg | ORAL_CAPSULE | Freq: Three times a day (TID) | ORAL | 0 refills | Status: DC

## 2023-09-11 NOTE — Telephone Encounter (Signed)
 Left detailed message on patients voicemail that original rx was not covered by insurance and an alternative was sent in

## 2023-09-11 NOTE — Telephone Encounter (Signed)
 Good afternoon [name], [name] insurance prefer tizanidine or cyclobenzaprine as they are both on her drug formulary and methocarbamol  is a non preferred medication through her insurance.  However if she has tried and failed tizanidine or cyclobenzaprine or if there is a medical reason that she can not take what is on her formulary then I just need that information documented in her chart and we can send that info to her insurance.

## 2023-09-11 NOTE — Telephone Encounter (Signed)
 Changed robaxin  to tizanidine 2mg - new prescription sent to pharmacy

## 2023-09-11 NOTE — Telephone Encounter (Signed)
 Pharmacy Patient Advocate Encounter   Received notification from CoverMyMeds that prior authorization for Methocarbamol  500MG  tablets is required/requested.   Insurance verification completed.   The patient is insured through [facility] .   Per test claim:  TIZANIDINE  is preferred by the insurance.  If suggested medication is appropriate, Please send in a new RX and discontinue this one. If not, please advise as to why it's not appropriate so that we may request a Prior Authorization. Please note, some preferred medications may still require a PA.  If the suggested medications have not been trialed and there are no contraindications to their use, the PA will not be submitted, as it will not be approved.   The patient's copay for tizanidine is $0.00 for 1 month supply

## 2023-09-13 ENCOUNTER — Other Ambulatory Visit (HOSPITAL_COMMUNITY): Payer: Self-pay

## 2023-09-19 NOTE — Progress Notes (Unsigned)
 Subjective:  Chief complaint: follow-up for chronic urinary symptoms   Patient ID: [name], female    DOB: March 23, 1946, 78 y.o.   MRN: [id number]  HPI   history of back surgery and "organ displacement" has been experiencing chronic urinary symptoms since 2009. She reports a history of urinary tract infections, with one severe enough to cause bacteremia. She describes her symptoms as intermittent, including difficulty emptying her bladder, burning sensation during urination, and occasional nausea. These symptoms sometimes they have improved with antibiotic treatment but have a tendency to recur. The patient also takes diuretics, which seem to aid in urination. She has been under the care of a urologist and has had her bladder function evaluated, which was reported as normal.     My partner Dr. Shereen Dike had seen her as a Consult and was skeptical that majority of her symptoms  but decided to give her a 7 day course of ertapenem. Please see his extensive and thorough note re her history and microbiology data.     She states that her symptoms did improve while on IV antibiotics and her home testing of her urine showed no nitrites etc. Symptoms then did recur and her home testing was also again abnormal. She currently does not have any compelling symptoms to suggest and active UTI.   At her visit wwith me in March I started her on premarin  vaginal cream.  Discussed the use of AI scribe software for clinical note transcription with the patient, who gave verbal consent to proceed.  History of Present Illness   Whitney HAUPTMAN is a 78 year old female with chronic urinary symptoms who presents for follow-up.  She hasn chronic urinary symptoms but also actual  recurrent urinary tract infections att imes. She states this is due to a structural issue causing urine retention in a U-shaped area from her bladder.   As mentioned due to her complete lack of response to Invanz IV we have disproved in  that specific case that she actually had a UTI given lack of response to antibiotics.    She uses a vaginal cream twice a week, which has provided some relief. Currently, she is asymptomatic and has been symptom-free for a couple of months since before seeing me last.       Past Medical History:  Diagnosis Date   Arthritis    Bacteriuria 07/10/2023   Cancer (HCC)    ovarian,skin Ca. in mouth   Cataract    Bil cataracts removed   Chronic constipation    Chronic kidney disease    Chronic midline low back pain with sciatica    Colon polyp    large ascending colon polyp , scheduled for resection 02/ 2021   Full dentures    History of endometrial cancer 2012   s/p   TAH w/ BSO 11-02-2010   History of recurrent UTIs    History of sepsis    multiple times (some due to e.coli) last sepsis 07/ 2019   History of small bowel obstruction    x2  ,  hx bowel resection   HOH (hard of hearing)    Hyperlipidemia    Hypertension    Lower urinary tract symptoms (LUTS) 07/10/2023   Retroperitoneal fibrosis    Substance abuse (HCC)    opiod dependancy   Ureteral obstruction, left urologist-- dr Rozanne Corners   secondary to adhesions/ retroperitoneal fibrosis, treated with ureteral stent   Vertigo    Wears glasses  Past Surgical History:  Procedure Laterality Date   ABDOMINAL HYSTERECTOMY  11/02/10  dr Clerance Dais  @[facility]    TAH, BSO, incisional hernia repair , lysis of adhesions for endometrial cancer   ANTERIOR LUMBAR FUSION  01-28-2008   dr elsner  @[facility]    L2 -- L4   BACK SURGERY     BUNIONECTOMY Right 2003   CATARACT EXTRACTION W/ INTRAOCULAR LENS  IMPLANT, BILATERAL  2007   COLONOSCOPY  last one 12-24-2018   CYSTO/  LEFT RETROGRADE PYELOGRAM/ URETEROSCOPY STENT PLACEMENT/  OPEN URETEROLYSIS WITH OMENTAL FLAP Left 03-11-2009    dr borden  @[facility]    CYSTO/  [facility] SLING/  ANTERIOR REPAIR  12-06-2001    dr Inga Manges @[facility]    CYSTOSCOPY W/ URETERAL STENT PLACEMENT Left 12/07/2017   Procedure: CYSTOSCOPY WITH  RETROGRADE PYELOGRAM/URETERAL STENT PLACEMENT;  Surgeon: Florencio Hunting, MD;  Location: [facility];  Service: Urology;  Laterality: Left;   CYSTOSCOPY W/ URETERAL STENT PLACEMENT Left 03/29/2018   Procedure: CYSTOSCOPY WITH STENT  EXCHANGE;  Surgeon: Florencio Hunting, MD;  Location: [facility];  Service: Urology;  Laterality: Left;   CYSTOSCOPY WITH RETROGRADE PYELOGRAM, URETEROSCOPY AND STENT PLACEMENT Left 12-29-2008   dr Rozanne Corners  @[facility]    WITH BALLOON DILATION LEFT URETERAL STRICTURE   CYSTOSCOPY WITH RETROGRADE PYELOGRAM, URETEROSCOPY AND STENT PLACEMENT Left 09-12-2008    dr Inga Manges @[facility]    CYSTOSCOPY WITH RETROGRADE PYELOGRAM, URETEROSCOPY AND STENT PLACEMENT Left 03/23/2020   Procedure: CYSTOSCOPY WITH LEFT RETROGRADE PYELOGRAM, AND LEFT STENT REMOVAL;  Surgeon: Florencio Hunting, MD;  Location: [facility];  Service: Urology;  Laterality: Left;   CYSTOSCOPY WITH STENT PLACEMENT Left 07/26/2018   Procedure: CYSTOSCOPY WITH STENT CHANGE;  Surgeon: Florencio Hunting, MD;  Location: [facility];  Service: Urology;  Laterality: Left;   CYSTOSCOPY WITH STENT PLACEMENT Left 12/03/2018   Procedure: CYSTOSCOPY WITH STENT CHANGE;  Surgeon: Florencio Hunting, MD;  Location: [facility];  Service: Urology;  Laterality: Left;   CYSTOSCOPY WITH STENT PLACEMENT Left 04/22/2019   Procedure: CYSTOSCOPY WITH STENT CHANGE;  Surgeon: Florencio Hunting, MD;  Location: [facility];  Service: Urology;  Laterality: Left;   CYSTOSCOPY WITH STENT PLACEMENT Left 10/03/2019   Procedure: CYSTOSCOPY WITH STENT CHANGE;  Surgeon: Florencio Hunting, MD;  Location: [facility];  Service: Urology;  Laterality: Left;   EYE SURGERY     FOOT SURGERY Left 06-23-2010   dr Ronda Cocks   left first and second toes   INSERTION OF MESH N/A 04/23/2013   Procedure: INSERTION OF MESH;  Surgeon: Eddye Goodie, MD;  Location: [facility];  Service: General;  Laterality: N/A;   JOINT REPLACEMENT     KNEE ARTHROSCOPY Right 04-27-2007  dr Mevelyn Acton @[facility]    LAPAROSCOPIC LYSIS OF  ADHESIONS N/A 04/23/2013   Procedure: LAPAROSCOPIC LYSIS OF ADHESIONS;  Surgeon: Eddye Goodie, MD;  Location: [facility];  Service: General;  Laterality: N/A;   LUMBAR SPINE SURGERY  1989 and 1999   POSTERIOR LUMBAR FUSION  12/17/2012   L3 -- L5 laminectomy and L3 -- 5 fusion   TOTAL HIP ARTHROPLASTY Left 07/04/2014   Procedure: LEFT TOTAL HIP ARTHROPLASTY ANTERIOR APPROACH;  Surgeon: Aurther Blue, MD;  Location: [facility];  Service: Orthopedics;  Laterality: Left;   TOTAL HIP ARTHROPLASTY Right 2009   TOTAL KNEE ARTHROPLASTY Right 09-15-2009   dr Mevelyn Acton @[facility]    VENTRAL HERNIA REPAIR N/A 07/19/2012   Procedure: LAPAROSCOPIC LYSIS OF ADHESIONS, SMALL BOWEL RESECTION, SEROSAL REPAIR, PRIMARY VENTRAL HERNIA REPAIR;  Surgeon: Eddye Goodie, MD;  Location: [facility];  Service: General;  Laterality: N/A;   VENTRAL HERNIA REPAIR N/A 04/23/2013   Procedure: LAPAROSCOPIC exploration and repair of hernia in abdominal VENTRAL wall  HERNIA;  Surgeon: Eddye Goodie, MD;  Location: [facility];  Service: General;  Laterality: N/A;    Family History  Problem Relation Age of Onset   Diabetes Mother    Lung cancer Mother    Diabetes Father    Liver cancer Father    Colon cancer Sister        close to 39 per pt   Diabetes Sister    Diabetes Sister    Hypertension Sister    Kidney disease Sister    Seizures Daughter    Diabetes Son    Breast cancer Neg Hx    Allergic rhinitis Neg Hx    Angioedema Neg Hx    Asthma Neg Hx    Atopy Neg Hx    Eczema Neg Hx    Immunodeficiency Neg Hx    Urticaria Neg Hx    Colon polyps Neg Hx    Esophageal cancer Neg Hx    Rectal cancer Neg Hx    Stomach cancer Neg Hx       Social History   Socioeconomic History   Marital status: Single    Spouse name: Not on file   Number of children: 3   Years of education: 12   Highest education level: High school graduate  Occupational History   Occupation: Retired  Tobacco Use   Smoking status: Never   Smokeless  tobacco: Never  Vaping Use   Vaping status: Never Used  Substance and Sexual Activity   Alcohol use: No   Drug use: No   Sexual activity: Not Currently    Birth control/protection: Surgical  Other Topics Concern   Not on file  Social History Narrative   Her son and DIL live with her   They help with bills   Social Drivers of Health   Financial Resource Strain: Low Risk  (06/28/2023)   Overall Financial Resource Strain (CARDIA)    Difficulty of Paying Living Expenses: Not hard at all  Food Insecurity: No Food Insecurity (06/28/2023)   Hunger Vital Sign    Worried About Running Out of Food in the Last Year: Never true    Ran Out of Food in the Last Year: Never true  Transportation Needs: No Transportation Needs (06/28/2023)   PRAPARE - Administrator, Civil Service (Medical): No    Lack of Transportation (Non-Medical): No  Physical Activity: Insufficiently Active (06/28/2023)   Exercise Vital Sign    Days of Exercise per Week: 3 days    Minutes of Exercise per Session: 30 min  Stress: No Stress Concern Present (06/28/2023)   [employer] of Occupational Health - Occupational Stress Questionnaire    Feeling of Stress : Not at all  Social Connections: Moderately Isolated (06/28/2023)   Social Connection and Isolation Panel [NHANES]    Frequency of Communication with Friends and Family: More than three times a week    Frequency of Social Gatherings with Friends and Family: Three times a week    Attends Religious Services: More than 4 times per year    Active Member of Clubs or Organizations: No    Attends Banker Meetings: Never    Marital Status: Widowed    Allergies  Allergen Reactions   Cashew Nut Oil Hives   Dog Epithelium Hives and Itching  Sulfa Antibiotics Other (See Comments)    "blistering lips and mouth sores"     Current Outpatient Medications:    atorvastatin  (LIPITOR) 40 MG tablet, Take 1 tablet (40 mg total) by mouth daily., Disp:  90 tablet, Rfl: 1   conjugated estrogens  (PREMARIN ) vaginal cream, Apply 0.5 mg intravaginally once daily x 2 weeks and then twice a week, Disp: 42.5 g, Rfl: 12   furosemide  (LASIX ) 20 MG tablet, Take 1 tablet (20 mg total) by mouth daily., Disp: 90 tablet, Rfl: 0   lisinopril  (ZESTRIL ) 20 MG tablet, Take 2 tablets (40 mg total) by mouth daily., Disp: 90 tablet, Rfl: 1   meloxicam  (MOBIC ) 7.5 MG tablet, Take 1 tablet by mouth once daily, Disp: 90 tablet, Rfl: 0   methocarbamol  (ROBAXIN ) 500 MG tablet, Take 1 tablet (500 mg total) by mouth at bedtime., Disp: 30 tablet, Rfl: 1   mupirocin  ointment (BACTROBAN ) 2 %, Apply 1 Application topically as needed (for iritation)., Disp: , Rfl:    nystatin  cream (MYCOSTATIN ), APPLY TOPICALLY AS NEEDED FOR DRY SKIN, Disp: 30 g, Rfl: 0   [START ON 11/07/2023] oxyCODONE -acetaminophen  (PERCOCET) 7.5-325 MG tablet, Take 1 tablet by mouth every 8 (eight) hours as needed for severe pain (pain score 7-10)., Disp: 90 tablet, Rfl: 0   [START ON 10/08/2023] oxyCODONE -acetaminophen  (PERCOCET) 7.5-325 MG tablet, Take 1 tablet by mouth every 8 (eight) hours as needed for severe pain (pain score 7-10)., Disp: 90 tablet, Rfl: 0   oxyCODONE -acetaminophen  (PERCOCET) 7.5-325 MG tablet, Take 1 tablet by mouth every 8 (eight) hours as needed for severe pain (pain score 7-10)., Disp: 90 tablet, Rfl: 0   pantoprazole  (PROTONIX ) 40 MG tablet, TAKE 1 TABLET BY MOUTH ONCE DAILY BEFORE SUPPER FOR STOMACH PAIN, Disp: 90 tablet, Rfl: 0   tizanidine  (ZANAFLEX ) 2 MG capsule, Take 1 capsule (2 mg total) by mouth 3 (three) times daily., Disp: 30 capsule, Rfl: 0   Review of Systems  Constitutional:  Negative for activity change, appetite change, chills, diaphoresis, fatigue, fever and unexpected weight change.  HENT:  Negative for congestion, rhinorrhea, sinus pressure, sneezing, sore throat and trouble swallowing.   Eyes:  Negative for photophobia and visual disturbance.  Respiratory:  Negative  for cough, chest tightness, shortness of breath, wheezing and stridor.   Cardiovascular:  Negative for chest pain, palpitations and leg swelling.  Gastrointestinal:  Negative for abdominal distention, abdominal pain, anal bleeding, blood in stool, constipation, diarrhea, nausea and vomiting.  Genitourinary:  Negative for difficulty urinating, dysuria, flank pain and hematuria.  Musculoskeletal:  Negative for arthralgias, back pain, gait problem, joint swelling and myalgias.  Skin:  Negative for color change, pallor, rash and wound.  Neurological:  Negative for dizziness, tremors, weakness and light-headedness.  Hematological:  Negative for adenopathy. Does not bruise/bleed easily.  Psychiatric/Behavioral:  Negative for agitation, behavioral problems, confusion, decreased concentration, dysphoric mood and sleep disturbance.        Objective:   Physical Exam Constitutional:      General: She is not in acute distress.    Appearance: Normal appearance. She is well-developed. She is not ill-appearing or diaphoretic.  HENT:     Head: Normocephalic and atraumatic.     Right Ear: Hearing and external ear normal.     Left Ear: Hearing and external ear normal.     Nose: No nasal deformity or rhinorrhea.  Eyes:     General: No scleral icterus.    Conjunctiva/sclera: Conjunctivae normal.     Right eye: Right  conjunctiva is not injected.     Left eye: Left conjunctiva is not injected.     Pupils: Pupils are equal, round, and reactive to light.  Neck:     Vascular: No JVD.  Cardiovascular:     Rate and Rhythm: Normal rate and regular rhythm.     Heart sounds: S1 normal and S2 normal.  Pulmonary:     Effort: No respiratory distress.     Breath sounds: No wheezing.  Abdominal:     General: There is no distension.     Palpations: Abdomen is soft.  Musculoskeletal:        General: Normal range of motion.     Right shoulder: Normal.     Left shoulder: Normal.     Cervical back: Normal range  of motion and neck supple.     Right hip: Normal.     Left hip: Normal.     Right knee: Normal.     Left knee: Normal.  Lymphadenopathy:     Head:     Right side of head: No submandibular, preauricular or posterior auricular adenopathy.     Left side of head: No submandibular, preauricular or posterior auricular adenopathy.     Cervical: No cervical adenopathy.     Right cervical: No superficial or deep cervical adenopathy.    Left cervical: No superficial or deep cervical adenopathy.  Skin:    General: Skin is warm and dry.     Coloration: Skin is not pale.     Findings: No abrasion, bruising, ecchymosis, erythema, lesion or rash.     Nails: There is no clubbing.  Neurological:     Mental Status: She is alert and oriented to person, place, and time.     Sensory: No sensory deficit.     Coordination: Coordination normal.     Gait: Gait normal.  Psychiatric:        Attention and Perception: She is attentive.        Speech: Speech normal.        Behavior: Behavior normal. Behavior is cooperative.        Thought Content: Thought content normal.        Judgment: Judgment normal.           Assessment & Plan:   Assessment and Plan    Chronic urinary symptoms Chronic urinary symptoms with recurrent UTIs at times. - Continue vaginal cream twice weekly. - Follow up in 6 months or sooner if symptoms recur or worsen. - Coordinate with hospital staff if admitted for other reasons to ensure appropriate management of urinary symptoms. and AVOIDING unnecessary culturing of urine and treatment of asymptomatic bacteruria

## 2023-09-20 ENCOUNTER — Ambulatory Visit: Payer: Self-pay | Admitting: Infectious Disease

## 2023-09-20 ENCOUNTER — Other Ambulatory Visit: Payer: Self-pay

## 2023-09-20 ENCOUNTER — Encounter: Payer: Self-pay | Admitting: Infectious Disease

## 2023-09-20 VITALS — BP 138/78 | HR 64 | Temp 97.7°F | Ht 60.0 in | Wt 177.0 lb

## 2023-09-20 DIAGNOSIS — R399 Unspecified symptoms and signs involving the genitourinary system: Secondary | ICD-10-CM

## 2023-09-20 DIAGNOSIS — R8271 Bacteriuria: Secondary | ICD-10-CM | POA: Diagnosis not present

## 2023-09-20 DIAGNOSIS — N390 Urinary tract infection, site not specified: Secondary | ICD-10-CM | POA: Diagnosis not present

## 2023-09-27 ENCOUNTER — Ambulatory Visit: Payer: Self-pay

## 2023-09-27 NOTE — Telephone Encounter (Signed)
 Chief Complaint: Cough with clear sputum Symptoms: Runny nose, severity cough 6/10, cold sweats Frequency: Onset last Thursday Pertinent Negatives: Patient denies other symptoms Disposition: [] ED /[] Urgent Care (no appt availability in office) / [x] Appointment(In office/virtual)/ []  [facility]/ [] Home Care/ [] Refused Recommended Disposition /[] [facility]/ []  Follow-up with PCP Additional Notes: No availability with PCP, scheduled tomorrow with DOD.    Copied from CRM [id number]. Topic: Clinical - Red Word Triage >> Sep 27, 2023  1:45 PM Thliyah D wrote: Terrible cough for a week and breaking out in cold sweats when she get's up and goes to work and do things. Reason for Disposition  Cough has been present for > 3 weeks  Answer Assessment - Initial Assessment Questions 1. ONSET: "When did the cough begin?"      1 week ago last Thursday  2. SEVERITY: "How bad is the cough today?"      6/10 3. SPUTUM: "Describe the color of your sputum" (none, dry cough; clear, white, yellow, green)     Clear, thick 4. HEMOPTYSIS: "Are you coughing up any blood?" If so ask: "How much?" (flecks, streaks, tablespoons, etc.)     No 5. DIFFICULTY BREATHING: "Are you having difficulty breathing?" If Yes, ask: "How bad is it?" (e.g., mild, moderate, severe)    - MILD: No SOB at rest, mild SOB with walking, speaks normally in sentences, can lie down, no retractions, pulse < 100.    - MODERATE: SOB at rest, SOB with minimal exertion and prefers to sit, cannot lie down flat, speaks in phrases, mild retractions, audible wheezing, pulse 100-120.    - SEVERE: Very SOB at rest, speaks in single words, struggling to breathe, sitting hunched forward, retractions, pulse > 120      No 6. FEVER: "Do you have a fever?" If Yes, ask: "What is your temperature, how was it measured, and when did it start?"     Not checked, break out in cold sweat 7. CARDIAC HISTORY: "Do you have any history of heart  disease?" (e.g., heart attack, congestive heart failure)      No 8. LUNG HISTORY: "Do you have any history of lung disease?"  (e.g., pulmonary embolus, asthma, emphysema)     No 9. PE RISK FACTORS: "Do you have a history of blood clots?" (or: recent major surgery, recent prolonged travel, bedridden)     No 10. OTHER SYMPTOMS: "Do you have any other symptoms?" (e.g., runny nose, wheezing, chest pain)       Runny nose  Protocols used: Cough - Acute Productive-A-AH

## 2023-09-28 ENCOUNTER — Encounter: Payer: Self-pay | Admitting: Family Medicine

## 2023-09-28 ENCOUNTER — Ambulatory Visit (INDEPENDENT_AMBULATORY_CARE_PROVIDER_SITE_OTHER): Admitting: Family Medicine

## 2023-09-28 VITALS — BP 129/70 | HR 70 | Temp 97.9°F | Ht 61.0 in | Wt 173.4 lb

## 2023-09-28 DIAGNOSIS — R062 Wheezing: Secondary | ICD-10-CM

## 2023-09-28 DIAGNOSIS — J069 Acute upper respiratory infection, unspecified: Secondary | ICD-10-CM | POA: Diagnosis not present

## 2023-09-28 DIAGNOSIS — R509 Fever, unspecified: Secondary | ICD-10-CM

## 2023-09-28 MED ORDER — PREDNISONE 20 MG PO TABS: 40.0000 mg | ORAL_TABLET | Freq: Every day | ORAL | 0 refills | Status: AC

## 2023-09-28 MED ORDER — AMOXICILLIN-POT CLAVULANATE 875-125 MG PO TABS
1.0000 | ORAL_TABLET | Freq: Two times a day (BID) | ORAL | 0 refills | Status: AC
Start: 2023-09-28 — End: 2023-10-05

## 2023-09-28 MED ORDER — ALBUTEROL SULFATE HFA 108 (90 BASE) MCG/ACT IN AERS: 2.0000 | INHALATION_SPRAY | Freq: Four times a day (QID) | RESPIRATORY_TRACT | 2 refills | Status: AC | PRN

## 2023-09-28 NOTE — Progress Notes (Signed)
 Subjective:  Patient ID: [name], female    DOB: February 08, 1946, 78 y.o.   MRN: [id number]  Patient Care Team: Delfina Feller, FNP as PCP - General (Family Medicine) Andra Kava, MD as Consulting Physician (Gynecologic Oncology) Florencio Hunting, MD as Consulting Physician (Urology) Kearney Passer, MD as Consulting Physician (Pain Medicine) Dominic Friendly Cordelia Dessert, MD as Consulting Physician (Gastroenterology) Candyce Champagne, MD as Consulting Physician (General Surgery) Jamesetta Mcbride, MD as Consulting Physician (Infectious Diseases) [place] Scot Cutter, MD as Consulting Physician (Ophthalmology)   Chief Complaint:  Cough and Nasal Congestion (X 1 week )   HPI: [name] is a 78 y.o. female presenting on 09/28/2023 for Cough and Nasal Congestion (X 1 week )   [name] is a 78 year old female who presents with a chest cold and cough.  She has experienced symptoms for one week, including a severe cough and low-grade fever. The cough is described as 'fleeing' and is non-productive. She has been managing her symptoms at home with aspirin and occasional doses of cough syrup, which she finds unpleasant.  She uses medication for a runny nose due to allergies, specifically mentioning an allergy to dogs. Her appetite and hydration are normal, with no issues in urination or bowel movements. No ear pain or other symptoms beyond the cough and chest congestion are reported.  No ear pain, phlegm production, or other symptoms beyond the cough and chest congestion.        Relevant past medical, surgical, family, and social history reviewed and updated as indicated.  Allergies and medications reviewed and updated. Data reviewed: Chart in Epic.   Past Medical History:  Diagnosis Date   Arthritis    Bacteriuria 07/10/2023   Cancer (HCC)    ovarian,skin Ca. in mouth   Cataract    Bil cataracts removed   Chronic constipation    Chronic kidney disease    Chronic  midline low back pain with sciatica    Colon polyp    large ascending colon polyp , scheduled for resection 02/ 2021   Full dentures    History of endometrial cancer 2012   s/p   TAH w/ BSO 11-02-2010   History of recurrent UTIs    History of sepsis    multiple times (some due to e.coli) last sepsis 07/ 2019   History of small bowel obstruction    x2  ,  hx bowel resection   HOH (hard of hearing)    Hyperlipidemia    Hypertension    Lower urinary tract symptoms (LUTS) 07/10/2023   Retroperitoneal fibrosis    Substance abuse (HCC)    opiod dependancy   Ureteral obstruction, left urologist-- dr Rozanne Corners   secondary to adhesions/ retroperitoneal fibrosis, treated with ureteral stent   Vertigo    Wears glasses     Past Surgical History:  Procedure Laterality Date   ABDOMINAL HYSTERECTOMY  11/02/10  dr Clerance Dais  @[facility]    TAH, BSO, incisional hernia repair , lysis of adhesions for endometrial cancer   ANTERIOR LUMBAR FUSION  01-28-2008   dr elsner  @[facility]    L2 -- L4   BACK SURGERY     BUNIONECTOMY Right 2003   CATARACT EXTRACTION W/ INTRAOCULAR LENS  IMPLANT, BILATERAL  2007   COLONOSCOPY  last one 12-24-2018   CYSTO/  LEFT RETROGRADE PYELOGRAM/ URETEROSCOPY STENT PLACEMENT/  OPEN URETEROLYSIS WITH OMENTAL FLAP Left 03-11-2009    dr borden  @[facility]    CYSTO/  SPARC  SLING/  ANTERIOR REPAIR  12-06-2001    dr Inga Manges @[facility]    CYSTOSCOPY W/ URETERAL STENT PLACEMENT Left 12/07/2017   Procedure: CYSTOSCOPY WITH RETROGRADE PYELOGRAM/URETERAL STENT PLACEMENT;  Surgeon: Florencio Hunting, MD;  Location: [facility];  Service: Urology;  Laterality: Left;   CYSTOSCOPY W/ URETERAL STENT PLACEMENT Left 03/29/2018   Procedure: CYSTOSCOPY WITH STENT  EXCHANGE;  Surgeon: Florencio Hunting, MD;  Location: [facility];  Service: Urology;  Laterality: Left;   CYSTOSCOPY WITH RETROGRADE PYELOGRAM, URETEROSCOPY AND STENT PLACEMENT Left 12-29-2008   dr Rozanne Corners  @[facility]    WITH BALLOON DILATION LEFT URETERAL STRICTURE   CYSTOSCOPY WITH  RETROGRADE PYELOGRAM, URETEROSCOPY AND STENT PLACEMENT Left 09-12-2008    dr Inga Manges @[facility]    CYSTOSCOPY WITH RETROGRADE PYELOGRAM, URETEROSCOPY AND STENT PLACEMENT Left 03/23/2020   Procedure: CYSTOSCOPY WITH LEFT RETROGRADE PYELOGRAM, AND LEFT STENT REMOVAL;  Surgeon: Florencio Hunting, MD;  Location: [facility];  Service: Urology;  Laterality: Left;   CYSTOSCOPY WITH STENT PLACEMENT Left 07/26/2018   Procedure: CYSTOSCOPY WITH STENT CHANGE;  Surgeon: Florencio Hunting, MD;  Location: [facility];  Service: Urology;  Laterality: Left;   CYSTOSCOPY WITH STENT PLACEMENT Left 12/03/2018   Procedure: CYSTOSCOPY WITH STENT CHANGE;  Surgeon: Florencio Hunting, MD;  Location: [facility];  Service: Urology;  Laterality: Left;   CYSTOSCOPY WITH STENT PLACEMENT Left 04/22/2019   Procedure: CYSTOSCOPY WITH STENT CHANGE;  Surgeon: Florencio Hunting, MD;  Location[facility];  Service: Urology;  Laterality: Left;   CYSTOSCOPY WITH STENT PLACEMENT Left 10/03/2019   Procedure: CYSTOSCOPY WITH STENT CHANGE;  Surgeon: Florencio Hunting, MD;  Location: [facility];  Service: Urology;  Laterality: Left;   EYE SURGERY     FOOT SURGERY Left 06-23-2010   dr Ronda Cocks   left first and second toes   INSERTION OF MESH N/A 04/23/2013   Procedure: INSERTION OF MESH;  Surgeon: Eddye Goodie, MD;  Location: [facility];  Service: General;  Laterality: N/A;   JOINT REPLACEMENT     KNEE ARTHROSCOPY Right 04-27-2007  dr Mevelyn Acton @[facility]    LAPAROSCOPIC LYSIS OF ADHESIONS N/A 04/23/2013   Procedure: LAPAROSCOPIC LYSIS OF ADHESIONS;  Surgeon: Eddye Goodie, MD;  Location: [facility];  Service: General;  Laterality: N/A;   LUMBAR SPINE SURGERY  1989 and 1999   POSTERIOR LUMBAR FUSION  12/17/2012   L3 -- L5 laminectomy and L3 -- 5 fusion   TOTAL HIP ARTHROPLASTY Left 07/04/2014   Procedure: LEFT TOTAL HIP ARTHROPLASTY ANTERIOR APPROACH;  Surgeon: Aurther Blue, MD;  Location: [facility];  Service: Orthopedics;  Laterality: Left;   TOTAL HIP ARTHROPLASTY Right  2009   TOTAL KNEE ARTHROPLASTY Right 09-15-2009   dr Mevelyn Acton @[facility]    VENTRAL HERNIA REPAIR N/A 07/19/2012   Procedure: LAPAROSCOPIC LYSIS OF ADHESIONS, SMALL BOWEL RESECTION, SEROSAL REPAIR, PRIMARY VENTRAL HERNIA REPAIR;  Surgeon: Eddye Goodie, MD;  Location: [facility];  Service: General;  Laterality: N/A;   VENTRAL HERNIA REPAIR N/A 04/23/2013   Procedure: LAPAROSCOPIC exploration and repair of hernia in abdominal VENTRAL wall  HERNIA;  Surgeon: Eddye Goodie, MD;  Location: [facility];  Service: General;  Laterality: N/A;    Social History   Socioeconomic History   Marital status: Single    Spouse name: Not on file   Number of children: 3   Years of education: 12   Highest education level: High school graduate  Occupational History   Occupation: Retired  Tobacco Use   Smoking status: Never   Smokeless tobacco: Never  Vaping Use   Vaping status: Never Used  Substance and Sexual Activity   Alcohol use: No   Drug use: No   Sexual activity: Not Currently    Birth control/protection: Surgical  Other Topics Concern   Not on file  Social History Narrative   Her son and DIL live with her   They help with bills   Social Drivers of Health   Financial Resource Strain: Low Risk  (06/28/2023)   Overall Financial Resource Strain (CARDIA)    Difficulty of Paying Living Expenses: Not hard at all  Food Insecurity: No Food Insecurity (06/28/2023)   Hunger Vital Sign    Worried About Running Out of Food in the Last Year: Never true    Ran Out of Food in the Last Year: Never true  Transportation Needs: No Transportation Needs (06/28/2023)   PRAPARE - Administrator, Civil Service (Medical): No    Lack of Transportation (Non-Medical): No  Physical Activity: Insufficiently Active (06/28/2023)   Exercise Vital Sign    Days of Exercise per Week: 3 days    Minutes of Exercise per Session: 30 min  Stress: No Stress Concern Present (06/28/2023)   [employer] of Occupational  Health - Occupational Stress Questionnaire    Feeling of Stress : Not at all  Social Connections: Moderately Isolated (06/28/2023)   Social Connection and Isolation Panel [NHANES]    Frequency of Communication with Friends and Family: More than three times a week    Frequency of Social Gatherings with Friends and Family: Three times a week    Attends Religious Services: More than 4 times per year    Active Member of Clubs or Organizations: No    Attends Banker Meetings: Never    Marital Status: Widowed  Intimate Partner Violence: Not At Risk (06/28/2023)   Humiliation, Afraid, Rape, and Kick questionnaire    Fear of Current or Ex-Partner: No    Emotionally Abused: No    Physically Abused: No    Sexually Abused: No    Outpatient Encounter Medications as of 09/28/2023  Medication Sig   albuterol (VENTOLIN HFA) 108 (90 Base) MCG/ACT inhaler Inhale 2 puffs into the lungs every 6 (six) hours as needed for wheezing or shortness of breath.   amoxicillin -clavulanate (AUGMENTIN ) 875-125 MG tablet Take 1 tablet by mouth 2 (two) times daily for 7 days.   atorvastatin  (LIPITOR) 40 MG tablet Take 1 tablet (40 mg total) by mouth daily.   conjugated estrogens  (PREMARIN ) vaginal cream Apply 0.5 mg intravaginally once daily x 2 weeks and then twice a week   furosemide  (LASIX ) 20 MG tablet Take 1 tablet (20 mg total) by mouth daily.   lisinopril  (ZESTRIL ) 20 MG tablet Take 2 tablets (40 mg total) by mouth daily.   meloxicam  (MOBIC ) 7.5 MG tablet Take 1 tablet by mouth once daily   methocarbamol  (ROBAXIN ) 500 MG tablet Take 1 tablet (500 mg total) by mouth at bedtime.   mupirocin  ointment (BACTROBAN ) 2 % Apply 1 Application topically as needed (for iritation).   nystatin  cream (MYCOSTATIN ) APPLY TOPICALLY AS NEEDED FOR DRY SKIN   [START ON 11/07/2023] oxyCODONE -acetaminophen  (PERCOCET) 7.5-325 MG tablet Take 1 tablet by mouth every 8 (eight) hours as needed for severe pain (pain score 7-10).    [START ON 10/08/2023] oxyCODONE -acetaminophen  (PERCOCET) 7.5-325 MG tablet Take 1 tablet by mouth every 8 (eight) hours as needed for severe pain (pain score 7-10).   oxyCODONE -acetaminophen  (PERCOCET) 7.5-325 MG tablet Take 1  tablet by mouth every 8 (eight) hours as needed for severe pain (pain score 7-10).   pantoprazole  (PROTONIX ) 40 MG tablet TAKE 1 TABLET BY MOUTH ONCE DAILY BEFORE SUPPER FOR STOMACH PAIN   predniSONE  (DELTASONE ) 20 MG tablet Take 2 tablets (40 mg total) by mouth daily with breakfast for 5 days.   tizanidine  (ZANAFLEX ) 2 MG capsule Take 1 capsule (2 mg total) by mouth 3 (three) times daily.   No facility-administered encounter medications on file as of 09/28/2023.    Allergies  Allergen Reactions   Cashew Nut Oil Hives   Dog Epithelium Hives and Itching   Sulfa Antibiotics Other (See Comments)    "blistering lips and mouth sores"    Pertinent ROS per HPI, otherwise unremarkable      Objective:  BP 129/70   Pulse 70   Temp 97.9 F (36.6 C)   Ht 5\' 1"  (1.549 m)   Wt 173 lb 6.4 oz (78.7 kg)   SpO2 94%   BMI 32.76 kg/m    Wt Readings from Last 3 Encounters:  09/28/23 173 lb 6.4 oz (78.7 kg)  09/20/23 177 lb (80.3 kg)  09/07/23 174 lb (78.9 kg)    Physical Exam Vitals and nursing note reviewed.  Constitutional:      General: She is not in acute distress.    Appearance: Normal appearance. She is well-developed and well-groomed. She is obese. She is not ill-appearing, toxic-appearing or diaphoretic.  HENT:     Head: Normocephalic and atraumatic.     Jaw: There is normal jaw occlusion.     Right Ear: Hearing normal. A middle ear effusion is present. Tympanic membrane is not erythematous.     Left Ear: Hearing normal. A middle ear effusion is present. Tympanic membrane is not erythematous.     Nose: Congestion present.     Mouth/Throat:     Lips: Pink.     Mouth: Mucous membranes are moist.     Pharynx: Oropharynx is clear. Uvula midline. Posterior  oropharyngeal erythema and postnasal drip present. No pharyngeal swelling, oropharyngeal exudate or uvula swelling.  Eyes:     General: Lids are normal.     Extraocular Movements: Extraocular movements intact.     Conjunctiva/sclera: Conjunctivae normal.     Pupils: Pupils are equal, round, and reactive to light.  Neck:     Thyroid : No thyroid  mass, thyromegaly or thyroid  tenderness.     Vascular: No carotid bruit or JVD.     Trachea: Trachea and phonation normal.  Cardiovascular:     Rate and Rhythm: Normal rate and regular rhythm.     Chest Wall: PMI is not displaced.     Pulses: Normal pulses.     Heart sounds: Normal heart sounds. No murmur heard.    No friction rub. No gallop.  Pulmonary:     Effort: Pulmonary effort is normal. No respiratory distress.     Breath sounds: Wheezing present.  Abdominal:     General: Bowel sounds are normal. There is no distension or abdominal bruit.     Palpations: Abdomen is soft. There is no hepatomegaly or splenomegaly.     Tenderness: There is no abdominal tenderness. There is no right CVA tenderness or left CVA tenderness.     Hernia: No hernia is present.  Musculoskeletal:        General: Normal range of motion.     Cervical back: Normal range of motion and neck supple.     Right lower leg: No  edema.     Left lower leg: No edema.  Lymphadenopathy:     Cervical: No cervical adenopathy.  Skin:    General: Skin is warm and dry.     Capillary Refill: Capillary refill takes less than 2 seconds.     Coloration: Skin is not cyanotic, jaundiced or pale.     Findings: No rash.  Neurological:     General: No focal deficit present.     Mental Status: She is alert and oriented to person, place, and time.     Sensory: Sensation is intact.     Motor: Motor function is intact.     Coordination: Coordination is intact.     Gait: Gait abnormal (antalgic, using cane).     Deep Tendon Reflexes: Reflexes are normal and symmetric.  Psychiatric:         Attention and Perception: Attention and perception normal.        Mood and Affect: Mood and affect normal.        Speech: Speech normal.        Behavior: Behavior normal. Behavior is cooperative.        Thought Content: Thought content normal.        Cognition and Memory: Cognition and memory normal.        Judgment: Judgment normal.      Results for orders placed or performed in visit on 09/07/23  CBC with Differential/Platelet   Collection Time: 09/07/23 11:29 AM  Result Value Ref Range   WBC 7.4 3.4 - 10.8 x10E3/uL   RBC 3.95 3.77 - 5.28 x10E6/uL   Hemoglobin 12.9 11.1 - 15.9 g/dL   Hematocrit 16.1 09.6 - 46.6 %   MCV 98 (H) 79 - 97 fL   MCH 32.7 26.6 - 33.0 pg   MCHC 33.5 31.5 - 35.7 g/dL   RDW 04.5 40.9 - 81.1 %   Platelets 310 150 - 450 x10E3/uL   Neutrophils 62 Not Estab. %   Lymphs 29 Not Estab. %   Monocytes 6 Not Estab. %   Eos 3 Not Estab. %   Basos 0 Not Estab. %   Neutrophils Absolute 4.5 1.4 - 7.0 x10E3/uL   Lymphocytes Absolute 2.2 0.7 - 3.1 x10E3/uL   Monocytes Absolute 0.5 0.1 - 0.9 x10E3/uL   EOS (ABSOLUTE) 0.2 0.0 - 0.4 x10E3/uL   Basophils Absolute 0.0 0.0 - 0.2 x10E3/uL   Immature Granulocytes 0 Not Estab. %   Immature Grans (Abs) 0.0 0.0 - 0.1 x10E3/uL  CMP14+EGFR   Collection Time: 09/07/23 11:29 AM  Result Value Ref Range   Glucose 91 70 - 99 mg/dL   BUN 18 8 - 27 mg/dL   Creatinine, Ser 9.14 0.57 - 1.00 mg/dL   eGFR 66 >78 GN/FAO/1.30   BUN/Creatinine Ratio 20 12 - 28   Sodium 143 134 - 144 mmol/L   Potassium 4.7 3.5 - 5.2 mmol/L   Chloride 107 (H) 96 - 106 mmol/L   CO2 22 20 - 29 mmol/L   Calcium  9.5 8.7 - 10.3 mg/dL   Total Protein 6.5 6.0 - 8.5 g/dL   Albumin  4.4 3.8 - 4.8 g/dL   Globulin, Total 2.1 1.5 - 4.5 g/dL   Bilirubin Total 0.5 0.0 - 1.2 mg/dL   Alkaline Phosphatase 59 44 - 121 IU/L   AST 23 0 - 40 IU/L   ALT 22 0 - 32 IU/L  Lipid panel   Collection Time: 09/07/23 11:29 AM  Result Value Ref Range  Cholesterol, Total 153  100 - 199 mg/dL   Triglycerides 60 0 - 149 mg/dL   HDL 89 >16 mg/dL   VLDL Cholesterol Cal 12 5 - 40 mg/dL   LDL Chol Calc (NIH) 52 0 - 99 mg/dL   Chol/HDL Ratio 1.7 0.0 - 4.4 ratio  Thyroid  Panel With TSH   Collection Time: 09/07/23 11:29 AM  Result Value Ref Range   TSH 0.673 0.450 - 4.500 uIU/mL   T4, Total 6.1 4.5 - 12.0 ug/dL   T3 Uptake Ratio 30 24 - 39 %   Free Thyroxine Index 1.8 1.2 - 4.9  VITAMIN D  25 Hydroxy (Vit-D Deficiency, Fractures)   Collection Time: 09/07/23 11:29 AM  Result Value Ref Range   Vit D, 25-Hydroxy 32.0 30.0 - 100.0 ng/mL       Pertinent labs & imaging results that were available during my care of the patient were reviewed by me and considered in my medical decision making.  Assessment & Plan:  Aala was seen today for cough and nasal congestion.  Diagnoses and all orders for this visit:  URI with cough and congestion -     predniSONE  (DELTASONE ) 20 MG tablet; Take 2 tablets (40 mg total) by mouth daily with breakfast for 5 days. -     albuterol (VENTOLIN HFA) 108 (90 Base) MCG/ACT inhaler; Inhale 2 puffs into the lungs every 6 (six) hours as needed for wheezing or shortness of breath. -     amoxicillin -clavulanate (AUGMENTIN ) 875-125 MG tablet; Take 1 tablet by mouth 2 (two) times daily for 7 days.  Fever in adult -     predniSONE  (DELTASONE ) 20 MG tablet; Take 2 tablets (40 mg total) by mouth daily with breakfast for 5 days. -     albuterol (VENTOLIN HFA) 108 (90 Base) MCG/ACT inhaler; Inhale 2 puffs into the lungs every 6 (six) hours as needed for wheezing or shortness of breath. -     amoxicillin -clavulanate (AUGMENTIN ) 875-125 MG tablet; Take 1 tablet by mouth 2 (two) times daily for 7 days.  Wheezing -     predniSONE  (DELTASONE ) 20 MG tablet; Take 2 tablets (40 mg total) by mouth daily with breakfast for 5 days. -     albuterol (VENTOLIN HFA) 108 (90 Base) MCG/ACT inhaler; Inhale 2 puffs into the lungs every 6 (six) hours as needed for  wheezing or shortness of breath. -     amoxicillin -clavulanate (AUGMENTIN ) 875-125 MG tablet; Take 1 tablet by mouth 2 (two) times daily for 7 days.    Cough and congestion with symptoms persisting for one week, including severe cough, chest congestion, and fever. Wheezing present on examination. No sputum production, ear pain, or other systemic symptoms. Antibiotics indicated due to symptom duration and fever. Prednisone  prescribed for wheezing and airway inflammation. Albuterol inhaler recommended for cough and congestion. Augmentin  selected for its efficacy in bacterial bronchitis. - Prescribe Augmentin  for 7 days. - Prescribe prednisone  to reduce airway inflammation. - Prescribe albuterol inhaler for cough and congestion relief.         Continue all other maintenance medications.  Follow up plan: Return if symptoms worsen or fail to improve.   The above assessment and management plan was discussed with the patient. The patient verbalized understanding of and has agreed to the management plan. Patient is aware to call the clinic if they develop any new symptoms or if symptoms persist or worsen. Patient is aware when to return to the clinic for a follow-up visit. Patient educated  on when it is appropriate to go to the emergency department.   Kattie Parrot, FNP-C [facility] [phone]

## 2023-10-18 ENCOUNTER — Other Ambulatory Visit: Payer: Self-pay | Admitting: Nurse Practitioner

## 2023-10-18 DIAGNOSIS — I10 Essential (primary) hypertension: Secondary | ICD-10-CM

## 2023-10-19 DIAGNOSIS — M7062 Trochanteric bursitis, left hip: Secondary | ICD-10-CM | POA: Diagnosis not present

## 2023-10-19 DIAGNOSIS — M7061 Trochanteric bursitis, right hip: Secondary | ICD-10-CM | POA: Diagnosis not present

## 2023-11-07 ENCOUNTER — Ambulatory Visit: Admitting: Nurse Practitioner

## 2023-11-13 ENCOUNTER — Other Ambulatory Visit: Payer: Self-pay | Admitting: Nurse Practitioner

## 2023-11-13 NOTE — Telephone Encounter (Signed)
 Last OV 09/28/23. Last RF 09/01/23. Next OV 11/20/23

## 2023-11-20 ENCOUNTER — Ambulatory Visit: Admitting: Nurse Practitioner

## 2023-11-20 ENCOUNTER — Encounter: Payer: Self-pay | Admitting: Nurse Practitioner

## 2023-11-20 VITALS — BP 140/78 | HR 76 | Temp 97.2°F | Ht 61.0 in | Wt 171.0 lb

## 2023-11-20 DIAGNOSIS — S32009K Unspecified fracture of unspecified lumbar vertebra, subsequent encounter for fracture with nonunion: Secondary | ICD-10-CM | POA: Diagnosis not present

## 2023-11-20 DIAGNOSIS — G629 Polyneuropathy, unspecified: Secondary | ICD-10-CM

## 2023-11-20 DIAGNOSIS — K219 Gastro-esophageal reflux disease without esophagitis: Secondary | ICD-10-CM | POA: Diagnosis not present

## 2023-11-20 DIAGNOSIS — E785 Hyperlipidemia, unspecified: Secondary | ICD-10-CM

## 2023-11-20 DIAGNOSIS — I10 Essential (primary) hypertension: Secondary | ICD-10-CM

## 2023-11-20 DIAGNOSIS — E669 Obesity, unspecified: Secondary | ICD-10-CM | POA: Diagnosis not present

## 2023-11-20 DIAGNOSIS — F3341 Major depressive disorder, recurrent, in partial remission: Secondary | ICD-10-CM

## 2023-11-20 MED ORDER — OXYCODONE-ACETAMINOPHEN 7.5-325 MG PO TABS: 1.0000 | ORAL_TABLET | Freq: Three times a day (TID) | ORAL | 0 refills | Status: AC | PRN

## 2023-11-20 MED ORDER — NYSTATIN 100000 UNIT/GM EX POWD: 1.0000 | Freq: Three times a day (TID) | CUTANEOUS | 2 refills | Status: AC

## 2023-11-20 MED ORDER — OXYCODONE-ACETAMINOPHEN 7.5-325 MG PO TABS: 1.0000 | ORAL_TABLET | Freq: Three times a day (TID) | ORAL | 0 refills | Status: DC | PRN

## 2023-11-20 NOTE — Patient Instructions (Signed)
 Fall Prevention in the Home, Adult Falls can cause injuries and can happen to people of all ages. There are many things you can do to make your home safer and to help prevent falls. What actions can I take to prevent falls? General information Use good lighting in all rooms. Make sure to: Replace any light bulbs that burn out. Turn on the lights in dark areas and use night-lights. Keep items that you use often in easy-to-reach places. Lower the shelves around your home if needed. Move furniture so that there are clear paths around it. Do not use throw rugs or other things on the floor that can make you trip. If any of your floors are uneven, fix them. Add color or contrast paint or tape to clearly mark and help you see: Grab bars or handrails. First and last steps of staircases. Where the edge of each step is. If you use a ladder or stepladder: Make sure that it is fully opened. Do not climb a closed ladder. Make sure the sides of the ladder are locked in place. Have someone hold the ladder while you use it. Know where your pets are as you move through your home. What can I do in the bathroom?     Keep the floor dry. Clean up any water on the floor right away. Remove soap buildup in the bathtub or shower. Buildup makes bathtubs and showers slippery. Use non-skid mats or decals on the floor of the bathtub or shower. Attach bath mats securely with double-sided, non-slip rug tape. If you need to sit down in the shower, use a non-slip stool. Install grab bars by the toilet and in the bathtub and shower. Do not use towel bars as grab bars. What can I do in the bedroom? Make sure that you have a light by your bed that is easy to reach. Do not use any sheets or blankets on your bed that hang to the floor. Have a firm chair or bench with side arms that you can use for support when you get dressed. What can I do in the kitchen? Clean up any spills right away. If you need to reach something  above you, use a step stool with a grab bar. Keep electrical cords out of the way. Do not use floor polish or wax that makes floors slippery. What can I do with my stairs? Do not leave anything on the stairs. Make sure that you have a light switch at the top and the bottom of the stairs. Make sure that there are handrails on both sides of the stairs. Fix handrails that are broken or loose. Install non-slip stair treads on all your stairs if they do not have carpet. Avoid having throw rugs at the top or bottom of the stairs. Choose a carpet that does not hide the edge of the steps on the stairs. Make sure that the carpet is firmly attached to the stairs. Fix carpet that is loose or worn. What can I do on the outside of my home? Use bright outdoor lighting. Fix the edges of walkways and driveways and fix any cracks. Clear paths of anything that can make you trip, such as tools or rocks. Add color or contrast paint or tape to clearly mark and help you see anything that might make you trip as you walk through a door, such as a raised step or threshold. Trim any bushes or trees on paths to your home. Check to see if handrails are loose  or broken and that both sides of all steps have handrails. Install guardrails along the edges of any raised decks and porches. Have leaves, snow, or ice cleared regularly. Use sand, salt, or ice melter on paths if you live where there is ice and snow during the winter. Clean up any spills in your garage right away. This includes grease or oil spills. What other actions can I take? Review your medicines with your doctor. Some medicines can cause dizziness or changes in blood pressure, which increase your risk of falling. Wear shoes that: Have a low heel. Do not wear high heels. Have rubber bottoms and are closed at the toe. Feel good on your feet and fit well. Use tools that help you move around if needed. These include: Canes. Walkers. Scooters. Crutches. Ask  your doctor what else you can do to help prevent falls. This may include seeing a physical therapist to learn to do exercises to move better and get stronger. Where to find more information Centers for Disease Control and Prevention, [employer]: TonerPromos.no [employer] on Aging: BaseRingTones.pl National Institute on Aging: BaseRingTones.pl Contact a doctor if: You are afraid of falling at home. You feel weak, drowsy, or dizzy at home. You fall at home. Get help right away if you: Lose consciousness or have trouble moving after a fall. Have a fall that causes a head injury. These symptoms may be an emergency. Get help right away. Call 911. Do not wait to see if the symptoms will go away. Do not drive yourself to the hospital. This information is not intended to replace advice given to you by your health care provider. Make sure you discuss any questions you have with your health care provider. Document Revised: 12/27/2021 Document Reviewed: 12/27/2021 Elsevier Patient Education  2024 [employer].

## 2023-11-20 NOTE — Progress Notes (Signed)
 Subjective:    Patient ID: [name], female    DOB: Sep 06, 1945, 78 y.o.   MRN: [id number]   Chief Complaint: medical management of chronic issues     HPI:  [name] is a 78 y.o. who identifies as a female who was assigned female at birth.   Social history: Lives with: by herself Work history: retired   Water engineer in today for follow up of the following chronic medical issues:  1. Primary hypertension No c/o chest pain, sob or headache. Does not check blood pressure at home. BP Readings from Last 3 Encounters:  09/28/23 129/70  09/20/23 138/78  09/07/23 (!) 165/72     2. Hyperlipidemia LDL goal <70 Does not really watch diet and does no dedicated exercise. Lab Results  Component Value Date   CHOL 153 09/07/2023   HDL 89 09/07/2023   LDLCALC 52 09/07/2023   TRIG 60 09/07/2023   CHOLHDL 1.7 09/07/2023     3. Peripheral polyneuropathy Has numbness in bil feet at times  4. Peripheral edema Has some swelling in ankles by the endof the day. Herlasix works well for her.  5. GERD Takes protonix  daily and is doing well  6. Pseudoarthrosis L3- L5 Pain assessment: Cause of pain- pseudoarthrosis Pain location- lower back Pain on scale of 1-10- 5-6/10 Frequency- daily What increases pain-nothing really What makes pain Better-pain meds help Effects on ADL - none Any change in general medical condition-none  Current opioids rx- percocet 7.5/325-  # meds rx- 90 Effectiveness of current meds-helps when needed Adverse reactions from pain meds-none Morphine  equivalent- 33.75MME  Pill count performed-No Last drug screen - 06/13/22 ( high risk q89m, moderate risk q85m, low risk yearly ) Urine drug screen today- No Was the [facility] reviewed- yes  If yes were their any concerning findings? - no   Overdose risk: 1    09/15/2020   10:41 AM  Opioid Risk   Alcohol 0  Illegal Drugs 0  Rx Drugs 0  Alcohol 0  Illegal Drugs 0  Rx Drugs 0  Age between 16-45  years  0  History of Preadolescent Sexual Abuse 0  Psychological Disease 0  Depression 0  Opioid Risk Tool Scoring 0  Opioid Risk Interpretation Low Risk     Pain contract signed on:06/15/22   6. Depression    11/20/2023    8:36 AM 11/20/2023    8:35 AM 09/07/2023   10:45 AM  Depression screen PHQ 2/9  Decreased Interest 0 0 0  Down, Depressed, Hopeless 0 0 0  PHQ - 2 Score 0 0 0  Altered sleeping 0    Tired, decreased energy 1    Change in appetite 0    Feeling bad or failure about yourself  0    Trouble concentrating 0    Moving slowly or fidgety/restless 0    Suicidal thoughts 0    PHQ-9 Score 1    Difficult doing work/chores Not difficult at all         7. Obesity (BMI 30-39.9) Weight is down 4 lbs Wt Readings from Last 3 Encounters:  09/28/23 173 lb 6.4 oz (78.7 kg)  09/20/23 177 lb (80.3 kg)  09/07/23 174 lb (78.9 kg)   BMI Readings from Last 3 Encounters:  09/28/23 32.76 kg/m  09/20/23 34.57 kg/m  09/07/23 32.88 kg/m      New complaints: None today  Allergies  Allergen Reactions   Cashew Nut Oil Hives   Dog Epithelium Hives  and Itching   Sulfa Antibiotics Other (See Comments)    blistering lips and mouth sores   Outpatient Encounter Medications as of 11/20/2023  Medication Sig   albuterol  (VENTOLIN  HFA) 108 (90 Base) MCG/ACT inhaler Inhale 2 puffs into the lungs every 6 (six) hours as needed for wheezing or shortness of breath.   atorvastatin  (LIPITOR) 40 MG tablet Take 1 tablet (40 mg total) by mouth daily.   conjugated estrogens  (PREMARIN ) vaginal cream Apply 0.5 mg intravaginally once daily x 2 weeks and then twice a week   furosemide  (LASIX ) 20 MG tablet Take 1 tablet (20 mg total) by mouth daily.   lisinopril  (ZESTRIL ) 20 MG tablet Take 1 tablet by mouth once daily   meloxicam  (MOBIC ) 7.5 MG tablet Take 1 tablet by mouth once daily   methocarbamol  (ROBAXIN ) 500 MG tablet Take 1 tablet (500 mg total) by mouth at bedtime.   mupirocin   ointment (BACTROBAN ) 2 % Apply 1 Application topically as needed (for iritation).   nystatin  cream (MYCOSTATIN ) APPLY TOPICALLY AS NEEDED FOR DRY SKIN   oxyCODONE -acetaminophen  (PERCOCET) 7.5-325 MG tablet Take 1 tablet by mouth every 8 (eight) hours as needed for severe pain (pain score 7-10).   pantoprazole  (PROTONIX ) 40 MG tablet TAKE 1 TABLET BY MOUTH ONCE DAILY BEFORE SUPPER FOR STOMACH PAIN   tizanidine  (ZANAFLEX ) 2 MG capsule TAKE 1 CAPSULE BY MOUTH THREE TIMES DAILY   No facility-administered encounter medications on file as of 11/20/2023.    Past Surgical History:  Procedure Laterality Date   ABDOMINAL HYSTERECTOMY  11/02/10  dr dodie  @[facility]    TAH, BSO, incisional hernia repair , lysis of adhesions for endometrial cancer   ANTERIOR LUMBAR FUSION  01-28-2008   dr elsner  @[facility]    L2 -- L4   BACK SURGERY     BUNIONECTOMY Right 2003   CATARACT EXTRACTION W/ INTRAOCULAR LENS  IMPLANT, BILATERAL  2007   COLONOSCOPY  last one 12-24-2018   CYSTO/  LEFT RETROGRADE PYELOGRAM/ URETEROSCOPY STENT PLACEMENT/  OPEN URETEROLYSIS WITH OMENTAL FLAP Left 03-11-2009    dr borden  @[facility]    CYSTO/  [facility] SLING/  ANTERIOR REPAIR  12-06-2001    dr watt @[facility]    CYSTOSCOPY W/ URETERAL STENT PLACEMENT Left 12/07/2017   Procedure: CYSTOSCOPY WITH RETROGRADE PYELOGRAM/URETERAL STENT PLACEMENT;  Surgeon: Renda Glance, MD;  Location: [facility];  Service: Urology;  Laterality: Left;   CYSTOSCOPY W/ URETERAL STENT PLACEMENT Left 03/29/2018   Procedure: CYSTOSCOPY WITH STENT  EXCHANGE;  Surgeon: Renda Glance, MD;  Location: [facility];  Service: Urology;  Laterality: Left;   CYSTOSCOPY WITH RETROGRADE PYELOGRAM, URETEROSCOPY AND STENT PLACEMENT Left 12-29-2008   dr renda  @[facility]    WITH BALLOON DILATION LEFT URETERAL STRICTURE   CYSTOSCOPY WITH RETROGRADE PYELOGRAM, URETEROSCOPY AND STENT PLACEMENT Left 09-12-2008    dr watt @[facility]    CYSTOSCOPY WITH RETROGRADE PYELOGRAM, URETEROSCOPY AND STENT PLACEMENT Left 03/23/2020    Procedure: CYSTOSCOPY WITH LEFT RETROGRADE PYELOGRAM, AND LEFT STENT REMOVAL;  Surgeon: Renda Glance, MD;  Location: [facility];  Service: Urology;  Laterality: Left;   CYSTOSCOPY WITH STENT PLACEMENT Left 07/26/2018   Procedure: CYSTOSCOPY WITH STENT CHANGE;  Surgeon: Renda Glance, MD;  Location: [facility];  Service: Urology;  Laterality: Left;   CYSTOSCOPY WITH STENT PLACEMENT Left 12/03/2018   Procedure: CYSTOSCOPY WITH STENT CHANGE;  Surgeon: Renda Glance, MD;  Location: [facility];  Service: Urology;  Laterality: Left;   CYSTOSCOPY WITH STENT PLACEMENT Left 04/22/2019   Procedure: CYSTOSCOPY WITH STENT CHANGE;  Surgeon: Renda Glance, MD;  [facility];  Service: Urology;  Laterality: Left;   CYSTOSCOPY WITH STENT PLACEMENT Left 10/03/2019   Procedure: CYSTOSCOPY WITH STENT CHANGE;  Surgeon: Renda Glance, MD;  Location: [facility];  Service: Urology;  Laterality: Left;   EYE SURGERY     FOOT SURGERY Left 06-23-2010   dr vickye   left first and second toes   INSERTION OF MESH N/A 04/23/2013   Procedure: INSERTION OF MESH;  Surgeon: Elspeth KYM Schultze, MD;  Location: [facility];  Service: General;  Laterality: N/A;   JOINT REPLACEMENT     KNEE ARTHROSCOPY Right 04-27-2007  dr amy @[facility]    LAPAROSCOPIC LYSIS OF ADHESIONS N/A 04/23/2013   Procedure: LAPAROSCOPIC LYSIS OF ADHESIONS;  Surgeon: Elspeth KYM Schultze, MD;  Location: [facility];  Service: General;  Laterality: N/A;   LUMBAR SPINE SURGERY  1989 and 1999   POSTERIOR LUMBAR FUSION  12/17/2012   L3 -- L5 laminectomy and L3 -- 5 fusion   TOTAL HIP ARTHROPLASTY Left 07/04/2014   Procedure: LEFT TOTAL HIP ARTHROPLASTY ANTERIOR APPROACH;  Surgeon: Dempsey Melodi GAILS, MD;  Location: [facility];  Service: Orthopedics;  Laterality: Left;   TOTAL HIP ARTHROPLASTY Right 2009   TOTAL KNEE ARTHROPLASTY Right 09-15-2009   dr amy @[facility]    VENTRAL HERNIA REPAIR N/A 07/19/2012   Procedure: LAPAROSCOPIC LYSIS OF ADHESIONS, SMALL BOWEL RESECTION,  SEROSAL REPAIR, PRIMARY VENTRAL HERNIA REPAIR;  Surgeon: Elspeth KYM Schultze, MD;  Location: [facility];  Service: General;  Laterality: N/A;   VENTRAL HERNIA REPAIR N/A 04/23/2013   Procedure: LAPAROSCOPIC exploration and repair of hernia in abdominal VENTRAL wall  HERNIA;  Surgeon: Elspeth KYM Schultze, MD;  Location: [facility];  Service: General;  Laterality: N/A;    Family History  Problem Relation Age of Onset   Diabetes Mother    Lung cancer Mother    Diabetes Father    Liver cancer Father    Colon cancer Sister        close to 13 per pt   Diabetes Sister    Diabetes Sister    Hypertension Sister    Kidney disease Sister    Seizures Daughter    Diabetes Son    Breast cancer Neg Hx    Allergic rhinitis Neg Hx    Angioedema Neg Hx    Asthma Neg Hx    Atopy Neg Hx    Eczema Neg Hx    Immunodeficiency Neg Hx    Urticaria Neg Hx    Colon polyps Neg Hx    Esophageal cancer Neg Hx    Rectal cancer Neg Hx    Stomach cancer Neg Hx        Review of Systems  Constitutional:  Negative for diaphoresis.  Eyes:  Negative for pain.  Respiratory:  Negative for shortness of breath.   Cardiovascular:  Negative for chest pain, palpitations and leg swelling.  Gastrointestinal:  Negative for abdominal pain.  Endocrine: Negative for polydipsia.  Skin:  Negative for rash.  Neurological:  Negative for dizziness, weakness and headaches.  Hematological:  Does not bruise/bleed easily.  All other systems reviewed and are negative.      Objective:   Physical Exam Vitals and nursing note reviewed.  Constitutional:      General: She is not in acute distress.    Appearance: Normal appearance. She is well-developed.  HENT:     Head: Normocephalic.     Right Ear: Tympanic membrane normal.  Left Ear: Tympanic membrane normal.     Nose: Nose normal.     Mouth/Throat:     Mouth: Mucous membranes are moist.  Eyes:     Pupils: Pupils are equal, round, and reactive to light.  Neck:     Vascular: No  carotid bruit or JVD.  Cardiovascular:     Rate and Rhythm: Normal rate and regular rhythm.     Heart sounds: Normal heart sounds.  Pulmonary:     Effort: Pulmonary effort is normal. No respiratory distress.     Breath sounds: Normal breath sounds. No wheezing or rales.  Chest:     Chest wall: No tenderness.  Abdominal:     General: Bowel sounds are normal. There is no distension or abdominal bruit.     Palpations: Abdomen is soft. There is no hepatomegaly, splenomegaly, mass or pulsatile mass.     Tenderness: There is no abdominal tenderness.  Musculoskeletal:        General: Normal range of motion.     Cervical back: Normal range of motion and neck supple.     Right lower leg: Edema (1+) present.     Left lower leg: Edema (1+) present.  Lymphadenopathy:     Cervical: No cervical adenopathy.  Skin:    General: Skin is warm and dry.  Neurological:     Mental Status: She is alert and oriented to person, place, and time.     Deep Tendon Reflexes: Reflexes are normal and symmetric.  Psychiatric:        Behavior: Behavior normal.        Thought Content: Thought content normal.        Judgment: Judgment normal.    BP (!) 150/80   Pulse 76   Temp (!) 97.2 F (36.2 C) (Temporal)   Ht 5' 1 (1.549 m)   Wt 171 lb (77.6 kg)   SpO2 96%   BMI 32.31 kg/m          Assessment & Plan:   SWAYZE KOZUCH comes in today with chief complaint of medical management of chronic issues    Diagnosis and orders addressed:  1. Primary hypertension Low sodium diet - CBC with Differential/Platelet - CMP14+EGFR  2. Hyperlipidemia LDL goal <70 Low fat diet - Lipid panel - atorvastatin  (LIPITOR) 40 MG tablet; Take 1 tablet (40 mg total) by mouth daily.  Dispense: 90 tablet; Refill: 1  3. Peripheral polyneuropathy Do not go barefooted  4. Peripheral edema Elevate legs when sitting  5. Pseudoarthrosis L3- L5 Back stretches Pain meds filled - meloxicam  (MOBIC ) 7.5 MG tablet;  Take 1 tablet (7.5 mg total) by mouth daily.  Dispense: 90 tablet; Refill: 0  6. Obesity (BMI 30-39.9) Discussed diet and exercise for person with BMI >25 Will recheck weight in 3-6 months    Labs pending Health Maintenance reviewed Diet and exercise encouraged  Follow up plan: 3 months pain management   Mary-Margaret Gladis, FNP

## 2023-11-28 DIAGNOSIS — E669 Obesity, unspecified: Secondary | ICD-10-CM | POA: Diagnosis not present

## 2023-11-28 DIAGNOSIS — Z6831 Body mass index (BMI) 31.0-31.9, adult: Secondary | ICD-10-CM | POA: Diagnosis not present

## 2023-11-28 DIAGNOSIS — E785 Hyperlipidemia, unspecified: Secondary | ICD-10-CM | POA: Diagnosis not present

## 2023-11-28 DIAGNOSIS — Z008 Encounter for other general examination: Secondary | ICD-10-CM | POA: Diagnosis not present

## 2023-11-28 DIAGNOSIS — I10 Essential (primary) hypertension: Secondary | ICD-10-CM | POA: Diagnosis not present

## 2023-12-08 ENCOUNTER — Ambulatory Visit: Admitting: Nurse Practitioner

## 2023-12-15 ENCOUNTER — Encounter: Payer: Self-pay | Admitting: Family Medicine

## 2023-12-15 ENCOUNTER — Other Ambulatory Visit

## 2023-12-15 ENCOUNTER — Ambulatory Visit: Admitting: Family Medicine

## 2023-12-15 VITALS — BP 141/72 | HR 89 | Temp 98.0°F | Ht 61.0 in | Wt 169.2 lb

## 2023-12-15 DIAGNOSIS — I10 Essential (primary) hypertension: Secondary | ICD-10-CM

## 2023-12-15 DIAGNOSIS — Z79890 Hormone replacement therapy (postmenopausal): Secondary | ICD-10-CM

## 2023-12-15 DIAGNOSIS — Z8542 Personal history of malignant neoplasm of other parts of uterus: Secondary | ICD-10-CM

## 2023-12-15 DIAGNOSIS — N3001 Acute cystitis with hematuria: Secondary | ICD-10-CM | POA: Diagnosis not present

## 2023-12-15 DIAGNOSIS — R61 Generalized hyperhidrosis: Secondary | ICD-10-CM

## 2023-12-15 LAB — MICROSCOPIC EXAMINATION
Epithelial Cells (non renal): NONE SEEN /HPF (ref 0–10)
Renal Epithel, UA: NONE SEEN /HPF
WBC, UA: >30 /HPF — AB (ref 0–5)
Yeast, UA: NONE SEEN

## 2023-12-15 LAB — URINALYSIS, ROUTINE W REFLEX MICROSCOPIC
Bilirubin, UA: NEGATIVE
Glucose, UA: NEGATIVE
Ketones, UA: NEGATIVE
Nitrite, UA: POSITIVE — AB
Specific Gravity, UA: 1.015 (ref 1.005–1.030)
Urobilinogen, Ur: 0.2 mg/dL (ref 0.2–1.0)
pH, UA: 6 (ref 5.0–7.5)

## 2023-12-15 MED ORDER — CEPHALEXIN 500 MG PO CAPS: 500.0000 mg | ORAL_CAPSULE | Freq: Two times a day (BID) | ORAL | 0 refills | Status: AC

## 2023-12-15 NOTE — Progress Notes (Signed)
 Subjective:  Patient ID: [name], female    DOB: 08-11-1945, 78 y.o.   MRN: [id number]  Patient Care Team: Gladis Mustard, FNP as PCP - General (Family Medicine) Dodie Shadow, MD as Consulting Physician (Gynecologic Oncology) Renda Glance, MD as Consulting Physician (Urology) Letha Cancer, MD as Consulting Physician (Pain Medicine) Legrand Victory LITTIE MOULD, MD as Consulting Physician (Gastroenterology) Sheldon Standing, MD as Consulting Physician (General Surgery) Overton Constance DASEN, MD as Consulting Physician (Infectious Diseases) Regenia Prentice Clack, MD as Consulting Physician (Ophthalmology)   Chief Complaint:  Excessive Sweating   HPI: [name] is a 78 y.o. female presenting on 12/15/2023 for Excessive Sweating  [name] is a 78 year old female who presents with excessive sweating upon activity.  She has been experiencing excessive sweating for the past two weeks, primarily during activities such as walking or standing. The sweating is described as 'soaking wet' and ceases when she sits down and relaxes. It is generalized and not localized to any specific area. No associated chest pain, shortness of breath, palpitations, dizziness, or changes in vision. No significant night sweats are reported.  Her past medical history includes endometrial cancer for which she underwent a hysterectomy. She has been using Premarin  cream for a long time for vaginal dryness. She also has a history of recurrent urinary tract infections (UTIs) since 2009, following a back surgery that affected her left kidney. She is currently under the care of an infectious disease doctor for her UTIs.  During the review of symptoms, she denies any history of heart disease, pacemaker, or defibrillator. She has not checked her blood pressure or blood sugar during the episodes of sweating. No unexplained weight loss, coughing, or issues with bowel movements. No difficulty with urination.           Relevant past medical, surgical, family, and social history reviewed and updated as indicated.  Allergies and medications reviewed and updated. Data reviewed: Chart in Epic.   Past Medical History:  Diagnosis Date   Arthritis    Bacteriuria 07/10/2023   Cancer (HCC)    ovarian,skin Ca. in mouth   Cataract    Bil cataracts removed   Chronic constipation    Chronic kidney disease    Chronic midline low back pain with sciatica    Colon polyp    large ascending colon polyp , scheduled for resection 02/ 2021   Full dentures    History of endometrial cancer 2012   s/p   TAH w/ BSO 11-02-2010   History of recurrent UTIs    History of sepsis    multiple times (some due to e.coli) last sepsis 07/ 2019   History of small bowel obstruction    x2  ,  hx bowel resection   HOH (hard of hearing)    Hyperlipidemia    Hypertension    Lower urinary tract symptoms (LUTS) 07/10/2023   Retroperitoneal fibrosis    Substance abuse (HCC)    opiod dependancy   Ureteral obstruction, left urologist-- dr renda   secondary to adhesions/ retroperitoneal fibrosis, treated with ureteral stent   Vertigo    Wears glasses     Past Surgical History:  Procedure Laterality Date   ABDOMINAL HYSTERECTOMY  11/02/10  dr dodie  @[facility]    TAH, BSO, incisional hernia repair , lysis of adhesions for endometrial cancer   ANTERIOR LUMBAR FUSION  01-28-2008   dr elsner  @[facility]    L2 -- L4   BACK SURGERY  BUNIONECTOMY Right 2003   CATARACT EXTRACTION W/ INTRAOCULAR LENS  IMPLANT, BILATERAL  2007   COLONOSCOPY  last one 12-24-2018   CYSTO/  LEFT RETROGRADE PYELOGRAM/ URETEROSCOPY STENT PLACEMENT/  OPEN URETEROLYSIS WITH OMENTAL FLAP Left 03-11-2009    dr borden  @[facility]    CYSTO/  [facility]/  ANTERIOR REPAIR  12-06-2001    dr watt @[facility]    CYSTOSCOPY W/ URETERAL STENT PLACEMENT Left 12/07/2017   Procedure: CYSTOSCOPY WITH RETROGRADE PYELOGRAM/URETERAL STENT PLACEMENT;  Surgeon: Renda Glance, MD;  Location: [facility];   Service: Urology;  Laterality: Left;   CYSTOSCOPY W/ URETERAL STENT PLACEMENT Left 03/29/2018   Procedure: CYSTOSCOPY WITH STENT  EXCHANGE;  Surgeon: Renda Glance, MD;  Location: [facility];  Service: Urology;  Laterality: Left;   CYSTOSCOPY WITH RETROGRADE PYELOGRAM, URETEROSCOPY AND STENT PLACEMENT Left 12-29-2008   dr renda  @[facility]    WITH BALLOON DILATION LEFT URETERAL STRICTURE   CYSTOSCOPY WITH RETROGRADE PYELOGRAM, URETEROSCOPY AND STENT PLACEMENT Left 09-12-2008    dr watt @[facility]    CYSTOSCOPY WITH RETROGRADE PYELOGRAM, URETEROSCOPY AND STENT PLACEMENT Left 03/23/2020   Procedure: CYSTOSCOPY WITH LEFT RETROGRADE PYELOGRAM, AND LEFT STENT REMOVAL;  Surgeon: Renda Glance, MD;  Location: [facility];  Service: Urology;  Laterality: Left;   CYSTOSCOPY WITH STENT PLACEMENT Left 07/26/2018   Procedure: CYSTOSCOPY WITH STENT CHANGE;  Surgeon: Renda Glance, MD;  Location: [facility];  Service: Urology;  Laterality: Left;   CYSTOSCOPY WITH STENT PLACEMENT Left 12/03/2018   Procedure: CYSTOSCOPY WITH STENT CHANGE;  Surgeon: Renda Glance, MD;  Location: [facility];  Service: Urology;  Laterality: Left;   CYSTOSCOPY WITH STENT PLACEMENT Left 04/22/2019   Procedure: CYSTOSCOPY WITH STENT CHANGE;  Surgeon: Renda Glance, MD;  Location: [facility];  Service: Urology;  Laterality: Left;   CYSTOSCOPY WITH STENT PLACEMENT Left 10/03/2019   Procedure: CYSTOSCOPY WITH STENT CHANGE;  Surgeon: Renda Glance, MD;  Location: [facility];  Service: Urology;  Laterality: Left;   EYE SURGERY     FOOT SURGERY Left 06-23-2010   dr vickye   left first and second toes   INSERTION OF MESH N/A 04/23/2013   Procedure: INSERTION OF MESH;  Surgeon: Elspeth KYM Schultze, MD;  Location: [facility];  Service: General;  Laterality: N/A;   JOINT REPLACEMENT     KNEE ARTHROSCOPY Right 04-27-2007  dr amy @[facility]    LAPAROSCOPIC LYSIS OF ADHESIONS N/A 04/23/2013   Procedure: LAPAROSCOPIC LYSIS OF ADHESIONS;  Surgeon: Elspeth KYM Schultze,  MD;  Location: [facility];  Service: General;  Laterality: N/A;   LUMBAR SPINE SURGERY  1989 and 1999   POSTERIOR LUMBAR FUSION  12/17/2012   L3 -- L5 laminectomy and L3 -- 5 fusion   TOTAL HIP ARTHROPLASTY Left 07/04/2014   Procedure: LEFT TOTAL HIP ARTHROPLASTY ANTERIOR APPROACH;  Surgeon: Dempsey Melodi GAILS, MD;  Location: [facility];  Service: Orthopedics;  Laterality: Left;   TOTAL HIP ARTHROPLASTY Right 2009   TOTAL KNEE ARTHROPLASTY Right 09-15-2009   dr amy @[facility]    VENTRAL HERNIA REPAIR N/A 07/19/2012   Procedure: LAPAROSCOPIC LYSIS OF ADHESIONS, SMALL BOWEL RESECTION, SEROSAL REPAIR, PRIMARY VENTRAL HERNIA REPAIR;  Surgeon: Elspeth KYM Schultze, MD;  Location: [facility];  Service: General;  Laterality: N/A;   VENTRAL HERNIA REPAIR N/A 04/23/2013   Procedure: LAPAROSCOPIC exploration and repair of hernia in abdominal VENTRAL wall  HERNIA;  Surgeon: Elspeth KYM Schultze, MD;  Location: [facility];  Service: General;  Laterality: N/A;    Social History   Socioeconomic History  Marital status: Single    Spouse name: Not on file   Number of children: 3   Years of education: 60   Highest education level: High school graduate  Occupational History   Occupation: Retired  Tobacco Use   Smoking status: Never   Smokeless tobacco: Never  Vaping Use   Vaping status: Never Used  Substance and Sexual Activity   Alcohol use: No   Drug use: No   Sexual activity: Not Currently    Birth control/protection: Surgical  Other Topics Concern   Not on file  Social History Narrative   Her son and DIL live with her   They help with bills   Social Drivers of Health   Financial Resource Strain: Low Risk  (06/28/2023)   Overall Financial Resource Strain (CARDIA)    Difficulty of Paying Living Expenses: Not hard at all  Food Insecurity: No Food Insecurity (06/28/2023)   Hunger Vital Sign    Worried About Running Out of Food in the Last Year: Never true    Ran Out of Food in the Last Year: Never true  Transportation  Needs: No Transportation Needs (06/28/2023)   PRAPARE - Administrator, Civil Service (Medical): No    Lack of Transportation (Non-Medical): No  Physical Activity: Insufficiently Active (06/28/2023)   Exercise Vital Sign    Days of Exercise per Week: 3 days    Minutes of Exercise per Session: 30 min  Stress: No Stress Concern Present (06/28/2023)   [employer] of Occupational Health - Occupational Stress Questionnaire    Feeling of Stress : Not at all  Social Connections: Moderately Isolated (06/28/2023)   Social Connection and Isolation Panel    Frequency of Communication with Friends and Family: More than three times a week    Frequency of Social Gatherings with Friends and Family: Three times a week    Attends Religious Services: More than 4 times per year    Active Member of Clubs or Organizations: No    Attends Banker Meetings: Never    Marital Status: Widowed  Intimate Partner Violence: Not At Risk (06/28/2023)   Humiliation, Afraid, Rape, and Kick questionnaire    Fear of Current or Ex-Partner: No    Emotionally Abused: No    Physically Abused: No    Sexually Abused: No    Outpatient Encounter Medications as of 12/15/2023  Medication Sig   albuterol  (VENTOLIN  HFA) 108 (90 Base) MCG/ACT inhaler Inhale 2 puffs into the lungs every 6 (six) hours as needed for wheezing or shortness of breath.   atorvastatin  (LIPITOR) 40 MG tablet Take 1 tablet (40 mg total) by mouth daily.   cephALEXin  (KEFLEX ) 500 MG capsule Take 1 capsule (500 mg total) by mouth 2 (two) times daily for 7 days.   conjugated estrogens  (PREMARIN ) vaginal cream Apply 0.5 mg intravaginally once daily x 2 weeks and then twice a week   furosemide  (LASIX ) 20 MG tablet Take 1 tablet (20 mg total) by mouth daily.   lisinopril  (ZESTRIL ) 20 MG tablet Take 1 tablet by mouth once daily   meloxicam  (MOBIC ) 7.5 MG tablet Take 1 tablet by mouth once daily   methocarbamol  (ROBAXIN ) 500 MG tablet Take 1  tablet (500 mg total) by mouth at bedtime.   mupirocin  ointment (BACTROBAN ) 2 % Apply 1 Application topically as needed (for iritation).   nystatin  (MYCOSTATIN /NYSTOP ) powder Apply 1 Application topically 3 (three) times daily.   nystatin  cream (MYCOSTATIN ) APPLY TOPICALLY AS NEEDED FOR  DRY SKIN   [START ON 02/05/2024] oxyCODONE -acetaminophen  (PERCOCET) 7.5-325 MG tablet Take 1 tablet by mouth every 8 (eight) hours as needed for severe pain (pain score 7-10).   [START ON 01/06/2024] oxyCODONE -acetaminophen  (PERCOCET) 7.5-325 MG tablet Take 1 tablet by mouth every 8 (eight) hours as needed for severe pain (pain score 7-10).   oxyCODONE -acetaminophen  (PERCOCET) 7.5-325 MG tablet Take 1 tablet by mouth every 8 (eight) hours as needed for severe pain (pain score 7-10).   pantoprazole  (PROTONIX ) 40 MG tablet TAKE 1 TABLET BY MOUTH ONCE DAILY BEFORE SUPPER FOR STOMACH PAIN   tizanidine  (ZANAFLEX ) 2 MG capsule TAKE 1 CAPSULE BY MOUTH THREE TIMES DAILY   No facility-administered encounter medications on file as of 12/15/2023.    Allergies  Allergen Reactions   Cashew Nut Oil Hives   Dog Epithelium Hives and Itching   Sulfa Antibiotics Other (See Comments)    blistering lips and mouth sores    Pertinent ROS per HPI, otherwise unremarkable      Objective:  BP (!) 141/72   Pulse 89   Temp 98 F (36.7 C) (Temporal)   Ht 5' 1 (1.549 m)   Wt 169 lb 3.2 oz (76.7 kg)   SpO2 100%   BMI 31.97 kg/m    Wt Readings from Last 3 Encounters:  12/15/23 169 lb 3.2 oz (76.7 kg)  11/20/23 171 lb (77.6 kg)  09/28/23 173 lb 6.4 oz (78.7 kg)    Physical Exam Vitals and nursing note reviewed.  Constitutional:      General: She is not in acute distress.    Appearance: Normal appearance. She is obese. She is not ill-appearing, toxic-appearing or diaphoretic.  HENT:     Head: Normocephalic and atraumatic.     Right Ear: Tympanic membrane, ear canal and external ear normal.     Left Ear: Tympanic  membrane, ear canal and external ear normal.     Nose: Nose normal.     Mouth/Throat:     Mouth: Mucous membranes are moist.     Pharynx: Oropharynx is clear. No oropharyngeal exudate or posterior oropharyngeal erythema.  Eyes:     Conjunctiva/sclera: Conjunctivae normal.     Pupils: Pupils are equal, round, and reactive to light.  Neck:     Vascular: No carotid bruit.  Cardiovascular:     Rate and Rhythm: Normal rate and regular rhythm.     Heart sounds: Normal heart sounds.  Pulmonary:     Effort: Pulmonary effort is normal.     Breath sounds: Normal breath sounds.  Abdominal:     General: Bowel sounds are normal.     Palpations: Abdomen is soft.     Tenderness: There is no abdominal tenderness. There is no right CVA tenderness or left CVA tenderness.  Musculoskeletal:     Cervical back: Neck supple. No tenderness.     Right lower leg: No edema.     Left lower leg: No edema.  Lymphadenopathy:     Cervical: No cervical adenopathy.  Skin:    General: Skin is warm and dry.     Capillary Refill: Capillary refill takes less than 2 seconds.  Neurological:     General: No focal deficit present.     Mental Status: She is alert and oriented to person, place, and time.     Gait: Gait abnormal (using cane).  Psychiatric:        Mood and Affect: Mood normal.        Behavior: Behavior normal.  Thought Content: Thought content normal.        Judgment: Judgment normal.      Results for orders placed or performed in visit on 09/07/23  CBC with Differential/Platelet   Collection Time: 09/07/23 11:29 AM  Result Value Ref Range   WBC 7.4 3.4 - 10.8 x10E3/uL   RBC 3.95 3.77 - 5.28 x10E6/uL   Hemoglobin 12.9 11.1 - 15.9 g/dL   Hematocrit 61.4 65.9 - 46.6 %   MCV 98 (H) 79 - 97 fL   MCH 32.7 26.6 - 33.0 pg   MCHC 33.5 31.5 - 35.7 g/dL   RDW 87.8 88.2 - 84.5 %   Platelets 310 150 - 450 x10E3/uL   Neutrophils 62 Not Estab. %   Lymphs 29 Not Estab. %   Monocytes 6 Not Estab. %    Eos 3 Not Estab. %   Basos 0 Not Estab. %   Neutrophils Absolute 4.5 1.4 - 7.0 x10E3/uL   Lymphocytes Absolute 2.2 0.7 - 3.1 x10E3/uL   Monocytes Absolute 0.5 0.1 - 0.9 x10E3/uL   EOS (ABSOLUTE) 0.2 0.0 - 0.4 x10E3/uL   Basophils Absolute 0.0 0.0 - 0.2 x10E3/uL   Immature Granulocytes 0 Not Estab. %   Immature Grans (Abs) 0.0 0.0 - 0.1 x10E3/uL  CMP14+EGFR   Collection Time: 09/07/23 11:29 AM  Result Value Ref Range   Glucose 91 70 - 99 mg/dL   BUN 18 8 - 27 mg/dL   Creatinine, Ser 9.10 0.57 - 1.00 mg/dL   eGFR 66 >40 fO/fpw/8.26   BUN/Creatinine Ratio 20 12 - 28   Sodium 143 134 - 144 mmol/L   Potassium 4.7 3.5 - 5.2 mmol/L   Chloride 107 (H) 96 - 106 mmol/L   CO2 22 20 - 29 mmol/L   Calcium  9.5 8.7 - 10.3 mg/dL   Total Protein 6.5 6.0 - 8.5 g/dL   Albumin  4.4 3.8 - 4.8 g/dL   Globulin, Total 2.1 1.5 - 4.5 g/dL   Bilirubin Total 0.5 0.0 - 1.2 mg/dL   Alkaline Phosphatase 59 44 - 121 IU/L   AST 23 0 - 40 IU/L   ALT 22 0 - 32 IU/L  Lipid panel   Collection Time: 09/07/23 11:29 AM  Result Value Ref Range   Cholesterol, Total 153 100 - 199 mg/dL   Triglycerides 60 0 - 149 mg/dL   HDL 89 >60 mg/dL   VLDL Cholesterol Cal 12 5 - 40 mg/dL   LDL Chol Calc (NIH) 52 0 - 99 mg/dL   Chol/HDL Ratio 1.7 0.0 - 4.4 ratio  Thyroid  Panel With TSH   Collection Time: 09/07/23 11:29 AM  Result Value Ref Range   TSH 0.673 0.450 - 4.500 uIU/mL   T4, Total 6.1 4.5 - 12.0 ug/dL   T3 Uptake Ratio 30 24 - 39 %   Free Thyroxine Index 1.8 1.2 - 4.9  VITAMIN D  25 Hydroxy (Vit-D Deficiency, Fractures)   Collection Time: 09/07/23 11:29 AM  Result Value Ref Range   Vit D, 25-Hydroxy 32.0 30.0 - 100.0 ng/mL       Pertinent labs & imaging results that were available during my care of the patient were reviewed by me and considered in my medical decision making.  Assessment & Plan:  [name] was seen today for excessive sweating.  Diagnoses and all orders for this visit:  Diaphoresis -      Hormone Panel -     Anemia Profile B -     CMP14+EGFR -  Thyroid  Panel With TSH -     LONG TERM MONITOR (3-14 DAYS); Future -     Urinalysis, Routine w reflex microscopic -     Urine Culture  Hormone replacement therapy (HRT) -     Hormone Panel -     Anemia Profile B -     CMP14+EGFR -     Thyroid  Panel With TSH -     LONG TERM MONITOR (3-14 DAYS); Future  Primary hypertension -     Hormone Panel -     Anemia Profile B -     CMP14+EGFR -     Thyroid  Panel With TSH -     LONG TERM MONITOR (3-14 DAYS); Future  History of endometrial cancer s/p TAH BSO June2013 -     Hormone Panel -     Anemia Profile B -     CMP14+EGFR -     Thyroid  Panel With TSH -     LONG TERM MONITOR (3-14 DAYS); Future  Acute cystitis with hematuria -     cephALEXin  (KEFLEX ) 500 MG capsule; Take 1 capsule (500 mg total) by mouth 2 (two) times daily for 7 days.        Generalized diaphoresis with activity Generalized diaphoresis with activity for two weeks, resolving with rest. No associated chest pain, shortness of breath, palpitations, dizziness, or vision changes. Differential diagnosis includes hormonal imbalances, thyroid  dysfunction, vasovagal symptoms, or other systemic issues. No history of heart disease or pacemaker. - Order heart monitor for two weeks to assess heart rate during episodes of diaphoresis - Order lab work including hormone levels, thyroid  function tests, blood counts, and liver function tests - Instruct her to keep a log of symptoms, including timing, associated activities, and dietary intake - Schedule follow-up appointment with Ronal Quant in a few weeks to review lab results and determine further testing if necessary - Obtain urine sample to check for current infection  Recurrent urinary tract infections Recurrent UTIs since 2009, managed by an infectious disease specialist. Currently using Premarin  cream to prevent infections and maintain vaginal health. - Obtain urine  sample to check for current infection          Continue all other maintenance medications.  Follow up plan: Return if symptoms worsen or fail to improve.   Continue healthy lifestyle choices, including diet (rich in fruits, vegetables, and lean proteins, and low in salt and simple carbohydrates) and exercise (at least 30 minutes of moderate physical activity daily).   The above assessment and management plan was discussed with the patient. The patient verbalized understanding of and has agreed to the management plan. Patient is aware to call the clinic if they develop any new symptoms or if symptoms persist or worsen. Patient is aware when to return to the clinic for a follow-up visit. Patient educated on when it is appropriate to go to the emergency department.   Rosaline Bruns, FNP-C [facility] [phone]

## 2023-12-17 ENCOUNTER — Ambulatory Visit: Payer: Self-pay | Admitting: Family Medicine

## 2023-12-17 DIAGNOSIS — R000 Tachycardia, unspecified: Secondary | ICD-10-CM

## 2023-12-17 DIAGNOSIS — R42 Dizziness and giddiness: Secondary | ICD-10-CM

## 2023-12-17 DIAGNOSIS — E875 Hyperkalemia: Secondary | ICD-10-CM

## 2023-12-18 NOTE — Telephone Encounter (Signed)
 Patient aware and verbalized understanding. Follow up labs ordered and lab appt scheduled.

## 2023-12-19 ENCOUNTER — Other Ambulatory Visit

## 2023-12-19 DIAGNOSIS — E875 Hyperkalemia: Secondary | ICD-10-CM

## 2023-12-19 LAB — BMP8+EGFR
BUN/Creatinine Ratio: 18 (ref 12–28)
BUN: 19 mg/dL (ref 8–27)
CO2: 18 mmol/L — ABNORMAL LOW (ref 20–29)
Calcium: 9 mg/dL (ref 8.7–10.3)
Chloride: 104 mmol/L (ref 96–106)
Creatinine, Ser: 1.08 mg/dL — ABNORMAL HIGH (ref 0.57–1.00)
Glucose: 90 mg/dL (ref 70–99)
Potassium: 4.1 mmol/L (ref 3.5–5.2)
Sodium: 141 mmol/L (ref 134–144)
eGFR: 52 mL/min/1.73 — ABNORMAL LOW (ref >59)

## 2023-12-20 ENCOUNTER — Ambulatory Visit: Payer: Self-pay | Admitting: Family Medicine

## 2023-12-20 LAB — URINE CULTURE

## 2023-12-21 ENCOUNTER — Telehealth: Payer: Self-pay | Admitting: Nurse Practitioner

## 2023-12-21 NOTE — Telephone Encounter (Signed)
 Copied from CRM [id number]. Topic: General - Other >> Dec 20, 2023  5:28 PM Zebedee SAUNDERS wrote: Reason for CRM: Pt returning [place], Harlene BROCKS, [place] call for lab results which were provided. Pt is scheduled for repeat urine culture. Pt did not have any questions.

## 2023-12-21 NOTE — Telephone Encounter (Signed)
 noted

## 2023-12-21 NOTE — Telephone Encounter (Signed)
 Informed pt antibiotic was already called in and that they were calling her to make sure she finishes it and to come back for repeat culture. Pt verbalized understanding.

## 2023-12-27 ENCOUNTER — Telehealth: Payer: Self-pay | Admitting: Nurse Practitioner

## 2023-12-27 ENCOUNTER — Other Ambulatory Visit: Payer: Self-pay

## 2023-12-27 DIAGNOSIS — R300 Dysuria: Secondary | ICD-10-CM

## 2023-12-27 LAB — CMP14+EGFR
ALT: 17 IU/L (ref 0–32)
AST: 22 IU/L (ref 0–40)
Albumin: 4.2 g/dL (ref 3.8–4.8)
Alkaline Phosphatase: 53 IU/L (ref 44–121)
BUN/Creatinine Ratio: 25 (ref 12–28)
BUN: 29 mg/dL — ABNORMAL HIGH (ref 8–27)
Bilirubin Total: 0.6 mg/dL (ref 0.0–1.2)
CO2: 21 mmol/L (ref 20–29)
Calcium: 9.7 mg/dL (ref 8.7–10.3)
Chloride: 105 mmol/L (ref 96–106)
Creatinine, Ser: 1.18 mg/dL — ABNORMAL HIGH (ref 0.57–1.00)
Globulin, Total: 2.2 g/dL (ref 1.5–4.5)
Glucose: 94 mg/dL (ref 70–99)
Potassium: 5.3 mmol/L — ABNORMAL HIGH (ref 3.5–5.2)
Sodium: 144 mmol/L (ref 134–144)
Total Protein: 6.4 g/dL (ref 6.0–8.5)
eGFR: 47 mL/min/1.73 — ABNORMAL LOW (ref >59)

## 2023-12-27 LAB — ANEMIA PROFILE B
Basophils Absolute: 0 x10E3/uL (ref 0.0–0.2)
Basos: 1 %
EOS (ABSOLUTE): 0.1 x10E3/uL (ref 0.0–0.4)
Eos: 3 %
Ferritin: 105 ng/mL (ref 15–150)
Folate: 11.6 ng/mL (ref >3.0)
Hematocrit: 40.4 % (ref 34.0–46.6)
Hemoglobin: 13.1 g/dL (ref 11.1–15.9)
Immature Grans (Abs): 0 x10E3/uL (ref 0.0–0.1)
Immature Granulocytes: 0 %
Iron Saturation: 31 % (ref 15–55)
Iron: 103 ug/dL (ref 27–139)
Lymphocytes Absolute: 1.6 x10E3/uL (ref 0.7–3.1)
Lymphs: 32 %
MCH: 32.8 pg (ref 26.6–33.0)
MCHC: 32.4 g/dL (ref 31.5–35.7)
MCV: 101 fL — ABNORMAL HIGH (ref 79–97)
Monocytes Absolute: 0.4 x10E3/uL (ref 0.1–0.9)
Monocytes: 7 %
Neutrophils Absolute: 3 x10E3/uL (ref 1.4–7.0)
Neutrophils: 57 %
Platelets: 270 x10E3/uL (ref 150–450)
RBC: 3.99 x10E6/uL (ref 3.77–5.28)
RDW: 11.8 % (ref 11.7–15.4)
Retic Ct Pct: 0.9 % (ref 0.6–2.6)
Total Iron Binding Capacity: 329 ug/dL (ref 250–450)
UIBC: 226 ug/dL (ref 118–369)
Vitamin B-12: 263 pg/mL (ref 232–1245)
WBC: 5.1 x10E3/uL (ref 3.4–10.8)

## 2023-12-27 LAB — THYROID PANEL WITH TSH
Free Thyroxine Index: 2.3 (ref 1.2–4.9)
T3 Uptake Ratio: 30 % (ref 24–39)
T4, Total: 7.5 ug/dL (ref 4.5–12.0)
TSH: 0.764 u[IU]/mL (ref 0.450–4.500)

## 2023-12-27 LAB — HORMONE PANEL (T4,TSH,FSH,TESTT,SHBG,DHEA,ETC)
DHEA-Sulfate, LCMS: <10 ug/dL
Estradiol, Serum, MS: 2 pg/mL
Estrone Sulfate: <10 ng/dL
Follicle Stimulating Hormone: 67 m[IU]/mL
Free T-3: 2.8 pg/mL
Free Testosterone, Serum: 0.4 pg/mL — AB
Progesterone, Serum: <10 ng/dL
Sex Hormone Binding Globulin: 111 nmol/L
T4: 7.5 ug/dL
TSH: 0.72 uU/mL
Testosterone, Serum (Total): 5.3 ng/dL
Testosterone-% Free: 0.7
Triiodothyronine (T-3), Serum: 82 ng/dL

## 2023-12-27 NOTE — Telephone Encounter (Signed)
 Patient has appt 8-22 for one week recheck urine. Orders needs to be put in.

## 2023-12-27 NOTE — Telephone Encounter (Signed)
 Order placed

## 2023-12-29 ENCOUNTER — Other Ambulatory Visit

## 2023-12-29 ENCOUNTER — Ambulatory Visit: Admitting: Family Medicine

## 2023-12-29 ENCOUNTER — Encounter: Payer: Self-pay | Admitting: Family Medicine

## 2023-12-29 VITALS — BP 139/78 | HR 67 | Temp 97.2°F | Ht 61.0 in | Wt 168.0 lb

## 2023-12-29 DIAGNOSIS — R399 Unspecified symptoms and signs involving the genitourinary system: Secondary | ICD-10-CM | POA: Diagnosis not present

## 2023-12-29 LAB — URINALYSIS, COMPLETE
Bilirubin, UA: NEGATIVE
Glucose, UA: NEGATIVE
Ketones, UA: NEGATIVE
Nitrite, UA: NEGATIVE
Protein,UA: NEGATIVE
Specific Gravity, UA: 1.015 (ref 1.005–1.030)
Urobilinogen, Ur: 0.2 mg/dL (ref 0.2–1.0)
pH, UA: 5.5 (ref 5.0–7.5)

## 2023-12-29 LAB — MICROSCOPIC EXAMINATION
Renal Epithel, UA: NONE SEEN /HPF
WBC, UA: >30 /HPF — AB (ref 0–5)
Yeast, UA: NONE SEEN

## 2023-12-29 MED ORDER — DOXYCYCLINE HYCLATE 100 MG PO TABS
100.0000 mg | ORAL_TABLET | Freq: Two times a day (BID) | ORAL | 0 refills | Status: AC
Start: 2023-12-29 — End: ?

## 2023-12-29 NOTE — Progress Notes (Signed)
 BP 139/78   Pulse 67   Temp (!) 97.2 F (36.2 C)   Ht 5' 1 (1.549 m)   Wt 168 lb (76.2 kg)   SpO2 96%   BMI 31.74 kg/m    Subjective:   Patient ID: [name], female    DOB: 08/18/1945, 78 y.o.   MRN: [id number]  HPI: [name] is a 78 y.o. female presenting on 12/29/2023 for Urinary Tract Infection   Discussed the use of AI scribe software for clinical note transcription with the patient, who gave verbal consent to proceed.  History of Present Illness   [name] is a 78 year old female with recurrent urinary tract infections who presents for a urinary recheck.  She completed a course of Cefalexin prescribed on December 15, 2023, for a urinary tract infection, taking the medication as directed and finishing the course a couple of weeks ago. Despite completing the antibiotics, she continues to feel unwell, experiencing symptoms such as cold sweats and a general feeling of malaise over the past two months.  She has a history of back surgery, during which her stem was stretched. No current urinary symptoms, kidney pain, or flank pain. She has previously been monitored with a heart box, but the results did not clarify the cause of her symptoms.          Relevant past medical, surgical, family and social history reviewed and updated as indicated. Interim medical history since our last visit reviewed. Allergies and medications reviewed and updated.  Review of Systems  Constitutional:  Positive for chills. Negative for fever.  Eyes:  Negative for redness and visual disturbance.  Respiratory:  Negative for chest tightness and shortness of breath.   Cardiovascular:  Negative for chest pain and leg swelling.  Genitourinary:  Negative for difficulty urinating, dysuria, frequency and urgency.  Musculoskeletal:  Negative for back pain and gait problem.  Skin:  Negative for rash.  Neurological:  Negative for light-headedness and headaches.  Psychiatric/Behavioral:   Negative for agitation and behavioral problems.   All other systems reviewed and are negative.   Per HPI unless specifically indicated above   Allergies as of 12/29/2023       Reactions   Cashew Nut Oil Hives   Dog Epithelium Hives, Itching   Sulfa Antibiotics Other (See Comments)   blistering lips and mouth sores        Medication List        Accurate as of December 29, 2023 10:56 AM. If you have any questions, ask your nurse or doctor.          albuterol  108 (90 Base) MCG/ACT inhaler Commonly known as: VENTOLIN  HFA Inhale 2 puffs into the lungs every 6 (six) hours as needed for wheezing or shortness of breath.   atorvastatin  40 MG tablet Commonly known as: LIPITOR Take 1 tablet (40 mg total) by mouth daily.   doxycycline  100 MG tablet Commonly known as: VIBRA -TABS Take 1 tablet (100 mg total) by mouth 2 (two) times daily. 1 po bid   furosemide  20 MG tablet Commonly known as: LASIX  Take 1 tablet (20 mg total) by mouth daily.   lisinopril  20 MG tablet Commonly known as: ZESTRIL  Take 1 tablet by mouth once daily   meloxicam  7.5 MG tablet Commonly known as: MOBIC  Take 1 tablet by mouth once daily   methocarbamol  500 MG tablet Commonly known as: ROBAXIN  Take 1 tablet (500 mg total) by mouth at bedtime.   mupirocin   ointment 2 % Commonly known as: BACTROBAN  Apply 1 Application topically as needed (for iritation).   nystatin  cream Commonly known as: MYCOSTATIN  APPLY TOPICALLY AS NEEDED FOR DRY SKIN   nystatin  powder Commonly known as: MYCOSTATIN /NYSTOP  Apply 1 Application topically 3 (three) times daily.   oxyCODONE -acetaminophen  7.5-325 MG tablet Commonly known as: PERCOCET Take 1 tablet by mouth every 8 (eight) hours as needed for severe pain (pain score 7-10).   oxyCODONE -acetaminophen  7.5-325 MG tablet Commonly known as: PERCOCET Take 1 tablet by mouth every 8 (eight) hours as needed for severe pain (pain score 7-10). Start taking on: January 06, 2024   oxyCODONE -acetaminophen  7.5-325 MG tablet Commonly known as: PERCOCET Take 1 tablet by mouth every 8 (eight) hours as needed for severe pain (pain score 7-10). Start taking on: February 05, 2024   pantoprazole  40 MG tablet Commonly known as: PROTONIX  TAKE 1 TABLET BY MOUTH ONCE DAILY BEFORE SUPPER FOR STOMACH PAIN   Premarin  vaginal cream Generic drug: conjugated estrogens  Apply 0.5 mg intravaginally once daily x 2 weeks and then twice a week   tizanidine  2 MG capsule Commonly known as: ZANAFLEX  TAKE 1 CAPSULE BY MOUTH THREE TIMES DAILY         Objective:   BP 139/78   Pulse 67   Temp (!) 97.2 F (36.2 C)   Ht 5' 1 (1.549 m)   Wt 168 lb (76.2 kg)   SpO2 96%   BMI 31.74 kg/m   Wt Readings from Last 3 Encounters:  12/29/23 168 lb (76.2 kg)  12/15/23 169 lb 3.2 oz (76.7 kg)  11/20/23 171 lb (77.6 kg)    Physical Exam Vitals and nursing note reviewed.  Constitutional:      Appearance: Normal appearance.  Abdominal:     Tenderness: There is no abdominal tenderness. There is no right CVA tenderness or left CVA tenderness.  Neurological:     Mental Status: She is alert.    Physical Exam   ABDOMEN: No costovertebral angle tenderness.         Assessment & Plan:   Problem List Items Addressed This Visit   None Visit Diagnoses       UTI symptoms    -  Primary   Relevant Medications   doxycycline  (VIBRA -TABS) 100 MG tablet   Other Relevant Orders   Urinalysis, Complete   Urine Culture          Urinary tract infection Urinalysis indicates significant leukocyturia and bacteriuria. - Prescribed doxycycline  100 mg twice daily.          Follow up plan: Return if symptoms worsen or fail to improve.  Counseling provided for all of the vaccine components Orders Placed This Encounter  Procedures   Urine Culture   Urinalysis, Complete    Fonda Levins, MD [facility] 12/29/2023, 10:56 AM

## 2024-01-01 ENCOUNTER — Ambulatory Visit: Payer: Self-pay | Admitting: Family Medicine

## 2024-01-04 ENCOUNTER — Other Ambulatory Visit: Payer: Self-pay | Admitting: Nurse Practitioner

## 2024-01-04 LAB — URINE CULTURE

## 2024-01-04 MED ORDER — CEFUROXIME AXETIL 250 MG PO TABS: 250.0000 mg | ORAL_TABLET | Freq: Two times a day (BID) | ORAL | 0 refills | Status: AC

## 2024-01-09 ENCOUNTER — Other Ambulatory Visit (HOSPITAL_COMMUNITY): Payer: Self-pay

## 2024-01-09 ENCOUNTER — Telehealth: Payer: Self-pay

## 2024-01-09 NOTE — Telephone Encounter (Signed)
 Left message for patient to call back to get these results.

## 2024-01-09 NOTE — Addendum Note (Signed)
 Addended by: SEVERA ROCK HERO on: 01/09/2024 08:24 AM   Modules accepted: Orders

## 2024-01-09 NOTE — Telephone Encounter (Signed)
 Copied from CRM [id number]. Topic: General - Other >> Jan 09, 2024  3:22 PM Sophia H wrote: Reason for CRM: Patient states she is returning a missed call from clinic regarding her heart monitor. No notes in chart? Called [facility] for clarification but no one in clinic available to take call. Please reach out when you get a chance, ty. Patient states if it is not too much of an emergency it can wait till her appt. # [phone]

## 2024-01-09 NOTE — Telephone Encounter (Signed)
 Left message on patients voicemail that I wasn't sure if someone was contacting her about her heart monitor or if it was the message left about her urine culture and change in antibiotics per lab note. Advised patient to contact the office

## 2024-01-10 NOTE — Progress Notes (Signed)
 Pt r/c.

## 2024-01-16 ENCOUNTER — Ambulatory Visit (INDEPENDENT_AMBULATORY_CARE_PROVIDER_SITE_OTHER): Admitting: Nurse Practitioner

## 2024-01-16 ENCOUNTER — Other Ambulatory Visit (HOSPITAL_COMMUNITY): Payer: Self-pay

## 2024-01-16 ENCOUNTER — Telehealth: Payer: Self-pay

## 2024-01-16 ENCOUNTER — Encounter: Payer: Self-pay | Admitting: Nurse Practitioner

## 2024-01-16 ENCOUNTER — Other Ambulatory Visit: Payer: Self-pay | Admitting: Nurse Practitioner

## 2024-01-16 VITALS — BP 121/63 | HR 52 | Temp 97.9°F | Ht 61.0 in | Wt 170.4 lb

## 2024-01-16 DIAGNOSIS — Z87440 Personal history of urinary (tract) infections: Secondary | ICD-10-CM

## 2024-01-16 DIAGNOSIS — N390 Urinary tract infection, site not specified: Secondary | ICD-10-CM | POA: Diagnosis not present

## 2024-01-16 LAB — MICROSCOPIC EXAMINATION
RBC, Urine: NONE SEEN /HPF (ref 0–2)
Renal Epithel, UA: NONE SEEN /HPF
Yeast, UA: NONE SEEN

## 2024-01-16 LAB — URINALYSIS, ROUTINE W REFLEX MICROSCOPIC
Bilirubin, UA: NEGATIVE
Glucose, UA: NEGATIVE
Nitrite, UA: NEGATIVE
Protein,UA: NEGATIVE
RBC, UA: NEGATIVE
Specific Gravity, UA: 1.02 (ref 1.005–1.030)
Urobilinogen, Ur: 0.2 mg/dL (ref 0.2–1.0)
pH, UA: 5.5 (ref 5.0–7.5)

## 2024-01-16 MED ORDER — FOSFOMYCIN TROMETHAMINE 3 G PO PACK: 3.0000 g | PACK | Freq: Once | ORAL | 0 refills | Status: DC

## 2024-01-16 NOTE — Telephone Encounter (Signed)
 Name from pharmacy: FOSFOMYCIN 3GM      POW  Pharmacy comment: Not covered by insurance.  Requires prior auth or change to alternative med.

## 2024-01-16 NOTE — Progress Notes (Signed)
   Subjective:    Patient ID: [name], female    DOB: 1946/03/26, 78 y.o.   MRN: [id number]   Chief Complaint: UTI RECHECK   HPI Patient was dx with UTI several weeks ago. Was treated and got better. She said that the last one she had she had no urinary symptoms. She just wants urine rechecked. Currently not symptomatic.   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  Eyes:  Negative for pain.  Respiratory:  Negative for shortness of breath.   Cardiovascular:  Negative for chest pain, palpitations and leg swelling.  Gastrointestinal:  Negative for abdominal pain.  Endocrine: Negative for polydipsia.  Skin:  Negative for rash.  Neurological:  Negative for dizziness, weakness and headaches.  Hematological:  Does not bruise/bleed easily.  All other systems reviewed and are negative.      Objective:   Physical Exam Constitutional:      Appearance: Normal appearance. She is obese.  Cardiovascular:     Rate and Rhythm: Normal rate and regular rhythm.     Heart sounds: Normal heart sounds.  Pulmonary:     Breath sounds: Normal breath  sounds.  Abdominal:     Tenderness: There is no right CVA tenderness or left CVA tenderness.  Skin:    General: Skin is warm.  Neurological:     General: No focal deficit present.     Mental Status: She is alert and oriented to person, place, and time.  Psychiatric:        Mood and Affect: Mood normal.     BP 121/63   Pulse (!) 52   Temp 97.9 F (36.6 C)   Ht 5' 1 (1.549 m)   Wt 170 lb 6.4 oz (77.3 kg)   SpO2 97%   BMI 32.20 kg/m        Assessment & Plan:  GERRY HEAPHY in today with chief complaint of UTI RECHECK   1. Recent urinary tract infection (Primary) - Urinalysis, Routine w reflex microscopic  2. Recurrent UTI Last culture grew Klebsiella pneumoniae Abnormal  - was resistant to multiple antibiotics  Will try fosfomycin 1x dose and will recheck urine in 1 week Force fluids     The above assessment and management plan was discussed with the patient. The patient verbalized understanding of and has agreed to the management plan. Patient is aware to call the clinic if symptoms persist or worsen. Patient is aware when to return to the clinic for a follow-up visit. Patient educated on when it is appropriate to go to the emergency department.   Mary-Margaret Gladis, FNP

## 2024-01-16 NOTE — Telephone Encounter (Signed)
 Pharmacy Patient Advocate Encounter   Received notification from CoverMyMeds that prior authorization for Fosfomycin Tromethamine  3GM packets  is required/requested.   Insurance verification completed.   The patient is insured through [facility] .   Per test claim: PA required; PA submitted to above mentioned insurance via Latent Key/confirmation #/EOC [id number] Status is pending

## 2024-01-16 NOTE — Progress Notes (Unsigned)
 Subjective:  Chief complaint: sweating and malaise recently   Patient ID: [name] Rod, female    DOB: 25-Jul-1945, 78 y.o.   MRN: [id number]  HPI  Discussed the use of AI scribe software for clinical note transcription with the patient, who gave verbal consent to proceed.  History of Present Illness   [name] is a 78 year old female with chronic urinary symptoms who presents with recent episodes of  sweating and malaise.    She has experienced chronic urinary symptoms since 2009, including difficulty emptying her bladder, a burning sensation during urination, and occasional nausea. These symptoms have been intermittent, sometimes improving with antibiotics but often recurring. She has had urinary tract infections, one of which led to a bloodstream infection.      When seen by my partner Dr. Overton he gave her Margurite --though he was skeptical of UTI and this did NOT improve her symptoms at all.    Recently, she has been experiencing excessive sweating for about six weeks, which has improved with time She describes episodes of cold sweats that would soak her clothes, occurring when she was active and sometimes at night. She feels better now and no longer experiences these sweating episodes.   She saw PCP in early August who did a battery of tests to work her up. Included in the workup was UA and culture--THOUGH the patient did NOT have urinary tract symptoms from what she is telling me today and from reading the notes   UA showed pyuria and culture grew  multidrug-resistant Klebsiella pneumoniae. The bacteria were resistant to most antibiotics except for amikacin, carbapenems, ceftazidime-avibactam, and meropenem-vaborbactam   She was given keflex  and then doxycyline which have zero activity vs the klebsiella. If she is better now because of them it is not because they had any effect on organisms in her bladder.       Past Medical History:  Diagnosis Date   Arthritis     Bacteriuria 07/10/2023   Cancer (HCC)    ovarian,skin Ca. in mouth   Cataract    Bil cataracts removed   Chronic constipation    Chronic kidney disease    Chronic midline low back pain with sciatica    Colon polyp    large ascending colon polyp , scheduled for resection 02/ 2021   Full dentures    History of endometrial cancer 2012   s/p   TAH w/ BSO 11-02-2010   History of recurrent UTIs    History of sepsis    multiple times (some due to e.coli) last sepsis 07/ 2019   History of small bowel obstruction    x2  ,  hx bowel resection   HOH (hard of hearing)    Hyperlipidemia    Hypertension    Lower urinary tract symptoms (LUTS) 07/10/2023   Retroperitoneal fibrosis    Substance abuse (HCC)    opiod dependancy   Ureteral obstruction, left urologist-- dr renda   secondary to adhesions/ retroperitoneal fibrosis, treated with ureteral stent   Vertigo    Wears glasses     Past Surgical History:  Procedure Laterality Date   ABDOMINAL HYSTERECTOMY  11/02/10  dr dodie  @[facility]    TAH, BSO, incisional hernia repair , lysis of adhesions for endometrial cancer   ANTERIOR LUMBAR FUSION  01-28-2008   dr elsner  @[facility]    L2 -- L4   BACK SURGERY     BUNIONECTOMY Right 2003   CATARACT EXTRACTION W/ INTRAOCULAR LENS  IMPLANT, BILATERAL  2007   COLONOSCOPY  last one 12-24-2018   CYSTO/  LEFT RETROGRADE PYELOGRAM/ URETEROSCOPY STENT PLACEMENT/  OPEN URETEROLYSIS WITH OMENTAL FLAP Left 03-11-2009    dr borden  @[facility]    CYSTO/  [facility]/  ANTERIOR REPAIR  12-06-2001    dr watt @[facility]    CYSTOSCOPY W/ URETERAL STENT PLACEMENT Left 12/07/2017   Procedure: CYSTOSCOPY WITH RETROGRADE PYELOGRAM/URETERAL STENT PLACEMENT;  Surgeon: Renda Glance, MD;  Location: [facility];  Service: Urology;  Laterality: Left;   CYSTOSCOPY W/ URETERAL STENT PLACEMENT Left 03/29/2018   Procedure: CYSTOSCOPY WITH STENT  EXCHANGE;  Surgeon: Renda Glance, MD;  Location: [facility];  Service: Urology;  Laterality: Left;    CYSTOSCOPY WITH RETROGRADE PYELOGRAM, URETEROSCOPY AND STENT PLACEMENT Left 12-29-2008   dr renda  @[facility]    WITH BALLOON DILATION LEFT URETERAL STRICTURE   CYSTOSCOPY WITH RETROGRADE PYELOGRAM, URETEROSCOPY AND STENT PLACEMENT Left 09-12-2008    dr watt @[facility]    CYSTOSCOPY WITH RETROGRADE PYELOGRAM, URETEROSCOPY AND STENT PLACEMENT Left 03/23/2020   Procedure: CYSTOSCOPY WITH LEFT RETROGRADE PYELOGRAM, AND LEFT STENT REMOVAL;  Surgeon: Renda Glance, MD;  Location: [facility];  Service: Urology;  Laterality: Left;   CYSTOSCOPY WITH STENT PLACEMENT Left 07/26/2018   Procedure: CYSTOSCOPY WITH STENT CHANGE;  Surgeon: Renda Glance, MD;  Location: [facility];  Service: Urology;  Laterality: Left;   CYSTOSCOPY WITH STENT PLACEMENT Left 12/03/2018   Procedure: CYSTOSCOPY WITH STENT CHANGE;  Surgeon: Renda Glance, MD;  Location: [facility];  Service: Urology;  Laterality: Left;   CYSTOSCOPY WITH STENT PLACEMENT Left 04/22/2019   Procedure: CYSTOSCOPY WITH STENT CHANGE;  Surgeon: Renda Glance, MD;  Location: [facility];  Service: Urology;  Laterality: Left;   CYSTOSCOPY WITH STENT PLACEMENT Left 10/03/2019   Procedure: CYSTOSCOPY WITH STENT CHANGE;  Surgeon: Renda Glance, MD;  Location: [facility];  Service: Urology;  Laterality: Left;   EYE SURGERY     FOOT SURGERY Left 06-23-2010   dr vickye   left first and second toes   INSERTION OF MESH N/A 04/23/2013   Procedure: INSERTION OF MESH;  Surgeon: Elspeth KYM Schultze, MD;  Location: [facility];  Service: General;  Laterality: N/A;   JOINT REPLACEMENT     KNEE ARTHROSCOPY Right 04-27-2007  dr amy @[facility]    LAPAROSCOPIC LYSIS OF ADHESIONS N/A 04/23/2013   Procedure: LAPAROSCOPIC LYSIS OF ADHESIONS;  Surgeon: Elspeth KYM Schultze, MD;  Location: [facility];  Service: General;  Laterality: N/A;   LUMBAR SPINE SURGERY  1989 and 1999   POSTERIOR LUMBAR FUSION  12/17/2012   L3 -- L5 laminectomy and L3 -- 5 fusion   TOTAL HIP ARTHROPLASTY Left 07/04/2014    Procedure: LEFT TOTAL HIP ARTHROPLASTY ANTERIOR APPROACH;  Surgeon: Dempsey Melodi GAILS, MD;  Location: [facility];  Service: Orthopedics;  Laterality: Left;   TOTAL HIP ARTHROPLASTY Right 2009   TOTAL KNEE ARTHROPLASTY Right 09-15-2009   dr amy @[facility]    VENTRAL HERNIA REPAIR N/A 07/19/2012   Procedure: LAPAROSCOPIC LYSIS OF ADHESIONS, SMALL BOWEL RESECTION, SEROSAL REPAIR, PRIMARY VENTRAL HERNIA REPAIR;  Surgeon: Elspeth KYM Schultze, MD;  Location: [facility];  Service: General;  Laterality: N/A;   VENTRAL HERNIA REPAIR N/A 04/23/2013   Procedure: LAPAROSCOPIC exploration and repair of hernia in abdominal VENTRAL wall  HERNIA;  Surgeon: Elspeth KYM Schultze, MD;  Location: [facility];  Service: General;  Laterality: N/A;    Family History  Problem Relation Age of Onset   Diabetes Mother    Lung cancer Mother  Diabetes Father    Liver cancer Father    Colon cancer Sister        close to 4 per pt   Diabetes Sister    Diabetes Sister    Hypertension Sister    Kidney disease Sister    Seizures Daughter    Diabetes Son    Breast cancer Neg Hx    Allergic rhinitis Neg Hx    Angioedema Neg Hx    Asthma Neg Hx    Atopy Neg Hx    Eczema Neg Hx    Immunodeficiency Neg Hx    Urticaria Neg Hx    Colon polyps Neg Hx    Esophageal cancer Neg Hx    Rectal cancer Neg Hx    Stomach cancer Neg Hx       Social History   Socioeconomic History   Marital status: Single    Spouse name: Not on file   Number of children: 3   Years of education: 12   Highest education level: High school graduate  Occupational History   Occupation: Retired  Tobacco Use   Smoking status: Never   Smokeless tobacco: Never  Vaping Use   Vaping status: Never Used  Substance and Sexual Activity   Alcohol use: No   Drug use: No   Sexual activity: Not Currently    Birth control/protection: Surgical  Other Topics Concern   Not on file  Social History Narrative   Her son and DIL live with her   They help with bills   Social  Drivers of Health   Financial Resource Strain: Low Risk  (06/28/2023)   Overall Financial Resource Strain (CARDIA)    Difficulty of Paying Living Expenses: Not hard at all  Food Insecurity: No Food Insecurity (06/28/2023)   Hunger Vital Sign    Worried About Running Out of Food in the Last Year: Never true    Ran Out of Food in the Last Year: Never true  Transportation Needs: No Transportation Needs (06/28/2023)   PRAPARE - Administrator, Civil Service (Medical): No    Lack of Transportation (Non-Medical): No  Physical Activity: Insufficiently Active (06/28/2023)   Exercise Vital Sign    Days of Exercise per Week: 3 days    Minutes of Exercise per Session: 30 min  Stress: No Stress Concern Present (06/28/2023)   [employer] of Occupational Health - Occupational Stress Questionnaire    Feeling of Stress : Not at all  Social Connections: Moderately Isolated (06/28/2023)   Social Connection and Isolation Panel    Frequency of Communication with Friends and Family: More than three times a week    Frequency of Social Gatherings with Friends and Family: Three times a week    Attends Religious Services: More than 4 times per year    Active Member of Clubs or Organizations: No    Attends Banker Meetings: Never    Marital Status: Widowed    Allergies  Allergen Reactions   Cashew Nut Oil Hives   Dog Epithelium Hives and Itching   Sulfa Antibiotics Other (See Comments)    blistering lips and mouth sores     Current Outpatient Medications:    albuterol  (VENTOLIN  HFA) 108 (90 Base) MCG/ACT inhaler, Inhale 2 puffs into the lungs every 6 (six) hours as needed for wheezing or shortness of breath., Disp: 8 g, Rfl: 2   atorvastatin  (LIPITOR) 40 MG tablet, Take 1 tablet (40 mg total) by mouth daily., Disp: 90  tablet, Rfl: 1   conjugated estrogens  (PREMARIN ) vaginal cream, Apply 0.5 mg intravaginally once daily x 2 weeks and then twice a week, Disp: 42.5 g, Rfl: 12    doxycycline  (VIBRA -TABS) 100 MG tablet, Take 1 tablet (100 mg total) by mouth 2 (two) times daily. 1 po bid (Patient not taking: Reported on 01/16/2024), Disp: 20 tablet, Rfl: 0   fosfomycin (MONUROL ) 3 g PACK, Take 3 g by mouth once for 1 dose., Disp: 3 g, Rfl: 0   furosemide  (LASIX ) 20 MG tablet, Take 1 tablet (20 mg total) by mouth daily., Disp: 90 tablet, Rfl: 0   lisinopril  (ZESTRIL ) 20 MG tablet, Take 1 tablet by mouth once daily, Disp: 90 tablet, Rfl: 0   meloxicam  (MOBIC ) 7.5 MG tablet, Take 1 tablet by mouth once daily, Disp: 90 tablet, Rfl: 0   methocarbamol  (ROBAXIN ) 500 MG tablet, Take 1 tablet (500 mg total) by mouth at bedtime., Disp: 30 tablet, Rfl: 1   mupirocin  ointment (BACTROBAN ) 2 %, Apply 1 Application topically as needed (for iritation)., Disp: , Rfl:    nystatin  (MYCOSTATIN /NYSTOP ) powder, Apply 1 Application topically 3 (three) times daily., Disp: 60 g, Rfl: 2   nystatin  cream (MYCOSTATIN ), APPLY TOPICALLY AS NEEDED FOR DRY SKIN, Disp: 30 g, Rfl: 0   [START ON 02/05/2024] oxyCODONE -acetaminophen  (PERCOCET) 7.5-325 MG tablet, Take 1 tablet by mouth every 8 (eight) hours as needed for severe pain (pain score 7-10)., Disp: 90 tablet, Rfl: 0   oxyCODONE -acetaminophen  (PERCOCET) 7.5-325 MG tablet, Take 1 tablet by mouth every 8 (eight) hours as needed for severe pain (pain score 7-10)., Disp: 90 tablet, Rfl: 0   pantoprazole  (PROTONIX ) 40 MG tablet, TAKE 1 TABLET BY MOUTH ONCE DAILY BEFORE SUPPER FOR STOMACH PAIN, Disp: 90 tablet, Rfl: 0   tizanidine  (ZANAFLEX ) 2 MG capsule, TAKE 1 CAPSULE BY MOUTH THREE TIMES DAILY, Disp: 30 capsule, Rfl: 0   Review of Systems  Constitutional:  Positive for diaphoresis. Negative for activity change, appetite change, chills, fatigue, fever and unexpected weight change.  HENT:  Negative for congestion, rhinorrhea, sinus pressure, sneezing, sore throat and trouble swallowing.   Eyes:  Negative for photophobia and visual disturbance.  Respiratory:   Negative for cough, chest tightness, shortness of breath, wheezing and stridor.   Cardiovascular:  Negative for chest pain, palpitations and leg swelling.  Gastrointestinal:  Negative for abdominal distention, abdominal pain, anal bleeding, blood in stool, constipation, diarrhea, nausea and vomiting.  Genitourinary:  Negative for difficulty urinating, dysuria, flank pain and hematuria.  Musculoskeletal:  Negative for arthralgias, back pain, gait problem, joint swelling and myalgias.  Skin:  Negative for color change, pallor, rash and wound.  Neurological:  Negative for dizziness, tremors, weakness and light-headedness.  Hematological:  Negative for adenopathy. Does not bruise/bleed easily.  Psychiatric/Behavioral:  Negative for agitation, behavioral problems, confusion, decreased concentration, dysphoric mood and sleep disturbance.        Objective:   Physical Exam Constitutional:      General: She is not in acute distress.    Appearance: Normal appearance. She is well-developed. She is not ill-appearing or diaphoretic.  HENT:     Head: Normocephalic and atraumatic.     Right Ear: Hearing and external ear normal.     Left Ear: Hearing and external ear normal.     Nose: No nasal deformity or rhinorrhea.  Eyes:     General: No scleral icterus.    Conjunctiva/sclera: Conjunctivae normal.     Right eye: Right conjunctiva is not  injected.     Left eye: Left conjunctiva is not injected.     Pupils: Pupils are equal, round, and reactive to light.  Neck:     Vascular: No JVD.  Cardiovascular:     Rate and Rhythm: Normal rate and regular rhythm.     Heart sounds: S1 normal and S2 normal.  Pulmonary:     Effort: Pulmonary effort is normal. No respiratory distress.     Breath sounds: No wheezing.  Abdominal:     General: There is no distension.     Palpations: Abdomen is soft.  Musculoskeletal:        General: Normal range of motion.     Right shoulder: Normal.     Left shoulder:  Normal.     Cervical back: Normal range of motion and neck supple.     Right hip: Normal.     Left hip: Normal.     Right knee: Normal.     Left knee: Normal.  Lymphadenopathy:     Head:     Right side of head: No submandibular, preauricular or posterior auricular adenopathy.     Left side of head: No submandibular, preauricular or posterior auricular adenopathy.     Cervical: No cervical adenopathy.     Right cervical: No superficial or deep cervical adenopathy.    Left cervical: No superficial or deep cervical adenopathy.  Skin:    General: Skin is warm and dry.     Coloration: Skin is not pale.     Findings: No abrasion, bruising, ecchymosis, erythema, lesion or rash.     Nails: There is no clubbing.  Neurological:     Mental Status: She is alert and oriented to person, place, and time.     Sensory: No sensory deficit.     Coordination: Coordination normal.     Gait: Gait normal.  Psychiatric:        Attention and Perception: She is attentive.        Mood and Affect: Mood normal.        Speech: Speech normal.        Behavior: Behavior normal. Behavior is cooperative.        Thought Content: Thought content normal.        Judgment: Judgment normal.           Assessment & Plan:   Assessment and Plan    Recent episodes of sweating:   Certainly under certain circumstances I would worry about systemic infection. That being said symptoms are now resolved.   DIfferential for infection that could cause these is broad for an acute subacute presentation including intra-abdominal infection, odontogenic infection with bacteremia, etc   Out of thoroughness I will check set of blood cultures x2 and CMP   Asymptomatic bacteriuria with MDR Klebsiella: This organism FORTUNATELY is not a pathogen based on her SYMPTOMS and clinical picture   As mentioned before one of the two best things that can be done for her health are #1 to stop rx antibiotics unless indicated since abx  select for R flora in the body including the bladder and #2 avoid unnecessary cultures of the urine since this leads to confusion and more of problem #1    Chronic lower urinary tract symptoms without evidence of active infection. She actually in recent history is NOT describing ANY symptoms related to her urinary tract  Most of symptoms she has had I believe are non-infectious - Continue Premarin  vaginal cream. - try to  coach providers to avoid unnecessary urine cultures.

## 2024-01-17 ENCOUNTER — Other Ambulatory Visit: Payer: Self-pay

## 2024-01-17 ENCOUNTER — Encounter: Payer: Self-pay | Admitting: Infectious Disease

## 2024-01-17 ENCOUNTER — Other Ambulatory Visit (HOSPITAL_COMMUNITY): Payer: Self-pay

## 2024-01-17 ENCOUNTER — Ambulatory Visit (INDEPENDENT_AMBULATORY_CARE_PROVIDER_SITE_OTHER): Payer: Self-pay | Admitting: Infectious Disease

## 2024-01-17 VITALS — BP 169/80 | HR 59 | Temp 97.5°F | Wt 172.0 lb

## 2024-01-17 DIAGNOSIS — Z22358 Carrier of other enterobacterales: Secondary | ICD-10-CM

## 2024-01-17 DIAGNOSIS — R399 Unspecified symptoms and signs involving the genitourinary system: Secondary | ICD-10-CM | POA: Diagnosis not present

## 2024-01-17 DIAGNOSIS — R110 Nausea: Secondary | ICD-10-CM

## 2024-01-17 DIAGNOSIS — Z8639 Personal history of other endocrine, nutritional and metabolic disease: Secondary | ICD-10-CM

## 2024-01-17 DIAGNOSIS — Z87440 Personal history of urinary (tract) infections: Secondary | ICD-10-CM | POA: Diagnosis not present

## 2024-01-17 DIAGNOSIS — R8271 Bacteriuria: Secondary | ICD-10-CM

## 2024-01-17 DIAGNOSIS — R61 Generalized hyperhidrosis: Secondary | ICD-10-CM

## 2024-01-17 DIAGNOSIS — N390 Urinary tract infection, site not specified: Secondary | ICD-10-CM | POA: Diagnosis not present

## 2024-01-17 DIAGNOSIS — R300 Dysuria: Secondary | ICD-10-CM

## 2024-01-17 DIAGNOSIS — B961 Klebsiella pneumoniae [K. pneumoniae] as the cause of diseases classified elsewhere: Secondary | ICD-10-CM | POA: Diagnosis not present

## 2024-01-17 DIAGNOSIS — R3914 Feeling of incomplete bladder emptying: Secondary | ICD-10-CM | POA: Diagnosis not present

## 2024-01-17 NOTE — Telephone Encounter (Signed)
 Pharmacy Patient Advocate Encounter  Received notification from [facility] that Prior Authorization for Fosfomycin Tromethamine  3GM packets  has been APPROVED from 01/17/2024 to 01/16/2025   PA #/Case ID/Reference #: [id number]

## 2024-01-17 NOTE — Telephone Encounter (Signed)
 Left detailed message making patient aware that her antibiotic was approved through [facility].

## 2024-01-18 ENCOUNTER — Ambulatory Visit: Payer: Self-pay | Admitting: Nurse Practitioner

## 2024-01-19 DIAGNOSIS — M7062 Trochanteric bursitis, left hip: Secondary | ICD-10-CM | POA: Diagnosis not present

## 2024-01-19 DIAGNOSIS — M7061 Trochanteric bursitis, right hip: Secondary | ICD-10-CM | POA: Diagnosis not present

## 2024-01-22 ENCOUNTER — Ambulatory Visit: Admitting: Nurse Practitioner

## 2024-01-23 ENCOUNTER — Encounter: Payer: Self-pay | Admitting: Gastroenterology

## 2024-01-23 LAB — COMPLETE METABOLIC PANEL WITHOUT GFR
AG Ratio: 1.7 (calc) (ref 1.0–2.5)
ALT: 9 U/L (ref 6–29)
AST: 15 U/L (ref 10–35)
Albumin: 4 g/dL (ref 3.6–5.1)
Alkaline phosphatase (APISO): 45 U/L (ref 37–153)
BUN: 18 mg/dL (ref 7–25)
CO2: 24 mmol/L (ref 20–32)
Calcium: 9.1 mg/dL (ref 8.6–10.4)
Chloride: 109 mmol/L (ref 98–110)
Creat: 0.91 mg/dL (ref 0.60–1.00)
Globulin: 2.4 g/dL (ref 1.9–3.7)
Glucose, Bld: 101 mg/dL — ABNORMAL HIGH (ref 65–99)
Potassium: 4.3 mmol/L (ref 3.5–5.3)
Sodium: 141 mmol/L (ref 135–146)
Total Bilirubin: 0.5 mg/dL (ref 0.2–1.2)
Total Protein: 6.4 g/dL (ref 6.1–8.1)

## 2024-01-23 LAB — CULTURE, BLOOD (SINGLE)
MICRO NUMBER:: 16950314
MICRO NUMBER:: 16950315
Result:: NO GROWTH
Result:: NO GROWTH
SPECIMEN QUALITY:: ADEQUATE
SPECIMEN QUALITY:: ADEQUATE

## 2024-01-31 ENCOUNTER — Other Ambulatory Visit: Payer: Self-pay | Admitting: Nurse Practitioner

## 2024-01-31 DIAGNOSIS — I10 Essential (primary) hypertension: Secondary | ICD-10-CM

## 2024-02-13 ENCOUNTER — Encounter: Payer: Self-pay | Admitting: Student

## 2024-02-13 ENCOUNTER — Ambulatory Visit: Attending: Student | Admitting: Student

## 2024-02-13 VITALS — BP 129/81 | HR 73 | Wt 174.0 lb

## 2024-02-13 DIAGNOSIS — R61 Generalized hyperhidrosis: Secondary | ICD-10-CM | POA: Diagnosis not present

## 2024-02-13 DIAGNOSIS — R0609 Other forms of dyspnea: Secondary | ICD-10-CM

## 2024-02-13 DIAGNOSIS — E785 Hyperlipidemia, unspecified: Secondary | ICD-10-CM

## 2024-02-13 DIAGNOSIS — I25119 Atherosclerotic heart disease of native coronary artery with unspecified angina pectoris: Secondary | ICD-10-CM

## 2024-02-13 DIAGNOSIS — I471 Supraventricular tachycardia, unspecified: Secondary | ICD-10-CM

## 2024-02-13 DIAGNOSIS — R072 Precordial pain: Secondary | ICD-10-CM | POA: Diagnosis not present

## 2024-02-13 DIAGNOSIS — I10 Essential (primary) hypertension: Secondary | ICD-10-CM | POA: Diagnosis not present

## 2024-02-13 DIAGNOSIS — R0602 Shortness of breath: Secondary | ICD-10-CM

## 2024-02-13 MED ORDER — METOPROLOL TARTRATE 25 MG PO TABS: 25.0000 mg | ORAL_TABLET | Freq: Once | ORAL | 0 refills | Status: DC

## 2024-02-13 NOTE — Progress Notes (Unsigned)
 Cardiology Office Note:    Date:  02/13/2024   ID:  [name], DOB [birth year], MRN [id number]  PCP:  Gladis Mustard, FNP  Cardiologist:  None { Click to update primary MD,subspecialty MD or APP then REFRESH:1}    Referring MD: Severa Rock HERO, FNP   Chief Complaint: ***  History of Present Illness:    [name] is a 78 y.o. female with a history of mild to moderate non-obstructive CAD noted on coronary CTA in 11/2021. hypertension, hyperlipidemia, small bowel obstruction, retroperitoneal fibrosis, chronic back pain, chronic constipation who is followed by Dr. Jeffrie who presents today for tachycardia. ***  Patient was referred to Dr. Jeffrie in 11/2021 for further evaluation of chest pain.  Coronary CTA was ordered and showed a coronary calcium  score of 606 (86 percentile for age and sex) with only mild to moderate atherosclerosis.  FFR did not demonstrate any hemodynamically flow-limiting lesions.  Echo was also ordered for further evaluation of her murmur noted on exam and showed LVEF of 60-65% with normal wall motion and moderate LVH of the basal septal segment, normal RV function, no significant valvular disease (only trivial MR).  She has not been seen by Cardiology since 2023.  PCP recently ordered a monitor in 12/2023 for further evaluation of ***which showed 91 short episodes of SVT (longest run 17 beats) as well as occasional PACs (2.9%) and rare PVCs (<1.0%).   Patient presents today for follow-up of this. ***  First having infection. When she gets up to do anything gets soaking wet with sweat.   Tightness/ squeazing but only notice at rest. Pressing on chest.   SOB 2 months. 16 step mild  No orthonpnea, PND,   No palp, LH/ DZ, syncope.   Left leg edema since 30.   Normal heart rates 40-50 bpm.   Tachycardia ***  Mild to Moderate Non-Obstructive CAD Coronary CTA was ordered and showed a coronary calcium  score of 606 (86 percentile for age and sex)  with only mild to moderate atherosclerosis.  FFR did not demonstrate any hemodynamically flow-limiting lesions.   - No chest pain. *** - Recommend starting Aspirin 81mg  daily. *** - Continue Lipitor 40mg  daily.  Hypertension BP well controlled. *** - Continue Lisinopril  20mg  daily.  - Also on Lasix  20mg  daily for ***  Hyperlipidemia Lipid panel in 09/2023: Total Cholesterol 143, Triglycerides 60, HDL 89, LDL 52. LDL goal <70.  - Continue Lipitor 40mg  daily. - Labs followed by PCP.  EKGs/Labs/Other Studies Reviewed:    The following studies were reviewed:  Echocardiogram 12/14/2021: Impressions:  1. Left ventricular ejection fraction, by estimation, is 60 to 65%. The  left ventricle has normal function. The left ventricle has no regional  wall motion abnormalities. There is moderate left ventricular hypertrophy  of the basal-septal segment. Left  ventricular diastolic parameters were normal.   2. Right ventricular systolic function is normal. The right ventricular  size is normal. There is normal pulmonary artery systolic pressure. The  estimated right ventricular systolic pressure is 31.7 mmHg.   3. The mitral valve is normal in structure. Trivial mitral valve  regurgitation. No evidence of mitral stenosis.   4. The aortic valve is normal in structure. Aortic valve regurgitation is  not visualized. No aortic stenosis is present.   5. There is mild dilatation of the ascending aorta, measuring 38 mm.   6. The inferior vena cava is normal in size with greater than 50%  respiratory variability, suggesting right atrial  pressure of 3 mmHg.  _______________  Coronary CTA 12/23/2021: Impression: 1. Coronary calcium  score of 606. This was 86th percentile for age-, sex, and race-matched controls. 2.  Normal coronary origin with right dominance. 3.  Mild to moderate atherosclerosis.  CAD RADs 2. 4.  Recommend preventive therapy and risk factor modification. 5.  This study has been  submitted for FFR analysis. _______________  Monitor 12/15/2023 to 12/29/2023: Patient had a min HR of 33 bpm, max HR of 176 bpm, and avg HR of 64 bpm. Predominant underlying rhythm was Sinus Rhythm. 91 Supraventricular Tachycardia runs occurred, the run with the fastest interval lasting 10 beats with a max rate of 176 bpm, the  longest lasting 17 beats with an avg rate of 104 bpm. Isolated SVEs were occasional (2.9%, 63334), SVE Couplets were rare (<1.0%, 439), and SVE Triplets were rare (<1.0%, 93). Isolated VEs were rare (<1.0%), VE Couplets were rare (<1.0%), and no VE  Triplets were present.    EKG:  EKG ordered today.   EKG Interpretation Date/Time:  Tuesday February 13 2024 14:32:06 EDT Ventricular Rate:  76 PR Interval:  146 QRS Duration:  78 QT Interval:  370 QTC Calculation: 416 R Axis:   -9  Text Interpretation: Sinus rhythm with marked sinus arrhythmia Premature atrial complexes Cannot rule out Anterior infarct , age undetermined  No signfiicant changes compared to prior tracings Confirmed by Jadine Patient [phone]) on 02/13/2024 2:49:40 PM    Recent Labs: 12/15/2023: Hemoglobin 13.1; Platelets 270; TSH 0.764 01/17/2024: ALT 9; BUN 18; Creat 0.91; Potassium 4.3; Sodium 141  Recent Lipid Panel    Component Value Date/Time   CHOL 153 09/07/2023 1129   CHOL 154 11/13/2012 1018   TRIG 60 09/07/2023 1129   TRIG 86 11/13/2012 1018   HDL 89 09/07/2023 1129   HDL 57 11/13/2012 1018   CHOLHDL 1.7 09/07/2023 1129   LDLCALC 52 09/07/2023 1129   LDLCALC 80 11/13/2012 1018    Physical Exam:    Vital Signs: BP 129/81   Pulse 93   Wt 174 lb (78.9 kg)   SpO2 100%   BMI 32.88 kg/m     Wt Readings from Last 3 Encounters:  02/13/24 174 lb (78.9 kg)  01/17/24 172 lb (78 kg)  01/16/24 170 lb 6.4 oz (77.3 kg)     General: 78 y.o. Caucasian female in no acute distress. HEENT: Normocephalic and atraumatic. Sclera clear.  Neck: Supple. No JVD. Heart: RRR. Distinct S1 and S2. No  murmurs, gallops, or rubs.  Lungs: No increased work of breathing. Clear to ausculation bilaterally. No wheezes, rhonchi, or rales.   Extremities: Mild left lower extremity edema (chronic). No lower extremity edema.   Skin: Warm and dry. Neuro: No focal deficits. Psych: Normal affect. Responds appropriately.  Assessment:    1. Primary hypertension     Plan:     Disposition: Follow up in ***   Signed, Patient FORBES Jadine, PA-C  02/13/2024 3:11 PM    [facility]

## 2024-02-13 NOTE — Patient Instructions (Signed)
 Medication Instructions:  Your physician recommends that you continue on your current medications as directed. Please refer to the Current Medication list given to you today.  *If you need a refill on your cardiac medications before your next appointment, please call your pharmacy*  Lab Work: BMP  If you have labs (blood work) drawn today and your tests are completely normal, you will receive your results only by: MyChart Message (if you have MyChart) OR A paper copy in the mail If you have any lab test that is abnormal or we need to change your treatment, we will call you to review the results.  Testing/Procedures: Your physician has requested that you have an echocardiogram. Echocardiography is a painless test that uses sound waves to create images of your heart. It provides your doctor with information about the size and shape of your heart and how well your heart's chambers and valves are working. This procedure takes approximately one hour. There are no restrictions for this procedure. Please do NOT wear cologne, perfume, aftershave, or lotions (deodorant is allowed). Please arrive 15 minutes prior to your appointment time.  Please note: We ask at that you not bring children with you during ultrasound (echo/ vascular) testing. Due to room size and safety concerns, children are not allowed in the ultrasound rooms during exams. Our front office staff cannot provide observation of children in our lobby area while testing is being conducted. An adult accompanying a patient to their appointment will only be allowed in the ultrasound room at the discretion of the ultrasound technician under special circumstances. We apologize for any inconvenience.   Your physician has requested that you have cardiac CT. Cardiac computed tomography (CT) is a painless test that uses an x-ray machine to take clear, detailed pictures of your heart. For further information please visit [email or web address] Please  follow instruction sheet as given.    Follow-Up: At [facility], you and your health needs are our priority.  As part of our continuing mission to provide you with exceptional heart care, our providers are all part of one team.  This team includes your primary Cardiologist (physician) and Advanced Practice Providers or APPs (Physician Assistants and Nurse Practitioners) who all work together to provide you with the care you need, when you need it.  Your next appointment:   2 month(s)  Provider:   Callie Goodrich, PA-C       Other Instructions   Your cardiac CT will be scheduled at one of the below locations:   [facility] [address] [place], [place] [address] [phone] (Severe contrast allergies only)  OR   [facility] [address] [place], [place] [address] [phone]  OR   [facility] [address] [place], [place] [address] [phone]  OR   [facility] [address]  [place], [place] [address] [phone]  OR   [facility] [address] [place], [place] [phone]  If scheduled at [facility], please arrive at the [facility] and Children's Entrance (Entrance C2) of [facility] 30 minutes prior to test start time. You can use the FREE valet parking offered at entrance C (encouraged to control the heart rate for the test)  Proceed to the [facility] Radiology Department (first floor) to check-in and test prep.  All radiology patients and guests should use entrance C2 at [facility], accessed from [facility], even though the  hospital's physical address listed is [address].  If scheduled at the Heart and Vascular Tower at [address], please enter the parking lot using the [address] entrance and use the FREE valet service at the patient drop-off area. Enter the building and check-in with  registration on the main floor.  If scheduled at [facility], please arrive to the Heart and Vascular Center 15 mins early for check-in and test prep.  There is spacious parking and easy access to the radiology department from the [facility] entrance. Please enter here and check-in with the desk attendant.   If scheduled at [facility], please arrive 30 minutes early for check-in and test prep.  Please follow these instructions carefully (unless otherwise directed):  An IV will be required for this test and Nitroglycerin  will be given.  Hold all erectile dysfunction medications at least 3 days (72 hrs) prior to test. (Ie viagra, cialis, sildenafil, tadalafil, etc)   On the Night Before the Test: Be sure to Drink plenty of water . Do not consume any caffeinated/decaffeinated beverages or chocolate 12 hours prior to your test. Do not take any antihistamines 12 hours prior to your test.    On the Day of the Test: Drink plenty of water  until 1 hour prior to the test. Do not eat any food 1 hour prior to test. You may take your regular medications prior to the test.  Take metoprolol  (Lopressor ) 25 mg by mouth two hours prior to test.  ONLY TAKE IF YOUR HEART RATE IS ABOVE 65 If you take Furosemide /Hydrochlorothiazide /Spironolactone/Chlorthalidone, please HOLD on the morning of the test. Patients who wear a continuous glucose monitor MUST remove the device prior to scanning. FEMALES- please wear underwire-free bra if available, avoid dresses & tight clothing       After the Test: Drink plenty of water . After receiving IV contrast, you may experience a mild flushed feeling. This is normal. On occasion, you may experience a mild rash up to 24 hours after the test. This is not dangerous. If this occurs, you can take Benadryl  25 mg, Zyrtec , Claritin , or Allegra  and increase your fluid intake. (Patients taking Tikosyn should avoid Benadryl , and may take  Zyrtec , Claritin , or Allegra ) If you experience trouble breathing, this can be serious. If it is severe call 911 IMMEDIATELY. If it is mild, please call our office.  We will call to schedule your test 2-4 weeks out understanding that some insurance companies will need an authorization prior to the service being performed.   For more information and frequently asked questions, please visit our website : [email or web address]  For non-scheduling related questions, please contact the cardiac imaging nurse navigator should you have any questions/concerns: Cardiac Imaging Nurse Navigators Direct Office Dial: [phone]   For scheduling needs, including cancellations and rescheduling, please call [place], [phone].

## 2024-02-13 NOTE — Progress Notes (Incomplete)
 Cardiology Office Note:    Date:  02/13/2024   ID:  [name], DOB 1945-07-04, MRN [id number]  PCP:  Gladis Mustard, FNP  Cardiologist:  None { Click to update primary MD,subspecialty MD or APP then REFRESH:1}    Referring MD: Severa Rock HERO, FNP   Chief Complaint: follow-up of recent monitor results  History of Present Illness:    [name] is a 78 y.o. female with a history of mild to moderate non-obstructive CAD noted on coronary CTA in 11/2021. hypertension, hyperlipidemia, small bowel obstruction, retroperitoneal fibrosis, chronic back pain, chronic constipation who is followed by Dr. Jeffrie who presents today for follow-up of recent monitor results.  Patient was referred to Dr. Jeffrie in 11/2021 for further evaluation of chest pain.  Coronary CTA was ordered and showed a coronary calcium  score of 606 (86 percentile for age and sex) with only mild to moderate atherosclerosis.  FFR did not demonstrate any hemodynamically flow-limiting lesions.  Echo was also ordered for further evaluation of her murmur noted on exam and showed LVEF of 60-65% with normal wall motion and moderate LVH of the basal septal segment, normal RV function, no significant valvular disease (only trivial MR).  She has not been seen by Cardiology since 2023.  PCP recently ordered a monitor in 12/2023 for further evaluation of diaphoresis which showed 91 short episodes of SVT (longest run 17 beats) as well as occasional PACs (2.9%) and rare PVCs (<1.0%).   Patient presents today for follow-up of this. She states it started with some type of infection but states they were not able to find a clear source. She reports diaphoresis when she gets up to do anything. She has had a thorough work-up by PCP and Infectious Disease including CBC, CMET, thyroid  panel, hormone panel, urinalysis/ urine cultures, and blood cultures. Urine culture grew Klebsiella but otherwise work-up unremarkable. This is when PCP  ordered the monitor as well. She denies any palpitations, lightheadedness/ dizziness, or syncope. She does describe some chest discomfort that she describes as a tightness/ squeezing/ pressure sensation that she only really notices at rest. She also reports mid dyspnea on exertion for the past 2 months which is new. No orthopnea or PND. She has chronic left lower extremity edema but states she has had that since she was in her 30s and it is stable.    EKGs/Labs/Other Studies Reviewed:    The following studies were reviewed:  Echocardiogram 12/14/2021: Impressions:  1. Left ventricular ejection fraction, by estimation, is 60 to 65%. The  left ventricle has normal function. The left ventricle has no regional  wall motion abnormalities. There is moderate left ventricular hypertrophy  of the basal-septal segment. Left  ventricular diastolic parameters were normal.   2. Right ventricular systolic function is normal. The right ventricular  size is normal. There is normal pulmonary artery systolic pressure. The  estimated right ventricular systolic pressure is 31.7 mmHg.   3. The mitral valve is normal in structure. Trivial mitral valve  regurgitation. No evidence of mitral stenosis.   4. The aortic valve is normal in structure. Aortic valve regurgitation is  not visualized. No aortic stenosis is present.   5. There is mild dilatation of the ascending aorta, measuring 38 mm.   6. The inferior vena cava is normal in size with greater than 50%  respiratory variability, suggesting right atrial pressure of 3 mmHg.  _______________  Coronary CTA 12/23/2021: Impression: 1. Coronary calcium  score of 606. This was 86th  percentile for age-, sex, and race-matched controls. 2.  Normal coronary origin with right dominance. 3.  Mild to moderate atherosclerosis.  CAD RADs 2. 4.  Recommend preventive therapy and risk factor modification. 5.  This study has been submitted for FFR  analysis. _______________  Monitor 12/15/2023 to 12/29/2023: Patient had a min HR of 33 bpm, max HR of 176 bpm, and avg HR of 64 bpm. Predominant underlying rhythm was Sinus Rhythm. 91 Supraventricular Tachycardia runs occurred, the run with the fastest interval lasting 10 beats with a max rate of 176 bpm, the  longest lasting 17 beats with an avg rate of 104 bpm. Isolated SVEs were occasional (2.9%, 63334), SVE Couplets were rare (<1.0%, 439), and SVE Triplets were rare (<1.0%, 93). Isolated VEs were rare (<1.0%), VE Couplets were rare (<1.0%), and no VE  Triplets were present.    EKG:  EKG ordered today.   EKG Interpretation Date/Time:  Tuesday February 13 2024 14:32:06 EDT Ventricular Rate:  76 PR Interval:  146 QRS Duration:  78 QT Interval:  370 QTC Calculation: 416 R Axis:   -9  Text Interpretation: Sinus rhythm with marked sinus arrhythmia Premature atrial complexes Cannot rule out Anterior infarct , age undetermined  No signfiicant changes compared to prior tracings Confirmed by Jadine Patient [phone]) on 02/13/2024 2:49:40 PM    Recent Labs: 12/15/2023: Hemoglobin 13.1; Platelets 270; TSH 0.764 01/17/2024: ALT 9; BUN 18; Creat 0.91; Potassium 4.3; Sodium 141  Recent Lipid Panel    Component Value Date/Time   CHOL 153 09/07/2023 1129   CHOL 154 11/13/2012 1018   TRIG 60 09/07/2023 1129   TRIG 86 11/13/2012 1018   HDL 89 09/07/2023 1129   HDL 57 11/13/2012 1018   CHOLHDL 1.7 09/07/2023 1129   LDLCALC 52 09/07/2023 1129   LDLCALC 80 11/13/2012 1018    Physical Exam:    Vital Signs: BP 129/81   Pulse 93   Wt 174 lb (78.9 kg)   SpO2 100%   BMI 32.88 kg/m     Wt Readings from Last 3 Encounters:  02/13/24 174 lb (78.9 kg)  01/17/24 172 lb (78 kg)  01/16/24 170 lb 6.4 oz (77.3 kg)     General: 78 y.o. Caucasian female in no acute distress. HEENT: Normocephalic and atraumatic. Sclera clear.  Neck: Supple. No JVD. Heart: RRR. Distinct S1 and S2. No murmurs, gallops,  or rubs.  Lungs: No increased work of breathing. Clear to ausculation bilaterally. No wheezes, rhonchi, or rales.   Extremities: Mild left lower extremity edema (chronic). No lower extremity edema.   Skin: Warm and dry. Neuro: No focal deficits. Psych: Normal affect. Responds appropriately.  Assessment:    1. Primary hypertension     Plan:    Diaphoresis Chest Pain Dyspnea on Exertion Mild to Moderate Non-Obstructive CAD Coronary CTA was ordered and showed a coronary calcium  score of 606 (86 percentile for age and sex) with only mild to moderate atherosclerosis.  FFR did not demonstrate any hemodynamically flow-limiting lesions.  Patient has had significant diaphoresis with exertion over the last few months. She has had extensive work-up by PCP and ID. Monitor in 12/2023 showed short runs of SVT but no significant arrhythmias. She also reports some new chest pressure (although mostly at rest) and mild shortness of breath.  - EKG shows no acute ischemic changes.  - Recommend starting Aspirin 81mg  daily. *** - Continue Lipitor 40mg  daily. - Will order Echo and coronary CTA.   Hypertension BP well controlled. *** -  Continue Lisinopril  20mg  daily.  - Also on Lasix  20mg  daily for ***  Hyperlipidemia Lipid panel in 09/2023: Total Cholesterol 143, Triglycerides 60, HDL 89, LDL 52. LDL goal <70.  - Continue Lipitor 40mg  daily. - Labs followed by PCP.  Disposition: Follow up in ***   Signed, Aline FORBES Door, PA-C  02/13/2024 3:11 PM    [facility]

## 2024-02-14 ENCOUNTER — Other Ambulatory Visit: Payer: Self-pay | Admitting: Nurse Practitioner

## 2024-02-14 ENCOUNTER — Encounter: Payer: Self-pay | Admitting: Student

## 2024-02-15 ENCOUNTER — Other Ambulatory Visit

## 2024-02-15 DIAGNOSIS — R072 Precordial pain: Secondary | ICD-10-CM | POA: Diagnosis not present

## 2024-02-15 DIAGNOSIS — R0602 Shortness of breath: Secondary | ICD-10-CM | POA: Diagnosis not present

## 2024-02-15 DIAGNOSIS — I10 Essential (primary) hypertension: Secondary | ICD-10-CM | POA: Diagnosis not present

## 2024-02-15 LAB — BASIC METABOLIC PANEL WITH GFR
BUN/Creatinine Ratio: 14 (ref 12–28)
BUN: 16 mg/dL (ref 8–27)
CO2: 21 mmol/L (ref 20–29)
Calcium: 9.3 mg/dL (ref 8.7–10.3)
Chloride: 100 mmol/L (ref 96–106)
Creatinine, Ser: 1.11 mg/dL — ABNORMAL HIGH (ref 0.57–1.00)
Glucose: 77 mg/dL (ref 70–99)
Potassium: 4.3 mmol/L (ref 3.5–5.2)
Sodium: 139 mmol/L (ref 134–144)
eGFR: 51 mL/min/1.73 — ABNORMAL LOW (ref >59)

## 2024-02-17 ENCOUNTER — Ambulatory Visit: Payer: Self-pay | Admitting: Student

## 2024-02-21 ENCOUNTER — Inpatient Hospital Stay (HOSPITAL_COMMUNITY)
Admission: EM | Admit: 2024-02-21 | Discharge: 2024-02-26 | DRG: 871 | Disposition: A | Discharge status: Home Health | Attending: Internal Medicine | Admitting: Internal Medicine

## 2024-02-21 ENCOUNTER — Other Ambulatory Visit: Payer: Self-pay

## 2024-02-21 ENCOUNTER — Encounter (HOSPITAL_COMMUNITY): Payer: Self-pay | Admitting: Emergency Medicine

## 2024-02-21 ENCOUNTER — Emergency Department (HOSPITAL_COMMUNITY)

## 2024-02-21 DIAGNOSIS — Z9841 Cataract extraction status, right eye: Secondary | ICD-10-CM | POA: Diagnosis not present

## 2024-02-21 DIAGNOSIS — D72829 Elevated white blood cell count, unspecified: Secondary | ICD-10-CM | POA: Diagnosis not present

## 2024-02-21 DIAGNOSIS — M5440 Lumbago with sciatica, unspecified side: Secondary | ICD-10-CM

## 2024-02-21 DIAGNOSIS — Z90722 Acquired absence of ovaries, bilateral: Secondary | ICD-10-CM

## 2024-02-21 DIAGNOSIS — G9341 Metabolic encephalopathy: Secondary | ICD-10-CM | POA: Diagnosis present

## 2024-02-21 DIAGNOSIS — Z1624 Resistance to multiple antibiotics: Secondary | ICD-10-CM | POA: Diagnosis present

## 2024-02-21 DIAGNOSIS — R6520 Severe sepsis without septic shock: Secondary | ICD-10-CM | POA: Diagnosis present

## 2024-02-21 DIAGNOSIS — E872 Acidosis, unspecified: Secondary | ICD-10-CM | POA: Diagnosis present

## 2024-02-21 DIAGNOSIS — E782 Mixed hyperlipidemia: Secondary | ICD-10-CM | POA: Diagnosis not present

## 2024-02-21 DIAGNOSIS — Z888 Allergy status to other drugs, medicaments and biological substances status: Secondary | ICD-10-CM

## 2024-02-21 DIAGNOSIS — I129 Hypertensive chronic kidney disease with stage 1 through stage 4 chronic kidney disease, or unspecified chronic kidney disease: Secondary | ICD-10-CM | POA: Diagnosis present

## 2024-02-21 DIAGNOSIS — Z1152 Encounter for screening for COVID-19: Secondary | ICD-10-CM | POA: Diagnosis not present

## 2024-02-21 DIAGNOSIS — Z8679 Personal history of other diseases of the circulatory system: Secondary | ICD-10-CM

## 2024-02-21 DIAGNOSIS — M545 Low back pain, unspecified: Secondary | ICD-10-CM | POA: Diagnosis present

## 2024-02-21 DIAGNOSIS — E785 Hyperlipidemia, unspecified: Secondary | ICD-10-CM | POA: Diagnosis present

## 2024-02-21 DIAGNOSIS — Z882 Allergy status to sulfonamides status: Secondary | ICD-10-CM

## 2024-02-21 DIAGNOSIS — Z860100 Personal history of colon polyps, unspecified: Secondary | ICD-10-CM

## 2024-02-21 DIAGNOSIS — Z8542 Personal history of malignant neoplasm of other parts of uterus: Secondary | ICD-10-CM

## 2024-02-21 DIAGNOSIS — R197 Diarrhea, unspecified: Secondary | ICD-10-CM | POA: Diagnosis present

## 2024-02-21 DIAGNOSIS — N3000 Acute cystitis without hematuria: Secondary | ICD-10-CM | POA: Diagnosis present

## 2024-02-21 DIAGNOSIS — Z91018 Allergy to other foods: Secondary | ICD-10-CM

## 2024-02-21 DIAGNOSIS — E538 Deficiency of other specified B group vitamins: Secondary | ICD-10-CM | POA: Diagnosis present

## 2024-02-21 DIAGNOSIS — Z9842 Cataract extraction status, left eye: Secondary | ICD-10-CM | POA: Diagnosis not present

## 2024-02-21 DIAGNOSIS — R531 Weakness: Secondary | ICD-10-CM | POA: Diagnosis not present

## 2024-02-21 DIAGNOSIS — Z961 Presence of intraocular lens: Secondary | ICD-10-CM | POA: Diagnosis present

## 2024-02-21 DIAGNOSIS — Z96643 Presence of artificial hip joint, bilateral: Secondary | ICD-10-CM | POA: Diagnosis present

## 2024-02-21 DIAGNOSIS — Z96651 Presence of right artificial knee joint: Secondary | ICD-10-CM | POA: Diagnosis present

## 2024-02-21 DIAGNOSIS — A419 Sepsis, unspecified organism: Principal | ICD-10-CM | POA: Diagnosis present

## 2024-02-21 DIAGNOSIS — B958 Unspecified staphylococcus as the cause of diseases classified elsewhere: Secondary | ICD-10-CM | POA: Diagnosis not present

## 2024-02-21 DIAGNOSIS — B952 Enterococcus as the cause of diseases classified elsewhere: Secondary | ICD-10-CM | POA: Diagnosis not present

## 2024-02-21 DIAGNOSIS — Z85828 Personal history of other malignant neoplasm of skin: Secondary | ICD-10-CM

## 2024-02-21 DIAGNOSIS — G8929 Other chronic pain: Secondary | ICD-10-CM | POA: Diagnosis present

## 2024-02-21 DIAGNOSIS — Z90710 Acquired absence of both cervix and uterus: Secondary | ICD-10-CM

## 2024-02-21 DIAGNOSIS — B961 Klebsiella pneumoniae [K. pneumoniae] as the cause of diseases classified elsewhere: Secondary | ICD-10-CM | POA: Diagnosis present

## 2024-02-21 DIAGNOSIS — R7881 Bacteremia: Secondary | ICD-10-CM | POA: Diagnosis not present

## 2024-02-21 DIAGNOSIS — E876 Hypokalemia: Secondary | ICD-10-CM | POA: Diagnosis present

## 2024-02-21 DIAGNOSIS — Z87440 Personal history of urinary (tract) infections: Secondary | ICD-10-CM

## 2024-02-21 DIAGNOSIS — A020 Salmonella enteritis: Secondary | ICD-10-CM | POA: Diagnosis present

## 2024-02-21 DIAGNOSIS — B957 Other staphylococcus as the cause of diseases classified elsewhere: Secondary | ICD-10-CM | POA: Diagnosis present

## 2024-02-21 DIAGNOSIS — Z8249 Family history of ischemic heart disease and other diseases of the circulatory system: Secondary | ICD-10-CM | POA: Diagnosis not present

## 2024-02-21 DIAGNOSIS — G9349 Other encephalopathy: Secondary | ICD-10-CM

## 2024-02-21 DIAGNOSIS — Z79899 Other long term (current) drug therapy: Secondary | ICD-10-CM

## 2024-02-21 LAB — COMPREHENSIVE METABOLIC PANEL WITH GFR
ALT: 16 U/L (ref 0–44)
AST: 29 U/L (ref 15–41)
Albumin: 4.3 g/dL (ref 3.5–5.0)
Alkaline Phosphatase: 58 U/L (ref 38–126)
Anion gap: 15 (ref 5–15)
BUN: 16 mg/dL (ref 8–23)
CO2: 22 mmol/L (ref 22–32)
Calcium: 9.6 mg/dL (ref 8.9–10.3)
Chloride: 101 mmol/L (ref 98–111)
Creatinine, Ser: 1.08 mg/dL — ABNORMAL HIGH (ref 0.44–1.00)
GFR, Estimated: 52 mL/min — ABNORMAL LOW (ref >60)
Glucose, Bld: 114 mg/dL — ABNORMAL HIGH (ref 70–99)
Potassium: 4 mmol/L (ref 3.5–5.1)
Sodium: 138 mmol/L (ref 135–145)
Total Bilirubin: 1.1 mg/dL (ref 0.0–1.2)
Total Protein: 7 g/dL (ref 6.5–8.1)

## 2024-02-21 LAB — I-STAT CG4 LACTIC ACID, ED
Lactic Acid, Venous: 1.9 mmol/L (ref 0.5–1.9)
Lactic Acid, Venous: 3 mmol/L (ref 0.5–1.9)

## 2024-02-21 LAB — RESP PANEL BY RT-PCR (RSV, FLU A&B, COVID)  RVPGX2
Influenza A by PCR: NEGATIVE
Influenza B by PCR: NEGATIVE
Resp Syncytial Virus by PCR: NEGATIVE
SARS Coronavirus 2 by RT PCR: NEGATIVE

## 2024-02-21 LAB — URINALYSIS, W/ REFLEX TO CULTURE (INFECTION SUSPECTED)
Bilirubin Urine: NEGATIVE
Glucose, UA: NEGATIVE mg/dL
Ketones, ur: 5 mg/dL — AB
Nitrite: POSITIVE — AB
Protein, ur: 30 mg/dL — AB
Specific Gravity, Urine: 1.019 (ref 1.005–1.030)
pH: 5 (ref 5.0–8.0)

## 2024-02-21 LAB — CBC WITH DIFFERENTIAL/PLATELET
Abs Immature Granulocytes: 0.14 K/uL — ABNORMAL HIGH (ref 0.00–0.07)
Basophils Absolute: 0 K/uL (ref 0.0–0.1)
Basophils Relative: 0 %
Eosinophils Absolute: 0 K/uL (ref 0.0–0.5)
Eosinophils Relative: 0 %
HCT: 44.6 % (ref 36.0–46.0)
Hemoglobin: 14.1 g/dL (ref 12.0–15.0)
Immature Granulocytes: 1 %
Lymphocytes Relative: 7 %
Lymphs Abs: 1.3 K/uL (ref 0.7–4.0)
MCH: 31.3 pg (ref 26.0–34.0)
MCHC: 31.6 g/dL (ref 30.0–36.0)
MCV: 99.1 fL (ref 80.0–100.0)
Monocytes Absolute: 0.9 K/uL (ref 0.1–1.0)
Monocytes Relative: 5 %
Neutro Abs: 17.2 K/uL — ABNORMAL HIGH (ref 1.7–7.7)
Neutrophils Relative %: 87 %
Platelets: 278 K/uL (ref 150–400)
RBC: 4.5 MIL/uL (ref 3.87–5.11)
RDW: 12.5 % (ref 11.5–15.5)
WBC: 19.5 K/uL — ABNORMAL HIGH (ref 4.0–10.5)
nRBC: 0 % (ref 0.0–0.2)

## 2024-02-21 LAB — URINE DRUG SCREEN
Amphetamines: NEGATIVE
Barbiturates: NEGATIVE
Benzodiazepines: NEGATIVE
Cocaine: NEGATIVE
Fentanyl: NEGATIVE
Methadone Scn, Ur: NEGATIVE
Opiates: NEGATIVE
Tetrahydrocannabinol: NEGATIVE

## 2024-02-21 MED ORDER — LACTATED RINGERS IV SOLN: INTRAVENOUS | Status: AC

## 2024-02-21 MED ORDER — LACTATED RINGERS IV BOLUS (SEPSIS)
500.0000 mL | Freq: Once | INTRAVENOUS | Status: AC
  Administered 2024-02-21: 500 mL via INTRAVENOUS

## 2024-02-21 MED ORDER — MELATONIN 3 MG PO TABS: 3.0000 mg | ORAL_TABLET | Freq: Every evening | ORAL | Status: DC | PRN

## 2024-02-21 MED ORDER — SODIUM CHLORIDE 0.9 % IV SOLN
1.0000 g | INTRAVENOUS | Status: DC
  Administered 2024-02-22 – 2024-02-23 (×2): 1 g via INTRAVENOUS
  Filled 2024-02-21 (×2): qty 10

## 2024-02-21 MED ORDER — ONDANSETRON HCL 4 MG/2ML IJ SOLN: 4.0000 mg | Freq: Four times a day (QID) | INTRAMUSCULAR | Status: DC | PRN

## 2024-02-21 MED ORDER — SODIUM CHLORIDE 0.9 % IV SOLN
2.0000 g | Freq: Once | INTRAVENOUS | Status: AC
  Administered 2024-02-21: 2 g via INTRAVENOUS
  Filled 2024-02-21: qty 20

## 2024-02-21 MED ORDER — LACTATED RINGERS IV BOLUS (SEPSIS)
1000.0000 mL | Freq: Once | INTRAVENOUS | Status: AC
  Administered 2024-02-21: 1000 mL via INTRAVENOUS

## 2024-02-21 MED ORDER — ACETAMINOPHEN 325 MG PO TABS
650.0000 mg | ORAL_TABLET | Freq: Four times a day (QID) | ORAL | Status: DC | PRN
  Administered 2024-02-21: 650 mg via ORAL
  Filled 2024-02-21: qty 2

## 2024-02-21 MED ORDER — ACETAMINOPHEN 650 MG RE SUPP: 650.0000 mg | Freq: Four times a day (QID) | RECTAL | Status: DC | PRN

## 2024-02-21 MED ORDER — ATORVASTATIN CALCIUM 40 MG PO TABS
40.0000 mg | ORAL_TABLET | Freq: Every day | ORAL | Status: DC
  Administered 2024-02-22 – 2024-02-26 (×5): 40 mg via ORAL
  Filled 2024-02-21 (×5): qty 1

## 2024-02-21 NOTE — ED Provider Notes (Signed)
 [place] EMERGENCY DEPARTMENT AT [facility] Provider Note   CSN: [id number] Arrival date & time: 02/21/24  1622     Patient presents with: Weakness   [name] is a 78 y.o. female with past medical history of endometrial cancer (remission 2013), HTN, HLD presents to Emergency Department with son and daughter-in-law for evaluation of AMS and lethargy.  Daughter-in-law reports that patient has become increasingly tired over the past 2 weeks.  No other complaints to family members.  Has a history of frequent UTIs and wears depends at baseline.  Last BM yesterday normal.  Family reports she has been drinking water  at home.  No complaints of cough, congestion, fevers, NVD at home  Today, sought ED evaluation for patient when she stopped in the check on her and patient was very tired and responded to verbal stimuli only.  Her baseline is A&Ox3 and she is normally able to perform her own ADLs.    Weakness      Prior to Admission medications   Medication Sig Start Date End Date Taking? Authorizing Provider  albuterol  (VENTOLIN  HFA) 108 (90 Base) MCG/ACT inhaler Inhale 2 puffs into the lungs every 6 (six) hours as needed for wheezing or shortness of breath. 09/28/23   Rakes, Rock HERO, FNP  atorvastatin  (LIPITOR) 40 MG tablet Take 1 tablet (40 mg total) by mouth daily. 03/07/23   Gladis Mustard, FNP  conjugated estrogens  (PREMARIN ) vaginal cream Apply 0.5 mg intravaginally once daily x 2 weeks and then twice a week 07/11/23   Fleeta Rothman, Jomarie SAILOR, MD  doxycycline  (VIBRA -TABS) 100 MG tablet Take 1 tablet (100 mg total) by mouth 2 (two) times daily. 1 po bid 12/29/23   Dettinger, Joshua A, MD  fosfomycin (MONUROL ) 3 g PACK TAKE 3 GRAMS BY MOUTH ONCE FOR 1 DOSE 01/18/24   Gladis, Mary-Margaret, FNP  furosemide  (LASIX ) 40 MG tablet Take 40 mg by mouth daily.    [provider]  lisinopril  (ZESTRIL ) 20 MG tablet Take 1 tablet by mouth once daily 01/31/24   Gladis Mustard, FNP  meloxicam  (MOBIC ) 7.5 MG tablet Take 1 tablet by mouth once daily 09/01/23   Gladis Mustard, FNP  methocarbamol  (ROBAXIN ) 500 MG tablet Take 1 tablet (500 mg total) by mouth at bedtime. 09/08/23   Gladis Mustard, FNP  metoprolol  tartrate (LOPRESSOR ) 25 MG tablet Take 1 tablet (25 mg total) by mouth once for 1 dose. Take 2 hours prior to Cardiac CT if heart rate is above 65 02/13/24 02/13/24  Goodrich, Callie E, PA-C  mupirocin  ointment (BACTROBAN ) 2 % Apply 1 Application topically as needed (for iritation).    [provider]  nystatin  (MYCOSTATIN /NYSTOP ) powder Apply 1 Application topically 3 (three) times daily. 11/20/23   Gladis Mustard, FNP  nystatin  cream (MYCOSTATIN ) APPLY TOPICALLY AS NEEDED FOR DRY SKIN 11/14/23   Gladis Mustard, FNP  oxyCODONE -acetaminophen  (PERCOCET) 7.5-325 MG tablet Take 1 tablet by mouth every 8 (eight) hours as needed for severe pain (pain score 7-10). 02/05/24 03/06/24  Gladis Mustard, FNP  pantoprazole  (PROTONIX ) 40 MG tablet TAKE 1 TABLET BY MOUTH ONCE DAILY BEFORE SUPPER FOR STOMACH PAIN 02/14/24   Gladis Mustard, FNP  tizanidine  (ZANAFLEX ) 2 MG capsule TAKE 1 CAPSULE BY MOUTH THREE TIMES DAILY 01/05/24   Gladis Mustard, FNP    Allergies: Cashew nut oil, Dog epithelium, and Sulfa antibiotics    Review of Systems  Neurological:  Positive for weakness.    Updated Vital Signs BP 106/71   Pulse ROLLEN)  104   Temp 98.6 F (37 C) (Oral)   Resp 20   SpO2 91%   Physical Exam Vitals and nursing note reviewed.  Constitutional:      General: She is not in acute distress.    Comments: Eyes closed and responsive to verbal stimuli  HENT:     Head: Normocephalic and atraumatic.  Eyes:     Conjunctiva/sclera: Conjunctivae normal.  Cardiovascular:     Rate and Rhythm: Normal rate.  Pulmonary:     Effort: Pulmonary effort is normal. No respiratory distress.  Skin:    Coloration: Skin is not  jaundiced or pale.  Neurological:     Mental Status: Mental status is at baseline.     (all labs ordered are listed, but only abnormal results are displayed) Labs Reviewed  COMPREHENSIVE METABOLIC PANEL WITH GFR - Abnormal; Notable for the following components:      Result Value   Glucose, Bld 114 (*)    Creatinine, Ser 1.08 (*)    GFR, Estimated 52 (*)    All other components within normal limits  CBC WITH DIFFERENTIAL/PLATELET - Abnormal; Notable for the following components:   WBC 19.5 (*)    Neutro Abs 17.2 (*)    Abs Immature Granulocytes 0.14 (*)    All other components within normal limits  I-STAT CG4 LACTIC ACID, ED - Abnormal; Notable for the following components:   Lactic Acid, Venous 3.0 (*)    All other components within normal limits  RESP PANEL BY RT-PCR (RSV, FLU A&B, COVID)  RVPGX2  URINE CULTURE  CULTURE, BLOOD (ROUTINE X 2)  CULTURE, BLOOD (ROUTINE X 2)  URINE DRUG SCREEN  URINALYSIS, W/ REFLEX TO CULTURE (INFECTION SUSPECTED)  I-STAT CG4 LACTIC ACID, ED    EKG: EKG Interpretation Date/Time:  Wednesday February 21 2024 19:11:33 EDT Ventricular Rate:  102 PR Interval:  137 QRS Duration:  89 QT Interval:  314 QTC Calculation: 409 R Axis:   -17  Text Interpretation: Sinus tachycardia Confirmed by Cottie Cough [id number]) on 02/21/2024 7:21:14 PM  Radiology: CT Head Wo Contrast Result Date: 02/21/2024 CLINICAL DATA:  Altered mental status. EXAM: CT HEAD WITHOUT CONTRAST TECHNIQUE: Contiguous axial images were obtained from the base of the skull through the vertex without intravenous contrast. RADIATION DOSE REDUCTION: This exam was performed according to the departmental dose-optimization program which includes automated exposure control, adjustment of the mA and/or kV according to patient size and/or use of iterative reconstruction technique. COMPARISON:  None Available. FINDINGS: Brain: There is generalized cerebral atrophy with widening of the extra-axial  spaces and ventricular dilatation. There are areas of decreased attenuation within the white matter tracts of the supratentorial brain, consistent with microvascular disease changes. Vascular: No hyperdense vessel or unexpected calcification. Skull: Normal. Negative for fracture or focal lesion. Sinuses/Orbits: There is mild posterior left maxillary sinus mucosal thickening. Other: None. IMPRESSION: 1. Generalized cerebral atrophy and microvascular disease changes of the supratentorial brain. 2. No acute intracranial abnormality. 3. Mild left maxillary sinus disease. Electronically Signed   By: Suzen Dials M.D.   On: 02/21/2024 19:11   DG Chest 2 View Result Date: 02/21/2024 CLINICAL DATA:  Weakness EXAM: CHEST - 2 VIEW COMPARISON:  09/07/2023 FINDINGS: The heart size and mediastinal contours are within normal limits. Both lungs are clear. The visualized skeletal structures are unremarkable. IMPRESSION: No active cardiopulmonary disease. Electronically Signed   By: Franky Crease M.D.   On: 02/21/2024 17:34     .Critical Care  Performed by:  Minnie Tinnie BRAVO, PA Authorized by: Minnie Tinnie BRAVO, PA   Critical care provider statement:    Critical care time (minutes):  38   Critical care was necessary to treat or prevent imminent or life-threatening deterioration of the following conditions:  Sepsis   Critical care was time spent personally by me on the following activities:  Blood draw for specimens, development of treatment plan with patient or surrogate, evaluation of patient's response to treatment, examination of patient, obtaining history from patient or surrogate, ordering and performing treatments and interventions, ordering and review of laboratory studies, ordering and review of radiographic studies, pulse oximetry and re-evaluation of patient's condition   Care discussed with: admitting provider      Medications Ordered in the ED  lactated ringers  infusion ( Intravenous New Bag/Given  02/21/24 2059)  lactated ringers  bolus 1,000 mL (1,000 mLs Intravenous New Bag/Given 02/21/24 2010)    And  lactated ringers  bolus 1,000 mL (has no administration in time range)    And  lactated ringers  bolus 500 mL (has no administration in time range)  cefTRIAXone  (ROCEPHIN ) 2 g in sodium chloride  0.9 % 100 mL IVPB (2 g Intravenous New Bag/Given 02/21/24 2030)    Clinical Course as of 02/21/24 2140  Wed Feb 21, 2024  2112 Nitrite(!): POSITIVE [MT]  1669 78 year old female presented to ED with complaint for confusion, worsening of the past 24 hours.  History of recurring UTIs.  Patient arrived tachycardic, elevated white blood cell count, normal lactate initially.  There was concern for potential urosepsis with UA that appears infected.  Patient has been given Rocephin , currently receiving her IV fluid bolus (please note she did NOT receive this before her repeat lactate had been drawn).  Plan to admit to the hospital. [MT]    Clinical Course User Index [MT] Trifan, Donnice PARAS, MD                                 Medical Decision Making Amount and/or Complexity of Data Reviewed Labs: ordered. Decision-making details documented in ED Course. Radiology: ordered.  Risk Prescription drug management. Decision regarding hospitalization.   Patient presents to the ED for concern of AMS, fatigue, this involves an extensive number of treatment options, and is a complaint that carries with it a high risk of complications and morbidity.  The differential diagnosis includes sepsis, UTI, ICH, electrolyte abnormality, dehydration, PNA, bacteremia. Not an exhaustive list    Co morbidities that complicate the patient evaluation  See HPI   Additional history obtained:  [facility]   External records from outside source obtained and reviewed including triage RN note   Lab Tests:  I Ordered, and personally interpreted labs.  The pertinent results  include:   WBC 19.5 neutrophils 17.2 Creatinine 1.08 CBG 114 Respiratory panel negative Lactic initially 1.9 that increased to 3 UDS negative BC pending UA + for UTI Urine culture pending   Imaging Studies ordered:  I ordered imaging studies including chest x-ray, CT head I independently visualized and interpreted imaging which showed  No pneumonia No ICH I agree with the radiologist interpretation   Cardiac Monitoring:  The patient was maintained on a cardiac monitor.  I personally viewed and interpreted the cardiac monitored which showed an underlying rhythm of: Sinus tachycardia at 1 to 2 bpm   Medicines ordered and prescription drug management:  I ordered medication including Rocephin , LR for  suspected UTI, sepsis Reevaluation of the patient after these medicines showed that the patient stayed the same I have reviewed the patients home medicines and have made adjustments as needed    Critical Interventions:  Sepsis   Consultations Obtained:  I requested consultation with hospitalist,  and discussed lab and imaging findings as well as pertinent plan - Dr. Marcene accepts pt for admission    Problem List / ED Course:  AMS A&Ox2 and normally A&Ox3 per family. Confusion to current yr CBG WNL UDS negative.  Does not appear to be drug toxidrome causing AMS, acute encephalopathy CT head without ICH EKG sinus tachycardia with no ST nor T wave abnormalities and similar to previous  Lethargy Sepsis Initially, patient presented with mild tachycardia.  No fever.  Did have increased fatigue but no complaints herself Lab work showed leukocytosis of 19.5 with normal lactic acid.  Sought to find source of infection.  Chest x-ray without pneumonia so UTI was likely source..  I did put in and out catheterization at arrival to emergency department as she is chronically incontinent with urine.  Multiple attempts were made to nursing staff to obtain UA with In-N-Out over 2  hours via secure chat and verbal request. Second lactic acid obtained increased from 1.9-3.  Code sepsis was called.  LR bolus administered.  Rocephin  provided for suspected UTI although culture and urine have not returned yet. No signs of cellulitis or skin infection Family denies NVD, patient complain at home No obvious abdominal tenderness on exam.  Low suspicion for intra-abdominal infection UA definitely positive for UTI and likely source of infection, acute encephalopathy secondary to infection Do have third lactic acid scheduled as second lactic acid was elevated to ensure decreased value following IVF BC and urine culture pending   Reevaluation:  After the interventions noted above, I reevaluated the patient and found that they have :stayed the same    Dispostion:  After consideration of the diagnostic results and the patients response to treatment, I feel that the patent would benefit from admission for IV antibiotics, sepsis, acute encephalopathy secondary to infection.   Discussed ED workup, disposition with patient and patient's family who expresses understanding agrees with plan.  All questions answered to their satisfaction.  They are agreeable to plan.   Final diagnoses:  Sepsis, due to unspecified organism, unspecified whether acute organ dysfunction present [facility])  Acute encephalopathy due to infection    ED Discharge Orders     None        Minnie Tinnie BRAVO, PA 02/21/24 2146    Cottie Donnice PARAS, MD 02/22/24 0010

## 2024-02-21 NOTE — Sepsis Progress Note (Signed)
eLink following Code Sepsis. 

## 2024-02-21 NOTE — ED Notes (Signed)
 Pt to CT

## 2024-02-21 NOTE — ED Triage Notes (Signed)
 Per family pt has been lethargic and not acting right since 1000 this morning. Denies pain.

## 2024-02-21 NOTE — ED Notes (Signed)
 Critical Result - Lac 2.96 - Reported to Dr. Cottie

## 2024-02-21 NOTE — Sepsis Progress Note (Signed)
 Notified provider  (PA) of need to order repeat lactic acid (#3).

## 2024-02-21 NOTE — H&P (Signed)
 History and Physical      [name] FMW:[id number] DOB: 1945-12-03 DOA: 02/21/2024; DOS: 02/21/2024  PCP: Gladis Mustard, FNP  Patient coming from: home   I have personally briefly reviewed patient's old medical records in [facility]  Chief Complaint: altered mental status  HPI: [name] is a 78 y.o. female with medical history significant for chronic low back pain, essential hypertension, hyperlipidemia, who is admitted to [facility] on 02/21/2024 with acute metabolic encephalopathy due to severe sepsis secondary to acute cystitis after presenting from home to [facility] ED for evaluation of altered mental status.  In the setting of the patient's altered mental status, the following history is provided by the patient's family as well as Whitney discussions with the EDP and via chart review.  Patient's family have noted the patient to be confused relative to her baseline mental status over the course of the last 2 days.  This has been associate with increased lethargy, somnolence, as well as generalized weakness in the absence of any acute focal weakness.  Family  notes that over the course the last 1 to 2 days, the patient has not been oriented to time, conveying that at baseline, the patient is able to correctly identify the current year.   She is noted to have a history of previous urinary tract infections, family reporting that the constellation of symptoms with which she presents this evening are very similar to that which she has experienced with previous urinary tract infections.  She has a history of chronic low back pain, with outpatient medications notable for as needed Percocet, scheduled Zanaflex , in addition to scheduled meloxicam .     ED Course:  Vital signs in the ED were notable for the following: Afebrile; initial heart rates in the 120s, associated with sinus tachycardia, with heart rate improving into the 80s to 90s following interval IV  fluids, as further quantified below; systolic blood pressures initially in the 90s to low 100s, subsequently increasing in the 1 teens to 120s following interval IV fluids as well as initiation of antibiotics, as below; respiratory rate 16-27, oxygen saturation 91 to 90% on room air.  Labs were notable for the following: CMP was notable for the following: Sodium 130, bicarbonate 22, creatinine 1.08 compared to 1.11 on 02/15/2024, glucose 114, liver enzymes were within normal limits.  CBC notable for white blood cell count of 19,500 with 87% neutrophils, hemoglobin 14.1, platelet count 278.  Urinalysis was associated with a hazy appearing specimen and notable for 21-50 red blood cells, large leukocyte esterase, nitrate positive, many bacteria, no evidence of succumbs epithelial cells, while showing small hemoglobin in the absence of any RBCs while demonstrating 5 ketones.  Urinary tract he was pan negative.  Blood cultures x 2 as well as urine culture were collected prior to initiation of IV antibiotics.  COVID, influenza, RSV PCR were all negative.  Per Whitney interpretation, EKG in ED demonstrated the following: Sinus tachycardia with heart rate 102, normal intervals, no evidence of T wave or ST changes, Cleen evidence of ST elevation.  Imaging in the ED, per corresponding formal radiology read, was notable for the following: Noncontrast CT head showed no evidence of acute intracranial process, including no evidence of intracranial Atriance of acute infarct.  2 view chest x-ray showed no evidence of acute cardiopulmonary process, including no evidence of infiltrate, edema, effusion, or pneumothorax.  While in the ED, the following were administered: Rocephin , lactated Ringer 's x 2.5 L bolus followed  by initiation of continuous lactated Ringer 's at 150 cc/h.  Subsequently, the patient was admitted for further evaluation management of presenting acute metabolic encephalopathy in the setting of severe sepsis due  to acute cystitis, with presentation also notable for generalized weakness.    Review of Systems: As per HPI otherwise 10 point review of systems negative.   Past Medical History:  Diagnosis Date   Arthritis    Bacteriuria 07/10/2023   Cancer (HCC)    ovarian,skin Ca. in mouth   Cataract    Bil cataracts removed   Chronic constipation    Chronic kidney disease    Chronic midline low back pain with sciatica    Colon polyp    large ascending colon polyp , scheduled for resection 02/ 2021   Full dentures    History of endometrial cancer 2012   s/p   TAH w/ BSO 11-02-2010   History of recurrent UTIs    History of sepsis    multiple times (some due to e.coli) last sepsis 07/ 2019   History of small bowel obstruction    x2  ,  hx bowel resection   HOH (hard of hearing)    Hyperlipidemia    Hypertension    Lower urinary tract symptoms (LUTS) 07/10/2023   Retroperitoneal fibrosis    Substance abuse (HCC)    opiod dependancy   Ureteral obstruction, left urologist-- dr renda   secondary to adhesions/ retroperitoneal fibrosis, treated with ureteral stent   Vertigo    Wears glasses     Past Surgical History:  Procedure Laterality Date   ABDOMINAL HYSTERECTOMY  11/02/10  dr dodie  @[facility]    TAH, BSO, incisional hernia repair , lysis of adhesions for endometrial cancer   ANTERIOR LUMBAR FUSION  01-28-2008   dr elsner  @[facility]    L2 -- L4   BACK SURGERY     BUNIONECTOMY Right 2003   CATARACT EXTRACTION W/ INTRAOCULAR LENS  IMPLANT, BILATERAL  2007   COLONOSCOPY  last one 12-24-2018   CYSTO/  LEFT RETROGRADE PYELOGRAM/ URETEROSCOPY STENT PLACEMENT/  OPEN URETEROLYSIS WITH OMENTAL FLAP Left 03-11-2009    dr borden  @[facility]    CYSTO/  [facility] SLING/  ANTERIOR REPAIR  12-06-2001    dr watt @[facility]    CYSTOSCOPY W/ URETERAL STENT PLACEMENT Left 12/07/2017   Procedure: CYSTOSCOPY WITH RETROGRADE PYELOGRAM/URETERAL STENT PLACEMENT;  Surgeon: renda Glance, MD;  Location: [facility];  Service: Urology;   Laterality: Left;   CYSTOSCOPY W/ URETERAL STENT PLACEMENT Left 03/29/2018   Procedure: CYSTOSCOPY WITH STENT  EXCHANGE;  Surgeon: renda Glance, MD;  Location: [facility];  Service: Urology;  Laterality: Left;   CYSTOSCOPY WITH RETROGRADE PYELOGRAM, URETEROSCOPY AND STENT PLACEMENT Left 12-29-2008   dr renda  @[facility]    WITH BALLOON DILATION LEFT URETERAL STRICTURE   CYSTOSCOPY WITH RETROGRADE PYELOGRAM, URETEROSCOPY AND STENT PLACEMENT Left 09-12-2008    dr watt @[facility]    CYSTOSCOPY WITH RETROGRADE PYELOGRAM, URETEROSCOPY AND STENT PLACEMENT Left 03/23/2020   Procedure: CYSTOSCOPY WITH LEFT RETROGRADE PYELOGRAM, AND LEFT STENT REMOVAL;  Surgeon: renda Glance, MD;  Location: [facility];  Service: Urology;  Laterality: Left;   CYSTOSCOPY WITH STENT PLACEMENT Left 07/26/2018   Procedure: CYSTOSCOPY WITH STENT CHANGE;  Surgeon: renda Glance, MD;  Location: [facility];  Service: Urology;  Laterality: Left;   CYSTOSCOPY WITH STENT PLACEMENT Left 12/03/2018   Procedure: CYSTOSCOPY WITH STENT CHANGE;  Surgeon: renda Glance, MD;  Location: [facility];  Service: Urology;  Laterality: Left;   CYSTOSCOPY WITH STENT  PLACEMENT Left 04/22/2019   Procedure: CYSTOSCOPY WITH STENT CHANGE;  Surgeon: Renda Glance, MD;  Location: [facility];  Service: Urology;  Laterality: Left;   CYSTOSCOPY WITH STENT PLACEMENT Left 10/03/2019   Procedure: CYSTOSCOPY WITH STENT CHANGE;  Surgeon: Renda Glance, MD;  Location: [facility];  Service: Urology;  Laterality: Left;   EYE SURGERY     FOOT SURGERY Left 06-23-2010   dr vickye   left first and second toes   INSERTION OF MESH N/A 04/23/2013   Procedure: INSERTION OF MESH;  Surgeon: Elspeth KYM Schultze, MD;  Location: [facility];  Service: General;  Laterality: N/A;   JOINT REPLACEMENT     KNEE ARTHROSCOPY Right 04-27-2007  dr amy @[facility]    LAPAROSCOPIC LYSIS OF ADHESIONS N/A 04/23/2013   Procedure: LAPAROSCOPIC LYSIS OF ADHESIONS;  Surgeon: Elspeth KYM Schultze, MD;  Location: [facility];  Service: General;  Laterality: N/A;   LUMBAR SPINE SURGERY  1989 and 1999   POSTERIOR LUMBAR FUSION  12/17/2012   L3 -- L5 laminectomy and L3 -- 5 fusion   TOTAL HIP ARTHROPLASTY Left 07/04/2014   Procedure: LEFT TOTAL HIP ARTHROPLASTY ANTERIOR APPROACH;  Surgeon: Dempsey Melodi GAILS, MD;  Location: [facility];  Service: Orthopedics;  Laterality: Left;   TOTAL HIP ARTHROPLASTY Right 2009   TOTAL KNEE ARTHROPLASTY Right 09-15-2009   dr amy @[facility]    VENTRAL HERNIA REPAIR N/A 07/19/2012   Procedure: LAPAROSCOPIC LYSIS OF ADHESIONS, SMALL BOWEL RESECTION, SEROSAL REPAIR, PRIMARY VENTRAL HERNIA REPAIR;  Surgeon: Elspeth KYM Schultze, MD;  Location: [facility];  Service: General;  Laterality: N/A;   VENTRAL HERNIA REPAIR N/A 04/23/2013   Procedure: LAPAROSCOPIC exploration and repair of hernia in abdominal VENTRAL wall  HERNIA;  Surgeon: Elspeth KYM Schultze, MD;  Location: [facility];  Service: General;  Laterality: N/A;    Social History:  reports that she has never smoked. She has never used smokeless tobacco. She reports that she does not drink alcohol and does not use drugs.   Allergies  Allergen Reactions   Cashew Nut Oil Hives   Dog Epithelium Hives and Itching   Sulfa Antibiotics Other (See Comments)    blistering lips and mouth sores    Family History  Problem Relation Age of Onset   Diabetes Mother    Lung cancer Mother    Diabetes Father    Liver cancer Father    Colon cancer Sister        close to 40 per pt   Diabetes Sister    Diabetes Sister    Hypertension Sister    Kidney disease Sister    Seizures Daughter    Diabetes Son    Breast cancer Neg Hx    Allergic rhinitis Neg Hx    Angioedema Neg Hx    Asthma Neg Hx    Atopy Neg Hx    Eczema Neg Hx    Immunodeficiency Neg Hx    Urticaria Neg Hx    Colon polyps Neg Hx    Esophageal cancer Neg Hx    Rectal cancer Neg Hx    Stomach cancer Neg Hx     Family history reviewed and not pertinent    Prior to Admission medications    Medication Sig Start Date End Date Taking? Authorizing Provider  albuterol  (VENTOLIN  HFA) 108 (90 Base) MCG/ACT inhaler Inhale 2 puffs into the lungs every 6 (six) hours as needed for wheezing or shortness of breath. 09/28/23   Rakes, Rock HERO, FNP  atorvastatin  (  LIPITOR) 40 MG tablet Take 1 tablet (40 mg total) by mouth daily. 03/07/23   Gladis Mustard, FNP  conjugated estrogens  (PREMARIN ) vaginal cream Apply 0.5 mg intravaginally once daily x 2 weeks and then twice a week 07/11/23   Fleeta Rothman, Jomarie SAILOR, MD  doxycycline  (VIBRA -TABS) 100 MG tablet Take 1 tablet (100 mg total) by mouth 2 (two) times daily. 1 po bid 12/29/23   Dettinger, Joshua A, MD  fosfomycin (MONUROL ) 3 g PACK TAKE 3 GRAMS BY MOUTH ONCE FOR 1 DOSE 01/18/24   Gladis, Mary-Margaret, FNP  furosemide  (LASIX ) 40 MG tablet Take 40 mg by mouth daily.    [provider]  lisinopril  (ZESTRIL ) 20 MG tablet Take 1 tablet by mouth once daily 01/31/24   Gladis Mustard, FNP  meloxicam  (MOBIC ) 7.5 MG tablet Take 1 tablet by mouth once daily 09/01/23   Gladis Mustard, FNP  methocarbamol  (ROBAXIN ) 500 MG tablet Take 1 tablet (500 mg total) by mouth at bedtime. 09/08/23   Gladis Mustard, FNP  metoprolol  tartrate (LOPRESSOR ) 25 MG tablet Take 1 tablet (25 mg total) by mouth once for 1 dose. Take 2 hours prior to Cardiac CT if heart rate is above 65 02/13/24 02/13/24  Goodrich, Callie E, PA-C  mupirocin  ointment (BACTROBAN ) 2 % Apply 1 Application topically as needed (for iritation).    [provider]  nystatin  (MYCOSTATIN /NYSTOP ) powder Apply 1 Application topically 3 (three) times daily. 11/20/23   Gladis Mustard, FNP  nystatin  cream (MYCOSTATIN ) APPLY TOPICALLY AS NEEDED FOR DRY SKIN 11/14/23   Gladis Mustard, FNP  oxyCODONE -acetaminophen  (PERCOCET) 7.5-325 MG tablet Take 1 tablet by mouth every 8 (eight) hours as needed for severe pain (pain score 7-10). 02/05/24 03/06/24  Gladis Mustard, FNP   pantoprazole  (PROTONIX ) 40 MG tablet TAKE 1 TABLET BY MOUTH ONCE DAILY BEFORE SUPPER FOR STOMACH PAIN 02/14/24   Gladis Mustard, FNP  tizanidine  (ZANAFLEX ) 2 MG capsule TAKE 1 CAPSULE BY MOUTH THREE TIMES DAILY 01/05/24   Gladis Mustard, FNP     Objective    Physical Exam: Vitals:   02/21/24 1830 02/21/24 1845 02/21/24 1900 02/21/24 1915  BP:   108/61 106/71  Pulse: (!) 101 (!) 104  (!) 104  Resp:    20  Temp:      TempSrc:      SpO2: 92% 94%  91%    General: appears to be stated age; confused Skin: warm, dry, no rash Head:  AT/[place] Mouth:  Oral mucosa membranes appear moist, normal dentition Neck: supple; trachea midline Heart:  RRR; did not appreciate any M/R/G Lungs: CTAB, did not appreciate any wheezes, rales, or rhonchi Abdomen: + BS; soft, ND, NT Vascular: 2+ pedal pulses b/l; 2+ radial pulses b/l Extremities: no peripheral edema, no muscle wasting    Labs on Admission: I have personally reviewed following labs and imaging studies  CBC: Recent Labs  Lab 02/21/24 1654  WBC 19.5*  NEUTROABS 17.2*  HGB 14.1  HCT 44.6  MCV 99.1  PLT 278   Basic Metabolic Panel: Recent Labs  Lab 02/15/24 0953 02/21/24 1654  NA 139 138  K 4.3 4.0  CL 100 101  CO2 21 22  GLUCOSE 77 114*  BUN 16 16  CREATININE 1.11* 1.08*  CALCIUM  9.3 9.6   GFR: Estimated Creatinine Clearance: 40.1 mL/min (A) (by C-G formula based on SCr of 1.08 mg/dL (H)). Liver Function Tests: Recent Labs  Lab 02/21/24 1654  AST 29  ALT 16  ALKPHOS 58  BILITOT 1.1  PROT 7.0  ALBUMIN  4.3   No results for input(s): LIPASE, AMYLASE in the last 168 hours. No results for input(s): AMMONIA in the last 168 hours. Coagulation Profile: No results for input(s): INR, PROTIME in the last 168 hours. Cardiac Enzymes: No results for input(s): CKTOTAL, CKMB, CKMBINDEX, TROPONINI in the last 168 hours. BNP (last 3 results) No results for input(s): PROBNP in the last 8760  hours. HbA1C: No results for input(s): HGBA1C in the last 72 hours. CBG: No results for input(s): GLUCAP in the last 168 hours. Lipid Profile: No results for input(s): CHOL, HDL, LDLCALC, TRIG, CHOLHDL, LDLDIRECT in the last 72 hours. Thyroid  Function Tests: No results for input(s): TSH, T4TOTAL, FREET4, T3FREE, THYROIDAB in the last 72 hours. Anemia Panel: No results for input(s): VITAMINB12, FOLATE, FERRITIN, TIBC, IRON, RETICCTPCT in the last 72 hours. Urine analysis:    Component Value Date/Time   COLORURINE AMBER (A) 02/21/2024 2006   APPEARANCEUR HAZY (A) 02/21/2024 2006   APPEARANCEUR Clear 01/16/2024 0940   LABSPEC 1.019 02/21/2024 2006   PHURINE 5.0 02/21/2024 2006   GLUCOSEU NEGATIVE 02/21/2024 2006   HGBUR SMALL (A) 02/21/2024 2006   BILIRUBINUR NEGATIVE 02/21/2024 2006   BILIRUBINUR Negative 01/16/2024 0940   KETONESUR 5 (A) 02/21/2024 2006   PROTEINUR 30 (A) 02/21/2024 2006   UROBILINOGEN negative 09/30/2014 1049   UROBILINOGEN 0.2 07/07/2014 0537   NITRITE POSITIVE (A) 02/21/2024 2006   LEUKOCYTESUR LARGE (A) 02/21/2024 2006    Radiological Exams on Admission: CT Head Wo Contrast Result Date: 02/21/2024 CLINICAL DATA:  Altered mental status. EXAM: CT HEAD WITHOUT CONTRAST TECHNIQUE: Contiguous axial images were obtained from the base of the skull through the vertex without intravenous contrast. RADIATION DOSE REDUCTION: This exam was performed according to the departmental dose-optimization program which includes automated exposure control, adjustment of the mA and/or kV according to patient size and/or use of iterative reconstruction technique. COMPARISON:  None Available. FINDINGS: Brain: There is generalized cerebral atrophy with widening of the extra-axial spaces and ventricular dilatation. There are areas of decreased attenuation within the white matter tracts of the supratentorial brain, consistent with microvascular disease  changes. Vascular: No hyperdense vessel or unexpected calcification. Skull: Normal. Negative for fracture or focal lesion. Sinuses/Orbits: There is mild posterior left maxillary sinus mucosal thickening. Other: None. IMPRESSION: 1. Generalized cerebral atrophy and microvascular disease changes of the supratentorial brain. 2. No acute intracranial abnormality. 3. Mild left maxillary sinus disease. Electronically Signed   By: Suzen Dials M.D.   On: 02/21/2024 19:11   DG Chest 2 View Result Date: 02/21/2024 CLINICAL DATA:  Weakness EXAM: CHEST - 2 VIEW COMPARISON:  09/07/2023 FINDINGS: The heart size and mediastinal contours are within normal limits. Both lungs are clear. The visualized skeletal structures are unremarkable. IMPRESSION: No active cardiopulmonary disease. Electronically Signed   By: Franky Crease M.D.   On: 02/21/2024 17:34      Assessment/Plan    Principal Problem:   Acute metabolic encephalopathy Active Problems:   HLD (hyperlipidemia)   Chronic low back pain   Severe sepsis (HCC)   Acute cystitis   Leukocytosis   Generalized weakness   History of essential hypertension     #) Acute metabolic encephalopathy: 2 days of confusion relative to baseline mental status, including associated loss of orientation to time, as further detailed above, which appears to be on the basis of physiologic stressors stemming from presenting suspected severe sepsis due to acute cystitis.  No obvious additional contributory underlying infectious process at  this time, including chest x-ray which showed no evidence of infiltrate, while COVID, influenza, RSV PCR were all negative.  No additional overt metabolic/electrolyte contributions at this time.  Differential also outpatient pharmacologic factors, including recent prn Percocet as well as Zanaflex  in the setting of a history of chronic low back pain.  No overt acute focal neurologic deficits to suggest a contribution from an underlying acute  CVA, while presenting CT head shows no evidence of acute intracranial process, as above. Seizures are also felt to be less likely.  In the setting of outpatient use of high intensity atorvastatin , along with urinalysis that shows small hemoglobin in the absence of any RBCs, will check CPK level.  Plan: fall precautions. Delirium precautions. Repeat CMP/CBC in the AM. Check Mg level. check TSH, B12 level.  Check CPK level.  Hold home prn Percocet and Zanaflex  for now.  Further evaluation management of presenting severe sepsis due to acute cystitis, as below.                      #) Severe sepsis due to acute cystitis: In the setting of presenting with 2 days of altered mental status, confusion, lethargy with presenting urinalysis suggestive of UTI in the setting of hazy appearing specimen with significant pyuria, large leukocyte esterase, nitrate positive findings along with many bacteria in the absence of any squamous epithelial cells to suggest a contaminated specimen.  This is also in the context of a history of prior urinary tract infection, with family giving very similar presentation at the time of the latter dx.  SIRS criteria met via leukocytosis with neutrophilic predominance, along with tachycardia. Lactic acid level: Initially 1.9, with repeat value trending up to 3.0.  Repeat lactic acid level is currently pending.  Of note, given the associated presence of suspected end organ damage in the form of concominant presenting lactic acidosis, as well as presenting acute metabolic encephalopathy, criteria are met for pt's sepsis to be considered severe in nature.  Patient blood pressure was initially noted to be soft, but has improved with interval IV fluids, as above, with most recent SBP's in the 1-teens to 120's mmHg.  Of note, relative to the patient's documented body weight is 78.9 kg's, 30 mL/kg IVF bolus in the ED following administration of 2.5 L of LR.   In the ED, blood  cultures x 2 as well as urine culture were collected prior to initiation of Rocephin .  No e/o additional infectious process at this time, including chest x-ray which showed no evidence of infiltrate to suggest pneumonia, while COVID, influenza, RSV PCR were all negative.  Plan: CBC w/ diff and CMP in AM.  Follow for results of blood cx's x 2 as well as urine cx. Abx: Continue Rocephin , as above. Continuous LR. Will follow for result of repeat LA, as above.  As needed acetaminophen  for fever.  Monitor on telemetry.                       #) Generalized weakness: 2-day duration of generalized weakness, in the absence of any evidence of acute focal neurologic deficits, including no evidence of acute focal weakness to suggest acute CVA, while also noting that today CT head showed no evidence of acute intracranial process, including no evidence of acute infarct.  Suspect contribution from physiologic stress stemming from presenting severe sepsis due to urinary tract infection.    Will further eval for any additional contributions from endocrine/metabolic  sources, as detailed below.   Plan: work-up and management of presenting severe sepsis due to urinary tract infection, as described above. PT/OT consults ordered for the AM. Fall precautions. CMP/CBC in the AM. Check TSH, serum Mg level, B12 level. Check CPK level, as outlined above.                    #) Chronic low back pain: Documented history thereof, associated with documented history of sciatica.  It appears that the patient is on prn Percocet as well as Zanaflex  as an outpatient.  In the context of her presenting altered mental status as well as somnolence, we will hold him Percocet/Zanaflex  considering trended above.  Plan: Holding home Percocet and Zanaflex  for now, as above. Fall precautions.                      #) Essential Hypertension: documented h/o such, with outpatient  antihypertensive regimen including will, Lasix  40 mg p.o. daily.  SBP's in the ED today: Initially in the subsequent proving in the 1 teens to 120s following an IV fluids, as above mmHg. in the setting of soft initial blood pressures as well as suspected presenting severe sepsis due to urinary tract infection, as above, will hold home antihypertensive medications for now.  Plan: Close monitoring of subsequent BP via routine VS. hold home lisinopril  and Lasix  for now, as above.  Monitor strict I's and O's and daily weights.                     #) Hyperlipidemia: documented h/o such. On high intensity atorvastatin  as outpatient.   Plan: continue home statin.  Check CPK level, as above.         DVT prophylaxis: SCD's   Code Status: Full code Family Communication: none Disposition Plan: Per Rounding Team Consults called: none;  Admission status: Inpatient    I SPENT GREATER THAN 75  MINUTES IN CLINICAL CARE TIME/MEDICAL DECISION-MAKING IN COMPLETING THIS ADMISSION.     Eva NOVAK Adreanne Yono DO [facility] From 7PM - 7AM   02/21/2024, 9:48 PM

## 2024-02-22 ENCOUNTER — Inpatient Hospital Stay (HOSPITAL_COMMUNITY)

## 2024-02-22 DIAGNOSIS — G9341 Metabolic encephalopathy: Secondary | ICD-10-CM | POA: Diagnosis not present

## 2024-02-22 LAB — BLOOD CULTURE ID PANEL (REFLEXED) - BCID2

## 2024-02-22 LAB — LACTIC ACID, PLASMA
Lactic Acid, Venous: 1.7 mmol/L (ref 0.5–1.9)
Lactic Acid, Venous: 2.9 mmol/L (ref 0.5–1.9)

## 2024-02-22 LAB — COMPREHENSIVE METABOLIC PANEL WITH GFR
ALT: 14 U/L (ref 0–44)
AST: 33 U/L (ref 15–41)
Albumin: 3.4 g/dL — ABNORMAL LOW (ref 3.5–5.0)
Alkaline Phosphatase: 46 U/L (ref 38–126)
Anion gap: 11 (ref 5–15)
BUN: 13 mg/dL (ref 8–23)
CO2: 23 mmol/L (ref 22–32)
Calcium: 8.9 mg/dL (ref 8.9–10.3)
Chloride: 104 mmol/L (ref 98–111)
Creatinine, Ser: 0.93 mg/dL (ref 0.44–1.00)
GFR, Estimated: >60 mL/min (ref >60)
Glucose, Bld: 104 mg/dL — ABNORMAL HIGH (ref 70–99)
Potassium: 4 mmol/L (ref 3.5–5.1)
Sodium: 138 mmol/L (ref 135–145)
Total Bilirubin: 0.8 mg/dL (ref 0.0–1.2)
Total Protein: 5.7 g/dL — ABNORMAL LOW (ref 6.5–8.1)

## 2024-02-22 LAB — CBC WITH DIFFERENTIAL/PLATELET
Abs Immature Granulocytes: 0.12 K/uL — ABNORMAL HIGH (ref 0.00–0.07)
Basophils Absolute: 0 K/uL (ref 0.0–0.1)
Basophils Relative: 0 %
Eosinophils Absolute: 0 K/uL (ref 0.0–0.5)
Eosinophils Relative: 0 %
HCT: 41.8 % (ref 36.0–46.0)
Hemoglobin: 13.5 g/dL (ref 12.0–15.0)
Immature Granulocytes: 1 %
Lymphocytes Relative: 7 %
Lymphs Abs: 1 K/uL (ref 0.7–4.0)
MCH: 32.4 pg (ref 26.0–34.0)
MCHC: 32.3 g/dL (ref 30.0–36.0)
MCV: 100.2 fL — ABNORMAL HIGH (ref 80.0–100.0)
Monocytes Absolute: 0.6 K/uL (ref 0.1–1.0)
Monocytes Relative: 4 %
Neutro Abs: 12.5 K/uL — ABNORMAL HIGH (ref 1.7–7.7)
Neutrophils Relative %: 88 %
Platelets: 205 K/uL (ref 150–400)
RBC: 4.17 MIL/uL (ref 3.87–5.11)
RDW: 12.7 % (ref 11.5–15.5)
WBC: 14.1 K/uL — ABNORMAL HIGH (ref 4.0–10.5)
nRBC: 0 % (ref 0.0–0.2)

## 2024-02-22 LAB — T4, FREE: Free T4: 1.05 ng/dL (ref 0.61–1.12)

## 2024-02-22 LAB — TSH: TSH: 0.208 u[IU]/mL — ABNORMAL LOW (ref 0.350–4.500)

## 2024-02-22 LAB — MAGNESIUM
Magnesium: 1.6 mg/dL — ABNORMAL LOW (ref 1.7–2.4)
Magnesium: 1.9 mg/dL (ref 1.7–2.4)

## 2024-02-22 LAB — C DIFFICILE QUICK SCREEN W PCR REFLEX
C Diff antigen: NEGATIVE
C Diff interpretation: NOT DETECTED
C Diff toxin: NEGATIVE

## 2024-02-22 LAB — CK: Total CK: 123 U/L (ref 38–234)

## 2024-02-22 LAB — PROTIME-INR
INR: 1.1 (ref 0.8–1.2)
Prothrombin Time: 14.4 s (ref 11.4–15.2)

## 2024-02-22 LAB — VITAMIN B12: Vitamin B-12: 174 pg/mL — ABNORMAL LOW (ref 180–914)

## 2024-02-22 MED ORDER — SODIUM CHLORIDE (PF) 0.9 % IJ SOLN
INTRAMUSCULAR | Status: AC
  Filled 2024-02-22: qty 50

## 2024-02-22 MED ORDER — LACTATED RINGERS IV SOLN: INTRAVENOUS | Status: DC

## 2024-02-22 MED ORDER — METRONIDAZOLE 500 MG/100ML IV SOLN
500.0000 mg | Freq: Two times a day (BID) | INTRAVENOUS | Status: DC
  Administered 2024-02-22 – 2024-02-23 (×3): 500 mg via INTRAVENOUS
  Filled 2024-02-22 (×3): qty 100

## 2024-02-22 MED ORDER — ENOXAPARIN SODIUM 40 MG/0.4ML IJ SOSY
40.0000 mg | PREFILLED_SYRINGE | INTRAMUSCULAR | Status: DC
  Administered 2024-02-22 – 2024-02-26 (×5): 40 mg via SUBCUTANEOUS
  Filled 2024-02-22 (×5): qty 0.4

## 2024-02-22 MED ORDER — CYANOCOBALAMIN 1000 MCG/ML IJ SOLN
1000.0000 ug | Freq: Every day | INTRAMUSCULAR | Status: AC
Start: 2024-02-22 — End: 2024-02-26
  Administered 2024-02-22 – 2024-02-26 (×5): 1000 ug via INTRAMUSCULAR
  Filled 2024-02-22 (×5): qty 1

## 2024-02-22 MED ORDER — OXYCODONE-ACETAMINOPHEN 5-325 MG PO TABS
1.0000 | ORAL_TABLET | Freq: Three times a day (TID) | ORAL | Status: DC | PRN
  Administered 2024-02-22 – 2024-02-26 (×9): 1 via ORAL
  Filled 2024-02-22 (×10): qty 1

## 2024-02-22 MED ORDER — IOHEXOL 300 MG/ML  SOLN
100.0000 mL | Freq: Once | INTRAMUSCULAR | Status: AC | PRN
  Administered 2024-02-22: 100 mL via INTRAVENOUS

## 2024-02-22 MED ORDER — IOHEXOL 9 MG/ML PO SOLN
500.0000 mL | ORAL | Status: AC
Start: 2024-02-22 — End: 2024-02-22
  Administered 2024-02-22 (×2): 500 mL via ORAL

## 2024-02-22 NOTE — Progress Notes (Signed)
 PHARMACY - PHYSICIAN COMMUNICATION CRITICAL VALUE ALERT - BLOOD CULTURE IDENTIFICATION (BCID)  [name] is an 78 y.o. female who presented to [facility] on 02/21/2024 with a chief complaint of sepsis due to acute cystitis  Assessment: 1/4 Staph epi, mecA+  Name of physician (or Provider) Contacted: Andrez  Current antibiotics: CTX  Changes to prescribed antibiotics recommended:  None, probable contaminant  Results for orders placed or performed during the hospital encounter of 02/21/24  Blood Culture ID Panel (Reflexed) (Collected: 02/21/2024  8:20 PM)  Result Value Ref Range   Enterococcus faecalis NOT DETECTED NOT DETECTED   Enterococcus Faecium NOT DETECTED NOT DETECTED   Listeria monocytogenes NOT DETECTED NOT DETECTED   Staphylococcus species DETECTED (A) NOT DETECTED   Staphylococcus aureus (BCID) NOT DETECTED NOT DETECTED   Staphylococcus epidermidis DETECTED (A) NOT DETECTED   Staphylococcus lugdunensis NOT DETECTED NOT DETECTED   Streptococcus species NOT DETECTED NOT DETECTED   Streptococcus agalactiae NOT DETECTED NOT DETECTED   Streptococcus pneumoniae NOT DETECTED NOT DETECTED   Streptococcus pyogenes NOT DETECTED NOT DETECTED   A.calcoaceticus-baumannii NOT DETECTED NOT DETECTED   Bacteroides fragilis NOT DETECTED NOT DETECTED   Enterobacterales NOT DETECTED NOT DETECTED   Enterobacter cloacae complex NOT DETECTED NOT DETECTED   Escherichia coli NOT DETECTED NOT DETECTED   Klebsiella aerogenes NOT DETECTED NOT DETECTED   Klebsiella oxytoca NOT DETECTED NOT DETECTED   Klebsiella pneumoniae NOT DETECTED NOT DETECTED   Proteus species NOT DETECTED NOT DETECTED   Salmonella species NOT DETECTED NOT DETECTED   Serratia marcescens NOT DETECTED NOT DETECTED   Haemophilus influenzae NOT DETECTED NOT DETECTED   Neisseria meningitidis NOT DETECTED NOT DETECTED   Pseudomonas aeruginosa NOT DETECTED NOT DETECTED   Stenotrophomonas maltophilia NOT DETECTED NOT  DETECTED   Candida albicans NOT DETECTED NOT DETECTED   Candida auris NOT DETECTED NOT DETECTED   Candida glabrata NOT DETECTED NOT DETECTED   Candida krusei NOT DETECTED NOT DETECTED   Candida parapsilosis NOT DETECTED NOT DETECTED   Candida tropicalis NOT DETECTED NOT DETECTED   Cryptococcus neoformans/gattii NOT DETECTED NOT DETECTED   Methicillin resistance mecA/C DETECTED (A) NOT DETECTED    Leeroy Mace RPh 02/22/2024, 10:49 PM

## 2024-02-22 NOTE — TOC Initial Note (Signed)
 Transition of Care [facility]) - Initial/Assessment Note    Patient Details  Name: [name] MRN: [id number] Date of Birth: August 11, 1945  [facility] CM/SW Contact:    Bascom Service, RN Phone Number: 02/22/2024, 11:15 AM  Clinical Narrative: spoke to Amanda(grand dtr) d/c plan home. Has own transport. Await PT eval & recc.                  Expected Discharge Plan: Home w Home Health Services Barriers to Discharge: Continued Medical Work up   Patient Goals and CMS Choice Patient states their goals for this hospitalization and ongoing recovery are:: Home CMS Medicare.gov Compare Post Acute Care list provided to:: Patient Represenative (must comment) (Amanda(grand dtr)) Choice offered to / list presented to : Adult Children [place] ownership interest in [facility].provided to:: Adult Children    Expected Discharge Plan and Services   Discharge Planning Services: CM Consult Post Acute Care Choice: Home Health Living arrangements for the past 2 months: Single Family Home                                      Prior Living Arrangements/Services Living arrangements for the past 2 months: Single Family Home Lives with:: Adult Children              Current home services: DME (cane,rw,3n1)    Activities of Daily Living   ADL Screening (condition at time of admission) Independently performs ADLs?: Yes (appropriate for developmental age) Is the patient deaf or have difficulty hearing?: No Does the patient have difficulty seeing, even when wearing glasses/contacts?: No Does the patient have difficulty concentrating, remembering, or making decisions?: Yes  Permission Sought/Granted                  Emotional Assessment              Admission diagnosis:  Acute metabolic encephalopathy [G93.41] Sepsis, due to unspecified organism, unspecified whether acute organ dysfunction present (HCC) [A41.9] Acute encephalopathy due to  infection [G93.49, B99.9] Patient Active Problem List   Diagnosis Date Noted   Acute metabolic encephalopathy 02/21/2024   Acute cystitis 02/21/2024   Leukocytosis 02/21/2024   Generalized weakness 02/21/2024   History of essential hypertension 02/21/2024   Gastroesophageal reflux disease without esophagitis 11/20/2023   Lower urinary tract symptoms (LUTS) 07/10/2023   Bacteriuria 07/10/2023   Severe sepsis (HCC) 07/22/2021   Colon polyp 08/23/2019   Adenomatous polyp of ascending colon 08/23/2019   Carpal tunnel syndrome of left wrist 03/16/2018   Carpal tunnel syndrome of right wrist 03/16/2018   Chronic low back pain    Peripheral neuropathy 02/08/2016   Depression 02/08/2016   OA (osteoarthritis) of hip 07/04/2014   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 [address] [place] - [address] [place] [address] Phone: [phone] Fax: [phone]  [facility] [place], [place] - [address] [place] [address] Phone: [phone] Fax: [phone]  Social Drivers of Health (SDOH) Social History: SDOH Screenings   Food Insecurity: No Food Insecurity (02/21/2024)  Housing: Low Risk  (02/21/2024)  Transportation Needs: No Transportation Needs (02/21/2024)  Utilities: Not At Risk (02/21/2024)  Alcohol Screen: Low Risk  (06/28/2023)  Depression (PHQ2-9): Low Risk  (01/16/2024)  Financial Resource Strain: Low Risk  (06/28/2023)  Physical Activity: Insufficiently Active (06/28/2023)  Social Connections: Moderately Isolated (02/21/2024)  Stress:  No Stress Concern Present (06/28/2023)  Tobacco Use: Low Risk  (02/21/2024)  Health Literacy: Adequate Health Literacy (06/28/2023)   SDOH Interventions:     Readmission Risk Interventions     No data to display

## 2024-02-22 NOTE — Progress Notes (Signed)
 OT Cancellation Note  Patient Details Name: [name] MRN: [id number] DOB: 21-May-1945   Cancelled Treatment:    Reason Eval/Treat Not Completed: Fatigue/lethargy limiting ability to participate  Patient asleep upon OT attempted evaluation. Will continue to follow acutely as patient able to participate.  Jalaysia Lobb OT/L Acute Rehabilitation Department  [phone]    02/22/2024, 12:00 PM

## 2024-02-22 NOTE — Evaluation (Signed)
 Physical Therapy Evaluation Patient Details Name: [name] MRN: [id number] DOB: 1945-07-12 Today's Date: 02/22/2024  History of Present Illness  [name] is a 78 y.o. female  who is admitted to [facility] on 02/21/2024 with acute metabolic encephalopathy due to severe sepsis secondary to acute cystitis after presenting from home to [facility] ED for evaluation of altered mental status. PMH:  chronic low back pain, essential hypertension, hyperlipidemia  Clinical Impression  Pt admitted with above diagnosis. Pt in bed, agreeable to session. Per pt report she was mod I with all activity at home. She is able to complete bed mobility and Sit to Stand at [facility] I with use of walker into standing. Upon standing, pt fecal bag leaked and pt prompted to return to sitting and nursing present to assist with donning new bag. Pt able to complete small amb distance in the room with assist for line management. She demonstrates tolerance to standing balance and weight shifting. Pt left in the care of nursing. Pt currently with functional limitations due to the deficits listed below (see PT Problem List). Pt will benefit from acute skilled PT to increase their independence and safety with mobility to allow discharge.           If plan is discharge home, recommend the following: A little help with walking and/or transfers;A little help with bathing/dressing/bathroom;Assistance with cooking/housework;Help with stairs or ramp for entrance;Assist for transportation   Can travel by private vehicle        Equipment Recommendations None recommended by PT  Recommendations for Other Services       Functional Status Assessment Patient has had a recent decline in their functional status and demonstrates the ability to make significant improvements in function in a reasonable and predictable amount of time.     Precautions / Restrictions Precautions Precautions: Fall Recall of  Precautions/Restrictions: Intact Restrictions Weight Bearing Restrictions Per Provider Order: No      Mobility  Bed Mobility Overal bed mobility: Modified Independent             General bed mobility comments: inc time and use of bed rails, but no physical assist or cues required    Transfers Overall transfer level: Modified independent Equipment used: Rolling walker (2 wheels)               General transfer comment: STS and bed to chair completed at mod I, pt requires sup A only for line management    Ambulation/Gait Ambulation/Gait assistance: Supervision Gait Distance (Feet): 10 Feet Assistive device: Rolling walker (2 wheels) Gait Pattern/deviations: Step-through pattern, Decreased stride length Gait velocity: dec     General Gait Details: Pt able to amb with step through pattern and completes directional changes with good weight shift and appropriate use of WB BUE through walker  Stairs Stairs:  (Pt demonstrates the functional strength and stability to negotiate steps with HR at home per setup. Pt report conflicts with prior home setup. Pt states that she has steps and chart review notes ramped entrance.)          Wheelchair Mobility     Tilt Bed    Modified Rankin (Stroke Patients Only)       Balance Overall balance assessment: Needs assistance Sitting-balance support: Feet supported, No upper extremity supported Sitting balance-Leahy Scale: Normal     Standing balance support: During functional activity, No upper extremity supported Standing balance-Leahy Scale: Good  Pertinent Vitals/Pain Pain Assessment Pain Assessment: No/denies pain    Home Living Family/patient expects to be discharged to:: Private residence Living Arrangements: Children Available Help at Discharge: Available 24 hours/day Type of Home: House Home Access: Stairs to enter Entrance Stairs-Rails: Right Entrance  Stairs-Number of Steps: 3   Home Layout: One level Home Equipment: Agricultural consultant (2 wheels);Cane - single point;Tub bench Additional Comments: reacher    Prior Function                       Extremity/Trunk Assessment   Upper Extremity Assessment Upper Extremity Assessment: Defer to OT evaluation    Lower Extremity Assessment Lower Extremity Assessment: Generalized weakness    Cervical / Trunk Assessment Cervical / Trunk Assessment: Normal  Communication   Communication Communication: No apparent difficulties    Cognition Arousal: Alert Behavior During Therapy: WFL for tasks assessed/performed   PT - Cognitive impairments: No family/caregiver present to determine baseline                         Following commands: Intact       Cueing Cueing Techniques: Verbal cues, Gestural cues     General Comments General comments (skin integrity, edema, etc.): fecal collection bag leaked during session. Pt able to be cleaned, nursing present to apply new bag.    Exercises     Assessment/Plan    PT Assessment Patient needs continued PT services  PT Problem List Decreased strength;Decreased balance;Decreased activity tolerance;Decreased mobility;Decreased cognition       PT Treatment Interventions DME instruction;Functional mobility training;Balance training;Patient/family education;Gait training;Therapeutic activities;Stair training;Therapeutic exercise    PT Goals (Current goals can be found in the Care Plan section)  Acute Rehab PT Goals Patient Stated Goal: Get better and return home PT Goal Formulation: With patient Time For Goal Achievement: 03/07/24 Potential to Achieve Goals: Good    Frequency Min 2X/week     Co-evaluation               AM-PAC PT 6 Clicks Mobility  Outcome Measure Help needed turning from your back to your side while in a flat bed without using bedrails?: None Help needed moving from lying on your back to  sitting on the side of a flat bed without using bedrails?: None Help needed moving to and from a bed to a chair (including a wheelchair)?: A Little Help needed standing up from a chair using your arms (e.g., wheelchair or bedside chair)?: None Help needed to walk in hospital room?: A Little Help needed climbing 3-5 steps with a railing? : A Little 6 Click Score: 21    End of Session Equipment Utilized During Treatment: Gait belt Activity Tolerance: Patient tolerated treatment well Patient left: in chair;with nursing/sitter in room Nurse Communication: Mobility status PT Visit Diagnosis: Difficulty in walking, not elsewhere classified (R26.2);Muscle weakness (generalized) (M62.81)    Time: 8453-8384 PT Time Calculation (min) (ACUTE ONLY): 29 min   Charges:   PT Evaluation $PT Eval Low Complexity: 1 Low PT Treatments $Therapeutic Activity: 8-22 mins PT General Charges $$ ACUTE PT VISIT: 1 Visit         Stann, PT Acute Rehabilitation Services Office: [phone] 02/22/2024   Stann DELENA Ohara 02/22/2024, 4:33 PM

## 2024-02-22 NOTE — Progress Notes (Signed)
 PROGRESS NOTE    [name]  FMW:[id number] DOB: 1946-03-12 DOA: 02/21/2024 PCP: Gladis Mustard, FNP   Brief Narrative: 78 year old with past medical history significant for chronic low back pain, hypertension, hyperlipidemia presented on 12/22/2023 with acute metabolic encephalopathy thought to be related to severe sepsis secondary to acute cystitis.  Patient presented with altered mental status, patient has been confused over the last 2 days, increased lethargy, somnolence and generalized weakness.  Evaluation in the ED initially systolic blood pressure was in the 90s oxygen saturation 91 room air, tachypnea respiration rate 27, sodium 130 white blood cell 19, UA 21-50 red blood cells and large leukocytes.  CT head no evidence of acute intracranial process.  Chest x-ray negative.   Assessment & Plan:   Principal Problem:   Acute metabolic encephalopathy Active Problems:   HLD (hyperlipidemia)   Chronic low back pain   Severe sepsis (HCC)   Acute cystitis   Leukocytosis   Generalized weakness   History of essential hypertension   1-Acute metabolic encephalopathy in the setting of sepsis, UTI also component of B12 deficiency Component of polypharmacy: Percocet and Zanaflex  CT head no acute findings. She seems more alert. Report back pain, chronic. Asking for her percocet. Discussed with nurse, will resume lower dose percocet to be given if patient is alert.   2-Severe sepsis due to Acute cystitis: - Patient presents with leukocytosis, lactic acidosis, source of infection UTI, tachypnea, tachycardia.  UA with 21-50 white blood cell. - White blood cell trending down 19---14 - Follow urine culture and blood cultures -Added Flagyl , patient having diarrhea.   3-Diarrhea;  C diff and GI pathogen order.  Added Flagyl .  Report abdominal pain, will check CT abdomen and pelvis.    4-Generalized weakness -In setting acute illness.  -Supplement B 12.  -TSH low, check  free T 3 and free T4.   5-Lactic acidosis -In setting of sepsis.  -Continue with IV fluids, IV antibiotics.   6-Low TSH: -Check Free T3 and T 4   7-B12 deficiency: -Start IM B12 injection, will need oral supplementation after.   Chronic low back pain -will resume lower dose percocet  Essential hypertension: -Hold BP medications in setting of sepsis. Lisinopril  and lasix .    Hyperlipidemia: -Continue Lipitor.    Estimated body mass index is 33.15 kg/m as calculated from the following:   Height as of this encounter: 5' (1.524 m).   Weight as of this encounter: 77 kg.   DVT prophylaxis: Lovenox  Code Status: Full code Family Communication: Disposition Plan:  Status is: Inpatient Remains inpatient appropriate because: management of sepsis     Consultants:  none  Procedures:  none  Antimicrobials:    Subjective: She is alert, started having diarrhea overnight. Report left LQ pain.  Asking for her pain medications.   Objective: Vitals:   02/21/24 2254 02/21/24 2319 02/22/24 0324 02/22/24 0453  BP: 127/78  (!) 121/51   Pulse: 90  81   Resp: 16  15   Temp: 99 F (37.2 C)  98.9 F (37.2 C)   TempSrc: Oral  Oral   SpO2: 95%  96%   Weight:  77 kg  77 kg  Height:  5' (1.524 m)      Intake/Output Summary (Last 24 hours) at 02/22/2024 0743 Last data filed at 02/22/2024 0540 Gross per 24 hour  Intake 4272.28 ml  Output 170 ml  Net 4102.28 ml   Filed Weights   02/21/24 2319 02/22/24 0453  Weight: 77 kg  77 kg    Examination:  General exam: Appears calm and comfortable  Respiratory system: Clear to auscultation. Respiratory effort normal. Cardiovascular system: S1 & S2 heard, RRR.  Gastrointestinal system: Abdomen is nondistended, soft and nontender. No organomegaly or masses felt. Normal bowel sounds heard. Central nervous system: Alert and oriented. Follows command Extremities: Symmetric 5 x 5 power.    Data Reviewed: I have personally reviewed  following labs and imaging studies  CBC: Recent Labs  Lab 02/21/24 1654 02/22/24 0512  WBC 19.5* 14.1*  NEUTROABS 17.2* 12.5*  HGB 14.1 13.5  HCT 44.6 41.8  MCV 99.1 100.2*  PLT 278 205   Basic Metabolic Panel: Recent Labs  Lab 02/15/24 0953 02/21/24 1654 02/21/24 2342 02/22/24 0512  NA 139 138  --  138  K 4.3 4.0  --  4.0  CL 100 101  --  104  CO2 21 22  --  23  GLUCOSE 77 114*  --  104*  BUN 16 16  --  13  CREATININE 1.11* 1.08*  --  0.93  CALCIUM  9.3 9.6  --  8.9  MG  --   --  1.6* 1.9   GFR: Estimated Creatinine Clearance: 45 mL/min (by C-G formula based on SCr of 0.93 mg/dL). Liver Function Tests: Recent Labs  Lab 02/21/24 1654 02/22/24 0512  AST 29 33  ALT 16 14  ALKPHOS 58 46  BILITOT 1.1 0.8  PROT 7.0 5.7*  ALBUMIN  4.3 3.4*   No results for input(s): LIPASE, AMYLASE in the last 168 hours. No results for input(s): AMMONIA in the last 168 hours. Coagulation Profile: Recent Labs  Lab 02/22/24 0512  INR 1.1   Cardiac Enzymes: Recent Labs  Lab 02/21/24 2342  CKTOTAL 123   BNP (last 3 results) No results for input(s): PROBNP in the last 8760 hours. HbA1C: No results for input(s): HGBA1C in the last 72 hours. CBG: No results for input(s): GLUCAP in the last 168 hours. Lipid Profile: No results for input(s): CHOL, HDL, LDLCALC, TRIG, CHOLHDL, LDLDIRECT in the last 72 hours. Thyroid  Function Tests: Recent Labs    02/21/24 2342  TSH 0.208*   Anemia Panel: Recent Labs    02/21/24 2342  VITAMINB12 174*   Sepsis Labs: Recent Labs  Lab 02/21/24 1700 02/21/24 1957 02/21/24 2342 02/22/24 0512  LATICACIDVEN 1.9 3.0* 2.9* 1.7    Recent Results (from the past 240 hours)  Resp panel by RT-PCR (RSV, Flu A&B, Covid) Anterior Nasal Swab     Status: None   Collection Time: 02/21/24  7:54 PM   Specimen: Anterior Nasal Swab  Result Value Ref Range Status   SARS Coronavirus 2 by RT PCR NEGATIVE NEGATIVE Final     Comment: (NOTE) SARS-CoV-2 target nucleic acids are NOT DETECTED.  The SARS-CoV-2 RNA is generally detectable in upper respiratory specimens during the acute phase of infection. The lowest concentration of SARS-CoV-2 viral copies this assay can detect is 138 copies/mL. A negative result does not preclude SARS-Cov-2 infection and should not be used as the sole basis for treatment or other patient management decisions. A negative result may occur with  improper specimen collection/handling, submission of specimen other than nasopharyngeal swab, presence of viral mutation(s) within the areas targeted by this assay, and inadequate number of viral copies(<138 copies/mL). A negative result must be combined with clinical observations, patient history, and epidemiological information. The expected result is Negative.  Fact Sheet for Patients:  BloggerCourse.com  Fact Sheet for Healthcare Providers:  SeriousBroker.it  This test is no t yet approved or cleared by the [place]  FDA and  has been authorized for detection and/or diagnosis of SARS-CoV-2 by FDA under an Emergency Use Authorization (EUA). This EUA will remain  in effect (meaning this test can be used) for the duration of the COVID-19 declaration under Section 564(b)(1) of the Act, 21 U.S.C.section 360bbb-3(b)(1), unless the authorization is terminated  or revoked sooner.       Influenza A by PCR NEGATIVE NEGATIVE Final   Influenza B by PCR NEGATIVE NEGATIVE Final    Comment: (NOTE) The Xpert Xpress SARS-CoV-2/FLU/RSV plus assay is intended as an aid in the diagnosis of influenza from Nasopharyngeal swab specimens and should not be used as a sole basis for treatment. Nasal washings and aspirates are unacceptable for Xpert Xpress SARS-CoV-2/FLU/RSV testing.  Fact Sheet for Patients: BloggerCourse.com  Fact Sheet for Healthcare  Providers: SeriousBroker.it  This test is not yet approved or cleared by the [place]  FDA and has been authorized for detection and/or diagnosis of SARS-CoV-2 by FDA under an Emergency Use Authorization (EUA). This EUA will remain in effect (meaning this test can be used) for the duration of the COVID-19 declaration under Section 564(b)(1) of the Act, 21 U.S.C. section 360bbb-3(b)(1), unless the authorization is terminated or revoked.     Resp Syncytial Virus by PCR NEGATIVE NEGATIVE Final    Comment: (NOTE) Fact Sheet for Patients: BloggerCourse.com  Fact Sheet for Healthcare Providers: SeriousBroker.it  This test is not yet approved or cleared by the [place]  FDA and has been authorized for detection and/or diagnosis of SARS-CoV-2 by FDA under an Emergency Use Authorization (EUA). This EUA will remain in effect (meaning this test can be used) for the duration of the COVID-19 declaration under Section 564(b)(1) of the Act, 21 U.S.C. section 360bbb-3(b)(1), unless the authorization is terminated or revoked.  Performed at [facility], [address], [place], [place] [address]   Blood culture (routine x 2)     Status: None (Preliminary result)   Collection Time: 02/21/24  8:20 PM   Specimen: BLOOD  Result Value Ref Range Status   Specimen Description   Final    BLOOD BLOOD RIGHT HAND Performed at [facility], [address], [place], [place] [address]    Special Requests   Final    Blood Culture results may not be optimal due to an inadequate volume of blood received in culture bottles BOTTLES DRAWN AEROBIC AND ANAEROBIC Performed at [facility], [address] [place], [place] [address]    Culture   Final    NO GROWTH < 12 HOURS Performed at [facility], [address] [place], [place] [address]    Report Status PENDING   Incomplete  Blood culture (routine x 2)     Status: None (Preliminary result)   Collection Time: 02/21/24  8:25 PM   Specimen: BLOOD  Result Value Ref Range Status   Specimen Description   Final    BLOOD RIGHT ANTECUBITAL Performed at [facility], [address], [place], [place] [address]    Special Requests   Final    Blood Culture results may not be optimal due to an inadequate volume of blood received in culture bottles BOTTLES DRAWN AEROBIC AND ANAEROBIC Performed at [facility], [address] [place], [place] [address]    Culture   Final    NO GROWTH < 12 HOURS Performed at [facility], [address], [place], [place] [address]    Report Status PENDING  Incomplete         Radiology Studies: CT Head Wo Contrast Result Date: 02/21/2024 CLINICAL DATA:  Altered mental status. EXAM: CT HEAD WITHOUT CONTRAST TECHNIQUE: Contiguous axial images were obtained from the base of the skull through the vertex without intravenous contrast. RADIATION DOSE REDUCTION: This exam was performed according to the departmental dose-optimization program which includes automated exposure control, adjustment of the mA and/or kV according to patient size and/or use of iterative reconstruction technique. COMPARISON:  None Available. FINDINGS: Brain: There is generalized cerebral atrophy with widening of the extra-axial spaces and ventricular dilatation. There are areas of decreased attenuation within the white matter tracts of the supratentorial brain, consistent with microvascular disease changes. Vascular: No hyperdense vessel or unexpected calcification. Skull: Normal. Negative for fracture or focal lesion. Sinuses/Orbits: There is mild posterior left maxillary sinus mucosal thickening. Other: None. IMPRESSION: 1. Generalized cerebral atrophy and microvascular disease changes of the supratentorial brain. 2. No acute intracranial abnormality. 3. Mild left  maxillary sinus disease. Electronically Signed   By: Suzen Dials M.D.   On: 02/21/2024 19:11   DG Chest 2 View Result Date: 02/21/2024 CLINICAL DATA:  Weakness EXAM: CHEST - 2 VIEW COMPARISON:  09/07/2023 FINDINGS: The heart size and mediastinal contours are within normal limits. Both lungs are clear. The visualized skeletal structures are unremarkable. IMPRESSION: No active cardiopulmonary disease. Electronically Signed   By: Franky Crease M.D.   On: 02/21/2024 17:34        Scheduled Meds:  atorvastatin   40 mg Oral Daily   Continuous Infusions:  cefTRIAXone  (ROCEPHIN )  IV     lactated ringers  150 mL/hr at 02/21/24 2310     LOS: 1 day    Time spent: 35 minutes    Keegan Ducey A Adryel Wortmann, MD [facility]   If 7PM-7AM, please contact night-coverage [email or web address]  02/22/2024, 7:43 AM

## 2024-02-23 ENCOUNTER — Ambulatory Visit: Admitting: Nurse Practitioner

## 2024-02-23 DIAGNOSIS — B952 Enterococcus as the cause of diseases classified elsewhere: Secondary | ICD-10-CM

## 2024-02-23 DIAGNOSIS — A020 Salmonella enteritis: Secondary | ICD-10-CM

## 2024-02-23 DIAGNOSIS — D72829 Elevated white blood cell count, unspecified: Secondary | ICD-10-CM | POA: Diagnosis not present

## 2024-02-23 DIAGNOSIS — R7881 Bacteremia: Secondary | ICD-10-CM

## 2024-02-23 DIAGNOSIS — G9341 Metabolic encephalopathy: Secondary | ICD-10-CM | POA: Diagnosis not present

## 2024-02-23 LAB — GASTROINTESTINAL PANEL BY PCR, STOOL (REPLACES STOOL CULTURE)

## 2024-02-23 LAB — T3, FREE: T3, Free: 1.6 pg/mL — ABNORMAL LOW (ref 2.0–4.4)

## 2024-02-23 LAB — GLUCOSE, CAPILLARY: Glucose-Capillary: 101 mg/dL — ABNORMAL HIGH (ref 70–99)

## 2024-02-23 LAB — CBC
HCT: 34.8 % — ABNORMAL LOW (ref 36.0–46.0)
Hemoglobin: 11.4 g/dL — ABNORMAL LOW (ref 12.0–15.0)
MCH: 32.2 pg (ref 26.0–34.0)
MCHC: 32.8 g/dL (ref 30.0–36.0)
MCV: 98.3 fL (ref 80.0–100.0)
Platelets: 170 K/uL (ref 150–400)
RBC: 3.54 MIL/uL — ABNORMAL LOW (ref 3.87–5.11)
RDW: 12.2 % (ref 11.5–15.5)
WBC: 6.6 K/uL (ref 4.0–10.5)
nRBC: 0 % (ref 0.0–0.2)

## 2024-02-23 LAB — BASIC METABOLIC PANEL WITH GFR
Anion gap: 9 (ref 5–15)
BUN: 10 mg/dL (ref 8–23)
CO2: 22 mmol/L (ref 22–32)
Calcium: 8.4 mg/dL — ABNORMAL LOW (ref 8.9–10.3)
Chloride: 107 mmol/L (ref 98–111)
Creatinine, Ser: 0.84 mg/dL (ref 0.44–1.00)
GFR, Estimated: >60 mL/min (ref >60)
Glucose, Bld: 110 mg/dL — ABNORMAL HIGH (ref 70–99)
Potassium: 3.2 mmol/L — ABNORMAL LOW (ref 3.5–5.1)
Sodium: 138 mmol/L (ref 135–145)

## 2024-02-23 LAB — CK: Total CK: 182 U/L (ref 38–234)

## 2024-02-23 LAB — MAGNESIUM: Magnesium: 1.9 mg/dL (ref 1.7–2.4)

## 2024-02-23 MED ORDER — SODIUM CHLORIDE 0.9 % IV SOLN
2.0000 g | INTRAVENOUS | Status: AC
  Administered 2024-02-23: 2 g via INTRAVENOUS
  Filled 2024-02-23: qty 20

## 2024-02-23 MED ORDER — SODIUM CHLORIDE 0.9 % IV SOLN
2.0000 g | Freq: Three times a day (TID) | INTRAVENOUS | Status: DC
  Filled 2024-02-23 (×2): qty 40

## 2024-02-23 MED ORDER — POTASSIUM CHLORIDE CRYS ER 20 MEQ PO TBCR
40.0000 meq | EXTENDED_RELEASE_TABLET | Freq: Once | ORAL | Status: AC
  Administered 2024-02-23: 40 meq via ORAL
  Filled 2024-02-23: qty 2

## 2024-02-23 MED ORDER — DAPTOMYCIN-SODIUM CHLORIDE 500-0.9 MG/50ML-% IV SOLN
8.0000 mg/kg | Freq: Every day | INTRAVENOUS | Status: DC
  Administered 2024-02-23 – 2024-02-25 (×3): 500 mg via INTRAVENOUS
  Filled 2024-02-23 (×4): qty 50

## 2024-02-23 MED ORDER — SODIUM CHLORIDE 0.9 % IV SOLN
2.0000 g | INTRAVENOUS | Status: DC
  Administered 2024-02-24 – 2024-02-25 (×2): 2 g via INTRAVENOUS
  Filled 2024-02-23 (×2): qty 20

## 2024-02-23 MED ORDER — LACTATED RINGERS IV SOLN: INTRAVENOUS | Status: AC

## 2024-02-23 MED ORDER — MEROPENEM 1 G IV SOLR: 2.0000 g | Freq: Three times a day (TID) | INTRAVENOUS | Status: DC

## 2024-02-23 NOTE — TOC Progression Note (Addendum)
 Transition of Care [facility]) - Progression Note    Patient Details  Name: [name] MRN: [id number] Date of Birth: 05/24/1945  Transition of [facility]) CM/SW Contact  Cythia Bachtel, Nathanel, RN Phone Number: 02/23/2024, 12:32 PM  Clinical Narrative:PT recc HHPT-sent via hub await choices. Left vm w/dtr amanda for call back.      Expected Discharge Plan: Home w Home Health Services Barriers to Discharge: Continued Medical Work up               Expected Discharge Plan and Services   Discharge Planning Services: CM Consult Post Acute Care Choice: Home Health Living arrangements for the past 2 months: Single Family Home                                       Social Drivers of Health (SDOH) Interventions SDOH Screenings   Food Insecurity: No Food Insecurity (02/21/2024)  Housing: Low Risk  (02/21/2024)  Transportation Needs: No Transportation Needs (02/21/2024)  Utilities: Not At Risk (02/21/2024)  Alcohol Screen: Low Risk  (06/28/2023)  Depression (PHQ2-9): Low Risk  (01/16/2024)  Financial Resource Strain: Low Risk  (06/28/2023)  Physical Activity: Insufficiently Active (06/28/2023)  Social Connections: Moderately Isolated (02/21/2024)  Stress: No Stress Concern Present (06/28/2023)  Tobacco Use: Low Risk  (02/21/2024)  Health Literacy: Adequate Health Literacy (06/28/2023)    Readmission Risk Interventions     No data to display

## 2024-02-23 NOTE — Evaluation (Signed)
 Occupational Therapy Evaluation Patient Details Name: [name] MRN: [id number] DOB: 1945-11-11 Today's Date: 02/23/2024   History of Present Illness   Patient is a 78 y.o. female who is admitted to [facility] on 02/21/2024 with acute metabolic encephalopathy due to severe sepsis secondary to acute cystitis after presenting from home to [facility] ED for evaluation of altered mental status.  Patient also has Dx of Salmonella infection in GI tract.  PMH:  chronic low back pain, multiple spinal surgeries, essential hypertension, hyperlipidemia     Clinical Impressions PTA, patient was grossly independent completing ADL and IADL activities while living at home alone but with multiple family members nearby in the family compound.  Acute metabolic encephalopathy has impacted patient's cognition to impact baseline independence as evidenced by a 4/10 score on the Medi-Cog assessment.  Given that patient endorses managing her own medications, there is concern at this time for patient's ability to safely resume that activity unassisted upon return home.  Decreased activity tolerance has also impacted patient's independence with self-care and functional mobility.  Patient anticipates getting increased assistance from family upon discharge from hospital.  Education will be provided regarding supervision of mediation management.  Home health OT is recommended for reinforcement of medication management as well as to identify and address any conditions that may present fall risks.  Acute OT will continue to follow patient while in the hospital.  Thank you for allowing us  to participate in the care of this patient.     If plan is discharge home, recommend the following:   Direct supervision/assist for medications management;A little help with walking and/or transfers;Assistance with cooking/housework;Direct supervision/assist for financial management;Help with stairs or ramp for entrance      Functional Status Assessment   Patient has had a recent decline in their functional status and demonstrates the ability to make significant improvements in function in a reasonable and predictable amount of time.       Precautions/Restrictions   Precautions Precautions: Fall;Other (comment) (Contact enteric) Recall of Precautions/Restrictions: Intact Restrictions Weight Bearing Restrictions Per Provider Order: No     Mobility Bed Mobility Overal bed mobility: Modified Independent   General bed mobility comments: Increased time and use of bed rails; no physical assist or cues required Patient Response: Cooperative, Restless  Transfers Overall transfer level: Modified independent Equipment used: 1 person hand held assist   General transfer comment: Patient maintained light CGA on therapist's arm      Balance Overall balance assessment: Needs assistance Sitting-balance support: No upper extremity supported, Feet unsupported Sitting balance-Leahy Scale: Normal    Standing balance support: Single extremity supported, During functional activity Standing balance-Leahy Scale: Good Standing balance comment: Slight unsteadiness; patient able to self-correct through CGA.     ADL either performed or assessed with clinical judgement   ADL Overall ADL's : Needs assistance/impaired Eating/Feeding: Independent   Grooming: Sitting;Set up   Upper Body Bathing: Contact guard assist;Sitting   Lower Body Bathing: Contact guard assist;Sitting/lateral leans   Upper Body Dressing : Contact guard assist;Sitting   Lower Body Dressing: Contact guard assist;Sit to/from stand   Toilet Transfer: Contact guard assist   Toileting- Clothing Manipulation and Hygiene: Total assistance Toileting - Clothing Manipulation Details (indicate cue type and reason): Patient on PureWick and fecal containment device due to Salmonella at time of evaluation.    Functional mobility during ADLs:  Contact guard assist General ADL Comments: Patient exhibited mild unsteadines on feet but maintained standing with CGA.  Vision Baseline Vision/History: 1 Wears glasses Ability to See in Adequate Light: 0 Adequate Patient Visual Report: No change from baseline Vision Assessment?: No apparent visual deficits            Pertinent Vitals/Pain Pain Assessment Faces Pain Scale: Hurts a little bit Pain Location: Patient reported mild low back pain well controlled at time of visit. Pain Descriptors / Indicators: Discomfort Pain Intervention(s): Monitored during session     Extremity/Trunk Assessment Upper Extremity Assessment Upper Extremity Assessment: Overall WFL for tasks assessed;Right hand dominant   Lower Extremity Assessment Lower Extremity Assessment: Defer to PT evaluation   Cervical / Trunk Assessment Cervical / Trunk Assessment: Normal   Communication Communication Communication: No apparent difficulties   Cognition Arousal: Alert Behavior During Therapy: WFL for tasks assessed/performed Cognition: Cognition impaired    Awareness: Intellectual awareness impaired, Online awareness intact Memory impairment (select all impairments): Short-term memory Attention impairment (select first level of impairment): Sustained attention, Alternating attention Executive functioning impairment (select all impairments): Organization, Problem solving OT - Cognition Comments: Patient scored 4/10 on the Medi-Cog assessment, exhibiting deficits in executive functioning, attention, and ST memory.   Following commands: Intact       General Comments   Fecal collection tube became disconnected.  Patient stood while gown was changed, peri area cleaned, and collection tube reconnected.           Home Living Family/patient expects to be discharged to:: Private residence   Available Help at Discharge: Available 24 hours/day Type of Home: House Home Access: Stairs to  enter [employer] of Steps: 3 Entrance Stairs-Rails: Right Home Layout: Two level Alternate Level Stairs-Number of Steps: 17 Alternate Level Stairs-Rails: Right Bathroom Shower/Tub: Chief Strategy Officer: Standard    Home Equipment: Agricultural consultant (2 wheels);Cane - single point;Tub bench;Other (comment) Lexicographer)   Additional Comments: Patient [place] acreage with children & grandchildren living in separate homes on the property.      Prior Functioning/Environment Prior Level of Function : Independent/Modified Independent   ADLs Comments: Patient reports regular participation in IADL activities including housekeeping, laundry, and cooking for herself and family members.    OT Problem List: Decreased cognition; decreased activity tolerance   OT Treatment/Interventions: Self-care/ADL training;Energy conservation;Therapeutic activities;Cognitive remediation/compensation;Patient/family education      OT Goals(Current goals can be found in the care plan section)   Acute Rehab OT Goals Patient Stated Goal: Return home and resume daily activities OT Goal Formulation: With patient Time For Goal Achievement: 03/08/24 Potential to Achieve Goals: Good ADL Goals Pt Will Perform Lower Body Dressing: with set-up Pt Will Transfer to Toilet: with modified independence;regular height toilet Pt Will Perform Toileting - Clothing Manipulation and hygiene: with modified independence Additional ADL Goal #1: Caregivers will verbalize understanding of medication management strategies to support patient's safe return home.   OT Frequency:  Min 2X/week       AM-PAC OT 6 Clicks Daily Activity     Outcome Measure Help from another person eating meals?: None Help from another person taking care of personal grooming?: None Help from another person toileting, which includes using toliet, bedpan, or urinal?: Total Help from another person bathing (including washing,  rinsing, drying)?: A Little Help from another person to put on and taking off regular upper body clothing?: A Little Help from another person to put on and taking off regular lower body clothing?: A Little 6 Click Score: 18   End of Session Nurse Communication: Mobility status;Other (comment) (Fecal collection  apparatus status)  Activity Tolerance: Patient tolerated treatment well Patient left: in bed;with call bell/phone within reach;with bed alarm set;with nursing/sitter in room  OT Visit Diagnosis: Unsteadiness on feet (R26.81);Other symptoms and signs involving cognitive function                Time: 8657-8575 OT Time Calculation (min): 42 min Charges:  OT General Charges $OT Visit: 1 Visit OT Evaluation $OT Eval Moderate Complexity: 1 Mod OT Treatments $Self Care/Home Management : 8-22 mins $Therapeutic Activity: 8-22 mins  Zachry Hopfensperger B. Ahni Bradwell, MS, OTR/L 02/23/2024, 3:08 PM

## 2024-02-23 NOTE — Consult Note (Addendum)
 [facility]  Total days of antibiotics 3       Reason for Consult: sepsis due to unknown source    Referring Physician: regalado  Principal Problem:   Acute metabolic encephalopathy Active Problems:   HLD (hyperlipidemia)   Chronic low back pain   Severe sepsis (HCC)   Acute cystitis   Leukocytosis   Generalized weakness   History of essential hypertension    HPI: [name] is a 78 y.o. female with history of ovarian/endometrial cancer, also hx of recurrent uti with MDRO kleb, who is admitted on 10/15 for encephalopathy, weakness and fatigue, which is not her baseline. On admit she was found to be febrile, hypotensive, leukocytosis of 19.5K, LA of 3. Infectious work up showed normal cxr, UA+ by in and out catheterization, thus possible uti. She is also on chronic pain medication that could have also impaired her sensorium in the setting of infection. She was started on ceftriaxone  for possible cystitis. On 10/16, she started to have diarrhea, thus cdiff and gi pathogen panel was ordered. In addition to starting metronidazole . GIP showed + salmonella, Urine cx + kleb and her blood cx in 4/4 bottles showing MR staph species. She did undergo imaging that showed colitis. She now remembers that she started to have abdominal pain on the day of admission. But she denies any dysuria.  She doesn't recall eating any under cooked meals, no restaurants, no egg consumption  Past Medical History:  Diagnosis Date   Arthritis    Bacteriuria 07/10/2023   Cancer (HCC)    ovarian,skin Ca. in mouth   Cataract    Bil cataracts removed   Chronic constipation    Chronic kidney disease    Chronic midline low back pain with sciatica    Colon polyp    large ascending colon polyp , scheduled for resection 02/ 2021   Full dentures    History of endometrial cancer 2012   s/p   TAH w/ BSO 11-02-2010   History of recurrent UTIs    History of sepsis    multiple times  (some due to e.coli) last sepsis 07/ 2019   History of small bowel obstruction    x2  ,  hx bowel resection   HOH (hard of hearing)    Hyperlipidemia    Hypertension    Lower urinary tract symptoms (LUTS) 07/10/2023   Retroperitoneal fibrosis    Substance abuse (HCC)    opiod dependancy   Ureteral obstruction, left urologist-- dr renda   secondary to adhesions/ retroperitoneal fibrosis, treated with ureteral stent   Vertigo    Wears glasses     Allergies:  Allergies  Allergen Reactions   Cashew Nut Oil Hives   Dog Epithelium Hives and Itching   Sulfa Antibiotics Other (See Comments)    blistering lips and mouth sores    Current antibiotics:   MEDICATIONS:  atorvastatin   40 mg Oral Daily   cyanocobalamin   1,000 mcg Intramuscular Daily   enoxaparin  (LOVENOX ) injection  40 mg Subcutaneous Q24H    Social History   Tobacco Use   Smoking status: Never   Smokeless tobacco: Never  Vaping Use   Vaping status: Never Used  Substance Use Topics   Alcohol use: No   Drug use: No    Family History  Problem Relation Age of Onset   Diabetes Mother    Lung cancer Mother    Diabetes Father    Liver cancer Father  Colon cancer Sister        close to 9 per pt   Diabetes Sister    Diabetes Sister    Hypertension Sister    Kidney disease Sister    Seizures Daughter    Diabetes Son    Breast cancer Neg Hx    Allergic rhinitis Neg Hx    Angioedema Neg Hx    Asthma Neg Hx    Atopy Neg Hx    Eczema Neg Hx    Immunodeficiency Neg Hx    Urticaria Neg Hx    Colon polyps Neg Hx    Esophageal cancer Neg Hx    Rectal cancer Neg Hx    Stomach cancer Neg Hx     Review of Systems - 12 point ros is negative except abdominal pain   OBJECTIVE: Temp:  [97.7 F (36.5 C)-99.1 F (37.3 C)] 97.9 F (36.6 C) (10/17 1504) Pulse Rate:  [66-78] 70 (10/17 1504) Resp:  [16-20] 20 (10/17 1504) BP: (113-132)/(54-67) 123/67 (10/17 1504) SpO2:  [91 %-96 %] 96 % (10/17  1504) Weight:  [77.9 kg] 77.9 kg (10/17 0500) Physical Exam  Constitutional:  oriented to person, place, and time. appears well-developed and well-nourished. No distress.  HENT: [place]/AT, PERRLA, no scleral icterus Mouth/Throat: Oropharynx is clear and moist. No oropharyngeal exudate.  Cardiovascular: Normal rate, regular rhythm and normal heart sounds. Exam reveals no gallop and no friction rub.  No murmur heard.  Pulmonary/Chest: Effort normal and breath sounds normal. No respiratory distress.  has no wheezes.  Neck = supple, no nuchal rigidity Abdominal: Soft. Bowel sounds are normal.  exhibits no distension. There is no tenderness. Rectal tube Lymphadenopathy: no cervical adenopathy. No axillary adenopathy Neurological: alert and oriented to person, place, and time.  Skin: Skin is warm and dry. No rash noted. No erythema.  Psychiatric: a normal mood and affect.  behavior is normal.   LABS: Results for orders placed or performed during the hospital encounter of 02/21/24 (from the past 48 hours)  Comprehensive metabolic panel     Status: Abnormal   Collection Time: 02/21/24  4:54 PM  Result Value Ref Range   Sodium 138 135 - 145 mmol/L   Potassium 4.0 3.5 - 5.1 mmol/L   Chloride 101 98 - 111 mmol/L   CO2 22 22 - 32 mmol/L   Glucose, Bld 114 (H) 70 - 99 mg/dL    Comment: Glucose reference range applies only to samples taken after fasting for at least 8 hours.   BUN 16 8 - 23 mg/dL   Creatinine, Ser 8.91 (H) 0.44 - 1.00 mg/dL   Calcium  9.6 8.9 - 10.3 mg/dL   Total Protein 7.0 6.5 - 8.1 g/dL   Albumin  4.3 3.5 - 5.0 g/dL   AST 29 15 - 41 U/L   ALT 16 0 - 44 U/L   Alkaline Phosphatase 58 38 - 126 U/L   Total Bilirubin 1.1 0.0 - 1.2 mg/dL   GFR, Estimated 52 (L) >60 mL/min    Comment: (NOTE) Calculated using the CKD-EPI Creatinine Equation (2021)    Anion gap 15 5 - 15    Comment: Performed at [facility], [address] [place], [place] [address]  CBC with  Differential     Status: Abnormal   Collection Time: 02/21/24  4:54 PM  Result Value Ref Range   WBC 19.5 (H) 4.0 - 10.5 K/uL   RBC 4.50 3.87 - 5.11 MIL/uL   Hemoglobin 14.1 12.0 - 15.0  g/dL   HCT 55.3 63.9 - 53.9 %   MCV 99.1 80.0 - 100.0 fL   MCH 31.3 26.0 - 34.0 pg   MCHC 31.6 30.0 - 36.0 g/dL   RDW 87.4 88.4 - 84.4 %   Platelets 278 150 - 400 K/uL   nRBC 0.0 0.0 - 0.2 %   Neutrophils Relative % 87 %   Neutro Abs 17.2 (H) 1.7 - 7.7 K/uL   Lymphocytes Relative 7 %   Lymphs Abs 1.3 0.7 - 4.0 K/uL   Monocytes Relative 5 %   Monocytes Absolute 0.9 0.1 - 1.0 K/uL   Eosinophils Relative 0 %   Eosinophils Absolute 0.0 0.0 - 0.5 K/uL   Basophils Relative 0 %   Basophils Absolute 0.0 0.0 - 0.1 K/uL   Immature Granulocytes 1 %   Abs Immature Granulocytes 0.14 (H) 0.00 - 0.07 K/uL    Comment: Performed at [facility], [address], [place], [place] [address]  I-Stat Lactic Acid, ED     Status: None   Collection Time: 02/21/24  5:00 PM  Result Value Ref Range   Lactic Acid, Venous 1.9 0.5 - 1.9 mmol/L  Resp panel by RT-PCR (RSV, Flu A&B, Covid) Anterior Nasal Swab     Status: None   Collection Time: 02/21/24  7:54 PM   Specimen: Anterior Nasal Swab  Result Value Ref Range   SARS Coronavirus 2 by RT PCR NEGATIVE NEGATIVE    Comment: (NOTE) SARS-CoV-2 target nucleic acids are NOT DETECTED.  The SARS-CoV-2 RNA is generally detectable in upper respiratory specimens during the acute phase of infection. The lowest concentration of SARS-CoV-2 viral copies this assay can detect is 138 copies/mL. A negative result does not preclude SARS-Cov-2 infection and should not be used as the sole basis for treatment or other patient management decisions. A negative result may occur with  improper specimen collection/handling, submission of specimen other than nasopharyngeal swab, presence of viral mutation(s) within the areas targeted by this assay, and inadequate number of  viral copies(<138 copies/mL). A negative result must be combined with clinical observations, patient history, and epidemiological information. The expected result is Negative.  Fact Sheet for Patients:  BloggerCourse.com  Fact Sheet for Healthcare Providers:  SeriousBroker.it  This test is no t yet approved or cleared by the [place]  FDA and  has been authorized for detection and/or diagnosis of SARS-CoV-2 by FDA under an Emergency Use Authorization (EUA). This EUA will remain  in effect (meaning this test can be used) for the duration of the COVID-19 declaration under Section 564(b)(1) of the Act, 21 U.S.C.section 360bbb-3(b)(1), unless the authorization is terminated  or revoked sooner.       Influenza A by PCR NEGATIVE NEGATIVE   Influenza B by PCR NEGATIVE NEGATIVE    Comment: (NOTE) The Xpert Xpress SARS-CoV-2/FLU/RSV plus assay is intended as an aid in the diagnosis of influenza from Nasopharyngeal swab specimens and should not be used as a sole basis for treatment. Nasal washings and aspirates are unacceptable for Xpert Xpress SARS-CoV-2/FLU/RSV testing.  Fact Sheet for Patients: BloggerCourse.com  Fact Sheet for Healthcare Providers: SeriousBroker.it  This test is not yet approved or cleared by the [place]  FDA and has been authorized for detection and/or diagnosis of SARS-CoV-2 by FDA under an Emergency Use Authorization (EUA). This EUA will remain in effect (meaning this test can be used) for the duration of the COVID-19 declaration under Section 564(b)(1) of the Act, 21 U.S.C. section 360bbb-3(b)(1), unless  the authorization is terminated or revoked.     Resp Syncytial Virus by PCR NEGATIVE NEGATIVE    Comment: (NOTE) Fact Sheet for Patients: BloggerCourse.com  Fact Sheet for Healthcare  Providers: SeriousBroker.it  This test is not yet approved or cleared by the [place]  FDA and has been authorized for detection and/or diagnosis of SARS-CoV-2 by FDA under an Emergency Use Authorization (EUA). This EUA will remain in effect (meaning this test can be used) for the duration of the COVID-19 declaration under Section 564(b)(1) of the Act, 21 U.S.C. section 360bbb-3(b)(1), unless the authorization is terminated or revoked.  Performed at [facility], [address], [place], [place] [address]   I-Stat Lactic Acid, ED     Status: Abnormal   Collection Time: 02/21/24  7:57 PM  Result Value Ref Range   Lactic Acid, Venous 3.0 (HH) 0.5 - 1.9 mmol/L   Comment NOTIFIED PHYSICIAN   Urinalysis, w/ Reflex to Culture (Infection Suspected) -Urine, Clean Catch     Status: Abnormal   Collection Time: 02/21/24  8:06 PM  Result Value Ref Range   Specimen Source URINE, CLEAN CATCH    Color, Urine AMBER (A) YELLOW    Comment: BIOCHEMICALS MAY BE AFFECTED BY COLOR   APPearance HAZY (A) CLEAR   Specific Gravity, Urine 1.019 1.005 - 1.030   pH 5.0 5.0 - 8.0   Glucose, UA NEGATIVE NEGATIVE mg/dL   Hgb urine dipstick SMALL (A) NEGATIVE   Bilirubin Urine NEGATIVE NEGATIVE   Ketones, ur 5 (A) NEGATIVE mg/dL   Protein, ur 30 (A) NEGATIVE mg/dL   Nitrite POSITIVE (A) NEGATIVE   Leukocytes,Ua LARGE (A) NEGATIVE   RBC / HPF 0-5 0 - 5 RBC/hpf   WBC, UA 21-50 0 - 5 WBC/hpf    Comment:        Reflex urine culture not performed if WBC <=10, OR if Squamous epithelial cells >5. If Squamous epithelial cells >5 suggest recollection.    Bacteria, UA MANY (A) NONE SEEN   Squamous Epithelial / HPF 0-5 0 - 5 /HPF   WBC Clumps PRESENT    Mucus PRESENT     Comment: Performed at [facility], [address] [place], [place] [address]  Urine Drug Screen     Status: None   Collection Time: 02/21/24  8:07 PM  Result Value Ref Range    Opiates NEGATIVE NEGATIVE   Cocaine NEGATIVE NEGATIVE   Benzodiazepines NEGATIVE NEGATIVE   Amphetamines NEGATIVE NEGATIVE   Tetrahydrocannabinol NEGATIVE NEGATIVE   Barbiturates NEGATIVE NEGATIVE   Methadone Scn, Ur NEGATIVE NEGATIVE   Fentanyl  NEGATIVE NEGATIVE    Comment: (NOTE) Drug screen is for Medical Purposes only. Positive results are preliminary only. If confirmation is needed, notify lab within 5 days.  Drug Class                 Cutoff (ng/mL) Amphetamine and metabolites 1000 Barbiturate and metabolites 200 Benzodiazepine              200 Opiates and metabolites     300 Cocaine and metabolites     300 THC                         50 Fentanyl                     5 Methadone  300  Trazodone  is metabolized in vivo to several metabolites,  including pharmacologically active m-CPP, which is excreted in the  urine.  Immunoassay screens for amphetamines and MDMA have potential  cross-reactivity with these compounds and may provide false positive  result.  Performed at [facility], [address], [place], [place] [address]   Urine Culture     Status: Abnormal (Preliminary result)   Collection Time: 02/21/24  8:07 PM   Specimen: Urine, Catheterized  Result Value Ref Range   Specimen Description      URINE, CATHETERIZED Performed at [facility], [address] [place], [place] [address]    Special Requests      NONE Performed at [facility], [address], [place], [place] [address]    Culture (A)     >=100,000 COLONIES/mL KLEBSIELLA PNEUMONIAE SUSCEPTIBILITIES TO FOLLOW Performed at [facility], [address], [place], [place] [address]    Report Status PENDING   Blood culture (routine x 2)     Status: None (Preliminary result)   Collection Time: 02/21/24  8:20 PM   Specimen: BLOOD RIGHT HAND  Result Value Ref Range   Specimen Description      BLOOD RIGHT HAND Performed  at [facility], [address], [place], [place] [address]    Special Requests      Blood Culture results may not be optimal due to an inadequate volume of blood received in culture bottles BOTTLES DRAWN AEROBIC AND ANAEROBIC Performed at [facility], [address], [place], [place] [address]    Culture  Setup Time      GRAM POSITIVE COCCI IN CLUSTERS ANAEROBIC BOTTLE ONLY CRITICAL RESULT CALLED TO, READ BACK BY AND VERIFIED WITH: PHARMD E JACKSON 02/22/2024 @ 2230 BY AB    Culture      GRAM POSITIVE COCCI TOO YOUNG TO READ Performed at [facility], [address] [place], [place] [address]    Report Status PENDING   Blood Culture ID Panel (Reflexed)     Status: Abnormal   Collection Time: 02/21/24  8:20 PM  Result Value Ref Range   Enterococcus faecalis NOT DETECTED NOT DETECTED   Enterococcus Faecium NOT DETECTED NOT DETECTED   Listeria monocytogenes NOT DETECTED NOT DETECTED   Staphylococcus species DETECTED (A) NOT DETECTED    Comment: CRITICAL RESULT CALLED TO, READ BACK BY AND VERIFIED WITH: PHARMD E JACKSON 02/22/2024 @ 2230 BY AB    Staphylococcus aureus (BCID) NOT DETECTED NOT DETECTED   Staphylococcus epidermidis DETECTED (A) NOT DETECTED    Comment: Methicillin (oxacillin) resistant coagulase negative staphylococcus. Possible blood culture contaminant (unless isolated from more than one blood culture draw or clinical case suggests pathogenicity). No antibiotic treatment is indicated for blood  culture contaminants. CRITICAL RESULT CALLED TO, READ BACK BY AND VERIFIED WITH: PHARMD E JACKSON 02/22/2024 @ 2230 BY AB    Staphylococcus lugdunensis NOT DETECTED NOT DETECTED   Streptococcus species NOT DETECTED NOT DETECTED   Streptococcus agalactiae NOT DETECTED NOT DETECTED   Streptococcus pneumoniae NOT DETECTED NOT DETECTED   Streptococcus pyogenes NOT DETECTED NOT DETECTED   A.calcoaceticus-baumannii NOT DETECTED NOT DETECTED    Bacteroides fragilis NOT DETECTED NOT DETECTED   Enterobacterales NOT DETECTED NOT DETECTED   Enterobacter cloacae complex NOT DETECTED NOT DETECTED   Escherichia coli NOT DETECTED NOT DETECTED   Klebsiella aerogenes NOT DETECTED NOT DETECTED   Klebsiella oxytoca NOT DETECTED NOT DETECTED  Klebsiella pneumoniae NOT DETECTED NOT DETECTED   Proteus species NOT DETECTED NOT DETECTED   Salmonella species NOT DETECTED NOT DETECTED   Serratia marcescens NOT DETECTED NOT DETECTED   Haemophilus influenzae NOT DETECTED NOT DETECTED   Neisseria meningitidis NOT DETECTED NOT DETECTED   Pseudomonas aeruginosa NOT DETECTED NOT DETECTED   Stenotrophomonas maltophilia NOT DETECTED NOT DETECTED   Candida albicans NOT DETECTED NOT DETECTED   Candida auris NOT DETECTED NOT DETECTED   Candida glabrata NOT DETECTED NOT DETECTED   Candida krusei NOT DETECTED NOT DETECTED   Candida parapsilosis NOT DETECTED NOT DETECTED   Candida tropicalis NOT DETECTED NOT DETECTED   Cryptococcus neoformans/gattii NOT DETECTED NOT DETECTED   Methicillin resistance mecA/C DETECTED (A) NOT DETECTED    Comment: CRITICAL RESULT CALLED TO, READ BACK BY AND VERIFIED WITH: PHARMD E JACKSON 02/22/2024 @ 2230 BY AB Performed at [facility], [address] [place], [place] [address]   Blood culture (routine x 2)     Status: None (Preliminary result)   Collection Time: 02/21/24  8:25 PM   Specimen: BLOOD  Result Value Ref Range   Specimen Description      BLOOD RIGHT ANTECUBITAL Performed at [facility], [address], [place], [place] [address]    Special Requests      Blood Culture results may not be optimal due to an inadequate volume of blood received in culture bottles BOTTLES DRAWN AEROBIC AND ANAEROBIC Performed at [facility], [address], [place], [place] [address]    Culture  Setup Time      GRAM POSITIVE COCCI IN CLUSTERS ANAEROBIC BOTTLE ONLY CRITICAL VALUE  NOTED.  VALUE IS CONSISTENT WITH PREVIOUSLY REPORTED AND CALLED VALUE.    Culture      GRAM POSITIVE COCCI TOO YOUNG TO READ Performed at [facility], [address] [place], [place] [address]    Report Status PENDING   Magnesium      Status: Abnormal   Collection Time: 02/21/24 11:42 PM  Result Value Ref Range   Magnesium  1.6 (L) 1.7 - 2.4 mg/dL    Comment: Performed at [facility], [address] [place], [place] [address]  Vitamin B12     Status: Abnormal   Collection Time: 02/21/24 11:42 PM  Result Value Ref Range   Vitamin B-12 174 (L) 180 - 914 pg/mL    Comment: Performed at [facility], [address] [place], [place] [address]  CK     Status: None   Collection Time: 02/21/24 11:42 PM  Result Value Ref Range   Total CK 123 38 - 234 U/L    Comment: Performed at [facility], [address] [place], [place] [address]  TSH     Status: Abnormal   Collection Time: 02/21/24 11:42 PM  Result Value Ref Range   TSH 0.208 (L) 0.350 - 4.500 uIU/mL    Comment: Performed at [facility], [address], [place], [place] [address]  Lactic acid, plasma     Status: Abnormal   Collection Time: 02/21/24 11:42 PM  Result Value Ref Range   Lactic Acid, Venous 2.9 (HH) 0.5 - 1.9 mmol/L    Comment: Critical Value, Read Back and verified with JANIFER PARAS RN @ 0030 ON 02/22/2024 BY MTA Performed at [facility], [address] [place], [place] [address]   CBC with Differential/Platelet     Status: Abnormal  Collection Time: 02/22/24  5:12 AM  Result Value Ref Range   WBC 14.1 (H) 4.0 - 10.5 K/uL   RBC 4.17 3.87 - 5.11 MIL/uL   Hemoglobin 13.5 12.0 - 15.0 g/dL   HCT 58.1 63.9 - 53.9 %   MCV 100.2 (H) 80.0 - 100.0 fL   MCH 32.4 26.0 - 34.0 pg   MCHC 32.3 30.0 - 36.0 g/dL   RDW 87.2 88.4 - 84.4 %   Platelets 205 150 - 400 K/uL   nRBC 0.0 0.0 - 0.2 %   Neutrophils Relative % 88 %    Neutro Abs 12.5 (H) 1.7 - 7.7 K/uL   Lymphocytes Relative 7 %   Lymphs Abs 1.0 0.7 - 4.0 K/uL   Monocytes Relative 4 %   Monocytes Absolute 0.6 0.1 - 1.0 K/uL   Eosinophils Relative 0 %   Eosinophils Absolute 0.0 0.0 - 0.5 K/uL   Basophils Relative 0 %   Basophils Absolute 0.0 0.0 - 0.1 K/uL   Immature Granulocytes 1 %   Abs Immature Granulocytes 0.12 (H) 0.00 - 0.07 K/uL    Comment: Performed at [facility], [address], [place], [place] [address]  Comprehensive metabolic panel with GFR     Status: Abnormal   Collection Time: 02/22/24  5:12 AM  Result Value Ref Range   Sodium 138 135 - 145 mmol/L   Potassium 4.0 3.5 - 5.1 mmol/L   Chloride 104 98 - 111 mmol/L   CO2 23 22 - 32 mmol/L   Glucose, Bld 104 (H) 70 - 99 mg/dL    Comment: Glucose reference range applies only to samples taken after fasting for at least 8 hours.   BUN 13 8 - 23 mg/dL   Creatinine, Ser 9.06 0.44 - 1.00 mg/dL   Calcium  8.9 8.9 - 10.3 mg/dL   Total Protein 5.7 (L) 6.5 - 8.1 g/dL   Albumin  3.4 (L) 3.5 - 5.0 g/dL   AST 33 15 - 41 U/L   ALT 14 0 - 44 U/L   Alkaline Phosphatase 46 38 - 126 U/L   Total Bilirubin 0.8 0.0 - 1.2 mg/dL   GFR, Estimated >39 >39 mL/min    Comment: (NOTE) Calculated using the CKD-EPI Creatinine Equation (2021)    Anion gap 11 5 - 15    Comment: Performed at [facility], [address] [place], [place] [address]  Magnesium      Status: None   Collection Time: 02/22/24  5:12 AM  Result Value Ref Range   Magnesium  1.9 1.7 - 2.4 mg/dL    Comment: Performed at [facility], [address], [place], [place] [address]  Protime-INR     Status: None   Collection Time: 02/22/24  5:12 AM  Result Value Ref Range   Prothrombin Time 14.4 11.4 - 15.2 seconds   INR 1.1 0.8 - 1.2    Comment: (NOTE) INR goal varies based on device and disease states. Performed at [facility], [address] [place], [place]  [address]   Lactic acid, plasma     Status: None   Collection Time: 02/22/24  5:12 AM  Result Value Ref Range   Lactic Acid, Venous 1.7 0.5 - 1.9 mmol/L    Comment: Performed at [facility], [address] [place], [place] [address]  T4, free     Status: None   Collection Time: 02/22/24  9:01 AM  Result Value Ref Range  Free T4 1.05 0.61 - 1.12 ng/dL    Comment: (NOTE) Biotin ingestion may interfere with free T4 tests. If the results are inconsistent with the TSH level, previous test results, or the clinical presentation, then consider biotin interference. If needed, order repeat testing after stopping biotin. Performed at [facility], [address] [place], [place] [address]   T3, free     Status: Abnormal   Collection Time: 02/22/24  9:01 AM  Result Value Ref Range   T3, Free 1.6 (L) 2.0 - 4.4 pg/mL    Comment: (NOTE) Performed At: [facility] [address] [place], [place] [id number] Jennette Shorter MD Ey:[id number]   C Difficile Quick Screen w PCR reflex     Status: None   Collection Time: 02/22/24  9:59 AM   Specimen: STOOL  Result Value Ref Range   C Diff antigen NEGATIVE NEGATIVE   C Diff toxin NEGATIVE NEGATIVE   C Diff interpretation No C. difficile detected.     Comment: Performed at [facility], [address] [place], [place] [address]  Gastrointestinal Panel by PCR , Stool     Status: Abnormal   Collection Time: 02/22/24  9:59 AM   Specimen: STOOL  Result Value Ref Range   Campylobacter species NOT DETECTED NOT DETECTED   Plesimonas shigelloides NOT DETECTED NOT DETECTED   Salmonella species DETECTED (A) NOT DETECTED    Comment: RESULT CALLED TO, READ BACK BY AND VERIFIED WITH: JOANNA FELARCA @0005  ON 02/23/24 SKL    Yersinia enterocolitica NOT DETECTED NOT DETECTED   Vibrio species NOT DETECTED NOT DETECTED   Vibrio cholerae NOT DETECTED NOT DETECTED   Enteroaggregative E coli (EAEC) NOT DETECTED  NOT DETECTED   Enteropathogenic E coli (EPEC) NOT DETECTED NOT DETECTED   Enterotoxigenic E coli (ETEC) NOT DETECTED NOT DETECTED   Shiga like toxin producing E coli (STEC) NOT DETECTED NOT DETECTED   Shigella/Enteroinvasive E coli (EIEC) NOT DETECTED NOT DETECTED   Cryptosporidium NOT DETECTED NOT DETECTED   Cyclospora cayetanensis NOT DETECTED NOT DETECTED   Entamoeba histolytica NOT DETECTED NOT DETECTED   Giardia lamblia NOT DETECTED NOT DETECTED   Adenovirus F40/41 NOT DETECTED NOT DETECTED   Astrovirus NOT DETECTED NOT DETECTED   Norovirus GI/GII NOT DETECTED NOT DETECTED   Rotavirus A NOT DETECTED NOT DETECTED   Sapovirus (I, II, IV, and V) NOT DETECTED NOT DETECTED    Comment: Performed at [facility], [address] [place], [place] [address]  CBC     Status: Abnormal   Collection Time: 02/23/24  8:47 AM  Result Value Ref Range   WBC 6.6 4.0 - 10.5 K/uL   RBC 3.54 (L) 3.87 - 5.11 MIL/uL   Hemoglobin 11.4 (L) 12.0 - 15.0 g/dL   HCT 65.1 (L) 63.9 - 53.9 %   MCV 98.3 80.0 - 100.0 fL   MCH 32.2 26.0 - 34.0 pg   MCHC 32.8 30.0 - 36.0 g/dL   RDW 87.7 88.4 - 84.4 %   Platelets 170 150 - 400 K/uL   nRBC 0.0 0.0 - 0.2 %    Comment: Performed at [facility], [address] [place], [place] [address]  Basic metabolic panel     Status: Abnormal   Collection Time: 02/23/24  8:47 AM  Result Value Ref Range   Sodium 138 135 - 145 mmol/L   Potassium 3.2 (L) 3.5 - 5.1 mmol/L   Chloride 107 98 - 111 mmol/L  CO2 22 22 - 32 mmol/L   Glucose, Bld 110 (H) 70 - 99 mg/dL    Comment: Glucose reference range applies only to samples taken after fasting for at least 8 hours.   BUN 10 8 - 23 mg/dL   Creatinine, Ser 9.15 0.44 - 1.00 mg/dL   Calcium  8.4 (L) 8.9 - 10.3 mg/dL   GFR, Estimated >39 >39 mL/min    Comment: (NOTE) Calculated using the CKD-EPI Creatinine Equation (2021)    Anion gap 9 5 - 15    Comment: Performed at [facility],  [address], [place], [place] [address]  Magnesium      Status: None   Collection Time: 02/23/24  8:47 AM  Result Value Ref Range   Magnesium  1.9 1.7 - 2.4 mg/dL    Comment: Performed at [facility], [address], [place], [place] [address]  Glucose, capillary     Status: Abnormal   Collection Time: 02/23/24 11:19 AM  Result Value Ref Range   Glucose-Capillary 101 (H) 70 - 99 mg/dL    Comment: Glucose reference range applies only to samples taken after fasting for at least 8 hours.    MICRO:  IMAGING: CT ABDOMEN PELVIS W CONTRAST Result Date: 02/22/2024 CLINICAL DATA:  Abdominal pain, acute. EXAM: CT ABDOMEN AND PELVIS WITH CONTRAST TECHNIQUE: Multidetector CT imaging of the abdomen and pelvis was performed using the standard protocol following bolus administration of intravenous contrast. RADIATION DOSE REDUCTION: This exam was performed according to the departmental dose-optimization program which includes automated exposure control, adjustment of the mA and/or kV according to patient size and/or use of iterative reconstruction technique. CONTRAST:  OMNIPAQUE  IOHEXOL  300 MG/ML  SOLN COMPARISON:  12/29/2021 FINDINGS: Lower chest: Dependent changes at the lung bases. No pleural effusions. Small amount of pericardial fluid. Hepatobiliary: Normal appearance of the liver. Gallbladder is decompressed. No biliary dilatation. Main portal venous system is patent. Pancreas: Unremarkable. No pancreatic ductal dilatation or surrounding inflammatory changes. Spleen: Normal in size without focal abnormality. Adrenals/Urinary Tract: Normal adrenal glands. Normal appearance of the right kidney without hydronephrosis. Atrophic left kidney with chronic dilatation the left renal calices and left renal pelvis. Layering stone in the left kidney lower pole measuring up to 1.0 cm. Left ureter is not dilated. Normal appearance of the urinary bladder. Stomach/Bowel: Postoperative changes  suggestive for a right hemicolectomy. There is wall thickening and pericolonic stranding involving the transverse colon and descending colon. Colonic wall thickening starts at the small bowel to colonic anastomosis. There is a relatively normal appearance of the sigmoid colon and rectum. Normal appearance of the stomach. No small bowel dilatation. Vascular/Lymphatic: Diffuse atherosclerotic calcifications in the abdominal aorta without aneurysm. The abdominal aorta is tortuous. No significant lymph node enlargement in the abdomen or pelvis. Reproductive: Status post hysterectomy. No adnexal masses. Other: No significant free fluid. Negative for free air. Laxity along the anterior abdominal wall. Heterogeneous soft tissue with small calcifications along the right anterior abdominal cavity. This area measures up to 3.5 cm and previously measured 4.1 cm. Findings probably represent an area of fat necrosis. Musculoskeletal: Bilateral hip replacements are located. Again noted is surgical fusion from L2 through L5. Severe disc space loss at L1-L2 and T12-L1. IMPRESSION: 1. Wall thickening and pericolonic stranding involving the transverse colon and descending colon. Findings are compatible with colitis. 2. Postoperative changes suggestive for a right hemicolectomy. 3. Atrophic left kidney with chronic dilatation of the left renal calices and left renal pelvis. Findings  may represent chronic UPJ obstruction/stricture. Left kidney stone. 4. Aortic Atherosclerosis (ICD10-I70.0). Electronically Signed   By: Juliene Balder M.D.   On: 02/22/2024 17:32   CT Head Wo Contrast Result Date: 02/21/2024 CLINICAL DATA:  Altered mental status. EXAM: CT HEAD WITHOUT CONTRAST TECHNIQUE: Contiguous axial images were obtained from the base of the skull through the vertex without intravenous contrast. RADIATION DOSE REDUCTION: This exam was performed according to the departmental dose-optimization program which includes automated exposure  control, adjustment of the mA and/or kV according to patient size and/or use of iterative reconstruction technique. COMPARISON:  None Available. FINDINGS: Brain: There is generalized cerebral atrophy with widening of the extra-axial spaces and ventricular dilatation. There are areas of decreased attenuation within the white matter tracts of the supratentorial brain, consistent with microvascular disease changes. Vascular: No hyperdense vessel or unexpected calcification. Skull: Normal. Negative for fracture or focal lesion. Sinuses/Orbits: There is mild posterior left maxillary sinus mucosal thickening. Other: None. IMPRESSION: 1. Generalized cerebral atrophy and microvascular disease changes of the supratentorial brain. 2. No acute intracranial abnormality. 3. Mild left maxillary sinus disease. Electronically Signed   By: Suzen Dials M.D.   On: 02/21/2024 19:11   DG Chest 2 View Result Date: 02/21/2024 CLINICAL DATA:  Weakness EXAM: CHEST - 2 VIEW COMPARISON:  09/07/2023 FINDINGS: The heart size and mediastinal contours are within normal limits. Both lungs are clear. The visualized skeletal structures are unremarkable. IMPRESSION: No active cardiopulmonary disease. Electronically Signed   By: Franky Crease M.D.   On: 02/21/2024 17:34    HISTORICAL MICRO/IMAGING  Assessment/Plan:  78yo F with salmonella colitis, +/- mdro kleb uti. Unclear if she also has MRSE bacteremia  Will change abtx to ceftriaxone  2gm iv to treat salmonella colitis.   Since she denies any dysuria, she probably does not need treatment for her history of mdro kleb,   MRSE bacteremia = will start daptomycin  Encephalopathy = improving.. suspect her impairment was due to her infection  Leukocytosis = improving  Continue on enteric precautions  evaluation of this patient requires complex antimicrobial therapy evaluation and counseling and isolation needs for disease transmission risk assessment and mitigation.    Dr  fayette to see on Monday. Dr berneda available for questions.

## 2024-02-23 NOTE — Progress Notes (Signed)
 Physical Therapy Treatment Patient Details Name: [name] MRN: [id number] DOB: [birth year] Today's Date: 02/23/2024   History of Present Illness [name] is a 78 y.o. female  who is admitted to [facility] on 02/21/2024 with acute metabolic encephalopathy due to severe sepsis secondary to acute cystitis after presenting from home to [facility] ED for evaluation of altered mental status. PMH:  chronic low back pain, essential hypertension, hyperlipidemia    PT Comments  Pt reports she feels better today. Pt was mod indep with bed mobility, and min assist with ambulation 63ft with HHA and Iv pole. Pt was reliant on me to provide balance and support with gait. Pt stated she was indep PTA and did not use a RW at home. I would recommend trying a RW next session and if d/c before therapy I would recommend using the RW at home. Spoke with family and pt that she is not independent with mobility at this point and she will need some assistance at home. Pt is used to doing everything herself and needs time to get stronger and regain her independence. Pt reports she does not want or need HHPT services. I communicated that I do feel it would be beneficial as she is a little weak and requires assistance with gait.Pt will continue to benefit from acute skilled PT to maximize mobility and independence for d/c home with family.    If plan is discharge home, recommend the following: A little help with walking and/or transfers;A little help with bathing/dressing/bathroom;Assistance with cooking/housework;Help with stairs or ramp for entrance;Assist for transportation   Can travel by private vehicle        Equipment Recommendations  None recommended by PT    Recommendations for Other Services       Precautions / Restrictions Precautions Precautions: Fall Restrictions Weight Bearing Restrictions Per Provider Order: No     Mobility  Bed Mobility Overal bed mobility: Modified Independent              General bed mobility comments: inc time and use of bed rails, but no physical assist or cues required    Transfers Overall transfer level: Modified independent Equipment used: Rolling walker (2 wheels)                    Ambulation/Gait Ambulation/Gait assistance: Min assist Gait Distance (Feet): 75 Feet   Gait Pattern/deviations: Step-through pattern, Decreased stride length, Antalgic Gait velocity: dec     General Gait Details: Recommend use of RW   Stairs             Wheelchair Mobility     Tilt Bed    Modified Rankin (Stroke Patients Only)       Balance Overall balance assessment: Needs assistance Sitting-balance support: No upper extremity supported, Feet supported Sitting balance-Leahy Scale: Normal     Standing balance support: During functional activity, No upper extremity supported Standing balance-Leahy Scale: Fair                              Hotel manager: No apparent difficulties  Cognition Arousal: Alert Behavior During Therapy: WFL for tasks assessed/performed   PT - Cognitive impairments: Safety/Judgement                         Following commands: Intact      Cueing Cueing Techniques: Verbal cues, Gestural cues  Exercises  General Comments        Pertinent Vitals/Pain Pain Assessment Pain Assessment: Faces Faces Pain Scale: Hurts a little bit Pain Location: back Pain Descriptors / Indicators: Discomfort Pain Intervention(s): Limited activity within patient's tolerance, Monitored during session    Home Living                          Prior Function            PT Goals (current goals can now be found in the care plan section) Progress towards PT goals: Progressing toward goals    Frequency    Min 2X/week      PT Plan      Co-evaluation              AM-PAC PT 6 Clicks Mobility   Outcome Measure  Help needed  turning from your back to your side while in a flat bed without using bedrails?: None Help needed moving from lying on your back to sitting on the side of a flat bed without using bedrails?: None Help needed moving to and from a bed to a chair (including a wheelchair)?: A Little Help needed standing up from a chair using your arms (e.g., wheelchair or bedside chair)?: None Help needed to walk in hospital room?: A Little Help needed climbing 3-5 steps with a railing? : A Little 6 Click Score: 21    End of Session   Activity Tolerance: Patient limited by fatigue Patient left: in bed;with call bell/phone within reach;with family/visitor present Nurse Communication: Mobility status PT Visit Diagnosis: Difficulty in walking, not elsewhere classified (R26.2);Muscle weakness (generalized) (M62.81)     Time: 8743-8679 PT Time Calculation (min) (ACUTE ONLY): 24 min  Charges:    $Gait Training: 8-22 mins $Self Care/Home Management: 8-22 PT General Charges $$ ACUTE PT VISIT: 1 Visit                    Ike Killings, PT   Pearce Littlefield Kerstine 02/23/2024, 1:23 PM

## 2024-02-23 NOTE — Progress Notes (Deleted)
   Subjective:    Patient ID: [name], female    DOB: 1945/07/23, 78 y.o.   MRN: [id number]   Chief Complaint: pain management  HPI  Pain assessment: Cause of pain- chronic back pain. Opoid dependence Pain location- *** Pain on scale of 1-10- *** Frequency- *** What increases pain-*** What makes pain Better-*** Effects on ADL - *** Any change in general medical condition-***  Current opioids rx- *** # meds rx- *** Effectiveness of current meds-*** Adverse reactions from pain meds-*** Morphine  equivalent- ***  Pill count performed-{yes/ no:20286} Last drug screen - *** ( high risk q78m, moderate risk q84m, low risk yearly ) Urine drug screen today- {yes/no:20286} Was the [facility] reviewed- ***  If yes were their any concerning findings? - ***   Overdose risk: ***    09/15/2020   10:41 AM  Opioid Risk   Alcohol 0  Illegal Drugs 0  Rx Drugs 0  Alcohol 0  Illegal Drugs 0  Rx Drugs 0  Age between 16-45 years  0  History of Preadolescent Sexual Abuse 0  Psychological Disease 0  Depression 0  Opioid Risk Tool Scoring 0  Opioid Risk Interpretation Low Risk     Pain contract signed on:   Patient Active Problem List   Diagnosis Date Noted   Acute metabolic encephalopathy 02/21/2024   Acute cystitis 02/21/2024   Leukocytosis 02/21/2024   Generalized weakness 02/21/2024   History of essential hypertension 02/21/2024   Gastroesophageal reflux disease without esophagitis 11/20/2023   Lower urinary tract symptoms (LUTS) 07/10/2023   Bacteriuria 07/10/2023   Severe sepsis (HCC) 07/22/2021   Colon polyp 08/23/2019   Adenomatous polyp of ascending colon 08/23/2019   Carpal tunnel syndrome of left wrist 03/16/2018   Carpal tunnel syndrome of right wrist 03/16/2018   Chronic low back pain    Peripheral neuropathy 02/08/2016   Depression 02/08/2016   OA (osteoarthritis) of hip 07/04/2014   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

## 2024-02-23 NOTE — Plan of Care (Signed)

## 2024-02-23 NOTE — Progress Notes (Addendum)
 PROGRESS NOTE    [name]  FMW:[id number] DOB: 1946-01-04 DOA: 02/21/2024 PCP: Gladis Mustard, FNP   Brief Narrative: 78 year old with past medical history significant for chronic low back pain, hypertension, hyperlipidemia presented on 12/22/2023 with acute metabolic encephalopathy thought to be related to severe sepsis secondary to acute cystitis.  Patient presented with altered mental status, patient has been confused over the last 2 days, increased lethargy, somnolence and generalized weakness.  Evaluation in the ED initially systolic blood pressure was in the 90s oxygen saturation 91 room air, tachypnea respiration rate 27, sodium 130 white blood cell 19, UA 21-50 red blood cells and large leukocytes.  CT head no evidence of acute intracranial process.  Chest x-ray negative.   Assessment & Plan:   Principal Problem:   Acute metabolic encephalopathy Active Problems:   HLD (hyperlipidemia)   Chronic low back pain   Severe sepsis (HCC)   Acute cystitis   Leukocytosis   Generalized weakness   History of essential hypertension   1-Acute metabolic encephalopathy in the setting of sepsis, UTI also component of B12 deficiency Component of polypharmacy: Percocet and Zanaflex  CT head no acute findings. Back to baseline.    2-Severe sepsis due to Acute cystitis, colitis salmonella infection.  - Patient presents with leukocytosis, lactic acidosis, source of infection UTI, tachypnea, tachycardia.  UA with 21-50 white blood cell. - White blood cell trending down 19---14--6.6 - Follow urine culture growing klebsiella pneumoniae.  and blood cultures growing gram positive cocci.  -Continue with ceftriaxone  and flagyl .   Blood cultures positive x 2 for gram-positive cocci Repeat blood cultures ID consulted patient has had back surgery, fusion and rods.   3-Diarrhea;  C diff negative GI pathogen positive for Salmonella.  CT abdomen and pelvis. Finding consistent with  colitis.  Continue with IV fluids and antibiotics.    4-Generalized weakness -In setting acute illness.  -Supplement B 12.  -TSH low, check free T 3 and free T4.   5-Lactic acidosis -In setting of sepsis.  -Continue with IV fluids, IV antibiotics.   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  She will need thyroid  function test in 4 to 6 weeks  7-B12 deficiency: -Start IM B12 injection, will need oral supplementation after.   Chronic low back pain - Continue with as needed percocet  Essential hypertension: -Hold BP medications in setting of sepsis and diarrhea. Lisinopril  and lasix .   Hypokalemia: Replace orally  Hyperlipidemia: -Continue Lipitor.    Estimated body mass index is 33.54 kg/m as calculated from the following:   Height as of this encounter: 5' (1.524 m).   Weight as of this encounter: 77.9 kg.   DVT prophylaxis: Lovenox  Code Status: Full code Family Communication: Disposition Plan:  Status is: Inpatient Remains inpatient appropriate because: management of sepsis     Consultants:  none  Procedures:  none  Antimicrobials:    Subjective: She is alert, started having diarrhea overnight. Report left LQ pain.  Asking for her pain medications.   Objective: Vitals:   02/22/24 1356 02/22/24 2021 02/23/24 0435 02/23/24 0500  BP: 100/67 (!) 113/54 122/63   Pulse: 82 78 68   Resp: 18 20 16    Temp: 99.7 F (37.6 C) 99.1 F (37.3 C) 98 F (36.7 C)   TempSrc: Oral Oral    SpO2: 95% 91% 94%   Weight:    77.9 kg  Height:        Intake/Output Summary (  Last 24 hours) at 02/23/2024 0736 Last data filed at 02/23/2024 0606 Gross per 24 hour  Intake 2849.62 ml  Output 1100 ml  Net 1749.62 ml   Filed Weights   02/21/24 2319 02/22/24 0453 02/23/24 0500  Weight: 77 kg 77 kg 77.9 kg    Examination:  General exam: No acute distress Respiratory system: Clear to auscultation Cardiovascular system:  S 1, S 2  RRR Gastrointestinal system: ABS present, soft, nt  Central nervous system: alert Extremities: no edema    Data Reviewed: I have personally reviewed following labs and imaging studies  CBC: Recent Labs  Lab 02/21/24 1654 02/22/24 0512  WBC 19.5* 14.1*  NEUTROABS 17.2* 12.5*  HGB 14.1 13.5  HCT 44.6 41.8  MCV 99.1 100.2*  PLT 278 205   Basic Metabolic Panel: Recent Labs  Lab 02/21/24 1654 02/21/24 2342 02/22/24 0512  NA 138  --  138  K 4.0  --  4.0  CL 101  --  104  CO2 22  --  23  GLUCOSE 114*  --  104*  BUN 16  --  13  CREATININE 1.08*  --  0.93  CALCIUM  9.6  --  8.9  MG  --  1.6* 1.9   GFR: Estimated Creatinine Clearance: 45.3 mL/min (by C-G formula based on SCr of 0.93 mg/dL). Liver Function Tests: Recent Labs  Lab 02/21/24 1654 02/22/24 0512  AST 29 33  ALT 16 14  ALKPHOS 58 46  BILITOT 1.1 0.8  PROT 7.0 5.7*  ALBUMIN  4.3 3.4*   No results for input(s): LIPASE, AMYLASE in the last 168 hours. No results for input(s): AMMONIA in the last 168 hours. Coagulation Profile: Recent Labs  Lab 02/22/24 0512  INR 1.1   Cardiac Enzymes: Recent Labs  Lab 02/21/24 2342  CKTOTAL 123   BNP (last 3 results) No results for input(s): PROBNP in the last 8760 hours. HbA1C: No results for input(s): HGBA1C in the last 72 hours. CBG: No results for input(s): GLUCAP in the last 168 hours. Lipid Profile: No results for input(s): CHOL, HDL, LDLCALC, TRIG, CHOLHDL, LDLDIRECT in the last 72 hours. Thyroid  Function Tests: Recent Labs    02/21/24 2342 02/22/24 0901  TSH 0.208*  --   FREET4  --  1.05   Anemia Panel: Recent Labs    02/21/24 2342  VITAMINB12 174*   Sepsis Labs: Recent Labs  Lab 02/21/24 1700 02/21/24 1957 02/21/24 2342 02/22/24 0512  LATICACIDVEN 1.9 3.0* 2.9* 1.7    Recent Results (from the past 240 hours)  Resp panel by RT-PCR (RSV, Flu A&B, Covid) Anterior Nasal Swab     Status: None   Collection Time:  02/21/24  7:54 PM   Specimen: Anterior Nasal Swab  Result Value Ref Range Status   SARS Coronavirus 2 by RT PCR NEGATIVE NEGATIVE Final    Comment: (NOTE) SARS-CoV-2 target nucleic acids are NOT DETECTED.  The SARS-CoV-2 RNA is generally detectable in upper respiratory specimens during the acute phase of infection. The lowest concentration of SARS-CoV-2 viral copies this assay can detect is 138 copies/mL. A negative result does not preclude SARS-Cov-2 infection and should not be used as the sole basis for treatment or other patient management decisions. A negative result may occur with  improper specimen collection/handling, submission of specimen other than nasopharyngeal swab, presence of viral mutation(s) within the areas targeted by this assay, and inadequate number of viral copies(<138 copies/mL). A negative result must be combined with clinical observations, patient history, and  epidemiological information. The expected result is Negative.  Fact Sheet for Patients:  BloggerCourse.com  Fact Sheet for Healthcare Providers:  SeriousBroker.it  This test is no t yet approved or cleared by the [place]  FDA and  has been authorized for detection and/or diagnosis of SARS-CoV-2 by FDA under an Emergency Use Authorization (EUA). This EUA will remain  in effect (meaning this test can be used) for the duration of the COVID-19 declaration under Section 564(b)(1) of the Act, 21 U.S.C.section 360bbb-3(b)(1), unless the authorization is terminated  or revoked sooner.       Influenza A by PCR NEGATIVE NEGATIVE Final   Influenza B by PCR NEGATIVE NEGATIVE Final    Comment: (NOTE) The Xpert Xpress SARS-CoV-2/FLU/RSV plus assay is intended as an aid in the diagnosis of influenza from Nasopharyngeal swab specimens and should not be used as a sole basis for treatment. Nasal washings and aspirates are unacceptable for Xpert Xpress  SARS-CoV-2/FLU/RSV testing.  Fact Sheet for Patients: BloggerCourse.com  Fact Sheet for Healthcare Providers: SeriousBroker.it  This test is not yet approved or cleared by the [place]  FDA and has been authorized for detection and/or diagnosis of SARS-CoV-2 by FDA under an Emergency Use Authorization (EUA). This EUA will remain in effect (meaning this test can be used) for the duration of the COVID-19 declaration under Section 564(b)(1) of the Act, 21 U.S.C. section 360bbb-3(b)(1), unless the authorization is terminated or revoked.     Resp Syncytial Virus by PCR NEGATIVE NEGATIVE Final    Comment: (NOTE) Fact Sheet for Patients: BloggerCourse.com  Fact Sheet for Healthcare Providers: SeriousBroker.it  This test is not yet approved or cleared by the [place]  FDA and has been authorized for detection and/or diagnosis of SARS-CoV-2 by FDA under an Emergency Use Authorization (EUA). This EUA will remain in effect (meaning this test can be used) for the duration of the COVID-19 declaration under Section 564(b)(1) of the Act, 21 U.S.C. section 360bbb-3(b)(1), unless the authorization is terminated or revoked.  Performed at [facility] [address], [place], [place] [address]   Blood culture (routine x 2)     Status: None (Preliminary result)   Collection Time: 02/21/24  8:20 PM   Specimen: BLOOD RIGHT HAND  Result Value Ref Range Status   Specimen Description   Final    BLOOD RIGHT HAND Performed at [facility], [address] [place], [place] [address]    Special Requests   Final    Blood Culture results may not be optimal due to an inadequate volume of blood received in culture bottles BOTTLES DRAWN AEROBIC AND ANAEROBIC Performed at [facility], [address], [place], [place] [address]    Culture  Setup Time    Final    GRAM POSITIVE COCCI IN CLUSTERS ANAEROBIC BOTTLE ONLY CRITICAL RESULT CALLED TO, READ BACK BY AND VERIFIED WITH: PHARMD E JACKSON 02/22/2024 @ 2230 BY AB    Culture   Final    GRAM POSITIVE COCCI TOO YOUNG TO READ Performed at [facility], [address], [place], [place] [address]    Report Status PENDING  Incomplete  Blood Culture ID Panel (Reflexed)     Status: Abnormal   Collection Time: 02/21/24  8:20 PM  Result Value Ref Range Status   Enterococcus faecalis NOT DETECTED NOT DETECTED Final   Enterococcus Faecium NOT DETECTED NOT DETECTED Final   Listeria monocytogenes NOT DETECTED NOT DETECTED Final   Staphylococcus species DETECTED (  A) NOT DETECTED Final    Comment: CRITICAL RESULT CALLED TO, READ BACK BY AND VERIFIED WITH: PHARMD E JACKSON 02/22/2024 @ 2230 BY AB    Staphylococcus aureus (BCID) NOT DETECTED NOT DETECTED Final   Staphylococcus epidermidis DETECTED (A) NOT DETECTED Final    Comment: Methicillin (oxacillin) resistant coagulase negative staphylococcus. Possible blood culture contaminant (unless isolated from more than one blood culture draw or clinical case suggests pathogenicity). No antibiotic treatment is indicated for blood  culture contaminants. CRITICAL RESULT CALLED TO, READ BACK BY AND VERIFIED WITH: PHARMD E JACKSON 02/22/2024 @ 2230 BY AB    Staphylococcus lugdunensis NOT DETECTED NOT DETECTED Final   Streptococcus species NOT DETECTED NOT DETECTED Final   Streptococcus agalactiae NOT DETECTED NOT DETECTED Final   Streptococcus pneumoniae NOT DETECTED NOT DETECTED Final   Streptococcus pyogenes NOT DETECTED NOT DETECTED Final   A.calcoaceticus-baumannii NOT DETECTED NOT DETECTED Final   Bacteroides fragilis NOT DETECTED NOT DETECTED Final   Enterobacterales NOT DETECTED NOT DETECTED Final   Enterobacter cloacae complex NOT DETECTED NOT DETECTED Final   Escherichia coli NOT DETECTED NOT DETECTED Final   Klebsiella aerogenes NOT  DETECTED NOT DETECTED Final   Klebsiella oxytoca NOT DETECTED NOT DETECTED Final   Klebsiella pneumoniae NOT DETECTED NOT DETECTED Final   Proteus species NOT DETECTED NOT DETECTED Final   Salmonella species NOT DETECTED NOT DETECTED Final   Serratia marcescens NOT DETECTED NOT DETECTED Final   Haemophilus influenzae NOT DETECTED NOT DETECTED Final   Neisseria meningitidis NOT DETECTED NOT DETECTED Final   Pseudomonas aeruginosa NOT DETECTED NOT DETECTED Final   Stenotrophomonas maltophilia NOT DETECTED NOT DETECTED Final   Candida albicans NOT DETECTED NOT DETECTED Final   Candida auris NOT DETECTED NOT DETECTED Final   Candida glabrata NOT DETECTED NOT DETECTED Final   Candida krusei NOT DETECTED NOT DETECTED Final   Candida parapsilosis NOT DETECTED NOT DETECTED Final   Candida tropicalis NOT DETECTED NOT DETECTED Final   Cryptococcus neoformans/gattii NOT DETECTED NOT DETECTED Final   Methicillin resistance mecA/C DETECTED (A) NOT DETECTED Final    Comment: CRITICAL RESULT CALLED TO, READ BACK BY AND VERIFIED WITH: PHARMD E JACKSON 02/22/2024 @ 2230 BY AB Performed at [facility], [address] [place], [place] [address]   Blood culture (routine x 2)     Status: None (Preliminary result)   Collection Time: 02/21/24  8:25 PM   Specimen: BLOOD  Result Value Ref Range Status   Specimen Description   Final    BLOOD RIGHT ANTECUBITAL Performed at [facility], [address], [place], [place] [address]    Special Requests   Final    Blood Culture results may not be optimal due to an inadequate volume of blood received in culture bottles BOTTLES DRAWN AEROBIC AND ANAEROBIC Performed at [facility], [address], [place], [place] [address]    Culture  Setup Time   Final    GRAM POSITIVE COCCI IN CLUSTERS ANAEROBIC BOTTLE ONLY CRITICAL VALUE NOTED.  VALUE IS CONSISTENT WITH PREVIOUSLY REPORTED AND CALLED VALUE.    Culture   Final     GRAM POSITIVE COCCI TOO YOUNG TO READ Performed at [facility], [address], [place], [place] [address]    Report Status PENDING  Incomplete  C Difficile Quick Screen w PCR reflex     Status: None   Collection Time: 02/22/24  9:59 AM   Specimen: STOOL  Result Value Ref  Range Status   C Diff antigen NEGATIVE NEGATIVE Final   C Diff toxin NEGATIVE NEGATIVE Final   C Diff interpretation No C. difficile detected.  Final    Comment: Performed at [facility], [address] [place], [place] [address]  Gastrointestinal Panel by PCR , Stool     Status: Abnormal   Collection Time: 02/22/24  9:59 AM   Specimen: STOOL  Result Value Ref Range Status   Campylobacter species NOT DETECTED NOT DETECTED Final   Plesimonas shigelloides NOT DETECTED NOT DETECTED Final   Salmonella species DETECTED (A) NOT DETECTED Final    Comment: RESULT CALLED TO, READ BACK BY AND VERIFIED WITH: JOANNA FELARCA @0005  ON 02/23/24 SKL    Yersinia enterocolitica NOT DETECTED NOT DETECTED Final   Vibrio species NOT DETECTED NOT DETECTED Final   Vibrio cholerae NOT DETECTED NOT DETECTED Final   Enteroaggregative E coli (EAEC) NOT DETECTED NOT DETECTED Final   Enteropathogenic E coli (EPEC) NOT DETECTED NOT DETECTED Final   Enterotoxigenic E coli (ETEC) NOT DETECTED NOT DETECTED Final   Shiga like toxin producing E coli (STEC) NOT DETECTED NOT DETECTED Final   Shigella/Enteroinvasive E coli (EIEC) NOT DETECTED NOT DETECTED Final   Cryptosporidium NOT DETECTED NOT DETECTED Final   Cyclospora cayetanensis NOT DETECTED NOT DETECTED Final   Entamoeba histolytica NOT DETECTED NOT DETECTED Final   Giardia lamblia NOT DETECTED NOT DETECTED Final   Adenovirus F40/41 NOT DETECTED NOT DETECTED Final   Astrovirus NOT DETECTED NOT DETECTED Final   Norovirus GI/GII NOT DETECTED NOT DETECTED Final   Rotavirus A NOT DETECTED NOT DETECTED Final   Sapovirus (I, II, IV, and V) NOT DETECTED NOT DETECTED  Final    Comment: Performed at [facility], [address], [place], [place] [address]         Radiology Studies: CT ABDOMEN PELVIS W CONTRAST Result Date: 02/22/2024 CLINICAL DATA:  Abdominal pain, acute. EXAM: CT ABDOMEN AND PELVIS WITH CONTRAST TECHNIQUE: Multidetector CT imaging of the abdomen and pelvis was performed using the standard protocol following bolus administration of intravenous contrast. RADIATION DOSE REDUCTION: This exam was performed according to the departmental dose-optimization program which includes automated exposure control, adjustment of the mA and/or kV according to patient size and/or use of iterative reconstruction technique. CONTRAST:  OMNIPAQUE  IOHEXOL  300 MG/ML  SOLN COMPARISON:  12/29/2021 FINDINGS: Lower chest: Dependent changes at the lung bases. No pleural effusions. Small amount of pericardial fluid. Hepatobiliary: Normal appearance of the liver. Gallbladder is decompressed. No biliary dilatation. Main portal venous system is patent. Pancreas: Unremarkable. No pancreatic ductal dilatation or surrounding inflammatory changes. Spleen: Normal in size without focal abnormality. Adrenals/Urinary Tract: Normal adrenal glands. Normal appearance of the right kidney without hydronephrosis. Atrophic left kidney with chronic dilatation the left renal calices and left renal pelvis. Layering stone in the left kidney lower pole measuring up to 1.0 cm. Left ureter is not dilated. Normal appearance of the urinary bladder. Stomach/Bowel: Postoperative changes suggestive for a right hemicolectomy. There is wall thickening and pericolonic stranding involving the transverse colon and descending colon. Colonic wall thickening starts at the small bowel to colonic anastomosis. There is a relatively normal appearance of the sigmoid colon and rectum. Normal appearance of the stomach. No small bowel dilatation. Vascular/Lymphatic: Diffuse atherosclerotic calcifications in the  abdominal aorta without aneurysm. The abdominal aorta is tortuous. No significant lymph node enlargement in the abdomen or pelvis. Reproductive: Status post hysterectomy. No adnexal masses. Other: No significant  free fluid. Negative for free air. Laxity along the anterior abdominal wall. Heterogeneous soft tissue with small calcifications along the right anterior abdominal cavity. This area measures up to 3.5 cm and previously measured 4.1 cm. Findings probably represent an area of fat necrosis. Musculoskeletal: Bilateral hip replacements are located. Again noted is surgical fusion from L2 through L5. Severe disc space loss at L1-L2 and T12-L1. IMPRESSION: 1. Wall thickening and pericolonic stranding involving the transverse colon and descending colon. Findings are compatible with colitis. 2. Postoperative changes suggestive for a right hemicolectomy. 3. Atrophic left kidney with chronic dilatation of the left renal calices and left renal pelvis. 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 Left kidney stone. 4. Aortic Atherosclerosis (ICD10-I70.0). Electronically Signed   By: Juliene Balder M.D.   On: 02/22/2024 17:32   CT Head Wo Contrast Result Date: 02/21/2024 CLINICAL DATA:  Altered mental status. EXAM: CT HEAD WITHOUT CONTRAST TECHNIQUE: Contiguous axial images were obtained from the base of the skull through the vertex without intravenous contrast. RADIATION DOSE REDUCTION: This exam was performed according to the departmental dose-optimization program which includes automated exposure control, adjustment of the mA and/or kV according to patient size and/or use of iterative reconstruction technique. COMPARISON:  None Available. FINDINGS: Brain: There is generalized cerebral atrophy with widening of the extra-axial spaces and ventricular dilatation. There are areas of decreased attenuation within the white matter tracts of the supratentorial brain, consistent with microvascular disease changes.  Vascular: No hyperdense vessel or unexpected calcification. Skull: Normal. Negative for fracture or focal lesion. Sinuses/Orbits: There is mild posterior left maxillary sinus mucosal thickening. Other: None. IMPRESSION: 1. Generalized cerebral atrophy and microvascular disease changes of the supratentorial brain. 2. No acute intracranial abnormality. 3. Mild left maxillary sinus disease. Electronically Signed   By: Suzen Dials M.D.   On: 02/21/2024 19:11   DG Chest 2 View Result Date: 02/21/2024 CLINICAL DATA:  Weakness EXAM: CHEST - 2 VIEW COMPARISON:  09/07/2023 FINDINGS: The heart size and mediastinal contours are within normal limits. Both lungs are clear. The visualized skeletal structures are unremarkable. IMPRESSION: No active cardiopulmonary disease. Electronically Signed   By: Franky Crease M.D.   On: 02/21/2024 17:34        Scheduled Meds:  atorvastatin   40 mg Oral Daily   cyanocobalamin   1,000 mcg Intramuscular Daily   enoxaparin  (LOVENOX ) injection  40 mg Subcutaneous Q24H   Continuous Infusions:  cefTRIAXone  (ROCEPHIN )  IV Stopped (02/22/24 1901)   lactated ringers  100 mL/hr at 02/23/24 0606   metronidazole  Stopped (02/22/24 2114)     LOS: 2 days    Time spent: 35 minutes    Jaquelyn Sakamoto A Termaine Roupp, MD [facility]   If 7PM-7AM, please contact night-coverage [email or web address]  02/23/2024, 7:36 AM

## 2024-02-24 DIAGNOSIS — G9341 Metabolic encephalopathy: Secondary | ICD-10-CM | POA: Diagnosis not present

## 2024-02-24 LAB — BASIC METABOLIC PANEL WITH GFR
Anion gap: 11 (ref 5–15)
BUN: 7 mg/dL — ABNORMAL LOW (ref 8–23)
CO2: 21 mmol/L — ABNORMAL LOW (ref 22–32)
Calcium: 8.5 mg/dL — ABNORMAL LOW (ref 8.9–10.3)
Chloride: 107 mmol/L (ref 98–111)
Creatinine, Ser: 0.62 mg/dL (ref 0.44–1.00)
GFR, Estimated: >60 mL/min (ref >60)
Glucose, Bld: 95 mg/dL (ref 70–99)
Potassium: 3.7 mmol/L (ref 3.5–5.1)
Sodium: 138 mmol/L (ref 135–145)

## 2024-02-24 LAB — CBC
HCT: 35 % — ABNORMAL LOW (ref 36.0–46.0)
Hemoglobin: 11.5 g/dL — ABNORMAL LOW (ref 12.0–15.0)
MCH: 31.9 pg (ref 26.0–34.0)
MCHC: 32.9 g/dL (ref 30.0–36.0)
MCV: 97.2 fL (ref 80.0–100.0)
Platelets: 208 K/uL (ref 150–400)
RBC: 3.6 MIL/uL — ABNORMAL LOW (ref 3.87–5.11)
RDW: 12.4 % (ref 11.5–15.5)
WBC: 6.2 K/uL (ref 4.0–10.5)
nRBC: 0 % (ref 0.0–0.2)

## 2024-02-24 LAB — URINE CULTURE: Culture: >=100000 — AB

## 2024-02-24 LAB — MAGNESIUM: Magnesium: 1.9 mg/dL (ref 1.7–2.4)

## 2024-02-24 MED ORDER — SIMETHICONE 80 MG PO CHEW
80.0000 mg | CHEWABLE_TABLET | Freq: Four times a day (QID) | ORAL | Status: DC | PRN
  Administered 2024-02-24: 80 mg via ORAL
  Filled 2024-02-24: qty 1

## 2024-02-24 MED ORDER — POTASSIUM CHLORIDE CRYS ER 20 MEQ PO TBCR
40.0000 meq | EXTENDED_RELEASE_TABLET | Freq: Once | ORAL | Status: AC
  Administered 2024-02-24: 40 meq via ORAL
  Filled 2024-02-24: qty 2

## 2024-02-24 MED ORDER — LACTATED RINGERS IV SOLN: INTRAVENOUS | Status: AC

## 2024-02-24 MED ORDER — LIDOCAINE 5 % EX PTCH
1.0000 | MEDICATED_PATCH | CUTANEOUS | Status: DC
  Administered 2024-02-24 – 2024-02-26 (×3): 1 via TRANSDERMAL
  Filled 2024-02-24 (×3): qty 1

## 2024-02-24 NOTE — Progress Notes (Signed)
 PROGRESS NOTE    [name]  FMW:[id number] DOB: 1945-07-20 DOA: 02/21/2024 PCP: Gladis Mustard, FNP   Brief Narrative: 78 year old with past medical history significant for chronic low back pain, hypertension, hyperlipidemia presented on 12/22/2023 with acute metabolic encephalopathy thought to be related to severe sepsis secondary to acute cystitis.  Patient presented with altered mental status, patient has been confused over the last 2 days, increased lethargy, somnolence and generalized weakness.  Evaluation in the ED initially systolic blood pressure was in the 90s oxygen saturation 91 room air, tachypnea respiration rate 27, sodium 130 white blood cell 19, UA 21-50 red blood cells and large leukocytes.  CT head no evidence of acute intracranial process.  Chest x-ray negative.   Assessment & Plan:   Principal Problem:   Acute metabolic encephalopathy Active Problems:   HLD (hyperlipidemia)   Chronic low back pain   Severe sepsis (HCC)   Acute cystitis   Leukocytosis   Generalized weakness   History of essential hypertension   1-Acute metabolic encephalopathy in the setting of sepsis, UTI also component of B12 deficiency Component of polypharmacy: Percocet and Zanaflex  CT head no acute findings. Back to baseline.    2-Severe sepsis due to Acute  colitis salmonella infection.  UTI ruled out per ID - Patient presents with leukocytosis, lactic acidosis, source of infection UTI, tachypnea, tachycardia.  UA with 21-50 white blood cell. - White blood cell trending down 19---14--6.6 - Follow urine culture growing klebsiella pneumoniae, pan resistant.   and blood cultures growing gram positive cocci.  -Continue with ceftriaxone    Blood cultures positive x 2 set growing Staph Epi Repeat blood cultures 10/17 ID consulted patient has had back surgery, fusion and rods.  Started on Daptomycin.   3-Diarrhea;  C diff negative GI pathogen positive for Salmonella.  CT  abdomen and pelvis. Finding consistent with colitis.  Continue with IV fluids and antibiotics.    4-Generalized weakness -In setting acute illness.  -Supplement B 12.  -TSH low, check free T 3 1.6  and free T4. 1.0 Needs repeat thyroid  function in 4 weeks.   5-Lactic acidosis -In setting of sepsis.  -Continue with IV fluids, IV antibiotics.   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  She will need thyroid  function test in 4 to 6 weeks  7-B12 deficiency: -Started IM B12 injection, will need oral supplementation after.   Chronic low back pain - Continue with as needed percocet  Essential hypertension: -Hold BP medications in setting of sepsis and diarrhea. Lisinopril  and lasix .   Hypokalemia: Replace orally  Hyperlipidemia: -Continue Lipitor.    Estimated body mass index is 34.44 kg/m as calculated from the following:   Height as of this encounter: 5' (1.524 m).   Weight as of this encounter: 80 kg.   DVT prophylaxis: Lovenox  Code Status: Full code Family Communication: son 10/17 Disposition Plan:  Status is: Inpatient Remains inpatient appropriate because: management of sepsis     Consultants:  none  Procedures:  none  Antimicrobials:    Subjective: Report diarrhea improving, denies dysuria or abdominal pain   Objective: Vitals:   02/23/24 1123 02/23/24 1504 02/24/24 0500 02/24/24 0506  BP: 132/65 123/67  136/66  Pulse: 66 70  (!) 56  Resp: 18 20  20   Temp: 97.7 F (36.5 C) 97.9 F (36.6 C)  (!) 97.3 F (36.3 C)  TempSrc:      SpO2: 95% 96%  97%  Weight:  80 kg   Height:        Intake/Output Summary (Last 24 hours) at 02/24/2024 1246 Last data filed at 02/24/2024 0900 Gross per 24 hour  Intake 1781.7 ml  Output 1550 ml  Net 231.7 ml   Filed Weights   02/22/24 0453 02/23/24 0500 02/24/24 0500  Weight: 77 kg 77.9 kg 80 kg    Examination:  General exam: No acute distress Respiratory system:  Clear to auscultation Cardiovascular system: S1, S2 regular rhythm and rate Gastrointestinal system: Bowel sounds present, soft nontender non distended Central nervous system: Alert is following commands Extremities: No edema    Data Reviewed: I have personally reviewed following labs and imaging studies  CBC: Recent Labs  Lab 02/21/24 1654 02/22/24 0512 02/23/24 0847 02/24/24 0826  WBC 19.5* 14.1* 6.6 6.2  NEUTROABS 17.2* 12.5*  --   --   HGB 14.1 13.5 11.4* 11.5*  HCT 44.6 41.8 34.8* 35.0*  MCV 99.1 100.2* 98.3 97.2  PLT 278 205 170 208   Basic Metabolic Panel: Recent Labs  Lab 02/21/24 1654 02/21/24 2342 02/22/24 0512 02/23/24 0847 02/24/24 0826  NA 138  --  138 138 138  K 4.0  --  4.0 3.2* 3.7  CL 101  --  104 107 107  CO2 22  --  23 22 21*  GLUCOSE 114*  --  104* 110* 95  BUN 16  --  13 10 7*  CREATININE 1.08*  --  0.93 0.84 0.62  CALCIUM  9.6  --  8.9 8.4* 8.5*  MG  --  1.6* 1.9 1.9 1.9   GFR: Estimated Creatinine Clearance: 53.4 mL/min (by C-G formula based on SCr of 0.62 mg/dL). Liver Function Tests: Recent Labs  Lab 02/21/24 1654 02/22/24 0512  AST 29 33  ALT 16 14  ALKPHOS 58 46  BILITOT 1.1 0.8  PROT 7.0 5.7*  ALBUMIN  4.3 3.4*   No results for input(s): LIPASE, AMYLASE in the last 168 hours. No results for input(s): AMMONIA in the last 168 hours. Coagulation Profile: Recent Labs  Lab 02/22/24 0512  INR 1.1   Cardiac Enzymes: Recent Labs  Lab 02/21/24 2342 02/23/24 1913  CKTOTAL 123 182   BNP (last 3 results) No results for input(s): PROBNP in the last 8760 hours. HbA1C: No results for input(s): HGBA1C in the last 72 hours. CBG: Recent Labs  Lab 02/23/24 1119  GLUCAP 101*   Lipid Profile: No results for input(s): CHOL, HDL, LDLCALC, TRIG, CHOLHDL, LDLDIRECT in the last 72 hours. Thyroid  Function Tests: Recent Labs    02/21/24 2342 02/22/24 0901  TSH 0.208*  --   FREET4  --  1.05  T3FREE  --  1.6*    Anemia Panel: Recent Labs    02/21/24 2342  VITAMINB12 174*   Sepsis Labs: Recent Labs  Lab 02/21/24 1700 02/21/24 1957 02/21/24 2342 02/22/24 0512  LATICACIDVEN 1.9 3.0* 2.9* 1.7    Recent Results (from the past 240 hours)  Resp panel by RT-PCR (RSV, Flu A&B, Covid) Anterior Nasal Swab     Status: None   Collection Time: 02/21/24  7:54 PM   Specimen: Anterior Nasal Swab  Result Value Ref Range Status   SARS Coronavirus 2 by RT PCR NEGATIVE NEGATIVE Final    Comment: (NOTE) SARS-CoV-2 target nucleic acids are NOT DETECTED.  The SARS-CoV-2 RNA is generally detectable in upper respiratory specimens during the acute phase of infection. The lowest concentration of SARS-CoV-2 viral copies this assay can detect is 138 copies/mL.  A negative result does not preclude SARS-Cov-2 infection and should not be used as the sole basis for treatment or other patient management decisions. A negative result may occur with  improper specimen collection/handling, submission of specimen other than nasopharyngeal swab, presence of viral mutation(s) within the areas targeted by this assay, and inadequate number of viral copies(<138 copies/mL). A negative result must be combined with clinical observations, patient history, and epidemiological information. The expected result is Negative.  Fact Sheet for Patients:  BloggerCourse.com  Fact Sheet for Healthcare Providers:  SeriousBroker.it  This test is no t yet approved or cleared by the [place]  FDA and  has been authorized for detection and/or diagnosis of SARS-CoV-2 by FDA under an Emergency Use Authorization (EUA). This EUA will remain  in effect (meaning this test can be used) for the duration of the COVID-19 declaration under Section 564(b)(1) of the Act, 21 U.S.C.section 360bbb-3(b)(1), unless the authorization is terminated  or revoked sooner.       Influenza A by PCR  NEGATIVE NEGATIVE Final   Influenza B by PCR NEGATIVE NEGATIVE Final    Comment: (NOTE) The Xpert Xpress SARS-CoV-2/FLU/RSV plus assay is intended as an aid in the diagnosis of influenza from Nasopharyngeal swab specimens and should not be used as a sole basis for treatment. Nasal washings and aspirates are unacceptable for Xpert Xpress SARS-CoV-2/FLU/RSV testing.  Fact Sheet for Patients: BloggerCourse.com  Fact Sheet for Healthcare Providers: SeriousBroker.it  This test is not yet approved or cleared by the [place]  FDA and has been authorized for detection and/or diagnosis of SARS-CoV-2 by FDA under an Emergency Use Authorization (EUA). This EUA will remain in effect (meaning this test can be used) for the duration of the COVID-19 declaration under Section 564(b)(1) of the Act, 21 U.S.C. section 360bbb-3(b)(1), unless the authorization is terminated or revoked.     Resp Syncytial Virus by PCR NEGATIVE NEGATIVE Final    Comment: (NOTE) Fact Sheet for Patients: BloggerCourse.com  Fact Sheet for Healthcare Providers: SeriousBroker.it  This test is not yet approved or cleared by the [place]  FDA and has been authorized for detection and/or diagnosis of SARS-CoV-2 by FDA under an Emergency Use Authorization (EUA). This EUA will remain in effect (meaning this test can be used) for the duration of the COVID-19 declaration under Section 564(b)(1) of the Act, 21 U.S.C. section 360bbb-3(b)(1), unless the authorization is terminated or revoked.  Performed at [facility], [address] [place], [place] [address]   Urine Culture     Status: Abnormal   Collection Time: 02/21/24  8:07 PM   Specimen: Urine, Catheterized  Result Value Ref Range Status   Specimen Description   Final    URINE, CATHETERIZED Performed at [facility], [address] [place], [place] [address]    Special Requests   Final    NONE Performed at [facility], [address], [place], [place] [address]    Culture (A)  Final    >=100,000 COLONIES/mL KLEBSIELLA PNEUMONIAE Confirmed Extended Spectrum Beta-Lactamase Producer (ESBL).  In bloodstream infections from ESBL organisms, carbapenems are preferred over piperacillin /tazobactam. They are shown to have a lower risk of mortality.    Report Status 02/24/2024 FINAL  Final   Organism ID, Bacteria KLEBSIELLA PNEUMONIAE (A)  Final      Susceptibility   Klebsiella pneumoniae - MIC*    AMPICILLIN  >=32 RESISTANT Resistant     CEFAZOLIN  (URINE) Value in next row Resistant      >=  32 RESISTANTThis is a modified FDA-approved test that has been validated and its performance characteristics determined by the reporting laboratory.  This laboratory is certified under the Clinical Laboratory Improvement Amendments CLIA as qualified to perform high complexity clinical laboratory testing.    CEFEPIME Value in next row Resistant      >=32 RESISTANTThis is a modified FDA-approved test that has been validated and its performance characteristics determined by the reporting laboratory.  This laboratory is certified under the Clinical Laboratory Improvement Amendments CLIA as qualified to perform high complexity clinical laboratory testing.    ERTAPENEM Value in next row Sensitive      >=32 RESISTANTThis is a modified FDA-approved test that has been validated and its performance characteristics determined by the reporting laboratory.  This laboratory is certified under the Clinical Laboratory Improvement Amendments CLIA as qualified to perform high complexity clinical laboratory testing.    CEFTRIAXONE  Value in next row Resistant      >=32 RESISTANTThis is a modified FDA-approved test that has been validated and its performance characteristics determined by the reporting laboratory.  This laboratory is  certified under the Clinical Laboratory Improvement Amendments CLIA as qualified to perform high complexity clinical laboratory testing.    CIPROFLOXACIN  Value in next row Resistant      >=32 RESISTANTThis is a modified FDA-approved test that has been validated and its performance characteristics determined by the reporting laboratory.  This laboratory is certified under the Clinical Laboratory Improvement Amendments CLIA as qualified to perform high complexity clinical laboratory testing.    GENTAMICIN  Value in next row Resistant      >=32 RESISTANTThis is a modified FDA-approved test that has been validated and its performance characteristics determined by the reporting laboratory.  This laboratory is certified under the Clinical Laboratory Improvement Amendments CLIA as qualified to perform high complexity clinical laboratory testing.    NITROFURANTOIN  Value in next row Intermediate      >=32 RESISTANTThis is a modified FDA-approved test that has been validated and its performance characteristics determined by the reporting laboratory.  This laboratory is certified under the Clinical Laboratory Improvement Amendments CLIA as qualified to perform high complexity clinical laboratory testing.    TRIMETH/SULFA Value in next row Sensitive      >=32 RESISTANTThis is a modified FDA-approved test that has been validated and its performance characteristics determined by the reporting laboratory.  This laboratory is certified under the Clinical Laboratory Improvement Amendments CLIA as qualified to perform high complexity clinical laboratory testing.    AMPICILLIN /SULBACTAM Value in next row Resistant      >=32 RESISTANTThis is a modified FDA-approved test that has been validated and its performance characteristics determined by the reporting laboratory.  This laboratory is certified under the Clinical Laboratory Improvement Amendments CLIA as qualified to perform high complexity clinical laboratory testing.     PIP/TAZO Value in next row Intermediate      32 INTERMEDIATEThis is a modified FDA-approved test that has been validated and its performance characteristics determined by the reporting laboratory.  This laboratory is certified under the Clinical Laboratory Improvement Amendments CLIA as qualified to perform high complexity clinical laboratory testing.    MEROPENEM Value in next row Sensitive      32 INTERMEDIATEThis is a modified FDA-approved test that has been validated and its performance characteristics determined by the reporting laboratory.  This laboratory is certified under the Clinical Laboratory Improvement Amendments CLIA as qualified to perform high complexity clinical laboratory testing.    * >=100,000 COLONIES/mL KLEBSIELLA  PNEUMONIAE  Blood culture (routine x 2)     Status: None (Preliminary result)   Collection Time: 02/21/24  8:20 PM   Specimen: BLOOD RIGHT HAND  Result Value Ref Range Status   Specimen Description   Final    BLOOD RIGHT HAND Performed at [facility], [address] [place], [place] [address]    Special Requests   Final    Blood Culture results may not be optimal due to an inadequate volume of blood received in culture bottles BOTTLES DRAWN AEROBIC AND ANAEROBIC Performed at [facility], [address], [place], [place] [address]    Culture  Setup Time   Final    GRAM POSITIVE COCCI IN CLUSTERS ANAEROBIC BOTTLE ONLY CRITICAL RESULT CALLED TO, READ BACK BY AND VERIFIED WITH: PHARMD E JACKSON 02/22/2024 @ 2230 BY AB    Culture   Final    GRAM POSITIVE COCCI IDENTIFICATION TO FOLLOW Performed at [facility], [address], [place], [place] [address]    Report Status PENDING  Incomplete  Blood Culture ID Panel (Reflexed)     Status: Abnormal   Collection Time: 02/21/24  8:20 PM  Result Value Ref Range Status   Enterococcus faecalis NOT DETECTED NOT DETECTED Final   Enterococcus Faecium NOT DETECTED NOT DETECTED Final    Listeria monocytogenes NOT DETECTED NOT DETECTED Final   Staphylococcus species DETECTED (A) NOT DETECTED Final    Comment: CRITICAL RESULT CALLED TO, READ BACK BY AND VERIFIED WITH: PHARMD E JACKSON 02/22/2024 @ 2230 BY AB    Staphylococcus aureus (BCID) NOT DETECTED NOT DETECTED Final   Staphylococcus epidermidis DETECTED (A) NOT DETECTED Final    Comment: Methicillin (oxacillin) resistant coagulase negative staphylococcus. Possible blood culture contaminant (unless isolated from more than one blood culture draw or clinical case suggests pathogenicity). No antibiotic treatment is indicated for blood  culture contaminants. CRITICAL RESULT CALLED TO, READ BACK BY AND VERIFIED WITH: PHARMD E JACKSON 02/22/2024 @ 2230 BY AB    Staphylococcus lugdunensis NOT DETECTED NOT DETECTED Final   Streptococcus species NOT DETECTED NOT DETECTED Final   Streptococcus agalactiae NOT DETECTED NOT DETECTED Final   Streptococcus pneumoniae NOT DETECTED NOT DETECTED Final   Streptococcus pyogenes NOT DETECTED NOT DETECTED Final   A.calcoaceticus-baumannii NOT DETECTED NOT DETECTED Final   Bacteroides fragilis NOT DETECTED NOT DETECTED Final   Enterobacterales NOT DETECTED NOT DETECTED Final   Enterobacter cloacae complex NOT DETECTED NOT DETECTED Final   Escherichia coli NOT DETECTED NOT DETECTED Final   Klebsiella aerogenes NOT DETECTED NOT DETECTED Final   Klebsiella oxytoca NOT DETECTED NOT DETECTED Final   Klebsiella pneumoniae NOT DETECTED NOT DETECTED Final   Proteus species NOT DETECTED NOT DETECTED Final   Salmonella species NOT DETECTED NOT DETECTED Final   Serratia marcescens NOT DETECTED NOT DETECTED Final   Haemophilus influenzae NOT DETECTED NOT DETECTED Final   Neisseria meningitidis NOT DETECTED NOT DETECTED Final   Pseudomonas aeruginosa NOT DETECTED NOT DETECTED Final   Stenotrophomonas maltophilia NOT DETECTED NOT DETECTED Final   Candida albicans NOT DETECTED NOT DETECTED Final    Candida auris NOT DETECTED NOT DETECTED Final   Candida glabrata NOT DETECTED NOT DETECTED Final   Candida krusei NOT DETECTED NOT DETECTED Final   Candida parapsilosis NOT DETECTED NOT DETECTED Final   Candida tropicalis NOT DETECTED NOT DETECTED Final   Cryptococcus neoformans/gattii NOT DETECTED NOT DETECTED Final   Methicillin resistance mecA/C DETECTED (A) NOT DETECTED Final  Comment: CRITICAL RESULT CALLED TO, READ BACK BY AND VERIFIED WITH: PHARMD E JACKSON 02/22/2024 @ 2230 BY AB Performed at [facility], [address] [place], [place] [address]   Blood culture (routine x 2)     Status: None (Preliminary result)   Collection Time: 02/21/24  8:25 PM   Specimen: BLOOD  Result Value Ref Range Status   Specimen Description   Final    BLOOD RIGHT ANTECUBITAL Performed at [facility], [address] [place], [place] [address]    Special Requests   Final    Blood Culture results may not be optimal due to an inadequate volume of blood received in culture bottles BOTTLES DRAWN AEROBIC AND ANAEROBIC Performed at [facility], [address] [place], [place] [address]    Culture  Setup Time   Final    GRAM POSITIVE COCCI IN CLUSTERS ANAEROBIC BOTTLE ONLY CRITICAL VALUE NOTED.  VALUE IS CONSISTENT WITH PREVIOUSLY REPORTED AND CALLED VALUE.    Culture   Final    GRAM POSITIVE COCCI IDENTIFICATION TO FOLLOW Performed at [facility], [address] [place], [place] [address]    Report Status PENDING  Incomplete  C Difficile Quick Screen w PCR reflex     Status: None   Collection Time: 02/22/24  9:59 AM   Specimen: STOOL  Result Value Ref Range Status   C Diff antigen NEGATIVE NEGATIVE Final   C Diff toxin NEGATIVE NEGATIVE Final   C Diff interpretation No C. difficile detected.  Final    Comment: Performed at [facility], [address], [place], [place] [address]  Gastrointestinal Panel by PCR , Stool      Status: Abnormal   Collection Time: 02/22/24  9:59 AM   Specimen: STOOL  Result Value Ref Range Status   Campylobacter species NOT DETECTED NOT DETECTED Final   Plesimonas shigelloides NOT DETECTED NOT DETECTED Final   Salmonella species DETECTED (A) NOT DETECTED Final    Comment: RESULT CALLED TO, READ BACK BY AND VERIFIED WITH: JOANNA FELARCA @0005  ON 02/23/24 SKL    Yersinia enterocolitica NOT DETECTED NOT DETECTED Final   Vibrio species NOT DETECTED NOT DETECTED Final   Vibrio cholerae NOT DETECTED NOT DETECTED Final   Enteroaggregative E coli (EAEC) NOT DETECTED NOT DETECTED Final   Enteropathogenic E coli (EPEC) NOT DETECTED NOT DETECTED Final   Enterotoxigenic E coli (ETEC) NOT DETECTED NOT DETECTED Final   Shiga like toxin producing E coli (STEC) NOT DETECTED NOT DETECTED Final   Shigella/Enteroinvasive E coli (EIEC) NOT DETECTED NOT DETECTED Final   Cryptosporidium NOT DETECTED NOT DETECTED Final   Cyclospora cayetanensis NOT DETECTED NOT DETECTED Final   Entamoeba histolytica NOT DETECTED NOT DETECTED Final   Giardia lamblia NOT DETECTED NOT DETECTED Final   Adenovirus F40/41 NOT DETECTED NOT DETECTED Final   Astrovirus NOT DETECTED NOT DETECTED Final   Norovirus GI/GII NOT DETECTED NOT DETECTED Final   Rotavirus A NOT DETECTED NOT DETECTED Final   Sapovirus (I, II, IV, and V) NOT DETECTED NOT DETECTED Final    Comment: Performed at [facility], [address] [place], [place] [address]  Culture, blood (Routine X 2) w Reflex to ID Panel     Status: None (Preliminary result)   Collection Time: 02/23/24 10:11 AM   Specimen: BLOOD LEFT HAND  Result Value Ref Range Status   Specimen Description   Final    BLOOD LEFT HAND BOTTLES  DRAWN AEROBIC ONLY Performed at [facility], [address], [place], [place] [address]    Special Requests   Final    Blood Culture results may not be optimal due to an inadequate volume of blood received in  culture bottles Performed at [facility], [address], [place], [place] [address]    Culture   Final    NO GROWTH < 24 HOURS Performed at [facility], [address] [place], [place] [address]    Report Status PENDING  Incomplete  Culture, blood (Routine X 2) w Reflex to ID Panel     Status: None (Preliminary result)   Collection Time: 02/23/24 10:17 AM   Specimen: BLOOD LEFT HAND  Result Value Ref Range Status   Specimen Description   Final    BLOOD LEFT HAND BOTTLES DRAWN AEROBIC ONLY Performed at [facility], [address], [place], [place] [address]    Special Requests   Final    Blood Culture results may not be optimal due to an inadequate volume of blood received in culture bottles Performed at [facility], [address] [place], [place] [address]    Culture   Final    NO GROWTH < 24 HOURS Performed at [facility], [address] [place], [place] [address]    Report Status PENDING  Incomplete         Radiology Studies: CT ABDOMEN PELVIS W CONTRAST Result Date: 02/22/2024 CLINICAL DATA:  Abdominal pain, acute. EXAM: CT ABDOMEN AND PELVIS WITH CONTRAST TECHNIQUE: Multidetector CT imaging of the abdomen and pelvis was performed using the standard protocol following bolus administration of intravenous contrast. RADIATION DOSE REDUCTION: This exam was performed according to the departmental dose-optimization program which includes automated exposure control, adjustment of the mA and/or kV according to patient size and/or use of iterative reconstruction technique. CONTRAST:  OMNIPAQUE  IOHEXOL  300 MG/ML  SOLN COMPARISON:  12/29/2021 FINDINGS: Lower chest: Dependent changes at the lung bases. No pleural effusions. Small amount of pericardial fluid. Hepatobiliary: Normal appearance of the liver. Gallbladder is decompressed. No biliary dilatation. Main portal venous system is patent. Pancreas:  Unremarkable. No pancreatic ductal dilatation or surrounding inflammatory changes. Spleen: Normal in size without focal abnormality. Adrenals/Urinary Tract: Normal adrenal glands. Normal appearance of the right kidney without hydronephrosis. Atrophic left kidney with chronic dilatation the left renal calices and left renal pelvis. Layering stone in the left kidney lower pole measuring up to 1.0 cm. Left ureter is not dilated. Normal appearance of the urinary bladder. Stomach/Bowel: Postoperative changes suggestive for a right hemicolectomy. There is wall thickening and pericolonic stranding involving the transverse colon and descending colon. Colonic wall thickening starts at the small bowel to colonic anastomosis. There is a relatively normal appearance of the sigmoid colon and rectum. Normal appearance of the stomach. No small bowel dilatation. Vascular/Lymphatic: Diffuse atherosclerotic calcifications in the abdominal aorta without aneurysm. The abdominal aorta is tortuous. No significant lymph node enlargement in the abdomen or pelvis. Reproductive: Status post hysterectomy. No adnexal masses. Other: No significant free fluid. Negative for free air. Laxity along the anterior abdominal wall. Heterogeneous soft tissue with small calcifications along the right anterior abdominal cavity. This area measures up to 3.5 cm and previously measured 4.1 cm. Findings probably represent an area of fat necrosis. Musculoskeletal: Bilateral hip replacements are located. Again noted is surgical fusion from L2 through L5. Severe disc space loss at L1-L2 and T12-L1. IMPRESSION:  1. Wall thickening and pericolonic stranding involving the transverse colon and descending colon. Findings are compatible with colitis. 2. Postoperative changes suggestive for a right hemicolectomy. 3. Atrophic left kidney with chronic dilatation of the left renal calices and left renal pelvis. 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 Left  kidney stone. 4. Aortic Atherosclerosis (ICD10-I70.0). Electronically Signed   By: Juliene Balder M.D.   On: 02/22/2024 17:32        Scheduled Meds:  atorvastatin   40 mg Oral Daily   cyanocobalamin   1,000 mcg Intramuscular Daily   enoxaparin  (LOVENOX ) injection  40 mg Subcutaneous Q24H   lidocaine   1 patch Transdermal Q24H   Continuous Infusions:  cefTRIAXone  (ROCEPHIN )  IV     DAPTOmycin Stopped (02/23/24 1902)     LOS: 3 days    Time spent: 35 minutes    Torey Reinard A Salam Chesterfield, MD [facility]   If 7PM-7AM, please contact night-coverage [email or web address]  02/24/2024, 12:46 PM

## 2024-02-24 NOTE — Plan of Care (Incomplete)

## 2024-02-25 DIAGNOSIS — G9341 Metabolic encephalopathy: Secondary | ICD-10-CM | POA: Diagnosis not present

## 2024-02-25 LAB — BASIC METABOLIC PANEL WITH GFR
Anion gap: 10 (ref 5–15)
BUN: <5 mg/dL — ABNORMAL LOW (ref 8–23)
CO2: 22 mmol/L (ref 22–32)
Calcium: 8.4 mg/dL — ABNORMAL LOW (ref 8.9–10.3)
Chloride: 108 mmol/L (ref 98–111)
Creatinine, Ser: 0.56 mg/dL (ref 0.44–1.00)
GFR, Estimated: >60 mL/min (ref >60)
Glucose, Bld: 90 mg/dL (ref 70–99)
Potassium: 3.9 mmol/L (ref 3.5–5.1)
Sodium: 140 mmol/L (ref 135–145)

## 2024-02-25 LAB — CBC
HCT: 32.9 % — ABNORMAL LOW (ref 36.0–46.0)
Hemoglobin: 11 g/dL — ABNORMAL LOW (ref 12.0–15.0)
MCH: 32.4 pg (ref 26.0–34.0)
MCHC: 33.4 g/dL (ref 30.0–36.0)
MCV: 97.1 fL (ref 80.0–100.0)
Platelets: 207 K/uL (ref 150–400)
RBC: 3.39 MIL/uL — ABNORMAL LOW (ref 3.87–5.11)
RDW: 12.4 % (ref 11.5–15.5)
WBC: 6.8 K/uL (ref 4.0–10.5)
nRBC: 0 % (ref 0.0–0.2)

## 2024-02-25 LAB — CULTURE, BLOOD (ROUTINE X 2)

## 2024-02-25 NOTE — Progress Notes (Signed)
 ID brief note ( weekend cross coverage)  Afebrile I have asked micro to run sensi for staph epi from both sets of blood drawn on 10/15.   Results for orders placed or performed during the hospital encounter of 02/21/24  Resp panel by RT-PCR (RSV, Flu A&B, Covid) Anterior Nasal Swab     Status: None   Collection Time: 02/21/24  7:54 PM   Specimen: Anterior Nasal Swab  Result Value Ref Range Status   SARS Coronavirus 2 by RT PCR NEGATIVE NEGATIVE Final    Comment: (NOTE) SARS-CoV-2 target nucleic acids are NOT DETECTED.  The SARS-CoV-2 RNA is generally detectable in upper respiratory specimens during the acute phase of infection. The lowest concentration of SARS-CoV-2 viral copies this assay can detect is 138 copies/mL. A negative result does not preclude SARS-Cov-2 infection and should not be used as the sole basis for treatment or other patient management decisions. A negative result may occur with  improper specimen collection/handling, submission of specimen other than nasopharyngeal swab, presence of viral mutation(s) within the areas targeted by this assay, and inadequate number of viral copies(<138 copies/mL). A negative result must be combined with clinical observations, patient history, and epidemiological information. The expected result is Negative.  Fact Sheet for Patients:  BloggerCourse.com  Fact Sheet for Healthcare Providers:  SeriousBroker.it  This test is no t yet approved or cleared by the [place]  FDA and  has been authorized for detection and/or diagnosis of SARS-CoV-2 by FDA under an Emergency Use Authorization (EUA). This EUA will remain  in effect (meaning this test can be used) for the duration of the COVID-19 declaration under Section 564(b)(1) of the Act, 21 U.S.C.section 360bbb-3(b)(1), unless the authorization is terminated  or revoked sooner.       Influenza A by PCR NEGATIVE NEGATIVE Final    Influenza B by PCR NEGATIVE NEGATIVE Final    Comment: (NOTE) The Xpert Xpress SARS-CoV-2/FLU/RSV plus assay is intended as an aid in the diagnosis of influenza from Nasopharyngeal swab specimens and should not be used as a sole basis for treatment. Nasal washings and aspirates are unacceptable for Xpert Xpress SARS-CoV-2/FLU/RSV testing.  Fact Sheet for Patients: BloggerCourse.com  Fact Sheet for Healthcare Providers: SeriousBroker.it  This test is not yet approved or cleared by the [place]  FDA and has been authorized for detection and/or diagnosis of SARS-CoV-2 by FDA under an Emergency Use Authorization (EUA). This EUA will remain in effect (meaning this test can be used) for the duration of the COVID-19 declaration under Section 564(b)(1) of the Act, 21 U.S.C. section 360bbb-3(b)(1), unless the authorization is terminated or revoked.     Resp Syncytial Virus by PCR NEGATIVE NEGATIVE Final    Comment: (NOTE) Fact Sheet for Patients: BloggerCourse.com  Fact Sheet for Healthcare Providers: SeriousBroker.it  This test is not yet approved or cleared by the [place]  FDA and has been authorized for detection and/or diagnosis of SARS-CoV-2 by FDA under an Emergency Use Authorization (EUA). This EUA will remain in effect (meaning this test can be used) for the duration of the COVID-19 declaration under Section 564(b)(1) of the Act, 21 U.S.C. section 360bbb-3(b)(1), unless the authorization is terminated or revoked.  Performed at [facility], [address], [place], [place] [address]   Urine Culture     Status: Abnormal   Collection Time: 02/21/24  8:07 PM   Specimen: Urine, Catheterized  Result Value Ref Range Status   Specimen Description   Final    URINE,  CATHETERIZED Performed at [facility], [address]  [place], [place] [address]    Special Requests   Final    NONE Performed at [facility] [address] [place], [place] [address]    Culture (A)  Final    >=100,000 COLONIES/mL KLEBSIELLA PNEUMONIAE Confirmed Extended Spectrum Beta-Lactamase Producer (ESBL).  In bloodstream infections from ESBL organisms, carbapenems are preferred over piperacillin /tazobactam. They are shown to have a lower risk of mortality.    Report Status 02/24/2024 FINAL  Final   Organism ID, Bacteria KLEBSIELLA PNEUMONIAE (A)  Final      Susceptibility   Klebsiella pneumoniae - MIC*    AMPICILLIN  >=32 RESISTANT Resistant     CEFAZOLIN  (URINE) Value in next row Resistant      >=32 RESISTANTThis is a modified FDA-approved test that has been validated and its performance characteristics determined by the reporting laboratory.  This laboratory is certified under the Clinical Laboratory Improvement Amendments CLIA as qualified to perform high complexity clinical laboratory testing.    CEFEPIME Value in next row Resistant      >=32 RESISTANTThis is a modified FDA-approved test that has been validated and its performance characteristics determined by the reporting laboratory.  This laboratory is certified under the Clinical Laboratory Improvement Amendments CLIA as qualified to perform high complexity clinical laboratory testing.    ERTAPENEM Value in next row Sensitive      >=32 RESISTANTThis is a modified FDA-approved test that has been validated and its performance characteristics determined by the reporting laboratory.  This laboratory is certified under the Clinical Laboratory Improvement Amendments CLIA as qualified to perform high complexity clinical laboratory testing.    CEFTRIAXONE  Value in next row Resistant      >=32 RESISTANTThis is a modified FDA-approved test that has been validated and its performance characteristics determined by the reporting laboratory.  This laboratory is certified under the  Clinical Laboratory Improvement Amendments CLIA as qualified to perform high complexity clinical laboratory testing.    CIPROFLOXACIN  Value in next row Resistant      >=32 RESISTANTThis is a modified FDA-approved test that has been validated and its performance characteristics determined by the reporting laboratory.  This laboratory is certified under the Clinical Laboratory Improvement Amendments CLIA as qualified to perform high complexity clinical laboratory testing.    GENTAMICIN  Value in next row Resistant      >=32 RESISTANTThis is a modified FDA-approved test that has been validated and its performance characteristics determined by the reporting laboratory.  This laboratory is certified under the Clinical Laboratory Improvement Amendments CLIA as qualified to perform high complexity clinical laboratory testing.    NITROFURANTOIN  Value in next row Intermediate      >=32 RESISTANTThis is a modified FDA-approved test that has been validated and its performance characteristics determined by the reporting laboratory.  This laboratory is certified under the Clinical Laboratory Improvement Amendments CLIA as qualified to perform high complexity clinical laboratory testing.    TRIMETH/SULFA Value in next row Sensitive      >=32 RESISTANTThis is a modified FDA-approved test that has been validated and its performance characteristics determined by the reporting laboratory.  This laboratory is certified under the Clinical Laboratory Improvement Amendments CLIA as qualified to perform high complexity clinical laboratory testing.    AMPICILLIN /SULBACTAM Value in next row Resistant      >=32 RESISTANTThis is a modified FDA-approved test that has been validated and its performance characteristics determined by the reporting laboratory.  This laboratory  is certified under the Clinical Laboratory Improvement Amendments CLIA as qualified to perform high complexity clinical laboratory testing.    PIP/TAZO Value in  next row Intermediate      32 INTERMEDIATEThis is a modified FDA-approved test that has been validated and its performance characteristics determined by the reporting laboratory.  This laboratory is certified under the Clinical Laboratory Improvement Amendments CLIA as qualified to perform high complexity clinical laboratory testing.    MEROPENEM Value in next row Sensitive      32 INTERMEDIATEThis is a modified FDA-approved test that has been validated and its performance characteristics determined by the reporting laboratory.  This laboratory is certified under the Clinical Laboratory Improvement Amendments CLIA as qualified to perform high complexity clinical laboratory testing.    * >=100,000 COLONIES/mL KLEBSIELLA PNEUMONIAE  Blood culture (routine x 2)     Status: Abnormal   Collection Time: 02/21/24  8:20 PM   Specimen: BLOOD RIGHT HAND  Result Value Ref Range Status   Specimen Description   Final    BLOOD RIGHT HAND Performed at [facility], [address], [place], [place] [address]    Special Requests   Final    Blood Culture results may not be optimal due to an inadequate volume of blood received in culture bottles BOTTLES DRAWN AEROBIC AND ANAEROBIC Performed at [facility], [address] [place], [place] [address]    Culture  Setup Time   Final    GRAM POSITIVE COCCI IN CLUSTERS ANAEROBIC BOTTLE ONLY CRITICAL RESULT CALLED TO, READ BACK BY AND VERIFIED WITH: PHARMD E JACKSON 02/22/2024 @ 2230 BY AB Performed at [facility], [address] [place], [place] [address]    Culture STAPHYLOCOCCUS EPIDERMIDIS (A)  Final   Report Status 02/25/2024 FINAL  Final   Organism ID, Bacteria STAPHYLOCOCCUS EPIDERMIDIS  Final      Susceptibility   Staphylococcus epidermidis - MIC*    CIPROFLOXACIN  <=0.5 SENSITIVE Sensitive     ERYTHROMYCIN >=8 RESISTANT Resistant     GENTAMICIN  <=0.5 SENSITIVE Sensitive     OXACILLIN >=4 RESISTANT Resistant      TETRACYCLINE <=1 SENSITIVE Sensitive     VANCOMYCIN  1 SENSITIVE Sensitive     TRIMETH/SULFA <=10 SENSITIVE Sensitive     CLINDAMYCIN  >=8 RESISTANT Resistant     RIFAMPIN <=0.5 SENSITIVE Sensitive     Inducible Clindamycin  NEGATIVE Sensitive     * STAPHYLOCOCCUS EPIDERMIDIS  Blood Culture ID Panel (Reflexed)     Status: Abnormal   Collection Time: 02/21/24  8:20 PM  Result Value Ref Range Status   Enterococcus faecalis NOT DETECTED NOT DETECTED Final   Enterococcus Faecium NOT DETECTED NOT DETECTED Final   Listeria monocytogenes NOT DETECTED NOT DETECTED Final   Staphylococcus species DETECTED (A) NOT DETECTED Final    Comment: CRITICAL RESULT CALLED TO, READ BACK BY AND VERIFIED WITH: PHARMD E JACKSON 02/22/2024 @ 2230 BY AB    Staphylococcus aureus (BCID) NOT DETECTED NOT DETECTED Final   Staphylococcus epidermidis DETECTED (A) NOT DETECTED Final    Comment: Methicillin (oxacillin) resistant coagulase negative staphylococcus. Possible blood culture contaminant (unless isolated from more than one blood culture draw or clinical case suggests pathogenicity). No antibiotic treatment is indicated for blood  culture contaminants. CRITICAL RESULT CALLED TO, READ BACK BY AND VERIFIED WITH: PHARMD E JACKSON 02/22/2024 @ 2230 BY AB    Staphylococcus lugdunensis NOT DETECTED NOT DETECTED Final   Streptococcus species NOT DETECTED NOT DETECTED Final  Streptococcus agalactiae NOT DETECTED NOT DETECTED Final   Streptococcus pneumoniae NOT DETECTED NOT DETECTED Final   Streptococcus pyogenes NOT DETECTED NOT DETECTED Final   A.calcoaceticus-baumannii NOT DETECTED NOT DETECTED Final   Bacteroides fragilis NOT DETECTED NOT DETECTED Final   Enterobacterales NOT DETECTED NOT DETECTED Final   Enterobacter cloacae complex NOT DETECTED NOT DETECTED Final   Escherichia coli NOT DETECTED NOT DETECTED Final   Klebsiella aerogenes NOT DETECTED NOT DETECTED Final   Klebsiella oxytoca NOT DETECTED NOT  DETECTED Final   Klebsiella pneumoniae NOT DETECTED NOT DETECTED Final   Proteus species NOT DETECTED NOT DETECTED Final   Salmonella species NOT DETECTED NOT DETECTED Final   Serratia marcescens NOT DETECTED NOT DETECTED Final   Haemophilus influenzae NOT DETECTED NOT DETECTED Final   Neisseria meningitidis NOT DETECTED NOT DETECTED Final   Pseudomonas aeruginosa NOT DETECTED NOT DETECTED Final   Stenotrophomonas maltophilia NOT DETECTED NOT DETECTED Final   Candida albicans NOT DETECTED NOT DETECTED Final   Candida auris NOT DETECTED NOT DETECTED Final   Candida glabrata NOT DETECTED NOT DETECTED Final   Candida krusei NOT DETECTED NOT DETECTED Final   Candida parapsilosis NOT DETECTED NOT DETECTED Final   Candida tropicalis NOT DETECTED NOT DETECTED Final   Cryptococcus neoformans/gattii NOT DETECTED NOT DETECTED Final   Methicillin resistance mecA/C DETECTED (A) NOT DETECTED Final    Comment: CRITICAL RESULT CALLED TO, READ BACK BY AND VERIFIED WITH: PHARMD E JACKSON 02/22/2024 @ 2230 BY AB Performed at [facility], [address], [place], [place] [address]   Blood culture (routine x 2)     Status: Abnormal (Preliminary result)   Collection Time: 02/21/24  8:25 PM   Specimen: BLOOD  Result Value Ref Range Status   Specimen Description   Final    BLOOD RIGHT ANTECUBITAL Performed at [facility], [address] [place], [place] [address]    Special Requests   Final    Blood Culture results may not be optimal due to an inadequate volume of blood received in culture bottles BOTTLES DRAWN AEROBIC AND ANAEROBIC Performed at [facility], [address], [place], [place] [address]    Culture  Setup Time   Final    GRAM POSITIVE COCCI IN CLUSTERS ANAEROBIC BOTTLE ONLY CRITICAL VALUE NOTED.  VALUE IS CONSISTENT WITH PREVIOUSLY REPORTED AND CALLED VALUE.    Culture (A)  Final    STAPHYLOCOCCUS EPIDERMIDIS CULTURE REINCUBATED FOR BETTER  GROWTH Performed at [facility], [address], [place], [place] [address]    Report Status PENDING  Incomplete  C Difficile Quick Screen w PCR reflex     Status: None   Collection Time: 02/22/24  9:59 AM   Specimen: STOOL  Result Value Ref Range Status   C Diff antigen NEGATIVE NEGATIVE Final   C Diff toxin NEGATIVE NEGATIVE Final   C Diff interpretation No C. difficile detected.  Final    Comment: Performed at [facility], [address] [place], [place] [address]  Gastrointestinal Panel by PCR , Stool     Status: Abnormal   Collection Time: 02/22/24  9:59 AM   Specimen: STOOL  Result Value Ref Range Status   Campylobacter species NOT DETECTED NOT DETECTED Final   Plesimonas shigelloides NOT DETECTED NOT DETECTED Final   Salmonella species DETECTED (A) NOT DETECTED Final    Comment: RESULT CALLED TO, READ BACK BY AND VERIFIED WITH: JOANNA FELARCA @0005  ON 02/23/24 SKL  Yersinia enterocolitica NOT DETECTED NOT DETECTED Final   Vibrio species NOT DETECTED NOT DETECTED Final   Vibrio cholerae NOT DETECTED NOT DETECTED Final   Enteroaggregative E coli (EAEC) NOT DETECTED NOT DETECTED Final   Enteropathogenic E coli (EPEC) NOT DETECTED NOT DETECTED Final   Enterotoxigenic E coli (ETEC) NOT DETECTED NOT DETECTED Final   Shiga like toxin producing E coli (STEC) NOT DETECTED NOT DETECTED Final   Shigella/Enteroinvasive E coli (EIEC) NOT DETECTED NOT DETECTED Final   Cryptosporidium NOT DETECTED NOT DETECTED Final   Cyclospora cayetanensis NOT DETECTED NOT DETECTED Final   Entamoeba histolytica NOT DETECTED NOT DETECTED Final   Giardia lamblia NOT DETECTED NOT DETECTED Final   Adenovirus F40/41 NOT DETECTED NOT DETECTED Final   Astrovirus NOT DETECTED NOT DETECTED Final   Norovirus GI/GII NOT DETECTED NOT DETECTED Final   Rotavirus A NOT DETECTED NOT DETECTED Final   Sapovirus (I, II, IV, and V) NOT DETECTED NOT DETECTED Final    Comment: Performed at  [facility], [address] [place], [place] [address]  Culture, blood (Routine X 2) w Reflex to ID Panel     Status: None (Preliminary result)   Collection Time: 02/23/24 10:11 AM   Specimen: BLOOD LEFT HAND  Result Value Ref Range Status   Specimen Description   Final    BLOOD LEFT HAND BOTTLES DRAWN AEROBIC ONLY Performed at [facility], [address] [place], [place] [address]    Special Requests   Final    Blood Culture results may not be optimal due to an inadequate volume of blood received in culture bottles Performed at [facility], [address] [place], [place] [address]    Culture   Final    NO GROWTH 2 DAYS Performed at [facility], [address] [place], [place] [address]    Report Status PENDING  Incomplete  Culture, blood (Routine X 2) w Reflex to ID Panel     Status: None (Preliminary result)   Collection Time: 02/23/24 10:17 AM   Specimen: BLOOD LEFT HAND  Result Value Ref Range Status   Specimen Description   Final    BLOOD LEFT HAND BOTTLES DRAWN AEROBIC ONLY Performed at [facility], [address] [place], [place] [address]    Special Requests   Final    Blood Culture results may not be optimal due to an inadequate volume of blood received in culture bottles Performed at [facility], [address] [place], [place] [address]    Culture   Final    NO GROWTH 2 DAYS Performed at [facility], [address], [place], [place] [address]    Report Status PENDING  Incomplete

## 2024-02-25 NOTE — Progress Notes (Signed)
 PROGRESS NOTE    [name]  FMW:[id number] DOB: 06-07-1945 DOA: 02/21/2024 PCP: Gladis Mustard, FNP   Brief Narrative: 78 year old with past medical history significant for chronic low back pain, hypertension, hyperlipidemia presented on 12/22/2023 with acute metabolic encephalopathy thought to be related to severe sepsis secondary to acute cystitis.  Patient presented with altered mental status, patient has been confused over the last 2 days, increased lethargy, somnolence and generalized weakness.  Evaluation in the ED initially systolic blood pressure was in the 90s oxygen saturation 91 room air, tachypnea respiration rate 27, sodium 130 white blood cell 19, UA 21-50 red blood cells and large leukocytes.  CT head no evidence of acute intracranial process.  Chest x-ray negative.   Assessment & Plan:   Principal Problem:   Acute metabolic encephalopathy Active Problems:   HLD (hyperlipidemia)   Chronic low back pain   Severe sepsis (HCC)   Acute cystitis   Leukocytosis   Generalized weakness   History of essential hypertension   1-Acute metabolic encephalopathy in the setting of sepsis, UTI also component of B12 deficiency Component of polypharmacy: Percocet and Zanaflex  CT head no acute findings. Back to baseline.    2-Severe sepsis due to Acute  colitis salmonella infection.  UTI ruled out per ID - Patient presents with leukocytosis, lactic acidosis, source of infection UTI, tachypnea, tachycardia.  UA with 21-50 white blood cell. - White blood cell trending down 19---14--6.6 - Follow urine culture growing klebsiella pneumoniae, pan resistant.   and blood cultures growing gram positive cocci.  -Continue with ceftriaxone   Diarrhea improving.   Blood cultures positive x 2 set growing Staph Epi Repeat blood cultures 10/17: no growth to date ID consulted patient has had back surgery, fusion and rods.  Started on Daptomycin.   3-Diarrhea;  C diff negative GI  pathogen positive for Salmonella.  CT abdomen and pelvis. Finding consistent with colitis.  Continue with IV fluids and antibiotics.    4-Generalized weakness -In setting acute illness.  -Supplement B 12.  -TSH low, check free T 3 1.6  and free T4. 1.0 Needs repeat thyroid  function in 4 weeks.   5-Lactic acidosis -In setting of sepsis.  -Continue with IV fluids, IV antibiotics.   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  She will need thyroid  function test in 4 to 6 weeks  7-B12 deficiency: -Started IM B12 injection, will need oral supplementation after.   Chronic low back pain - Continue with as needed percocet  Essential hypertension: -Hold BP medications in setting of sepsis and diarrhea. Lisinopril  and lasix .   Hypokalemia: Replace orally  Hyperlipidemia: -Continue Lipitor.   Tachycardia/ ? A fib ; will ask cardio to review. Will monitor for now.   Estimated body mass index is 34.27 kg/m as calculated from the following:   Height as of this encounter: 5' (1.524 m).   Weight as of this encounter: 79.6 kg.   DVT prophylaxis: Lovenox  Code Status: Full code Family Communication: son 10/17 Disposition Plan:  Status is: Inpatient Remains inpatient appropriate because: management of sepsis     Consultants:  none  Procedures:  none  Antimicrobials:    Subjective: Rectal tube removed.  No BM this am . Diarrhea improved.   Objective: Vitals:   02/24/24 2120 02/24/24 2145 02/25/24 0500 02/25/24 0544  BP: (!) 117/90   (!) 125/52  Pulse: (!) 124 74  68  Resp: 20   18  Temp: 98.2  F (36.8 C)   98 F (36.7 C)  TempSrc: Oral   Oral  SpO2: 93%   94%  Weight:   79.6 kg   Height:        Intake/Output Summary (Last 24 hours) at 02/25/2024 0734 Last data filed at 02/25/2024 0550 Gross per 24 hour  Intake 1502.52 ml  Output 2750 ml  Net -1247.48 ml   Filed Weights   02/23/24 0500 02/24/24 0500 02/25/24 0500   Weight: 77.9 kg 80 kg 79.6 kg    Examination:  General exam: NAD Respiratory system: CTA Cardiovascular system: S 1, S 2 RRR Gastrointestinal system: BS present, soft, nt Central nervous system: non focal.  Extremities: No edema    Data Reviewed: I have personally reviewed following labs and imaging studies  CBC: Recent Labs  Lab 02/21/24 1654 02/22/24 0512 02/23/24 0847 02/24/24 0826 02/25/24 0531  WBC 19.5* 14.1* 6.6 6.2 6.8  NEUTROABS 17.2* 12.5*  --   --   --   HGB 14.1 13.5 11.4* 11.5* 11.0*  HCT 44.6 41.8 34.8* 35.0* 32.9*  MCV 99.1 100.2* 98.3 97.2 97.1  PLT 278 205 170 208 207   Basic Metabolic Panel: Recent Labs  Lab 02/21/24 1654 02/21/24 2342 02/22/24 0512 02/23/24 0847 02/24/24 0826 02/25/24 0531  NA 138  --  138 138 138 140  K 4.0  --  4.0 3.2* 3.7 3.9  CL 101  --  104 107 107 108  CO2 22  --  23 22 21* 22  GLUCOSE 114*  --  104* 110* 95 90  BUN 16  --  13 10 7* <5*  CREATININE 1.08*  --  0.93 0.84 0.62 0.56  CALCIUM  9.6  --  8.9 8.4* 8.5* 8.4*  MG  --  1.6* 1.9 1.9 1.9  --    GFR: Estimated Creatinine Clearance: 53.2 mL/min (by C-G formula based on SCr of 0.56 mg/dL). Liver Function Tests: Recent Labs  Lab 02/21/24 1654 02/22/24 0512  AST 29 33  ALT 16 14  ALKPHOS 58 46  BILITOT 1.1 0.8  PROT 7.0 5.7*  ALBUMIN  4.3 3.4*   No results for input(s): LIPASE, AMYLASE in the last 168 hours. No results for input(s): AMMONIA in the last 168 hours. Coagulation Profile: Recent Labs  Lab 02/22/24 0512  INR 1.1   Cardiac Enzymes: Recent Labs  Lab 02/21/24 2342 02/23/24 1913  CKTOTAL 123 182   BNP (last 3 results) No results for input(s): PROBNP in the last 8760 hours. HbA1C: No results for input(s): HGBA1C in the last 72 hours. CBG: Recent Labs  Lab 02/23/24 1119  GLUCAP 101*   Lipid Profile: No results for input(s): CHOL, HDL, LDLCALC, TRIG, CHOLHDL, LDLDIRECT in the last 72 hours. Thyroid  Function  Tests: Recent Labs    02/22/24 0901  FREET4 1.05  T3FREE 1.6*   Anemia Panel: No results for input(s): VITAMINB12, FOLATE, FERRITIN, TIBC, IRON, RETICCTPCT in the last 72 hours.  Sepsis Labs: Recent Labs  Lab 02/21/24 1700 02/21/24 1957 02/21/24 2342 02/22/24 0512  LATICACIDVEN 1.9 3.0* 2.9* 1.7    Recent Results (from the past 240 hours)  Resp panel by RT-PCR (RSV, Flu A&B, Covid) Anterior Nasal Swab     Status: None   Collection Time: 02/21/24  7:54 PM   Specimen: Anterior Nasal Swab  Result Value Ref Range Status   SARS Coronavirus 2 by RT PCR NEGATIVE NEGATIVE Final    Comment: (NOTE) SARS-CoV-2 target nucleic acids are NOT DETECTED.  The  SARS-CoV-2 RNA is generally detectable in upper respiratory specimens during the acute phase of infection. The lowest concentration of SARS-CoV-2 viral copies this assay can detect is 138 copies/mL. A negative result does not preclude SARS-Cov-2 infection and should not be used as the sole basis for treatment or other patient management decisions. A negative result may occur with  improper specimen collection/handling, submission of specimen other than nasopharyngeal swab, presence of viral mutation(s) within the areas targeted by this assay, and inadequate number of viral copies(<138 copies/mL). A negative result must be combined with clinical observations, patient history, and epidemiological information. The expected result is Negative.  Fact Sheet for Patients:  BloggerCourse.com  Fact Sheet for Healthcare Providers:  SeriousBroker.it  This test is no t yet approved or cleared by the [place]  FDA and  has been authorized for detection and/or diagnosis of SARS-CoV-2 by FDA under an Emergency Use Authorization (EUA). This EUA will remain  in effect (meaning this test can be used) for the duration of the COVID-19 declaration under Section 564(b)(1) of the Act,  21 U.S.C.section 360bbb-3(b)(1), unless the authorization is terminated  or revoked sooner.       Influenza A by PCR NEGATIVE NEGATIVE Final   Influenza B by PCR NEGATIVE NEGATIVE Final    Comment: (NOTE) The Xpert Xpress SARS-CoV-2/FLU/RSV plus assay is intended as an aid in the diagnosis of influenza from Nasopharyngeal swab specimens and should not be used as a sole basis for treatment. Nasal washings and aspirates are unacceptable for Xpert Xpress SARS-CoV-2/FLU/RSV testing.  Fact Sheet for Patients: BloggerCourse.com  Fact Sheet for Healthcare Providers: SeriousBroker.it  This test is not yet approved or cleared by the [place]  FDA and has been authorized for detection and/or diagnosis of SARS-CoV-2 by FDA under an Emergency Use Authorization (EUA). This EUA will remain in effect (meaning this test can be used) for the duration of the COVID-19 declaration under Section 564(b)(1) of the Act, 21 U.S.C. section 360bbb-3(b)(1), unless the authorization is terminated or revoked.     Resp Syncytial Virus by PCR NEGATIVE NEGATIVE Final    Comment: (NOTE) Fact Sheet for Patients: BloggerCourse.com  Fact Sheet for Healthcare Providers: SeriousBroker.it  This test is not yet approved or cleared by the [place]  FDA and has been authorized for detection and/or diagnosis of SARS-CoV-2 by FDA under an Emergency Use Authorization (EUA). This EUA will remain in effect (meaning this test can be used) for the duration of the COVID-19 declaration under Section 564(b)(1) of the Act, 21 U.S.C. section 360bbb-3(b)(1), unless the authorization is terminated or revoked.  Performed at [facility], [address], [place], [place] [address]   Urine Culture     Status: Abnormal   Collection Time: 02/21/24  8:07 PM   Specimen: Urine, Catheterized  Result Value  Ref Range Status   Specimen Description   Final    URINE, CATHETERIZED Performed at [facility], [address], [place], [place] [address]    Special Requests   Final    NONE Performed at [facility], [address] [place], [place] [address]    Culture (A)  Final    >=100,000 COLONIES/mL KLEBSIELLA PNEUMONIAE Confirmed Extended Spectrum Beta-Lactamase Producer (ESBL).  In bloodstream infections from ESBL organisms, carbapenems are preferred over piperacillin /tazobactam. They are shown to have a lower risk of mortality.    Report Status 02/24/2024 FINAL  Final   Organism ID, Bacteria KLEBSIELLA PNEUMONIAE (A)  Final  Susceptibility   Klebsiella pneumoniae - MIC*    AMPICILLIN  >=32 RESISTANT Resistant     CEFAZOLIN  (URINE) Value in next row Resistant      >=32 RESISTANTThis is a modified FDA-approved test that has been validated and its performance characteristics determined by the reporting laboratory.  This laboratory is certified under the Clinical Laboratory Improvement Amendments CLIA as qualified to perform high complexity clinical laboratory testing.    CEFEPIME Value in next row Resistant      >=32 RESISTANTThis is a modified FDA-approved test that has been validated and its performance characteristics determined by the reporting laboratory.  This laboratory is certified under the Clinical Laboratory Improvement Amendments CLIA as qualified to perform high complexity clinical laboratory testing.    ERTAPENEM Value in next row Sensitive      >=32 RESISTANTThis is a modified FDA-approved test that has been validated and its performance characteristics determined by the reporting laboratory.  This laboratory is certified under the Clinical Laboratory Improvement Amendments CLIA as qualified to perform high complexity clinical laboratory testing.    CEFTRIAXONE  Value in next row Resistant      >=32 RESISTANTThis is a modified FDA-approved test  that has been validated and its performance characteristics determined by the reporting laboratory.  This laboratory is certified under the Clinical Laboratory Improvement Amendments CLIA as qualified to perform high complexity clinical laboratory testing.    CIPROFLOXACIN  Value in next row Resistant      >=32 RESISTANTThis is a modified FDA-approved test that has been validated and its performance characteristics determined by the reporting laboratory.  This laboratory is certified under the Clinical Laboratory Improvement Amendments CLIA as qualified to perform high complexity clinical laboratory testing.    GENTAMICIN  Value in next row Resistant      >=32 RESISTANTThis is a modified FDA-approved test that has been validated and its performance characteristics determined by the reporting laboratory.  This laboratory is certified under the Clinical Laboratory Improvement Amendments CLIA as qualified to perform high complexity clinical laboratory testing.    NITROFURANTOIN  Value in next row Intermediate      >=32 RESISTANTThis is a modified FDA-approved test that has been validated and its performance characteristics determined by the reporting laboratory.  This laboratory is certified under the Clinical Laboratory Improvement Amendments CLIA as qualified to perform high complexity clinical laboratory testing.    TRIMETH/SULFA Value in next row Sensitive      >=32 RESISTANTThis is a modified FDA-approved test that has been validated and its performance characteristics determined by the reporting laboratory.  This laboratory is certified under the Clinical Laboratory Improvement Amendments CLIA as qualified to perform high complexity clinical laboratory testing.    AMPICILLIN /SULBACTAM Value in next row Resistant      >=32 RESISTANTThis is a modified FDA-approved test that has been validated and its performance characteristics determined by the reporting laboratory.  This laboratory is certified under the  Clinical Laboratory Improvement Amendments CLIA as qualified to perform high complexity clinical laboratory testing.    PIP/TAZO Value in next row Intermediate      32 INTERMEDIATEThis is a modified FDA-approved test that has been validated and its performance characteristics determined by the reporting laboratory.  This laboratory is certified under the Clinical Laboratory Improvement Amendments CLIA as qualified to perform high complexity clinical laboratory testing.    MEROPENEM Value in next row Sensitive      32 INTERMEDIATEThis is a modified FDA-approved test that has been validated and its performance characteristics determined by the  reporting laboratory.  This laboratory is certified under the Clinical Laboratory Improvement Amendments CLIA as qualified to perform high complexity clinical laboratory testing.    * >=100,000 COLONIES/mL KLEBSIELLA PNEUMONIAE  Blood culture (routine x 2)     Status: Abnormal (Preliminary result)   Collection Time: 02/21/24  8:20 PM   Specimen: BLOOD RIGHT HAND  Result Value Ref Range Status   Specimen Description   Final    BLOOD RIGHT HAND Performed at [facility], [address], [place], [place] [address]    Special Requests   Final    Blood Culture results may not be optimal due to an inadequate volume of blood received in culture bottles BOTTLES DRAWN AEROBIC AND ANAEROBIC Performed at [facility], [address], [place], [place] [address]    Culture  Setup Time   Final    GRAM POSITIVE COCCI IN CLUSTERS ANAEROBIC BOTTLE ONLY CRITICAL RESULT CALLED TO, READ BACK BY AND VERIFIED WITH: PHARMD E JACKSON 02/22/2024 @ 2230 BY AB    Culture (A)  Final    STAPHYLOCOCCUS EPIDERMIDIS SUSCEPTIBILITIES TO FOLLOW Performed at [facility], [address], [place], [place] [address]    Report Status PENDING  Incomplete  Blood Culture ID Panel (Reflexed)     Status: Abnormal   Collection Time: 02/21/24  8:20 PM  Result  Value Ref Range Status   Enterococcus faecalis NOT DETECTED NOT DETECTED Final   Enterococcus Faecium NOT DETECTED NOT DETECTED Final   Listeria monocytogenes NOT DETECTED NOT DETECTED Final   Staphylococcus species DETECTED (A) NOT DETECTED Final    Comment: CRITICAL RESULT CALLED TO, READ BACK BY AND VERIFIED WITH: PHARMD E JACKSON 02/22/2024 @ 2230 BY AB    Staphylococcus aureus (BCID) NOT DETECTED NOT DETECTED Final   Staphylococcus epidermidis DETECTED (A) NOT DETECTED Final    Comment: Methicillin (oxacillin) resistant coagulase negative staphylococcus. Possible blood culture contaminant (unless isolated from more than one blood culture draw or clinical case suggests pathogenicity). No antibiotic treatment is indicated for blood  culture contaminants. CRITICAL RESULT CALLED TO, READ BACK BY AND VERIFIED WITH: PHARMD E JACKSON 02/22/2024 @ 2230 BY AB    Staphylococcus lugdunensis NOT DETECTED NOT DETECTED Final   Streptococcus species NOT DETECTED NOT DETECTED Final   Streptococcus agalactiae NOT DETECTED NOT DETECTED Final   Streptococcus pneumoniae NOT DETECTED NOT DETECTED Final   Streptococcus pyogenes NOT DETECTED NOT DETECTED Final   A.calcoaceticus-baumannii NOT DETECTED NOT DETECTED Final   Bacteroides fragilis NOT DETECTED NOT DETECTED Final   Enterobacterales NOT DETECTED NOT DETECTED Final   Enterobacter cloacae complex NOT DETECTED NOT DETECTED Final   Escherichia coli NOT DETECTED NOT DETECTED Final   Klebsiella aerogenes NOT DETECTED NOT DETECTED Final   Klebsiella oxytoca NOT DETECTED NOT DETECTED Final   Klebsiella pneumoniae NOT DETECTED NOT DETECTED Final   Proteus species NOT DETECTED NOT DETECTED Final   Salmonella species NOT DETECTED NOT DETECTED Final   Serratia marcescens NOT DETECTED NOT DETECTED Final   Haemophilus influenzae NOT DETECTED NOT DETECTED Final   Neisseria meningitidis NOT DETECTED NOT DETECTED Final   Pseudomonas aeruginosa NOT DETECTED  NOT DETECTED Final   Stenotrophomonas maltophilia NOT DETECTED NOT DETECTED Final   Candida albicans NOT DETECTED NOT DETECTED Final   Candida auris NOT DETECTED NOT DETECTED Final   Candida glabrata NOT DETECTED NOT DETECTED Final   Candida krusei NOT DETECTED NOT DETECTED Final   Candida parapsilosis NOT DETECTED NOT DETECTED Final  Candida tropicalis NOT DETECTED NOT DETECTED Final   Cryptococcus neoformans/gattii NOT DETECTED NOT DETECTED Final   Methicillin resistance mecA/C DETECTED (A) NOT DETECTED Final    Comment: CRITICAL RESULT CALLED TO, READ BACK BY AND VERIFIED WITH: PHARMD E JACKSON 02/22/2024 @ 2230 BY AB Performed at [facility], [address] [place], [place] [address]   Blood culture (routine x 2)     Status: Abnormal (Preliminary result)   Collection Time: 02/21/24  8:25 PM   Specimen: BLOOD  Result Value Ref Range Status   Specimen Description   Final    BLOOD RIGHT ANTECUBITAL Performed at [facility], [address], [place], [place] [address]    Special Requests   Final    Blood Culture results may not be optimal due to an inadequate volume of blood received in culture bottles BOTTLES DRAWN AEROBIC AND ANAEROBIC Performed at [facility], [address], [place], [place] [address]    Culture  Setup Time   Final    GRAM POSITIVE COCCI IN CLUSTERS ANAEROBIC BOTTLE ONLY CRITICAL VALUE NOTED.  VALUE IS CONSISTENT WITH PREVIOUSLY REPORTED AND CALLED VALUE. Performed at [facility], [address] [place], [place] [address]    Culture STAPHYLOCOCCUS EPIDERMIDIS (A)  Final   Report Status PENDING  Incomplete  C Difficile Quick Screen w PCR reflex     Status: None   Collection Time: 02/22/24  9:59 AM   Specimen: STOOL  Result Value Ref Range Status   C Diff antigen NEGATIVE NEGATIVE Final   C Diff toxin NEGATIVE NEGATIVE Final   C Diff interpretation No C. difficile detected.  Final    Comment: Performed  at [facility], [address] [place], [place] [address]  Gastrointestinal Panel by PCR , Stool     Status: Abnormal   Collection Time: 02/22/24  9:59 AM   Specimen: STOOL  Result Value Ref Range Status   Campylobacter species NOT DETECTED NOT DETECTED Final   Plesimonas shigelloides NOT DETECTED NOT DETECTED Final   Salmonella species DETECTED (A) NOT DETECTED Final    Comment: RESULT CALLED TO, READ BACK BY AND VERIFIED WITH: JOANNA FELARCA @0005  ON 02/23/24 SKL    Yersinia enterocolitica NOT DETECTED NOT DETECTED Final   Vibrio species NOT DETECTED NOT DETECTED Final   Vibrio cholerae NOT DETECTED NOT DETECTED Final   Enteroaggregative E coli (EAEC) NOT DETECTED NOT DETECTED Final   Enteropathogenic E coli (EPEC) NOT DETECTED NOT DETECTED Final   Enterotoxigenic E coli (ETEC) NOT DETECTED NOT DETECTED Final   Shiga like toxin producing E coli (STEC) NOT DETECTED NOT DETECTED Final   Shigella/Enteroinvasive E coli (EIEC) NOT DETECTED NOT DETECTED Final   Cryptosporidium NOT DETECTED NOT DETECTED Final   Cyclospora cayetanensis NOT DETECTED NOT DETECTED Final   Entamoeba histolytica NOT DETECTED NOT DETECTED Final   Giardia lamblia NOT DETECTED NOT DETECTED Final   Adenovirus F40/41 NOT DETECTED NOT DETECTED Final   Astrovirus NOT DETECTED NOT DETECTED Final   Norovirus GI/GII NOT DETECTED NOT DETECTED Final   Rotavirus A NOT DETECTED NOT DETECTED Final   Sapovirus (I, II, IV, and V) NOT DETECTED NOT DETECTED Final    Comment: Performed at [facility], [address] [place], [place] [address]  Culture, blood (Routine X 2) w Reflex to ID Panel     Status: None (Preliminary result)   Collection Time: 02/23/24 10:11 AM   Specimen: BLOOD LEFT  HAND  Result Value Ref Range Status   Specimen Description   Final    BLOOD LEFT HAND BOTTLES DRAWN AEROBIC ONLY Performed at [facility], [address] [place], [place] [address]     Special Requests   Final    Blood Culture results may not be optimal due to an inadequate volume of blood received in culture bottles Performed at [facility] [address], [place], [place] [address]    Culture   Final    NO GROWTH < 24 HOURS Performed at [facility], [address] [place], [place] [address]    Report Status PENDING  Incomplete  Culture, blood (Routine X 2) w Reflex to ID Panel     Status: None (Preliminary result)   Collection Time: 02/23/24 10:17 AM   Specimen: BLOOD LEFT HAND  Result Value Ref Range Status   Specimen Description   Final    BLOOD LEFT HAND BOTTLES DRAWN AEROBIC ONLY Performed at [facility], [address], [place], [place] [address]    Special Requests   Final    Blood Culture results may not be optimal due to an inadequate volume of blood received in culture bottles Performed at [facility], [address], [place], [place] [address]    Culture   Final    NO GROWTH < 24 HOURS Performed at [facility], [address], [place], [place] [address]    Report Status PENDING  Incomplete         Radiology Studies: No results found.       Scheduled Meds:  atorvastatin   40 mg Oral Daily   cyanocobalamin   1,000 mcg Intramuscular Daily   enoxaparin  (LOVENOX ) injection  40 mg Subcutaneous Q24H   lidocaine   1 patch Transdermal Q24H   Continuous Infusions:  cefTRIAXone  (ROCEPHIN )  IV Stopped (02/24/24 1650)   DAPTOmycin Stopped (02/24/24 1354)   lactated ringers  100 mL/hr at 02/25/24 0325     LOS: 4 days    Time spent: 35 minutes    Rashee Marschall A Latroya Ng, MD [facility]   If 7PM-7AM, please contact night-coverage [email or web address]  02/25/2024, 7:34 AM

## 2024-02-25 NOTE — Progress Notes (Signed)
 Mobility Specialist - Progress Note   02/25/24 0940  Mobility  Activity Ambulated with assistance  Level of Assistance Contact guard assist, steadying assist  Assistive Device Front wheel walker  Distance Ambulated (ft) 100 ft  Range of Motion/Exercises Active  Activity Response Tolerated well  Mobility Referral Yes  Mobility visit 1 Mobility  Mobility Specialist Start Time (ACUTE ONLY) 0940  Mobility Specialist Stop Time (ACUTE ONLY) 0955  Mobility Specialist Time Calculation (min) (ACUTE ONLY) 15 min   Received in bed and agreed to mobility, had c/o slight dizziness and weakness throughout session, dizziness faded early on. Returned to chair with all needs met. Requested an afternoon session, will return if possible.  Cyndee Ada Mobility Specialist

## 2024-02-26 ENCOUNTER — Other Ambulatory Visit (HOSPITAL_COMMUNITY): Payer: Self-pay

## 2024-02-26 DIAGNOSIS — B961 Klebsiella pneumoniae [K. pneumoniae] as the cause of diseases classified elsewhere: Secondary | ICD-10-CM

## 2024-02-26 DIAGNOSIS — G9341 Metabolic encephalopathy: Secondary | ICD-10-CM | POA: Diagnosis not present

## 2024-02-26 DIAGNOSIS — A020 Salmonella enteritis: Secondary | ICD-10-CM | POA: Diagnosis not present

## 2024-02-26 DIAGNOSIS — Z1624 Resistance to multiple antibiotics: Secondary | ICD-10-CM

## 2024-02-26 DIAGNOSIS — R7881 Bacteremia: Secondary | ICD-10-CM | POA: Diagnosis not present

## 2024-02-26 DIAGNOSIS — B958 Unspecified staphylococcus as the cause of diseases classified elsewhere: Secondary | ICD-10-CM

## 2024-02-26 MED ORDER — CYANOCOBALAMIN 500 MCG PO TABS
500.0000 ug | ORAL_TABLET | Freq: Every day | ORAL | 0 refills | Status: AC
  Filled 2024-02-26: qty 30, 30d supply, fill #0

## 2024-02-26 MED ORDER — CYANOCOBALAMIN 500 MCG PO TABS: 500.0000 ug | ORAL_TABLET | Freq: Every day | ORAL | Status: DC

## 2024-02-26 MED ORDER — LEVOFLOXACIN 750 MG PO TABS
750.0000 mg | ORAL_TABLET | Freq: Every day | ORAL | 0 refills | Status: AC
  Filled 2024-02-26: qty 1, 1d supply, fill #0

## 2024-02-26 NOTE — Discharge Summary (Signed)
 Physician Discharge Summary   Patient: [name] MRN: [id number] DOB: 03-19-1946  Admit date:     02/21/2024  Discharge date: 02/26/24  Discharge Physician: Owen DELENA Lore   PCP: Gladis Mustard, FNP   Recommendations at discharge:    Follow up with ID as needed Needs follow up labs.  Needs TSH 4 weeks.   Discharge Diagnoses: Principal Problem:   Acute metabolic encephalopathy Active Problems:   HLD (hyperlipidemia)   Chronic low back pain   Severe sepsis (HCC)   Acute cystitis   Leukocytosis   Generalized weakness   History of essential hypertension  Resolved Problems:   * No resolved hospital problems. [place] Course: 78 year old with past medical history significant for chronic low back pain, hypertension, hyperlipidemia presented on 12/22/2023 with acute metabolic encephalopathy thought to be related to severe sepsis secondary to acute cystitis.  Patient presented with altered mental status, patient has been confused over the last 2 days, increased lethargy, somnolence and generalized weakness.  Evaluation in the ED initially systolic blood pressure was in the 90s oxygen saturation 91 room air, tachypnea respiration rate 27, sodium 130 white blood cell 19, UA 21-50 red blood cells and large leukocytes.  CT head no evidence of acute intracranial process.  Chest x-ray negative.     Assessment and Plan:  1-Acute metabolic encephalopathy in the setting of sepsis, UTI also component of B12 deficiency Component of polypharmacy: Percocet and Zanaflex  CT head no acute findings. Back to baseline.      2-Severe sepsis due to Acute  colitis salmonella infection.  UTI ruled out per ID - Patient presents with leukocytosis, lactic acidosis, source of infection UTI, tachypnea, tachycardia.  UA with 21-50 white blood cell. - White blood cell trending down 19---14--6.6 - Follow urine culture growing klebsiella pneumoniae, pan resistant.   and blood cultures  growing gram positive cocci.  -Treated  with ceftriaxone . Discharge on one more day Levaquin.  Diarrhea resolved.    Blood cultures positive x 2 set growing Staph Epi Repeat blood cultures 10/17: no growth to date ID consulted patient has had back surgery, fusion and rods.  Started on Daptomycin.  Blood culture obtain before she received antibiotics.  ID think is was contaminate.   3-Diarrhea;  C diff negative GI pathogen positive for Salmonella.  CT abdomen and pelvis. Finding consistent with colitis.  Continue with IV fluids and antibiotics.      4-Generalized weakness -In setting acute illness.  -Supplement B 12.  -TSH low, check free T 3 1.6  and free T4. 1.0 Needs repeat thyroid  function in 4 weeks.    5-Lactic acidosis -In setting of sepsis.  -Continue with IV fluids, IV antibiotics.    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  She will need thyroid  function test in 4 to 6 weeks   7-B12 deficiency: -Started IM B12 injection, will need oral supplementation after.    Chronic low back pain - Continue with as needed percocet   Essential hypertension: -resume meds at discharge, Lisinopril  and lasix .    Hypokalemia: Replaced   Hyperlipidemia: -Continue Lipitor.    Tachycardia; cardiology review telemetry no irregular rhythm notice.        Consultants: ID Procedures performed: none Disposition: Home Diet recommendation:  Discharge Diet Orders (From admission, onward)     Start     Ordered   02/26/24 0000  Diet - low sodium heart healthy  02/26/24 1219           Cardiac diet DISCHARGE MEDICATION: Allergies as of 02/26/2024       Reactions   Cashew Nut Oil Hives   Dog Epithelium Hives, Itching   Sulfa Antibiotics Other (See Comments)   blistering lips and mouth sores        Medication List     STOP taking these medications    meloxicam  7.5 MG tablet Commonly known as: MOBIC    methocarbamol   500 MG tablet Commonly known as: ROBAXIN    metoprolol  tartrate 25 MG tablet Commonly known as: LOPRESSOR        TAKE these medications    albuterol  108 (90 Base) MCG/ACT inhaler Commonly known as: VENTOLIN  HFA Inhale 2 puffs into the lungs every 6 (six) hours as needed for wheezing or shortness of breath.   atorvastatin  40 MG tablet Commonly known as: LIPITOR Take 1 tablet (40 mg total) by mouth daily.   cyanocobalamin  500 MCG tablet Commonly known as: VITAMIN B12 Take 1 tablet (500 mcg total) by mouth daily.   furosemide  40 MG tablet Commonly known as: LASIX  Take 40 mg by mouth daily.   levofloxacin 750 MG tablet Commonly known as: Levaquin Take 1 tablet (750 mg total) by mouth daily for 1 day.   lisinopril  20 MG tablet Commonly known as: ZESTRIL  Take 1 tablet by mouth once daily   mupirocin  ointment 2 % Commonly known as: BACTROBAN  Apply 1 Application topically as needed (for iritation).   nystatin  cream Commonly known as: MYCOSTATIN  APPLY TOPICALLY AS NEEDED FOR DRY SKIN   nystatin  powder Commonly known as: MYCOSTATIN /NYSTOP  Apply 1 Application topically 3 (three) times daily.   oxyCODONE -acetaminophen  7.5-325 MG tablet Commonly known as: PERCOCET Take 1 tablet by mouth every 8 (eight) hours as needed for severe pain (pain score 7-10).   pantoprazole  40 MG tablet Commonly known as: PROTONIX  TAKE 1 TABLET BY MOUTH ONCE DAILY BEFORE SUPPER FOR STOMACH PAIN   Premarin  vaginal cream Generic drug: conjugated estrogens  Apply 0.5 mg intravaginally once daily x 2 weeks and then twice a week   tizanidine  2 MG capsule Commonly known as: ZANAFLEX  TAKE 1 CAPSULE BY MOUTH THREE TIMES DAILY               Discharge Care Instructions  (From admission, onward)           Start     Ordered   02/26/24 0000  No dressing needed        02/26/24 1219            Discharge Exam: Filed Weights   02/24/24 0500 02/25/24 0500 02/26/24 0500  Weight: 80 kg  79.6 kg 79.3 kg   General; NAD  Condition at discharge: stable  The results of significant diagnostics from this hospitalization (including imaging, microbiology, ancillary and laboratory) are listed below for reference.   Imaging Studies: CT ABDOMEN PELVIS W CONTRAST Result Date: 02/22/2024 CLINICAL DATA:  Abdominal pain, acute. EXAM: CT ABDOMEN AND PELVIS WITH CONTRAST TECHNIQUE: Multidetector CT imaging of the abdomen and pelvis was performed using the standard protocol following bolus administration of intravenous contrast. RADIATION DOSE REDUCTION: This exam was performed according to the departmental dose-optimization program which includes automated exposure control, adjustment of the mA and/or kV according to patient size and/or use of iterative reconstruction technique. CONTRAST:  OMNIPAQUE  IOHEXOL  300 MG/ML  SOLN COMPARISON:  12/29/2021 FINDINGS: Lower chest: Dependent changes at the lung bases. No pleural effusions. Small amount of pericardial  fluid. Hepatobiliary: Normal appearance of the liver. Gallbladder is decompressed. No biliary dilatation. Main portal venous system is patent. Pancreas: Unremarkable. No pancreatic ductal dilatation or surrounding inflammatory changes. Spleen: Normal in size without focal abnormality. Adrenals/Urinary Tract: Normal adrenal glands. Normal appearance of the right kidney without hydronephrosis. Atrophic left kidney with chronic dilatation the left renal calices and left renal pelvis. Layering stone in the left kidney lower pole measuring up to 1.0 cm. Left ureter is not dilated. Normal appearance of the urinary bladder. Stomach/Bowel: Postoperative changes suggestive for a right hemicolectomy. There is wall thickening and pericolonic stranding involving the transverse colon and descending colon. Colonic wall thickening starts at the small bowel to colonic anastomosis. There is a relatively normal appearance of the sigmoid colon and rectum. Normal  appearance of the stomach. No small bowel dilatation. Vascular/Lymphatic: Diffuse atherosclerotic calcifications in the abdominal aorta without aneurysm. The abdominal aorta is tortuous. No significant lymph node enlargement in the abdomen or pelvis. Reproductive: Status post hysterectomy. No adnexal masses. Other: No significant free fluid. Negative for free air. Laxity along the anterior abdominal wall. Heterogeneous soft tissue with small calcifications along the right anterior abdominal cavity. This area measures up to 3.5 cm and previously measured 4.1 cm. Findings probably represent an area of fat necrosis. Musculoskeletal: Bilateral hip replacements are located. Again noted is surgical fusion from L2 through L5. Severe disc space loss at L1-L2 and T12-L1. IMPRESSION: 1. Wall thickening and pericolonic stranding involving the transverse colon and descending colon. Findings are compatible with colitis. 2. Postoperative changes suggestive for a right hemicolectomy. 3. Atrophic left kidney with chronic dilatation of the left renal calices and left renal pelvis. 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 Left kidney stone. 4. Aortic Atherosclerosis (ICD10-I70.0). Electronically Signed   By: Juliene Balder M.D.   On: 02/22/2024 17:32   CT Head Wo Contrast Result Date: 02/21/2024 CLINICAL DATA:  Altered mental status. EXAM: CT HEAD WITHOUT CONTRAST TECHNIQUE: Contiguous axial images were obtained from the base of the skull through the vertex without intravenous contrast. RADIATION DOSE REDUCTION: This exam was performed according to the departmental dose-optimization program which includes automated exposure control, adjustment of the mA and/or kV according to patient size and/or use of iterative reconstruction technique. COMPARISON:  None Available. FINDINGS: Brain: There is generalized cerebral atrophy with widening of the extra-axial spaces and ventricular dilatation. There are areas of decreased  attenuation within the white matter tracts of the supratentorial brain, consistent with microvascular disease changes. Vascular: No hyperdense vessel or unexpected calcification. Skull: Normal. Negative for fracture or focal lesion. Sinuses/Orbits: There is mild posterior left maxillary sinus mucosal thickening. Other: None. IMPRESSION: 1. Generalized cerebral atrophy and microvascular disease changes of the supratentorial brain. 2. No acute intracranial abnormality. 3. Mild left maxillary sinus disease. Electronically Signed   By: Suzen Dials M.D.   On: 02/21/2024 19:11   DG Chest 2 View Result Date: 02/21/2024 CLINICAL DATA:  Weakness EXAM: CHEST - 2 VIEW COMPARISON:  09/07/2023 FINDINGS: The heart size and mediastinal contours are within normal limits. Both lungs are clear. The visualized skeletal structures are unremarkable. IMPRESSION: No active cardiopulmonary disease. Electronically Signed   By: Franky Crease M.D.   On: 02/21/2024 17:34    Microbiology: Results for orders placed or performed during the hospital encounter of 02/21/24  Resp panel by RT-PCR (RSV, Flu A&B, Covid) Anterior Nasal Swab     Status: None   Collection Time: 02/21/24  7:54 PM   Specimen: Anterior Nasal  Swab  Result Value Ref Range Status   SARS Coronavirus 2 by RT PCR NEGATIVE NEGATIVE Final    Comment: (NOTE) SARS-CoV-2 target nucleic acids are NOT DETECTED.  The SARS-CoV-2 RNA is generally detectable in upper respiratory specimens during the acute phase of infection. The lowest concentration of SARS-CoV-2 viral copies this assay can detect is 138 copies/mL. A negative result does not preclude SARS-Cov-2 infection and should not be used as the sole basis for treatment or other patient management decisions. A negative result may occur with  improper specimen collection/handling, submission of specimen other than nasopharyngeal swab, presence of viral mutation(s) within the areas targeted by this assay, and  inadequate number of viral copies(<138 copies/mL). A negative result must be combined with clinical observations, patient history, and epidemiological information. The expected result is Negative.  Fact Sheet for Patients:  BloggerCourse.com  Fact Sheet for Healthcare Providers:  SeriousBroker.it  This test is no t yet approved or cleared by the [place]  FDA and  has been authorized for detection and/or diagnosis of SARS-CoV-2 by FDA under an Emergency Use Authorization (EUA). This EUA will remain  in effect (meaning this test can be used) for the duration of the COVID-19 declaration under Section 564(b)(1) of the Act, 21 U.S.C.section 360bbb-3(b)(1), unless the authorization is terminated  or revoked sooner.       Influenza A by PCR NEGATIVE NEGATIVE Final   Influenza B by PCR NEGATIVE NEGATIVE Final    Comment: (NOTE) The Xpert Xpress SARS-CoV-2/FLU/RSV plus assay is intended as an aid in the diagnosis of influenza from Nasopharyngeal swab specimens and should not be used as a sole basis for treatment. Nasal washings and aspirates are unacceptable for Xpert Xpress SARS-CoV-2/FLU/RSV testing.  Fact Sheet for Patients: BloggerCourse.com  Fact Sheet for Healthcare Providers: SeriousBroker.it  This test is not yet approved or cleared by the [place]  FDA and has been authorized for detection and/or diagnosis of SARS-CoV-2 by FDA under an Emergency Use Authorization (EUA). This EUA will remain in effect (meaning this test can be used) for the duration of the COVID-19 declaration under Section 564(b)(1) of the Act, 21 U.S.C. section 360bbb-3(b)(1), unless the authorization is terminated or revoked.     Resp Syncytial Virus by PCR NEGATIVE NEGATIVE Final    Comment: (NOTE) Fact Sheet for Patients: BloggerCourse.com  Fact Sheet for Healthcare  Providers: SeriousBroker.it  This test is not yet approved or cleared by the [place]  FDA and has been authorized for detection and/or diagnosis of SARS-CoV-2 by FDA under an Emergency Use Authorization (EUA). This EUA will remain in effect (meaning this test can be used) for the duration of the COVID-19 declaration under Section 564(b)(1) of the Act, 21 U.S.C. section 360bbb-3(b)(1), unless the authorization is terminated or revoked.  Performed at [facility], [address], [place], [place] [address]   Urine Culture     Status: Abnormal   Collection Time: 02/21/24  8:07 PM   Specimen: Urine, Catheterized  Result Value Ref Range Status   Specimen Description   Final    URINE, CATHETERIZED Performed at [facility], [address] [place], [place] [address]    Special Requests   Final    NONE Performed at [facility], [address], [place], [place] [address]    Culture (A)  Final    >=100,000 COLONIES/mL KLEBSIELLA PNEUMONIAE Confirmed Extended Spectrum Beta-Lactamase Producer (ESBL).  In bloodstream infections from ESBL organisms, carbapenems are preferred over piperacillin /tazobactam.  They are shown to have a lower risk of mortality.    Report Status 02/24/2024 FINAL  Final   Organism ID, Bacteria KLEBSIELLA PNEUMONIAE (A)  Final      Susceptibility   Klebsiella pneumoniae - MIC*    AMPICILLIN  >=32 RESISTANT Resistant     CEFAZOLIN  (URINE) Value in next row Resistant      >=32 RESISTANTThis is a modified FDA-approved test that has been validated and its performance characteristics determined by the reporting laboratory.  This laboratory is certified under the Clinical Laboratory Improvement Amendments CLIA as qualified to perform high complexity clinical laboratory testing.    CEFEPIME Value in next row Resistant      >=32 RESISTANTThis is a modified FDA-approved test that has been  validated and its performance characteristics determined by the reporting laboratory.  This laboratory is certified under the Clinical Laboratory Improvement Amendments CLIA as qualified to perform high complexity clinical laboratory testing.    ERTAPENEM Value in next row Sensitive      >=32 RESISTANTThis is a modified FDA-approved test that has been validated and its performance characteristics determined by the reporting laboratory.  This laboratory is certified under the Clinical Laboratory Improvement Amendments CLIA as qualified to perform high complexity clinical laboratory testing.    CEFTRIAXONE  Value in next row Resistant      >=32 RESISTANTThis is a modified FDA-approved test that has been validated and its performance characteristics determined by the reporting laboratory.  This laboratory is certified under the Clinical Laboratory Improvement Amendments CLIA as qualified to perform high complexity clinical laboratory testing.    CIPROFLOXACIN  Value in next row Resistant      >=32 RESISTANTThis is a modified FDA-approved test that has been validated and its performance characteristics determined by the reporting laboratory.  This laboratory is certified under the Clinical Laboratory Improvement Amendments CLIA as qualified to perform high complexity clinical laboratory testing.    GENTAMICIN  Value in next row Resistant      >=32 RESISTANTThis is a modified FDA-approved test that has been validated and its performance characteristics determined by the reporting laboratory.  This laboratory is certified under the Clinical Laboratory Improvement Amendments CLIA as qualified to perform high complexity clinical laboratory testing.    NITROFURANTOIN  Value in next row Intermediate      >=32 RESISTANTThis is a modified FDA-approved test that has been validated and its performance characteristics determined by the reporting laboratory.  This laboratory is certified under the Clinical Laboratory  Improvement Amendments CLIA as qualified to perform high complexity clinical laboratory testing.    TRIMETH/SULFA Value in next row Sensitive      >=32 RESISTANTThis is a modified FDA-approved test that has been validated and its performance characteristics determined by the reporting laboratory.  This laboratory is certified under the Clinical Laboratory Improvement Amendments CLIA as qualified to perform high complexity clinical laboratory testing.    AMPICILLIN /SULBACTAM Value in next row Resistant      >=32 RESISTANTThis is a modified FDA-approved test that has been validated and its performance characteristics determined by the reporting laboratory.  This laboratory is certified under the Clinical Laboratory Improvement Amendments CLIA as qualified to perform high complexity clinical laboratory testing.    PIP/TAZO Value in next row Intermediate      32 INTERMEDIATEThis is a modified FDA-approved test that has been validated and its performance characteristics determined by the reporting laboratory.  This laboratory is certified under the Clinical Laboratory Improvement Amendments CLIA as qualified to perform high complexity clinical  laboratory testing.    MEROPENEM Value in next row Sensitive      32 INTERMEDIATEThis is a modified FDA-approved test that has been validated and its performance characteristics determined by the reporting laboratory.  This laboratory is certified under the Clinical Laboratory Improvement Amendments CLIA as qualified to perform high complexity clinical laboratory testing.    * >=100,000 COLONIES/mL KLEBSIELLA PNEUMONIAE  Blood culture (routine x 2)     Status: Abnormal   Collection Time: 02/21/24  8:20 PM   Specimen: BLOOD RIGHT HAND  Result Value Ref Range Status   Specimen Description   Final    BLOOD RIGHT HAND Performed at [facility], [address], [place], [place] [address]    Special Requests   Final    Blood Culture results may not be optimal due  to an inadequate volume of blood received in culture bottles BOTTLES DRAWN AEROBIC AND ANAEROBIC Performed at [facility], [address] [place], [place] [address]    Culture  Setup Time   Final    GRAM POSITIVE COCCI IN CLUSTERS ANAEROBIC BOTTLE ONLY CRITICAL RESULT CALLED TO, READ BACK BY AND VERIFIED WITH: PHARMD E JACKSON 02/22/2024 @ 2230 BY AB Performed at [facility], [address] [place], [place] [address]    Culture STAPHYLOCOCCUS EPIDERMIDIS (A)  Final   Report Status 02/25/2024 FINAL  Final   Organism ID, Bacteria STAPHYLOCOCCUS EPIDERMIDIS  Final      Susceptibility   Staphylococcus epidermidis - MIC*    CIPROFLOXACIN  <=0.5 SENSITIVE Sensitive     ERYTHROMYCIN >=8 RESISTANT Resistant     GENTAMICIN  <=0.5 SENSITIVE Sensitive     OXACILLIN >=4 RESISTANT Resistant     TETRACYCLINE <=1 SENSITIVE Sensitive     VANCOMYCIN  1 SENSITIVE Sensitive     TRIMETH/SULFA <=10 SENSITIVE Sensitive     CLINDAMYCIN  >=8 RESISTANT Resistant     RIFAMPIN <=0.5 SENSITIVE Sensitive     Inducible Clindamycin  NEGATIVE Sensitive     * STAPHYLOCOCCUS EPIDERMIDIS  Blood Culture ID Panel (Reflexed)     Status: Abnormal   Collection Time: 02/21/24  8:20 PM  Result Value Ref Range Status   Enterococcus faecalis NOT DETECTED NOT DETECTED Final   Enterococcus Faecium NOT DETECTED NOT DETECTED Final   Listeria monocytogenes NOT DETECTED NOT DETECTED Final   Staphylococcus species DETECTED (A) NOT DETECTED Final    Comment: CRITICAL RESULT CALLED TO, READ BACK BY AND VERIFIED WITH: PHARMD E JACKSON 02/22/2024 @ 2230 BY AB    Staphylococcus aureus (BCID) NOT DETECTED NOT DETECTED Final   Staphylococcus epidermidis DETECTED (A) NOT DETECTED Final    Comment: Methicillin (oxacillin) resistant coagulase negative staphylococcus. Possible blood culture contaminant (unless isolated from more than one blood culture draw or clinical case suggests pathogenicity). No antibiotic  treatment is indicated for blood  culture contaminants. CRITICAL RESULT CALLED TO, READ BACK BY AND VERIFIED WITH: PHARMD E JACKSON 02/22/2024 @ 2230 BY AB    Staphylococcus lugdunensis NOT DETECTED NOT DETECTED Final   Streptococcus species NOT DETECTED NOT DETECTED Final   Streptococcus agalactiae NOT DETECTED NOT DETECTED Final   Streptococcus pneumoniae NOT DETECTED NOT DETECTED Final   Streptococcus pyogenes NOT DETECTED NOT DETECTED Final   A.calcoaceticus-baumannii NOT DETECTED NOT DETECTED Final   Bacteroides fragilis NOT DETECTED NOT DETECTED Final   Enterobacterales NOT DETECTED NOT DETECTED Final   Enterobacter cloacae complex NOT DETECTED NOT DETECTED Final   Escherichia coli NOT DETECTED NOT DETECTED Final  Klebsiella aerogenes NOT DETECTED NOT DETECTED Final   Klebsiella oxytoca NOT DETECTED NOT DETECTED Final   Klebsiella pneumoniae NOT DETECTED NOT DETECTED Final   Proteus species NOT DETECTED NOT DETECTED Final   Salmonella species NOT DETECTED NOT DETECTED Final   Serratia marcescens NOT DETECTED NOT DETECTED Final   Haemophilus influenzae NOT DETECTED NOT DETECTED Final   Neisseria meningitidis NOT DETECTED NOT DETECTED Final   Pseudomonas aeruginosa NOT DETECTED NOT DETECTED Final   Stenotrophomonas maltophilia NOT DETECTED NOT DETECTED Final   Candida albicans NOT DETECTED NOT DETECTED Final   Candida auris NOT DETECTED NOT DETECTED Final   Candida glabrata NOT DETECTED NOT DETECTED Final   Candida krusei NOT DETECTED NOT DETECTED Final   Candida parapsilosis NOT DETECTED NOT DETECTED Final   Candida tropicalis NOT DETECTED NOT DETECTED Final   Cryptococcus neoformans/gattii NOT DETECTED NOT DETECTED Final   Methicillin resistance mecA/C DETECTED (A) NOT DETECTED Final    Comment: CRITICAL RESULT CALLED TO, READ BACK BY AND VERIFIED WITH: PHARMD E JACKSON 02/22/2024 @ 2230 BY AB Performed at [facility], [address] [place], [place] [address]    Blood culture (routine x 2)     Status: Abnormal (Preliminary result)   Collection Time: 02/21/24  8:25 PM   Specimen: BLOOD  Result Value Ref Range Status   Specimen Description   Final    BLOOD RIGHT ANTECUBITAL Performed at [facility], [address] [place], [place] [address]    Special Requests   Final    Blood Culture results may not be optimal due to an inadequate volume of blood received in culture bottles BOTTLES DRAWN AEROBIC AND ANAEROBIC Performed at [facility], [address], [place], [place] [address]    Culture  Setup Time   Final    GRAM POSITIVE COCCI IN CLUSTERS ANAEROBIC BOTTLE ONLY CRITICAL VALUE NOTED.  VALUE IS CONSISTENT WITH PREVIOUSLY REPORTED AND CALLED VALUE.    Culture (A)  Final    STAPHYLOCOCCUS EPIDERMIDIS CULTURE REINCUBATED FOR BETTER GROWTH Performed at [facility], [address], [place], [place] [address]    Report Status PENDING  Incomplete  C Difficile Quick Screen w PCR reflex     Status: None   Collection Time: 02/22/24  9:59 AM   Specimen: STOOL  Result Value Ref Range Status   C Diff antigen NEGATIVE NEGATIVE Final   C Diff toxin NEGATIVE NEGATIVE Final   C Diff interpretation No C. difficile detected.  Final    Comment: Performed at [facility], [address] [place], [place] [address]  Gastrointestinal Panel by PCR , Stool     Status: Abnormal   Collection Time: 02/22/24  9:59 AM   Specimen: STOOL  Result Value Ref Range Status   Campylobacter species NOT DETECTED NOT DETECTED Final   Plesimonas shigelloides NOT DETECTED NOT DETECTED Final   Salmonella species DETECTED (A) NOT DETECTED Final    Comment: RESULT CALLED TO, READ BACK BY AND VERIFIED WITH: JOANNA FELARCA @0005  ON 02/23/24 SKL    Yersinia enterocolitica NOT DETECTED NOT DETECTED Final   Vibrio species NOT DETECTED NOT DETECTED Final   Vibrio cholerae NOT DETECTED NOT DETECTED Final    Enteroaggregative E coli (EAEC) NOT DETECTED NOT DETECTED Final   Enteropathogenic E coli (EPEC) NOT DETECTED NOT DETECTED Final   Enterotoxigenic E coli (ETEC) NOT DETECTED NOT DETECTED Final   Shiga like toxin producing E coli (STEC) NOT  DETECTED NOT DETECTED Final   Shigella/Enteroinvasive E coli (EIEC) NOT DETECTED NOT DETECTED Final   Cryptosporidium NOT DETECTED NOT DETECTED Final   Cyclospora cayetanensis NOT DETECTED NOT DETECTED Final   Entamoeba histolytica NOT DETECTED NOT DETECTED Final   Giardia lamblia NOT DETECTED NOT DETECTED Final   Adenovirus F40/41 NOT DETECTED NOT DETECTED Final   Astrovirus NOT DETECTED NOT DETECTED Final   Norovirus GI/GII NOT DETECTED NOT DETECTED Final   Rotavirus A NOT DETECTED NOT DETECTED Final   Sapovirus (I, II, IV, and V) NOT DETECTED NOT DETECTED Final    Comment: Performed at [facility], [address] [place], [place] [address]  Culture, blood (Routine X 2) w Reflex to ID Panel     Status: None (Preliminary result)   Collection Time: 02/23/24 10:11 AM   Specimen: BLOOD LEFT HAND  Result Value Ref Range Status   Specimen Description   Final    BLOOD LEFT HAND BOTTLES DRAWN AEROBIC ONLY Performed at [facility], [address], [place], [place] [address]    Special Requests   Final    Blood Culture results may not be optimal due to an inadequate volume of blood received in culture bottles Performed at [facility], [address], [place], [place] [address]    Culture   Final    NO GROWTH 3 DAYS Performed at [facility], [address], [place], [place] [address]    Report Status PENDING  Incomplete  Culture, blood (Routine X 2) w Reflex to ID Panel     Status: None (Preliminary result)   Collection Time: 02/23/24 10:17 AM   Specimen: BLOOD LEFT HAND  Result Value Ref Range Status   Specimen Description   Final    BLOOD LEFT HAND BOTTLES DRAWN AEROBIC ONLY Performed at  [facility], [address], [place], [place] [address]    Special Requests   Final    Blood Culture results may not be optimal due to an inadequate volume of blood received in culture bottles Performed at [facility], [address], [place], [place] [address]    Culture   Final    NO GROWTH 3 DAYS Performed at [facility], [address], [place], [place] [address]    Report Status PENDING  Incomplete    Labs: CBC: Recent Labs  Lab 02/21/24 1654 02/22/24 0512 02/23/24 0847 02/24/24 0826 02/25/24 0531  WBC 19.5* 14.1* 6.6 6.2 6.8  NEUTROABS 17.2* 12.5*  --   --   --   HGB 14.1 13.5 11.4* 11.5* 11.0*  HCT 44.6 41.8 34.8* 35.0* 32.9*  MCV 99.1 100.2* 98.3 97.2 97.1  PLT 278 205 170 208 207   Basic Metabolic Panel: Recent Labs  Lab 02/21/24 1654 02/21/24 2342 02/22/24 0512 02/23/24 0847 02/24/24 0826 02/25/24 0531  NA 138  --  138 138 138 140  K 4.0  --  4.0 3.2* 3.7 3.9  CL 101  --  104 107 107 108  CO2 22  --  23 22 21* 22  GLUCOSE 114*  --  104* 110* 95 90  BUN 16  --  13 10 7* <5*  CREATININE 1.08*  --  0.93 0.84 0.62 0.56  CALCIUM  9.6  --  8.9 8.4* 8.5* 8.4*  MG  --  1.6* 1.9 1.9 1.9  --    Liver Function Tests: Recent Labs  Lab 02/21/24 1654 02/22/24 0512  AST  29 33  ALT 16 14  ALKPHOS 58 46  BILITOT 1.1 0.8  PROT 7.0 5.7*  ALBUMIN  4.3 3.4*   CBG: Recent Labs  Lab 02/23/24 1119  GLUCAP 101*    Discharge time spent: greater than 30 minutes.  Signed: Owen DELENA Lore, MD [facility] 02/26/2024

## 2024-02-26 NOTE — Progress Notes (Signed)
   Date of Admission:  02/21/2024   Total days of antibiotics ***        Day ***        Day ***        Day ***   ID: [name] is a 78 y.o. female with  *** Principal Problem:   Acute metabolic encephalopathy Active Problems:   HLD (hyperlipidemia)   Chronic low back pain   Severe sepsis (HCC)   Acute cystitis   Leukocytosis   Generalized weakness   History of essential hypertension    Subjective: ***  Medications:   atorvastatin   40 mg Oral Daily   vitamin B-12  500 mcg Oral Daily   enoxaparin  (LOVENOX ) injection  40 mg Subcutaneous Q24H   lidocaine   1 patch Transdermal Q24H    Objective: Vital signs in last 24 hours: Patient Vitals for the past 24 hrs:  BP Temp Temp src Pulse SpO2 Weight  02/26/24 0621 (!) 153/67 97.6 F (36.4 C) Oral -- 95 % --  02/26/24 0500 -- -- -- -- -- 79.3 kg  02/25/24 2254 (!) 153/89 98.3 F (36.8 C) Oral 87 98 % --     Lines and Device Date on insertion # of days DC  Engineer, technical sales     ETT       PHYSICAL EXAM:  General: Alert, cooperative, no distress, appears stated age.  Head: Normocephalic, without obvious abnormality, atraumatic. Eyes: Conjunctivae clear, anicteric sclerae. Pupils are equal ENT Nares normal. No drainage or sinus tenderness. Lips, mucosa, and tongue normal. No Thrush Neck: Supple, symmetrical, no adenopathy, thyroid : non tender no carotid bruit and no JVD. Back: No CVA tenderness. Lungs: Clear to auscultation bilaterally. No Wheezing or Rhonchi. No rales. Heart: Regular rate and rhythm, no murmur, rub or gallop. Abdomen: Soft, non-tender,not distended. Bowel sounds normal. No masses Extremities: atraumatic, no cyanosis. No edema. No clubbing Skin: No rashes or lesions. Or bruising Lymph: Cervical, supraclavicular normal. Neurologic: Grossly non-focal  Lab Results    Latest Ref Rng & Units 02/25/2024    5:31 AM 02/24/2024    8:26 AM 02/23/2024    8:47 AM  CBC  WBC 4.0  - 10.5 K/uL 6.8  6.2  6.6   Hemoglobin 12.0 - 15.0 g/dL 88.9  88.4  88.5   Hematocrit 36.0 - 46.0 % 32.9  35.0  34.8   Platelets 150 - 400 K/uL 207  208  170        Latest Ref Rng & Units 02/25/2024    5:31 AM 02/24/2024    8:26 AM 02/23/2024    8:47 AM  CMP  Glucose 70 - 99 mg/dL 90  95  889   BUN 8 - 23 mg/dL 5  7  10    Creatinine 0.44 - 1.00 mg/dL 9.43  9.37  9.15   Sodium 135 - 145 mmol/L 140  138  138   Potassium 3.5 - 5.1 mmol/L 3.9  3.7  3.2   Chloride 98 - 111 mmol/L 108  107  107   CO2 22 - 32 mmol/L 22  21  22    Calcium  8.9 - 10.3 mg/dL 8.4  8.5  8.4       Microbiology:  Studies/Results: No results found.   Assessment/Plan: ***

## 2024-02-26 NOTE — Progress Notes (Signed)
 Physical Therapy Treatment Patient Details Name: [name] MRN: [id number] DOB: Aug 31, 1945 Today's Date: 02/26/2024   History of Present Illness Patient is a 78 y.o. female who is admitted to [facility] on 02/21/2024 with acute metabolic encephalopathy due to severe sepsis secondary to acute cystitis after presenting from home to [facility] ED for evaluation of altered mental status. PMH:  chronic low back pain, multiple spinal surgeries, essential hypertension, hyperlipidemia    PT Comments  Pt making good progress today.  She was able to ambulate 150' with supervision and min cues for safety.  Pt's lunch arrived and she had visitors, additionally now with d/c orders so no further therapy performed at this time.     If plan is discharge home, recommend the following: A little help with walking and/or transfers;A little help with bathing/dressing/bathroom;Assistance with cooking/housework;Help with stairs or ramp for entrance;Assist for transportation   Can travel by private vehicle        Equipment Recommendations  None recommended by PT    Recommendations for Other Services       Precautions / Restrictions Precautions Precautions: Fall     Mobility  Bed Mobility Overal bed mobility: Modified Independent                  Transfers Overall transfer level: Needs assistance Equipment used: Rolling walker (2 wheels) Transfers: Sit to/from Stand Sit to Stand: Supervision                Ambulation/Gait Ambulation/Gait assistance: Supervision Gait Distance (Feet): 150 Feet Assistive device: Rolling walker (2 wheels) Gait Pattern/deviations: Decreased weight shift to right, Step-through pattern Gait velocity: decreased     General Gait Details: steady with RW, gait deviations due to leg length discrepancy , min cues for RW with turns   Optometrist     Tilt Bed    Modified Rankin (Stroke Patients Only)        Balance Overall balance assessment: Needs assistance Sitting-balance support: No upper extremity supported, Feet supported Sitting balance-Leahy Scale: Normal     Standing balance support: No upper extremity supported, Bilateral upper extremity supported Standing balance-Leahy Scale: Good Standing balance comment: RW to ambulate but could transfer without assist                            Communication    Cognition Arousal: Alert Behavior During Therapy: WFL for tasks assessed/performed   PT - Cognitive impairments: No apparent impairments                                Cueing    Exercises      General Comments        Pertinent Vitals/Pain Pain Assessment Pain Assessment: No/denies pain    Home Living                          Prior Function            PT Goals (current goals can now be found in the care plan section) Progress towards PT goals: Progressing toward goals    Frequency    Min 2X/week      PT Plan      Co-evaluation  AM-PAC PT 6 Clicks Mobility   Outcome Measure  Help needed turning from your back to your side while in a flat bed without using bedrails?: None Help needed moving from lying on your back to sitting on the side of a flat bed without using bedrails?: None Help needed moving to and from a bed to a chair (including a wheelchair)?: None Help needed standing up from a chair using your arms (e.g., wheelchair or bedside chair)?: None Help needed to walk in hospital room?: A Little Help needed climbing 3-5 steps with a railing? : A Little 6 Click Score: 22    End of Session Equipment Utilized During Treatment: Gait belt Activity Tolerance: Patient tolerated treatment well;Other (comment) (food arrived and visitors) Patient left: with call bell/phone within reach;with family/visitor present;in chair;with chair alarm set Nurse Communication: Mobility status PT Visit Diagnosis:  Difficulty in walking, not elsewhere classified (R26.2);Muscle weakness (generalized) (M62.81)     Time: 1207-1220 PT Time Calculation (min) (ACUTE ONLY): 13 min  Charges:    $Gait Training: 8-22 mins PT General Charges $$ ACUTE PT VISIT: 1 Visit                     Benjiman, PT Acute Rehab Services [facility] Re[facility] [phone]    Benjiman VEAR Mulberry 02/26/2024, 1:33 PM

## 2024-02-26 NOTE — Plan of Care (Signed)

## 2024-02-27 ENCOUNTER — Telehealth: Payer: Self-pay | Admitting: *Deleted

## 2024-02-27 DIAGNOSIS — A020 Salmonella enteritis: Secondary | ICD-10-CM

## 2024-02-27 LAB — CULTURE, BLOOD (ROUTINE X 2)

## 2024-02-27 NOTE — Transitions of Care (Post Inpatient/ED Visit) (Signed)
 02/27/2024  Name: [name] MRN: [id number] DOB: [birth year]  Today's TOC FU Call Status: Today's TOC FU Call Status:: Successful TOC FU Call Completed TOC FU Call Complete Date: 02/27/24 Patient's Name and Date of Birth confirmed.  Transition Care Management Follow-up Telephone Call Date of Discharge: 02/26/24 Discharge Facility: [facility]) Type of Discharge: Inpatient Admission Primary Inpatient Discharge Diagnosis:: Acute metabolic encephalopathy How have you been since you were released from the hospital?:  (eating, drinking well, ambulating with walker, no issues reported) Any questions or concerns?: No  Items Reviewed: Did you receive and understand the discharge instructions provided?: Yes Medications obtained,verified, and reconciled?: Yes (Medications Reviewed) Any new allergies since your discharge?: No Dietary orders reviewed?: Yes Type of Diet Ordered:: low sodium,  heart healthy Do you have support at home?: Yes People in Home [RPT]: child(ren), adult Name of Support/Comfort Primary Source: pt reports her son, daughter and law and grandchildren live with her and assist her as needed Reviewed signs/ symptoms of infection, reportable signs/ symptoms  Medications Reviewed Today: Medications Reviewed Today     Reviewed by Aura Mliss LABOR, RN (Registered Nurse) on 02/27/24 at 1107  Med List Status: <None>   Medication Order Taking? Sig Documenting Provider Last Dose Status Informant  albuterol  (VENTOLIN  HFA) 108 (90 Base) MCG/ACT inhaler [id number] Yes Inhale 2 puffs into the lungs every 6 (six) hours as needed for wheezing or shortness of breath. Severa Rock HERO, FNP  Active            Med Note STEFFI, ADELITA   Fri Feb 23, 2024  2:45 PM) Recent Dispenses    09/28/2023 108 (90 Base) MCG/ACT AERS (disp 18, 25d supply)    atorvastatin  (LIPITOR) 40 MG tablet [id number] Yes Take 1 tablet (40 mg total) by mouth daily. Gladis Mustard, FNP  Active             Med Note STEFFI, ADELITA   Fri Feb 23, 2024  2:45 PM) Recent Dispenses    09/01/2023 40 MG TABS (disp 90, 90d supply) 03/07/2023 40 MG TABS (disp 90, 90d supply)    conjugated estrogens  (PREMARIN ) vaginal cream [id number] Yes Apply 0.5 mg intravaginally once daily x 2 weeks and then twice a week Fleeta Rothman, Jomarie SAILOR, MD  Active   cyanocobalamin  (VITAMIN B12) 500 MCG tablet [id number] Yes Take 1 tablet (500 mcg total) by mouth daily. Regalado, Belkys A, MD  Active   furosemide  (LASIX ) 40 MG tablet [id number] Yes Take 40 mg by mouth daily. [provider]  Active            Med Note STEFFI, ADELITA   Fri Feb 23, 2024  2:45 PM) Recent Dispenses    09/05/2023 20 MG TABS (disp 90, 90d supply) 06/08/2023 20 MG TABS (disp 90, 90d supply) 03/07/2023 20 MG TABS (disp 90, 90d supply)    levofloxacin (LEVAQUIN) 750 MG tablet [id number]  Take 1 tablet (750 mg total) by mouth daily for 1 day.  Patient not taking: Reported on 02/27/2024   Regalado, Belkys A, MD  Active   lisinopril  (ZESTRIL ) 20 MG tablet [id number] Yes Take 1 tablet by mouth once daily Gladis Mustard, FNP  Active            Med Note STEFFI, ADELITA   Fri Feb 23, 2024  2:46 PM) Recent Dispenses    01/31/2024 20 MG TABS (disp 90, 90d supply) 11/04/2023 20 MG TABS (disp 90, 90d supply) 09/07/2023 20 MG TABS (disp 180, 90d  supply) 07/22/2023 20 MG TABS (disp 90, 90d supply)    mupirocin  ointment (BACTROBAN ) 2 % [id number] Yes Apply 1 Application topically as needed (for iritation). [provider]  Active   nystatin  (MYCOSTATIN /NYSTOP ) powder [id number] Yes Apply 1 Application topically 3 (three) times daily. Gladis Mustard, FNP  Active            Med Note STEFFI NIAN   Fri Feb 23, 2024  2:46 PM) Recent Dispenses    11/20/2023 100000 UNIT/GM POWD (disp 60, 20d supply) 11/14/2023 100000 UNIT/GM CREA (disp 30, 30d supply) 09/01/2023 100000 UNIT/GM CREA (disp 30, 30d  supply)    nystatin  cream (MYCOSTATIN ) [id number] Yes APPLY TOPICALLY AS NEEDED FOR DRY SKIN Gladis Mustard, FNP  Active            Med Note STEFFI NIAN   Fri Feb 23, 2024  2:47 PM) Recent Dispenses    11/20/2023 100000 UNIT/GM POWD (disp 60, 20d supply) 11/14/2023 100000 UNIT/GM CREA (disp 30, 30d supply) 09/01/2023 100000 UNIT/GM CREA (disp 30, 30d supply)    oxyCODONE -acetaminophen  (PERCOCET) 7.5-325 MG tablet [id number] Yes Take 1 tablet by mouth every 8 (eight) hours as needed for severe pain (pain score 7-10). Gladis Mustard, FNP  Active            Med Note STEFFI NIAN   Fri Feb 23, 2024  2:47 PM) Recent Dispenses    02/05/2024 7.5-325 MG TABS (disp 90, 30d supply) 01/06/2024 7.5-325 MG TABS (disp 90, 30d supply) 12/07/2023 7.5-325 MG TABS (disp 90, 30d supply    pantoprazole  (PROTONIX ) 40 MG tablet [id number] Yes TAKE 1 TABLET BY MOUTH ONCE DAILY BEFORE SUPPER FOR STOMACH PAIN Gladis Mustard, FNP  Active            Med Note STEFFI NIAN   Fri Feb 23, 2024  2:47 PM) Recent Dispenses    02/14/2024 40 MG TBEC (disp 90, 90d supply) 09/01/2023 40 MG TBEC (disp 90, 90d supply) 03/23/2023 40 MG TBEC (disp 90, 90d supply)    tizanidine  (ZANAFLEX ) 2 MG capsule [id number]  TAKE 1 CAPSULE BY MOUTH THREE TIMES DAILY  Patient not taking: Reported on 02/27/2024   Gladis Mustard, FNP  Active            Med Note STEFFI, NIAN   Fri Feb 23, 2024  2:47 PM) Recent Dispenses    01/05/2024 2 MG CAPS (disp 30, 10d supply) 10/19/2023 2 MG CAPS (disp 30, 10d supply) 09/11/2023 2 MG CAPS (disp 30, 10d supply)              Home Care and Equipment/Supplies: Were Home Health Services Ordered?: No Any new equipment or medical supplies ordered?: No  Functional Questionnaire: Do you need assistance with bathing/showering or dressing?: Yes Do you need assistance with meal preparation?: No Do you need assistance with eating?:  No Do you have difficulty maintaining continence: Yes (adult diapers) Do you need assistance with getting out of bed/getting out of a chair/moving?: Yes Do you have difficulty managing or taking your medications?: No  Follow up appointments reviewed: PCP Follow-up appointment confirmed?: Yes Date of PCP follow-up appointment?: 03/01/24 Follow-up Provider: Ronal Gladis [facility] Follow-up appointment confirmed?: Yes Date of Specialist follow-up appointment?: 03/01/24 Follow-Up Specialty Provider:: Infectious Disease Do you need transportation to your follow-up appointment?: No Do you understand care options if your condition(s) worsen?: Yes-patient verbalized understanding  SDOH Interventions Today    Flowsheet Row Most Recent Value  SDOH Interventions   Food Insecurity Interventions  Intervention Not Indicated  Housing Interventions Intervention Not Indicated  Transportation Interventions Intervention Not Indicated  Utilities Interventions Intervention Not Indicated    Mliss Creed [facility], BSN RN Care Manager/ Transition of Care [place]/ [facility] Population Health [phone]

## 2024-02-27 NOTE — Transitions of Care (Post Inpatient/ED Visit) (Signed)
   02/27/2024  Name: [name] MRN: [id number] DOB: 1945/08/04  Today's TOC FU Call Status: Today's TOC FU Call Status:: Unsuccessful Call (1st Attempt) Unsuccessful Call (1st Attempt) Date: 02/27/24  Attempted to reach the patient regarding the most recent Inpatient/ED visit.  Follow Up Plan: Additional outreach attempts will be made to reach the patient to complete the Transitions of Care (Post Inpatient/ED visit) call.   Mliss Creed [facility], BSN RN Care Manager/ Transition of Care [place]/ [facility] [phone]

## 2024-02-28 LAB — CULTURE, BLOOD (ROUTINE X 2)
Culture: NO GROWTH
Culture: NO GROWTH

## 2024-02-29 NOTE — Progress Notes (Unsigned)
 Subjective:    Patient ID: [name], female    DOB: 1945/09/15, 78 y.o.   MRN: [id number]   Chief Complaint: medical management of chronic issues     HPI:  [name] is a 78 y.o. who identifies as a female who was assigned female at birth.   Social history: Lives with: by herself Work history: retired   Water engineer in today for follow up of the following chronic medical issues:  1. Primary hypertension No c/o chest pain, sob or headache. Does not check blood pressure at home. BP Readings from Last 3 Encounters:  02/26/24 (!) 153/67  02/13/24 129/81  01/17/24 (!) 169/80     2. Hyperlipidemia LDL goal <70 Does not really watch diet and does no dedicated exercise. Lab Results  Component Value Date   CHOL 153 09/07/2023   HDL 89 09/07/2023   LDLCALC 52 09/07/2023   TRIG 60 09/07/2023   CHOLHDL 1.7 09/07/2023     3. Peripheral polyneuropathy Has numbness in bil feet at times  4. Peripheral edema Has some swelling in ankles by the endof the day. [name] works well for her.  5. GERD Takes protonix  daily and is doing well  6. Pseudoarthrosis L3- L5 7. Opoid dependence 8. Osteoarthritis Pain assessment: Cause of pain- pseudoarthrosis Pain location- lower back Pain on scale of 1-10- 5-6/10 Frequency- daily What increases pain-nothing really What makes pain Better-pain meds help Effects on ADL - none Any change in general medical condition-none  Current opioids rx- percocet 7.5/325-  # meds rx- 90 Effectiveness of current meds-helps when needed Adverse reactions from pain meds-none Morphine  equivalent- 33.75MME  Pill count performed-No Last drug screen - 06/13/22 ( high risk q33m, moderate risk q92m, low risk yearly ) Urine drug screen today- No Was the [facility] reviewed- yes  If yes were their any concerning findings? - no   Overdose risk: 1    09/15/2020   10:41 AM  Opioid Risk   Alcohol 0  Illegal Drugs 0  Rx Drugs 0  Alcohol 0   Illegal Drugs 0  Rx Drugs 0  Age between 16-45 years  0  History of Preadolescent Sexual Abuse 0  Psychological Disease 0  Depression 0  Opioid Risk Tool Scoring 0  Opioid Risk Interpretation Low Risk     Pain contract signed on:06/15/22   6. Depression    02/27/2024   11:10 AM 01/16/2024    9:37 AM 12/29/2023   10:21 AM  Depression screen PHQ 2/9  Decreased Interest 0 0 0  Down, Depressed, Hopeless 0 0 0  PHQ - 2 Score 0 0 0  Altered sleeping  0   Tired, decreased energy  0   Change in appetite  0   Feeling bad or failure about yourself   0   Trouble concentrating  0   Moving slowly or fidgety/restless  0   Suicidal thoughts  0   PHQ-9 Score  0   Difficult doing work/chores  Not difficult at all        7. Obesity (BMI 30-39.9) Weight is down 4 lbs  ***    New complaints: None today  Allergies  Allergen Reactions   Cashew Nut Oil Hives   Dog Epithelium Hives and Itching   Sulfa Antibiotics Other (See Comments)    blistering lips and mouth sores   Outpatient Encounter Medications as of 03/01/2024  Medication Sig   albuterol  (VENTOLIN  HFA) 108 (90 Base) MCG/ACT inhaler Inhale 2 puffs  into the lungs every 6 (six) hours as needed for wheezing or shortness of breath.   atorvastatin  (LIPITOR) 40 MG tablet Take 1 tablet (40 mg total) by mouth daily.   conjugated estrogens  (PREMARIN ) vaginal cream Apply 0.5 mg intravaginally once daily x 2 weeks and then twice a week   cyanocobalamin  (VITAMIN B12) 500 MCG tablet Take 1 tablet (500 mcg total) by mouth daily.   furosemide  (LASIX ) 40 MG tablet Take 40 mg by mouth daily.   lisinopril  (ZESTRIL ) 20 MG tablet Take 1 tablet by mouth once daily   mupirocin  ointment (BACTROBAN ) 2 % Apply 1 Application topically as needed (for iritation).   nystatin  (MYCOSTATIN /NYSTOP ) powder Apply 1 Application topically 3 (three) times daily.   nystatin  cream (MYCOSTATIN ) APPLY TOPICALLY AS NEEDED FOR DRY SKIN   oxyCODONE -acetaminophen   (PERCOCET) 7.5-325 MG tablet Take 1 tablet by mouth every 8 (eight) hours as needed for severe pain (pain score 7-10).   pantoprazole  (PROTONIX ) 40 MG tablet TAKE 1 TABLET BY MOUTH ONCE DAILY BEFORE SUPPER FOR STOMACH PAIN   tizanidine  (ZANAFLEX ) 2 MG capsule TAKE 1 CAPSULE BY MOUTH THREE TIMES DAILY (Patient not taking: Reported on 02/27/2024)   No facility-administered encounter medications on file as of 03/01/2024.    Past Surgical History:  Procedure Laterality Date   ABDOMINAL HYSTERECTOMY  11/02/10  dr dodie  @[facility]    TAH, BSO, incisional hernia repair , lysis of adhesions for endometrial cancer   ANTERIOR LUMBAR FUSION  01-28-2008   dr elsner  @[facility]    L2 -- L4   BACK SURGERY     BUNIONECTOMY Right 2003   CATARACT EXTRACTION W/ INTRAOCULAR LENS  IMPLANT, BILATERAL  2007   COLONOSCOPY  last one 12-24-2018   CYSTO/  LEFT RETROGRADE PYELOGRAM/ URETEROSCOPY STENT PLACEMENT/  OPEN URETEROLYSIS WITH OMENTAL FLAP Left 03-11-2009    dr borden  @[facility]    CYSTO/  [facility] SLING/  ANTERIOR REPAIR  12-06-2001    dr watt @[facility]    CYSTOSCOPY W/ URETERAL STENT PLACEMENT Left 12/07/2017   Procedure: CYSTOSCOPY WITH RETROGRADE PYELOGRAM/URETERAL STENT PLACEMENT;  Surgeon: Renda Glance, MD;  Location: [facility];  Service: Urology;  Laterality: Left;   CYSTOSCOPY W/ URETERAL STENT PLACEMENT Left 03/29/2018   Procedure: CYSTOSCOPY WITH STENT  EXCHANGE;  Surgeon: Renda Glance, MD;  Location: [facility];  Service: Urology;  Laterality: Left;   CYSTOSCOPY WITH RETROGRADE PYELOGRAM, URETEROSCOPY AND STENT PLACEMENT Left 12-29-2008   dr renda  @[facility]    WITH BALLOON DILATION LEFT URETERAL STRICTURE   CYSTOSCOPY WITH RETROGRADE PYELOGRAM, URETEROSCOPY AND STENT PLACEMENT Left 09-12-2008    dr watt @[facility]    CYSTOSCOPY WITH RETROGRADE PYELOGRAM, URETEROSCOPY AND STENT PLACEMENT Left 03/23/2020   Procedure: CYSTOSCOPY WITH LEFT RETROGRADE PYELOGRAM, AND LEFT STENT REMOVAL;  Surgeon: Renda Glance, MD;  Location: [facility];   Service: Urology;  Laterality: Left;   CYSTOSCOPY WITH STENT PLACEMENT Left 07/26/2018   Procedure: CYSTOSCOPY WITH STENT CHANGE;  Surgeon: Renda Glance, MD;  Location: [facility];  Service: Urology;  Laterality: Left;   CYSTOSCOPY WITH STENT PLACEMENT Left 12/03/2018   Procedure: CYSTOSCOPY WITH STENT CHANGE;  Surgeon: Renda Glance, MD;  Location: [facility];  Service: Urology;  Laterality: Left;   CYSTOSCOPY WITH STENT PLACEMENT Left 04/22/2019   Procedure: CYSTOSCOPY WITH STENT CHANGE;  Surgeon: Renda Glance, MD;  Location: [facility];  Service: Urology;  Laterality: Left;   CYSTOSCOPY WITH STENT PLACEMENT Left 10/03/2019   Procedure: CYSTOSCOPY WITH STENT CHANGE;  Surgeon: Renda Glance, MD;  Location:  [facility];  Service: Urology;  Laterality: Left;   EYE SURGERY     FOOT SURGERY Left 06-23-2010   dr vickye   left first and second toes   INSERTION OF MESH N/A 04/23/2013   Procedure: INSERTION OF MESH;  Surgeon: Elspeth KYM Schultze, MD;  Location: [facility];  Service: General;  Laterality: N/A;   JOINT REPLACEMENT     KNEE ARTHROSCOPY Right 04-27-2007  dr amy @[facility]    LAPAROSCOPIC LYSIS OF ADHESIONS N/A 04/23/2013   Procedure: LAPAROSCOPIC LYSIS OF ADHESIONS;  Surgeon: Elspeth KYM Schultze, MD;  Location: [facility];  Service: General;  Laterality: N/A;   LUMBAR SPINE SURGERY  1989 and 1999   POSTERIOR LUMBAR FUSION  12/17/2012   L3 -- L5 laminectomy and L3 -- 5 fusion   TOTAL HIP ARTHROPLASTY Left 07/04/2014   Procedure: LEFT TOTAL HIP ARTHROPLASTY ANTERIOR APPROACH;  Surgeon: Dempsey Melodi GAILS, MD;  Location: [facility];  Service: Orthopedics;  Laterality: Left;   TOTAL HIP ARTHROPLASTY Right 2009   TOTAL KNEE ARTHROPLASTY Right 09-15-2009   dr amy @[facility]    VENTRAL HERNIA REPAIR N/A 07/19/2012   Procedure: LAPAROSCOPIC LYSIS OF ADHESIONS, SMALL BOWEL RESECTION, SEROSAL REPAIR, PRIMARY VENTRAL HERNIA REPAIR;  Surgeon: Elspeth KYM Schultze, MD;  Location: [facility];  Service: General;  Laterality:  N/A;   VENTRAL HERNIA REPAIR N/A 04/23/2013   Procedure: LAPAROSCOPIC exploration and repair of hernia in abdominal VENTRAL wall  HERNIA;  Surgeon: Elspeth KYM Schultze, MD;  Location: [facility];  Service: General;  Laterality: N/A;    Family History  Problem Relation Age of Onset   Diabetes Mother    Lung cancer Mother    Diabetes Father    Liver cancer Father    Colon cancer Sister        close to 28 per pt   Diabetes Sister    Diabetes Sister    Hypertension Sister    Kidney disease Sister    Seizures Daughter    Diabetes Son    Breast cancer Neg Hx    Allergic rhinitis Neg Hx    Angioedema Neg Hx    Asthma Neg Hx    Atopy Neg Hx    Eczema Neg Hx    Immunodeficiency Neg Hx    Urticaria Neg Hx    Colon polyps Neg Hx    Esophageal cancer Neg Hx    Rectal cancer Neg Hx    Stomach cancer Neg Hx        Review of Systems  Constitutional:  Negative for diaphoresis.  Eyes:  Negative for pain.  Respiratory:  Negative for shortness of breath.   Cardiovascular:  Negative for chest pain, palpitations and leg swelling.  Gastrointestinal:  Negative for abdominal pain.  Endocrine: Negative for polydipsia.  Skin:  Negative for rash.  Neurological:  Negative for dizziness, weakness and headaches.  Hematological:  Does not bruise/bleed easily.  All other systems reviewed and are negative.      Objective:   Physical Exam Vitals and nursing note reviewed.  Constitutional:      General: She is not in acute distress.    Appearance: Normal appearance. She is well-developed.  HENT:     Head: Normocephalic.     Right Ear: Tympanic membrane normal.     Left Ear: Tympanic membrane normal.     Nose: Nose normal.     Mouth/Throat:     Mouth: Mucous membranes are moist.  Eyes:     Pupils: Pupils are equal,  round, and reactive to light.  Neck:     Vascular: No carotid bruit or JVD.  Cardiovascular:     Rate and Rhythm: Normal rate and regular rhythm.     Heart sounds: Normal heart  sounds.  Pulmonary:     Effort: Pulmonary effort is normal. No respiratory distress.     Breath sounds: Normal breath sounds. No wheezing or rales.  Chest:     Chest wall: No tenderness.  Abdominal:     General: Bowel sounds are normal. There is no distension or abdominal bruit.     Palpations: Abdomen is soft. There is no hepatomegaly, splenomegaly, mass or pulsatile mass.     Tenderness: There is no abdominal tenderness.  Musculoskeletal:        General: Normal range of motion.     Cervical back: Normal range of motion and neck supple.     Right lower leg: Edema (1+) present.     Left lower leg: Edema (1+) present.  Lymphadenopathy:     Cervical: No cervical adenopathy.  Skin:    General: Skin is warm and dry.  Neurological:     Mental Status: She is alert and oriented to person, place, and time.     Deep Tendon Reflexes: Reflexes are normal and symmetric.  Psychiatric:        Behavior: Behavior normal.        Thought Content: Thought content normal.        Judgment: Judgment normal.    There were no vitals taken for this visit.         Assessment & Plan:   [name] comes in today with chief complaint of medical management of chronic issues    Diagnosis and orders addressed:  1. Primary hypertension Low sodium diet - CBC with Differential/Platelet - CMP14+EGFR  2. Hyperlipidemia LDL goal <70 Low fat diet - Lipid panel - atorvastatin  (LIPITOR) 40 MG tablet; Take 1 tablet (40 mg total) by mouth daily.  Dispense: 90 tablet; Refill: 1  3. Peripheral polyneuropathy Do not go barefooted  4. Peripheral edema Elevate legs when sitting  5. Pseudoarthrosis L3- L5 Back stretches Pain meds filled - meloxicam  (MOBIC ) 7.5 MG tablet; Take 1 tablet (7.5 mg total) by mouth daily.  Dispense: 90 tablet; Refill: 0  6. Obesity (BMI 30-39.9) Discussed diet and exercise for person with BMI >25 Will recheck weight in 3-6 months    Labs pending Health  Maintenance reviewed Diet and exercise encouraged  Follow up plan: 3 months pain management   Mary-Margaret Gladis, FNP

## 2024-03-01 ENCOUNTER — Encounter: Payer: Self-pay | Admitting: Nurse Practitioner

## 2024-03-01 ENCOUNTER — Ambulatory Visit: Admitting: Nurse Practitioner

## 2024-03-01 VITALS — BP 171/79 | HR 64 | Temp 96.7°F | Ht 61.0 in | Wt 171.0 lb

## 2024-03-01 DIAGNOSIS — I10 Essential (primary) hypertension: Secondary | ICD-10-CM

## 2024-03-01 DIAGNOSIS — E782 Mixed hyperlipidemia: Secondary | ICD-10-CM | POA: Diagnosis not present

## 2024-03-01 DIAGNOSIS — G629 Polyneuropathy, unspecified: Secondary | ICD-10-CM

## 2024-03-01 DIAGNOSIS — F3341 Major depressive disorder, recurrent, in partial remission: Secondary | ICD-10-CM

## 2024-03-01 DIAGNOSIS — M1610 Unilateral primary osteoarthritis, unspecified hip: Secondary | ICD-10-CM

## 2024-03-01 DIAGNOSIS — F1120 Opioid dependence, uncomplicated: Secondary | ICD-10-CM

## 2024-03-01 DIAGNOSIS — E669 Obesity, unspecified: Secondary | ICD-10-CM

## 2024-03-01 DIAGNOSIS — K219 Gastro-esophageal reflux disease without esophagitis: Secondary | ICD-10-CM

## 2024-03-01 DIAGNOSIS — S32009K Unspecified fracture of unspecified lumbar vertebra, subsequent encounter for fracture with nonunion: Secondary | ICD-10-CM | POA: Diagnosis not present

## 2024-03-01 MED ORDER — PANTOPRAZOLE SODIUM 40 MG PO TBEC: DELAYED_RELEASE_TABLET | ORAL | 1 refills | Status: AC

## 2024-03-01 MED ORDER — OXYCODONE-ACETAMINOPHEN 7.5-325 MG PO TABS: 1.0000 | ORAL_TABLET | Freq: Three times a day (TID) | ORAL | 0 refills | Status: AC | PRN

## 2024-03-01 MED ORDER — LISINOPRIL 40 MG PO TABS: 40.0000 mg | ORAL_TABLET | Freq: Every day | ORAL | 1 refills | Status: AC

## 2024-03-01 MED ORDER — FUROSEMIDE 40 MG PO TABS: 40.0000 mg | ORAL_TABLET | Freq: Every day | ORAL | 1 refills | Status: AC

## 2024-03-01 NOTE — Patient Instructions (Signed)
Fall Prevention in the Home, Adult Falls can cause injuries and affect people of all ages. There are many simple things that you can do to make your home safe and to help prevent falls. If you need it, ask for help making these changes. What actions can I take to prevent falls? General information Use good lighting in all rooms. Make sure to: Replace any light bulbs that burn out. Turn on lights if it is dark and use night-lights. Keep items that you use often in easy-to-reach places. Lower the shelves around your home if needed. Move furniture so that there are clear paths around it. Do not keep throw rugs or other things on the floor that can make you trip. If any of your floors are uneven, fix them. Add color or contrast paint or tape to clearly mark and help you see: Grab bars or handrails. First and last steps of staircases. Where the edge of each step is. If you use a ladder or stepladder: Make sure that it is fully opened. Do not climb a closed ladder. Make sure the sides of the ladder are locked in place. Have someone hold the ladder while you use it. Know where your pets are as you move through your home. What can I do in the bathroom?     Keep the floor dry. Clean up any water that is on the floor right away. Remove soap buildup in the bathtub or shower. Buildup makes bathtubs and showers slippery. Use non-skid mats or decals on the floor of the bathtub or shower. Attach bath mats securely with double-sided, non-slip rug tape. If you need to sit down while you are in the shower, use a non-slip stool. Install grab bars by the toilet and in the bathtub and shower. Do not use towel bars as grab bars. What can I do in the bedroom? Make sure that you have a light by your bed that is easy to reach. Do not use any sheets or blankets on your bed that hang to the floor. Have a firm bench or chair with side arms that you can use for support when you get dressed. What can I do in  the kitchen? Clean up any spills right away. If you need to reach something above you, use a sturdy step stool that has a grab bar. Keep electrical cables out of the way. Do not use floor polish or wax that makes floors slippery. What can I do with my stairs? Do not leave anything on the stairs. Make sure that you have a light switch at the top and the bottom of the stairs. Have them installed if you do not have them. Make sure that there are handrails on both sides of the stairs. Fix handrails that are broken or loose. Make sure that handrails are as long as the staircases. Install non-slip stair treads on all stairs in your home if they do not have carpet. Avoid having throw rugs at the top or bottom of stairs, or secure the rugs with carpet tape to prevent them from moving. Choose a carpet design that does not hide the edge of steps on the stairs. Make sure that carpet is firmly attached to the stairs. Fix any carpet that is loose or worn. What can I do on the outside of my home? Use bright outdoor lighting. Repair the edges of walkways and driveways and fix any cracks. Clear paths of anything that can make you trip, such as tools or rocks. Add   color or contrast paint or tape to clearly mark and help you see high doorway thresholds. Trim any bushes or trees on the main path into your home. Check that handrails are securely fastened and in good repair. Both sides of all steps should have handrails. Install guardrails along the edges of any raised decks or porches. Have leaves, snow, and ice cleared regularly. Use sand, salt, or ice melt on walkways during winter months if you live where there is ice and snow. In the garage, clean up any spills right away, including grease or oil spills. What other actions can I take? Review your medicines with your health care provider. Some medicines can make you confused or feel dizzy. This can increase your chance of falling. Wear closed-toe shoes that  fit well and support your feet. Wear shoes that have rubber soles and low heels. Use a cane, walker, scooter, or crutches that help you move around if needed. Talk with your provider about other ways that you can decrease your risk of falls. This may include seeing a physical therapist to learn to do exercises to improve movement and strength. Where to find more information Centers for Disease Control and Prevention, [employer]: cdc.gov National Institute on Aging: nia.nih.gov National Institute on Aging: nia.nih.gov Contact a health care provider if: You are afraid of falling at home. You feel weak, drowsy, or dizzy at home. You fall at home. Get help right away if you: Lose consciousness or have trouble moving after a fall. Have a fall that causes a head injury. These symptoms may be an emergency. Get help right away. Call 911. Do not wait to see if the symptoms will go away. Do not drive yourself to the hospital. This information is not intended to replace advice given to you by your health care provider. Make sure you discuss any questions you have with your health care provider. Document Revised: 12/27/2021 Document Reviewed: 12/27/2021 Elsevier Patient Education  2024 Elsevier Inc.  

## 2024-03-04 ENCOUNTER — Other Ambulatory Visit: Payer: Self-pay | Admitting: Nurse Practitioner

## 2024-03-05 ENCOUNTER — Other Ambulatory Visit (HOSPITAL_COMMUNITY): Payer: Self-pay

## 2024-03-05 ENCOUNTER — Telehealth: Payer: Self-pay

## 2024-03-05 NOTE — Telephone Encounter (Unsigned)
 Copied from CRM [id number]. Topic: Clinical - Home Health Verbal Orders >> Mar 04, 2024  5:08 PM Delon DASEN wrote: Caller/Agency: Janace Deiters [facility] Callback Number: [phone] Service Requested: Physical Therapy Frequency: need eval first Any new concerns about the patient? No, new start of care date Wed 10/29 >> Mar 05, 2024  3:03 PM Zebedee SAUNDERS wrote: Received call from Caller/Agency: Janace Deiters [facility] Callback Number: [phone], pt refused service.

## 2024-03-05 NOTE — Telephone Encounter (Signed)
 Left message on voice mail. LS

## 2024-03-05 NOTE — Telephone Encounter (Unsigned)
 Copied from CRM [id number]. Topic: Clinical - Home Health Verbal Orders >> Mar 04, 2024  5:08 PM Delon DASEN wrote: Caller/Agency: Janace Deiters [facility] Callback Number: [phone] Service Requested: Physical Therapy Frequency: need eval first Any new concerns about the patient? No, new start of care date Wed 10/29

## 2024-03-07 NOTE — Telephone Encounter (Signed)
 FYI for PCP- Spoke with [place] with [facility] and he confirmed that patient has declined PT.

## 2024-03-14 ENCOUNTER — Other Ambulatory Visit: Payer: Self-pay | Admitting: *Deleted

## 2024-03-19 ENCOUNTER — Ambulatory Visit: Admitting: Infectious Disease

## 2024-03-26 ENCOUNTER — Ambulatory Visit (HOSPITAL_COMMUNITY)
Admission: RE | Admit: 2024-03-26 | Discharge: 2024-03-26 | Disposition: A | Discharge status: Home / Self Care | Source: Ambulatory Visit | Attending: Student | Admitting: Student

## 2024-03-26 DIAGNOSIS — R0609 Other forms of dyspnea: Secondary | ICD-10-CM | POA: Insufficient documentation

## 2024-03-26 DIAGNOSIS — I10 Essential (primary) hypertension: Secondary | ICD-10-CM | POA: Insufficient documentation

## 2024-03-26 DIAGNOSIS — R072 Precordial pain: Secondary | ICD-10-CM | POA: Insufficient documentation

## 2024-03-26 LAB — ECHOCARDIOGRAM COMPLETE
Area-P 1/2: 2.71 cm2
S' Lateral: 2.5 cm

## 2024-03-27 ENCOUNTER — Ambulatory Visit (HOSPITAL_COMMUNITY)

## 2024-03-28 ENCOUNTER — Encounter: Payer: Self-pay | Admitting: Infectious Disease

## 2024-03-28 DIAGNOSIS — Z22358 Carrier of other enterobacterales: Secondary | ICD-10-CM | POA: Insufficient documentation

## 2024-03-28 NOTE — Progress Notes (Signed)
 Subjective:  Chief Complaint: Follow-up for hospitalization with Salmonella gastroenteritis.    Patient ID: [name], female    DOB: 02/14/1946, 78 y.o.   MRN: [id number]  HPI  NOTE I wrote a note on [facility] but it disappeared  This is a 78 year old woman colonized with multidrug-resistant Klebsiella in the urine who has had lower urinary tract symptoms that we have felt not to be due to actual bacterial infection for the most part but due to noninfectious pathology and has responded well to Premarin  cream.  She was recently hospitalized with Salmonella gastroenteritis she did have Klebsiella found in the urine yet again though this was felt not to be pathogenic.  During the hospitalization she also was found to have Staphylococcus epidermidis in blood cultures but these were felt to be contaminants and repeat blood cultures taken prior to initiation of antistaphylococcal antibiotics were negative.  She completed treatment for the Salmonella infection and is now doing quite well.  She has no complaints today    Past Medical History:  Diagnosis Date   Arthritis    Bacteriuria 07/10/2023   Cancer (HCC)    ovarian,skin Ca. in mouth   Cataract    Bil cataracts removed   Chronic constipation    Chronic kidney disease    Chronic midline low back pain with sciatica    Colon polyp    large ascending colon polyp , scheduled for resection 02/ 2021   Full dentures    History of endometrial cancer 2012   s/p   TAH w/ BSO 11-02-2010   History of recurrent UTIs    History of sepsis    multiple times (some due to e.coli) last sepsis 07/ 2019   History of small bowel obstruction    x2  ,  hx bowel resection   HOH (hard of hearing)    Hyperlipidemia    Hypertension    Lower urinary tract symptoms (LUTS) 07/10/2023   Retroperitoneal fibrosis    Substance abuse (HCC)    opiod dependancy   Ureteral obstruction, left urologist-- dr renda   secondary to adhesions/  retroperitoneal fibrosis, treated with ureteral stent   Vertigo    Wears glasses     Past Surgical History:  Procedure Laterality Date   ABDOMINAL HYSTERECTOMY  11/02/10  dr dodie  @[facility]    TAH, BSO, incisional hernia repair , lysis of adhesions for endometrial cancer   ANTERIOR LUMBAR FUSION  01-28-2008   dr elsner  @[facility]    L2 -- L4   BACK SURGERY     BUNIONECTOMY Right 2003   CATARACT EXTRACTION W/ INTRAOCULAR LENS  IMPLANT, BILATERAL  2007   COLONOSCOPY  last one 12-24-2018   CYSTO/  LEFT RETROGRADE PYELOGRAM/ URETEROSCOPY STENT PLACEMENT/  OPEN URETEROLYSIS WITH OMENTAL FLAP Left 03-11-2009    dr borden  @[facility]    CYSTO/  [facility] SLING/  ANTERIOR REPAIR  12-06-2001    dr watt @[facility]    CYSTOSCOPY W/ URETERAL STENT PLACEMENT Left 12/07/2017   Procedure: CYSTOSCOPY WITH RETROGRADE PYELOGRAM/URETERAL STENT PLACEMENT;  Surgeon: Renda Glance, MD;  Location: [facility];  Service: Urology;  Laterality: Left;   CYSTOSCOPY W/ URETERAL STENT PLACEMENT Left 03/29/2018   Procedure: CYSTOSCOPY WITH STENT  EXCHANGE;  Surgeon: Renda Glance, MD;  Location: [facility];  Service: Urology;  Laterality: Left;   CYSTOSCOPY WITH RETROGRADE PYELOGRAM, URETEROSCOPY AND STENT PLACEMENT Left 12-29-2008   dr borden  @[facility]    WITH BALLOON DILATION LEFT URETERAL STRICTURE   CYSTOSCOPY WITH RETROGRADE PYELOGRAM,  URETEROSCOPY AND STENT PLACEMENT Left 09-12-2008    dr watt @[facility]    CYSTOSCOPY WITH RETROGRADE PYELOGRAM, URETEROSCOPY AND STENT PLACEMENT Left 03/23/2020   Procedure: CYSTOSCOPY WITH LEFT RETROGRADE PYELOGRAM, AND LEFT STENT REMOVAL;  Surgeon: Renda Glance, MD;  Location: [facility];  Service: Urology;  Laterality: Left;   CYSTOSCOPY WITH STENT PLACEMENT Left 07/26/2018   Procedure: CYSTOSCOPY WITH STENT CHANGE;  Surgeon: Renda Glance, MD;  Location: [facility];  Service: Urology;  Laterality: Left;   CYSTOSCOPY WITH STENT PLACEMENT Left 12/03/2018   Procedure: CYSTOSCOPY WITH STENT CHANGE;  Surgeon: Renda Glance, MD;   Location: [facility];  Service: Urology;  Laterality: Left;   CYSTOSCOPY WITH STENT PLACEMENT Left 04/22/2019   Procedure: CYSTOSCOPY WITH STENT CHANGE;  Surgeon: Renda Glance, MD;  Location: [facility];  Service: Urology;  Laterality: Left;   CYSTOSCOPY WITH STENT PLACEMENT Left 10/03/2019   Procedure: CYSTOSCOPY WITH STENT CHANGE;  Surgeon: Renda Glance, MD;  Location: [facility];  Service: Urology;  Laterality: Left;   EYE SURGERY     FOOT SURGERY Left 06-23-2010   dr vickye   left first and second toes   INSERTION OF MESH N/A 04/23/2013   Procedure: INSERTION OF MESH;  Surgeon: Elspeth KYM Schultze, MD;  Location: [facility];  Service: General;  Laterality: N/A;   JOINT REPLACEMENT     KNEE ARTHROSCOPY Right 04-27-2007  dr amy @[facility]    LAPAROSCOPIC LYSIS OF ADHESIONS N/A 04/23/2013   Procedure: LAPAROSCOPIC LYSIS OF ADHESIONS;  Surgeon: Elspeth KYM Schultze, MD;  Location: [facility];  Service: General;  Laterality: N/A;   LUMBAR SPINE SURGERY  1989 and 1999   POSTERIOR LUMBAR FUSION  12/17/2012   L3 -- L5 laminectomy and L3 -- 5 fusion   TOTAL HIP ARTHROPLASTY Left 07/04/2014   Procedure: LEFT TOTAL HIP ARTHROPLASTY ANTERIOR APPROACH;  Surgeon: Dempsey Melodi GAILS, MD;  Location: [facility];  Service: Orthopedics;  Laterality: Left;   TOTAL HIP ARTHROPLASTY Right 2009   TOTAL KNEE ARTHROPLASTY Right 09-15-2009   dr amy @[facility]    VENTRAL HERNIA REPAIR N/A 07/19/2012   Procedure: LAPAROSCOPIC LYSIS OF ADHESIONS, SMALL BOWEL RESECTION, SEROSAL REPAIR, PRIMARY VENTRAL HERNIA REPAIR;  Surgeon: Elspeth KYM Schultze, MD;  Location: [facility];  Service: General;  Laterality: N/A;   VENTRAL HERNIA REPAIR N/A 04/23/2013   Procedure: LAPAROSCOPIC exploration and repair of hernia in abdominal VENTRAL wall  HERNIA;  Surgeon: Elspeth KYM Schultze, MD;  Location: [facility];  Service: General;  Laterality: N/A;    Family History  Problem Relation Age of Onset   Diabetes Mother    Lung cancer Mother    Diabetes Father     Liver cancer Father    Colon cancer Sister        close to 74 per pt   Diabetes Sister    Diabetes Sister    Hypertension Sister    Kidney disease Sister    Seizures Daughter    Diabetes Son    Breast cancer Neg Hx    Allergic rhinitis Neg Hx    Angioedema Neg Hx    Asthma Neg Hx    Atopy Neg Hx    Eczema Neg Hx    Immunodeficiency Neg Hx    Urticaria Neg Hx    Colon polyps Neg Hx    Esophageal cancer Neg Hx    Rectal cancer Neg Hx    Stomach cancer Neg Hx       Social History   Socioeconomic History  Marital status: Widowed    Spouse name: Not on file   Number of children: 3   Years of education: 12   Highest education level: High school graduate  Occupational History   Occupation: Retired  Tobacco Use   Smoking status: Never   Smokeless tobacco: Never  Vaping Use   Vaping status: Never Used  Substance and Sexual Activity   Alcohol use: No   Drug use: No   Sexual activity: Not Currently    Birth control/protection: Surgical  Other Topics Concern   Not on file  Social History Narrative   Her son and DIL live with her   They help with bills   Social Drivers of Health   Financial Resource Strain: Low Risk  (06/28/2023)   Overall Financial Resource Strain (CARDIA)    Difficulty of Paying Living Expenses: Not hard at all  Food Insecurity: No Food Insecurity (02/27/2024)   Hunger Vital Sign    Worried About Running Out of Food in the Last Year: Never true    Ran Out of Food in the Last Year: Never true  Transportation Needs: No Transportation Needs (02/27/2024)   PRAPARE - Administrator, Civil Service (Medical): No    Lack of Transportation (Non-Medical): No  Physical Activity: Insufficiently Active (06/28/2023)   Exercise Vital Sign    Days of Exercise per Week: 3 days    Minutes of Exercise per Session: 30 min  Stress: No Stress Concern Present (06/28/2023)   [employer] of Occupational Health - Occupational Stress Questionnaire     Feeling of Stress : Not at all  Social Connections: Moderately Isolated (02/21/2024)   Social Connection and Isolation Panel    Frequency of Communication with Friends and Family: Once a week    Frequency of Social Gatherings with Friends and Family: Once a week    Attends Religious Services: More than 4 times per year    Active Member of [employer] or Organizations: Yes    Attends Banker Meetings: More than 4 times per year    Marital Status: Widowed    Allergies  Allergen Reactions   Cashew Nut Oil Hives   Dog Epithelium Hives and Itching   Sulfa Antibiotics Other (See Comments)    blistering lips and mouth sores     Current Outpatient Medications:    albuterol  (VENTOLIN  HFA) 108 (90 Base) MCG/ACT inhaler, Inhale 2 puffs into the lungs every 6 (six) hours as needed for wheezing or shortness of breath., Disp: 8 g, Rfl: 2   atorvastatin  (LIPITOR) 40 MG tablet, Take 1 tablet (40 mg total) by mouth daily., Disp: 90 tablet, Rfl: 1   conjugated estrogens  (PREMARIN ) vaginal cream, Apply 0.5 mg intravaginally once daily x 2 weeks and then twice a week, Disp: 42.5 g, Rfl: 12   cyanocobalamin  (VITAMIN B12) 500 MCG tablet, Take 1 tablet (500 mcg total) by mouth daily., Disp: 30 tablet, Rfl: 0   furosemide  (LASIX ) 40 MG tablet, Take 1 tablet (40 mg total) by mouth daily., Disp: 90 tablet, Rfl: 1   lisinopril  (ZESTRIL ) 40 MG tablet, Take 1 tablet (40 mg total) by mouth daily., Disp: 90 tablet, Rfl: 1   mupirocin  ointment (BACTROBAN ) 2 %, Apply 1 Application topically as needed (for iritation)., Disp: , Rfl:    nystatin  (MYCOSTATIN /NYSTOP ) powder, Apply 1 Application topically 3 (three) times daily., Disp: 60 g, Rfl: 2   nystatin  cream (MYCOSTATIN ), APPLY TOPICALLY AS NEEDED FOR DRY SKIN, Disp: 30 g, Rfl: 0  oxyCODONE -acetaminophen  (PERCOCET) 7.5-325 MG tablet, Take 1 tablet by mouth every 8 (eight) hours as needed for severe pain (pain score 7-10)., Disp: 90 tablet, Rfl: 0   [START ON  05/04/2024] oxyCODONE -acetaminophen  (PERCOCET) 7.5-325 MG tablet, Take 1 tablet by mouth every 8 (eight) hours as needed for severe pain (pain score 7-10)., Disp: 90 tablet, Rfl: 0   [START ON 04/04/2024] oxyCODONE -acetaminophen  (PERCOCET) 7.5-325 MG tablet, Take 1 tablet by mouth every 8 (eight) hours as needed for severe pain (pain score 7-10)., Disp: 90 tablet, Rfl: 0   pantoprazole  (PROTONIX ) 40 MG tablet, TAKE 1 TABLET BY MOUTH ONCE DAILY BEFORE SUPPER FOR STOMACH PAIN, Disp: 90 tablet, Rfl: 1   Review of Systems  Constitutional:  Negative for activity change, appetite change, chills, diaphoresis, fatigue, fever and unexpected weight change.  HENT:  Negative for congestion, rhinorrhea, sinus pressure, sneezing, sore throat and trouble swallowing.   Eyes:  Negative for photophobia and visual disturbance.  Respiratory:  Negative for cough, chest tightness, shortness of breath, wheezing and stridor.   Cardiovascular:  Negative for chest pain, palpitations and leg swelling.  Gastrointestinal:  Negative for abdominal distention, abdominal pain, anal bleeding, blood in stool, constipation, diarrhea, nausea and vomiting.  Genitourinary:  Negative for difficulty urinating, dysuria, flank pain and hematuria.  Musculoskeletal:  Negative for arthralgias, back pain, gait problem, joint swelling and myalgias.  Skin:  Negative for color change, pallor, rash and wound.  Neurological:  Negative for dizziness, tremors, weakness and light-headedness.  Hematological:  Negative for adenopathy. Does not bruise/bleed easily.  Psychiatric/Behavioral:  Negative for agitation, behavioral problems, confusion, decreased concentration, dysphoric mood and sleep disturbance.        Objective:   Physical Exam Constitutional:      General: She is not in acute distress.    Appearance: Normal appearance. She is well-developed. She is not ill-appearing or diaphoretic.  HENT:     Head: Normocephalic and atraumatic.      Right Ear: Hearing and external ear normal.     Left Ear: Hearing and external ear normal.     Nose: No nasal deformity or rhinorrhea.  Eyes:     General: No scleral icterus.    Conjunctiva/sclera: Conjunctivae normal.     Right eye: Right conjunctiva is not injected.     Left eye: Left conjunctiva is not injected.     Pupils: Pupils are equal, round, and reactive to light.  Neck:     Vascular: No JVD.  Cardiovascular:     Rate and Rhythm: Normal rate and regular rhythm.     Heart sounds: S1 normal and S2 normal.  Pulmonary:     Effort: Pulmonary effort is normal. No respiratory distress.     Breath sounds: No wheezing.  Abdominal:     General: Bowel sounds are normal. There is no distension.     Palpations: Abdomen is soft.     Tenderness: There is no abdominal tenderness.  Musculoskeletal:        General: Normal range of motion.     Right shoulder: Normal.     Left shoulder: Normal.     Cervical back: Normal range of motion and neck supple.     Right hip: Normal.     Left hip: Normal.     Right knee: Normal.     Left knee: Normal.  Lymphadenopathy:     Head:     Right side of head: No submandibular, preauricular or posterior auricular adenopathy.     Left  side of head: No submandibular, preauricular or posterior auricular adenopathy.     Cervical: No cervical adenopathy.     Right cervical: No superficial or deep cervical adenopathy.    Left cervical: No superficial or deep cervical adenopathy.  Skin:    General: Skin is warm and dry.     Coloration: Skin is not pale.     Findings: No abrasion, bruising, ecchymosis, erythema, lesion or rash.     Nails: There is no clubbing.  Neurological:     Mental Status: She is alert and oriented to person, place, and time.     Sensory: No sensory deficit.     Coordination: Coordination normal.     Gait: Gait normal.  Psychiatric:        Attention and Perception: She is attentive.        Mood and Affect: Mood normal.         Speech: Speech normal.        Behavior: Behavior normal. Behavior is cooperative.        Thought Content: Thought content normal.        Judgment: Judgment normal.           Assessment & Plan:   Salmonella gastroenteritis: Resolved  Lower urinary tract symptoms that are but not thought to be due to urinary tract infection but rather noninfectious pathology continue Premarin  cream  Colonization with multidrug-resistant Klebsiella: Certainly if she does acquire actual infection she would unfortunately have a likelihood of needing a carbapenem therapy though maybe she will become colonized with a different organism eventually.  Staph epidermidis in the blood she seems to have thought that this was a true bacteremia and that it originated in her urine but I went over the data with her and that the feelings of my partners that this was contaminant.

## 2024-04-01 ENCOUNTER — Other Ambulatory Visit: Payer: Self-pay

## 2024-04-01 ENCOUNTER — Ambulatory Visit (INDEPENDENT_AMBULATORY_CARE_PROVIDER_SITE_OTHER): Admitting: Infectious Disease

## 2024-04-01 ENCOUNTER — Encounter: Payer: Self-pay | Admitting: Infectious Disease

## 2024-04-01 ENCOUNTER — Encounter (HOSPITAL_COMMUNITY): Payer: Self-pay

## 2024-04-01 VITALS — BP 130/79 | HR 89 | Temp 97.6°F | Ht 61.0 in | Wt 160.0 lb

## 2024-04-01 DIAGNOSIS — A020 Salmonella enteritis: Secondary | ICD-10-CM

## 2024-04-01 DIAGNOSIS — R8271 Bacteriuria: Secondary | ICD-10-CM

## 2024-04-01 DIAGNOSIS — Z22358 Carrier of other enterobacterales: Secondary | ICD-10-CM | POA: Diagnosis not present

## 2024-04-01 DIAGNOSIS — R799 Abnormal finding of blood chemistry, unspecified: Secondary | ICD-10-CM | POA: Insufficient documentation

## 2024-04-01 DIAGNOSIS — R399 Unspecified symptoms and signs involving the genitourinary system: Secondary | ICD-10-CM | POA: Diagnosis not present

## 2024-04-01 DIAGNOSIS — B957 Other staphylococcus as the cause of diseases classified elsewhere: Secondary | ICD-10-CM

## 2024-04-03 ENCOUNTER — Ambulatory Visit (HOSPITAL_COMMUNITY)
Admission: RE | Admit: 2024-04-03 | Discharge: 2024-04-03 | Disposition: A | Discharge status: Home / Self Care | Source: Ambulatory Visit | Attending: Cardiology | Admitting: Cardiology

## 2024-04-03 DIAGNOSIS — R072 Precordial pain: Secondary | ICD-10-CM | POA: Insufficient documentation

## 2024-04-03 MED ORDER — NITROGLYCERIN 0.4 MG SL SUBL
0.8000 mg | SUBLINGUAL_TABLET | Freq: Once | SUBLINGUAL | Status: AC
  Administered 2024-04-03: 0.8 mg via SUBLINGUAL

## 2024-04-03 MED ORDER — IOHEXOL 350 MG/ML SOLN
100.0000 mL | Freq: Once | INTRAVENOUS | Status: AC | PRN
  Administered 2024-04-03: 100 mL via INTRAVENOUS

## 2024-04-10 NOTE — Progress Notes (Signed)
 Cardiology Office Note:    Date:  04/16/2024   ID:  [name], DOB June 25, 1945, MRN [id number]  PCP:  Gladis Mustard, FNP  Cardiologist:  Oneil Parchment, MD     Referring MD: Gladis [name], *   Chief Complaint: follow-up of diaphoresis and chest discomfort   History of Present Illness:    [name] is a 78 y.o. female with a history of mild to moderate non-obstructive CAD noted on coronary CTA in 03/2024, mild dilation of the ascending aorta, hypertension, hyperlipidemia, small bowel obstruction, retroperitoneal fibrosis, chronic back pain, chronic constipation who is followed by Dr. Parchment who presents today for follow-up of diaphoresis and chest discomfort.   Patient was referred to Dr. Parchment in 11/2021 for further evaluation of chest pain.  Coronary CTA was ordered and showed a coronary calcium  score of 606 (86 percentile for age and sex) with only mild to moderate atherosclerosis.  FFR did not demonstrate any hemodynamically flow-limiting lesions.  Echo was also ordered for further evaluation of her murmur noted on exam and showed LVEF of 60-65% with normal wall motion and moderate LVH of the basal septal segment, normal RV function, no significant valvular disease (only trivial MR).  She has not been seen by Cardiology since 2023.   PCP recently ordered a monitor in 12/2023 for further evaluation of diaphoresis which showed 91 short episodes of SVT (longest run 17 beats) as well as occasional PACs (2.9%) and rare PVCs (<1.0%).   She was last seen by me in 02/2024 at which time she reported diaphoresis with exertion. She had already had a thorough work-up of this by her PCP and ID including CBC, CMET, thyroid  panel, hormone panel, urinalysis/ urine cultures, and blood cultures. Urine culture grew Klebsiella but otherwise work-up unremarkable.  PCP also ordered a heart monitor which showed  91 short episodes of SVT (longest run 17 beats) as well as occasional PACs  (2.9%) and rare PVCs (<1.0%). She also reported some chest tightness/ squeezing/ pressure at rest and dyspnea on exertion. Coronary CTA and Echo were ordered for further evaluation. Before these could be done, she was admitted from 02/21/2024 to 02/26/2024 for severe sepsis secondary acute colitis and salmonella gastroenteritis after presenting with altered mental status. Blood cultures were positive for Staph epidermis but this was felt to be a contaminant. Urine cultures was positive for Klebsiella pneumonia but this was felt to likely be a contaminant or colonization as she did not have any UTI symptoms. She was seen by ID during admission and treated with antibiotics.   Echo on 03/26/2024 showed LVEF of 55-60% with normal wall motion and diastolic parameters, normal RV function, no significant valvular disease, and mild dilatation of the ascending aorta (measuring 40mm). Coronary CTA on 04/03/2024 showed a coronary calcium  score of 956 (91st percentile for age and sex) with mild non-obstructive CAD.   Patient presents today for follow-up. She is here with her granddaughter who is also her caregiver. She denies any recurrent diaphoresis since being treated for salmonella gastroenteritis. She does report palpitations with increase heart rate that typically occurs after she has been up walking for a couple of minutes. She has associated chest tightness and shortness of breath with this and then will have to sit down. She denies any palpitations outside of ambulating and denies any chest tightness or shortness of breath outside of palpitations. She states heart rates are usually in the 50s to 80s but will increase to the 80 during these episodes.  She states she was having these symptoms back in 12/2023 when she worse the monitor as well. She denies any orthopnea or PND. She has chronic left foot/ leg swelling which she has had for years and this is stable. No lightheadedness/ dizziness, or syncope.     EKGs/Labs/Other Studies Reviewed:    The following studies were reviewed:  Monitor 12/15/2023 to 12/29/2023: Patient had a min HR of 33 bpm, max HR of 176 bpm, and avg HR of 64 bpm. Predominant underlying rhythm was Sinus Rhythm. 91 Supraventricular Tachycardia runs occurred, the run with the fastest interval lasting 10 beats with a max rate of 176 bpm, the  longest lasting 17 beats with an avg rate of 104 bpm. Isolated SVEs were occasional (2.9%, 63334), SVE Couplets were rare (<1.0%, 439), and SVE Triplets were rare (<1.0%, 93). Isolated VEs were rare (<1.0%), VE Couplets were rare (<1.0%), and no VE  Triplets were present.  _______________  Echocardiogram 03/26/2024: Impressions: 1. Left ventricular ejection fraction, by estimation, is 55 to 60%. Left  ventricular ejection fraction by 3D volume is 55 %. The left ventricle has  normal function. The left ventricle has no regional wall motion  abnormalities. Left ventricular diastolic   parameters were normal.   2. Right ventricular systolic function is normal. The right ventricular  size is normal.   3. The mitral valve is grossly normal. Trivial mitral valve  regurgitation.   4. The aortic valve is tricuspid. Aortic valve regurgitation is not  visualized.   5. Aortic dilatation noted. There is mild dilatation of the ascending  aorta, measuring 40 mm.  _______________  Coronary CTA 04/03/2024: Impressions: 1. Coronary calcium  score of 956. This was 91st percentile for age, sex, and race matched control. 2. Normal coronary origin with right dominance. 3. CAD-RADS 2. Mild non-obstructive CAD (25-49%). Consider non-atherosclerotic causes of chest pain. Consider preventive therapy and risk factor modification.  EKG:  EKG not ordered today.   Recent Labs: 02/21/2024: TSH 0.208 02/22/2024: ALT 14 02/24/2024: Magnesium  1.9 02/25/2024: BUN <5; Creatinine, Ser 0.56; Hemoglobin 11.0; Platelets 207; Potassium 3.9; Sodium 140   Recent Lipid Panel    Component Value Date/Time   CHOL 153 09/07/2023 1129   CHOL 154 11/13/2012 1018   TRIG 60 09/07/2023 1129   TRIG 86 11/13/2012 1018   HDL 89 09/07/2023 1129   HDL 57 11/13/2012 1018   CHOLHDL 1.7 09/07/2023 1129   LDLCALC 52 09/07/2023 1129   LDLCALC 80 11/13/2012 1018    Physical Exam:    Vital Signs: BP 118/72   Pulse 69   Ht 5' (1.524 m)   Wt 160 lb (72.6 kg)   SpO2 93%   BMI 31.25 kg/m     Wt Readings from Last 3 Encounters:  04/16/24 160 lb (72.6 kg)  04/01/24 160 lb (72.6 kg)  03/01/24 171 lb (77.6 kg)     General: 78 y.o. Caucasian female in no acute distress. HEENT: Normocephalic and atraumatic. Sclera clear.  Neck: Supple. No JVD. Heart: RRR. Distinct S1 and S2. No murmurs, gallops, or rubs.  Lungs: No increased work of breathing. Clear to ausculation bilaterally. No wheezes, rhonchi, or rales.  Extremities: Mild left lower extremity edema (chronic). No lower extremity edema.   Skin: Warm and dry. Neuro: No focal deficits. Psych: Normal affect. Responds appropriately.  Assessment:    1. Coronary artery disease involving native coronary artery of native heart without angina pectoris   2. Palpitations   3. Paroxysmal SVT (supraventricular tachycardia)  4. Primary hypertension   5. Hyperlipidemia, unspecified hyperlipidemia type     Plan:    Mild Non-Obstructive CAD Patient has had significant diaphoresis with exertion over the last few months. She has had extensive work-up by PCP and ID for this. Monitor in 12/2023 showed short runs of SVT but no significant arrhythmias. She also reported some new chest pressure (mostly at rest) and mild dyspnea on exertion at last visit in 02/2024. Coronary CTA showed  a coronary calcium  score of 956 (91st percentile for age and sex) with mild non-obstructive CAD. Echo showed normal LV function, normal RV function, and no significant valvular disease.  - Patient denies any recurrent diaphoresis but  reports some mild chest tightness and shortness of breath in the setting of palpitations with ambulation but nothing outside these times.   - Continue Aspirin 81mg  daily.  - Continue Lipitor 40mg  daily. - Patient has had a reassuring cardiac work-up. Suspect symptoms may be due to deconditioning. Recommended patient slowly increase physical activity. Asked patient to let us  know if she has any chest discomfort or shortness of breath outside of palpitations.   Palpitations Paroxysmal SVT Recent monitor in 12/2023 showed 91 short episodes of SVT (longest run 17 beats) as well as occasional PACs (2.9%) and rare PVCs (<1.0%).  - She reports palpitations with an increase in heart rate while ambulating. She has associated chest tightness and shortness of breath with this. No lightheadedness/ dizziness or syncope.  - She states her heart rates are normally in the 50s to 60s but increase to the 80s during these episodes.  - Suspect symptoms are due to deconditioning. Low suspicion for arrhythmia. Will hold off on a beta-blocker given baseline slow heart rates. Recommended slowly increasing physical activity.    Hypertension BP well controlled.  - Continue Lisinopril  20mg  daily.  - Also on Lasix  40mg  daily for edema.   Hyperlipidemia Lipid panel in 09/2023: Total Cholesterol 143, Triglycerides 60, HDL 89, LDL 52. LDL goal <70.  - Continue Lipitor 40mg  daily. - Labs followed by PCP.  Disposition: Follow up in 4 months.    Signed, Barbaraann Avans E Maham Quintin, PA-C  04/16/2024 12:45 PM    [facility]

## 2024-04-12 LAB — MISCELLANEOUS TEST

## 2024-04-16 ENCOUNTER — Ambulatory Visit: Attending: Student | Admitting: Student

## 2024-04-16 ENCOUNTER — Encounter: Payer: Self-pay | Admitting: Student

## 2024-04-16 VITALS — BP 118/72 | HR 69 | Ht 60.0 in | Wt 160.0 lb

## 2024-04-16 DIAGNOSIS — E785 Hyperlipidemia, unspecified: Secondary | ICD-10-CM

## 2024-04-16 DIAGNOSIS — I10 Essential (primary) hypertension: Secondary | ICD-10-CM

## 2024-04-16 DIAGNOSIS — I471 Supraventricular tachycardia, unspecified: Secondary | ICD-10-CM

## 2024-04-16 DIAGNOSIS — R002 Palpitations: Secondary | ICD-10-CM

## 2024-04-16 DIAGNOSIS — I2510 Atherosclerotic heart disease of native coronary artery without angina pectoris: Secondary | ICD-10-CM

## 2024-04-16 NOTE — Patient Instructions (Signed)
 Thank you for choosing [facility]!     Medication Instructions:  No medication changes were made during today's visit.  *If you need a refill on your cardiac medications before your next appointment, please call your pharmacy*   Lab Work: No labs were ordered during today's visit.  If you have labs (blood work) drawn today and your tests are completely normal, you will receive your results only by: MyChart Message (if you have MyChart) OR A paper copy in the mail If you have any lab test that is abnormal or we need to change your treatment, we will call you to review the results.   Testing/Procedures: No procedures were ordered during today's visit.   Your next appointment:   4 month(s)   Provider:   Oneil Parchment, MD     Follow-Up: At [facility], you and your health needs are our priority.  As part of our continuing mission to provide you with exceptional heart care, we have created designated Provider Care Teams.  These Care Teams include your primary Cardiologist (physician) and Advanced Practice Providers (APPs -  Physician Assistants and Nurse Practitioners) who all work together to provide you with the care you need, when you need it. We recommend signing up for the patient portal called MyChart.  Sign up information is provided on this After Visit Summary.  MyChart is used to connect with patients for Virtual Visits (Telemedicine).  Patients are able to view lab/test results, encounter notes, upcoming appointments, etc.  Non-urgent messages can be sent to your provider as well.   To learn more about what you can do with MyChart, go to [email or web address].

## 2024-04-26 ENCOUNTER — Other Ambulatory Visit: Payer: Self-pay | Admitting: Nurse Practitioner

## 2024-04-26 DIAGNOSIS — I10 Essential (primary) hypertension: Secondary | ICD-10-CM

## 2024-05-03 ENCOUNTER — Other Ambulatory Visit: Payer: Self-pay | Admitting: Nurse Practitioner

## 2024-05-03 DIAGNOSIS — I10 Essential (primary) hypertension: Secondary | ICD-10-CM

## 2024-05-03 NOTE — Telephone Encounter (Signed)
 Copied from CRM #[id number]. Topic: Clinical - Medication Refill >> May 03, 2024 10:37 AM [name] wrote: Medication: lisinopril  (ZESTRIL ) 40 MG tablet  Has the patient contacted their pharmacy? Yes (Agent: If no, request that the patient contact the pharmacy for the refill. If patient does not wish to contact the pharmacy document the reason why and proceed with request.) (Agent: If yes, when and what did the pharmacy advise?)  This is the patient's preferred pharmacy:  [id number] - [place], [place] - [address] [place] [address] Phone: [phone] Fax: [phone]   Is this the correct pharmacy for this prescription? Yes If no, delete pharmacy and type the correct one.   Has the prescription been filled recently? Yes  Is the patient out of the medication? Yes  Has the patient been seen for an appointment in the last year OR does the patient have an upcoming appointment? Yes  Can we respond through MyChart? Yes  Agent: Please be advised that Rx refills may take up to 3 business days. We ask that you follow-up with your pharmacy.

## 2024-05-15 ENCOUNTER — Telehealth: Payer: Self-pay | Admitting: Nurse Practitioner

## 2024-05-15 NOTE — Telephone Encounter (Unsigned)
 Copied from CRM #[id number]. Topic: General - Other >> May 15, 2024 10:50 AM Miquel SAILOR wrote: Reason for CRM: PT requesting copy of hr insurance card due to lost it and has to file a billing claim. Need call back on when she can pick it up fro office [phone]

## 2024-05-15 NOTE — Telephone Encounter (Signed)
 Pt aware copy of ins card ready to pick up

## 2024-06-03 ENCOUNTER — Telehealth: Payer: Self-pay | Admitting: Nurse Practitioner

## 2024-06-03 NOTE — Telephone Encounter (Signed)
 Called and left message to inform patient that our office will be opening at 10 am on 06/04/24  Please reschedule if patient returns call

## 2024-06-04 ENCOUNTER — Ambulatory Visit (INDEPENDENT_AMBULATORY_CARE_PROVIDER_SITE_OTHER): Payer: Self-pay | Admitting: Nurse Practitioner

## 2024-06-04 ENCOUNTER — Encounter: Payer: Self-pay | Admitting: Nurse Practitioner

## 2024-06-04 ENCOUNTER — Ambulatory Visit: Payer: Self-pay | Admitting: Nurse Practitioner

## 2024-06-04 VITALS — BP 110/68 | HR 75 | Temp 97.6°F | Ht 60.0 in | Wt 161.0 lb

## 2024-06-04 DIAGNOSIS — S32009K Unspecified fracture of unspecified lumbar vertebra, subsequent encounter for fracture with nonunion: Secondary | ICD-10-CM

## 2024-06-04 MED ORDER — OXYCODONE-ACETAMINOPHEN 7.5-325 MG PO TABS: 1.0000 | ORAL_TABLET | ORAL | 0 refills | Status: DC | PRN

## 2024-06-04 MED ORDER — OXYCODONE-ACETAMINOPHEN 7.5-325 MG PO TABS: 1.0000 | ORAL_TABLET | ORAL | 0 refills | Status: AC | PRN

## 2024-06-04 NOTE — Patient Instructions (Signed)
 Fall Prevention in the Home, Adult Falls can cause injuries and can happen to people of all ages. There are many things you can do to make your home safer and to help prevent falls. What actions can I take to prevent falls? General information Use good lighting in all rooms. Make sure to: Replace any light bulbs that burn out. Turn on the lights in dark areas and use night-lights. Keep items that you use often in easy-to-reach places. Lower the shelves around your home if needed. Move furniture so that there are clear paths around it. Do not use throw rugs or other things on the floor that can make you trip. If any of your floors are uneven, fix them. Add color or contrast paint or tape to clearly mark and help you see: Grab bars or handrails. First and last steps of staircases. Where the edge of each step is. If you use a ladder or stepladder: Make sure that it is fully opened. Do not climb a closed ladder. Make sure the sides of the ladder are locked in place. Have someone hold the ladder while you use it. Know where your pets are as you move through your home. What can I do in the bathroom?     Keep the floor dry. Clean up any water on the floor right away. Remove soap buildup in the bathtub or shower. Buildup makes bathtubs and showers slippery. Use non-skid mats or decals on the floor of the bathtub or shower. Attach bath mats securely with double-sided, non-slip rug tape. If you need to sit down in the shower, use a non-slip stool. Install grab bars by the toilet and in the bathtub and shower. Do not use towel bars as grab bars. What can I do in the bedroom? Make sure that you have a light by your bed that is easy to reach. Do not use any sheets or blankets on your bed that hang to the floor. Have a firm chair or bench with side arms that you can use for support when you get dressed. What can I do in the kitchen? Clean up any spills right away. If you need to reach something  above you, use a step stool with a grab bar. Keep electrical cords out of the way. Do not use floor polish or wax that makes floors slippery. What can I do with my stairs? Do not leave anything on the stairs. Make sure that you have a light switch at the top and the bottom of the stairs. Make sure that there are handrails on both sides of the stairs. Fix handrails that are broken or loose. Install non-slip stair treads on all your stairs if they do not have carpet. Avoid having throw rugs at the top or bottom of the stairs. Choose a carpet that does not hide the edge of the steps on the stairs. Make sure that the carpet is firmly attached to the stairs. Fix carpet that is loose or worn. What can I do on the outside of my home? Use bright outdoor lighting. Fix the edges of walkways and driveways and fix any cracks. Clear paths of anything that can make you trip, such as tools or rocks. Add color or contrast paint or tape to clearly mark and help you see anything that might make you trip as you walk through a door, such as a raised step or threshold. Trim any bushes or trees on paths to your home. Check to see if handrails are loose  or broken and that both sides of all steps have handrails. Install guardrails along the edges of any raised decks and porches. Have leaves, snow, or ice cleared regularly. Use sand, salt, or ice melter on paths if you live where there is ice and snow during the winter. Clean up any spills in your garage right away. This includes grease or oil spills. What other actions can I take? Review your medicines with your doctor. Some medicines can cause dizziness or changes in blood pressure, which increase your risk of falling. Wear shoes that: Have a low heel. Do not wear high heels. Have rubber bottoms and are closed at the toe. Feel good on your feet and fit well. Use tools that help you move around if needed. These include: Canes. Walkers. Scooters. Crutches. Ask  your doctor what else you can do to help prevent falls. This may include seeing a physical therapist to learn to do exercises to move better and get stronger. Where to find more information Centers for Disease Control and Prevention, [employer]: TonerPromos.no [employer] on Aging: BaseRingTones.pl National Institute on Aging: BaseRingTones.pl Contact a doctor if: You are afraid of falling at home. You feel weak, drowsy, or dizzy at home. You fall at home. Get help right away if you: Lose consciousness or have trouble moving after a fall. Have a fall that causes a head injury. These symptoms may be an emergency. Get help right away. Call 911. Do not wait to see if the symptoms will go away. Do not drive yourself to the hospital. This information is not intended to replace advice given to you by your health care provider. Make sure you discuss any questions you have with your health care provider. Document Revised: 12/27/2021 Document Reviewed: 12/27/2021 Elsevier Patient Education  2024 [employer].

## 2024-06-04 NOTE — Progress Notes (Signed)
 "  Subjective:    Patient ID: [name], female    DOB: March 12, 1946, 79 y.o.   MRN: [id number]   Chief Complaint: pain management  HPI  Patient comes in today for follow up of chronic low back pain. She has had for several years.  Pain assessment: Cause of pain- DDD Pain location- low back pain Pain on scale of 1-10- 7/10 currently Frequency- daily What increases pain-activity What makes pain Better-rest helsp Effects on ADL - none Any change in general medical condition-none  Current opioids rx- oxycodone  7.5/325 # meds rx- 90 Effectiveness of current meds-helps Adverse reactions from pain meds-none Morphine  equivalent- 33.75  Pill count performed-No Last drug screen - 03/09/21 ( high risk q77m, moderate risk q32m, low risk yearly ) Urine drug screen today- No Was the N[facility] reviewed- yes  If yes were their any concerning findings? - no   Overdose risk: 1    09/15/2020   10:41 AM  Opioid Risk   Alcohol 0  Illegal Drugs 0  Rx Drugs 0  Alcohol 0  Illegal Drugs 0  Rx Drugs 0  Age between 16-45 years  0  History of Preadolescent Sexual Abuse 0  Psychological Disease 0  Depression 0  Opioid Risk Tool Scoring 0  Opioid Risk Interpretation Low Risk      Review of Systems  Cardiovascular:  Positive for chest pain. Negative for palpitations and leg swelling.  Musculoskeletal:  Positive for back pain.       Objective:   Physical Exam Constitutional:      Appearance: Normal appearance. She is obese.  Cardiovascular:     Rate and Rhythm: Normal rate and regular rhythm.     Heart sounds: Normal heart sounds.  Pulmonary:     Effort: Pulmonary effort is normal.     Breath sounds: Normal breath sounds.  Musculoskeletal:     Comments: Gailt slow and steady  (-) SLR bil  Skin:    General: Skin is warm.  Neurological:     General: No focal deficit present.     Mental Status: She is alert and oriented to person, place, and time.  Psychiatric:        Mood  and Affect: Mood normal.        Behavior: Behavior normal.    BP 110/68   Pulse 75   Temp 97.6 F (36.4 C) (Temporal)   Ht 5' (1.524 m)   Wt 161 lb (73 kg)   SpO2 98%   BMI 31.44 kg/m           Assessment & Plan:  [name] in today with chief complaint of pain management  1. Pseudoarthrosis L3- L5 Moist heat Back stretches - ToxASSURE Select 13 (MW), Urine - oxyCODONE -acetaminophen  (PERCOCET) 7.5-325 MG tablet; Take 1 tablet by mouth every 8 (eight) hours as needed for severe pain (pain score 7-10).  Dispense: 90 tablet; Refill: 0 - oxyCODONE -acetaminophen  (PERCOCET) 7.5-325 MG tablet; Take 1 tablet by mouth every 8 (eight) hours as needed for severe pain (pain score 7-10).  Dispense: 90 tablet; Refill: 0 - oxyCODONE -acetaminophen  (PERCOCET) 7.5-325 MG tablet; Take 1 tablet by mouth every 8 (eight) hours as needed for severe pain (pain score 7-10).  Dispense: 90 tablet; Refill: 0    The above assessment and management plan was discussed with the patient. The patient verbalized understanding of and has agreed to the management plan. Patient is aware to call the clinic if symptoms persist or worsen. Patient is  aware when to return to the clinic for a follow-up visit. Patient educated on when it is appropriate to go to the emergency department.   Mary-Margaret Gladis, FNP   "

## 2024-06-06 ENCOUNTER — Telehealth: Payer: Self-pay | Admitting: Nurse Practitioner

## 2024-06-06 NOTE — Telephone Encounter (Signed)
 Called and spoke with patient and she confirmed that pharmacy only gave her 42 tabs of her Rx to last her for 7 days and didn't know why but said she wasn't going to go to the pharmacy every single week to pick up the medicine.  I called the pharmacy and they confirmed that they only gave patient 42 tabs of her Rx because that's all insurance would cover without a Prior Authorization, which they explained to patient and also explained to patient that if she could wait for the Prior Auth to get approved with her insurance, then they could give her the full 90 tabs, but patient did not want to wait.  I tried calling patient back to explain this to her but no answer. Left message.

## 2024-06-06 NOTE — Telephone Encounter (Signed)
 Copied from CRM #[id number]. Topic: Clinical - Prescription Issue >> Jun 06, 2024 12:44 PM Zebedee SAUNDERS wrote: Reason for CRM: Pt stated pharmacy only gave her for 7 days (42 tablets) oxyCODONE -acetaminophen  (PERCOCET) 7.5-325 MG tablet, 90 tablet (15 day supply)

## 2024-06-07 LAB — TOXASSURE SELECT 13 (MW), URINE

## 2024-06-07 NOTE — Telephone Encounter (Signed)
 Please make sure her pain meds get prior auth done so she does not run out of meds  Whitney Gladis, FNP

## 2024-06-07 NOTE — Telephone Encounter (Signed)
 Pharmacy will need new script sent to them for remaining tablets that were not given to patient (total of 48 tabs remaining). Prior Auth not required for this Rx but when her next Rx is due to be filled, it will need a Prior Auth in order for patient to pick up the full 90 tabs. Will forward to PCP.    Copied from CRM #[id number]. Topic: Clinical - Prescription Issue >> Jun 06, 2024 12:44 PM Zebedee SAUNDERS wrote: Reason for CRM: Pt stated pharmacy only gave her for 7 days (42 tablets) oxyCODONE -acetaminophen  (PERCOCET) 7.5-325 MG tablet, 90 tablet (15 day supply) >> Jun 07, 2024 11:16 AM Graeme ORN wrote: Patient called back. Returned missed call from [place]. Let her know that medication requires PA. And if it is approved by insurance she may get the rest filles per chart notes. Patient understood. Sending CRM just to check to make sure PA was submitted. Thank You

## 2024-06-10 ENCOUNTER — Telehealth: Payer: Self-pay

## 2024-06-10 ENCOUNTER — Other Ambulatory Visit (HOSPITAL_COMMUNITY): Payer: Self-pay

## 2024-06-10 DIAGNOSIS — S32009K Unspecified fracture of unspecified lumbar vertebra, subsequent encounter for fracture with nonunion: Secondary | ICD-10-CM

## 2024-06-10 NOTE — Telephone Encounter (Signed)
New encounter started for PA.

## 2024-06-10 NOTE — Telephone Encounter (Signed)
 Pharmacy Patient Advocate Encounter   Received notification from Physician's Office that prior authorization for OXYCODONE -ACETAMINOPHEN  is required/requested.   Insurance verification completed.   The patient is insured through [employer].   Per test claim: QTY OF #90 FOR 15 DAY SUPPLY IS COVERED AND DOESN'T REQUIRE A PRIOR AUTH. COPAY $2.55. MAXIMUM OF 30 DAY SUPPLY ALLOWED.

## 2024-06-12 NOTE — Telephone Encounter (Signed)
 Pharmacy will need new script sent to them for remaining tablets that were not given to patient (total of 48 tabs remaining).   Patient was only able to pick up a 7 day supply on 1/28 because she says per the pharmacy, that's all she could get filled without a prior authorization. So she needs PCP to send a new script for remaining pills that she couldn't get.

## 2024-06-13 ENCOUNTER — Telehealth: Payer: Self-pay | Admitting: Family Medicine

## 2024-06-13 MED ORDER — OXYCODONE-ACETAMINOPHEN 7.5-325 MG PO TABS: 1.0000 | ORAL_TABLET | ORAL | 0 refills | Status: AC | PRN

## 2024-06-13 NOTE — Telephone Encounter (Signed)
Closing encounter, taken care of in another encounter

## 2024-06-13 NOTE — Telephone Encounter (Signed)
 Prescription sent to pharmacy.

## 2024-06-13 NOTE — Telephone Encounter (Signed)
 Called and spoke with patient and made her aware

## 2024-06-13 NOTE — Telephone Encounter (Signed)
 Copied from CRM [id number]. Topic: Clinical - Medication Refill >> Jun 13, 2024 12:16 PM Carrielelia G wrote: Medication: oxyCODONE -acetaminophen  (PERCOCET) 7.5-325 MG tablet  Has the patient contacted their pharmacy? Yes (Agent: If no, request that the patient contact the pharmacy for the refill. If patient does not wish to contact the pharmacy document the reason why and proceed with request.) (Agent: If yes, when and what did the pharmacy advise?)  This is the patient's preferred pharmacy:  [facility] - [place], [place] - [address] [place] [address] Phone: [phone] Fax: [phone]    Is this the correct pharmacy for this prescription? Yes If no, delete pharmacy and type the correct one.    Is the patient out of the medication? No  Has the patient been seen for an appointment in the last year OR does the patient have an upcoming appointment? Yes  Can we respond through MyChart? No  Agent: Please be advised that Rx refills may take up to 3 business days. We ask that you follow-up with your pharmacy.

## 2024-06-13 NOTE — Addendum Note (Signed)
 Addended by: Izayiah Tibbitts, MARY-MARGARET on: 06/13/2024 01:06 PM   Modules accepted: Orders

## 2024-06-14 ENCOUNTER — Other Ambulatory Visit: Payer: Self-pay | Admitting: Nurse Practitioner

## 2024-06-14 DIAGNOSIS — S32009K Unspecified fracture of unspecified lumbar vertebra, subsequent encounter for fracture with nonunion: Secondary | ICD-10-CM

## 2024-06-14 MED ORDER — OXYCODONE-ACETAMINOPHEN 7.5-325 MG PO TABS: 1.0000 | ORAL_TABLET | ORAL | 0 refills | Status: AC | PRN

## 2024-06-28 ENCOUNTER — Ambulatory Visit: Payer: Self-pay

## 2024-08-23 ENCOUNTER — Ambulatory Visit: Payer: Medicare (Managed Care) | Admitting: Nurse Practitioner

## 2024-09-17 ENCOUNTER — Ambulatory Visit: Admitting: Infectious Disease
